# Patient Record
Sex: Male | Born: 1940 | Race: White | Hispanic: No | Smoking: Former smoker
Health system: Southern US, Community
[De-identification: ages and names within clinical notes are randomized; demographics above are authoritative.]

## PROBLEM LIST (undated history)

## (undated) DIAGNOSIS — C649 Malignant neoplasm of unspecified kidney, except renal pelvis: Secondary | ICD-10-CM

## (undated) DIAGNOSIS — I639 Cerebral infarction, unspecified: Secondary | ICD-10-CM

## (undated) DIAGNOSIS — N289 Disorder of kidney and ureter, unspecified: Secondary | ICD-10-CM

## (undated) DIAGNOSIS — N319 Neuromuscular dysfunction of bladder, unspecified: Secondary | ICD-10-CM

## (undated) DIAGNOSIS — I619 Nontraumatic intracerebral hemorrhage, unspecified: Secondary | ICD-10-CM

## (undated) DIAGNOSIS — R4189 Other symptoms and signs involving cognitive functions and awareness: Secondary | ICD-10-CM

## (undated) DIAGNOSIS — I10 Essential (primary) hypertension: Secondary | ICD-10-CM

## (undated) DIAGNOSIS — T8859XA Other complications of anesthesia, initial encounter: Secondary | ICD-10-CM

## (undated) HISTORY — PX: HERNIA REPAIR: SHX51

## (undated) HISTORY — PX: LUMBAR FUSION: SHX111

## (undated) HISTORY — PX: NEPHRECTOMY: SHX65

## (undated) HISTORY — PX: APPENDECTOMY: SHX54

---

## 2003-06-02 DIAGNOSIS — C649 Malignant neoplasm of unspecified kidney, except renal pelvis: Secondary | ICD-10-CM

## 2003-06-02 HISTORY — DX: Malignant neoplasm of unspecified kidney, except renal pelvis: C64.9

## 2003-06-02 HISTORY — PX: NEPHRECTOMY: SHX65

## 2007-01-12 ENCOUNTER — Ambulatory Visit: Payer: Self-pay | Admitting: Unknown Physician Specialty

## 2007-02-10 ENCOUNTER — Other Ambulatory Visit: Payer: Self-pay

## 2007-02-10 ENCOUNTER — Ambulatory Visit: Payer: Self-pay | Admitting: Surgery

## 2007-02-17 ENCOUNTER — Inpatient Hospital Stay: Payer: Self-pay | Admitting: Surgery

## 2007-02-25 ENCOUNTER — Ambulatory Visit: Payer: Self-pay | Admitting: Urology

## 2007-03-08 ENCOUNTER — Ambulatory Visit: Payer: Self-pay | Admitting: Urology

## 2008-03-25 ENCOUNTER — Ambulatory Visit: Payer: Self-pay | Admitting: Family Medicine

## 2009-07-02 ENCOUNTER — Ambulatory Visit: Payer: Self-pay | Admitting: Orthopedic Surgery

## 2009-07-04 ENCOUNTER — Ambulatory Visit: Payer: Self-pay | Admitting: Orthopedic Surgery

## 2009-08-27 ENCOUNTER — Ambulatory Visit: Payer: Self-pay | Admitting: Orthopedic Surgery

## 2009-11-21 ENCOUNTER — Ambulatory Visit: Payer: Self-pay | Admitting: Family Medicine

## 2009-12-11 ENCOUNTER — Ambulatory Visit: Payer: Self-pay | Admitting: Unknown Physician Specialty

## 2009-12-13 ENCOUNTER — Inpatient Hospital Stay: Payer: Self-pay | Admitting: Unknown Physician Specialty

## 2010-01-07 ENCOUNTER — Emergency Department: Payer: Self-pay | Admitting: Emergency Medicine

## 2010-01-13 ENCOUNTER — Emergency Department: Payer: Self-pay | Admitting: Emergency Medicine

## 2010-11-28 IMAGING — CR DG CHEST 1V PORT
1 series · 1 of 1 positions shown · non-contrast
Comparison: none

REASON FOR EXAM: Post-op fever
COMMENTS:

[view not recorded]
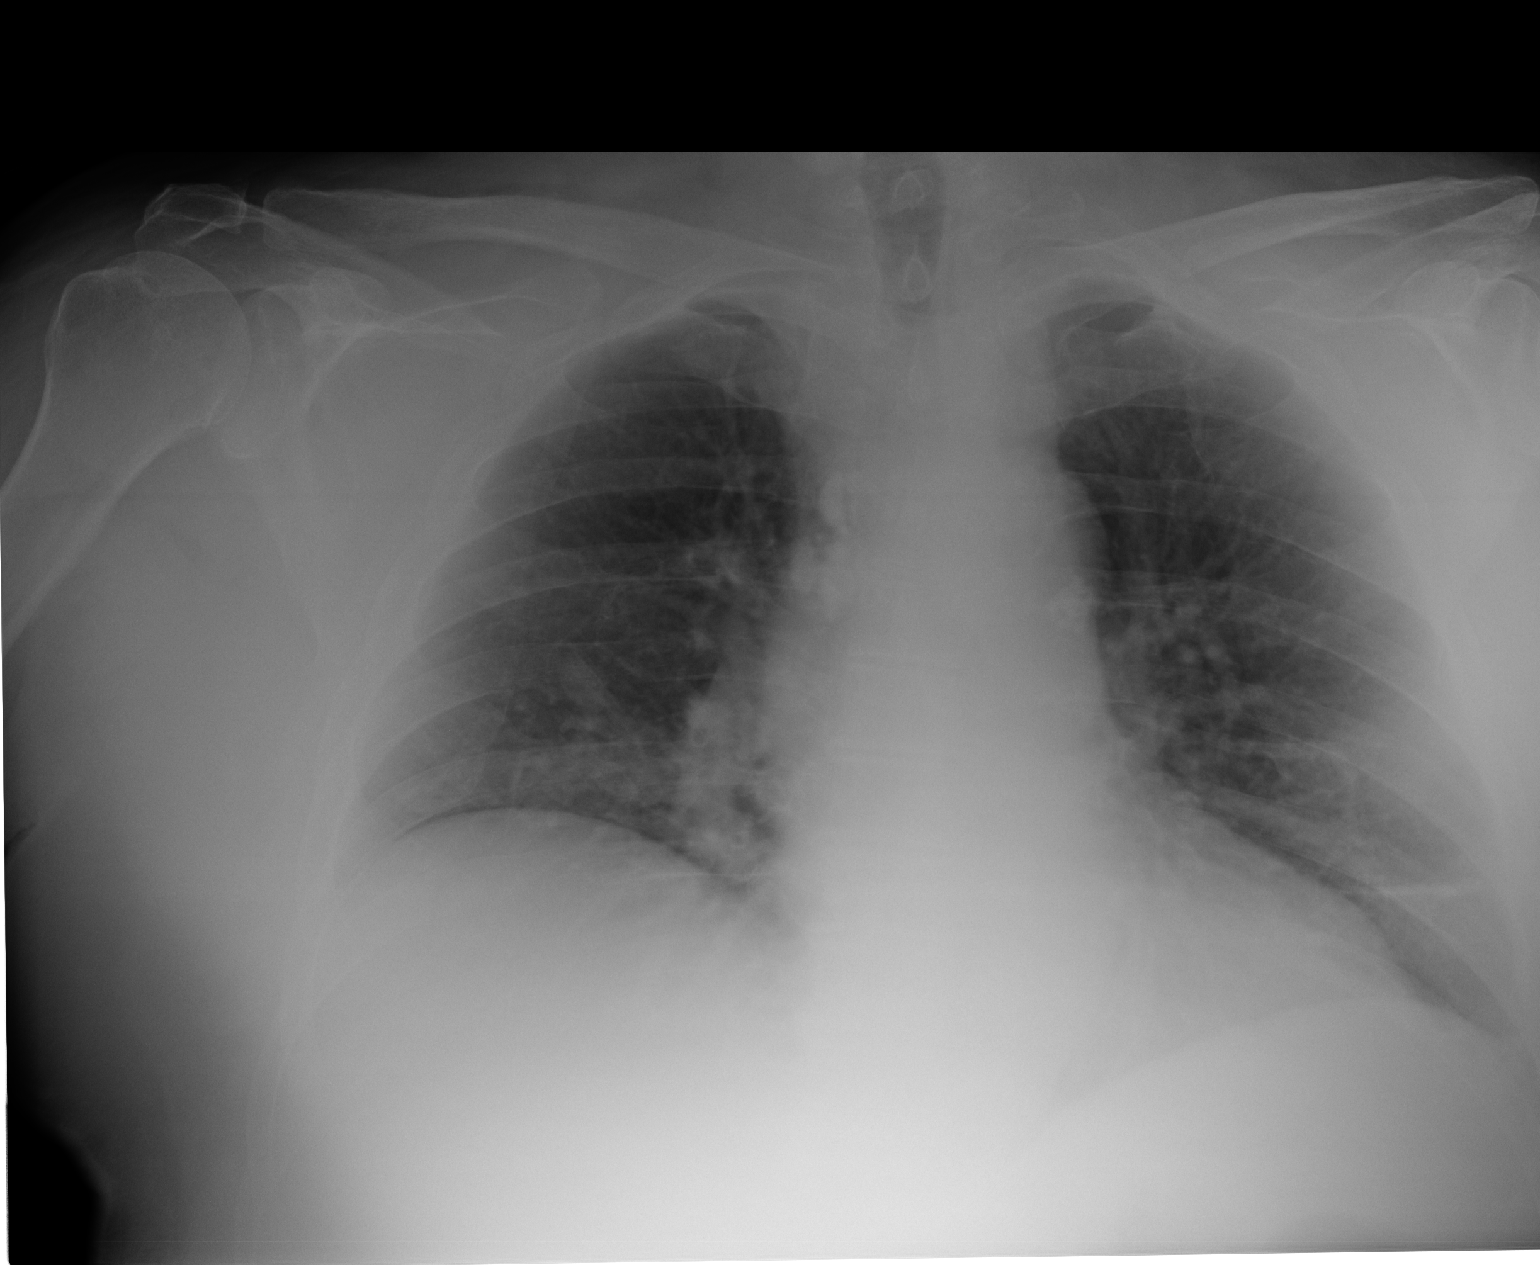

[1 of 1 positions shown; findings below may reference images not displayed]

PROCEDURE:     DXR - DXR PORTABLE CHEST SINGLE VIEW  - December 17, 2009  [DATE]

RESULT:     Comparison is made to a prior exam of 12/16/2009. There is again
noted slight bibasilar atelectasis that appears less prominent than on the
prior exam. No new infiltrates are seen. Calcified lymph nodes are again
noted in the right paratracheal area. No acute changes of the heart or
pulmonary vasculature are noted.
IMPRESSION: 1.  No new pulmonary infiltrates are seen.
2.  There is mild bibasilar atelectasis that appears slightly less prominent
than on the exam of 12/16/2009.

## 2010-11-29 IMAGING — CR DG ABDOMEN 2V
1 series · 4 of 4 positions shown · non-contrast
Comparison: none

REASON FOR EXAM: fever and abd distention
COMMENTS:

[Series 1: view not recorded · 0.17mm/px · 4 of 4 slices shown]
[im 1/4]
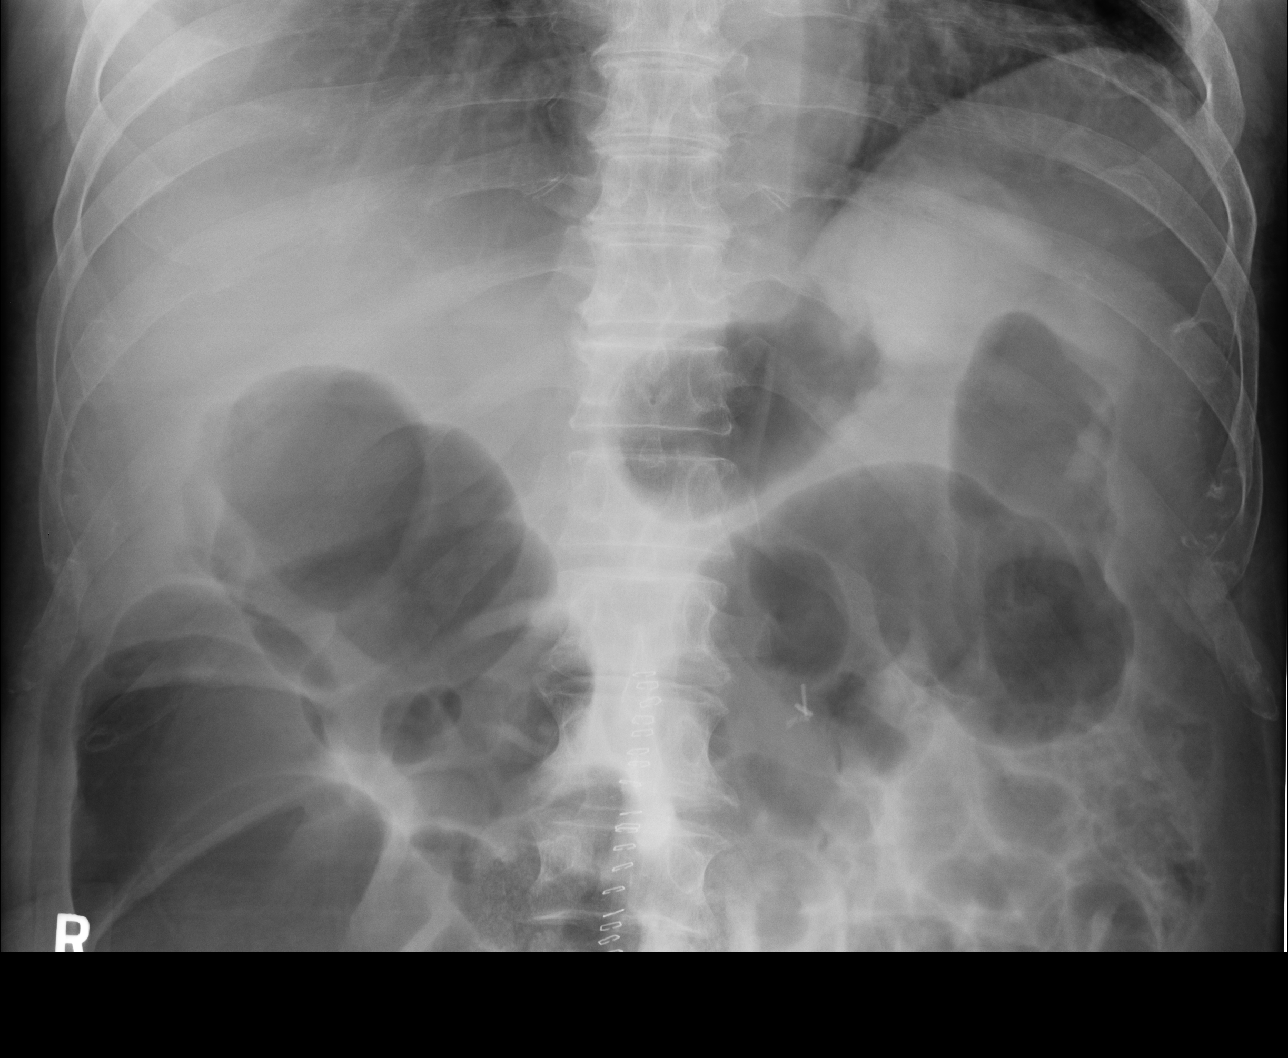
[im 2/4]
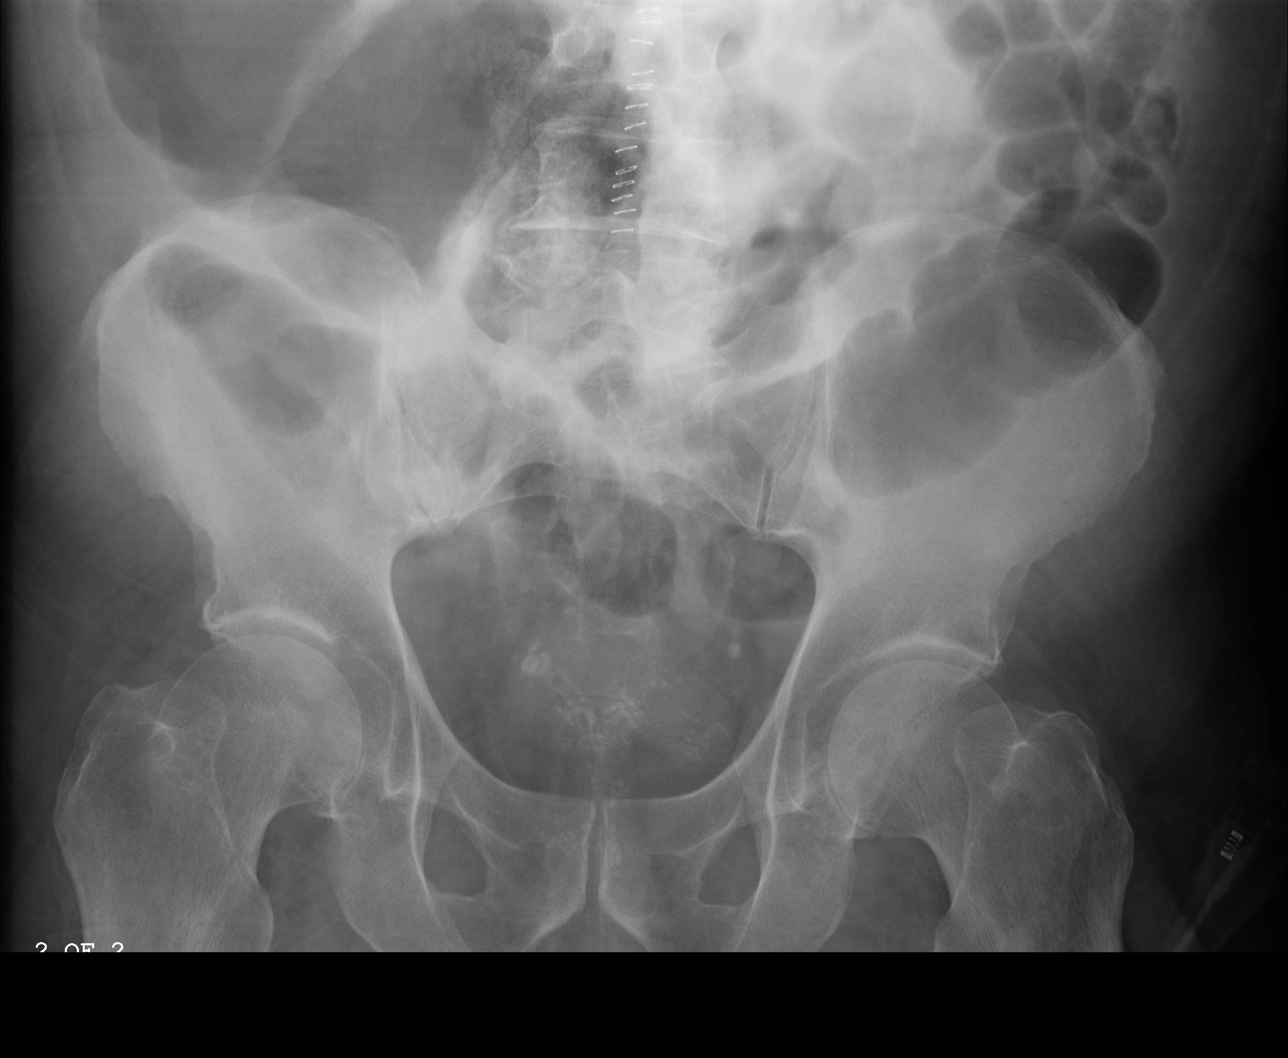
[im 3/4]
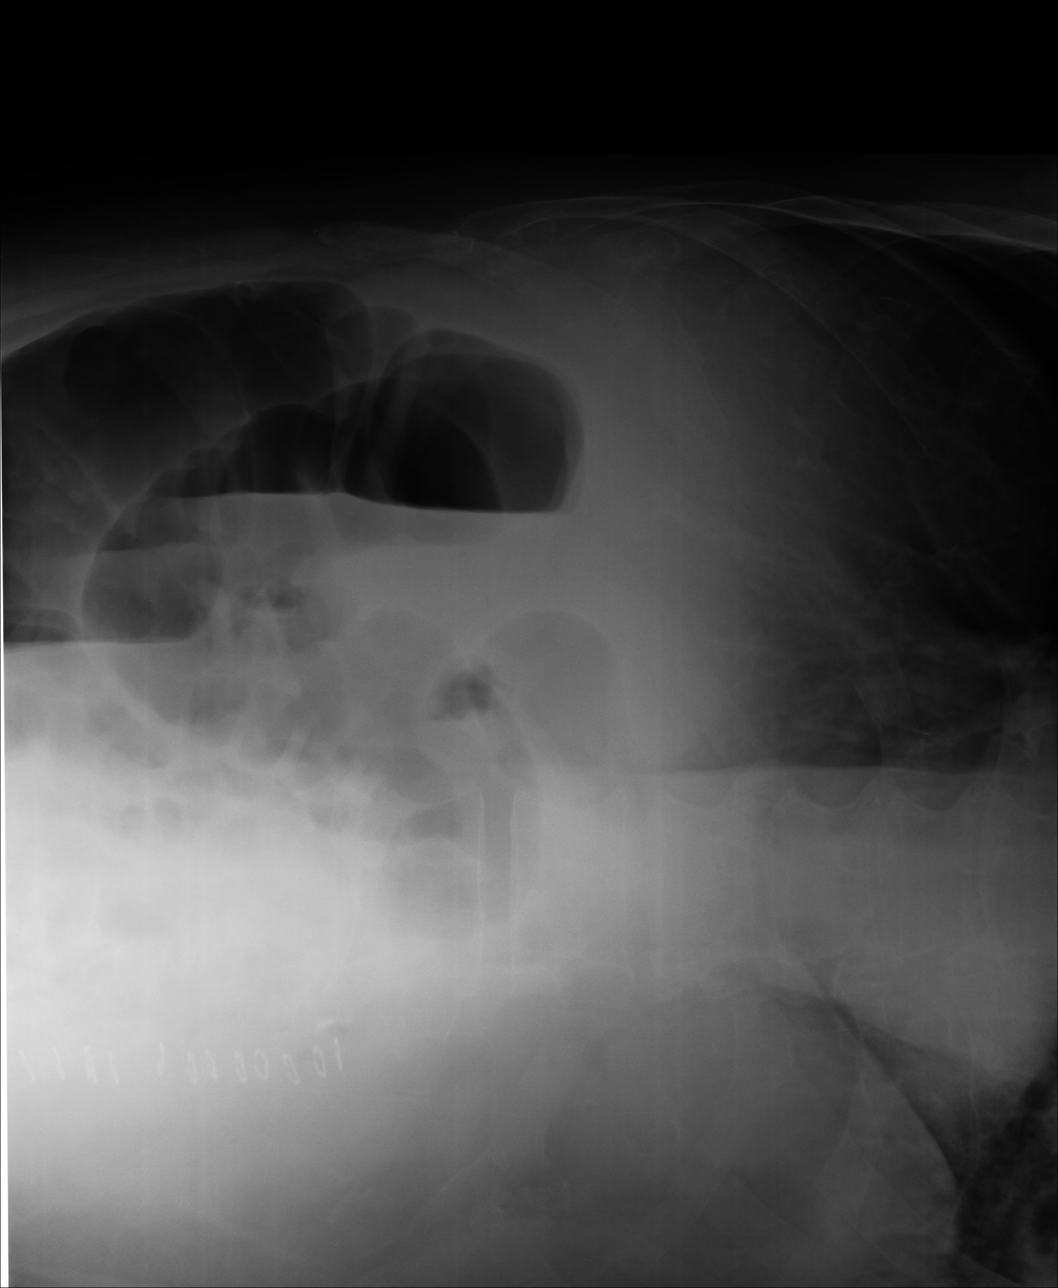
[im 4/4]
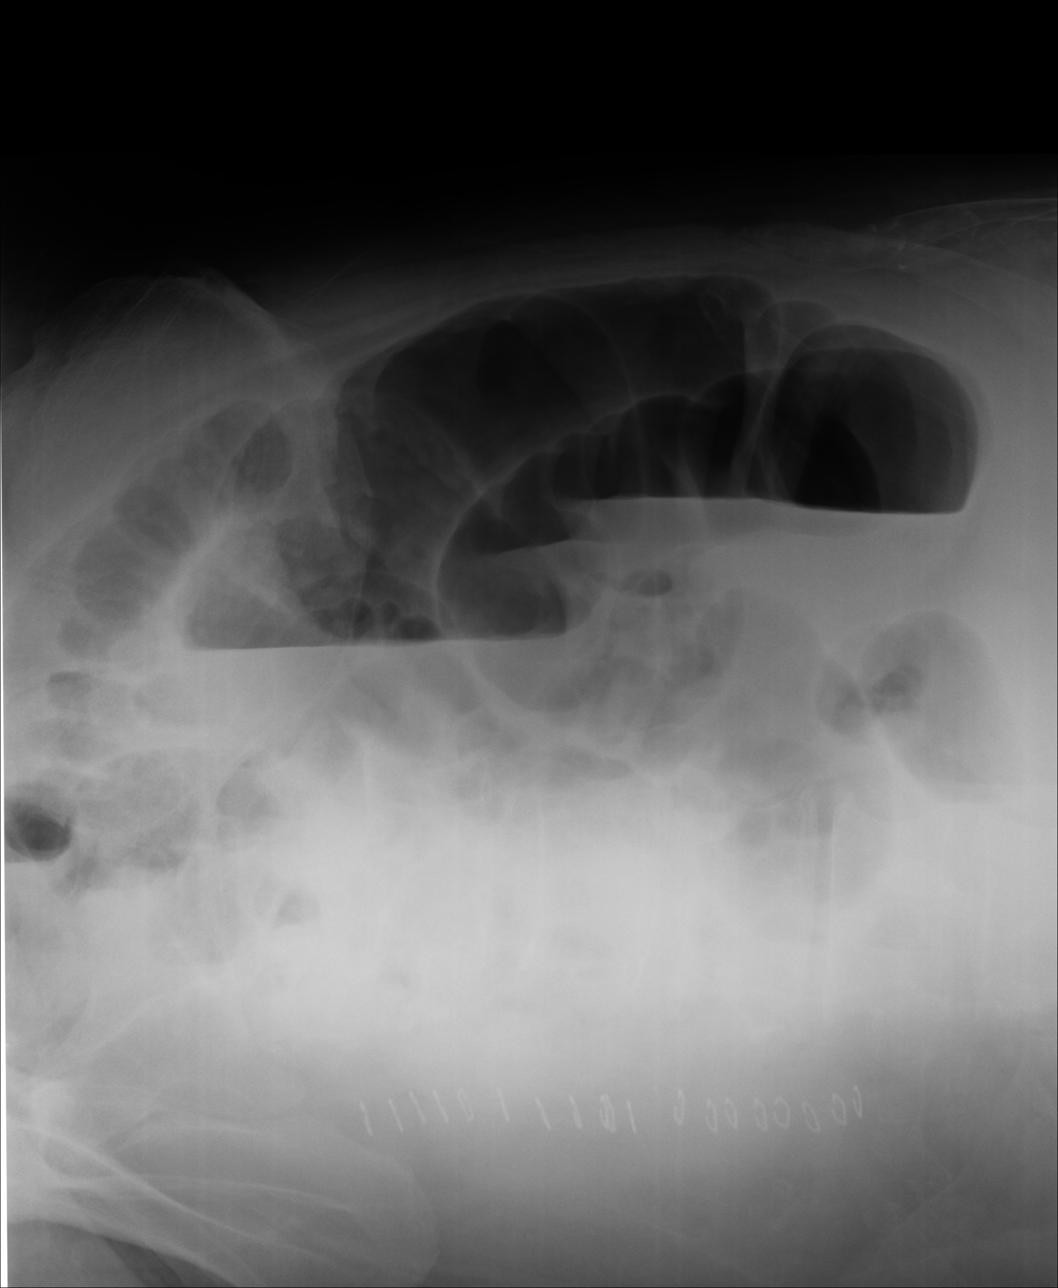

[4 of 4 positions shown; findings below may reference images not displayed]

PROCEDURE:     DXR - DXR ABDOMEN 2 V FLAT AND ERECT  - December 18, 2009  [DATE]

RESULT:     Flat and erect views of the abdomen were obtained. No
subdiaphragmatic free air is seen. There is mild gaseous distention of the
colon with prominent air-fluid levels noted. A few fluid levels are noted in
nondilated small bowel. The findings at this time are most compatible with
an ileus pattern. Postoperative metallic clips are noted in the midline. No
abnormal intraabdominal calcifications are seen.
IMPRESSION: 1. There is colonic dilatation with air-fluid levels present. The findings
are most compatible with an ileus pattern.
2. No subdiaphragmatic free air is seen.

## 2012-10-28 DIAGNOSIS — N390 Urinary tract infection, site not specified: Secondary | ICD-10-CM | POA: Insufficient documentation

## 2013-01-20 DIAGNOSIS — I8390 Asymptomatic varicose veins of unspecified lower extremity: Secondary | ICD-10-CM | POA: Insufficient documentation

## 2013-09-26 DIAGNOSIS — R609 Edema, unspecified: Secondary | ICD-10-CM | POA: Insufficient documentation

## 2013-09-26 DIAGNOSIS — N319 Neuromuscular dysfunction of bladder, unspecified: Secondary | ICD-10-CM | POA: Insufficient documentation

## 2013-09-26 DIAGNOSIS — C649 Malignant neoplasm of unspecified kidney, except renal pelvis: Secondary | ICD-10-CM | POA: Insufficient documentation

## 2013-09-26 DIAGNOSIS — R601 Generalized edema: Secondary | ICD-10-CM | POA: Insufficient documentation

## 2013-09-26 DIAGNOSIS — K649 Unspecified hemorrhoids: Secondary | ICD-10-CM | POA: Insufficient documentation

## 2013-12-26 DIAGNOSIS — M545 Low back pain, unspecified: Secondary | ICD-10-CM | POA: Insufficient documentation

## 2013-12-26 DIAGNOSIS — M4326 Fusion of spine, lumbar region: Secondary | ICD-10-CM | POA: Insufficient documentation

## 2014-04-10 DIAGNOSIS — K592 Neurogenic bowel, not elsewhere classified: Secondary | ICD-10-CM | POA: Insufficient documentation

## 2014-04-10 DIAGNOSIS — K5900 Constipation, unspecified: Secondary | ICD-10-CM | POA: Insufficient documentation

## 2014-04-10 DIAGNOSIS — K439 Ventral hernia without obstruction or gangrene: Secondary | ICD-10-CM | POA: Insufficient documentation

## 2014-07-31 DIAGNOSIS — I10 Essential (primary) hypertension: Secondary | ICD-10-CM | POA: Insufficient documentation

## 2016-01-20 DIAGNOSIS — N390 Urinary tract infection, site not specified: Secondary | ICD-10-CM | POA: Insufficient documentation

## 2017-04-05 ENCOUNTER — Ambulatory Visit (INDEPENDENT_AMBULATORY_CARE_PROVIDER_SITE_OTHER): Payer: Medicare HMO | Admitting: Urology

## 2017-04-05 ENCOUNTER — Encounter: Payer: Self-pay | Admitting: Urology

## 2017-04-05 VITALS — BP 114/76 | HR 109 | Ht 73.0 in | Wt 231.0 lb

## 2017-04-05 DIAGNOSIS — R531 Weakness: Secondary | ICD-10-CM | POA: Insufficient documentation

## 2017-04-05 DIAGNOSIS — R339 Retention of urine, unspecified: Secondary | ICD-10-CM

## 2017-04-05 DIAGNOSIS — N319 Neuromuscular dysfunction of bladder, unspecified: Secondary | ICD-10-CM

## 2017-04-05 DIAGNOSIS — N189 Chronic kidney disease, unspecified: Secondary | ICD-10-CM | POA: Insufficient documentation

## 2017-04-05 DIAGNOSIS — K219 Gastro-esophageal reflux disease without esophagitis: Secondary | ICD-10-CM | POA: Insufficient documentation

## 2017-04-05 DIAGNOSIS — R52 Pain, unspecified: Secondary | ICD-10-CM | POA: Insufficient documentation

## 2017-04-07 ENCOUNTER — Encounter: Payer: Self-pay | Admitting: Urology

## 2017-04-07 ENCOUNTER — Ambulatory Visit (INDEPENDENT_AMBULATORY_CARE_PROVIDER_SITE_OTHER): Payer: Medicare HMO | Admitting: Urology

## 2017-04-07 VITALS — BP 112/76 | HR 96 | Ht 73.0 in

## 2017-04-07 DIAGNOSIS — N319 Neuromuscular dysfunction of bladder, unspecified: Secondary | ICD-10-CM | POA: Diagnosis not present

## 2017-04-07 MED ORDER — CIPROFLOXACIN HCL 500 MG PO TABS
500.0000 mg | ORAL_TABLET | Freq: Once | ORAL | Status: AC
  Administered 2017-04-07: 500 mg via ORAL

## 2017-04-07 MED ORDER — LIDOCAINE HCL 2 % EX GEL
1.0000 "application " | Freq: Once | CUTANEOUS | Status: AC
  Administered 2017-04-07: 1 via URETHRAL

## 2017-04-07 NOTE — Progress Notes (Signed)
04/05/2017 8:32 AM   Adam Rios Feb 02, 1941 [id number]  Referring provider: No referring provider defined for this encounter.  Chief Complaint  Patient presents with  . Self Cath Trauma    HPI: Adam Rios is a 76 year old male who presents with a history of self cath trauma.  He was admitted to [facility] on 03/08/2017 for hypoxia.  He has a history of neurogenic bladder after spinal surgery several years ago and has been on intermittent catheterization for several years.  Review of his [facility] notes indicate he was having difficulty with self-catheterization and having urethral bleeding.  A Foley was placed during that hospitalization and he was discharged with an indwelling Foley.  The patient states in/out catheterization was attempted when in the ED with a straight cath and caused trauma and bleeding.  He states he typically catheterizes with a coud catheter.  He is status post a nephrectomy many years ago for renal cell carcinoma.  A CT of the abdomen and pelvis performed August 2017 showed a surgically absent left kidney and no calculi or hydronephrosis.   PMH: History reviewed. No pertinent past medical history.  Surgical History: History reviewed. No pertinent surgical history.  Home Medications:  Allergies as of 04/05/2017      Reactions   Fentanyl Nausea Only      Medication List        Accurate as of 04/05/17 11:59 PM. Always use your most recent med list.          acetaminophen 325 MG tablet Commonly known as:  TYLENOL Take 650 mg by mouth.   amLODipine 10 MG tablet Commonly known as:  NORVASC   aspirin EC 81 MG tablet Take 81 mg by mouth.   ciprofloxacin 500 MG tablet Commonly known as:  CIPRO Take 500 mg 2 (two) times daily by mouth.   guaiFENesin 100 MG/5ML Soln Commonly known as:  ROBITUSSIN Take 5 mLs every 4 (four) hours as needed by mouth for cough or to loosen phlegm.   losartan 25 MG tablet Commonly known as:  COZAAR Take 25 mg daily by  mouth.   pantoprazole 40 MG tablet Commonly known as:  PROTONIX Take 40 mg daily by mouth.   POLYETHYLENE GLYCOL 3350 PO Take by mouth.   senna 8.6 MG tablet Commonly known as:  SENOKOT Take by mouth.       Allergies:  Allergies  Allergen Reactions  . Fentanyl Nausea Only    Family History: History reviewed. No pertinent family history.  Social History:  reports that he has quit smoking. His smoking use included cigarettes. He has a 2.50 pack-year smoking history. He does not have any smokeless tobacco history on file. His alcohol and drug histories are not on file.  ROS: UROLOGY Frequent Urination?: No Hard to postpone urination?: No Burning/pain with urination?: No Get up at night to urinate?: No Leakage of urine?: No Urine stream starts and stops?: No Trouble starting stream?: No Do you have to strain to urinate?: No Blood in urine?: Yes Urinary tract infection?: Yes Sexually transmitted disease?: No Injury to kidneys or bladder?: No Painful intercourse?: No Weak stream?: No Erection problems?: Yes Penile pain?: Yes  Gastrointestinal Nausea?: No Vomiting?: No Indigestion/heartburn?: No Diarrhea?: No Constipation?: Yes  Constitutional Fever: No Night sweats?: No Weight loss?: No Fatigue?: No  Skin Skin rash/lesions?: No Itching?: No  Eyes Blurred vision?: No Double vision?: No  Ears/Nose/Throat Sore throat?: No Sinus problems?: No  Hematologic/Lymphatic Swollen glands?: No Easy bruising?:  No  Cardiovascular Leg swelling?: No Chest pain?: No  Respiratory Cough?: No Shortness of breath?: No  Endocrine Excessive thirst?: No  Musculoskeletal Back pain?: Yes Joint pain?: Yes  Neurological Headaches?: Yes Dizziness?: No  Psychologic Depression?: No Anxiety?: No  Physical Exam: BP 114/76   Pulse (!) 109   Ht 6\' 1"  (1.854 m) Comment: 03/26/2017 per nursing facility paperwork  Wt 231 lb (104.8 kg) Comment: 03/26/2017 per  nursing facility paperwork  BMI 30.48 kg/m   Constitutional:  Alert and oriented, No acute distress. HEENT: [place] AT, moist mucus membranes.  Trachea midline, no masses. Cardiovascular: No clubbing, cyanosis, or edema. Respiratory: Normal respiratory effort, no increased work of breathing. GI: Abdomen is soft, nontender, nondistended, no abdominal masses GU: No CVA tenderness. Skin: No rashes, bruises or suspicious lesions. Lymph: No cervical or inguinal adenopathy. Neurologic: Grossly intact, no focal deficits, moving all 4 extremities. Psychiatric: Normal mood and affect.   Assessment & Plan:    Neurogenic bladder on intermittent catheterization with recent trauma on in/out catheterization.  He will return for cystoscopy to evaluate for false passage.  If no significant abnormalities will discontinue the Foley and he may resume intermittent catheterization.   Abbie Sons, [place] [address], [place], [place] [address] [phone]

## 2017-04-07 NOTE — Progress Notes (Signed)
   04/07/17  CC:  Chief Complaint  Patient presents with  . Cysto    HPI: Refer to office note 04/05/2017  Blood pressure 112/76, pulse 96, height 6\' 1"  (1.854 m).   Cystoscopy Procedure Note  Patient identification was confirmed, informed consent was obtained, and patient was prepped using Betadine solution.  Lidocaine jelly was administered per urethral meatus.    Preoperative abx where received prior to procedure.     Pre-Procedure: - Inspection reveals a normal caliber ureteral meatus.  Procedure: The flexible cystoscope was introduced without difficulty - No urethral strictures/lesions are present.  No false passage noted - Coapting lateral lobes prostate with a prostatic urethral length with > 5cm. - Elevated bladder neck with an intravesical median lobe - Bilateral ureteral orifices identified - Bladder mucosa  reveals inflammatory change bladder base due to indwelling Foley.  No solid or papillary lesions noted - No bladder stones   Retroflexion shows intravesical median lobe   Post-Procedure: - Patient tolerated the procedure well  Assessment/ Plan:  He was unable to self cath with a 14 [place] coud.  A 16 [place] coud catheter was reinserted without difficulty.  He states he has been on intermittent catheterization for greater than 10 years apparently due to neurogenic bladder after lumbar spinal surgery.  Follow-up 1 week for cath removal and reattempt at self catheterization.

## 2017-04-14 ENCOUNTER — Ambulatory Visit: Payer: Medicare HMO

## 2017-04-15 ENCOUNTER — Ambulatory Visit (INDEPENDENT_AMBULATORY_CARE_PROVIDER_SITE_OTHER): Payer: Medicare HMO

## 2017-04-15 VITALS — BP 148/87 | HR 102 | Ht 73.0 in | Wt 233.0 lb

## 2017-04-15 DIAGNOSIS — N319 Neuromuscular dysfunction of bladder, unspecified: Secondary | ICD-10-CM | POA: Diagnosis not present

## 2017-04-15 NOTE — Progress Notes (Signed)
Pt presented today to have foley removed. Pt tolerated well. Pt has been doing CIC for several years. A false passage was created resulting in foley placement. Coloplast flex pro was given to pt to allow CIC with a coude tip to be easier. Pt demonstrated use of flex pro. Pt tolerated well. Samples were given. Pt will call if he would like an order of flex pro.  Blood pressure (!) 148/87, pulse (!) 102, height 6\' 1"  (1.854 m), weight 233 lb (105.7 kg).

## 2017-04-19 ENCOUNTER — Ambulatory Visit: Payer: Medicare HMO

## 2018-01-29 ENCOUNTER — Inpatient Hospital Stay
Admission: EM | Admit: 2018-01-29 | Discharge: 2018-02-04 | DRG: 698 | Disposition: A | Discharge status: Skilled Nursing Facility | Payer: Medicare HMO | Source: Skilled Nursing Facility | Attending: Internal Medicine | Admitting: Internal Medicine

## 2018-01-29 ENCOUNTER — Emergency Department: Payer: Medicare HMO

## 2018-01-29 ENCOUNTER — Other Ambulatory Visit: Payer: Self-pay

## 2018-01-29 ENCOUNTER — Encounter: Payer: Self-pay | Admitting: Emergency Medicine

## 2018-01-29 DIAGNOSIS — Y846 Urinary catheterization as the cause of abnormal reaction of the patient, or of later complication, without mention of misadventure at the time of the procedure: Secondary | ICD-10-CM | POA: Diagnosis present

## 2018-01-29 DIAGNOSIS — A0472 Enterocolitis due to Clostridium difficile, not specified as recurrent: Secondary | ICD-10-CM | POA: Diagnosis present

## 2018-01-29 DIAGNOSIS — I10 Essential (primary) hypertension: Secondary | ICD-10-CM | POA: Diagnosis present

## 2018-01-29 DIAGNOSIS — T83511A Infection and inflammatory reaction due to indwelling urethral catheter, initial encounter: Secondary | ICD-10-CM

## 2018-01-29 DIAGNOSIS — Z8673 Personal history of transient ischemic attack (TIA), and cerebral infarction without residual deficits: Secondary | ICD-10-CM

## 2018-01-29 DIAGNOSIS — Z905 Acquired absence of kidney: Secondary | ICD-10-CM

## 2018-01-29 DIAGNOSIS — T83518A Infection and inflammatory reaction due to other urinary catheter, initial encounter: Principal | ICD-10-CM | POA: Diagnosis present

## 2018-01-29 DIAGNOSIS — Z85528 Personal history of other malignant neoplasm of kidney: Secondary | ICD-10-CM

## 2018-01-29 DIAGNOSIS — N390 Urinary tract infection, site not specified: Secondary | ICD-10-CM | POA: Diagnosis present

## 2018-01-29 DIAGNOSIS — R1310 Dysphagia, unspecified: Secondary | ICD-10-CM | POA: Diagnosis present

## 2018-01-29 DIAGNOSIS — Z66 Do not resuscitate: Secondary | ICD-10-CM | POA: Diagnosis present

## 2018-01-29 DIAGNOSIS — N319 Neuromuscular dysfunction of bladder, unspecified: Secondary | ICD-10-CM | POA: Diagnosis present

## 2018-01-29 DIAGNOSIS — R4182 Altered mental status, unspecified: Secondary | ICD-10-CM

## 2018-01-29 DIAGNOSIS — G9340 Encephalopathy, unspecified: Secondary | ICD-10-CM | POA: Diagnosis present

## 2018-01-29 DIAGNOSIS — Z87891 Personal history of nicotine dependence: Secondary | ICD-10-CM

## 2018-01-29 DIAGNOSIS — Z79899 Other long term (current) drug therapy: Secondary | ICD-10-CM

## 2018-01-29 DIAGNOSIS — I611 Nontraumatic intracerebral hemorrhage in hemisphere, cortical: Secondary | ICD-10-CM | POA: Diagnosis present

## 2018-01-29 DIAGNOSIS — A419 Sepsis, unspecified organism: Secondary | ICD-10-CM | POA: Diagnosis present

## 2018-01-29 DIAGNOSIS — K219 Gastro-esophageal reflux disease without esophagitis: Secondary | ICD-10-CM | POA: Diagnosis present

## 2018-01-29 DIAGNOSIS — B9689 Other specified bacterial agents as the cause of diseases classified elsewhere: Secondary | ICD-10-CM | POA: Diagnosis present

## 2018-01-29 DIAGNOSIS — G9341 Metabolic encephalopathy: Secondary | ICD-10-CM | POA: Diagnosis present

## 2018-01-29 HISTORY — DX: Nontraumatic intracerebral hemorrhage, unspecified: I61.9

## 2018-01-29 HISTORY — DX: Essential (primary) hypertension: I10

## 2018-01-29 HISTORY — DX: Other symptoms and signs involving cognitive functions and awareness: R41.89

## 2018-01-29 HISTORY — DX: Disorder of kidney and ureter, unspecified: N28.9

## 2018-01-29 HISTORY — DX: Neuromuscular dysfunction of bladder, unspecified: N31.9

## 2018-01-29 HISTORY — DX: Cerebral infarction, unspecified: I63.9

## 2018-01-29 HISTORY — DX: Malignant neoplasm of unspecified kidney, except renal pelvis: C64.9

## 2018-01-29 LAB — COMPREHENSIVE METABOLIC PANEL
ALK PHOS: 55 U/L (ref 38–126)
ALT: 22 U/L (ref 0–44)
ANION GAP: 9 (ref 5–15)
AST: 18 U/L (ref 15–41)
Albumin: 3.7 g/dL (ref 3.5–5.0)
BILIRUBIN TOTAL: 1.1 mg/dL (ref 0.3–1.2)
BUN: 29 mg/dL — AB (ref 8–23)
CALCIUM: 8.9 mg/dL (ref 8.9–10.3)
CO2: 21 mmol/L — ABNORMAL LOW (ref 22–32)
Chloride: 102 mmol/L (ref 98–111)
Creatinine, Ser: 1.03 mg/dL (ref 0.61–1.24)
GFR calc Af Amer: >60 mL/min (ref >60)
Glucose, Bld: 109 mg/dL — ABNORMAL HIGH (ref 70–99)
POTASSIUM: 4.2 mmol/L (ref 3.5–5.1)
Sodium: 132 mmol/L — ABNORMAL LOW (ref 135–145)
TOTAL PROTEIN: 7.3 g/dL (ref 6.5–8.1)

## 2018-01-29 LAB — CBC
HEMATOCRIT: 45.3 % (ref 40.0–52.0)
Hemoglobin: 15.2 g/dL (ref 13.0–18.0)
MCH: 29.2 pg (ref 26.0–34.0)
MCHC: 33.6 g/dL (ref 32.0–36.0)
MCV: 86.8 fL (ref 80.0–100.0)
Platelets: 286 10*3/uL (ref 150–440)
RBC: 5.21 MIL/uL (ref 4.40–5.90)
RDW: 14 % (ref 11.5–14.5)
WBC: 21.5 10*3/uL — ABNORMAL HIGH (ref 3.8–10.6)

## 2018-01-29 LAB — DIFFERENTIAL
BASOS PCT: 0 %
Basophils Absolute: 0.1 10*3/uL (ref 0–0.1)
EOS ABS: 0.1 10*3/uL (ref 0–0.7)
EOS PCT: 1 %
LYMPHS ABS: 2.4 10*3/uL (ref 1.0–3.6)
Lymphocytes Relative: 11 %
MONO ABS: 1.8 10*3/uL — AB (ref 0.2–1.0)
MONOS PCT: 8 %
Neutro Abs: 17.1 10*3/uL — ABNORMAL HIGH (ref 1.4–6.5)
Neutrophils Relative %: 80 %

## 2018-01-29 LAB — PROTIME-INR
INR: 1.08
Prothrombin Time: 13.9 seconds (ref 11.4–15.2)

## 2018-01-29 LAB — APTT: APTT: 41 s — AB (ref 24–36)

## 2018-01-29 LAB — TROPONIN I: TROPONIN I: 0.03 ng/mL — AB (ref <0.03)

## 2018-01-29 NOTE — ED Triage Notes (Signed)
EMS pt to rm 25 from [facility] with report of AMS over the last 3 days. Pt has a stroke a few weeks ago.

## 2018-01-30 ENCOUNTER — Other Ambulatory Visit: Payer: Self-pay

## 2018-01-30 ENCOUNTER — Encounter: Payer: Self-pay | Admitting: Emergency Medicine

## 2018-01-30 DIAGNOSIS — Z8673 Personal history of transient ischemic attack (TIA), and cerebral infarction without residual deficits: Secondary | ICD-10-CM | POA: Diagnosis not present

## 2018-01-30 DIAGNOSIS — R310 Gross hematuria: Secondary | ICD-10-CM | POA: Diagnosis not present

## 2018-01-30 DIAGNOSIS — B9689 Other specified bacterial agents as the cause of diseases classified elsewhere: Secondary | ICD-10-CM | POA: Diagnosis present

## 2018-01-30 DIAGNOSIS — R338 Other retention of urine: Secondary | ICD-10-CM

## 2018-01-30 DIAGNOSIS — R1310 Dysphagia, unspecified: Secondary | ICD-10-CM | POA: Diagnosis present

## 2018-01-30 DIAGNOSIS — N390 Urinary tract infection, site not specified: Secondary | ICD-10-CM | POA: Diagnosis present

## 2018-01-30 DIAGNOSIS — Z87891 Personal history of nicotine dependence: Secondary | ICD-10-CM | POA: Diagnosis not present

## 2018-01-30 DIAGNOSIS — K219 Gastro-esophageal reflux disease without esophagitis: Secondary | ICD-10-CM | POA: Diagnosis present

## 2018-01-30 DIAGNOSIS — N319 Neuromuscular dysfunction of bladder, unspecified: Secondary | ICD-10-CM | POA: Diagnosis not present

## 2018-01-30 DIAGNOSIS — Z85528 Personal history of other malignant neoplasm of kidney: Secondary | ICD-10-CM | POA: Diagnosis not present

## 2018-01-30 DIAGNOSIS — Z905 Acquired absence of kidney: Secondary | ICD-10-CM | POA: Diagnosis not present

## 2018-01-30 DIAGNOSIS — G9340 Encephalopathy, unspecified: Secondary | ICD-10-CM | POA: Diagnosis present

## 2018-01-30 DIAGNOSIS — Z79899 Other long term (current) drug therapy: Secondary | ICD-10-CM | POA: Diagnosis not present

## 2018-01-30 DIAGNOSIS — A0472 Enterocolitis due to Clostridium difficile, not specified as recurrent: Secondary | ICD-10-CM | POA: Diagnosis present

## 2018-01-30 DIAGNOSIS — I611 Nontraumatic intracerebral hemorrhage in hemisphere, cortical: Secondary | ICD-10-CM | POA: Diagnosis present

## 2018-01-30 DIAGNOSIS — R4182 Altered mental status, unspecified: Secondary | ICD-10-CM | POA: Diagnosis not present

## 2018-01-30 DIAGNOSIS — Z66 Do not resuscitate: Secondary | ICD-10-CM | POA: Diagnosis present

## 2018-01-30 DIAGNOSIS — A419 Sepsis, unspecified organism: Secondary | ICD-10-CM | POA: Diagnosis present

## 2018-01-30 DIAGNOSIS — G9341 Metabolic encephalopathy: Secondary | ICD-10-CM | POA: Diagnosis present

## 2018-01-30 DIAGNOSIS — Y846 Urinary catheterization as the cause of abnormal reaction of the patient, or of later complication, without mention of misadventure at the time of the procedure: Secondary | ICD-10-CM | POA: Diagnosis present

## 2018-01-30 DIAGNOSIS — I10 Essential (primary) hypertension: Secondary | ICD-10-CM | POA: Diagnosis present

## 2018-01-30 DIAGNOSIS — T83518A Infection and inflammatory reaction due to other urinary catheter, initial encounter: Secondary | ICD-10-CM | POA: Diagnosis present

## 2018-01-30 LAB — MRSA PCR SCREENING: MRSA by PCR: POSITIVE — AB

## 2018-01-30 LAB — BASIC METABOLIC PANEL
ANION GAP: 9 (ref 5–15)
BUN: 29 mg/dL — ABNORMAL HIGH (ref 8–23)
CHLORIDE: 102 mmol/L (ref 98–111)
CO2: 23 mmol/L (ref 22–32)
Calcium: 8.7 mg/dL — ABNORMAL LOW (ref 8.9–10.3)
Creatinine, Ser: 1.01 mg/dL (ref 0.61–1.24)
GFR calc Af Amer: >60 mL/min (ref >60)
GFR calc non Af Amer: >60 mL/min (ref >60)
GLUCOSE: 104 mg/dL — AB (ref 70–99)
POTASSIUM: 3.9 mmol/L (ref 3.5–5.1)
Sodium: 134 mmol/L — ABNORMAL LOW (ref 135–145)

## 2018-01-30 LAB — CBC
HEMATOCRIT: 43.8 % (ref 40.0–52.0)
HEMOGLOBIN: 15.1 g/dL (ref 13.0–18.0)
MCH: 29.8 pg (ref 26.0–34.0)
MCHC: 34.3 g/dL (ref 32.0–36.0)
MCV: 86.7 fL (ref 80.0–100.0)
PLATELETS: 251 10*3/uL (ref 150–440)
RBC: 5.06 MIL/uL (ref 4.40–5.90)
RDW: 13.9 % (ref 11.5–14.5)
WBC: 18.1 10*3/uL — ABNORMAL HIGH (ref 3.8–10.6)

## 2018-01-30 LAB — URINALYSIS, COMPLETE (UACMP) WITH MICROSCOPIC
BILIRUBIN URINE: NEGATIVE
Glucose, UA: NEGATIVE mg/dL
Ketones, ur: NEGATIVE mg/dL
Nitrite: NEGATIVE
PH: 5 (ref 5.0–8.0)
Protein, ur: 30 mg/dL — AB
RBC / HPF: >50 RBC/hpf — ABNORMAL HIGH (ref 0–5)
SPECIFIC GRAVITY, URINE: 1.023 (ref 1.005–1.030)

## 2018-01-30 LAB — LACTIC ACID, PLASMA: Lactic Acid, Venous: 1 mmol/L (ref 0.5–1.9)

## 2018-01-30 LAB — GLUCOSE, CAPILLARY: Glucose-Capillary: 94 mg/dL (ref 70–99)

## 2018-01-30 MED ORDER — CHLORHEXIDINE GLUCONATE CLOTH 2 % EX PADS
6.0000 | MEDICATED_PAD | Freq: Every day | CUTANEOUS | Status: AC
  Administered 2018-01-31: 6 via TOPICAL

## 2018-01-30 MED ORDER — LOSARTAN POTASSIUM 25 MG PO TABS
50.00 | ORAL_TABLET | ORAL | Status: DC
Start: 2018-01-28 — End: 2018-01-30

## 2018-01-30 MED ORDER — ACETAMINOPHEN 325 MG PO TABS
650.0000 mg | ORAL_TABLET | Freq: Four times a day (QID) | ORAL | Status: DC | PRN
  Administered 2018-02-03: 650 mg via ORAL
  Filled 2018-01-30: qty 2

## 2018-01-30 MED ORDER — BISACODYL 5 MG PO TBEC: 5.0000 mg | DELAYED_RELEASE_TABLET | Freq: Every day | ORAL | Status: DC | PRN

## 2018-01-30 MED ORDER — TIZANIDINE HCL 2 MG PO TABS
2.00 | ORAL_TABLET | ORAL | Status: DC
Start: ? — End: 2018-01-30

## 2018-01-30 MED ORDER — SODIUM CHLORIDE 0.9 % IV SOLN
1.0000 g | Freq: Two times a day (BID) | INTRAVENOUS | Status: DC
  Administered 2018-01-30 – 2018-01-31 (×3): 1 g via INTRAVENOUS
  Filled 2018-01-30 (×5): qty 1

## 2018-01-30 MED ORDER — AMLODIPINE BESYLATE 10 MG PO TABS
10.0000 mg | ORAL_TABLET | Freq: Every day | ORAL | Status: DC
  Administered 2018-01-31 – 2018-02-04 (×5): 10 mg via ORAL
  Filled 2018-01-30 (×5): qty 1

## 2018-01-30 MED ORDER — LOSARTAN POTASSIUM 50 MG PO TABS
50.0000 mg | ORAL_TABLET | Freq: Every day | ORAL | Status: DC
  Administered 2018-01-31 – 2018-02-04 (×5): 50 mg via ORAL
  Filled 2018-01-30 (×5): qty 1

## 2018-01-30 MED ORDER — SODIUM CHLORIDE 0.9 % IV SOLN
2.0000 g | Freq: Once | INTRAVENOUS | Status: AC
  Administered 2018-01-30: 2 g via INTRAVENOUS
  Filled 2018-01-30: qty 2

## 2018-01-30 MED ORDER — SODIUM CHLORIDE 0.9 % IV SOLN
Freq: Once | INTRAVENOUS | Status: AC
  Administered 2018-01-30: 04:00:00 via INTRAVENOUS

## 2018-01-30 MED ORDER — GUAIFENESIN 100 MG/5ML PO SYRP
200.00 | ORAL_SOLUTION | ORAL | Status: DC
Start: ? — End: 2018-01-30

## 2018-01-30 MED ORDER — MUPIROCIN 2 % EX OINT
1.0000 "application " | TOPICAL_OINTMENT | Freq: Two times a day (BID) | CUTANEOUS | Status: AC
  Administered 2018-01-30 – 2018-02-03 (×10): 1 via NASAL
  Filled 2018-01-30: qty 22

## 2018-01-30 MED ORDER — OXYBUTYNIN CHLORIDE 5 MG PO TABS
5.00 | ORAL_TABLET | ORAL | Status: DC
Start: 2018-01-27 — End: 2018-01-30

## 2018-01-30 MED ORDER — POLYVINYL ALCOHOL 1.4 % OP SOLN
1.0000 [drp] | Freq: Four times a day (QID) | OPHTHALMIC | Status: DC | PRN
  Filled 2018-01-30: qty 15

## 2018-01-30 MED ORDER — TRAZODONE HCL 50 MG PO TABS
25.0000 mg | ORAL_TABLET | Freq: Every evening | ORAL | Status: DC | PRN
  Administered 2018-02-03: 25 mg via ORAL
  Filled 2018-01-30 (×2): qty 1

## 2018-01-30 MED ORDER — ONDANSETRON HCL 4 MG PO TABS: 4.0000 mg | ORAL_TABLET | Freq: Four times a day (QID) | ORAL | Status: DC | PRN

## 2018-01-30 MED ORDER — ACETAMINOPHEN 650 MG RE SUPP: 650.0000 mg | Freq: Four times a day (QID) | RECTAL | Status: DC | PRN

## 2018-01-30 MED ORDER — DOCUSATE SODIUM 100 MG PO CAPS
100.00 | ORAL_CAPSULE | ORAL | Status: DC
Start: 2018-01-27 — End: 2018-01-30

## 2018-01-30 MED ORDER — PANTOPRAZOLE SODIUM 40 MG PO TBEC
40.0000 mg | DELAYED_RELEASE_TABLET | Freq: Every day | ORAL | Status: DC
  Administered 2018-01-31 – 2018-02-04 (×5): 40 mg via ORAL
  Filled 2018-01-30 (×5): qty 1

## 2018-01-30 MED ORDER — BACLOFEN 10 MG PO TABS
5.00 | ORAL_TABLET | ORAL | Status: DC
Start: 2018-01-27 — End: 2018-01-30

## 2018-01-30 MED ORDER — ONDANSETRON HCL 4 MG/2ML IJ SOLN: 4.0000 mg | Freq: Four times a day (QID) | INTRAMUSCULAR | Status: DC | PRN

## 2018-01-30 MED ORDER — GENERIC EXTERNAL MEDICATION
4.00 | Status: DC
Start: ? — End: 2018-01-30

## 2018-01-30 MED ORDER — HEPARIN SODIUM (PORCINE) 5000 UNIT/ML IJ SOLN: 5000.0000 [IU] | Freq: Three times a day (TID) | INTRAMUSCULAR | Status: DC

## 2018-01-30 MED ORDER — AMLODIPINE BESYLATE 10 MG PO TABS
10.00 | ORAL_TABLET | ORAL | Status: DC
Start: 2018-01-28 — End: 2018-01-30

## 2018-01-30 MED ORDER — DOCUSATE SODIUM 100 MG PO CAPS
100.0000 mg | ORAL_CAPSULE | Freq: Two times a day (BID) | ORAL | Status: DC
  Administered 2018-01-31 – 2018-02-02 (×5): 100 mg via ORAL
  Filled 2018-01-30 (×8): qty 1

## 2018-01-30 MED ORDER — HYDROCODONE-ACETAMINOPHEN 5-325 MG PO TABS
1.0000 | ORAL_TABLET | ORAL | Status: DC | PRN
  Administered 2018-01-31: 2 via ORAL
  Administered 2018-02-02 – 2018-02-04 (×2): 1 via ORAL
  Filled 2018-01-30: qty 2
  Filled 2018-01-30 (×2): qty 1

## 2018-01-30 MED ORDER — CARBOXYMETHYLCELLULOSE SODIUM 0.25 % OP SOLN
2.00 | OPHTHALMIC | Status: DC
Start: ? — End: 2018-01-30

## 2018-01-30 MED ORDER — ENOXAPARIN SODIUM 40 MG/0.4ML ~~LOC~~ SOLN
40.00 | SUBCUTANEOUS | Status: DC
Start: 2018-01-28 — End: 2018-01-30

## 2018-01-30 MED ORDER — SODIUM CHLORIDE 0.9 % IV SOLN
INTRAVENOUS | Status: DC
  Administered 2018-01-30 – 2018-02-02 (×6): via INTRAVENOUS

## 2018-01-30 MED ORDER — HYDRALAZINE HCL 20 MG/ML IJ SOLN
10.00 | INTRAMUSCULAR | Status: DC
Start: ? — End: 2018-01-30

## 2018-01-30 MED ORDER — MORPHINE SULFATE (PF) 2 MG/ML IV SOLN
2.0000 mg | Freq: Four times a day (QID) | INTRAVENOUS | Status: DC | PRN
  Administered 2018-01-30: 2 mg via INTRAVENOUS
  Filled 2018-01-30: qty 1

## 2018-01-30 MED ORDER — ACETAMINOPHEN 325 MG PO TABS
650.00 | ORAL_TABLET | ORAL | Status: DC
Start: 2018-01-27 — End: 2018-01-30

## 2018-01-30 NOTE — ED Notes (Signed)
Updated report called to [place], RN on 2A.

## 2018-01-30 NOTE — ED Notes (Signed)
Ann RN, aware of bed assigned 

## 2018-01-30 NOTE — Progress Notes (Signed)
Pt has history of neurogenic bladder, new foley placed today, no urine return, bladder scan completed showing less than 150 ml urine. Pt becoming more alert and oriented throughout this shift. Will continue to monitor at this time.

## 2018-01-30 NOTE — ED Provider Notes (Addendum)
[facility] Emergency Department Provider Note   ____________________________________________   First MD Initiated Contact with Patient 01/29/18 2329     (approximate)  I have reviewed the triage vital signs and the nursing notes.   HISTORY  Chief Complaint Altered Mental Status  Patient with some altered mental status and unable to provide history  HPI Adam Rios is a 77 y.o. male who is here from [facility] with some altered mental status.  According to EMS the patient had a recent stroke.  He has been at [facility] but it was altered for the past 3 days.  Normally the patient is able to walk but he has been unable to do so when he is not fully answering questions.  He also has to self catheterize himself normally.  The staff was unsure what was going on with the patient so they sent him into the hospital for evaluation.  The patient does have a low-grade temperature.   Past Medical History:  Diagnosis Date  . Cognitive impairment   . 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 Hypertension   . Neurogenic bladder   . Renal cell carcinoma ([place])   . Renal disorder   . Stroke [facility])     Patient Active Problem List   Diagnosis Date Noted  . Acute encephalopathy 01/30/2018  . Chronic kidney disease 04/05/2017  . GERD (gastroesophageal reflux disease) 04/05/2017  . Pain 04/05/2017  . Weakness 04/05/2017  . Essential hypertension 07/31/2014  . 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 Ventral hernia without obstruction or gangrene 04/10/2014  . Acute low back pain without sciatica 12/26/2013  . Fusion of spine of lumbar region 12/26/2013  . Edema 09/26/2013  . Hemorrhoid 09/26/2013  . Neurogenic bladder 09/26/2013  . Renal cell cancer ([place]) 09/26/2013  . 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 Lower urinary tract infectious disease 10/28/2012    Past Surgical History:  Procedure Laterality Date  . APPENDECTOMY    . HERNIA  REPAIR    . LUMBAR FUSION    . NEPHRECTOMY      Prior to Admission medications   Medication Sig Start Date End Date Taking? Authorizing Provider  amLODipine (NORVASC) 10 MG tablet Take 10 mg by mouth daily.  02/18/17  Yes [provider]  baclofen (LIORESAL) 10 MG tablet Take 5 mg by mouth 3 (three) times daily.   Yes [provider]  carboxymethylcellulose (REFRESH PLUS) 0.5 % SOLN Apply 1 drop to eye 4 (four) times daily as needed.   Yes [provider]  docusate sodium (COLACE) 100 MG capsule Take 100 mg by mouth 2 (two) times daily.   Yes [provider]  losartan (COZAAR) 25 MG tablet Take 50 mg by mouth daily.  03/27/17 01/29/18 Yes [provider]  oxybutynin (DITROPAN-XL) 5 MG 24 hr tablet Take 5 mg by mouth daily.   Yes [provider]  senna (SENOKOT) 8.6 MG tablet Take by mouth.   Yes [provider]  tizanidine (ZANAFLEX) 2 MG capsule Take 2 mg by mouth every 6 (six) hours as needed for muscle spasms.   Yes [provider]  acetaminophen (TYLENOL) 325 MG tablet Take 650 mg by mouth. 08/14/14   [provider]  aspirin EC 81 MG tablet Take 81 mg by mouth.    [provider]  guaiFENesin (ROBITUSSIN) 100 MG/5ML SOLN Take 5 mLs every 4 (four) hours as needed by mouth for cough or to loosen phlegm.  [provider]  pantoprazole (PROTONIX) 40 MG tablet Take 40 mg daily by mouth.    [provider]  POLYETHYLENE GLYCOL 3350 PO Take by mouth.    [provider]    Allergies Fentanyl  History reviewed. No pertinent family history.  Social History Social History   Tobacco Use  . Smoking status: Former Smoker    Packs/day: 0.25    Years: 10.00    Pack years: 2.50    Types: Cigarettes  . Smokeless tobacco: Never Used  Substance Use Topics  . Alcohol use: Not Currently  . Drug use: Never    Review of Systems  Unable to assess due to patient altered mental  status   ____________________________________________   PHYSICAL EXAM:  VITAL SIGNS: ED Triage Vitals  Enc Vitals Group     BP 01/29/18 2246 104/71     Pulse Rate 01/29/18 2246 94     Resp 01/29/18 2246 (!) 22     Temp 01/29/18 2246 100 F (37.8 C)     Temp Source 01/29/18 2246 Rectal     SpO2 01/29/18 2246 95 %     Weight 01/29/18 2249 196 lb 3.4 oz (89 kg)     Height 01/29/18 2249 6' (1.829 m)     Head Circumference --      [place] --      Pain Score --      Pain Loc --      Pain Edu? --      Excl. in [place]? --     Constitutional: Alert and oriented to self, patient slow to respond and unable to say the date or where he is located. no acute distress. Eyes: Conjunctivae are normal. PERRL. EOMI. Head: Atraumatic. Nose: No congestion/rhinnorhea. Mouth/Throat: Mucous membranes are moist.  Oropharynx non-erythematous. Cardiovascular: Normal rate, regular rhythm. Grossly normal heart sounds.  Good peripheral circulation. Respiratory: Normal respiratory effort.  No retractions. Gastrointestinal: Soft and nontender. No distention. Positive bowel sounds Musculoskeletal: No lower extremity tenderness nor edema.   Neurologic:  Patient slow to respond and unable to fully follow commands, patient with some diminished strength on the right upper extremity, denies sensory deficit Skin:  Skin is warm, dry and intact.  Psychiatric: Mood and affect are normal.   ____________________________________________   LABS (all labs ordered are listed, but only abnormal results are displayed)  Labs Reviewed  APTT - Abnormal; Notable for the following components:      Result Value   aPTT 41 (*)    All other components within normal limits  CBC - Abnormal; Notable for the following components:   WBC 21.5 (*)    All other components within normal limits  DIFFERENTIAL - Abnormal; Notable for the following components:   Neutro Abs 17.1 (*)    Monocytes Absolute 1.8 (*)    All other  components within normal limits  COMPREHENSIVE METABOLIC PANEL - Abnormal; Notable for the following components:   Sodium 132 (*)    CO2 21 (*)    Glucose, Bld 109 (*)    BUN 29 (*)    All other components within normal limits  TROPONIN I - Abnormal; Notable for the following components:   Troponin I 0.03 (*)    All other components within normal limits  URINALYSIS, COMPLETE (UACMP) WITH MICROSCOPIC - Abnormal; Notable for the following components:   Color, Urine YELLOW (*)    APPearance CLOUDY (*)    Hgb urine dipstick LARGE (*)    Protein,  ur 30 (*)    Leukocytes, UA SMALL (*)    RBC / HPF >50 (*)    Bacteria, UA MANY (*)    All other components within normal limits  CULTURE, BLOOD (ROUTINE X 2)  CULTURE, BLOOD (ROUTINE X 2)  URINE CULTURE  PROTIME-INR  LACTIC ACID, PLASMA  CBG MONITORING, ED   ____________________________________________  EKG  ED ECG REPORT I, Loney Hering, the attending physician, personally viewed and interpreted this ECG.   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  Severe chronic small vessel ischemic changes.  Official radiology report(s): Dg Chest 2 View  Result Date: 01/30/2018 CLINICAL DATA:  Altered mental status EXAM: CHEST - 2 VIEW COMPARISON:  Chest radiograph 12/17/2009 FINDINGS: Low lung volumes. Stable cardiac and mediastinal contours. Tortuous thoracic aorta. Bibasilar atelectasis. No large area pulmonary consolidation. No pleural effusion pneumothorax. Thoracic spine degenerative changes. IMPRESSION: No acute  cardiopulmonary process. Electronically Signed   By: Lovey Newcomer M.D.   On: 01/30/2018 00:33   Ct Head Wo Contrast  Result Date: 01/29/2018 CLINICAL DATA:  Altered mental status for 3 days.  Recent stroke. EXAM: CT HEAD WITHOUT CONTRAST TECHNIQUE: Contiguous axial images were obtained from the base of the skull through the vertex without intravenous contrast. COMPARISON:  None. FINDINGS: BRAIN: 2.5 x 2.6 x 3.8 cm (volume = 13 cc) RIGHT occipital lobe mildly dense hematoma with surrounding low-density vasogenic edema. Effaced RIGHT occipital horn without intraventricular extension. Regional mass effect without midline shift. Moderate to severe parenchymal brain volume loss with disproportion sulcal effacement at the convexities associated with lobulated ventricular margin and narrowed callosum angle. Confluent supratentorial white matter hypodensities. No acute large vascular territory infarct. No abnormal extra-axial fluid collections. Basal cisterns are patent. VASCULAR: Mild calcific atherosclerosis of the carotid siphons. Dolichoectatic intracranial vessels seen with chronic hypertension. SKULL: No skull fracture. No significant scalp soft tissue swelling. SINUSES/ORBITS: Trace paranasal sinus mucosal thickening. Mastoid air cells are well aerated.The included ocular globes and orbital contents are non-suspicious. OTHER: None. IMPRESSION: 1. 13 cc RIGHT occipital lobe subacute appearing hematoma. Regional mass effect without midline shift. 2. 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 3. Severe chronic small vessel ischemic changes. 4. Critical Value/emergent results were called by telephone at the time of interpretation on 01/29/2018 at 11:49 pm to Dr. Charlesetta Ivory , who verbally acknowledged these results. Electronically Signed   By: Elon Alas M.D.   On: 01/29/2018 23:52     ____________________________________________   PROCEDURES  Procedure(s) performed: None  Procedures  Critical Care performed: No  ____________________________________________   INITIAL IMPRESSION / ASSESSMENT AND PLAN / ED COURSE  As part of my medical decision making, I reviewed the following data within the electronic MEDICAL RECORD NUMBER Notes from prior ED visits and [place] Controlled Substance Database   This is a 77 year old male who comes into the hospital today with some altered mental status.  The patient was at his nursing home and was having some difficulty walking and not acting himself.  The patient had recently had a hemorrhagic stroke at [facility].  The patient does have a low-grade  temperature and is very slow to respond.  He can say his name and his birthdate but does not answer much other questions.  Since he does self cath there is a concern for possible UTI.  There is also a concern for possible pneumonia.  We checked some blood work on the patient to include a CBC, CMP, urinalysis and a troponin.  The patient's white blood cell count is 21.5.  The patient's lactic acid is normal.  The patient's urinalysis does show many bacteria with 21-50 white blood cells.  It appears that the patient does have a UTI.  I was called by the radiologist with a concern for a subacute hemorrhage in the patient's occipital lobe but looking back at the notes from [facility] it appears that he did have a hemorrhagic stroke in the same area previously.  The patient will be admitted to the hospitalist service.  I did give the patient a dose of cefepime for antibiotics.  I did initially admit the patient to the hospitalist service.  She did look over the patient's notes and was concerned that the patient may be having altered mental status due to his hemorrhagic stroke.  She requested that I consult [facility].  I spoke to Dr. Wandra Mannan of [facility] neurology the service which admitted the patient previously.  He evaluated  the CT scan and stated that the hemorrhagic portion of the stroke appeared smaller and it looks like it was resorbing.  He states otherwise that the CT scan looks the same as it had previously.  He also agrees that the patient likely has an infection because he had his catheter placed while at [facility].  We will have the patient admitted to our hospital for antibiotics for altered mental status.      ____________________________________________   FINAL CLINICAL IMPRESSION(S) / ED DIAGNOSES  Final diagnoses:  Altered mental status, unspecified altered mental status type  Urinary tract infection associated with catheterization of urinary tract, unspecified indwelling urinary catheter type, initial encounter [facility])     ED Discharge Orders    None       Note:  This document was prepared using Dragon voice recognition software and may include unintentional dictation errors.    Loney Hering, MD 01/30/18 0113    Loney Hering, MD 01/30/18 [phone]

## 2018-01-30 NOTE — Consult Note (Signed)
Pharmacy Antibiotic Note  Adam Rios is a 78 y.o. male admitted on 01/29/2018 with  UTI.  Pharmacy has been consulted for Cefepime dosing. Patient received cefepime 2g IV x 1 dose in ED.   Plan: Start Cefepime 1g IV every 12 hours.   Height: 6' (182.9 cm) Weight: 196 lb 3.4 oz (89 kg) IBW/kg (Calculated) : 77.6  Temp (24hrs), Avg:100 F (37.8 C), Min:100 F (37.8 C), Max:100 F (37.8 C)  Recent Labs  Lab 01/29/18 2243 01/29/18 2248  WBC 21.5*  --   CREATININE 1.03  --   LATICACIDVEN  --  1.0    Estimated Creatinine Clearance: 65.9 mL/min (by C-G formula based on SCr of 1.03 mg/dL).    Allergies  Allergen Reactions  . Fentanyl Nausea Only    Antimicrobials this admission: 9/1 Cefepime >>   Microbiology results: 9/1 BCx: pending  9/1 UCx: pending  Thank you for allowing pharmacy to be a part of this patient's care.  Pernell Dupre, PharmD, BCPS Clinical Pharmacist 01/30/2018 2:51 AM

## 2018-01-30 NOTE — Progress Notes (Signed)
Adam Rios at [place] NAME: Adam Rios    MR#:  [id number]  DATE OF BIRTH:  September 28, 1940  SUBJECTIVE:  CHIEF COMPLAINT:   Chief Complaint  Patient presents with  . Altered Mental Status  Patient seen and evaluated today Awake and responds to verbal commands No fever No shortness of breath  REVIEW OF SYSTEMS:    ROS  CONSTITUTIONAL: No documented fever. Has fatigue, weakness. No weight gain, no weight loss.  EYES: No blurry or double vision.  ENT: No tinnitus. No postnasal drip. No redness of the oropharynx.  RESPIRATORY: No cough, no wheeze, no hemoptysis. No dyspnea.  CARDIOVASCULAR: No chest pain. No orthopnea. No palpitations. No syncope.  GASTROINTESTINAL: No nausea, no vomiting or diarrhea. No abdominal pain. No melena or hematochezia.  GENITOURINARY: No dysuria or hematuria.  ENDOCRINE: No polyuria or nocturia. No heat or cold intolerance.  HEMATOLOGY: No anemia. No bruising. No bleeding.  INTEGUMENTARY: No rashes. No lesions.  MUSCULOSKELETAL: No arthritis. No swelling. No gout.  NEUROLOGIC: No numbness, tingling, or ataxia. No seizure-type activity.  PSYCHIATRIC: No anxiety. No insomnia. No ADD.   DRUG ALLERGIES:   Allergies  Allergen Reactions  . Fentanyl Nausea Only    VITALS:  Blood pressure 127/72, pulse 83, temperature 98.9 F (37.2 C), resp. rate 16, height 6' (1.829 m), weight 90.9 kg, SpO2 93 %.  PHYSICAL EXAMINATION:   Physical Exam  GENERAL:  77 y.o.-year-old patient lying in the bed with no acute distress.  EYES: Pupils equal, round, reactive to light and accommodation. No scleral icterus. Extraocular muscles intact.  HEENT: Head atraumatic, normocephalic. Oropharynx and nasopharynx clear.  NECK:  Supple, no jugular venous distention. No thyroid enlargement, no tenderness.  LUNGS: Improved breath sounds bilaterally, scattered rales heard. No use of accessory muscles of respiration.  CARDIOVASCULAR: S1, S2  normal. No murmurs, rubs, or gallops.  ABDOMEN: Soft, nontender, nondistended. Bowel sounds present. No organomegaly or mass.  EXTREMITIES: No cyanosis, clubbing or edema b/l.    NEUROLOGIC: Awake, alert and responds to verbal commands Moves extremities PSYCHIATRIC: mood pleasant SKIN: No obvious rash, lesion, or ulcer.   LABORATORY PANEL:   CBC Recent Labs  Lab 01/30/18 0506  WBC 18.1*  HGB 15.1  HCT 43.8  PLT 251   ------------------------------------------------------------------------------------------------------------------ Chemistries  Recent Labs  Lab 01/29/18 2243 01/30/18 0506  NA 132* 134*  K 4.2 3.9  CL 102 102  CO2 21* 23  GLUCOSE 109* 104*  BUN 29* 29*  CREATININE 1.03 1.01  CALCIUM 8.9 8.7*  AST 18  --   ALT 22  --   ALKPHOS 55  --   BILITOT 1.1  --    ------------------------------------------------------------------------------------------------------------------  Cardiac Enzymes Recent Labs  Lab 01/29/18 2243  TROPONINI 0.03*   ------------------------------------------------------------------------------------------------------------------  RADIOLOGY:  Dg Chest 2 View  Result Date: 01/30/2018 CLINICAL DATA:  Altered mental status EXAM: CHEST - 2 VIEW COMPARISON:  Chest radiograph 12/17/2009 FINDINGS: Low lung volumes. Stable cardiac and mediastinal contours. Tortuous thoracic aorta. Bibasilar atelectasis. No large area pulmonary consolidation. No pleural effusion pneumothorax. Thoracic spine degenerative changes. IMPRESSION: No acute cardiopulmonary process. Electronically Signed   By: Lovey Newcomer M.D.   On: 01/30/2018 00:33   Ct Head Wo Contrast  Result Date: 01/29/2018 CLINICAL DATA:  Altered mental status for 3 days.  Recent stroke. EXAM: CT HEAD WITHOUT CONTRAST TECHNIQUE: Contiguous axial images were obtained from the base of the skull through the vertex without intravenous contrast. COMPARISON:  None. FINDINGS: BRAIN: 2.5 x 2.6 x 3.8 cm  (volume = 13 cc) RIGHT occipital lobe mildly dense hematoma with surrounding low-density vasogenic edema. Effaced RIGHT occipital horn without intraventricular extension. Regional mass effect without midline shift. Moderate to severe parenchymal brain volume loss with disproportion sulcal effacement at the convexities associated with lobulated ventricular margin and narrowed callosum angle. Confluent supratentorial white matter hypodensities. No acute large vascular territory infarct. No abnormal extra-axial fluid collections. Basal cisterns are patent. VASCULAR: Mild calcific atherosclerosis of the carotid siphons. Dolichoectatic intracranial vessels seen with chronic hypertension. SKULL: No skull fracture. No significant scalp soft tissue swelling. SINUSES/ORBITS: Trace paranasal sinus mucosal thickening. Mastoid air cells are well aerated.The included ocular globes and orbital contents are non-suspicious. OTHER: None. IMPRESSION: 1. 13 cc RIGHT occipital lobe subacute appearing hematoma. Regional mass effect without midline shift. 2. 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 3. Severe chronic small vessel ischemic changes. 4. Critical Value/emergent results were called by telephone at the time of interpretation on 01/29/2018 at 11:49 pm to Dr. Charlesetta Ivory , who verbally acknowledged these results. Electronically Signed   By: Elon Alas M.D.   On: 01/29/2018 23:52     ASSESSMENT AND PLAN:  77 year old male patient with history of recent hemorrhagic stroke, hypertension, neurogenic bladder, renal cell cancer, hypertension currently under hospitalist service for encephalopathy, sepsis and UTI  -Acute metabolic encephalopathy improving Improving with IV antibiotics Off sedation medication  -Acute urinary tract infection Replace Foley catheter Broad-spectrum IV antibiotics (cefepime) Follow-up cultures  -Leukocytosis  improving  -Subacute intracranial bleed (occipital lobe bleed) CT head reviewed Discussed with neurology No new imaging studies recommended Monitor patient clinically  -DNR by CODE STATUS  -Discussed with patient's daughter medical condition treatment plan  All the records are reviewed and case discussed with Care Management/Social Worker. Management plans discussed with the patient, family and they are in agreement.  CODE STATUS: DNR  DVT Prophylaxis: SCDs  TOTAL TIME TAKING CARE OF THIS PATIENT: 35 minutes.   POSSIBLE D/C IN 3 to 4 DAYS, DEPENDING ON CLINICAL CONDITION.  Saundra Shelling M.D on 01/30/2018 at 12:37 PM  Between 7am to 6pm - Pager - [phone]  After 6pm go to [email or web address] - password [email or web address] [facility]  Office  [phone]  CC: Primary care physician; Bary Richard, NP  Note: This dictation was prepared with Dragon dictation along with smaller phrase technology. Any transcriptional errors that result from this process are unintentional.

## 2018-01-30 NOTE — Consult Note (Signed)
Reason for Consult:AMS Referring Physician: Dr. Estanislado Pandy   CC: confusion sent from NH   HPI: Adam Rios is an 77 y.o. male  with a known history of cognitive impairment, hypertension, neurogenic bladder, prior hemorrhagic stroke.  Pt lives in nursing facility and was transferred from AMS/confusion/lethargy which has progressively been getting worse over the 3 days.  ON admission pt was found to have UTI with elevated WBC of 21.5.    CTH with subacute L occipital hemorrhage without significant shift or ventricular compression.     Past Medical History:  Diagnosis Date  . Cognitive impairment   . Hemorrhagic cerebrovascular accident (CVA) ([place])   . Hypertension   . Neurogenic bladder   . Renal cell carcinoma ([place])   . Renal disorder   . Stroke [facility])     Past Surgical History:  Procedure Laterality Date  . APPENDECTOMY    . HERNIA REPAIR    . LUMBAR FUSION    . NEPHRECTOMY      History reviewed. No pertinent family history.  Social History:  reports that he has quit smoking. His smoking use included cigarettes. He has a 2.50 pack-year smoking history. He has never used smokeless tobacco. He reports that he drank alcohol. He reports that he does not use drugs.  Allergies  Allergen Reactions  . Fentanyl Nausea Only    Medications: I have reviewed the patient's current medications.  ROS: Unable to obtain given the confusion   Physical Examination: Blood pressure 127/72, pulse 83, temperature 98.9 F (37.2 C), resp. rate 16, height 6' (1.829 m), weight 90.9 kg, SpO2 93 %.  More awake compared to admission  Pt is lethargic but following commands No clear focal deficits Motor strength with generalized weakness but again no focality Dysarthria present.  Gait not tested.    Laboratory Studies:   Basic Metabolic Panel: Recent Labs  Lab 01/29/18 2243 01/30/18 0506  NA 132* 134*  K 4.2 3.9  CL 102 102  CO2 21* 23  GLUCOSE 109* 104*  BUN 29* 29*  CREATININE 1.03  1.01  CALCIUM 8.9 8.7*    Liver Function Tests: Recent Labs  Lab 01/29/18 2243  AST 18  ALT 22  ALKPHOS 55  BILITOT 1.1  PROT 7.3  ALBUMIN 3.7   No results for input(s): LIPASE, AMYLASE in the last 168 hours. No results for input(s): AMMONIA in the last 168 hours.  CBC: Recent Labs  Lab 01/29/18 2243 01/30/18 0506  WBC 21.5* 18.1*  NEUTROABS 17.1*  --   HGB 15.2 15.1  HCT 45.3 43.8  MCV 86.8 86.7  PLT 286 251    Cardiac Enzymes: Recent Labs  Lab 01/29/18 2243  TROPONINI 0.03*    BNP: Invalid input(s): POCBNP  CBG: Recent Labs  Lab 01/30/18 1610  RUEAVW 09    Microbiology: Results for orders placed or performed during the hospital encounter of 01/29/18  Culture, blood (Routine x 2)     Status: None (Preliminary result)   Collection Time: 01/29/18 10:56 PM  Result Value Ref Range Status   Specimen Description BLOOD RIGHT FOREARM  Final   Special Requests   Final    BOTTLES DRAWN AEROBIC AND ANAEROBIC Blood Culture results may not be optimal due to an excessive volume of blood received in culture bottles   Culture   Final    NO GROWTH < 12 HOURS Performed at [facility], [address] [place], [place] [address]    Report Status PENDING  Incomplete  Culture, blood (Routine x 2)     Status: None (Preliminary result)   Collection Time: 01/29/18 10:56 PM  Result Value Ref Range Status   Specimen Description BLOOD LEFT FOREARM  Final   Special Requests   Final    BOTTLES DRAWN AEROBIC AND ANAEROBIC Blood Culture results may not be optimal due to an excessive volume of blood received in culture bottles   Culture   Final    NO GROWTH < 12 HOURS Performed at [facility], [address], [place], [place] [address]    Report Status PENDING  Incomplete  MRSA PCR Screening     Status: Abnormal   Collection Time: 01/30/18  4:21 AM  Result Value Ref Range Status   MRSA by PCR POSITIVE (A) NEGATIVE Final    Comment:        The  GeneXpert MRSA Assay (FDA approved for NASAL specimens only), is one component of a comprehensive MRSA colonization surveillance program. It is not intended to diagnose MRSA infection nor to guide or monitor treatment for MRSA infections. RESULT CALLED TO, READ BACK BY AND VERIFIED WITH: CARA CAMPBELL AT 0626 ON 01/30/18 BY SNJ Performed at [facility], [address] [place], [place] [address]     Coagulation Studies: Recent Labs    01/29/18 2243  LABPROT 13.9  INR 1.08    Urinalysis:  Recent Labs  Lab 01/29/18 2243  COLORURINE YELLOW*  LABSPEC 1.023  PHURINE 5.0  GLUCOSEU NEGATIVE  HGBUR LARGE*  BILIRUBINUR NEGATIVE  KETONESUR NEGATIVE  PROTEINUR 30*  NITRITE NEGATIVE  LEUKOCYTESUR SMALL*    Lipid Panel:  No results found for: CHOL, TRIG, HDL, CHOLHDL, VLDL, LDLCALC  HgbA1C: No results found for: HGBA1C  Urine Drug Screen:  No results found for: LABOPIA, COCAINSCRNUR, LABBENZ, AMPHETMU, THCU, LABBARB  Alcohol Level: No results for input(s): ETH in the last 168 hours.  Imaging: Dg Chest 2 View  Result Date: 01/30/2018 CLINICAL DATA:  Altered mental status EXAM: CHEST - 2 VIEW COMPARISON:  Chest radiograph 12/17/2009 FINDINGS: Low lung volumes. Stable cardiac and mediastinal contours. Tortuous thoracic aorta. Bibasilar atelectasis. No large area pulmonary consolidation. No pleural effusion pneumothorax. Thoracic spine degenerative changes. IMPRESSION: No acute cardiopulmonary process. Electronically Signed   By: Lovey Newcomer M.D.   On: 01/30/2018 00:33   Ct Head Wo Contrast  Result Date: 01/29/2018 CLINICAL DATA:  Altered mental status for 3 days.  Recent stroke. EXAM: CT HEAD WITHOUT CONTRAST TECHNIQUE: Contiguous axial images were obtained from the base of the skull through the vertex without intravenous contrast. COMPARISON:  None. FINDINGS: BRAIN: 2.5 x 2.6 x 3.8 cm (volume = 13 cc) RIGHT occipital lobe mildly dense hematoma with surrounding  low-density vasogenic edema. Effaced RIGHT occipital horn without intraventricular extension. Regional mass effect without midline shift. Moderate to severe parenchymal brain volume loss with disproportion sulcal effacement at the convexities associated with lobulated ventricular margin and narrowed callosum angle. Confluent supratentorial white matter hypodensities. No acute large vascular territory infarct. No abnormal extra-axial fluid collections. Basal cisterns are patent. VASCULAR: Mild calcific atherosclerosis of the carotid siphons. Dolichoectatic intracranial vessels seen with chronic hypertension. SKULL: No skull fracture. No significant scalp soft tissue swelling. SINUSES/ORBITS: Trace paranasal sinus mucosal thickening. Mastoid air cells are well aerated.The included ocular globes and orbital contents are non-suspicious. OTHER: None. IMPRESSION: 1. 13 cc RIGHT occipital lobe subacute appearing hematoma. Regional mass effect without midline shift. 2. Moderate to severe parenchymal brain volume loss with suspected component of normal  pressure hydrocephalus. 3. Severe chronic small vessel ischemic changes. 4. Critical Value/emergent results were called by telephone at the time of interpretation on 01/29/2018 at 11:49 pm to Dr. Charlesetta Ivory , who verbally acknowledged these results. Electronically Signed   By: Elon Alas M.D.   On: 01/29/2018 23:52     Assessment/Plan:  77 y.o. male  with a known history of cognitive impairment, hypertension, neurogenic bladder, prior hemorrhagic stroke.  Pt lives in nursing facility and was transferred from AMS/confusion/lethargy which has progressively been getting worse over the 3 days.  ON admission pt was found to have UTI with elevated WBC of 21.5.    CTH with subacute occipital hemorrhage without significant shift or ventricular compression.    - Pt's mental status is improving and he is being treated for UTI - CTH as above with subacute R occipital  bleed which has not clear mass effect on R lateral horn of the ventricle or brainstem - He has cerebral atrophy/volume loss which given the potential picture for Normal pressure hydrocephalus but I think this is just volume loss - Would hold of MRI at this time and only repeat imaging if there is a change in his neurological exam - d/w with Dr. Estanislado Pandy and nursing staff. Appreciate the assistance.  01/30/2018, 10:27 AM

## 2018-01-30 NOTE — Progress Notes (Signed)
Patient arrived to 2A Room 236. Skin assessment completed with Candace RN and skin intact with scattered healed abrasions noted. A&O to self only and VSS. Modified NIHSS completed per orders. Nursing staff will continue to monitor for any changes in patient status. Earleen Reaper, RN

## 2018-01-30 NOTE — Plan of Care (Signed)
  Problem: Pain Managment: Goal: General experience of comfort will improve Outcome: Progressing   Problem: Safety: Goal: Ability to remain free from injury will improve Outcome: Progressing   Problem: Urinary Elimination: Goal: Signs and symptoms of infection will decrease Outcome: Progressing

## 2018-01-30 NOTE — H&P (Signed)
[facility] [place] NAME: Adam Rios    MR#:  [id number]  DATE OF BIRTH:  11/22/1940  DATE OF ADMISSION:  01/29/2018  PRIMARY CARE PHYSICIAN: Bary Richard, NP   REQUESTING/REFERRING PHYSICIAN:   CHIEF COMPLAINT:   Chief Complaint  Patient presents with  . Altered Mental Status    HISTORY OF PRESENT ILLNESS: Adam Rios  is a 77 y.o. male with a known history of cognitive impairment, hypertension, neurogenic bladder, recent hemorrhagic stroke. Patient is currently obtunded, unable to provide history.  Also information was taken from reviewing the medical chart and from discussion with emergency room physician. He was transferred from nursing home to emergency room for altered mental status noticed by the nursing home staff in the past 2 to 3 days, gradually getting worse.  Apparently he used to self catheterize himself and he was also able to ambulate up until 2 days ago.  No reports of fever, nausea, vomiting, diarrhea, bleeding. Blood test done emergency room are notable for elevated WBC at 21.5.  Sodium level is 132.  Troponin level is 0.03.  Lactic acid level is 1.  Glucose level is 109.  UTI is positive for UA. Chest x-ray is negative for acute cardiopulmonary process. Brain CT shows 3 cc RIGHT occipital lobe subacute appearing hematoma. Regional mass effect without midline shift.  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  Severe chronic small vessel ischemic changes are noted.  Patient is admitted for further evaluation and treatment.   PAST MEDICAL HISTORY:   Past Medical History:  Diagnosis Date  . Cognitive impairment   . Hemorrhagic cerebrovascular accident (CVA) ([place])   . Hypertension   . Neurogenic bladder   . Renal cell carcinoma ([place])   . Renal disorder   . Stroke [facility])     PAST SURGICAL HISTORY:  Past Surgical History:  Procedure  Laterality Date  . APPENDECTOMY    . HERNIA REPAIR    . LUMBAR FUSION    . NEPHRECTOMY      SOCIAL HISTORY:  Social History   Tobacco Use  . Smoking status: Former Smoker    Packs/day: 0.25    Years: 10.00    Pack years: 2.50    Types: Cigarettes  . Smokeless tobacco: Never Used  Substance Use Topics  . Alcohol use: Not Currently    FAMILY HISTORY: History reviewed. No pertinent family history.  DRUG ALLERGIES:  Allergies  Allergen Reactions  . Fentanyl Nausea Only    REVIEW OF SYSTEMS:   Unable to obtain due to patient being obtunded.  MEDICATIONS AT HOME:  Prior to Admission medications   Medication Sig Start Date End Date Taking? Authorizing Provider  amLODipine (NORVASC) 10 MG tablet Take 10 mg by mouth daily.  02/18/17  Yes [provider]  baclofen (LIORESAL) 10 MG tablet Take 5 mg by mouth 3 (three) times daily.   Yes [provider]  carboxymethylcellulose (REFRESH PLUS) 0.5 % SOLN Apply 1 drop to eye 4 (four) times daily as needed.   Yes [provider]  docusate sodium (COLACE) 100 MG capsule Take 100 mg by mouth 2 (two) times daily.   Yes [provider]  losartan (COZAAR) 25 MG tablet Take 50 mg by mouth daily.  03/27/17 01/29/18 Yes [provider]  oxybutynin (DITROPAN-XL) 5 MG 24 hr tablet Take 5 mg by mouth daily.   Yes [provider]  senna (SENOKOT) 8.6 MG tablet Take by mouth.   Yes [provider]  tizanidine (ZANAFLEX) 2 MG capsule Take 2 mg by mouth every 6 (six) hours as needed for muscle spasms.   Yes [provider]  acetaminophen (TYLENOL) 325 MG tablet Take 650 mg by mouth. 08/14/14   [provider]  aspirin EC 81 MG tablet Take 81 mg by mouth.    [provider]  guaiFENesin (ROBITUSSIN) 100 MG/5ML SOLN Take 5 mLs every 4 (four) hours as needed by mouth for cough or to loosen phlegm.    [provider]  pantoprazole (PROTONIX) 40 MG tablet Take  40 mg daily by mouth.    [provider]  POLYETHYLENE GLYCOL 3350 PO Take by mouth.    [provider]      PHYSICAL EXAMINATION:   VITAL SIGNS: Blood pressure (!) 110/95, pulse 85, temperature 100 F (37.8 C), temperature source Rectal, resp. rate (!) 0, height 6' (1.829 m), weight 89 kg, SpO2 97 %.  GENERAL:  77 y.o.-year-old patient lying in the bed with no acute distress.  EYES: Pupils equal, round, reactive to light and accommodation. No scleral icterus. Extraocular muscles intact.  HEENT: Head atraumatic, normocephalic. Oropharynx and nasopharynx clear.  NECK:  Supple, no jugular venous distention. No thyroid enlargement, no tenderness.  LUNGS: Normal breath sounds bilaterally, no wheezing, rales,rhonchi or crepitation. No use of accessory muscles of respiration.  CARDIOVASCULAR: S1, S2 normal. No S3/S4.  ABDOMEN: Soft, nontender, nondistended. Bowel sounds present. No organomegaly or mass.  EXTREMITIES: No pedal edema, cyanosis, or clubbing.  NEUROLOGIC: Cranial nerves II through XII are intact. Muscle strength 5/5 in all extremities. Sensation intact.   PSYCHIATRIC: The patient is alert and oriented x 3.  SKIN: No obvious rash, lesion, or ulcer.   LABORATORY PANEL:   CBC Recent Labs  Lab 01/29/18 2243  WBC 21.5*  HGB 15.2  HCT 45.3  PLT 286  MCV 86.8  MCH 29.2  MCHC 33.6  RDW 14.0  LYMPHSABS 2.4  MONOABS 1.8*  EOSABS 0.1  BASOSABS 0.1   ------------------------------------------------------------------------------------------------------------------  Chemistries  Recent Labs  Lab 01/29/18 2243  NA 132*  K 4.2  CL 102  CO2 21*  GLUCOSE 109*  BUN 29*  CREATININE 1.03  CALCIUM 8.9  AST 18  ALT 22  ALKPHOS 55  BILITOT 1.1   ------------------------------------------------------------------------------------------------------------------ estimated creatinine clearance is 65.9 mL/min (by C-G formula based on SCr of 1.03  mg/dL). ------------------------------------------------------------------------------------------------------------------ No results for input(s): TSH, T4TOTAL, T3FREE, THYROIDAB in the last 72 hours.  Invalid input(s): FREET3   Coagulation profile Recent Labs  Lab 01/29/18 2243  INR 1.08   ------------------------------------------------------------------------------------------------------------------- No results for input(s): DDIMER in the last 72 hours. -------------------------------------------------------------------------------------------------------------------  Cardiac Enzymes Recent Labs  Lab 01/29/18 2243  TROPONINI 0.03*   ------------------------------------------------------------------------------------------------------------------ Invalid input(s): POCBNP  ---------------------------------------------------------------------------------------------------------------  Urinalysis    Component Value Date/Time   COLORURINE YELLOW (A) 01/29/2018 2243   APPEARANCEUR CLOUDY (A) 01/29/2018 2243   LABSPEC 1.023 01/29/2018 2243   PHURINE 5.0 01/29/2018 2243   GLUCOSEU NEGATIVE 01/29/2018 2243   HGBUR LARGE (A) 01/29/2018 2243   BILIRUBINUR NEGATIVE 01/29/2018 2243   KETONESUR NEGATIVE 01/29/2018 2243   PROTEINUR 30 (A) 01/29/2018 2243   NITRITE NEGATIVE 01/29/2018 2243   LEUKOCYTESUR SMALL (A) 01/29/2018 2243     RADIOLOGY: Dg Chest 2 View  Result Date: 01/30/2018 CLINICAL DATA:  Altered mental status EXAM: CHEST - 2 VIEW COMPARISON:  Chest radiograph 12/17/2009 FINDINGS: Low lung  volumes. Stable cardiac and mediastinal contours. Tortuous thoracic aorta. Bibasilar atelectasis. No large area pulmonary consolidation. No pleural effusion pneumothorax. Thoracic spine degenerative changes. IMPRESSION: No acute cardiopulmonary process. Electronically Signed   By: Lovey Newcomer M.D.   On: 01/30/2018 00:33   Ct Head Wo Contrast  Result Date: 01/29/2018 CLINICAL  DATA:  Altered mental status for 3 days.  Recent stroke. EXAM: CT HEAD WITHOUT CONTRAST TECHNIQUE: Contiguous axial images were obtained from the base of the skull through the vertex without intravenous contrast. COMPARISON:  None. FINDINGS: BRAIN: 2.5 x 2.6 x 3.8 cm (volume = 13 cc) RIGHT occipital lobe mildly dense hematoma with surrounding low-density vasogenic edema. Effaced RIGHT occipital horn without intraventricular extension. Regional mass effect without midline shift. Moderate to severe parenchymal brain volume loss with disproportion sulcal effacement at the convexities associated with lobulated ventricular margin and narrowed callosum angle. Confluent supratentorial white matter hypodensities. No acute large vascular territory infarct. No abnormal extra-axial fluid collections. Basal cisterns are patent. VASCULAR: Mild calcific atherosclerosis of the carotid siphons. Dolichoectatic intracranial vessels seen with chronic hypertension. SKULL: No skull fracture. No significant scalp soft tissue swelling. SINUSES/ORBITS: Trace paranasal sinus mucosal thickening. Mastoid air cells are well aerated.The included ocular globes and orbital contents are non-suspicious. OTHER: None. IMPRESSION: 1. 13 cc RIGHT occipital lobe subacute appearing hematoma. Regional mass effect without midline shift. 2. 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 3. Severe chronic small vessel ischemic changes. 4. Critical Value/emergent results were called by telephone at the time of interpretation on 01/29/2018 at 11:49 pm to Dr. Charlesetta Ivory , who verbally acknowledged these results. Electronically Signed   By: Elon Alas M.D.   On: 01/29/2018 23:52    EKG: Orders placed or performed during the hospital encounter of 01/29/18  . EKG 12-Lead  . EKG 12-Lead  . ED EKG  . ED EKG    IMPRESSION AND PLAN:  1.  Acute encephalopathy, likely multifactorial,  secondary to acute infectious process and possibly side effects from medications.  Will start treatment with IV fluids and broad-spectrum IV antibiotics.  Will hold medications with sedative effect like Ditropan, baclofen and Zanaflex. 2.  Acute UTI.  Will place new Foley catheter.  We will start broad-spectrum IV antibiotics, while pending urine culture results. 3.  Leukocytosis, likely related to UTI, but other infections are not excluded.  We will check blood cultures, urine culture and stool studies if patient would have diarrhea during the hospital stay.  To to monitor clinically closely. 4.  Subacute intracranial bleed, secondary to recent hemorrhagic stroke.  Patient was admitted to you on [facility] for this.  Per brain imaging, the area of hemorrhage seems to be stable, even smaller, when compared with prior CT scan from [facility].  Neurology from [facility] was called by Dr. Dahlia Client, ER physician for review of the brain imaging.  Per Dr. Wandra Mannan, [facility] neurology, it is unlikely that patient's altered mental status is related to intracranial process at this time.  On further review of the documents from nursing home, it is noted that patient has papers for DNR/DNI and do not hospitalize.  All the records are reviewed and case discussed with ED provider. Management plans discussed with the patient, family and they are in agreement.  CODE STATUS: DNR    TOTAL TIME TAKING CARE OF THIS PATIENT: 45 minutes.    Amelia Jo M.D on 01/30/2018 at 2:08 AM  Between 7am to 6pm - Pager -  [phone]  After 6pm go to [email or web address] - password [email or web address] [facility] [place] [facility]  [phone]  CC: Primary care physician; Bary Richard, NP

## 2018-01-30 NOTE — Consult Note (Addendum)
Consult: urinary retention, difficult foley  History of Present Illness: 77 yo WM with h/o neurogenic bladder and CIC admitted with AMS changes. He has not been able to do CIC. Nurses placed a foley but it was not draining and blood noted around it. Urology consulted.   Pt saw Dr. Bernardo Heater last fall and cystoscopy revealed BPH. He has a h/o NGB after spinal surgery several years ago and has been on intermittent catheterization since then. He is status post a nephrectomy many years ago for renal cell carcinoma.  A CT of the abdomen and pelvis performed Aug 2019 showed a surgically absent left kidney and no calculi or hydronephrosis (see Care Everywhere).   Past Medical History:  Diagnosis Date  . Cognitive impairment   . Hemorrhagic cerebrovascular accident (CVA) ([place])   . Hypertension   . Neurogenic bladder   . Renal cell carcinoma ([place])   . Renal disorder   . Stroke [facility])    Past Surgical History:  Procedure Laterality Date  . APPENDECTOMY    . HERNIA REPAIR    . LUMBAR FUSION    . NEPHRECTOMY      Home Medications:  Medications Prior to Admission  Medication Sig Dispense Refill Last Dose  . amLODipine (NORVASC) 10 MG tablet Take 10 mg by mouth daily.    01/29/2018 at 0900  . baclofen (LIORESAL) 10 MG tablet Take 5 mg by mouth 3 (three) times daily.   01/29/2018 at 1900  . carboxymethylcellulose (REFRESH PLUS) 0.5 % SOLN Apply 1 drop to eye 4 (four) times daily as needed.   prn at prn  . docusate sodium (COLACE) 100 MG capsule Take 100 mg by mouth 2 (two) times daily.   01/29/2018 at 1700  . losartan (COZAAR) 25 MG tablet Take 50 mg by mouth daily.    01/29/2018 at 0900  . oxybutynin (DITROPAN-XL) 5 MG 24 hr tablet Take 5 mg by mouth daily.   01/29/2018 at 0900  . senna (SENOKOT) 8.6 MG tablet Take by mouth.   prn at prn  . tizanidine (ZANAFLEX) 2 MG capsule Take 2 mg by mouth every 6 (six) hours as needed for muscle spasms.   prn at prn  . acetaminophen (TYLENOL) 325 MG tablet Take 650  mg by mouth.   Not Taking at Unknown time  . aspirin EC 81 MG tablet Take 81 mg by mouth.   Not Taking at Unknown time  . guaiFENesin (ROBITUSSIN) 100 MG/5ML SOLN Take 5 mLs every 4 (four) hours as needed by mouth for cough or to loosen phlegm.   Not Taking at Unknown time  . pantoprazole (PROTONIX) 40 MG tablet Take 40 mg daily by mouth.   Not Taking at Unknown time  . POLYETHYLENE GLYCOL 3350 PO Take by mouth.   Not Taking at Unknown time   Allergies:  Allergies  Allergen Reactions  . Fentanyl Nausea Only    History reviewed. No pertinent family history. Social History:  reports that he has quit smoking. His smoking use included cigarettes. He has a 2.50 pack-year smoking history. He has never used smokeless tobacco. He reports that he drank alcohol. He reports that he does not use drugs.  ROS: A complete review of systems was performed.  All systems are negative except for pertinent findings as noted. Review of Systems  All other systems reviewed and are negative.    Physical Exam:  Vital signs in last 24 hours: Temp:  [97.5 F (36.4 C)-100 F (37.8 C)] 98.3 F (  36.8 C) (09/01 1930) Pulse Rate:  [76-94] 89 (09/01 1930) Resp:  [0-22] 18 (09/01 1930) BP: (97-130)/(66-95) 119/76 (09/01 1930) SpO2:  [92 %-97 %] 95 % (09/01 1930) Weight:  [89 kg-90.9 kg] 90.9 kg (09/01 0415) General:  Alert and in no acute distress, but his statements are odd and out of context HEENT: normocephalic Cardiovascular: Regular rate and rhythm Lungs: Regular rate and effort Abdomen: Soft, nontender, nondistended, no abdominal masses, bladder mildly distended Back: No CVA tenderness Extremities: No edema Neurologic: Grossly intact GU: normal meatus and penis - small amount of dried blood. Scrotum normal and testicles palpably normal (pt said the testicles were "sensitive")  Procedure: Nurse was chaperone. He was prepped and an 18Fr coude was placed without difficulty. Drained about 500 ml dark  urine. No red color or clots.   Laboratory Data:  Results for orders placed or performed during the hospital encounter of 01/29/18 (from the past 24 hour(s))  Protime-INR     Status: None   Collection Time: 01/29/18 10:43 PM  Result Value Ref Range   Prothrombin Time 13.9 11.4 - 15.2 seconds   INR 1.08   APTT     Status: Abnormal   Collection Time: 01/29/18 10:43 PM  Result Value Ref Range   aPTT 41 (H) 24 - 36 seconds  CBC     Status: Abnormal   Collection Time: 01/29/18 10:43 PM  Result Value Ref Range   WBC 21.5 (H) 3.8 - 10.6 K/uL   RBC 5.21 4.40 - 5.90 MIL/uL   Hemoglobin 15.2 13.0 - 18.0 g/dL   HCT 45.3 40.0 - 52.0 %   MCV 86.8 80.0 - 100.0 fL   MCH 29.2 26.0 - 34.0 pg   MCHC 33.6 32.0 - 36.0 g/dL   RDW 14.0 11.5 - 14.5 %   Platelets 286 150 - 440 K/uL  Differential     Status: Abnormal   Collection Time: 01/29/18 10:43 PM  Result Value Ref Range   Neutrophils Relative % 80 %   Neutro Abs 17.1 (H) 1.4 - 6.5 K/uL   Lymphocytes Relative 11 %   Lymphs Abs 2.4 1.0 - 3.6 K/uL   Monocytes Relative 8 %   Monocytes Absolute 1.8 (H) 0.2 - 1.0 K/uL   Eosinophils Relative 1 %   Eosinophils Absolute 0.1 0 - 0.7 K/uL   Basophils Relative 0 %   Basophils Absolute 0.1 0 - 0.1 K/uL  Comprehensive metabolic panel     Status: Abnormal   Collection Time: 01/29/18 10:43 PM  Result Value Ref Range   Sodium 132 (L) 135 - 145 mmol/L   Potassium 4.2 3.5 - 5.1 mmol/L   Chloride 102 98 - 111 mmol/L   CO2 21 (L) 22 - 32 mmol/L   Glucose, Bld 109 (H) 70 - 99 mg/dL   BUN 29 (H) 8 - 23 mg/dL   Creatinine, Ser 1.03 0.61 - 1.24 mg/dL   Calcium 8.9 8.9 - 10.3 mg/dL   Total Protein 7.3 6.5 - 8.1 g/dL   Albumin 3.7 3.5 - 5.0 g/dL   AST 18 15 - 41 U/L   ALT 22 0 - 44 U/L   Alkaline Phosphatase 55 38 - 126 U/L   Total Bilirubin 1.1 0.3 - 1.2 mg/dL   GFR calc non Af Amer >60 >60 mL/min   GFR calc Af Amer >60 >60 mL/min   Anion gap 9 5 - 15  Troponin I     Status: Abnormal   Collection  Time:  01/29/18 10:43 PM  Result Value Ref Range   Troponin I 0.03 (HH) <0.03 ng/mL  Urinalysis, Complete w Microscopic     Status: Abnormal   Collection Time: 01/29/18 10:43 PM  Result Value Ref Range   Color, Urine YELLOW (A) YELLOW   APPearance CLOUDY (A) CLEAR   Specific Gravity, Urine 1.023 1.005 - 1.030   pH 5.0 5.0 - 8.0   Glucose, UA NEGATIVE NEGATIVE mg/dL   Hgb urine dipstick LARGE (A) NEGATIVE   Bilirubin Urine NEGATIVE NEGATIVE   Ketones, ur NEGATIVE NEGATIVE mg/dL   Protein, ur 30 (A) NEGATIVE mg/dL   Nitrite NEGATIVE NEGATIVE   Leukocytes, UA SMALL (A) NEGATIVE   RBC / HPF >50 (H) 0 - 5 RBC/hpf   WBC, UA 21-50 0 - 5 WBC/hpf   Bacteria, UA MANY (A) NONE SEEN   Squamous Epithelial / LPF 0-5 0 - 5   Mucus PRESENT   Lactic acid, plasma     Status: None   Collection Time: 01/29/18 10:48 PM  Result Value Ref Range   Lactic Acid, Venous 1.0 0.5 - 1.9 mmol/L  Culture, blood (Routine x 2)     Status: None (Preliminary result)   Collection Time: 01/29/18 10:56 PM  Result Value Ref Range   Specimen Description BLOOD RIGHT FOREARM    Special Requests      BOTTLES DRAWN AEROBIC AND ANAEROBIC Blood Culture results may not be optimal due to an excessive volume of blood received in culture bottles   Culture      NO GROWTH < 12 HOURS Performed at [facility], [address] [place], [place] [address]    Report Status PENDING   Culture, blood (Routine x 2)     Status: None (Preliminary result)   Collection Time: 01/29/18 10:56 PM  Result Value Ref Range   Specimen Description BLOOD LEFT FOREARM    Special Requests      BOTTLES DRAWN AEROBIC AND ANAEROBIC Blood Culture results may not be optimal due to an excessive volume of blood received in culture bottles   Culture      NO GROWTH < 12 HOURS Performed at [facility], [address] [place], [place] [address]    Report Status PENDING   MRSA PCR Screening     Status: Abnormal   Collection Time:  01/30/18  4:21 AM  Result Value Ref Range   MRSA by PCR POSITIVE (A) NEGATIVE  Basic metabolic panel     Status: Abnormal   Collection Time: 01/30/18  5:06 AM  Result Value Ref Range   Sodium 134 (L) 135 - 145 mmol/L   Potassium 3.9 3.5 - 5.1 mmol/L   Chloride 102 98 - 111 mmol/L   CO2 23 22 - 32 mmol/L   Glucose, Bld 104 (H) 70 - 99 mg/dL   BUN 29 (H) 8 - 23 mg/dL   Creatinine, Ser 1.01 0.61 - 1.24 mg/dL   Calcium 8.7 (L) 8.9 - 10.3 mg/dL   GFR calc non Af Amer >60 >60 mL/min   GFR calc Af Amer >60 >60 mL/min   Anion gap 9 5 - 15  CBC     Status: Abnormal   Collection Time: 01/30/18  5:06 AM  Result Value Ref Range   WBC 18.1 (H) 3.8 - 10.6 K/uL   RBC 5.06 4.40 - 5.90 MIL/uL   Hemoglobin 15.1 13.0 - 18.0 g/dL   HCT 43.8 40.0 - 52.0 %   MCV 86.7 80.0 - 100.0  fL   MCH 29.8 26.0 - 34.0 pg   MCHC 34.3 32.0 - 36.0 g/dL   RDW 13.9 11.5 - 14.5 %   Platelets 251 150 - 440 K/uL  Glucose, capillary     Status: None   Collection Time: 01/30/18  7:39 AM  Result Value Ref Range   Glucose-Capillary 94 70 - 99 mg/dL   Recent Results (from the past 240 hour(s))  Culture, blood (Routine x 2)     Status: None (Preliminary result)   Collection Time: 01/29/18 10:56 PM  Result Value Ref Range Status   Specimen Description BLOOD RIGHT FOREARM  Final   Special Requests   Final    BOTTLES DRAWN AEROBIC AND ANAEROBIC Blood Culture results may not be optimal due to an excessive volume of blood received in culture bottles   Culture   Final    NO GROWTH < 12 HOURS Performed at [facility], [address] [place], [place] [address]    Report Status PENDING  Incomplete  Culture, blood (Routine x 2)     Status: None (Preliminary result)   Collection Time: 01/29/18 10:56 PM  Result Value Ref Range Status   Specimen Description BLOOD LEFT FOREARM  Final   Special Requests   Final    BOTTLES DRAWN AEROBIC AND ANAEROBIC Blood Culture results may not be optimal due to an excessive volume  of blood received in culture bottles   Culture   Final    NO GROWTH < 12 HOURS Performed at [facility], [address] [place], [place] [address]    Report Status PENDING  Incomplete  MRSA PCR Screening     Status: Abnormal   Collection Time: 01/30/18  4:21 AM  Result Value Ref Range Status   MRSA by PCR POSITIVE (A) NEGATIVE Final    Comment:        The GeneXpert MRSA Assay (FDA approved for NASAL specimens only), is one component of a comprehensive MRSA colonization surveillance program. It is not intended to diagnose MRSA infection nor to guide or monitor treatment for MRSA infections. RESULT CALLED TO, READ BACK BY AND VERIFIED WITH: CARA CAMPBELL AT 0626 ON 01/30/18 BY [facility] Performed at [facility], [address] [place], [place] [address]    Creatinine: Recent Labs    01/29/18 2243 01/30/18 0506  CREATININE 1.03 1.01    Impression/Assessment/plan:  Hematuria - likely from foley placement. Cysto last year showed BPH and CT Aug 2019 showed normal right kidney and no sign of recurrent kidney cancer.   NGB, retention - foley placed. Resume CIC once patient oriented and able to do CIC.   Will sign off, but please page GU with any questions or concerns.   Festus Aloe 01/30/2018, 10:41 PM

## 2018-01-31 LAB — BASIC METABOLIC PANEL
Anion gap: 8 (ref 5–15)
BUN: 28 mg/dL — AB (ref 8–23)
CO2: 21 mmol/L — ABNORMAL LOW (ref 22–32)
Calcium: 8.5 mg/dL — ABNORMAL LOW (ref 8.9–10.3)
Chloride: 107 mmol/L (ref 98–111)
Creatinine, Ser: 0.95 mg/dL (ref 0.61–1.24)
GFR calc Af Amer: >60 mL/min (ref >60)
GLUCOSE: 92 mg/dL (ref 70–99)
POTASSIUM: 4 mmol/L (ref 3.5–5.1)
Sodium: 136 mmol/L (ref 135–145)

## 2018-01-31 LAB — GLUCOSE, CAPILLARY: Glucose-Capillary: 76 mg/dL (ref 70–99)

## 2018-01-31 LAB — CBC
HCT: 40.9 % (ref 40.0–52.0)
Hemoglobin: 13.8 g/dL (ref 13.0–18.0)
MCH: 29.2 pg (ref 26.0–34.0)
MCHC: 33.7 g/dL (ref 32.0–36.0)
MCV: 86.5 fL (ref 80.0–100.0)
PLATELETS: 250 10*3/uL (ref 150–440)
RBC: 4.72 MIL/uL (ref 4.40–5.90)
RDW: 13.6 % (ref 11.5–14.5)
WBC: 13 10*3/uL — ABNORMAL HIGH (ref 3.8–10.6)

## 2018-01-31 NOTE — Clinical Social Work Note (Signed)
Clinical Social Work Assessment  Patient Details  Name: Adam Rios MRN: [id number] Date of Birth: February 12, 1941  Date of referral:  01/31/18               Reason for consult:  Other (Comment Required)(From [facility] )                Permission sought to share information with:  Chartered certified accountant granted to share information::  Yes, Verbal Permission Granted  Name::      [facility]   Agency::     Relationship::     Contact Information:     Housing/Transportation Living arrangements for the past 2 months:  [place], [place] of Information:  Facility, Adult Children, Patient Patient Interpreter Needed:  None Criminal Activity/Legal Involvement Pertinent to Current Situation/Hospitalization:  No - Comment as needed Significant Relationships:  Adult Children Lives with:  Self Do you feel safe going back to the place where you live?  Yes Need for family participation in patient care:  Yes (Comment)  Care giving concerns:  Patient came into [facility] from [facility] where he was for 2 days for short term rehab.    Social Worker assessment / plan:  Holiday representative (CSW) received consult that patient is from [facility]. Per Otila Kluver [facility] liaison patient is a short term rehab resident and can return to [facility] if [facility] authorization is received. Per Otila Kluver she will start [facility] authorization tomorrow because they are closed today due to the Labor Day holiday. PT is pending. CSW met with patient alone at bedside and made him aware of above. Patient was alert and oriented X2. Patient reported that he is from [facility] and is okay with going back. CSW contacted patient's daughter [name]. Per daughter patient was at [facility] and discharged to [facility]. Per daughter patient was only at [facility] for 2 days before coming to [facility]. Daughter is agreeable for patient to return to [facility]. CSW made daughter aware that a new [employer] SNF authorization will have to be received. Daughter  verbalized her understanding. FL2 complete. CSW will continue to follow and assist as needed.   Employment status:  Disabled (Comment on whether or not currently receiving Disability), Retired Nurse, adult PT Recommendations:  Not assessed at this time Information / Referral to community resources:  [place]  Patient/Family's Response to care:  Patient and his daughter are agreeable for patient to return to [facility].   Patient/Family's Understanding of and Emotional Response to Diagnosis, Current Treatment, and Prognosis:  Patient and his daughter were very pleasant and thanked CSW for assistance.   Emotional Assessment Appearance:  Appears stated age Attitude/Demeanor/Rapport:    Affect (typically observed):  Pleasant Orientation:  Oriented to Self, Oriented to Place, Fluctuating Orientation (Suspected and/or reported Sundowners) Alcohol / Substance use:  Not Applicable Psych involvement (Current and /or in the community):  No (Comment)  Discharge Needs  Concerns to be addressed:  Discharge Planning Concerns Readmission within the last 30 days:  No Current discharge risk:  Dependent with Mobility, Cognitively Impaired, Chronically ill Barriers to Discharge:  Continued Medical Work up   [employer], Veronia Beets, LCSW 01/31/2018, 2:14 PM

## 2018-01-31 NOTE — Progress Notes (Addendum)
[place] [facility] [place] NAME: Adam Rios    MR#:  [id number]  DATE OF BIRTH:  10-02-1940  SUBJECTIVE:  CHIEF COMPLAINT:   Chief Complaint  Patient presents with  . Altered Mental Status  Patient seen and evaluated today Awake and responds to verbal commands Completely oriented No fever No shortness of breath No abdominal pain  REVIEW OF SYSTEMS:    ROS  CONSTITUTIONAL: No documented fever. Has decreased fatigue, weakness. No weight gain, no weight loss.  EYES: No blurry or double vision.  ENT: No tinnitus. No postnasal drip. No redness of the oropharynx.  RESPIRATORY: No cough, no wheeze, no hemoptysis. No dyspnea.  CARDIOVASCULAR: No chest pain. No orthopnea. No palpitations. No syncope.  GASTROINTESTINAL: No nausea, no vomiting or diarrhea. No abdominal pain. No melena or hematochezia.  GENITOURINARY: No dysuria or hematuria.  ENDOCRINE: No polyuria or nocturia. No heat or cold intolerance.  HEMATOLOGY: No anemia. No bruising. No bleeding.  INTEGUMENTARY: No rashes. No lesions.  MUSCULOSKELETAL: No arthritis. No swelling. No gout.  NEUROLOGIC: No numbness, tingling, or ataxia. No seizure-type activity.  PSYCHIATRIC: No anxiety. No insomnia. No ADD.   DRUG ALLERGIES:   Allergies  Allergen Reactions  . Fentanyl Nausea Only    VITALS:  Blood pressure 133/76, pulse 77, temperature 99.1 F (37.3 C), temperature source Oral, resp. rate 19, height 6' (1.829 m), weight 91 kg, SpO2 95 %.  PHYSICAL EXAMINATION:   Physical Exam  GENERAL:  77 y.o.-year-old patient lying in the bed with no acute distress.  EYES: Pupils equal, round, reactive to light and accommodation. No scleral icterus. Extraocular muscles intact.  HEENT: Head atraumatic, normocephalic. Oropharynx and nasopharynx clear.  NECK:  Supple, no jugular venous distention. No thyroid enlargement, no tenderness.  LUNGS: Improved breath sounds bilaterally, scattered rales  heard. No use of accessory muscles of respiration.  CARDIOVASCULAR: S1, S2 normal. No murmurs, rubs, or gallops.  ABDOMEN: Soft, nontender, nondistended. Bowel sounds present. No organomegaly or mass.  EXTREMITIES: No cyanosis, clubbing or edema b/l.    NEUROLOGIC: Awake, alert and responds to verbal commands Moves extremities PSYCHIATRIC: mood pleasant, oriented to time place and person SKIN: No obvious rash, lesion, or ulcer.   LABORATORY PANEL:   CBC Recent Labs  Lab 01/31/18 0440  WBC 13.0*  HGB 13.8  HCT 40.9  PLT 250   ------------------------------------------------------------------------------------------------------------------ Chemistries  Recent Labs  Lab 01/29/18 2243  01/31/18 0440  NA 132*   < > 136  K 4.2   < > 4.0  CL 102   < > 107  CO2 21*   < > 21*  GLUCOSE 109*   < > 92  BUN 29*   < > 28*  CREATININE 1.03   < > 0.95  CALCIUM 8.9   < > 8.5*  AST 18  --   --   ALT 22  --   --   ALKPHOS 55  --   --   BILITOT 1.1  --   --    < > = values in this interval not displayed.   ------------------------------------------------------------------------------------------------------------------  Cardiac Enzymes Recent Labs  Lab 01/29/18 2243  TROPONINI 0.03*   ------------------------------------------------------------------------------------------------------------------  RADIOLOGY:  Dg Chest 2 View  Result Date: 01/30/2018 CLINICAL DATA:  Altered mental status EXAM: CHEST - 2 VIEW COMPARISON:  Chest radiograph 12/17/2009 FINDINGS: Low lung volumes. Stable cardiac and mediastinal contours. Tortuous thoracic aorta. Bibasilar atelectasis. No large area pulmonary consolidation. No pleural effusion  pneumothorax. Thoracic spine degenerative changes. IMPRESSION: No acute cardiopulmonary process. Electronically Signed   By: Lovey Newcomer M.D.   On: 01/30/2018 00:33   Ct Head Wo Contrast  Result Date: 01/29/2018 CLINICAL DATA:  Altered mental status for 3 days.   Recent stroke. EXAM: CT HEAD WITHOUT CONTRAST TECHNIQUE: Contiguous axial images were obtained from the base of the skull through the vertex without intravenous contrast. COMPARISON:  None. FINDINGS: BRAIN: 2.5 x 2.6 x 3.8 cm (volume = 13 cc) RIGHT occipital lobe mildly dense hematoma with surrounding low-density vasogenic edema. Effaced RIGHT occipital horn without intraventricular extension. Regional mass effect without midline shift. Moderate to severe parenchymal brain volume loss with disproportion sulcal effacement at the convexities associated with lobulated ventricular margin and narrowed callosum angle. Confluent supratentorial white matter hypodensities. No acute large vascular territory infarct. No abnormal extra-axial fluid collections. Basal cisterns are patent. VASCULAR: Mild calcific atherosclerosis of the carotid siphons. Dolichoectatic intracranial vessels seen with chronic hypertension. SKULL: No skull fracture. No significant scalp soft tissue swelling. SINUSES/ORBITS: Trace paranasal sinus mucosal thickening. Mastoid air cells are well aerated.The included ocular globes and orbital contents are non-suspicious. OTHER: None. IMPRESSION: 1. 13 cc RIGHT occipital lobe subacute appearing hematoma. Regional mass effect without midline shift. 2. 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 3. Severe chronic small vessel ischemic changes. 4. Critical Value/emergent results were called by telephone at the time of interpretation on 01/29/2018 at 11:49 pm to Dr. Charlesetta Ivory , who verbally acknowledged these results. Electronically Signed   By: Elon Alas M.D.   On: 01/29/2018 23:52     ASSESSMENT AND PLAN:  77 year old male patient with history of recent hemorrhagic stroke, hypertension, neurogenic bladder, renal cell cancer, hypertension currently under hospitalist service for encephalopathy, sepsis and UTI  -Acute metabolic  encephalopathy improved secondary to urinary tract infection Improving with IV antibiotics as infection is resolving Off sedation medication  -Acute urinary tract infection with gram-negative rods Citrobacter freundii  Follow-up culture and sensitivity Replaced Foley catheter Broad-spectrum IV antibiotics (cefepime) Follow-up cultures  -Leukocytosis improved  -Subacute intracranial bleed (occipital lobe bleed) CT head reviewed Discussed with neurology No new imaging studies recommended Monitor patient clinically Not on any blood thinners  -Speech therapy to do swallow study and start diet today as mental status is improved  -Discussed with patient's daughter medical condition treatment plan  [facility]  -Physical therapy evaluation  All the records are reviewed and case discussed with Care Management/Social Worker. Management plans discussed with the patient, family and they are in agreement.  CODE STATUS: DNR  DVT Prophylaxis: SCDs  TOTAL TIME TAKING CARE OF THIS PATIENT: 34 minutes.   POSSIBLE D/C IN 1 to 3 DAYS, DEPENDING ON CLINICAL CONDITION.  Saundra Shelling M.D on 01/31/2018 at 10:33 AM  Between 7am to 6pm - Pager - [phone]  After 6pm go to [email or web address] - password [email or web address] [place] [facility]  Office  [phone]  CC: Primary care physician; Bary Richard, NP  Note: This dictation was prepared with Dragon dictation along with smaller phrase technology. Any transcriptional errors that result from this process are unintentional.

## 2018-01-31 NOTE — NC FL2 (Signed)
[place] LEVEL OF CARE SCREENING TOOL     IDENTIFICATION  Patient Name: Adam Rios Birthdate: 02/16/1941 Sex: male Admission Date (Current Location): 01/29/2018  [place] and [place] Number:  Engineering geologist and Address:  [facility], [address] [place], [place] [address]      Provider Number: [id number]  Attending Physician Name and Address:  Saundra Shelling, MD  Relative Name and Phone Number:       Current Level of Care: Hospital Recommended Level of Care: [place] Prior Approval Number:    Date Approved/Denied:   PASRR Number: [id number])  Discharge Plan: SNF    Current Diagnoses: Patient Active Problem List   Diagnosis Date Noted  . Acute encephalopathy 01/30/2018  . Chronic kidney disease 04/05/2017  . GERD (gastroesophageal reflux disease) 04/05/2017  . Pain 04/05/2017  . Weakness 04/05/2017  . Essential hypertension 07/31/2014  . 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 Ventral hernia without obstruction or gangrene 04/10/2014  . Acute low back pain without sciatica 12/26/2013  . Fusion of spine of lumbar region 12/26/2013  . Edema 09/26/2013  . Hemorrhoid 09/26/2013  . Neurogenic bladder 09/26/2013  . Renal cell cancer ([place]) 09/26/2013  . 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 Lower urinary tract infectious disease 10/28/2012    Orientation RESPIRATION BLADDER Height & Weight     Self  Normal Incontinent, Indwelling catheter Weight: 200 lb 9.6 oz (91 kg) Height:  6' (182.9 cm)  BEHAVIORAL SYMPTOMS/MOOD NEUROLOGICAL BOWEL NUTRITION STATUS      Incontinent Diet(Diet: NPO to be advanced by SLP)  AMBULATORY STATUS COMMUNICATION OF NEEDS Skin   Extensive Assist Verbally Normal                       Personal Care Assistance Level of Assistance  Bathing, Feeding, Dressing Bathing Assistance: Limited assistance Feeding assistance: Independent Dressing Assistance: Limited  assistance     Functional Limitations Info  Sight, Hearing, Speech Sight Info: Adequate Hearing Info: Adequate Speech Info: Adequate    SPECIAL CARE FACTORS FREQUENCY  PT (By licensed PT), OT (By licensed OT)     PT Frequency: (5) OT Frequency: (5)            Contractures      Additional Factors Info  Code Status, Allergies, Isolation Precautions Code Status Info: (DNR ) Allergies Info: (Fentanyl)           Current Medications (01/31/2018):  This is the current hospital active medication list Current Facility-Administered Medications  Medication Dose Route Frequency Provider Last Rate Last Dose  . 0.9 %  sodium chloride infusion   Intravenous Continuous Saundra Shelling, MD 75 mL/hr at 01/31/18 0745    . acetaminophen (TYLENOL) tablet 650 mg  650 mg Oral Q6H PRN Amelia Jo, MD       Or  . acetaminophen (TYLENOL) suppository 650 mg  650 mg Rectal Q6H PRN Amelia Jo, MD      . amLODipine (NORVASC) tablet 10 mg  10 mg Oral Daily Amelia Jo, MD      . bisacodyl (DULCOLAX) EC tablet 5 mg  5 mg Oral Daily PRN Amelia Jo, MD      . ceFEPIme (MAXIPIME) 1 g in sodium chloride 0.9 % 100 mL IVPB  1 g Intravenous Q12H Hallaji, Sheema M, RPH 200 mL/hr at 01/30/18 2245 1 g at 01/30/18 2245  . Chlorhexidine Gluconate Cloth 2 % PADS 6 each  6 each Topical  [id number] Amelia Jo, MD   6 each at 01/31/18 0448  . docusate sodium (COLACE) capsule 100 mg  100 mg Oral BID Amelia Jo, MD      . HYDROcodone-acetaminophen (NORCO/VICODIN) 5-325 MG per tablet 1-2 tablet  1-2 tablet Oral Q4H PRN Amelia Jo, MD      . losartan (COZAAR) tablet 50 mg  50 mg Oral Daily Amelia Jo, MD      . morphine 2 MG/ML injection 2 mg  2 mg Intravenous Q6H PRN Saundra Shelling, MD   2 mg at 01/30/18 1808  . mupirocin ointment (BACTROBAN) 2 % 1 application  1 application Nasal BID Amelia Jo, MD   1 application at 62/95/28 2244  . ondansetron (ZOFRAN) tablet 4 mg  4 mg Oral Q6H PRN Amelia Jo, MD        Or  . ondansetron [facility]) injection 4 mg  4 mg Intravenous Q6H PRN Amelia Jo, MD      . pantoprazole (PROTONIX) EC tablet 40 mg  40 mg Oral Daily Amelia Jo, MD      . polyvinyl alcohol (LIQUIFILM TEARS) 1.4 % ophthalmic solution 1 drop  1 drop Both Eyes QID PRN Amelia Jo, MD      . traZODone (DESYREL) tablet 25 mg  25 mg Oral QHS PRN Amelia Jo, MD         Discharge Medications: Please see discharge summary for a list of discharge medications.  Relevant Imaging Results:  Relevant Lab Results:   Additional Information (SSN: [id number])  Lamel Mccarley, Veronia Beets, LCSW

## 2018-01-31 NOTE — Plan of Care (Signed)
  Problem: Clinical Measurements: Goal: Ability to maintain clinical measurements within normal limits will improve Outcome: Progressing Goal: Will remain free from infection Outcome: Progressing Goal: Diagnostic test results will improve Outcome: Progressing Goal: Respiratory complications will improve Outcome: Progressing   Problem: Activity: Goal: Risk for activity intolerance will decrease Outcome: Progressing   Problem: Pain Managment: Goal: General experience of comfort will improve Outcome: Progressing

## 2018-01-31 NOTE — Evaluation (Signed)
Clinical/Bedside Swallow Evaluation Patient Details  Name: Adam Rios MRN: [id number] Date of Birth: 11/01/1940  Today's Date: 01/31/2018 Time: SLP Start Time (ACUTE ONLY): 0815 SLP Stop Time (ACUTE ONLY): 0910 SLP Time Calculation (min) (ACUTE ONLY): 55 min  Past Medical History:  Past Medical History:  Diagnosis Date  . Cognitive impairment   . Hemorrhagic cerebrovascular accident (CVA) ([place])   . Hypertension   . Neurogenic bladder   . Renal cell carcinoma ([place])   . Renal disorder   . Stroke [facility])    Past Surgical History:  Past Surgical History:  Procedure Laterality Date  . APPENDECTOMY    . HERNIA REPAIR    . LUMBAR FUSION    . NEPHRECTOMY     HPI:  Pt is a 77 y.o. male with a known history of cognitive impairment(Dementia?), hypertension, neurogenic bladder, recent hemorrhagic stroke. He was transferred from nursing home to emergency room for altered mental status noticed by the nursing home staff in the past 2 to 3 days, gradually getting worse.  Apparently he used to self catheterize himself and he was also able to ambulate up until 2 days ago. No reports of fever, nausea, vomiting, diarrhea, bleeding. Brain CT shows 3 cc RIGHT occipital lobe subacute appearing hematoma. Regional mass effect without midline shift.  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  Severe chronic small vessel ischemic changes are noted.  Pt admitted w/ acute encephalopathy w/ some baseline Cognitive decline. Pt resides at an area SNF.    Assessment / Plan / Recommendation Clinical Impression  Pt appeared to present w/ fairly adequate oropharyngeal phase swallowing function w/ no immediate, overt s/s of aspiration noted. He does present w/ decreased Cognitive focus and awareness w/ tasks (baseline Cognitive Impairment per chart notes) and requires min-mod cues for follow through and attention to tasks. Pt was positioned upright  and fed boluses of thin liquids via Cup(he held himself), puree, and mech soft trials w/ no immediate, overt s/s of aspiration noted; no decline in vocal quality or respiratory presentation b/t trials. Pt did exhibit a delayed cough x1 but did not increase in frequency during further trials. During the oral phase, pt exhibited adequate bolus management w/ trial consistencies; noted min slower mastication and need for oral clearing time w/ increased textured trials(mech soft foods). Pt demonstrated timely A-P transfer and clearing overall w/ min time b/t trials - he does seem to have a decreased attention to task currently causing a slower presentation w/ all tasks and answering of basic questions. Pt required feeding support d/t Cognitive decline/status. OM exam appeared grossly [facility] for lingual/labial movements during bolus management and oral clearing. Recommend a Dysphagia level 3 w/ well-chopped meats; thin liquids via Cup. Recommend aspiration precautions; Pills in Puree - Crushed as needed for safer swallowing. Feeding assistance at all meals d/t Cognitive decline/status monitoring for impulsiveness and alertness during oral intake. NSG updated.  SLP Visit Diagnosis: Dysphagia, oropharyngeal phase (R13.12)(min)    Aspiration Risk  Mild aspiration risk(but reduced when following general precautions)    Diet Recommendation  Dysphagia level 3 w/ well-chopped meats, thin liquids - NO Straws. Aspiration precautions. Feeding support and monitoring at all meals d/t Cognitive decline/status at baseline  Medication Administration: Whole meds with puree(vs Crushed as needed for safer swallowing)    Other  Recommendations Recommended Consults: (Dietician f/u for any support) Oral Care Recommendations: Oral care BID;Staff/trained caregiver to provide oral care Other Recommendations: (n/a)  Follow up Recommendations (TBD)      Frequency and Duration min 3x week  2 weeks       Prognosis Prognosis for  Safe Diet Advancement: Fair Barriers to Reach Goals: Cognitive deficits;Time post onset;Severity of deficits      Swallow Study   General Date of Onset: 01/29/18 HPI: Pt is a 77 y.o. male with a known history of cognitive impairment(Dementia?), hypertension, neurogenic bladder, recent hemorrhagic stroke. He was transferred from nursing home to emergency room for altered mental status noticed by the nursing home staff in the past 2 to 3 days, gradually getting worse.  Apparently he used to self catheterize himself and he was also able to ambulate up until 2 days ago. No reports of fever, nausea, vomiting, diarrhea, bleeding. Brain CT shows 3 cc RIGHT occipital lobe subacute appearing hematoma. Regional mass effect without midline shift.  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  Severe chronic small vessel ischemic changes are noted.  Pt admitted w/ acute encephalopathy w/ some baseline Cognitive decline. Pt resides at an area SNF.  Type of Study: Bedside Swallow Evaluation Previous Swallow Assessment: none reported Diet Prior to this Study: NPO(a regular diet at home per pt report) Temperature Spikes Noted: (wbc 13.0; temp 99.1) Respiratory Status: Nasal cannula(2 liters) History of Recent Intubation: No Behavior/Cognition: Cooperative;Pleasant mood;Confused;Distractible;Requires cueing(Awake, but min drowsy intermittently) Oral Cavity Assessment: Dry Oral Care Completed by SLP: Recent completion by staff Oral Cavity - Dentition: Adequate natural dentition;Missing dentition(few) Vision: Functional for self-feeding Self-Feeding Abilities: Able to feed self;Needs assist;Needs set up;Total assist(overall weakness) Patient Positioning: Upright in bed(supported ) Baseline Vocal Quality: Normal Volitional Cough: Strong;Congested(min) Volitional Swallow: Able to elicit    Oral/Motor/Sensory Function Overall Oral  Motor/Sensory Function: Within functional limits(no inilateral weakness noted during bolus management, speech)   Ice Chips Ice chips: Within functional limits Presentation: Spoon(fed; 3 trials)   Thin Liquid Thin Liquid: Impaired Presentation: Cup;Self Fed(monitored; ~3 ozs total) Oral Phase Impairments: (distracted) Pharyngeal  Phase Impairments: Multiple swallows;Cough - Delayed(both occurred x1) Other Comments: pt given verbal cues to attend to task of drinking liquids intermittently    Nectar Thick Nectar Thick Liquid: Not tested   Honey Thick Honey Thick Liquid: Not tested   Puree Puree: Within functional limits Presentation: Spoon(fed; 8 trials ) Other Comments: adequate   Solid     Solid: Impaired Presentation: Spoon(fed; 6 trials of mech soft foods) Oral Phase Impairments: Impaired mastication(min lengthier time needed) Oral Phase Functional Implications: Prolonged oral transit Pharyngeal Phase Impairments: (none) Other Comments: cues to attend to task       Orinda Kenner, MS, CCC-SLP Jaxson Anglin 01/31/2018,1:36 PM

## 2018-01-31 NOTE — Progress Notes (Signed)
Advanced care plan. Purpose of the Encounter: CODE STATUS Parties in Attendance:Patient and family Patient's Decision Capacity:Not good Subjective/Patient's story: Presented for confusion, change in mental status Objective/Medical story Has encephalopathy secondary to UTI Has sepsis, leucocytosis Needs IV antibiotics Goals of care determination:  Advance care directives and goals of care discussed Patients family do not want any cpr, intubation if need arises CODE STATUS: DNR Time spent discussing advanced care planning: 16 minutes

## 2018-01-31 NOTE — Progress Notes (Signed)
Upon assuming care for this patient it was noted that there was no urine at all in foley catheter.  Catheter was completely clean.  Attempted to adjust catheter with no improvement.  Foley flushed with no improvement.  Bladder scan showed 295 cc of urine.  Blood and pus noted to be leaking around foley insertion site at urethra. Foley removed and attempted to replace it with new one. This was tried twice with no urine flashback. Urethra bleeding.  Notifed Dr. Leslye Peer.  Urology consulted.  Dr. Donell Sievert came up to see patient and insert a coude catheter with no problem.  Urine draining well into foley.

## 2018-02-01 LAB — GLUCOSE, CAPILLARY: GLUCOSE-CAPILLARY: 92 mg/dL (ref 70–99)

## 2018-02-01 LAB — CBC
HEMATOCRIT: 39.2 % — AB (ref 40.0–52.0)
HEMOGLOBIN: 13.7 g/dL (ref 13.0–18.0)
MCH: 30.3 pg (ref 26.0–34.0)
MCHC: 35.1 g/dL (ref 32.0–36.0)
MCV: 86.4 fL (ref 80.0–100.0)
Platelets: 234 10*3/uL (ref 150–440)
RBC: 4.53 MIL/uL (ref 4.40–5.90)
RDW: 13.3 % (ref 11.5–14.5)
WBC: 11 10*3/uL — ABNORMAL HIGH (ref 3.8–10.6)

## 2018-02-01 LAB — URINE CULTURE

## 2018-02-01 MED ORDER — SULFAMETHOXAZOLE-TRIMETHOPRIM 800-160 MG PO TABS
1.0000 | ORAL_TABLET | Freq: Two times a day (BID) | ORAL | Status: AC
  Administered 2018-02-01 – 2018-02-03 (×6): 1 via ORAL
  Filled 2018-02-01 (×6): qty 1

## 2018-02-01 MED ORDER — SULFAMETHOXAZOLE-TRIMETHOPRIM 800-160 MG PO TABS: 1.0000 | ORAL_TABLET | Freq: Two times a day (BID) | ORAL | 0 refills | Status: DC

## 2018-02-01 MED ORDER — CEPHALEXIN 500 MG PO CAPS: 500.0000 mg | ORAL_CAPSULE | Freq: Four times a day (QID) | ORAL | Status: DC

## 2018-02-01 MED ORDER — CEPHALEXIN 500 MG PO CAPS: 500.0000 mg | ORAL_CAPSULE | Freq: Four times a day (QID) | ORAL | 0 refills | Status: DC

## 2018-02-01 NOTE — Clinical Social Work Note (Signed)
CSW awaiting insurance authorization for patient to go to [facility].  Jones Broom. [place], MSW, [place]  02/01/2018 2:27 PM

## 2018-02-01 NOTE — Progress Notes (Signed)
[facility] [place] NAME: Adam Rios    MR#:  [id number]  DATE OF BIRTH:  1941/02/25  SUBJECTIVE:  States he is doing fine today. Denies fevers or chills. Ready to go back to SNF today.  REVIEW OF SYSTEMS:    ROS  CONSTITUTIONAL: No fever. +weakness. No weight gain, no weight loss.  EYES: No blurry or double vision.  ENT: No tinnitus. No postnasal drip. No redness of the oropharynx.  RESPIRATORY: No cough, no wheeze, no hemoptysis. No dyspnea.  CARDIOVASCULAR: No chest pain. No orthopnea. No palpitations. No syncope.  GASTROINTESTINAL: No nausea, no vomiting or diarrhea. No abdominal pain. No melena or hematochezia.  GENITOURINARY: No dysuria or hematuria.  ENDOCRINE: No polyuria or nocturia. No heat or cold intolerance.  HEMATOLOGY: No anemia. No bruising. No bleeding.  INTEGUMENTARY: No rashes. No lesions.  MUSCULOSKELETAL: No arthritis. No swelling. No gout.  NEUROLOGIC: No numbness, tingling, or ataxia. No seizure-type activity.  PSYCHIATRIC: No anxiety. No insomnia. No ADD.   DRUG ALLERGIES:   Allergies  Allergen Reactions  . Fentanyl Nausea Only    VITALS:  Blood pressure 130/82, pulse 72, temperature 98 F (36.7 C), temperature source Oral, resp. rate 18, height 6' (1.829 m), weight 92.5 kg, SpO2 98 %.  PHYSICAL EXAMINATION:   Physical Exam  GENERAL:  77 y.o.-year-old patient sitting up in bed, in no acute distress.  EYES: Pupils equal, round, reactive to light and accommodation. No scleral icterus. Extraocular muscles intact.  HEENT: Head atraumatic, normocephalic. Oropharynx and nasopharynx clear.  NECK:  Supple, no jugular venous distention. No thyroid enlargement, no tenderness.  LUNGS: lungs CTAB. No use of accessory muscles of respiration.  CARDIOVASCULAR: S1, S2 normal. No murmurs, rubs, or gallops.  ABDOMEN: Soft, nontender, nondistended. Bowel sounds present. No organomegaly or mass.  EXTREMITIES: No  cyanosis, clubbing or edema b/l.    NEUROLOGIC: Awake, alert and responds to verbal commands Moves extremities PSYCHIATRIC: mood pleasant, oriented x 3. SKIN: No obvious rash, lesion, or ulcer.   LABORATORY PANEL:   CBC Recent Labs  Lab 02/01/18 0404  WBC 11.0*  HGB 13.7  HCT 39.2*  PLT 234   ------------------------------------------------------------------------------------------------------------------ Chemistries  Recent Labs  Lab 01/29/18 2243  01/31/18 0440  NA 132*   < > 136  K 4.2   < > 4.0  CL 102   < > 107  CO2 21*   < > 21*  GLUCOSE 109*   < > 92  BUN 29*   < > 28*  CREATININE 1.03   < > 0.95  CALCIUM 8.9   < > 8.5*  AST 18  --   --   ALT 22  --   --   ALKPHOS 55  --   --   BILITOT 1.1  --   --    < > = values in this interval not displayed.   ------------------------------------------------------------------------------------------------------------------  Cardiac Enzymes Recent Labs  Lab 01/29/18 2243  TROPONINI 0.03*   ------------------------------------------------------------------------------------------------------------------  RADIOLOGY:  No results found.   ASSESSMENT AND PLAN:  77 year old male patient with history of recent hemorrhagic stroke, hypertension, neurogenic bladder, renal cell cancer, hypertension currently under hospitalist service for encephalopathy, sepsis and UTI  Acute metabolic encephalopathysecondary to UTI- resolved. Patient alert and oriented x 3 this morning. - holding Baclofen and Zanaflex   Citrobacter UTI- no signs of sepsis - urine cultures grew >100,000 CFU of Citrobacter - blood cultures were negative - switch from cefepime  to septra bid for a total of 7 days  Neurogenic bladder- intermittent self-catheterization at home - foley in place - needs f/u with urologist on discharge  Subacute intracranial bleed- patient recently hospitalized for hemorrhagic stroke - CT head showed 3cc right occipital  lobe subacute hematoma - discussed with patient's neurologist (Dr. Dahlia Client) who felt that it is unlikely that patient's AMS is related to intracranial process at this time. Did not recommend any new imaging studies. - hold anticoagulation and NSAIDs  Dysphagia - SLP consulted- recommend dysphagia III diet with well-chopped meats and thin liquids  All the records are reviewed and case discussed with Care Management/Social Worker. Management plans discussed with the patient, family and they are in agreement.  CODE STATUS: DNR  DVT Prophylaxis: SCDs due to recent hemorrhagic stroke  TOTAL TIME TAKING CARE OF THIS PATIENT: 40 minutes.   POSSIBLE D/C tomorrow- awaiting insurance authorization for return to SNF.  Berna Spare Mayo M.D on 02/01/2018 at 3:37 PM  Between 7am to 6pm - Pager - [phone]  After 6pm go to [email or web address] - password [email or web address] [facility]  Office  [phone]  CC: Primary care physician; Bary Richard, NP  Note: This dictation was prepared with Dragon dictation along with smaller phrase technology. Any transcriptional errors that result from this process are unintentional.

## 2018-02-01 NOTE — Discharge Summary (Deleted)
[facility] [place] NAME: Adam Rios    MR#:  [id number]  DATE OF BIRTH:  Sep 28, 1940  DATE OF ADMISSION:  01/29/2018   ADMITTING PHYSICIAN: Amelia Jo, MD  DATE OF DISCHARGE: 02/01/18  PRIMARY CARE PHYSICIAN: Bary Richard, NP   ADMISSION DIAGNOSIS:  Altered mental status, unspecified altered mental status type [R41.82] Urinary tract infection associated with catheterization of urinary tract, unspecified indwelling urinary catheter type, initial encounter ([place]) [T83.511A, N39.0] DISCHARGE DIAGNOSIS:  Active Problems:   Acute encephalopathy  SECONDARY DIAGNOSIS:   Past Medical History:  Diagnosis Date  . Cognitive impairment   . Hemorrhagic cerebrovascular accident (CVA) ([place])   . Hypertension   . Neurogenic bladder   . Renal cell carcinoma ([place])   . Renal disorder   . Stroke [facility])    HOSPITAL COURSE:   Adam Rios is a 77 year old male with a PMH of cognitive impairment, HTN, neurogenic bladder (intermittent self-catheterization), recent hemorrhagic stroke who presented to the ED with AMS. In the ED, WBC count was elevated to 21.5 and UA was consistent with UTI. CT head was performed due to recent hx of hemorrhagic stroke and showed 3cc right occipital lobe subacute hematoma. He was admitted for further management.  Acute metabolic encephalopathy secondary to UTI- resolved on the day of discharge. Patient alert and oriented x 3. - Baclofen and Zanaflex held during admission and on discharge due to AMS, but can be restarted as an outpatient.  Citrobacter UTI - urine cultures grew >100,000 CFU of Citrobacter - blood cultures were negative - initially treated with cefepime and transitioned to septra bid for a total 7 day course  Neurogenic bladder- does intermittent self-catheterization at home - foley placed during hospitalization and removed on discharge - patient to resume intermittent self-catheterization at home - needs  f/u with urologist on discharge  Subacute intracranial bleed- patient recently hospitalized for hemorrhagic stroke - CT head showed 3cc right occipital lobe subacute hematoma - discussed with patient's neurologist (Dr. Dahlia Client) who felt that it is unlikely that patient's AMS is related to intracranial process at this time. Did not recommend any new imaging studies. - not treated with any anticoagulation and aspirin 81 held on discharge  Dysphagia - seen by SLP this admission, recommended the following: Dysphagia level 3 w/ well-chopped meats, thin liquids - NO Straws. Aspiration precautions. Feeding support and monitoring at all meals d/t Cognitive decline/status at baseline  DISCHARGE CONDITIONS:  Citrobacter UTI Neurogenic bladder Subacute intracranial bleed Dysphagia  CONSULTS OBTAINED:  Treatment Team:  Leotis Pain, MD Festus Aloe, MD DRUG ALLERGIES:   Allergies  Allergen Reactions  . Fentanyl Nausea Only   DISCHARGE MEDICATIONS:   Allergies as of 02/01/2018      Reactions   Fentanyl Nausea Only      Medication List    STOP taking these medications   aspirin EC 81 MG tablet   baclofen 10 MG tablet Commonly known as:  LIORESAL   tizanidine 2 MG capsule Commonly known as:  ZANAFLEX     TAKE these medications   acetaminophen 325 MG tablet Commonly known as:  TYLENOL Take 650 mg by mouth.   amLODipine 10 MG tablet Commonly known as:  NORVASC Take 10 mg by mouth daily.   carboxymethylcellulose 0.5 % Soln Commonly known as:  REFRESH PLUS Apply 1 drop to eye 4 (four) times daily as needed.   docusate sodium 100 MG capsule Commonly known as:  COLACE Take 100  mg by mouth 2 (two) times daily.   guaiFENesin 100 MG/5ML Soln Commonly known as:  ROBITUSSIN Take 5 mLs every 4 (four) hours as needed by mouth for cough or to loosen phlegm.   losartan 25 MG tablet Commonly known as:  COZAAR Take 50 mg by mouth daily.   oxybutynin 5 MG 24 hr  tablet Commonly known as:  DITROPAN-XL Take 5 mg by mouth daily.   pantoprazole 40 MG tablet Commonly known as:  PROTONIX Take 40 mg daily by mouth.   POLYETHYLENE GLYCOL 3350 PO Take by mouth.   senna 8.6 MG tablet Commonly known as:  SENOKOT Take by mouth.   sulfamethoxazole-trimethoprim 800-160 MG tablet Commonly known as:  BACTRIM DS,SEPTRA DS Take 1 tablet by mouth every 12 (twelve) hours.       DISCHARGE INSTRUCTIONS:  1. F/u with PCP in 1-2 weeks 2. F/u with urologist in 1-2 weeks 3. Aspirin 81mg  discontinued due to recent hemorrhagic stroke 4. zanaflex and baclofen held on discharge due to AMS, can restart as outpatient 5. Urine culture grew citrobacter- patient discharged on septra for 5 additional days (total 7 day course of antibiotics) 6. Can resume intermittent catheterization on discharge DIET:  Dysphagia level 3 w/ well-chopped meats, thin liquids - NO Straws. Aspiration precautions. Feeding support and monitoring at all meals d/t Cognitive decline/status at baseline DISCHARGE CONDITION:  Stable ACTIVITY:  Activity as tolerated OXYGEN:  Home Oxygen: No.  Oxygen Delivery: room air DISCHARGE LOCATION:  nursing home   If you experience worsening of your admission symptoms, develop shortness of breath, life threatening emergency, suicidal or homicidal thoughts you must seek medical attention immediately by calling 911 or calling your MD immediately  if symptoms less severe.  You Must read complete instructions/literature along with all the possible adverse reactions/side effects for all the Medicines you take and that have been prescribed to you. Take any new Medicines after you have completely understood and accpet all the possible adverse reactions/side effects.   Please note  You were cared for by a hospitalist during your hospital stay. If you have any questions about your discharge medications or the care you received while you were in the hospital after  you are discharged, you can call the unit and asked to speak with the hospitalist on call if the hospitalist that took care of you is not available. Once you are discharged, your primary care physician will handle any further medical issues. Please note that NO REFILLS for any discharge medications will be authorized once you are discharged, as it is imperative that you return to your primary care physician (or establish a relationship with a primary care physician if you do not have one) for your aftercare needs so that they can reassess your need for medications and monitor your lab values.    On the day of Discharge:  VITAL SIGNS:  Blood pressure 130/82, pulse 72, temperature 98 F (36.7 C), temperature source Oral, resp. rate 18, height 6' (1.829 m), weight 92.5 kg, SpO2 98 %. PHYSICAL EXAMINATION:  GENERAL:  77 y.o.-year-old patient lying in the bed with no acute distress.  EYES: Pupils equal, round, reactive to light and accommodation. No scleral icterus. Extraocular muscles intact.  HEENT: Head atraumatic, normocephalic. Oropharynx and nasopharynx clear.  NECK:  Supple, no jugular venous distention. No thyroid enlargement, no tenderness.  LUNGS: Normal breath sounds bilaterally, no wheezing, rales, rhonchi or crepitation. No use of accessory muscles of respiration.  CARDIOVASCULAR: S1, S2 normal. No murmurs,  rubs, or gallops.  ABDOMEN: Soft, non-tender, non-distended. Bowel sounds present. No organomegaly or mass.  GENITOURINARY: foley in place EXTREMITIES: No pedal edema, cyanosis, or clubbing.  NEUROLOGIC: Cranial nerves II through XII are intact. Muscle strength 5/5 in all extremities. Sensation intact. Gait not checked.  PSYCHIATRIC: The patient is alert and oriented x 3.  SKIN: No obvious rash, lesion, or ulcer.  DATA REVIEW:   CBC Recent Labs  Lab 02/01/18 0404  WBC 11.0*  HGB 13.7  HCT 39.2*  PLT 234    Chemistries  Recent Labs  Lab 01/29/18 2243  01/31/18 0440  NA  132*   < > 136  K 4.2   < > 4.0  CL 102   < > 107  CO2 21*   < > 21*  GLUCOSE 109*   < > 92  BUN 29*   < > 28*  CREATININE 1.03   < > 0.95  CALCIUM 8.9   < > 8.5*  AST 18  --   --   ALT 22  --   --   ALKPHOS 55  --   --   BILITOT 1.1  --   --    < > = values in this interval not displayed.     Microbiology Results  Results for orders placed or performed during the hospital encounter of 01/29/18  Urine culture     Status: Abnormal   Collection Time: 01/29/18 10:55 PM  Result Value Ref Range Status   Specimen Description   Final    URINE, RANDOM Performed at [facility], [address], [place], [place] [address]    Special Requests   Final    NONE Performed at [facility], [facility], [address] [place], [place] [address]    Culture >=100,000 COLONIES/mL CITROBACTER FREUNDII (A)  Final   Report Status 02/01/2018 FINAL  Final   Organism ID, Bacteria CITROBACTER FREUNDII (A)  Final      Susceptibility   Citrobacter freundii - MIC*    CEFAZOLIN >=64 RESISTANT Resistant     CEFTRIAXONE <=1 SENSITIVE Sensitive     CIPROFLOXACIN <=0.25 SENSITIVE Sensitive     GENTAMICIN <=1 SENSITIVE Sensitive     IMIPENEM 0.5 SENSITIVE Sensitive     NITROFURANTOIN <=16 SENSITIVE Sensitive     TRIMETH/SULFA <=20 SENSITIVE Sensitive     PIP/TAZO <=4 SENSITIVE Sensitive     * >=100,000 COLONIES/mL CITROBACTER FREUNDII  Culture, blood (Routine x 2)     Status: None (Preliminary result)   Collection Time: 01/29/18 10:56 PM  Result Value Ref Range Status   Specimen Description BLOOD RIGHT FOREARM  Final   Special Requests   Final    BOTTLES DRAWN AEROBIC AND ANAEROBIC Blood Culture results may not be optimal due to an excessive volume of blood received in culture bottles   Culture   Final    NO GROWTH 3 DAYS Performed at [facility], [address], [place], [place] [address]    Report Status PENDING  Incomplete  Culture, blood (Routine x 2)     Status: None  (Preliminary result)   Collection Time: 01/29/18 10:56 PM  Result Value Ref Range Status   Specimen Description BLOOD LEFT FOREARM  Final   Special Requests   Final    BOTTLES DRAWN AEROBIC AND ANAEROBIC Blood Culture results may not be optimal due to an excessive volume of blood received in culture bottles   Culture   Final    NO GROWTH  3 DAYS Performed at [facility], [address] [place], [place] [address]    Report Status PENDING  Incomplete  MRSA PCR Screening     Status: Abnormal   Collection Time: 01/30/18  4:21 AM  Result Value Ref Range Status   MRSA by PCR POSITIVE (A) NEGATIVE Final    Comment:        The GeneXpert MRSA Assay (FDA approved for NASAL specimens only), is one component of a comprehensive MRSA colonization surveillance program. It is not intended to diagnose MRSA infection nor to guide or monitor treatment for MRSA infections. RESULT CALLED TO, READ BACK BY AND VERIFIED WITH: CARA CAMPBELL AT 0626 ON 01/30/18 BY [facility] Performed at [facility], [address] [place],  [address]     RADIOLOGY:  No results found.   Management plans discussed with the patient, family and they are in agreement.  CODE STATUS: DNR   TOTAL TIME TAKING CARE OF THIS PATIENT: 40 minutes.    Berna Spare Mayo M.D on 02/01/2018 at 10:13 AM  Between 7am to 6pm - Pager - [phone]  After 6pm go to [email or web address] - Proofreader  [facility]  Office  [phone]  CC: Primary care physician; Bary Richard, NP   Note: This dictation was prepared with Dragon dictation along with smaller phrase technology. Any transcriptional errors that result from this process are unintentional.

## 2018-02-01 NOTE — Discharge Summary (Addendum)
[facility] Rios at [place] NAME: Adam Rios    MR#:  [id number]  DATE OF BIRTH:  30-Apr-1941  DATE OF ADMISSION:  01/29/2018      ADMITTING PHYSICIAN: Amelia Jo, MD  DATE OF DISCHARGE: 02/02/18  PRIMARY CARE PHYSICIAN: Bary Richard, NP   ADMISSION DIAGNOSIS:  Altered mental status, unspecified altered mental status type [R41.82] Urinary tract infection associated with catheterization of urinary tract, unspecified indwelling urinary catheter type, initial encounter ([place]) [T83.511A, N39.0] DISCHARGE DIAGNOSIS:  Active Problems:   Acute encephalopathy  SECONDARY DIAGNOSIS:       Past Medical History:  Diagnosis Date  . Cognitive impairment   . Hemorrhagic cerebrovascular accident (CVA) ([place])   . Hypertension   . Neurogenic bladder   . Renal cell carcinoma ([place])   . Renal disorder   . Stroke [facility])    HOSPITAL COURSE:   Adam Rios is a 77 year old male with a PMH of cognitive impairment, HTN, neurogenic bladder (intermittent self-catheterization), recent hemorrhagic stroke who presented to the ED with AMS. In the ED, WBC count was elevated to 21.5 and UA was consistent with UTI. CT head was performed due to recent hx of hemorrhagic stroke and showed 3cc right occipital lobe subacute hematoma. He was admitted for further management.  Acute metabolic encephalopathy secondary to UTI- resolved on the day of discharge. Patient alert and oriented x 3. - Baclofen and Zanaflex held during admission and on discharge due to AMS, but can be restarted as an outpatient.  Citrobacter UTI - urine cultures grew >100,000 CFU of Citrobacter - blood cultures were negative - initially treated with cefepime and transitioned to septra bid for a total 7 day course per urine culture susceptibilities  Neurogenic bladder- does intermittent self-catheterization at home - foley placed during hospitalization and removed on discharge -  patient to resume intermittent self-catheterization at home - needs f/u with urologist on discharge  Subacute intracranial bleed- patient recently hospitalized for hemorrhagic stroke - CT head showed 3cc right occipital lobe subacute hematoma - discussed with patient's neurologist (Dr. Dahlia Client) who felt that it is unlikely that patient's AMS is related to intracranial process. Did not recommend any new imaging studies. - not treated with any anticoagulation during hospitalization - aspirin 81 held on discharge  Dysphagia - seen by SLP this admission, recommended the following: Dysphagia level 3 w/ well-chopped meats, thin liquids - NO Straws. Aspiration precautions. Feeding support and monitoring at all meals d/t Cognitive decline/status at baseline - needs assistance with feeding  DISCHARGE CONDITIONS:  Citrobacter UTI Neurogenic bladder Subacute intracranial bleed Dysphagia  CONSULTS OBTAINED:  Treatment Team:  Leotis Pain, MD Festus Aloe, MD DRUG ALLERGIES:       Allergies  Allergen Reactions  . Fentanyl Nausea Only   DISCHARGE MEDICATIONS:        Allergies as of 02/01/2018      Reactions   Fentanyl Nausea Only         Medication List    STOP taking these medications   aspirin EC 81 MG tablet   baclofen 10 MG tablet Commonly known as:  LIORESAL   tizanidine 2 MG capsule Commonly known as:  ZANAFLEX     TAKE these medications   acetaminophen 325 MG tablet Commonly known as:  TYLENOL Take 650 mg by mouth.   amLODipine 10 MG tablet Commonly known as:  NORVASC Take 10 mg by mouth daily.   carboxymethylcellulose 0.5 % Soln Commonly known as:  REFRESH PLUS Apply 1 drop to eye 4 (four) times daily as needed.   docusate sodium 100 MG capsule Commonly known as:  COLACE Take 100 mg by mouth 2 (two) times daily.   guaiFENesin 100 MG/5ML Soln Commonly known as:  ROBITUSSIN Take 5 mLs every 4 (four) hours as needed by mouth for  cough or to loosen phlegm.   losartan 25 MG tablet Commonly known as:  COZAAR Take 50 mg by mouth daily.   oxybutynin 5 MG 24 hr tablet Commonly known as:  DITROPAN-XL Take 5 mg by mouth daily.   pantoprazole 40 MG tablet Commonly known as:  PROTONIX Take 40 mg daily by mouth.   POLYETHYLENE GLYCOL 3350 PO Take by mouth.   senna 8.6 MG tablet Commonly known as:  SENOKOT Take by mouth.   sulfamethoxazole-trimethoprim 800-160 MG tablet Commonly known as:  BACTRIM DS,SEPTRA DS Take 1 tablet by mouth every 12 (twelve) hours.       DISCHARGE INSTRUCTIONS:  1. F/u with PCP in 1-2 weeks 2. F/u with urologist in 1-2 weeks 3. Aspirin 81mg  discontinued due to recent hemorrhagic stroke 4. zanaflex and baclofen held on discharge due to AMS, can restart as outpatient 5. Urine culture grew citrobacter- patient discharged on septra for 4 additional days (total 7 day course of antibiotics) 6. Can resume intermittent catheterization on discharge 7. Needs assistance with feeding at SNF DIET:  Dysphagia level 3 w/ well-chopped meats, thin liquids - NO Straws. Aspiration precautions. Feeding support and monitoring at all meals d/t Cognitive decline/status at baseline DISCHARGE CONDITION:  Stable ACTIVITY:  Activity as tolerated OXYGEN:  Home Oxygen: No.  Oxygen Delivery: room air DISCHARGE LOCATION:  nursing home   If you experience worsening of your admission symptoms, develop shortness of breath, life threatening emergency, suicidal or homicidal thoughts you must seek medical attention immediately by calling 911 or calling your MD immediately  if symptoms less severe.  You Must read complete instructions/literature along with all the possible adverse reactions/side effects for all the Medicines you take and that have been prescribed to you. Take any new Medicines after you have completely understood and accpet all the possible adverse reactions/side effects.   Please  note  You were cared for by a hospitalist during your hospital stay. If you have any questions about your discharge medications or the care you received while you were in the hospital after you are discharged, you can call the unit and asked to speak with the hospitalist on call if the hospitalist that took care of you is not available. Once you are discharged, your primary care physician will handle any further medical issues. Please note that NO REFILLS for any discharge medications will be authorized once you are discharged, as it is imperative that you return to your primary care physician (or establish a relationship with a primary care physician if you do not have one) for your aftercare needs so that they can reassess your need for medications and monitor your lab values.    On the day of Discharge:  VITAL SIGNS:  Blood pressure 130/82, pulse 72, temperature 98 F (36.7 C), temperature source Oral, resp. rate 18, height 6' (1.829 m), weight 92.5 kg, SpO2 98 %. PHYSICAL EXAMINATION:  GENERAL:  77 y.o.-year-old patient lying in the bed with no acute distress. Sleepy but easily awakens to voice. Answers questions appropriately. EYES: Pupils equal, round, reactive to light and accommodation. No scleral icterus. Extraocular muscles intact.  HEENT: Head atraumatic, normocephalic. Oropharynx  and nasopharynx clear.  NECK:  Supple, no jugular venous distention. No thyroid enlargement, no tenderness.  LUNGS: Normal breath sounds bilaterally, no wheezing, rales, rhonchi or crepitation. No use of accessory muscles of respiration.  CARDIOVASCULAR: S1, S2 normal. No murmurs, rubs, or gallops.  ABDOMEN: Soft, non-tender, non-distended. Bowel sounds present. No organomegaly or mass.  GENITOURINARY: foley in place EXTREMITIES: No pedal edema, cyanosis, or clubbing.  NEUROLOGIC: Cranial nerves II through XII are intact. +global weakness. Sensation intact. Gait not checked.  PSYCHIATRIC: The patient is  alert and oriented x 3.  SKIN: No obvious rash, lesion, or ulcer.  DATA REVIEW:   CBC LastLabs     Recent Labs  Lab 02/01/18 0404  WBC 11.0*  HGB 13.7  HCT 39.2*  PLT 234      Chemistries  LastLabs       Recent Labs  Lab 01/29/18 2243  01/31/18 0440  NA 132*   < > 136  K 4.2   < > 4.0  CL 102   < > 107  CO2 21*   < > 21*  GLUCOSE 109*   < > 92  BUN 29*   < > 28*  CREATININE 1.03   < > 0.95  CALCIUM 8.9   < > 8.5*  AST 18  --   --   ALT 22  --   --   ALKPHOS 55  --   --   BILITOT 1.1  --   --    < > = values in this interval not displayed.       Microbiology Results         Results for orders placed or performed during the hospital encounter of 01/29/18  Urine culture     Status: Abnormal   Collection Time: 01/29/18 10:55 PM  Result Value Ref Range Status   Specimen Description   Final    URINE, RANDOM Performed at [facility], [address], [place], [place] [address]    Special Requests   Final    NONE Performed at [facility], [address] [place], [place] [address]    Culture >=100,000 COLONIES/mL CITROBACTER FREUNDII (A)  Final   Report Status 02/01/2018 FINAL  Final   Organism ID, Bacteria CITROBACTER FREUNDII (A)  Final      Susceptibility   Citrobacter freundii - MIC*    CEFAZOLIN >=64 RESISTANT Resistant     CEFTRIAXONE <=1 SENSITIVE Sensitive     CIPROFLOXACIN <=0.25 SENSITIVE Sensitive     GENTAMICIN <=1 SENSITIVE Sensitive     IMIPENEM 0.5 SENSITIVE Sensitive     NITROFURANTOIN <=16 SENSITIVE Sensitive     TRIMETH/SULFA <=20 SENSITIVE Sensitive     PIP/TAZO <=4 SENSITIVE Sensitive     * >=100,000 COLONIES/mL CITROBACTER FREUNDII  Culture, blood (Routine x 2)     Status: None (Preliminary result)   Collection Time: 01/29/18 10:56 PM  Result Value Ref Range Status   Specimen Description BLOOD RIGHT FOREARM  Final   Special Requests   Final     BOTTLES DRAWN AEROBIC AND ANAEROBIC Blood Culture results may not be optimal due to an excessive volume of blood received in culture bottles   Culture   Final    NO GROWTH 3 DAYS Performed at [facility], [address], [place], [place] [address]    Report Status PENDING  Incomplete  Culture, blood (Routine x 2)     Status: None (Preliminary result)   Collection Time: 01/29/18  10:56 PM  Result Value Ref Range Status   Specimen Description BLOOD LEFT FOREARM  Final   Special Requests   Final    BOTTLES DRAWN AEROBIC AND ANAEROBIC Blood Culture results may not be optimal due to an excessive volume of blood received in culture bottles   Culture   Final    NO GROWTH 3 DAYS Performed at [facility], [address], [place], [place] [address]    Report Status PENDING  Incomplete  MRSA PCR Screening     Status: Abnormal   Collection Time: 01/30/18  4:21 AM  Result Value Ref Range Status   MRSA by PCR POSITIVE (A) NEGATIVE Final    Comment:        The GeneXpert MRSA Assay (FDA approved for NASAL specimens only), is one component of a comprehensive MRSA colonization surveillance program. It is not intended to diagnose MRSA infection nor to guide or monitor treatment for MRSA infections. RESULT CALLED TO, READ BACK BY AND VERIFIED WITH: CARA CAMPBELL AT 0626 ON 01/30/18 BY [facility] Performed at [facility], [address] [place], [place] [address]     RADIOLOGY:  No results found.   Management plans discussed with the patient, family and they are in agreement.  CODE STATUS: DNR   TOTAL TIME TAKING CARE OF THIS PATIENT: 40 minutes.    Berna Spare Mayo M.D on 02/02/18 at 9:24AM  Between 7am to 6pm - Pager - [phone]  After 6pm go to [email or web address] - Proofreader  [facility]  Office  [phone]  CC: Primary care physician; Bary Richard, NP   Note: This dictation  was prepared with Dragon dictation along with smaller phrase technology. Any transcriptional errors that result from this process are unintentional.

## 2018-02-01 NOTE — Evaluation (Signed)
Physical Therapy Evaluation Patient Details Name: Adam Rios MRN: [id number] DOB: 27-Apr-1941 Today's Date: 02/01/2018   History of Present Illness  Pt admitted for acute encephalopathy with complaints of AMS and UTI. History includes cognitive impairment, HTN, neurogenic bladder, and recent CVA.  Clinical Impression  Pt is a pleasant 77 year old male who was admitted for acute encephalopathy and now UTI. Pt performs bed mobility with max/total assist and is unable to reach EOB to perform OOB mobility. Pt demonstrates deficits with strength/mobility/cognition. Pt with increased weakness in B LE>B UE. Unsure of PLOF as pt is poor historian. Reports he is ambulatory, however short distances. Does not appear to be at baseline level at this time. Would benefit from skilled PT to address above deficits and promote optimal return to PLOF; recommend transition to STR upon discharge from acute hospitalization.       Follow Up Recommendations SNF    Equipment Recommendations  (TBD at rehab facility)    Recommendations for Other Services       Precautions / Restrictions Precautions Precautions: Fall Restrictions Weight Bearing Restrictions: No      Mobility  Bed Mobility Overal bed mobility: Needs Assistance Bed Mobility: Supine to Sit     Supine to sit: Total assist;Max assist     General bed mobility comments: needs assist for mobility and attempts to get to EOB. Able to initiate OOB mobility and partially sit at EOB, however pt with heavy posterior lean and then "gradually falls" back into bed. Needed +2 assist for return supine and to slide up towards HOB. Unable to further progress OOB mobility.  Transfers                 General transfer comment: unable to attempt  Ambulation/Gait                Stairs            Wheelchair Mobility    Modified Rankin (Stroke Patients Only)       Balance Overall balance assessment: Needs  assistance Sitting-balance support: Feet unsupported;Bilateral upper extremity supported Sitting balance-Leahy Scale: Zero                                       Pertinent Vitals/Pain Pain Assessment: Faces Faces Pain Scale: Hurts a little bit Pain Location: reports pain in neck and abdomen, however then becomes painful with all movement when OOB mobility is attempted Pain Descriptors / Indicators: Discomfort Pain Intervention(s): Limited activity within patient's tolerance;Repositioned    Home Living Family/patient expects to be discharged to:: Private residence Living Arrangements: Children(daughter) Available Help at Discharge: Family Type of Home: House Home Access: Stairs to enter   Technical brewer of Steps: unsure as pt is poor historian   Home Equipment: Environmental consultant - 4 wheels Additional Comments: however recently has been at SNF receiving PT    Prior Function Level of Independence: Needs assistance   Gait / Transfers Assistance Needed: reports he has been ambulatory at baseline with rollater, however unsure of history as pt is poor historian.           Hand Dominance        Extremity/Trunk Assessment   Upper Extremity Assessment Upper Extremity Assessment: Generalized weakness(B UE grossly 3+/5)    Lower Extremity Assessment Lower Extremity Assessment: Generalized weakness(B LE grossly 2+/5)       Communication   Communication: No  difficulties  Cognition Arousal/Alertness: Awake/alert Behavior During Therapy: WFL for tasks assessed/performed Overall Cognitive Status: Difficult to assess                                 General Comments: able to state name and place, however confused on DOB and situation      General Comments      Exercises Other Exercises Other Exercises: supine ther-ex performed on B LE including ankle pumps, SLRs, and hip abd/add. All ther-ex performed x 10 reps with mod/max assist and cues for  correct technique.   Assessment/Plan    PT Assessment Patient needs continued PT services  PT Problem List Decreased strength;Decreased activity tolerance;Decreased balance;Decreased mobility;Decreased cognition       PT Treatment Interventions Gait training;Therapeutic exercise;Balance training    PT Goals (Current goals can be found in the Care Plan section)  Acute Rehab PT Goals Patient Stated Goal: unable to state PT Goal Formulation: Patient unable to participate in goal setting Time For Goal Achievement: 02/15/18 Potential to Achieve Goals: Fair    Frequency Min 2X/week   Barriers to discharge        Co-evaluation               AM-PAC PT "6 Clicks" Daily Activity  Outcome Measure Difficulty turning over in bed (including adjusting bedclothes, sheets and blankets)?: Unable Difficulty moving from lying on back to sitting on the side of the bed? : Unable Difficulty sitting down on and standing up from a chair with arms (e.g., wheelchair, bedside commode, etc,.)?: Unable Help needed moving to and from a bed to chair (including a wheelchair)?: Total Help needed walking in hospital room?: Total Help needed climbing 3-5 steps with a railing? : Total 6 Click Score: 6    End of Session   Activity Tolerance: Patient limited by pain(limited due to cognition) Patient left: in bed;with bed alarm set;with SCD's reapplied(notified RN that pt soiled in bed) Nurse Communication: Mobility status PT Visit Diagnosis: Muscle weakness (generalized) (M62.81);Difficulty in walking, not elsewhere classified (R26.2);Unsteadiness on feet (R26.81)    Time: 1310-1340 PT Time Calculation (min) (ACUTE ONLY): 30 min   Charges:   PT Evaluation $PT Eval Low Complexity: 1 Low PT Treatments $Therapeutic Exercise: 8-22 mins        Adam Rios, PT, DPT [phone]   Adam Rios 02/01/2018, 1:59 PM

## 2018-02-02 LAB — GLUCOSE, CAPILLARY
GLUCOSE-CAPILLARY: 136 mg/dL — AB (ref 70–99)
Glucose-Capillary: 102 mg/dL — ABNORMAL HIGH (ref 70–99)

## 2018-02-02 MED ORDER — SULFAMETHOXAZOLE-TRIMETHOPRIM 800-160 MG PO TABS: 1.0000 | ORAL_TABLET | Freq: Two times a day (BID) | ORAL | 0 refills | Status: DC

## 2018-02-02 NOTE — Clinical Social Work Note (Addendum)
CSW still waiting for insurance authorization from SNF.  CSW continuing to follow patient's progress throughout discharge planning.  CSW updated bedside nurse, and patient's daughter Stephaun Million, [phone].  Jones Broom. Tracy, MSW, [place]  02/02/2018 2:46 PM

## 2018-02-02 NOTE — Care Management Important Message (Signed)
Important Message  Patient Details  Name: Adam Rios MRN: [id number] Date of Birth: Jun 12, 1940   Medicare Important Message Given:  Other (see comment)  A notice was not signed on admission.  Patient is not able to comprehend and sign notice.  Contacted patient's daughter [name] and explained notice, had attending to call [name] to discuss discharge.  One notice placed with patient belonging and the other to HIM to scan into record. [name] will not be coming to the unit today.   Katrina Stack, RN 02/02/2018, 11:00 AM

## 2018-02-03 LAB — BASIC METABOLIC PANEL
Anion gap: 7 (ref 5–15)
BUN: 16 mg/dL (ref 8–23)
CO2: 20 mmol/L — AB (ref 22–32)
CREATININE: 0.9 mg/dL (ref 0.61–1.24)
Calcium: 8.4 mg/dL — ABNORMAL LOW (ref 8.9–10.3)
Chloride: 107 mmol/L (ref 98–111)
GFR calc Af Amer: >60 mL/min (ref >60)
GFR calc non Af Amer: >60 mL/min (ref >60)
GLUCOSE: 115 mg/dL — AB (ref 70–99)
Potassium: 3.8 mmol/L (ref 3.5–5.1)
Sodium: 134 mmol/L — ABNORMAL LOW (ref 135–145)

## 2018-02-03 LAB — CULTURE, BLOOD (ROUTINE X 2)
Culture: NO GROWTH
Culture: NO GROWTH

## 2018-02-03 LAB — C DIFFICILE QUICK SCREEN W PCR REFLEX
C Diff antigen: POSITIVE — AB
C Diff interpretation: DETECTED
C Diff toxin: POSITIVE — AB

## 2018-02-03 LAB — GLUCOSE, CAPILLARY: GLUCOSE-CAPILLARY: 104 mg/dL — AB (ref 70–99)

## 2018-02-03 MED ORDER — VANCOMYCIN 50 MG/ML ORAL SOLUTION
125.0000 mg | Freq: Four times a day (QID) | ORAL | Status: DC
  Administered 2018-02-03 – 2018-02-04 (×6): 125 mg via ORAL
  Filled 2018-02-03 (×8): qty 2.5

## 2018-02-03 NOTE — Progress Notes (Signed)
[place] [facility] [place] NAME: Adam Rios    MR#:  [id number]  DATE OF BIRTH:  07-08-1940  SUBJECTIVE:  Has had multiple watery bowel movements starting yesterday evening. Endorses some crampy LLQ abdominal pain. No nausea or vomiting.  REVIEW OF SYSTEMS:    ROS  CONSTITUTIONAL: No fever. +weakness. No weight gain, no weight loss.  EYES: No blurry or double vision.  ENT: No tinnitus. No postnasal drip. No redness of the oropharynx.  RESPIRATORY: No cough, no wheeze, no hemoptysis. No dyspnea.  CARDIOVASCULAR: No chest pain. No orthopnea. No palpitations. No syncope.  GASTROINTESTINAL: No nausea, no vomiting. +diarrhea. +LLQ abdominal pain. No melena or hematochezia.  GENITOURINARY: No dysuria or hematuria.  ENDOCRINE: No polyuria or nocturia. No heat or cold intolerance.  HEMATOLOGY: No anemia. No bruising. No bleeding.  INTEGUMENTARY: No rashes. No lesions.  MUSCULOSKELETAL: No arthritis. No swelling. No gout.  NEUROLOGIC: No numbness, tingling, or ataxia. No seizure-type activity.  PSYCHIATRIC: No anxiety. No insomnia. No ADD.   DRUG ALLERGIES:   Allergies  Allergen Reactions  . Fentanyl Nausea Only    VITALS:  Blood pressure 120/87, pulse 97, temperature 98.9 F (37.2 C), temperature source Oral, resp. rate 18, height 6' (1.829 m), weight 95.3 kg, SpO2 94 %.  PHYSICAL EXAMINATION:   Physical Exam  GENERAL:  77 y.o.-year-old patient laying in bed, in no acute distress.  EYES: Pupils equal, round, reactive to light and accommodation. No scleral icterus. Extraocular muscles intact.  HEENT: Head atraumatic, normocephalic. Oropharynx and nasopharynx clear.  NECK:  Supple, no jugular venous distention. No thyroid enlargement, no tenderness.  LUNGS: lungs CTAB. No use of accessory muscles of respiration.  CARDIOVASCULAR: S1, S2 normal. No murmurs, rubs, or gallops.  ABDOMEN: Soft, nondistended. +mild tenderness to palpation of the  LLQ. Bowel sounds present. No organomegaly or mass.  EXTREMITIES: No cyanosis, clubbing or edema b/l.    NEUROLOGIC: Awake, alert and responds to verbal commands Moves extremities PSYCHIATRIC: mood pleasant, oriented x 3. SKIN: No obvious rash, lesion, or ulcer.   LABORATORY PANEL:   CBC Recent Labs  Lab 02/01/18 0404  WBC 11.0*  HGB 13.7  HCT 39.2*  PLT 234   ------------------------------------------------------------------------------------------------------------------ Chemistries  Recent Labs  Lab 01/29/18 2243  02/03/18 0845  NA 132*   < > 134*  K 4.2   < > 3.8  CL 102   < > 107  CO2 21*   < > 20*  GLUCOSE 109*   < > 115*  BUN 29*   < > 16  CREATININE 1.03   < > 0.90  CALCIUM 8.9   < > 8.4*  AST 18  --   --   ALT 22  --   --   ALKPHOS 55  --   --   BILITOT 1.1  --   --    < > = values in this interval not displayed.   ------------------------------------------------------------------------------------------------------------------  Cardiac Enzymes Recent Labs  Lab 01/29/18 2243  TROPONINI 0.03*   ------------------------------------------------------------------------------------------------------------------  RADIOLOGY:  No results found.   ASSESSMENT AND PLAN:  77 year old male patient with history of recent hemorrhagic stroke, hypertension, neurogenic bladder, renal cell cancer, hypertension currently under hospitalist service for encephalopathy, sepsis and UTI  C. Diff colitis- had multiple watery BMs starting last night. C. Diff positive. Has been on antibiotics for UTI. - start vancomycin 125mg  po qid x 10 days  Acute metabolic encephalopathysecondary to UTI- resolved. Patient alert and  oriented x 3 this morning. - holding Baclofen and Zanaflex   Citrobacter UTI- no signs of sepsis - urine cultures grew >100,000 CFU of Citrobacter - blood cultures were negative - switch from cefepime to septra bid for a total of 7 days  Neurogenic  bladder- intermittent self-catheterization at home - foley in place - needs f/u with urologist on discharge  Subacute intracranial bleed- patient recently hospitalized for hemorrhagic stroke - CT head showed 3cc right occipital lobe subacute hematoma - discussed with patient's neurologist (Dr. Dahlia Client) who felt that it is unlikely that patient's AMS is related to intracranial process at this time. Did not recommend any new imaging studies. - hold anticoagulation and NSAIDs  Dysphagia - SLP consulted- recommend dysphagia III diet with well-chopped meats and thin liquids  All the records are reviewed and case discussed with Care Management/Social Worker. Management plans discussed with the patient, family and they are in agreement.  CODE STATUS: DNR  DVT Prophylaxis: SCDs due to recent hemorrhagic stroke  TOTAL TIME TAKING CARE OF THIS PATIENT: 40 minutes.   POSSIBLE D/C to SNF tomorrow if diarrhea improves.  Berna Spare Rhina Kramme M.D on 02/03/2018 at 12:31 PM  Between 7am to 6pm - Pager - [phone]  After 6pm go to [email or web address] - password [email or web address] [facility]  Office  [phone]  CC: Primary care physician; Bary Richard, NP  Note: This dictation was prepared with Dragon dictation along with smaller phrase technology. Any transcriptional errors that result from this process are unintentional.

## 2018-02-03 NOTE — Clinical Social Work Note (Addendum)
CSW received phone call from [facility] that they have received insurance authorization.  [facility] can accept patient once he is medically ready for discharge and orders have been received.  CSW to facilitate discharge planning.  3:55pm  CSW updated patient's daughter [name] that insurance has approved patient to go to [facility], however the physician, wants to keep patient one more day due to testing positive for CDIFF, CSW updated [facility] [place].  Jones Broom. Cory Rama, MSW, [place]  02/03/2018 9:25 AM

## 2018-02-03 NOTE — Progress Notes (Signed)
CRITICAL VALUE ALERT  Critical Value:  Cdiff PCR Positive  Date & Time Notied:  02/03/18 1010  Provider Notified: MD Mayo made aware and at bedside  Orders Received/Actions taken: New orders received. Will carry out and continue to monitor.

## 2018-02-03 NOTE — Progress Notes (Signed)
Patient has had loose BM x 2 over night per report from night shift RN, had the 3rd this am. MD [facility] made aware of loose, brown, and mucous BM. Patient also reports abdominal tenderness.  Cdiff per PCR ordered, Specimen collected and walked to lab. Patient placed on enteric precautions, staff and patient provided education. Will continue to monitor.

## 2018-02-03 NOTE — Care Management (Signed)
Informed by CSW-discharge to be delayed 24 hours due to new C Diff. Being treated with oral Vancomycin.

## 2018-02-04 LAB — BASIC METABOLIC PANEL
ANION GAP: 7 (ref 5–15)
BUN: 14 mg/dL (ref 8–23)
CO2: 22 mmol/L (ref 22–32)
Calcium: 8.5 mg/dL — ABNORMAL LOW (ref 8.9–10.3)
Chloride: 106 mmol/L (ref 98–111)
Creatinine, Ser: 0.85 mg/dL (ref 0.61–1.24)
GFR calc Af Amer: >60 mL/min (ref >60)
Glucose, Bld: 102 mg/dL — ABNORMAL HIGH (ref 70–99)
POTASSIUM: 3.3 mmol/L — AB (ref 3.5–5.1)
SODIUM: 135 mmol/L (ref 135–145)

## 2018-02-04 LAB — GLUCOSE, CAPILLARY: GLUCOSE-CAPILLARY: 103 mg/dL — AB (ref 70–99)

## 2018-02-04 MED ORDER — POTASSIUM CHLORIDE CRYS ER 20 MEQ PO TBCR
40.0000 meq | EXTENDED_RELEASE_TABLET | Freq: Two times a day (BID) | ORAL | Status: DC
  Administered 2018-02-04: 40 meq via ORAL
  Filled 2018-02-04: qty 2

## 2018-02-04 MED ORDER — VANCOMYCIN 50 MG/ML ORAL SOLUTION: 125.0000 mg | Freq: Four times a day (QID) | ORAL | 0 refills | Status: AC

## 2018-02-04 NOTE — Progress Notes (Addendum)
Report called and give  to nurse at [facility]. EMS called, and patient packet ready

## 2018-02-04 NOTE — Clinical Social Work Placement (Signed)
   CLINICAL SOCIAL WORK PLACEMENT  NOTE  Date:  02/04/2018  Patient Details  Name: Adam Rios MRN: [id number] Date of Birth: 1940/10/28  Clinical Social Work is seeking post-discharge placement for this patient at the [place] level of care (*CSW will initial, date and re-position this form in  chart as items are completed):  Yes   Patient/family provided with [place] Work Department's list of facilities offering this level of care within the geographic area requested by the patient (or if unable, by the patient's family).  Yes   Patient/family informed of their freedom to choose among providers that offer the needed level of care, that participate in Medicare, Medicaid or managed care program needed by the patient, have an available bed and are willing to accept the patient.  Yes   Patient/family informed of 's ownership interest in [facility] and [facility], as well as of the fact that they are under no obligation to receive care at these facilities.  PASRR submitted to EDS on 02/01/18     PASRR number received on       Existing PASRR number confirmed on 02/01/18     FL2 transmitted to all facilities in geographic area requested by pt/family on 02/01/18     FL2 transmitted to all facilities within larger geographic area on       Patient informed that his/her managed care company has contracts with or will negotiate with certain facilities, including the following:        Yes   Patient/family informed of bed offers received.  Patient chooses bed at [facility]     Physician recommends and patient chooses bed at      Patient to be transferred to [facility] on 02/04/18.  Patient to be transferred to facility by [facility]     Patient family notified on 02/04/18 of transfer.  Name of family member notified:  [place] [phone]     PHYSICIAN Please sign DNR, Please sign FL2,  Please prepare prescriptions     Additional Comment:    _______________________________________________ Ross Ludwig, LCSWA 02/04/2018, 12:09 PM

## 2018-02-04 NOTE — Discharge Summary (Addendum)
[place] [facility] [place] NAME: Adam Rios    MR#:  [id number]  DATE OF BIRTH:  25-Jul-1940  DATE OF ADMISSION:  01/29/2018   ADMITTING PHYSICIAN: Amelia Jo, MD  DATE OF DISCHARGE: 02/04/18  PRIMARY CARE PHYSICIAN: Bary Richard, NP   ADMISSION DIAGNOSIS:  Altered mental status, unspecified altered mental status type [R41.82] Urinary tract infection associated with catheterization of urinary tract, unspecified indwelling urinary catheter type, initial encounter ([place]) [T83.511A, N39.0] DISCHARGE DIAGNOSIS:  Active Problems:   Acute encephalopathy  SECONDARY DIAGNOSIS:   Past Medical History:  Diagnosis Date  . Cognitive impairment   . Hemorrhagic cerebrovascular accident (CVA) ([place])   . Hypertension   . Neurogenic bladder   . Renal cell carcinoma ([place])   . Renal disorder   . Stroke [facility])    HOSPITAL COURSE:   Adam Rios is a 77 year old male with a PMH of cognitive impairment, HTN, neurogenic bladder (intermittent self-catheterization), recent hemorrhagic stroke who presented to the ED with AMS. In the ED, WBC count was elevated to 21.5 and UA was consistent with UTI. CT head was performed due to recent hx of hemorrhagic stroke and showed 3cc right occipital lobe subacute hematoma. He was admitted for further management.  Citrobacter UTI - urine cultures grew >100,000 CFU of Citrobacter - blood cultures were negative - initially treated with cefepime and transitioned to septra bid for a total 5 day course per urine culture susceptibilities  C diff colitis- improved - developed diarrhea on 9/4 and tested positive for C diff on 9/5 - started on vancomycin oral solution and BMs greatly decreased - continue vancomycin qid for a total 10 day course  Acute metabolic encephalopathysecondary to UTI- resolved on the day of discharge. Patient alert and oriented x 3. - Baclofen and Zanaflex held during admission and on discharge due to  AMS, but can be restarted as an outpatient.  Neurogenic bladder- does intermittent self-catheterization at home - foley placed during hospitalization and removed on discharge - patient to resume intermittent self-catheterization at home - needs f/u with urologist on discharge  Subacute intracranial bleed- patient recently hospitalized for hemorrhagic stroke - CT head showed 3cc right occipital lobe subacute hematoma - discussed with patient's neurologist (Dr. Dahlia Client) who felt that it is unlikely that patient's AMS is related to intracranial process. Did not recommend any new imaging studies. - not treated with any anticoagulation during hospitalization - aspirin 81 held on discharge  Dysphagia - seen by SLP this admission, recommended the following: Dysphagia level 3 w/ well-chopped meats, thin liquids - NO Straws. Aspiration precautions. Feeding support and monitoring at all meals d/t Cognitive decline/status at baseline - needs assistance with feeding  DISCHARGE CONDITIONS:  Citrobacter UTI- resolved C diff colitis Neurogenic bladder Subacute intracranial bleed Dysphagia CONSULTS OBTAINED:  Treatment Team:  Leotis Pain, MD Festus Aloe, MD DRUG ALLERGIES:   Allergies  Allergen Reactions  . Fentanyl Nausea Only   DISCHARGE MEDICATIONS:   Allergies as of 02/04/2018      Reactions   Fentanyl Nausea Only      Medication List    STOP taking these medications   aspirin EC 81 MG tablet   baclofen 10 MG tablet Commonly known as:  LIORESAL   docusate sodium 100 MG capsule Commonly known as:  COLACE   POLYETHYLENE GLYCOL 3350 PO   senna 8.6 MG tablet Commonly known as:  SENOKOT   tizanidine 2 MG capsule Commonly known as:  ZANAFLEX  TAKE these medications   acetaminophen 325 MG tablet Commonly known as:  TYLENOL Take 650 mg by mouth.   amLODipine 10 MG tablet Commonly known as:  NORVASC Take 10 mg by mouth daily.   carboxymethylcellulose  0.5 % Soln Commonly known as:  REFRESH PLUS Apply 1 drop to eye 4 (four) times daily as needed.   guaiFENesin 100 MG/5ML Soln Commonly known as:  ROBITUSSIN Take 5 mLs every 4 (four) hours as needed by mouth for cough or to loosen phlegm.   losartan 25 MG tablet Commonly known as:  COZAAR Take 50 mg by mouth daily.   oxybutynin 5 MG 24 hr tablet Commonly known as:  DITROPAN-XL Take 5 mg by mouth daily.   pantoprazole 40 MG tablet Commonly known as:  PROTONIX Take 40 mg daily by mouth.   vancomycin 50 mg/mL  oral solution Commonly known as:  VANCOCIN Take 2.5 mLs (125 mg total) by mouth 4 (four) times daily for 9 days.        DISCHARGE INSTRUCTIONS:  1. F/u with PCP in 1-2 weeks 2. F/u with urologist in 1-2 weeks 3. Continue vancomycin 4 times daily for 9 more days (total 10 day course) 4. Aspirin 81mg  discontinued due to recent hemorrhagic stroke 5. zanaflex and baclofen held on discharge due to AMS, can restart as outpatient 6. Urine culture grew citrobacter- patient discharged on septra for 4 additional days (total 7 day course of antibiotics) 7. Can resume intermittent catheterization on discharge 8. Needs assistance with feeding at SNF 9. Stopped stool softeners due to diarrhea- can restart as needed DIET:  Dysphagia level 3 w/ well-chopped meats, thin liquids - NO Straws. Aspiration precautions. Feeding support and monitoring at all meals d/t Cognitive decline/status at baseline DISCHARGE CONDITION:  Stable ACTIVITY:  Activity as tolerated OXYGEN:  Home Oxygen: No  Oxygen Delivery: room air DISCHARGE LOCATION:  nursing home   If you experience worsening of your admission symptoms, develop shortness of breath, life threatening emergency, suicidal or homicidal thoughts you must seek medical attention immediately by calling 911 or calling your MD immediately  if symptoms less severe.  You Must read complete instructions/literature along with all the possible  adverse reactions/side effects for all the Medicines you take and that have been prescribed to you. Take any new Medicines after you have completely understood and accpet all the possible adverse reactions/side effects.   Please note  You were cared for by a hospitalist during your hospital stay. If you have any questions about your discharge medications or the care you received while you were in the hospital after you are discharged, you can call the unit and asked to speak with the hospitalist on call if the hospitalist that took care of you is not available. Once you are discharged, your primary care physician will handle any further medical issues. Please note that NO REFILLS for any discharge medications will be authorized once you are discharged, as it is imperative that you return to your primary care physician (or establish a relationship with a primary care physician if you do not have one) for your aftercare needs so that they can reassess your need for medications and monitor your lab values.    On the day of Discharge:  VITAL SIGNS:  Blood pressure 107/71, pulse 81, temperature 98.3 F (36.8 C), temperature source Oral, resp. rate 18, height 6' (1.829 m), weight 91.3 kg, SpO2 93 %. PHYSICAL EXAMINATION:  GENERAL:  77 y.o.-year-old patient lying in the bed  with no acute distress.  EYES: Pupils equal, round, reactive to light and accommodation. No scleral icterus. Extraocular muscles intact.  HEENT: Head atraumatic, normocephalic. Oropharynx and nasopharynx clear.  NECK:  Supple, no jugular venous distention. No thyroid enlargement, no tenderness.  LUNGS: Normal breath sounds bilaterally, no wheezing, rales,rhonchi or crepitation. No use of accessory muscles of respiration.  CARDIOVASCULAR: S1, S2 normal. No murmurs, rubs, or gallops.  ABDOMEN: Soft, +mild generalized tenderness to palpation, non-distended. Bowel sounds present. No rebound or guarding. No organomegaly or mass.    EXTREMITIES: No pedal edema, cyanosis, or clubbing.  NEUROLOGIC: Cranial nerves II through XII are intact. Muscle strength 5/5 in all extremities. Sensation intact. Gait not checked.  PSYCHIATRIC: The patient is alert and oriented x 3.  SKIN: No obvious rash, lesion, or ulcer.  DATA REVIEW:   CBC Recent Labs  Lab 02/01/18 0404  WBC 11.0*  HGB 13.7  HCT 39.2*  PLT 234    Chemistries  Recent Labs  Lab 01/29/18 2243  02/04/18 0313  NA 132*   < > 135  K 4.2   < > 3.3*  CL 102   < > 106  CO2 21*   < > 22  GLUCOSE 109*   < > 102*  BUN 29*   < > 14  CREATININE 1.03   < > 0.85  CALCIUM 8.9   < > 8.5*  AST 18  --   --   ALT 22  --   --   ALKPHOS 55  --   --   BILITOT 1.1  --   --    < > = values in this interval not displayed.     Microbiology Results  Results for orders placed or performed during the hospital encounter of 01/29/18  Urine culture     Status: Abnormal   Collection Time: 01/29/18 10:55 PM  Result Value Ref Range Status   Specimen Description   Final    URINE, RANDOM Performed at [facility], [address], [place], [place] [address]    Special Requests   Final    NONE Performed at [facility], [address] [place], [place] [address]    Culture >=100,000 COLONIES/mL CITROBACTER FREUNDII (A)  Final   Report Status 02/01/2018 FINAL  Final   Organism ID, Bacteria CITROBACTER FREUNDII (A)  Final      Susceptibility   Citrobacter freundii - MIC*    CEFAZOLIN >=64 RESISTANT Resistant     CEFTRIAXONE <=1 SENSITIVE Sensitive     CIPROFLOXACIN <=0.25 SENSITIVE Sensitive     GENTAMICIN <=1 SENSITIVE Sensitive     IMIPENEM 0.5 SENSITIVE Sensitive     NITROFURANTOIN <=16 SENSITIVE Sensitive     TRIMETH/SULFA <=20 SENSITIVE Sensitive     PIP/TAZO <=4 SENSITIVE Sensitive     * >=100,000 COLONIES/mL CITROBACTER FREUNDII  Culture, blood (Routine x 2)     Status: None   Collection Time: 01/29/18 10:56 PM  Result Value Ref Range Status    Specimen Description BLOOD RIGHT FOREARM  Final   Special Requests   Final    BOTTLES DRAWN AEROBIC AND ANAEROBIC Blood Culture results may not be optimal due to an excessive volume of blood received in culture bottles   Culture   Final    NO GROWTH 5 DAYS Performed at [facility], [address] [place],  [address]    Report Status 02/03/2018 FINAL  Final  Culture, blood (Routine x 2)  Status: None   Collection Time: 01/29/18 10:56 PM  Result Value Ref Range Status   Specimen Description BLOOD LEFT FOREARM  Final   Special Requests   Final    BOTTLES DRAWN AEROBIC AND ANAEROBIC Blood Culture results may not be optimal due to an excessive volume of blood received in culture bottles   Culture   Final    NO GROWTH 5 DAYS Performed at [facility], [address] [place], [place] [address]    Report Status 02/03/2018 FINAL  Final  MRSA PCR Screening     Status: Abnormal   Collection Time: 01/30/18  4:21 AM  Result Value Ref Range Status   MRSA by PCR POSITIVE (A) NEGATIVE Final    Comment:        The GeneXpert MRSA Assay (FDA approved for NASAL specimens only), is one component of a comprehensive MRSA colonization surveillance program. It is not intended to diagnose MRSA infection nor to guide or monitor treatment for MRSA infections. RESULT CALLED TO, READ BACK BY AND VERIFIED WITH: CARA CAMPBELL AT 0626 ON 01/30/18 BY SNJ Performed at [facility], [place], [place] [address]   C difficile quick scan w PCR reflex     Status: Abnormal   Collection Time: 02/03/18  8:43 AM  Result Value Ref Range Status   C Diff antigen POSITIVE (A) NEGATIVE Final   C Diff toxin POSITIVE (A) NEGATIVE Final   C Diff interpretation Toxin producing C. difficile detected.  Final    Comment: CRITICAL RESULT CALLED TO, READ BACK BY AND VERIFIED WITH: LACY HITT AT 0953 ON 02/03/18 [place]. Performed at [facility], [address]  [place], [place] [address]     RADIOLOGY:  No results found.   Management plans discussed with the patient, family and they are in agreement.  CODE STATUS: DNR   TOTAL TIME TAKING CARE OF THIS PATIENT: 35 minutes.    Berna Spare Lyndel Dancel M.D on 02/04/2018 at 12:29 PM  Between 7am to 6pm - Pager - [phone]  After 6pm go to [email or web address] - Proofreader  [facility]  Office  [phone]  CC: Primary care physician; Bary Richard, NP   Note: This dictation was prepared with Dragon dictation along with smaller phrase technology. Any transcriptional errors that result from this process are unintentional.

## 2018-02-04 NOTE — Discharge Instructions (Signed)
It was a pleasure meeting you while you were here!  You came into the hospital because you were having confusion. We found that you had a urinary tract infection. We treated you with antibiotics and you got much better.  You then developed an infection in your colon. We put you on an antibiotic called Vancomycin. You should take 2.56ml 4 times per day for 9 more days.  Please make sure you follow-up with your primary care doctor and your urologist in the next 1-2 weeks.  -Dr. Brett Albino

## 2018-02-04 NOTE — Clinical Social Work Note (Signed)
Patient to be d/c'ed today to [facility] room 505.  Patient and family agreeable to plans will transport via ems RN to call report to [place] at [phone].  CSW updated patient's daughter [name] [phone] who is aware that patient will be discharging today.  Evette Cristal, MSW, [place]

## 2018-02-04 NOTE — Progress Notes (Signed)
As she requested, RN called patient's daughter [name] and let her know the [facility] was moved to [facility].  Phillis Knack, RN

## 2018-02-10 ENCOUNTER — Encounter: Payer: Self-pay | Admitting: Urology

## 2018-02-10 ENCOUNTER — Ambulatory Visit: Payer: Medicare HMO | Admitting: Urology

## 2018-03-04 ENCOUNTER — Ambulatory Visit: Payer: Medicare HMO | Admitting: Urology

## 2018-03-04 DIAGNOSIS — R54 Age-related physical debility: Secondary | ICD-10-CM | POA: Diagnosis present

## 2020-07-15 ENCOUNTER — Encounter: Payer: Self-pay | Admitting: Ophthalmology

## 2020-07-15 ENCOUNTER — Other Ambulatory Visit: Payer: Self-pay

## 2020-07-17 NOTE — Discharge Instructions (Signed)

## 2020-07-18 ENCOUNTER — Other Ambulatory Visit: Payer: Self-pay

## 2020-07-18 ENCOUNTER — Other Ambulatory Visit
Admission: RE | Admit: 2020-07-18 | Discharge: 2020-07-18 | Disposition: A | Discharge status: Home / Self Care | Payer: Medicare (Managed Care) | Source: Ambulatory Visit | Attending: Ophthalmology | Admitting: Ophthalmology

## 2020-07-18 DIAGNOSIS — Z20822 Contact with and (suspected) exposure to covid-19: Secondary | ICD-10-CM | POA: Insufficient documentation

## 2020-07-18 DIAGNOSIS — Z01812 Encounter for preprocedural laboratory examination: Secondary | ICD-10-CM | POA: Insufficient documentation

## 2020-07-18 LAB — SARS CORONAVIRUS 2 (TAT 6-24 HRS): SARS Coronavirus 2: NEGATIVE

## 2020-07-22 ENCOUNTER — Encounter: Payer: Self-pay | Admitting: Ophthalmology

## 2020-07-22 ENCOUNTER — Ambulatory Visit
Admission: RE | Admit: 2020-07-22 | Discharge: 2020-07-22 | Disposition: A | Discharge status: Home / Self Care | Payer: Medicare (Managed Care) | Attending: Ophthalmology | Admitting: Ophthalmology

## 2020-07-22 ENCOUNTER — Ambulatory Visit: Payer: Medicare (Managed Care) | Admitting: Anesthesiology

## 2020-07-22 ENCOUNTER — Encounter
Admission: RE | Disposition: A | Discharge status: Home / Self Care | Payer: Self-pay | Source: Home / Self Care | Attending: Ophthalmology

## 2020-07-22 ENCOUNTER — Other Ambulatory Visit: Payer: Self-pay

## 2020-07-22 DIAGNOSIS — Z79899 Other long term (current) drug therapy: Secondary | ICD-10-CM | POA: Diagnosis not present

## 2020-07-22 DIAGNOSIS — Z7902 Long term (current) use of antithrombotics/antiplatelets: Secondary | ICD-10-CM | POA: Insufficient documentation

## 2020-07-22 DIAGNOSIS — H2511 Age-related nuclear cataract, right eye: Secondary | ICD-10-CM | POA: Diagnosis present

## 2020-07-22 DIAGNOSIS — Z885 Allergy status to narcotic agent status: Secondary | ICD-10-CM | POA: Diagnosis not present

## 2020-07-22 HISTORY — PX: CATARACT EXTRACTION W/PHACO: SHX586

## 2020-07-22 HISTORY — DX: Other complications of anesthesia, initial encounter: T88.59XA

## 2020-07-22 SURGERY — PHACOEMULSIFICATION, CATARACT, WITH IOL INSERTION
Anesthesia: Monitor Anesthesia Care | Site: Eye | Laterality: Right

## 2020-07-22 MED ORDER — FENTANYL CITRATE (PF) 100 MCG/2ML IJ SOLN
INTRAMUSCULAR | Status: DC | PRN
  Administered 2020-07-22: 50 ug via INTRAVENOUS

## 2020-07-22 MED ORDER — MIDAZOLAM HCL 2 MG/2ML IJ SOLN
INTRAMUSCULAR | Status: DC | PRN
  Administered 2020-07-22: 1 mg via INTRAVENOUS

## 2020-07-22 MED ORDER — LIDOCAINE HCL (PF) 2 % IJ SOLN
INTRAOCULAR | Status: DC | PRN
  Administered 2020-07-22: 1 mL via INTRAOCULAR

## 2020-07-22 MED ORDER — MOXIFLOXACIN HCL 0.5 % OP SOLN
OPHTHALMIC | Status: DC | PRN
  Administered 2020-07-22: 0.2 mL via OPHTHALMIC

## 2020-07-22 MED ORDER — ONDANSETRON HCL 4 MG/2ML IJ SOLN
INTRAMUSCULAR | Status: DC | PRN
  Administered 2020-07-22: 4 mg via INTRAVENOUS

## 2020-07-22 MED ORDER — ACETAMINOPHEN 325 MG PO TABS: 325.0000 mg | ORAL_TABLET | Freq: Once | ORAL | Status: DC

## 2020-07-22 MED ORDER — ACETAMINOPHEN 160 MG/5ML PO SOLN: 325.0000 mg | Freq: Once | ORAL | Status: DC

## 2020-07-22 MED ORDER — ARMC OPHTHALMIC DILATING DROPS
1.0000 "application " | OPHTHALMIC | Status: DC | PRN
  Administered 2020-07-22 (×3): 1 via OPHTHALMIC

## 2020-07-22 MED ORDER — EPINEPHRINE PF 1 MG/ML IJ SOLN
INTRAOCULAR | Status: DC | PRN
  Administered 2020-07-22: 77 mL via OPHTHALMIC

## 2020-07-22 MED ORDER — LACTATED RINGERS IV SOLN: INTRAVENOUS | Status: DC

## 2020-07-22 MED ORDER — SODIUM HYALURONATE 23 MG/ML IO SOLN
INTRAOCULAR | Status: DC | PRN
  Administered 2020-07-22: 0.6 mL via INTRAOCULAR

## 2020-07-22 MED ORDER — SODIUM HYALURONATE 10 MG/ML IO SOLN
INTRAOCULAR | Status: DC | PRN
  Administered 2020-07-22: 0.55 mL via INTRAOCULAR

## 2020-07-22 MED ORDER — TETRACAINE HCL 0.5 % OP SOLN
1.0000 [drp] | OPHTHALMIC | Status: DC | PRN
  Administered 2020-07-22 (×3): 1 [drp] via OPHTHALMIC

## 2020-07-22 SURGICAL SUPPLY — 19 items

## 2020-07-22 NOTE — Anesthesia Procedure Notes (Signed)
Procedure Name: MAC Date/Time: 07/22/2020 9:07 AM Performed by: Cameron Ali, CRNA Pre-anesthesia Checklist: Patient identified, Emergency Drugs available, Suction available, Timeout performed and Patient being monitored Patient Re-evaluated:Patient Re-evaluated prior to induction Oxygen Delivery Method: Nasal cannula Placement Confirmation: positive ETCO2

## 2020-07-22 NOTE — Anesthesia Postprocedure Evaluation (Signed)
Anesthesia Post Note  Patient: Adam Rios.  Procedure(s) Performed: CATARACT EXTRACTION PHACO AND INTRAOCULAR LENS PLACEMENT (IOC) RIGHT (Right Eye)     Patient location during evaluation: PACU Anesthesia Type: MAC Level of consciousness: awake and alert and oriented Pain management: satisfactory to patient Vital Signs Assessment: post-procedure vital signs reviewed and stable Respiratory status: spontaneous breathing, nonlabored ventilation and respiratory function stable Cardiovascular status: blood pressure returned to baseline and stable Postop Assessment: Adequate PO intake and No signs of nausea or vomiting Anesthetic complications: no   No complications documented.  Raliegh Ip

## 2020-07-22 NOTE — Anesthesia Preprocedure Evaluation (Signed)
Anesthesia Evaluation  Patient identified by MRN, date of birth, ID band Patient awake    Reviewed: Allergy & Precautions, H&P , NPO status , Patient's Chart, lab work & pertinent test results  Airway Mallampati: II  TM Distance: >3 FB Neck ROM: full    Dental no notable dental hx.    Pulmonary former smoker,    Pulmonary exam normal breath sounds clear to auscultation       Cardiovascular hypertension, Normal cardiovascular exam Rhythm:regular Rate:Normal     Neuro/Psych CVA    GI/Hepatic GERD  ,  Endo/Other    Renal/GU Renal disease     Musculoskeletal   Abdominal   Peds  Hematology   Anesthesia Other Findings   Reproductive/Obstetrics                             Anesthesia Physical Anesthesia Plan  ASA: II  Anesthesia Plan: MAC   Post-op Pain Management:    Induction:   PONV Risk Score and Plan: 1 and Treatment may vary due to age or medical condition, Midazolam and TIVA  Airway Management Planned:   Additional Equipment:   Intra-op Plan:   Post-operative Plan:   Informed Consent: I have reviewed the patients History and Physical, chart, labs and discussed the procedure including the risks, benefits and alternatives for the proposed anesthesia with the patient or authorized representative who has indicated his/her understanding and acceptance.     Dental Advisory Given  Plan Discussed with: CRNA  Anesthesia Plan Comments:         Anesthesia Quick Evaluation

## 2020-07-22 NOTE — Op Note (Signed)
OPERATIVE NOTE  Nobel Brar [id number] 07/22/2020   PREOPERATIVE DIAGNOSIS:  Nuclear sclerotic cataract right eye.  H25.11   POSTOPERATIVE DIAGNOSIS:    Nuclear sclerotic cataract right eye.     PROCEDURE:  Phacoemusification with posterior chamber intraocular lens placement of the right eye   LENS:   Implant Name Type Inv. Item Serial No. Manufacturer Lot No. LRB No. Used Action  LENS IOL TECNIS EYHANCE 19.5 - [id number] Intraocular Lens LENS IOL TECNIS EYHANCE 19.5 [id number] JOHNSON   Right 1 Implanted       Procedure(s) with comments: CATARACT EXTRACTION PHACO AND INTRAOCULAR LENS PLACEMENT (IOC) RIGHT (Right) - 4.77 0:44.2  DIB00 +19.5   ULTRASOUND TIME: 0 minutes 44 seconds.  CDE 4.77   SURGEON:  Benay Pillow, MD, MPH  ANESTHESIOLOGIST: Anesthesiologist: Ronelle Nigh, MD CRNA: Cameron Ali, CRNA   ANESTHESIA:  Topical with tetracaine drops augmented with 1% preservative-free intracameral lidocaine.  ESTIMATED BLOOD LOSS: less than 1 mL.   COMPLICATIONS:  None.   DESCRIPTION OF PROCEDURE:  The patient was identified in the holding room and transported to the operating room and placed in the supine position under the operating microscope.  The positioning was very challenging due to back and pain issues.  A stretcher was used in [place] position.  The right eye was identified as the operative eye and it was prepped and draped in the usual sterile ophthalmic fashion.   A 1.0 millimeter clear-corneal paracentesis was made at the 10:30 position. 0.5 ml of preservative-free 1% lidocaine with epinephrine was injected into the anterior chamber.  The anterior chamber was filled with Healon 5 viscoelastic.  A 2.4 millimeter keratome was used to make a near-clear corneal incision at the 8:00 position.  A curvilinear capsulorrhexis was made with a cystotome and capsulorrhexis forceps.  Balanced salt solution was used to hydrodissect and hydrodelineate the nucleus.    Phacoemulsification was then used in stop and chop fashion to remove the lens nucleus and epinucleus.  The remaining cortex was then removed using the irrigation and aspiration handpiece. Healon was then placed into the capsular bag to distend it for lens placement.  A lens was then injected into the capsular bag.  The remaining viscoelastic was aspirated.   Wounds were hydrated with balanced salt solution.  The anterior chamber was inflated to a physiologic pressure with balanced salt solution.   Intracameral vigamox 0.1 mL undiluted was injected into the eye and a drop placed onto the ocular surface.  No wound leaks were noted.  The patient was taken to the recovery room in stable condition without complications of anesthesia or surgery  Benay Pillow 07/22/2020, 9:47 AM

## 2020-07-22 NOTE — H&P (Signed)
[facility]   Primary Care Physician:  Dola Argyle, MD Ophthalmologist: Dr. Benay Pillow  Pre-Procedure History & Physical: HPI:  Adam Rios. is a 80 y.o. male here for cataract surgery.   Past Medical History:  Diagnosis Date  . Cognitive impairment   . Complication of anesthesia    Fentanyl causes nausea  . 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 Hypertension   . Neurogenic bladder   . Renal cell carcinoma ([place]) 2005  . Renal disorder   . Stroke ([place])     01/17/18, 12/21   Some weakness and cognitive decline    Past Surgical History:  Procedure Laterality Date  . APPENDECTOMY    . HERNIA REPAIR    . LUMBAR FUSION    . NEPHRECTOMY Left 2005    Prior to Admission medications   Medication Sig Start Date End Date Taking? Authorizing Provider  acetaminophen (TYLENOL) 325 MG tablet Take 500 mg by mouth every 6 (six) hours as needed. 08/14/14  Yes [provider]  ASPIRIN 81 PO Take by mouth daily.   Yes [provider]  atorvastatin (LIPITOR) 40 MG tablet Take 40 mg by mouth daily.   Yes [provider]  donepezil (ARICEPT) 5 MG tablet Take 5 mg by mouth at bedtime.   Yes [provider]  furosemide (LASIX) 20 MG tablet Take 20 mg by mouth daily as needed.   Yes [provider]  gabapentin (NEURONTIN) 300 MG capsule Take 300 mg by mouth 2 (two) times daily.   Yes [provider]  losartan (COZAAR) 25 MG tablet Take 25 mg by mouth daily. 03/27/17 07/22/20 Yes [provider]  mirabegron ER (MYRBETRIQ) 25 MG TB24 tablet Take 25 mg by mouth daily.   Yes [provider]  nitrofurantoin (MACRODANTIN) 100 MG capsule Take 100 mg by mouth daily.   Yes [provider]  omeprazole (PRILOSEC) 20 MG capsule Take 20 mg by mouth daily.   Yes [provider]  tamsulosin (FLOMAX) 0.4 MG CAPS capsule Take 0.4 mg by mouth daily.   Yes [provider]  tiZANidine  (ZANAFLEX) 4 MG tablet Take 4 mg by mouth every 6 (six) hours as needed for muscle spasms.   Yes [provider]  vitamin B-12 (CYANOCOBALAMIN) 1000 MCG tablet Take 1,000 mcg by mouth daily.   Yes [provider]    Allergies as of 06/06/2020 - Review Complete 01/29/2018  Allergen Reaction Noted  . Fentanyl Nausea Only 07/29/2016    History reviewed. No pertinent family history.  Social History   Socioeconomic History  . Marital status: Divorced    Spouse name: Not on file  . Number of children: Not on file  . Years of education: Not on file  . Highest education level: Not on file  Occupational History  . Not on file  Tobacco Use  . Smoking status: Former Smoker    Packs/day: 0.25    Years: 10.00    Pack years: 2.50    Types: Cigarettes    Quit date: 2005    Years since quitting: 17.1  . Smokeless tobacco: Never Used  Vaping Use  . Vaping Use: Never used  Substance and Sexual Activity  . Alcohol use: Not Currently  . Drug use: Never  . Sexual activity: Not on file  Other Topics Concern  . Not on file  Social History Narrative  . Not on file   Social Determinants of Health   Financial  Resource Strain: Not on file  Food Insecurity: Not on file  Transportation Needs: Not on file  Physical Activity: Not on file  Stress: Not on file  Social Connections: Not on file  Intimate Partner Violence: Not on file    Review of Systems: See HPI, otherwise negative ROS  Physical Exam: BP (!) 161/84   Pulse 87   Temp 98 F (36.7 C) (Temporal)   Ht 6\' 1"  (1.854 m)   Wt 101.8 kg   SpO2 98%   BMI 29.62 kg/m  General:   Alert,  pleasant and cooperative in NAD Head:  Normocephalic and atraumatic. Respiratory:  Normal work of breathing.  Impression/Plan: Adam Rios. is here for cataract surgery.  Risks, benefits, limitations, and alternatives regarding cataract surgery have been reviewed with the patient.  Questions have been answered.  All  parties agreeable.   Benay Pillow, MD  07/22/2020, 8:55 AM

## 2020-07-22 NOTE — Transfer of Care (Signed)
Immediate Anesthesia Transfer of Care Note  Patient: Adam Rios.  Procedure(s) Performed: CATARACT EXTRACTION PHACO AND INTRAOCULAR LENS PLACEMENT (IOC) RIGHT (Right Eye)  Patient Location: PACU  Anesthesia Type: MAC  Level of Consciousness: awake, alert  and patient cooperative  Airway and Oxygen Therapy: Patient Spontanous Breathing and Patient connected to supplemental oxygen  Post-op Assessment: Post-op Vital signs reviewed, Patient's Cardiovascular Status Stable, Respiratory Function Stable, Patent Airway and No signs of Nausea or vomiting  Post-op Vital Signs: Reviewed and stable  Complications: No complications documented.

## 2020-07-23 ENCOUNTER — Encounter: Payer: Self-pay | Admitting: Ophthalmology

## 2020-07-29 ENCOUNTER — Encounter: Payer: Self-pay | Admitting: Ophthalmology

## 2020-08-01 ENCOUNTER — Other Ambulatory Visit: Payer: Self-pay

## 2020-08-01 ENCOUNTER — Other Ambulatory Visit
Admission: RE | Admit: 2020-08-01 | Discharge: 2020-08-01 | Disposition: A | Discharge status: Home / Self Care | Payer: Medicare (Managed Care) | Source: Ambulatory Visit | Attending: Ophthalmology | Admitting: Ophthalmology

## 2020-08-01 DIAGNOSIS — Z01812 Encounter for preprocedural laboratory examination: Secondary | ICD-10-CM | POA: Diagnosis present

## 2020-08-01 DIAGNOSIS — Z20822 Contact with and (suspected) exposure to covid-19: Secondary | ICD-10-CM | POA: Diagnosis not present

## 2020-08-01 LAB — SARS CORONAVIRUS 2 (TAT 6-24 HRS): SARS Coronavirus 2: NEGATIVE

## 2020-08-01 NOTE — Discharge Instructions (Signed)

## 2020-08-05 ENCOUNTER — Ambulatory Visit
Admission: RE | Admit: 2020-08-05 | Discharge: 2020-08-05 | Disposition: A | Discharge status: Home / Self Care | Payer: Medicare (Managed Care) | Attending: Ophthalmology | Admitting: Ophthalmology

## 2020-08-05 ENCOUNTER — Other Ambulatory Visit: Payer: Self-pay

## 2020-08-05 ENCOUNTER — Encounter: Payer: Self-pay | Admitting: Ophthalmology

## 2020-08-05 ENCOUNTER — Ambulatory Visit: Payer: Medicare (Managed Care) | Admitting: Anesthesiology

## 2020-08-05 ENCOUNTER — Encounter
Admission: RE | Disposition: A | Discharge status: Home / Self Care | Payer: Self-pay | Source: Home / Self Care | Attending: Ophthalmology

## 2020-08-05 DIAGNOSIS — I69319 Unspecified symptoms and signs involving cognitive functions following cerebral infarction: Secondary | ICD-10-CM | POA: Insufficient documentation

## 2020-08-05 DIAGNOSIS — Z7982 Long term (current) use of aspirin: Secondary | ICD-10-CM | POA: Insufficient documentation

## 2020-08-05 DIAGNOSIS — H2512 Age-related nuclear cataract, left eye: Secondary | ICD-10-CM | POA: Diagnosis not present

## 2020-08-05 DIAGNOSIS — Z85528 Personal history of other malignant neoplasm of kidney: Secondary | ICD-10-CM | POA: Diagnosis not present

## 2020-08-05 DIAGNOSIS — Z905 Acquired absence of kidney: Secondary | ICD-10-CM | POA: Insufficient documentation

## 2020-08-05 DIAGNOSIS — Z79899 Other long term (current) drug therapy: Secondary | ICD-10-CM | POA: Insufficient documentation

## 2020-08-05 DIAGNOSIS — Z888 Allergy status to other drugs, medicaments and biological substances status: Secondary | ICD-10-CM | POA: Diagnosis not present

## 2020-08-05 DIAGNOSIS — I69359 Hemiplegia and hemiparesis following cerebral infarction affecting unspecified side: Secondary | ICD-10-CM | POA: Diagnosis not present

## 2020-08-05 DIAGNOSIS — Z87891 Personal history of nicotine dependence: Secondary | ICD-10-CM | POA: Diagnosis not present

## 2020-08-05 DIAGNOSIS — N319 Neuromuscular dysfunction of bladder, unspecified: Secondary | ICD-10-CM | POA: Diagnosis not present

## 2020-08-05 DIAGNOSIS — Z961 Presence of intraocular lens: Secondary | ICD-10-CM | POA: Insufficient documentation

## 2020-08-05 DIAGNOSIS — Z9841 Cataract extraction status, right eye: Secondary | ICD-10-CM | POA: Insufficient documentation

## 2020-08-05 HISTORY — PX: CATARACT EXTRACTION W/PHACO: SHX586

## 2020-08-05 SURGERY — PHACOEMULSIFICATION, CATARACT, WITH IOL INSERTION
Anesthesia: Monitor Anesthesia Care | Site: Eye | Laterality: Left

## 2020-08-05 MED ORDER — ARMC OPHTHALMIC DILATING DROPS
1.0000 "application " | OPHTHALMIC | Status: DC | PRN
  Administered 2020-08-05 (×3): 1 via OPHTHALMIC

## 2020-08-05 MED ORDER — EPINEPHRINE PF 1 MG/ML IJ SOLN
INTRAOCULAR | Status: DC | PRN
  Administered 2020-08-05: 120 mL via OPHTHALMIC

## 2020-08-05 MED ORDER — ONDANSETRON HCL 4 MG/2ML IJ SOLN
INTRAMUSCULAR | Status: DC | PRN
  Administered 2020-08-05: 4 mg via INTRAVENOUS

## 2020-08-05 MED ORDER — SODIUM HYALURONATE 23 MG/ML IO SOLN
INTRAOCULAR | Status: DC | PRN
  Administered 2020-08-05: 0.6 mL via INTRAOCULAR

## 2020-08-05 MED ORDER — SODIUM HYALURONATE 10 MG/ML IO SOLN
INTRAOCULAR | Status: DC | PRN
  Administered 2020-08-05: 0.55 mL via INTRAOCULAR

## 2020-08-05 MED ORDER — MOXIFLOXACIN HCL 0.5 % OP SOLN
OPHTHALMIC | Status: DC | PRN
  Administered 2020-08-05: 0.2 mL via OPHTHALMIC

## 2020-08-05 MED ORDER — LIDOCAINE HCL (PF) 2 % IJ SOLN
INTRAOCULAR | Status: DC | PRN
  Administered 2020-08-05: 4 mL via INTRAOCULAR

## 2020-08-05 MED ORDER — MIDAZOLAM HCL 2 MG/2ML IJ SOLN
INTRAMUSCULAR | Status: DC | PRN
  Administered 2020-08-05: 1 mg via INTRAVENOUS

## 2020-08-05 MED ORDER — FENTANYL CITRATE (PF) 100 MCG/2ML IJ SOLN
INTRAMUSCULAR | Status: DC | PRN
  Administered 2020-08-05: 50 ug via INTRAVENOUS

## 2020-08-05 MED ORDER — TETRACAINE HCL 0.5 % OP SOLN
1.0000 [drp] | OPHTHALMIC | Status: DC | PRN
  Administered 2020-08-05 (×3): 1 [drp] via OPHTHALMIC

## 2020-08-05 MED ORDER — ACETAMINOPHEN 325 MG PO TABS: 325.0000 mg | ORAL_TABLET | Freq: Once | ORAL | Status: DC

## 2020-08-05 MED ORDER — DEXMEDETOMIDINE HCL 200 MCG/2ML IV SOLN
INTRAVENOUS | Status: DC | PRN
  Administered 2020-08-05: 10 ug via INTRAVENOUS

## 2020-08-05 MED ORDER — ACETAMINOPHEN 160 MG/5ML PO SOLN: 325.0000 mg | Freq: Once | ORAL | Status: DC

## 2020-08-05 MED ORDER — LACTATED RINGERS IV SOLN: INTRAVENOUS | Status: DC

## 2020-08-05 SURGICAL SUPPLY — 19 items
CANNULA ANT/CHMB 27G (MISCELLANEOUS) ×2 IMPLANT
CANNULA ANT/CHMB 27GA (MISCELLANEOUS) ×4 IMPLANT
DISSECTOR HYDRO NUCLEUS 50X22 (MISCELLANEOUS) ×2 IMPLANT
GLOVE SURG LX 7.5 STRW (GLOVE) ×1
GLOVE SURG LX STRL 7.5 STRW (GLOVE) ×1 IMPLANT
GLOVE SURG SYN 8.5  E (GLOVE) ×1
GLOVE SURG SYN 8.5 E (GLOVE) ×1 IMPLANT
GLOVE SURG SYN 8.5 PF PI (GLOVE) ×1 IMPLANT
GOWN STRL REUS W/ TWL LRG LVL3 (GOWN DISPOSABLE) ×2 IMPLANT
GOWN STRL REUS W/TWL LRG LVL3 (GOWN DISPOSABLE) ×4
LENS IOL TECNIS EYHANCE 19.0 (Intraocular Lens) ×1 IMPLANT
MARKER SKIN DUAL TIP RULER LAB (MISCELLANEOUS) ×2 IMPLANT
PACK DR. KING ARMS (PACKS) ×2 IMPLANT
PACK EYE AFTER SURG (MISCELLANEOUS) ×2 IMPLANT
PACK OPTHALMIC (MISCELLANEOUS) ×2 IMPLANT
SYR 3ML LL SCALE MARK (SYRINGE) ×2 IMPLANT
SYR TB 1ML LUER SLIP (SYRINGE) ×2 IMPLANT
WATER STERILE IRR 250ML POUR (IV SOLUTION) ×2 IMPLANT
WIPE NON LINTING 3.25X3.25 (MISCELLANEOUS) ×2 IMPLANT

## 2020-08-05 NOTE — Anesthesia Procedure Notes (Signed)
Procedure Name: MAC Date/Time: 08/05/2020 9:48 AM Performed by: Jeannene Patella, CRNA Pre-anesthesia Checklist: Patient identified, Emergency Drugs available, Suction available, Timeout performed and Patient being monitored Patient Re-evaluated:Patient Re-evaluated prior to induction Oxygen Delivery Method: Nasal cannula Placement Confirmation: positive ETCO2

## 2020-08-05 NOTE — Anesthesia Preprocedure Evaluation (Signed)
Anesthesia Evaluation  Patient identified by MRN, date of birth, ID band Patient awake    Reviewed: Allergy & Precautions, H&P , NPO status , Patient's Chart, lab work & pertinent test results  Airway Mallampati: II  TM Distance: >3 FB Neck ROM: full    Dental no notable dental hx.    Pulmonary former smoker,    Pulmonary exam normal breath sounds clear to auscultation       Cardiovascular hypertension, Normal cardiovascular exam Rhythm:regular Rate:Normal     Neuro/Psych CVA    GI/Hepatic GERD  ,  Endo/Other    Renal/GU Renal disease     Musculoskeletal   Abdominal   Peds  Hematology   Anesthesia Other Findings   Reproductive/Obstetrics                             Anesthesia Physical  Anesthesia Plan  ASA: II  Anesthesia Plan: MAC   Post-op Pain Management:    Induction:   PONV Risk Score and Plan: 1 and Treatment may vary due to age or medical condition, Midazolam and TIVA  Airway Management Planned:   Additional Equipment:   Intra-op Plan:   Post-operative Plan:   Informed Consent: I have reviewed the patients History and Physical, chart, labs and discussed the procedure including the risks, benefits and alternatives for the proposed anesthesia with the patient or authorized representative who has indicated his/her understanding and acceptance.     Dental Advisory Given  Plan Discussed with: CRNA  Anesthesia Plan Comments:         Anesthesia Quick Evaluation

## 2020-08-05 NOTE — Op Note (Signed)
OPERATIVE NOTE  Case [place] [id number] 08/05/2020   PREOPERATIVE DIAGNOSIS:  Nuclear sclerotic cataract left eye.  H25.12   POSTOPERATIVE DIAGNOSIS:    Nuclear sclerotic cataract left eye.     PROCEDURE:  Phacoemusification with posterior chamber intraocular lens placement of the left eye   LENS:   Implant Name Type Inv. Item Serial No. Manufacturer Lot No. LRB No. Used Action  LENS IOL TECNIS EYHANCE 19.0 - [id number] Intraocular Lens LENS IOL TECNIS EYHANCE 19.0 [id number] JOHNSON   Left 1 Implanted      Procedure(s) with comments: CATARACT EXTRACTION PHACO AND INTRAOCULAR LENS PLACEMENT (IOC) LEFT 10.73 01:13.8 (Left) - Use eye stretcher not chair  DIB00 +19.0   ULTRASOUND TIME: 1 minutes 13 seconds.  CDE 10.73   SURGEON:  Benay Pillow, MD, MPH   ANESTHESIA:  Topical with tetracaine drops augmented with 1% preservative-free intracameral lidocaine.  ESTIMATED BLOOD LOSS: <1 mL   COMPLICATIONS:  None.   DESCRIPTION OF PROCEDURE:  The patient was identified in the holding room and transported to the operating room and placed in the supine position under the operating microscope.    The patient had significant positioning difficulty and was placed in [place] position.  The left eye was identified as the operative eye and it was prepped and draped in the usual sterile ophthalmic fashion.   A 1.0 millimeter clear-corneal paracentesis was made at the 5:00 position. 0.5 ml of preservative-free 1% lidocaine with epinephrine was injected into the anterior chamber.  The anterior chamber was filled with Healon 5 viscoelastic.  A 2.4 millimeter keratome was used to make a near-clear corneal incision at the 2:00 position.  A curvilinear capsulorrhexis was made with a cystotome and capsulorrhexis forceps.  Balanced salt solution was used to hydrodissect and hydrodelineate the nucleus.   Phacoemulsification was then used in stop and chop fashion to remove the lens nucleus and  epinucleus.  The remaining cortex was then removed using the irrigation and aspiration handpiece. Healon was then placed into the capsular bag to distend it for lens placement.  A lens was then injected into the capsular bag.  The remaining viscoelastic was aspirated.   Wounds were hydrated with balanced salt solution.  The anterior chamber was inflated to a physiologic pressure with balanced salt solution.  Intracameral vigamox 0.1 mL undiltued was injected into the eye and a drop placed onto the ocular surface.  No wound leaks were noted.  The patient was taken to the recovery room in stable condition without complications of anesthesia or surgery  Benay Pillow 08/05/2020, 10:18 AM

## 2020-08-05 NOTE — H&P (Signed)
[facility]   Primary Care Physician:  Dola Argyle, MD Ophthalmologist: Dr. Benay Pillow  Pre-Procedure History & Physical: HPI:  Adam Rios. is a 80 y.o. male here for cataract surgery.   Past Medical History:  Diagnosis Date  . Cognitive impairment   . Complication of anesthesia    Fentanyl causes nausea  . 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 Hypertension   . Neurogenic bladder   . Renal cell carcinoma ([place]) 2005  . Renal disorder   . Stroke ([place])     01/17/18, 12/21   Some weakness and cognitive decline    Past Surgical History:  Procedure Laterality Date  . APPENDECTOMY    . CATARACT EXTRACTION W/PHACO Right 07/22/2020   Procedure: CATARACT EXTRACTION PHACO AND INTRAOCULAR LENS PLACEMENT ([place]) RIGHT;  Surgeon: Eulogio Bear, MD;  Location: [place];  Service: Ophthalmology;  Laterality: Right;  4.77 0:44.2  . HERNIA REPAIR    . LUMBAR FUSION    . NEPHRECTOMY Left 2005    Prior to Admission medications   Medication Sig Start Date End Date Taking? Authorizing Provider  acetaminophen (TYLENOL) 325 MG tablet Take 500 mg by mouth every 6 (six) hours as needed. 08/14/14  Yes [provider]  ASPIRIN 81 PO Take by mouth daily.   Yes [provider]  atorvastatin (LIPITOR) 40 MG tablet Take 40 mg by mouth daily.   Yes [provider]  donepezil (ARICEPT) 5 MG tablet Take 5 mg by mouth at bedtime.   Yes [provider]  furosemide (LASIX) 20 MG tablet Take 20 mg by mouth daily as needed.   Yes [provider]  gabapentin (NEURONTIN) 300 MG capsule Take 300 mg by mouth 2 (two) times daily.   Yes [provider]  losartan (COZAAR) 25 MG tablet Take 25 mg by mouth daily. 03/27/17 08/05/20 Yes [provider]  mirabegron ER (MYRBETRIQ) 25 MG TB24 tablet Take 25 mg by mouth daily.   Yes [provider]  nitrofurantoin (MACRODANTIN) 100 MG capsule Take 100 mg by  mouth daily.   Yes [provider]  omeprazole (PRILOSEC) 20 MG capsule Take 20 mg by mouth daily.   Yes [provider]  tamsulosin (FLOMAX) 0.4 MG CAPS capsule Take 0.4 mg by mouth daily.   Yes [provider]  tiZANidine (ZANAFLEX) 4 MG tablet Take 4 mg by mouth every 6 (six) hours as needed for muscle spasms.   Yes [provider]  vitamin B-12 (CYANOCOBALAMIN) 1000 MCG tablet Take 1,000 mcg by mouth daily.   Yes [provider]    Allergies as of 06/06/2020 - Review Complete 01/29/2018  Allergen Reaction Noted  . Fentanyl Nausea Only 07/29/2016    History reviewed. No pertinent family history.  Social History   Socioeconomic History  . Marital status: Divorced    Spouse name: Not on file  . Number of children: Not on file  . Years of education: Not on file  . Highest education level: Not on file  Occupational History  . Not on file  Tobacco Use  . Smoking status: Former Smoker    Packs/day: 0.25    Years: 10.00    Pack years: 2.50    Types: Cigarettes    Quit date: 2005    Years since quitting: 17.1  . Smokeless tobacco: Never Used  Vaping Use  . Vaping Use: Never used  Substance and Sexual Activity  . Alcohol use: Not Currently  .  Drug use: Never  . Sexual activity: Not on file  Other Topics Concern  . Not on file  Social History Narrative  . Not on file   Social Determinants of Health   Financial Resource Strain: Not on file  Food Insecurity: Not on file  Transportation Needs: Not on file  Physical Activity: Not on file  Stress: Not on file  Social Connections: Not on file  Intimate Partner Violence: Not on file    Review of Systems: See HPI, otherwise negative ROS  Physical Exam: BP 140/80   Pulse 70   Temp 98.3 F (36.8 C) (Temporal)   Resp 16   Ht 6\' 1"  (1.854 m)   Wt 101.8 kg   SpO2 100%   BMI 29.62 kg/m  General:   Alert,  pleasant and cooperative in NAD Head:  Normocephalic and  atraumatic. Respiratory:  Normal work of breathing.  Impression/Plan: Adam Rios. is here for cataract surgery.  Risks, benefits, limitations, and alternatives regarding cataract surgery have been reviewed with the patient.  Questions have been answered.  All parties agreeable.   Benay Pillow, MD  08/05/2020, 9:31 AM

## 2020-08-05 NOTE — Anesthesia Postprocedure Evaluation (Signed)
Anesthesia Post Note  Patient: Adam Rios.  Procedure(s) Performed: CATARACT EXTRACTION PHACO AND INTRAOCULAR LENS PLACEMENT (IOC) LEFT 10.73 01:13.8 (Left Eye)     Patient location during evaluation: PACU Anesthesia Type: MAC Level of consciousness: awake and alert and oriented Pain management: satisfactory to patient Vital Signs Assessment: post-procedure vital signs reviewed and stable Respiratory status: spontaneous breathing, nonlabored ventilation and respiratory function stable Cardiovascular status: blood pressure returned to baseline and stable Postop Assessment: Adequate PO intake and No signs of nausea or vomiting Anesthetic complications: no   No complications documented.  Raliegh Ip

## 2020-08-05 NOTE — Transfer of Care (Signed)
Immediate Anesthesia Transfer of Care Note  Patient: Adam Rios.  Procedure(s) Performed: CATARACT EXTRACTION PHACO AND INTRAOCULAR LENS PLACEMENT (IOC) LEFT 10.73 01:13.8 (Left Eye)  Patient Location: PACU  Anesthesia Type: MAC  Level of Consciousness: awake, alert  and patient cooperative  Airway and Oxygen Therapy: Patient Spontanous Breathing and Patient connected to supplemental oxygen  Post-op Assessment: Post-op Vital signs reviewed, Patient's Cardiovascular Status Stable, Respiratory Function Stable, Patent Airway and No signs of Nausea or vomiting  Post-op Vital Signs: Reviewed and stable  Complications: No complications documented.

## 2020-11-15 ENCOUNTER — Emergency Department
Admission: EM | Admit: 2020-11-15 | Discharge: 2020-11-15 | Disposition: A | Discharge status: Home / Self Care | Payer: Medicare (Managed Care) | Attending: Student in an Organized Health Care Education/Training Program | Admitting: Student in an Organized Health Care Education/Training Program

## 2020-11-15 DIAGNOSIS — N189 Chronic kidney disease, unspecified: Secondary | ICD-10-CM | POA: Diagnosis not present

## 2020-11-15 DIAGNOSIS — I129 Hypertensive chronic kidney disease with stage 1 through stage 4 chronic kidney disease, or unspecified chronic kidney disease: Secondary | ICD-10-CM | POA: Diagnosis not present

## 2020-11-15 DIAGNOSIS — N3090 Cystitis, unspecified without hematuria: Secondary | ICD-10-CM

## 2020-11-15 DIAGNOSIS — Z79899 Other long term (current) drug therapy: Secondary | ICD-10-CM | POA: Diagnosis not present

## 2020-11-15 DIAGNOSIS — N3091 Cystitis, unspecified with hematuria: Secondary | ICD-10-CM | POA: Diagnosis not present

## 2020-11-15 DIAGNOSIS — Z7982 Long term (current) use of aspirin: Secondary | ICD-10-CM | POA: Diagnosis not present

## 2020-11-15 DIAGNOSIS — Z87891 Personal history of nicotine dependence: Secondary | ICD-10-CM | POA: Diagnosis not present

## 2020-11-15 DIAGNOSIS — M545 Low back pain, unspecified: Secondary | ICD-10-CM | POA: Insufficient documentation

## 2020-11-15 DIAGNOSIS — Z85528 Personal history of other malignant neoplasm of kidney: Secondary | ICD-10-CM | POA: Insufficient documentation

## 2020-11-15 DIAGNOSIS — R319 Hematuria, unspecified: Secondary | ICD-10-CM | POA: Diagnosis present

## 2020-11-15 LAB — COMPREHENSIVE METABOLIC PANEL
ALT: 16 U/L (ref 0–44)
AST: 22 U/L (ref 15–41)
Albumin: 3.8 g/dL (ref 3.5–5.0)
Alkaline Phosphatase: 62 U/L (ref 38–126)
Anion gap: 5 (ref 5–15)
BUN: 21 mg/dL (ref 8–23)
CO2: 26 mmol/L (ref 22–32)
Calcium: 8.7 mg/dL — ABNORMAL LOW (ref 8.9–10.3)
Chloride: 105 mmol/L (ref 98–111)
Creatinine, Ser: 1.13 mg/dL (ref 0.61–1.24)
GFR, Estimated: >60 mL/min (ref >60)
Glucose, Bld: 142 mg/dL — ABNORMAL HIGH (ref 70–99)
Potassium: 3.9 mmol/L (ref 3.5–5.1)
Sodium: 136 mmol/L (ref 135–145)
Total Bilirubin: 0.6 mg/dL (ref 0.3–1.2)
Total Protein: 7 g/dL (ref 6.5–8.1)

## 2020-11-15 LAB — CBC WITH DIFFERENTIAL/PLATELET
Abs Immature Granulocytes: 0.03 10*3/uL (ref 0.00–0.07)
Basophils Absolute: 0 10*3/uL (ref 0.0–0.1)
Basophils Relative: 0 %
Eosinophils Absolute: 0.1 10*3/uL (ref 0.0–0.5)
Eosinophils Relative: 1 %
HCT: 41.1 % (ref 39.0–52.0)
Hemoglobin: 13.6 g/dL (ref 13.0–17.0)
Immature Granulocytes: 0 %
Lymphocytes Relative: 27 %
Lymphs Abs: 1.9 10*3/uL (ref 0.7–4.0)
MCH: 29.2 pg (ref 26.0–34.0)
MCHC: 33.1 g/dL (ref 30.0–36.0)
MCV: 88.2 fL (ref 80.0–100.0)
Monocytes Absolute: 0.5 10*3/uL (ref 0.1–1.0)
Monocytes Relative: 7 %
Neutro Abs: 4.6 10*3/uL (ref 1.7–7.7)
Neutrophils Relative %: 65 %
Platelets: 193 10*3/uL (ref 150–400)
RBC: 4.66 MIL/uL (ref 4.22–5.81)
RDW: 13.6 % (ref 11.5–15.5)
WBC: 7.2 10*3/uL (ref 4.0–10.5)
nRBC: 0 % (ref 0.0–0.2)

## 2020-11-15 LAB — URINALYSIS, COMPLETE (UACMP) WITH MICROSCOPIC
Bilirubin Urine: NEGATIVE
Glucose, UA: NEGATIVE mg/dL
Ketones, ur: 5 mg/dL — AB
Nitrite: NEGATIVE
Protein, ur: NEGATIVE mg/dL
Specific Gravity, Urine: 1.027 (ref 1.005–1.030)
pH: 5 (ref 5.0–8.0)

## 2020-11-15 MED ORDER — CIPROFLOXACIN HCL 500 MG PO TABS: 500.0000 mg | ORAL_TABLET | Freq: Two times a day (BID) | ORAL | 0 refills | Status: DC

## 2020-11-15 MED ORDER — CIPROFLOXACIN HCL 500 MG PO TABS: 500.0000 mg | ORAL_TABLET | Freq: Two times a day (BID) | ORAL | 0 refills | Status: AC

## 2020-11-15 NOTE — ED Notes (Signed)
Pt states coming in because earlier today his penis started to bleed. Pt states it is not blood in his urine, it is just a bloody discharge. Pt states it is worse when up/walking. Pt states he does self cath at home.

## 2020-11-15 NOTE — Discharge Instructions (Signed)
Take Cipro twice daily for ten days.  

## 2020-11-15 NOTE — ED Notes (Signed)
Pt assisted to self-cath and clean self after cath due to blood in brief. Clean brief applied. Pt remains in recliner in position of comfort.

## 2020-11-15 NOTE — ED Provider Notes (Signed)
[facility]-EMERGENCY DEPARTMENT  ____________________________________________  Time seen: Approximately 7:59 PM  I have reviewed the triage vital signs and the nursing notes.   HISTORY  Chief Complaint Hematuria   Historian Patient     HPI Adam Rios. is a 80 y.o. male presents to the emergency department with hematuria.  Patient has a history of renal cell carcinoma and self caths daily.  Patient reports recent copious hematuria and became concerned.  Patient has had some mild pain in the low back.  No nausea or vomiting.  He has been afebrile at home.  No other alleviating measures have been attempted.   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[place])    Hypertension    Neurogenic bladder    Renal cell carcinoma ([place]) 2005   Renal disorder    Stroke ([place])     01/17/18, 12/21   Some weakness and cognitive decline    Patient Active Problem List   Diagnosis Date Noted   Acute encephalopathy 01/30/2018   Chronic kidney disease 04/05/2017   GERD (gastroesophageal reflux disease) 04/05/2017   Pain 04/05/2017   Weakness 04/05/2017   Essential hypertension 07/31/2014   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 04/10/2014   Ventral hernia without obstruction or gangrene 04/10/2014   Acute low back pain without sciatica 12/26/2013   Fusion of spine of lumbar region 12/26/2013   Edema 09/26/2013   Hemorrhoid 09/26/2013   Neurogenic bladder 09/26/2013   Renal cell cancer ([place])  09/26/2013   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   Lower urinary tract infectious disease 10/28/2012    Past Surgical History:  Procedure Laterality Date   APPENDECTOMY     CATARACT EXTRACTION W/PHACO Right 07/22/2020   Procedure: CATARACT EXTRACTION PHACO AND INTRAOCULAR LENS PLACEMENT ([place]) RIGHT;  Surgeon: Eulogio Bear, MD;  Location: [facility];  Service: Ophthalmology;  Laterality: Right;  4.77 0:44.2   CATARACT EXTRACTION W/PHACO Left 08/05/2020   Procedure: CATARACT EXTRACTION PHACO AND INTRAOCULAR LENS PLACEMENT (IOC) LEFT 10.73 01:13.8;  Surgeon: Eulogio Bear, MD;  Location: [facility];  Service: Ophthalmology;  Laterality: Left;  Use eye stretcher not chair   HERNIA REPAIR     LUMBAR FUSION     NEPHRECTOMY Left 2005    Prior to Admission medications   Medication Sig Start Date End Date Taking? Authorizing Provider  acetaminophen (TYLENOL) 325 MG tablet Take 500 mg by mouth every 6 (six) hours as needed. 08/14/14   [provider]  ASPIRIN 81 PO Take by mouth daily.    [provider]  atorvastatin (LIPITOR) 40 MG tablet Take 40 mg by mouth daily.    [provider]  ciprofloxacin (CIPRO) 500 MG tablet Take 1 tablet (500 mg total) by mouth 2 (two) times daily for 10 days. 11/15/20 11/25/20  Lannie Fields, PA-C  donepezil (ARICEPT) 5 MG tablet Take 5 mg by mouth at bedtime.  [provider]  furosemide (LASIX) 20 MG tablet Take 20 mg by mouth daily as needed.    [provider]  gabapentin (NEURONTIN) 300 MG capsule Take 300 mg by mouth 2 (two) times daily.    [provider]  losartan (COZAAR) 25 MG tablet Take 25 mg by mouth daily. 03/27/17 08/05/20  [provider]  mirabegron ER (MYRBETRIQ) 25 MG TB24 tablet Take 25 mg by mouth daily.    [provider]  nitrofurantoin (MACRODANTIN) 100 MG capsule Take 100 mg by mouth daily.    [provider]  omeprazole (PRILOSEC) 20 MG capsule  Take 20 mg by mouth daily.    [provider]  tamsulosin (FLOMAX) 0.4 MG CAPS capsule Take 0.4 mg by mouth daily.    [provider]  tiZANidine (ZANAFLEX) 4 MG tablet Take 4 mg by mouth every 6 (six) hours as needed for muscle spasms.    [provider]  vitamin B-12 (CYANOCOBALAMIN) 1000 MCG tablet Take 1,000 mcg by mouth daily.    [provider]    Allergies Fentanyl  No family history on file.  Social History Social History   Tobacco Use   Smoking status: Former    Packs/day: 0.25    Years: 10.00    Pack years: 2.50    Types: Cigarettes    Quit date: 2005    Years since quitting: 17.4   Smokeless tobacco: Never  Vaping Use   Vaping Use: Never used  Substance Use Topics   Alcohol use: Not Currently   Drug use: Never     Review of Systems  Constitutional: No fever/chills Eyes:  No discharge ENT: No upper respiratory complaints. Respiratory: no cough. No SOB/ use of accessory muscles to breath Gastrointestinal:   No nausea, no vomiting.  No diarrhea.  No constipation. Genitourinary: Patient has hematuria.  Musculoskeletal: Negative for musculoskeletal pain. Skin: Negative for rash, abrasions, lacerations, ecchymosis.   ____________________________________________   PHYSICAL EXAM:  VITAL SIGNS: ED Triage Vitals  Enc Vitals Group     BP 11/15/20 1822 (!) 145/81     Pulse Rate 11/15/20 1822 82     Resp 11/15/20 1822 18     Temp 11/15/20 1822 98.1 F (36.7 C)     Temp Source 11/15/20 1822 Oral     SpO2 11/15/20 1822 97 %     Weight --      Height --      Head Circumference --      Peak Flow --      Pain Score 11/15/20 1839 7     Pain Loc --      Pain Edu? --      Excl. in [place]? --      Constitutional: Alert and oriented. Well appearing and in no acute distress. Eyes: Conjunctivae are normal. PERRL. EOMI. Head: Atraumatic. ENT:      Nose: No congestion/rhinnorhea.      Mouth/Throat: Mucous membranes are moist.   Neck: No stridor.  No cervical spine tenderness to palpation. Cardiovascular: Normal rate, regular rhythm. Normal S1 and S2.  Good peripheral circulation. Respiratory: Normal respiratory effort without tachypnea or retractions. Lungs CTAB. Good air entry to the bases with no decreased or absent breath sounds Gastrointestinal: Bowel sounds x 4 quadrants. Soft and nontender to palpation. No guarding or rigidity. No distention. Musculoskeletal: Full range of motion to all extremities. No obvious deformities noted Neurologic:  Normal for age. No gross focal neurologic deficits are appreciated.  Skin:  Skin is warm, dry and intact. No rash noted. Psychiatric: Mood and affect are normal for age. Speech and behavior are normal.   ____________________________________________   LABS (all labs ordered are listed, but only abnormal results are displayed)  Labs Reviewed  URINALYSIS, COMPLETE (UACMP) WITH MICROSCOPIC - Abnormal; Notable for the following components:      Result Value   Color, Urine YELLOW (*)    APPearance CLEAR (*)    Hgb urine dipstick MODERATE (*)    Ketones, ur 5 (*)    Leukocytes,Ua SMALL (*)    Bacteria, UA RARE (*)    All other components within normal limits  COMPREHENSIVE METABOLIC PANEL - Abnormal; Notable for the following components:   Glucose, Bld 142 (*)    Calcium 8.7 (*)    All other components within normal limits  URINE CULTURE  CBC WITH DIFFERENTIAL/PLATELET   ____________________________________________  EKG   ____________________________________________  RADIOLOGY Unk Pinto, personally viewed and evaluated these images (plain radiographs) as part of my medical decision making, as well as reviewing the written report by the radiologist.  No results found.  ____________________________________________    PROCEDURES  Procedure(s) performed:     Procedures     Medications - No data to  display   ____________________________________________   INITIAL IMPRESSION / ASSESSMENT AND PLAN / ED COURSE  Pertinent labs & imaging results that were available during my care of the patient were reviewed by me and considered in my medical decision making (see chart for details).      Assessment and Plan:  Hematuria 80 year old male presents to the emergency department with hematuria that he noticed today.  Vital signs were reassuring at triage.  On physical exam, patient was alert and nontoxic-appearing.  CBC and CMP were reassuring.  Urinalysis is concerning for early UTI with moderate blood and rare bacteria from catheter urine sample.  We will treat with Cipro twice daily for the next 10 days.  Urine culture is pending at this time.  Return precautions were given to return with new or worsening symptoms.   ____________________________________________  FINAL CLINICAL IMPRESSION(S) / ED DIAGNOSES  Final diagnoses:  Cystitis      NEW MEDICATIONS STARTED DURING THIS VISIT:  ED Discharge Orders          Ordered    ciprofloxacin (CIPRO) 500 MG tablet  2 times daily,   Status:  Discontinued        11/15/20 2134    ciprofloxacin (CIPRO) 500 MG tablet  2 times daily        11/15/20 2155                This chart was dictated using voice recognition software/Dragon. Despite best efforts to proofread, errors can occur which can change the meaning. Any change was purely unintentional.     Lannie Fields, PA-C 11/15/20 2231    Merlyn Lot, MD 11/16/20 [phone]

## 2020-11-15 NOTE — ED Notes (Signed)
First Nurse Note: Pt to ED via EMS for penile bleeding, pt self-caths. VSS per EMS

## 2020-11-15 NOTE — ED Triage Notes (Signed)
Pt reports he self caths for urine and has done so for 10 years. Today, pt reports bright red blood from penis. Pt denies other symptoms.

## 2020-11-18 LAB — URINE CULTURE: Culture: 50000 — AB

## 2020-12-31 ENCOUNTER — Other Ambulatory Visit: Payer: Self-pay

## 2020-12-31 ENCOUNTER — Emergency Department: Payer: Medicare (Managed Care)

## 2020-12-31 ENCOUNTER — Emergency Department
Admission: EM | Admit: 2020-12-31 | Discharge: 2020-12-31 | Discharge status: Against Medical Advice | Payer: Medicare (Managed Care) | Attending: Emergency Medicine | Admitting: Emergency Medicine

## 2020-12-31 DIAGNOSIS — R109 Unspecified abdominal pain: Secondary | ICD-10-CM | POA: Diagnosis present

## 2020-12-31 DIAGNOSIS — T83091A Other mechanical complication of indwelling urethral catheter, initial encounter: Secondary | ICD-10-CM | POA: Diagnosis not present

## 2020-12-31 DIAGNOSIS — N189 Chronic kidney disease, unspecified: Secondary | ICD-10-CM | POA: Diagnosis not present

## 2020-12-31 DIAGNOSIS — Z7982 Long term (current) use of aspirin: Secondary | ICD-10-CM | POA: Diagnosis not present

## 2020-12-31 DIAGNOSIS — I129 Hypertensive chronic kidney disease with stage 1 through stage 4 chronic kidney disease, or unspecified chronic kidney disease: Secondary | ICD-10-CM | POA: Diagnosis not present

## 2020-12-31 DIAGNOSIS — Z85520 Personal history of malignant carcinoid tumor of kidney: Secondary | ICD-10-CM | POA: Diagnosis not present

## 2020-12-31 DIAGNOSIS — Z87891 Personal history of nicotine dependence: Secondary | ICD-10-CM | POA: Diagnosis not present

## 2020-12-31 DIAGNOSIS — Z79899 Other long term (current) drug therapy: Secondary | ICD-10-CM | POA: Diagnosis not present

## 2020-12-31 LAB — URINALYSIS, COMPLETE (UACMP) WITH MICROSCOPIC
Bilirubin Urine: NEGATIVE
Glucose, UA: NEGATIVE mg/dL
Ketones, ur: 5 mg/dL — AB
Nitrite: NEGATIVE
Protein, ur: 100 mg/dL — AB
RBC / HPF: >50 RBC/hpf — ABNORMAL HIGH (ref 0–5)
Specific Gravity, Urine: 1.023 (ref 1.005–1.030)
WBC, UA: >50 WBC/hpf — ABNORMAL HIGH (ref 0–5)
pH: 5 (ref 5.0–8.0)

## 2020-12-31 LAB — BASIC METABOLIC PANEL
Anion gap: 7 (ref 5–15)
BUN: 19 mg/dL (ref 8–23)
CO2: 25 mmol/L (ref 22–32)
Calcium: 8.7 mg/dL — ABNORMAL LOW (ref 8.9–10.3)
Chloride: 104 mmol/L (ref 98–111)
Creatinine, Ser: 1.24 mg/dL (ref 0.61–1.24)
GFR, Estimated: 59 mL/min — ABNORMAL LOW (ref >60)
Glucose, Bld: 122 mg/dL — ABNORMAL HIGH (ref 70–99)
Potassium: 4 mmol/L (ref 3.5–5.1)
Sodium: 136 mmol/L (ref 135–145)

## 2020-12-31 LAB — CBC
HCT: 36.4 % — ABNORMAL LOW (ref 39.0–52.0)
Hemoglobin: 11.6 g/dL — ABNORMAL LOW (ref 13.0–17.0)
MCH: 28.4 pg (ref 26.0–34.0)
MCHC: 31.9 g/dL (ref 30.0–36.0)
MCV: 89 fL (ref 80.0–100.0)
Platelets: 232 10*3/uL (ref 150–400)
RBC: 4.09 MIL/uL — ABNORMAL LOW (ref 4.22–5.81)
RDW: 13.9 % (ref 11.5–15.5)
WBC: 9.1 10*3/uL (ref 4.0–10.5)
nRBC: 0 % (ref 0.0–0.2)

## 2020-12-31 MED ORDER — OXYBUTYNIN CHLORIDE 5 MG PO TABS
2.5000 mg | ORAL_TABLET | Freq: Once | ORAL | Status: DC
  Filled 2020-12-31: qty 0.5

## 2020-12-31 MED ORDER — IOHEXOL 350 MG/ML SOLN: 100.0000 mL | Freq: Once | INTRAVENOUS | Status: DC | PRN

## 2020-12-31 NOTE — ED Notes (Signed)
Pt states that he was seen by the urologist and had his foley size changed from a 16Fr to 14Fr and has been experiencing increased pain and pressure of his bladder.

## 2020-12-31 NOTE — ED Notes (Signed)
Bladder scan preformed. 134m of urine in pt bladder at this time.

## 2020-12-31 NOTE — ED Notes (Signed)
E-signature refused by the patient - Pt verbalized understanding of D/C information - no additional concerns at this time.   Pt provided a wheelchair and escorted to the lobby.

## 2020-12-31 NOTE — ED Notes (Signed)
Pts foley irrigated with 100ccs of NS - returned 100cc no clots noted - MD notified.

## 2020-12-31 NOTE — ED Notes (Signed)
Pt stated, "I am in a lot of pain. I am ready to take this cathter out myself." Pt made aware not to take the cathter out hisself as he can cause some damage. Vaughan Basta, RN made aware that pt is in a lot of pain and is wanting to pull cathter out.

## 2020-12-31 NOTE — ED Triage Notes (Signed)
Pt reports pain in bladder for one hour. Pt had foley placed 2 days ago. Pt states has noticed decreased urine output since noon.  Pt bladder scanned, 169m in bladder Hx clots in bladder

## 2020-12-31 NOTE — ED Notes (Addendum)
Pt refused CT scan stating he need "to go home".

## 2020-12-31 NOTE — ED Notes (Signed)
[place] @ bedside for MD.

## 2020-12-31 NOTE — ED Provider Notes (Signed)
[facility] Emergency Department Provider Note  ____________________________________________   Event Date/Time   First MD Initiated Contact with Patient 12/31/20 1937     (approximate)  I have reviewed the triage vital signs and the nursing notes.   HISTORY  Chief Complaint cath issue    HPI Adam Rios. is a 80 y.o. 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  Patient reports pain bladder for about 1 hour.  Had Foley placed 2 days ago when he noticed decreased urine output since noon.  Bladder scan in triage show 125.  Patient states that his concern is that the Foley is not draining as much as it was earlier and that he is not having more pain in his abdomen that is severe, constant, nothing makes it better or worse.  He denies any fevers.  Reports taking his antibiotics.   On review of records patient was seen on 7/31 and had a larger Foley placed and started on Bactrim for 10 days.  It appears the patient was placed on a 14 [place] Foley.  The patient was given a dose of ertapenem for prior E. coli UTIs.       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[place])    Hypertension    Neurogenic bladder    Renal cell carcinoma ([place]) 2005   Renal disorder    Stroke ([place])     01/17/18, 12/21   Some weakness and cognitive decline    Patient Active Problem List   Diagnosis Date Noted   Acute encephalopathy 01/30/2018   Chronic kidney disease 04/05/2017   GERD (gastroesophageal reflux disease) 04/05/2017   Pain 04/05/2017   Weakness 04/05/2017   Essential hypertension 07/31/2014   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 04/10/2014   Ventral hernia without obstruction or gangrene 04/10/2014   Acute low back pain without sciatica 12/26/2013   Fusion of spine  of lumbar region 12/26/2013   Edema 09/26/2013   Hemorrhoid 09/26/2013   Neurogenic bladder 09/26/2013   [place] cell cancer ([place]) 09/26/2013   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   Lower urinary tract infectious disease 10/28/2012    Past Surgical History:  Procedure Laterality Date   APPENDECTOMY     CATARACT EXTRACTION W/PHACO Right 07/22/2020   Procedure: CATARACT EXTRACTION PHACO AND INTRAOCULAR LENS PLACEMENT ([place]) RIGHT;  Surgeon: Eulogio Bear, MD;  Location: [facility];  Service: Ophthalmology;  Laterality: Right;  4.77 0:44.2   CATARACT EXTRACTION W/PHACO Left 08/05/2020   Procedure: CATARACT EXTRACTION PHACO AND INTRAOCULAR LENS PLACEMENT ([facility]) LEFT 10.73 01:13.8;  Surgeon: Eulogio Bear, MD;  Location: [facility];  Service: Ophthalmology;  Laterality: Left;  Use eye stretcher not chair   HERNIA REPAIR     LUMBAR FUSION     NEPHRECTOMY Left 2005    Prior to Admission medications   Medication Sig Start Date End Date Taking? Authorizing Provider  acetaminophen (TYLENOL) 325 MG tablet Take 500 mg by mouth every 6 (six) hours as needed. 08/14/14   [provider]  ASPIRIN 81 PO Take by mouth daily.    [provider]  atorvastatin (LIPITOR) 40 MG tablet Take 40 mg by mouth daily.    [provider]  donepezil (ARICEPT) 5 MG tablet Take 5 mg by mouth at bedtime.  [provider]  furosemide (LASIX) 20 MG tablet Take 20 mg by mouth daily as needed.    [provider]  gabapentin (NEURONTIN) 300 MG capsule Take 300 mg by mouth 2 (two) times daily.    [provider]  losartan (COZAAR) 25 MG tablet Take 25 mg by mouth daily. 03/27/17 08/05/20  [provider]  mirabegron ER (MYRBETRIQ) 25 MG TB24 tablet Take 25 mg by mouth daily.    [provider]  nitrofurantoin (MACRODANTIN) 100 MG capsule Take 100 mg by mouth daily.    [provider]  omeprazole (PRILOSEC) 20 MG capsule Take 20 mg by  mouth daily.    [provider]  tamsulosin (FLOMAX) 0.4 MG CAPS capsule Take 0.4 mg by mouth daily.    [provider]  tiZANidine (ZANAFLEX) 4 MG tablet Take 4 mg by mouth every 6 (six) hours as needed for muscle spasms.    [provider]  vitamin B-12 (CYANOCOBALAMIN) 1000 MCG tablet Take 1,000 mcg by mouth daily.    [provider]    Allergies Fentanyl  No family history on file.  Social History Social History   Tobacco Use   Smoking status: Former    Packs/day: 0.25    Years: 10.00    Pack years: 2.50    Types: Cigarettes    Quit date: 2005    Years since quitting: 17.5   Smokeless tobacco: Never  Vaping Use   Vaping Use: Never used  Substance Use Topics   Alcohol use: Not Currently   Drug use: Never      Review of Systems Constitutional: No fever/chills Eyes: No visual changes. ENT: No sore throat. Cardiovascular: Denies chest pain. Respiratory: Denies shortness of breath. Gastrointestinal: Positive abdominal pain Genitourinary: Negative for dysuria. Musculoskeletal: Negative for back pain. Skin: Negative for rash. Neurological: Negative for headaches, focal weakness or numbness. All other ROS negative ____________________________________________   PHYSICAL EXAM:  VITAL SIGNS: ED Triage Vitals  Enc Vitals Group     BP 12/31/20 1703 116/73     Pulse Rate 12/31/20 1703 94     Resp 12/31/20 1703 18     Temp 12/31/20 1703 98.1 F (36.7 C)     Temp Source 12/31/20 1703 Oral     SpO2 12/31/20 1703 97 %     Weight 12/31/20 1704 223 lb (101.2 kg)     Height 12/31/20 1704 '6\' 1"'$  (1.854 m)     Head Circumference --      Peak Flow --      Pain Score 12/31/20 1708 10     Pain Loc --      Pain Edu? --      Excl. in [place]? --     Constitutional: Alert and oriented. Well appearing and in no acute distress. Eyes: Conjunctivae are normal. EOMI. Head: Atraumatic. Nose: No congestion/rhinnorhea. Mouth/Throat: Mucous  membranes are moist.   Neck: No stridor. Trachea Midline. FROM Cardiovascular: Normal rate, regular rhythm. Grossly normal heart sounds.  Good peripheral circulation. Respiratory: Normal respiratory effort.  No retractions. Lungs CTAB. Gastrointestinal: Tender in the lower abdomen.  No distention. No abdominal bruits.  Musculoskeletal: No lower extremity tenderness nor edema.  No joint effusions. Neurologic:  Normal speech and language. No gross focal neurologic deficits are appreciated.  Skin:  Skin is warm, dry and intact. No rash noted. Psychiatric: Mood and affect are normal. Speech and behavior are normal. GU: Foley catheter in place without any clots noted in the  bag but little bit red-tinged.  ____________________________________________   LABS (all labs ordered are listed, but only abnormal results are displayed)  Labs Reviewed  URINALYSIS, COMPLETE (UACMP) WITH MICROSCOPIC - Abnormal; Notable for the following components:      Result Value   Color, Urine AMBER (*)    APPearance CLOUDY (*)    Hgb urine dipstick LARGE (*)    Ketones, ur 5 (*)    Protein, ur 100 (*)    Leukocytes,Ua LARGE (*)    RBC / HPF >50 (*)    WBC, UA >50 (*)    Bacteria, UA RARE (*)    All other components within normal limits  CBC - Abnormal; Notable for the following components:   RBC 4.09 (*)    Hemoglobin 11.6 (*)    HCT 36.4 (*)    All other components within normal limits  BASIC METABOLIC PANEL - Abnormal; Notable for the following components:   Glucose, Bld 122 (*)    Calcium 8.7 (*)    GFR, Estimated 59 (*)    All other components within normal limits  URINE CULTURE   ____________________________________________   INITIAL IMPRESSION / ASSESSMENT AND PLAN / ED COURSE  Christiano Ledman. was evaluated in Emergency Department on 12/31/2020 for the symptoms described in the history of present illness. He was evaluated in the context of the global COVID-19 pandemic, which necessitated  consideration that the patient might be at risk for infection with the SARS-CoV-2 virus that causes COVID-19. Institutional protocols and algorithms that pertain to the evaluation of patients at risk for COVID-19 are in a state of rapid change based on information released by regulatory bodies including the CDC and federal and state organizations. These policies and algorithms were followed during the patient's care in the ED.    Patient is a 80 year old gentleman who comes in with concerns for urinary retention.  Patient already had bladder scan triage that only had 125 mL in his bladder.  No clots were noted.  On recheck after 3 hours and placed into a room he continued not to have any retention noted on bladder scan.  My examination he is very tender in his abdomen but does not seem to be on his bladder.  I did have the nurse attempt to irrigate it and 100 cc came out after 100 cc was placed.  There were no clots noted.  After this patient states that he felt better but there was no signs of retention so I am confused as to why this helped his pain.  Discussed that he could be bladder spasm but given that he was having diffuse abdominal pain I recommended CT imaging to make sure no evidence of other acute pathology such as AAA, diverticulitis or life-threatening causes.  He states that now his pain is all gone and he is declining CT imaging.  Because he states that he wants to go home.  I even confirmed with a bedside ultrasound that there was no urine in his bladder and his Foley catheter appeared to be in place.  At this time there is no retention I tried to explain to him that this does not explain why he had all this pain earlier but he is adamant that he does not want CT imaging.  His daughter is on the phone who witnessed this this whole conversation he has the ability to make this decision and is able to repeat back to me the risk including missing something in his  abdomen that could be potentially  life-threatening.  He continues to deny CT imaging and wants to be picked up by his daughter.  His urine did look concerning for UTI but he is already on antibiotics is not febrile or have a white count.  Will send for culture given I cannot see [facility]'s culture to make sure that it susceptible to the Bactrim that he is on.  At this time he continues to request going home we will have him leave AMA given I am not sure if he had any abdominal pathology that could be more life-threatening         ____________________________________________   FINAL CLINICAL IMPRESSION(S) / ED DIAGNOSES   Final diagnoses:  Abdominal pain, unspecified abdominal location      MEDICATIONS GIVEN DURING THIS VISIT:  Medications  oxybutynin (DITROPAN) tablet 2.5 mg (2.5 mg Oral Patient Refused/Not Given 12/31/20 2059)  iohexol (OMNIPAQUE) 350 MG/ML injection 100 mL (has no administration in time range)     ED Discharge Orders     None        Note:  This document was prepared using Dragon voice recognition software and may include unintentional dictation errors.    Vanessa [place], MD 12/31/20 2129

## 2020-12-31 NOTE — Discharge Instructions (Addendum)
We recommended CT imaging to further evaluate your abdomen to make sure there is no other cause for your abdominal pain given there is no signs of retention today.  You have declined and have elected to leave Adam Rios.  If you decide to return to the ER for repeat evaluation due to recurrent pain and you are welcome to do so.

## 2021-01-02 LAB — URINE CULTURE: Culture: NO GROWTH

## 2021-05-14 ENCOUNTER — Other Ambulatory Visit: Payer: Self-pay

## 2021-05-14 ENCOUNTER — Emergency Department: Payer: Medicare (Managed Care)

## 2021-05-14 ENCOUNTER — Inpatient Hospital Stay
Admission: EM | Admit: 2021-05-14 | Discharge: 2021-05-21 | DRG: 698 | Disposition: A | Discharge status: Skilled Nursing Facility | Payer: Medicare (Managed Care) | Attending: Internal Medicine | Admitting: Internal Medicine

## 2021-05-14 DIAGNOSIS — N3000 Acute cystitis without hematuria: Secondary | ICD-10-CM | POA: Diagnosis not present

## 2021-05-14 DIAGNOSIS — Y846 Urinary catheterization as the cause of abnormal reaction of the patient, or of later complication, without mention of misadventure at the time of the procedure: Secondary | ICD-10-CM | POA: Diagnosis present

## 2021-05-14 DIAGNOSIS — G9341 Metabolic encephalopathy: Secondary | ICD-10-CM | POA: Diagnosis not present

## 2021-05-14 DIAGNOSIS — Z981 Arthrodesis status: Secondary | ICD-10-CM | POA: Diagnosis not present

## 2021-05-14 DIAGNOSIS — R4189 Other symptoms and signs involving cognitive functions and awareness: Secondary | ICD-10-CM | POA: Diagnosis present

## 2021-05-14 DIAGNOSIS — Z905 Acquired absence of kidney: Secondary | ICD-10-CM | POA: Diagnosis not present

## 2021-05-14 DIAGNOSIS — R410 Disorientation, unspecified: Secondary | ICD-10-CM | POA: Diagnosis not present

## 2021-05-14 DIAGNOSIS — M25531 Pain in right wrist: Secondary | ICD-10-CM | POA: Diagnosis present

## 2021-05-14 DIAGNOSIS — Z7982 Long term (current) use of aspirin: Secondary | ICD-10-CM | POA: Diagnosis not present

## 2021-05-14 DIAGNOSIS — E785 Hyperlipidemia, unspecified: Secondary | ICD-10-CM | POA: Diagnosis present

## 2021-05-14 DIAGNOSIS — I10 Essential (primary) hypertension: Secondary | ICD-10-CM | POA: Diagnosis present

## 2021-05-14 DIAGNOSIS — Z85528 Personal history of other malignant neoplasm of kidney: Secondary | ICD-10-CM | POA: Diagnosis not present

## 2021-05-14 DIAGNOSIS — Z87891 Personal history of nicotine dependence: Secondary | ICD-10-CM

## 2021-05-14 DIAGNOSIS — I491 Atrial premature depolarization: Secondary | ICD-10-CM | POA: Diagnosis present

## 2021-05-14 DIAGNOSIS — Z79899 Other long term (current) drug therapy: Secondary | ICD-10-CM

## 2021-05-14 DIAGNOSIS — R531 Weakness: Secondary | ICD-10-CM | POA: Diagnosis present

## 2021-05-14 DIAGNOSIS — N400 Enlarged prostate without lower urinary tract symptoms: Secondary | ICD-10-CM | POA: Diagnosis present

## 2021-05-14 DIAGNOSIS — T83518A Infection and inflammatory reaction due to other urinary catheter, initial encounter: Principal | ICD-10-CM | POA: Diagnosis present

## 2021-05-14 DIAGNOSIS — E7849 Other hyperlipidemia: Secondary | ICD-10-CM | POA: Diagnosis not present

## 2021-05-14 DIAGNOSIS — Z87440 Personal history of urinary (tract) infections: Secondary | ICD-10-CM | POA: Diagnosis not present

## 2021-05-14 DIAGNOSIS — F0390 Unspecified dementia without behavioral disturbance: Secondary | ICD-10-CM | POA: Diagnosis not present

## 2021-05-14 DIAGNOSIS — N319 Neuromuscular dysfunction of bladder, unspecified: Secondary | ICD-10-CM | POA: Diagnosis not present

## 2021-05-14 DIAGNOSIS — Z885 Allergy status to narcotic agent status: Secondary | ICD-10-CM

## 2021-05-14 DIAGNOSIS — Z7902 Long term (current) use of antithrombotics/antiplatelets: Secondary | ICD-10-CM | POA: Diagnosis not present

## 2021-05-14 DIAGNOSIS — W19XXXA Unspecified fall, initial encounter: Secondary | ICD-10-CM

## 2021-05-14 DIAGNOSIS — Y92009 Unspecified place in unspecified non-institutional (private) residence as the place of occurrence of the external cause: Secondary | ICD-10-CM

## 2021-05-14 DIAGNOSIS — S2241XA Multiple fractures of ribs, right side, initial encounter for closed fracture: Secondary | ICD-10-CM | POA: Diagnosis present

## 2021-05-14 DIAGNOSIS — Z20822 Contact with and (suspected) exposure to covid-19: Secondary | ICD-10-CM | POA: Diagnosis present

## 2021-05-14 DIAGNOSIS — N390 Urinary tract infection, site not specified: Secondary | ICD-10-CM | POA: Diagnosis present

## 2021-05-14 DIAGNOSIS — W1830XA Fall on same level, unspecified, initial encounter: Secondary | ICD-10-CM | POA: Diagnosis present

## 2021-05-14 DIAGNOSIS — Z8673 Personal history of transient ischemic attack (TIA), and cerebral infarction without residual deficits: Secondary | ICD-10-CM

## 2021-05-14 DIAGNOSIS — R0600 Dyspnea, unspecified: Secondary | ICD-10-CM

## 2021-05-14 LAB — URINALYSIS, ROUTINE W REFLEX MICROSCOPIC
Bilirubin Urine: NEGATIVE
Glucose, UA: NEGATIVE mg/dL
Ketones, ur: NEGATIVE mg/dL
Nitrite: POSITIVE — AB
Specific Gravity, Urine: 1.02 (ref 1.005–1.030)
pH: 6 (ref 5.0–8.0)

## 2021-05-14 LAB — CBC
HCT: 43.5 % (ref 39.0–52.0)
Hemoglobin: 13.9 g/dL (ref 13.0–17.0)
MCH: 28 pg (ref 26.0–34.0)
MCHC: 32 g/dL (ref 30.0–36.0)
MCV: 87.7 fL (ref 80.0–100.0)
Platelets: 275 10*3/uL (ref 150–400)
RBC: 4.96 MIL/uL (ref 4.22–5.81)
RDW: 14.4 % (ref 11.5–15.5)
WBC: 9.3 10*3/uL (ref 4.0–10.5)
nRBC: 0 % (ref 0.0–0.2)

## 2021-05-14 LAB — BASIC METABOLIC PANEL
Anion gap: 9 (ref 5–15)
BUN: 21 mg/dL (ref 8–23)
CO2: 25 mmol/L (ref 22–32)
Calcium: 9.2 mg/dL (ref 8.9–10.3)
Chloride: 101 mmol/L (ref 98–111)
Creatinine, Ser: 1.12 mg/dL (ref 0.61–1.24)
GFR, Estimated: >60 mL/min (ref >60)
Glucose, Bld: 124 mg/dL — ABNORMAL HIGH (ref 70–99)
Potassium: 4.5 mmol/L (ref 3.5–5.1)
Sodium: 135 mmol/L (ref 135–145)

## 2021-05-14 LAB — URINALYSIS, COMPLETE (UACMP) WITH MICROSCOPIC
Bilirubin Urine: NEGATIVE
Glucose, UA: NEGATIVE mg/dL
Ketones, ur: NEGATIVE mg/dL
Nitrite: POSITIVE — AB
Protein, ur: 30 mg/dL — AB
RBC / HPF: >50 RBC/hpf (ref 0–5)
Specific Gravity, Urine: >1.03 — ABNORMAL HIGH (ref 1.005–1.030)
Squamous Epithelial / HPF: NONE SEEN (ref 0–5)
WBC, UA: >50 WBC/hpf (ref 0–5)
pH: 6.5 (ref 5.0–8.0)

## 2021-05-14 LAB — URINALYSIS, MICROSCOPIC (REFLEX)

## 2021-05-14 LAB — BRAIN NATRIURETIC PEPTIDE: B Natriuretic Peptide: 56.3 pg/mL (ref 0.0–100.0)

## 2021-05-14 MED ORDER — GABAPENTIN 300 MG PO CAPS
300.0000 mg | ORAL_CAPSULE | Freq: Two times a day (BID) | ORAL | Status: DC
  Administered 2021-05-14 – 2021-05-21 (×14): 300 mg via ORAL
  Filled 2021-05-14 (×14): qty 1

## 2021-05-14 MED ORDER — OXYCODONE HCL 5 MG PO TABS
5.0000 mg | ORAL_TABLET | Freq: Once | ORAL | Status: AC
  Administered 2021-05-14: 23:00:00 5 mg via ORAL
  Filled 2021-05-14: qty 1

## 2021-05-14 MED ORDER — ONDANSETRON HCL 4 MG/2ML IJ SOLN
4.0000 mg | Freq: Four times a day (QID) | INTRAMUSCULAR | Status: DC | PRN
  Administered 2021-05-15 – 2021-05-21 (×2): 4 mg via INTRAVENOUS
  Filled 2021-05-14 (×2): qty 2

## 2021-05-14 MED ORDER — LOSARTAN POTASSIUM 25 MG PO TABS
25.0000 mg | ORAL_TABLET | Freq: Every day | ORAL | Status: DC
  Administered 2021-05-16 – 2021-05-21 (×6): 25 mg via ORAL
  Filled 2021-05-14 (×7): qty 1

## 2021-05-14 MED ORDER — MIRABEGRON ER 25 MG PO TB24
25.0000 mg | ORAL_TABLET | Freq: Every day | ORAL | Status: DC
  Administered 2021-05-15 – 2021-05-21 (×7): 25 mg via ORAL
  Filled 2021-05-14 (×7): qty 1

## 2021-05-14 MED ORDER — MAGNESIUM HYDROXIDE 400 MG/5ML PO SUSP: 30.0000 mL | Freq: Every day | ORAL | Status: DC | PRN

## 2021-05-14 MED ORDER — SODIUM CHLORIDE 0.9 % IV SOLN
1.0000 g | Freq: Once | INTRAVENOUS | Status: AC
  Administered 2021-05-14: 23:00:00 1 g via INTRAVENOUS
  Filled 2021-05-14: qty 10

## 2021-05-14 MED ORDER — TIZANIDINE HCL 4 MG PO TABS
4.0000 mg | ORAL_TABLET | Freq: Four times a day (QID) | ORAL | Status: DC | PRN
  Administered 2021-05-17 – 2021-05-18 (×5): 4 mg via ORAL
  Filled 2021-05-14 (×8): qty 1

## 2021-05-14 MED ORDER — ASPIRIN 81 MG PO CHEW
81.0000 mg | CHEWABLE_TABLET | Freq: Every day | ORAL | Status: DC
  Administered 2021-05-15 – 2021-05-21 (×7): 81 mg via ORAL
  Filled 2021-05-14 (×7): qty 1

## 2021-05-14 MED ORDER — PANTOPRAZOLE SODIUM 40 MG PO TBEC
40.0000 mg | DELAYED_RELEASE_TABLET | Freq: Every day | ORAL | Status: DC
  Administered 2021-05-15 – 2021-05-21 (×7): 40 mg via ORAL
  Filled 2021-05-14 (×7): qty 1

## 2021-05-14 MED ORDER — ATORVASTATIN CALCIUM 20 MG PO TABS
40.0000 mg | ORAL_TABLET | Freq: Every day | ORAL | Status: DC
  Administered 2021-05-15 – 2021-05-21 (×7): 40 mg via ORAL
  Filled 2021-05-14 (×7): qty 2

## 2021-05-14 MED ORDER — DONEPEZIL HCL 5 MG PO TABS
5.0000 mg | ORAL_TABLET | Freq: Every day | ORAL | Status: DC
  Administered 2021-05-14 – 2021-05-20 (×7): 5 mg via ORAL
  Filled 2021-05-14 (×8): qty 1

## 2021-05-14 MED ORDER — ACETAMINOPHEN 325 MG PO TABS
650.0000 mg | ORAL_TABLET | Freq: Four times a day (QID) | ORAL | Status: DC | PRN
  Administered 2021-05-15 – 2021-05-21 (×10): 650 mg via ORAL
  Filled 2021-05-14 (×12): qty 2

## 2021-05-14 MED ORDER — FUROSEMIDE 20 MG PO TABS: 20.0000 mg | ORAL_TABLET | Freq: Every day | ORAL | Status: DC | PRN

## 2021-05-14 MED ORDER — ACETAMINOPHEN 650 MG RE SUPP
650.0000 mg | Freq: Four times a day (QID) | RECTAL | Status: DC | PRN
  Filled 2021-05-14: qty 1

## 2021-05-14 MED ORDER — ACETAMINOPHEN 500 MG PO TABS
1000.0000 mg | ORAL_TABLET | Freq: Once | ORAL | Status: AC
  Administered 2021-05-14: 23:00:00 1000 mg via ORAL
  Filled 2021-05-14: qty 2

## 2021-05-14 MED ORDER — ONDANSETRON HCL 4 MG PO TABS: 4.0000 mg | ORAL_TABLET | Freq: Four times a day (QID) | ORAL | Status: DC | PRN

## 2021-05-14 MED ORDER — TAMSULOSIN HCL 0.4 MG PO CAPS
0.4000 mg | ORAL_CAPSULE | Freq: Every day | ORAL | Status: DC
  Administered 2021-05-15 – 2021-05-21 (×7): 0.4 mg via ORAL
  Filled 2021-05-14 (×7): qty 1

## 2021-05-14 MED ORDER — LACTATED RINGERS IV BOLUS
1000.0000 mL | Freq: Once | INTRAVENOUS | Status: AC
  Administered 2021-05-14: 21:00:00 1000 mL via INTRAVENOUS

## 2021-05-14 MED ORDER — TRAZODONE HCL 50 MG PO TABS
25.0000 mg | ORAL_TABLET | Freq: Every evening | ORAL | Status: DC | PRN
  Administered 2021-05-14 – 2021-05-18 (×4): 25 mg via ORAL
  Filled 2021-05-14 (×4): qty 1

## 2021-05-14 MED ORDER — ENOXAPARIN SODIUM 40 MG/0.4ML IJ SOSY
40.0000 mg | PREFILLED_SYRINGE | INTRAMUSCULAR | Status: DC
  Administered 2021-05-15 – 2021-05-21 (×7): 40 mg via SUBCUTANEOUS
  Filled 2021-05-14 (×7): qty 0.4

## 2021-05-14 MED ORDER — SODIUM CHLORIDE 0.9 % IV SOLN: INTRAVENOUS | Status: DC

## 2021-05-14 MED ORDER — VITAMIN B-12 1000 MCG PO TABS
1000.0000 ug | ORAL_TABLET | Freq: Every day | ORAL | Status: DC
  Administered 2021-05-15 – 2021-05-21 (×7): 1000 ug via ORAL
  Filled 2021-05-14 (×7): qty 1

## 2021-05-14 NOTE — ED Triage Notes (Signed)
Pt comes into the ED via EMS from home, was discharged from [facility] on Saturday for rehab from weakness, states he is not able to care for self, daughter is not able to give pt care,   134/87 [id number] 98.1 temp 100HR

## 2021-05-14 NOTE — ED Provider Notes (Signed)
[facility] Emergency Department Provider Note ____________________________________________   Event Date/Time   First MD Initiated Contact with Patient 05/14/21 1926     (approximate)  I have reviewed the triage vital signs and the nursing notes.  HISTORY  Chief Complaint Weakness   HPI Adam Rios. is a 80 y.o. Adam Rios presents to the ED for evaluation of weakness.   Chart review indicates history of stroke, RCC s/p nephrectomy, neurogenic bladder.  Spinal stenosis.  Diastolic dysfunction.  Cognitive decline and dementia. I review medical admission from 1 month ago in the [facility] healthcare system due to intractable back pain and deconditioning and UTI.  Patient presents to the ED for the ration of generalized weakness and inability to walk.  He reports this normally mechanical fall 2 days ago where he face planted on some grass in the front of his house, causing injury to his abdomen and chest, he gestures to his right side as a source of pain.  Denies any other falls, denies syncope or head trauma associated with this event.  He has an indwelling Foley catheter has been present for about 2 weeks.  He denies any fevers or flank pain.  Denies emesis, focal weakness or syncopal episodes. 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   Renal disorder    Stroke ([place])     01/17/18, 12/21   Some weakness and cognitive decline    Patient Active Problem List   Diagnosis Date Noted   Acute encephalopathy 01/30/2018   Chronic kidney disease 04/05/2017   GERD (gastroesophageal reflux disease) 04/05/2017   Pain 04/05/2017   Weakness 04/05/2017   Essential hypertension 07/31/2014    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 04/10/2014   Ventral hernia without obstruction or gangrene 04/10/2014   Acute low back pain without sciatica 12/26/2013   Fusion of spine of lumbar region 12/26/2013   Edema 09/26/2013   Hemorrhoid 09/26/2013   Neurogenic bladder 09/26/2013   Renal cell cancer ([place]) 09/26/2013   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   Lower urinary tract infectious disease 10/28/2012    Past Surgical History:  Procedure Laterality Date   APPENDECTOMY     CATARACT EXTRACTION W/PHACO Right 07/22/2020   Procedure: CATARACT EXTRACTION PHACO AND INTRAOCULAR LENS PLACEMENT ([place]) RIGHT;  Surgeon: Eulogio Bear, MD;  Location: [place];  Service: Ophthalmology;  Laterality: Right;  4.77 0:44.2   CATARACT EXTRACTION W/PHACO Left 08/05/2020   Procedure: CATARACT EXTRACTION PHACO AND INTRAOCULAR LENS PLACEMENT ([facility]) LEFT 10.73 01:13.8;  Surgeon: Eulogio Bear, MD;  Location: [place];  Service: Ophthalmology;  Laterality: Left;  Use eye stretcher not chair   HERNIA REPAIR     LUMBAR FUSION     NEPHRECTOMY Left 2005    Prior to Admission medications   Medication Sig Start Date End Date Taking? Authorizing Provider  acetaminophen (TYLENOL) 325 MG tablet Take 500 mg by mouth every 6 (six) hours as needed. 08/14/14   [provider]  ASPIRIN 81 PO Take by mouth daily.    [provider]  atorvastatin (LIPITOR) 40 MG tablet Take 40 mg by mouth daily.    [provider]  donepezil (  ARICEPT) 5 MG tablet Take 5 mg by mouth at bedtime.    [provider]  furosemide (LASIX) 20 MG tablet Take 20 mg by mouth daily as needed.    [provider]  gabapentin (NEURONTIN) 300 MG capsule Take 300 mg by mouth 2 (two) times daily.    [provider]  losartan (COZAAR) 25 MG tablet Take 25 mg by mouth daily. 03/27/17 08/05/20  [provider]  mirabegron ER (MYRBETRIQ) 25 MG TB24 tablet Take 25 mg by mouth daily.     [provider]  nitrofurantoin (MACRODANTIN) 100 MG capsule Take 100 mg by mouth daily.    [provider]  omeprazole (PRILOSEC) 20 MG capsule Take 20 mg by mouth daily.    [provider]  tamsulosin (FLOMAX) 0.4 MG CAPS capsule Take 0.4 mg by mouth daily.    [provider]  tiZANidine (ZANAFLEX) 4 MG tablet Take 4 mg by mouth every 6 (six) hours as needed for muscle spasms.    [provider]  vitamin B-12 (CYANOCOBALAMIN) 1000 MCG tablet Take 1,000 mcg by mouth daily.    [provider]    Allergies Fentanyl  No family history on file.  Social History Social History   Tobacco Use   Smoking status: Former    Packs/day: 0.25    Years: 10.00    Pack years: 2.50    Types: Cigarettes    Quit date: 2005    Years since quitting: 17.9   Smokeless tobacco: Never  Vaping Use   Vaping Use: Never used  Substance Use Topics   Alcohol use: Not Currently   Drug use: Never    Review of Systems  Constitutional: No fever/chills Eyes: No visual changes. ENT: No sore throat. Cardiovascular: Denies chest pain. Respiratory: Denies shortness of breath. Gastrointestinal: No abdominal pain.  No nausea, no vomiting.  No diarrhea.  No constipation. Genitourinary: Negative for dysuria. Musculoskeletal: Negative for back pain. Fall and right-sided chest/abdominal pain. Skin: Negative for rash. Neurological: Negative for headaches, focal weakness or numbness. ___________________________________________   PHYSICAL EXAM:  VITAL SIGNS: Vitals:   05/14/21 2058 05/14/21 2200  BP: 138/77 134/80  Pulse: (!) 114 91  Resp: 20   Temp:    SpO2: 97% 94%    Constitutional: Alert and oriented. Well appearing and in no acute distress. Eyes: Conjunctivae are normal. PERRL. EOMI. Head: Atraumatic. Nose: No congestion/rhinnorhea. Mouth/Throat: Mucous membranes are moist.  Oropharynx non-erythematous. Neck: No stridor. No cervical spine  tenderness to palpation. Cardiovascular: Tachycardic rate, regular rhythm. Grossly normal heart sounds.  Good peripheral circulation. Respiratory: Normal respiratory effort.  No retractions. Lungs CTAB. Gastrointestinal: Soft , . No CVA tenderness. Minimal distention.  Diffusely tender without peritoneal features or localizing features. Musculoskeletal: No lower extremity tenderness nor edema.  No joint effusions. No signs of acute trauma. Tender to palpation to his right lower rib cage.  No overlying skin changes or signs of trauma. Neurologic:  Normal speech and language. No gross focal neurologic deficits are appreciated. No gait instability noted. Skin:  Skin is warm, dry and intact. No rash noted. Psychiatric: Mood and affect are normal. Speech and behavior are normal. ____________________________________________   LABS (all labs ordered are listed, but only abnormal results are displayed)  Labs Reviewed  BASIC METABOLIC PANEL - Abnormal; Notable for the following components:      Result Value   Glucose, Bld 124 (*)    All other components within normal limits  URINALYSIS, ROUTINE W  REFLEX MICROSCOPIC - Abnormal; Notable for the following components:   Hgb urine dipstick TRACE (*)    Protein, ur TRACE (*)    Nitrite POSITIVE (*)    Leukocytes,Ua SMALL (*)    All other components within normal limits  URINALYSIS, MICROSCOPIC (REFLEX) - Abnormal; Notable for the following components:   Bacteria, UA MANY (*)    All other components within normal limits  URINALYSIS, COMPLETE (UACMP) WITH MICROSCOPIC - Abnormal; Notable for the following components:   APPearance TURBID (*)    Specific Gravity, Urine >1.030 (*)    Hgb urine dipstick LARGE (*)    Protein, ur 30 (*)    Nitrite POSITIVE (*)    Leukocytes,Ua LARGE (*)    All other components within normal limits  RESP PANEL BY RT-PCR (FLU A&B, COVID) [id number]  URINE CULTURE  CBC  BRAIN NATRIURETIC PEPTIDE  CBG MONITORING, ED    ____________________________________________  12 Lead EKG  Sinus rhythm, rate of 101 bpm.  Leftward axis.  Normal intervals.  No evidence of acute ischemia. ____________________________________________  RADIOLOGY  ED MD interpretation: CT abdomen reviewed by me with constipation and right lower rib fractures. CXR reviewed by me without evidence of acute cardiopulmonary pathology.   Official radiology report(s): CT ABDOMEN PELVIS WO CONTRAST  Result Date: 05/14/2021 CLINICAL DATA:  [place] last weekend, abdominal pain, constipation EXAM: CT ABDOMEN AND PELVIS WITHOUT CONTRAST TECHNIQUE: Multidetector CT imaging of the abdomen and pelvis was performed following the standard protocol without IV contrast. Unenhanced CT was performed per clinician order. Lack of IV contrast limits sensitivity and specificity, especially for evaluation of abdominal/pelvic solid viscera. COMPARISON:  01/13/2010 FINDINGS: Lower chest: Hypoventilatory changes are seen at the lung bases. Hepatobiliary: 3.7 cm left lobe liver cyst identified. Otherwise unremarkable unenhanced appearance of the liver and gallbladder. 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 Pancreas: Unremarkable unenhanced appearance. Spleen: Unremarkable unenhanced appearance. Adrenals/Urinary Tract: Left nephrectomy. No right-sided renal calculi or obstructive uropathy. Bladder is decompressed by Foley catheter. The adrenals are unremarkable. Stomach/Bowel: No bowel obstruction or ileus. There is a large amount of retained stool throughout the colon consistent with constipation. No bowel wall thickening or inflammatory change. The appendix appears surgically absent. Vascular/Lymphatic: Aortic atherosclerosis. No enlarged abdominal or pelvic lymph nodes. Reproductive: Marked enlargement of the prostate, measuring 8.1 x 7.5 cm. Other: No free intraperitoneal fluid or free gas. No abdominal wall hernia.  Musculoskeletal: There are displaced right lateral eighth through tenth rib fractures, compatible with history of recent trauma. No other acute bony abnormalities. Reconstructed images demonstrate no additional findings. IMPRESSION: 1. Right lateral eighth through tenth rib fractures, consistent with recent trauma. 2. No other acute intra-abdominal or intrapelvic trauma on this exam limited by the lack of IV contrast. 3. Marked fecal retention consistent with constipation. 4. Marked enlargement of the prostate. 5. Eventration of the right hemidiaphragm. 6.  Aortic Atherosclerosis (ICD10-I70.0). Electronically Signed   By: Randa Ngo M.D.   On: 05/14/2021 21:30   DG Chest 2 View  Result Date: 05/14/2021 CLINICAL DATA:  Weakness, fall EXAM: CHEST - 2 VIEW COMPARISON:  01/29/2018 FINDINGS: Low lung volumes. Eventration of the right diaphragm. No focal consolidation. No pleural effusion or pneumothorax. The heart is normal in size.  Thoracic aortic atherosclerosis. Calcified mediastinal nodes, chronic. Visualized osseous structures are within normal limits. IMPRESSION: No evidence of acute cardiopulmonary disease. Electronically Signed   By: Julian Hy M.D.   On: 05/14/2021 19:35   CT  HEAD WO CONTRAST  Result Date: 05/14/2021 CLINICAL DATA:  fall EXAM: CT HEAD WITHOUT CONTRAST CT CERVICAL SPINE WITHOUT CONTRAST TECHNIQUE: Multidetector CT imaging of the head and cervical spine was performed following the standard protocol without intravenous contrast. Multiplanar CT image reconstructions of the cervical spine were also generated. COMPARISON:  CT head 01/29/2017. FINDINGS: CT HEAD FINDINGS Brain: Hypodensity in the right occipital lobe, likely encephalomalacia/gliosis from the hemorrhage seen on prior CT from 2018. No evidence of acute large vascular territory infarct, acute hemorrhage, mass lesion, or midline shift. Generalized atrophy. Redemonstrated ventriculomegaly which is mildly out of  proportion to the degree of sulcal volume loss. There is also a somewhat acute callosum angle and some crowding of sulci at the vertex. Vascular: Calcific intracranial atherosclerosis. No hyperdense vessel identified. Skull: No acute fracture. Sinuses/Orbits: Visualized sinuses are clear. Unremarkable visualized orbits. Other: No mastoid effusions. CT CERVICAL SPINE FINDINGS Alignment: No substantial sagittal subluxation. Skull base and vertebrae: Vertebral body heights are maintained. No evidence of acute fracture. Soft tissues and spinal canal: No prevertebral fluid or swelling. No visible canal hematoma. Disc levels: Multilevel facet/uncovertebral hypertrophy with varying degrees of neural foraminal stenosis. Mild multilevel degenerative disc disease. Upper chest: Visualized lung apices are clear on motion limited assessment. Other: Calcific atherosclerosis at bilateral carotid bifurcations. IMPRESSION: CT head: 1. No evidence of acute intracranial abnormality. 2. 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 3. Redemonstrated ventriculomegaly, which is slightly out of proportion to the degree of sulcal dilation. This could be due to central predominant volume loss; however, there are some findings that can be seen with normal pressure hydrocephalus (NPH). Recommend correlation with the presence or absence of signs/symptoms of NPH. CT cervical spine: No evidence of acute fracture or traumatic malalignment. Electronically Signed   By: Margaretha Sheffield M.D.   On: 05/14/2021 19:15   CT Cervical Spine Wo Contrast  Result Date: 05/14/2021 CLINICAL DATA:  fall EXAM: CT HEAD WITHOUT CONTRAST CT CERVICAL SPINE WITHOUT CONTRAST TECHNIQUE: Multidetector CT imaging of the head and cervical spine was performed following the standard protocol without intravenous contrast. Multiplanar CT image reconstructions of the cervical spine were also generated. COMPARISON:  CT head 01/29/2017. FINDINGS: CT HEAD  FINDINGS Brain: Hypodensity in the right occipital lobe, likely encephalomalacia/gliosis from the hemorrhage seen on prior CT from 2018. No evidence of acute large vascular territory infarct, acute hemorrhage, mass lesion, or midline shift. Generalized atrophy. Redemonstrated ventriculomegaly which is mildly out of proportion to the degree of sulcal volume loss. There is also a somewhat acute callosum angle and some crowding of sulci at the vertex. Vascular: Calcific intracranial atherosclerosis. No hyperdense vessel identified. Skull: No acute fracture. Sinuses/Orbits: Visualized sinuses are clear. Unremarkable visualized orbits. Other: No mastoid effusions. CT CERVICAL SPINE FINDINGS Alignment: No substantial sagittal subluxation. Skull base and vertebrae: Vertebral body heights are maintained. No evidence of acute fracture. Soft tissues and spinal canal: No prevertebral fluid or swelling. No visible canal hematoma. Disc levels: Multilevel facet/uncovertebral hypertrophy with varying degrees of neural foraminal stenosis. Mild multilevel degenerative disc disease. Upper chest: Visualized lung apices are clear on motion limited assessment. Other: Calcific atherosclerosis at bilateral carotid bifurcations. IMPRESSION: CT head: 1. No evidence of acute intracranial abnormality. 2. 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 3. Redemonstrated ventriculomegaly, which is slightly out of proportion to the degree of sulcal dilation. This could be due to central predominant volume loss; however, there are some findings that can be seen with normal pressure hydrocephalus (NPH). Recommend correlation with the presence or  absence of signs/symptoms of NPH. CT cervical spine: No evidence of acute fracture or traumatic malalignment. Electronically Signed   By: Margaretha Sheffield M.D.   On: 05/14/2021 19:15    ____________________________________________   PROCEDURES and INTERVENTIONS  Procedure(s) performed  (including Critical Care):  .1-3 Lead EKG Interpretation Performed by: Vladimir Crofts, MD Authorized by: Vladimir Crofts, MD     Interpretation: abnormal     ECG rate:  112   ECG rate assessment: tachycardic     Rhythm: sinus tachycardia     Ectopy: none     Conduction: normal    Medications  cefTRIAXone (ROCEPHIN) 1 g in sodium chloride 0.9 % 100 mL IVPB (has no administration in time range)  lactated ringers bolus 1,000 mL (0 mLs Intravenous Stopped 05/14/21 2238)  acetaminophen (TYLENOL) tablet 1,000 mg (1,000 mg Oral Given 05/14/21 2232)  oxyCODONE (Oxy IR/ROXICODONE) immediate release tablet 5 mg (5 mg Oral Given 05/14/21 2232)    ____________________________________________   MDM / ED COURSE   80 year old male presents to the ED with generalized weakness and right-sided pain after a fall that occurred 2 days ago, found of evidence of acute cystitis and rib fractures requiring medical admission.  Tachycardic, likely due to pain, improved with analgesia and vitals otherwise normal on room air.  Exam with mild tenderness to the right side.  Abdomen is slightly distended and has some mild tenderness throughout without localizing or peritoneal features.  He has no signs of neurologic vascular deficits, just generalized weakness.  Blood work is benign.  CXR without infiltrates, PTX or rib fractures and CT head/neck is reassuring without evidence of ICH or CVA.  His Foley catheter is replaced and sent for a fresh urine sample concerning for acute cystitis.  We will get him started on antibiotics and discussed with medicine for admission.  Unfortunately a urinalysis with reflex microscope was sent from his old indwelling Foley catheter. The urinalysis with complete microscope, not reflex, is the urine from his fresh Foley.   Clinical Course as of 05/14/21 2305  Wed May 14, 2021  [place], daughter, no response. Left VM [DS]    Clinical Course User Index [DS] Vladimir Crofts, MD     ____________________________________________   FINAL CLINICAL IMPRESSION(S) / ED DIAGNOSES  Final diagnoses:  Generalized weakness  Closed fracture of multiple ribs of right side, initial encounter  Acute cystitis without hematuria     ED Discharge Orders     None        Reneshia Zuccaro   Note:  This document was prepared using Dragon voice recognition software and may include unintentional dictation errors.    Vladimir Crofts, MD 05/14/21 [phone]

## 2021-05-14 NOTE — ED Triage Notes (Signed)
Pt comes via EMS with c/o weakness. Pt states he fell this weekend. Pt states he can't get his strength back. Pt states he fell off the porch and landed in the dirt on his chest. Pt unsure if he hit his head. Pt denies any LOC.  Pt states pain in chest and stomach.

## 2021-05-14 NOTE — ED Notes (Signed)
Seen pt in room  at this time, AAOx4, respi even-unlabored.  In NAD.

## 2021-05-14 NOTE — H&P (Addendum)
[place]   PATIENT NAME: Adam Rios    MR#:  [id number]  DATE OF BIRTH:  September 12, 1940  DATE OF ADMISSION:  05/14/2021  PRIMARY CARE PHYSICIAN: Dola Argyle, MD   Patient is coming from: Home  REQUESTING/REFERRING PHYSICIAN: Vladimir Crofts, MD  CHIEF COMPLAINT:   Chief Complaint  Patient presents with   Weakness    HISTORY OF PRESENT ILLNESS:  Adam Herd. is a 80 y.o. Caucasian male with medical history significant for essential hypertension, CVA, neurogenic bladder and renal cell carcinoma status post nephrectomy, cognitive dysfunction, spinal stenosis and diastolic dysfunction, who presented to the ER with acute onset of generalized weakness and inability to walk.  He was admitted at [facility] but a month ago due to intractable back pain and deconditioning with UTI.  He had a mechanical accidental fall 2 days ago on the grass in front of his house.  He had subsequent injury to his abdomen and chest and subsequent right-sided lower chest pain.  No presyncope or syncope, paresthesias or focal muscle weakness or head injury.  He has an indwelling Foley catheter which has been there for the last couple weeks.  No fever or chills.  He got out of rehabilitation and has been with his daughter and son-in-law for the last few days.  The patient's Foley catheter was replaced in the ER.  ED Course: When he came to the ER, vital signs were within normal.  Labs revealed normal CBC.  BNP was 56.3 and BMP was within normal.  UA was positive for UTI. EKG as reviewed by me : EKG showed sinus tachycardia with rate 1 1 with premature atrial complexes, left axis deviation, low voltage QRS and inferior Q waves as well as anterolateral Q waves. Imaging: Head CT without contrast revealed no evidence for acute intracranial normality.  It showed encephalomalacia/gliosis from prior occipital hemorrhage.  It redemonstrated ventriculomegaly that is slightly out of proportion to the degree of  sulcal dilatation and it could be due to central predominant volume loss however there are some findings that can be seen with normal pressure hydrocephalus.  C-spine CT showed no fracture or traumatic malalignment.  Two-view chest x-ray showed no acute cardiopulmonary disease. Abdominal pelvic CT scan revealed the following: 1. Right lateral eighth through tenth rib fractures, consistent with recent trauma. 2. No other acute intra-abdominal or intrapelvic trauma on this exam limited by the lack of IV contrast. 3. Marked fecal retention consistent with constipation. 4. Marked enlargement of the prostate. 5. Eventration of the right hemidiaphragm. 6.  Aortic Atherosclerosis  The patient was given a gram of IV Rocephin, 1 L bolus of IV lactated Ringer, 5 mg of p.o. oxycodone and 1 g of p.o. Tylenol.  He will be admitted to a medical bed for further evaluation and management PAST MEDICAL HISTORY:   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[place])    Hypertension    Neurogenic bladder    Renal cell carcinoma ([place]) 2005   Renal disorder    Stroke ([place])     01/17/18, 12/21   Some weakness and cognitive decline    PAST SURGICAL HISTORY:   Past Surgical History:  Procedure Laterality Date   APPENDECTOMY     CATARACT EXTRACTION W/PHACO Right 07/22/2020   Procedure: CATARACT EXTRACTION PHACO AND INTRAOCULAR LENS PLACEMENT ([place]) RIGHT;  Surgeon:  Eulogio Bear, MD;  Location: [facility];  Service: Ophthalmology;  Laterality: Right;  4.77 0:44.2   CATARACT EXTRACTION W/PHACO Left 08/05/2020   Procedure: CATARACT EXTRACTION PHACO AND INTRAOCULAR LENS PLACEMENT ([facility]) LEFT 10.73 01:13.8;  Surgeon: Eulogio Bear, MD;  Location: [facility];  Service: Ophthalmology;  Laterality: Left;  Use eye stretcher not chair   HERNIA REPAIR     LUMBAR  FUSION     NEPHRECTOMY Left 2005    SOCIAL HISTORY:   Social History   Tobacco Use   Smoking status: Former    Packs/day: 0.25    Years: 10.00    Pack years: 2.50    Types: Cigarettes    Quit date: 2005    Years since quitting: 17.9   Smokeless tobacco: Never  Substance Use Topics   Alcohol use: Not Currently    FAMILY HISTORY:  No family history on file.  He denied any familial diseases.  DRUG ALLERGIES:   Allergies  Allergen Reactions   Fentanyl Nausea Only    REVIEW OF SYSTEMS:   ROS As per history of present illness. All pertinent systems were reviewed above. Constitutional, HEENT, cardiovascular, respiratory, GI, GU, musculoskeletal, neuro, psychiatric, endocrine, integumentary and hematologic systems were reviewed and are otherwise negative/unremarkable except for positive findings mentioned above in the HPI.   MEDICATIONS AT HOME:   Prior to Admission medications   Medication Sig Start Date End Date Taking? Authorizing Provider  acetaminophen (TYLENOL) 325 MG tablet Take 500 mg by mouth every 6 (six) hours as needed. 08/14/14   [provider]  ASPIRIN 81 PO Take by mouth daily.    [provider]  atorvastatin (LIPITOR) 40 MG tablet Take 40 mg by mouth daily.    [provider]  donepezil (ARICEPT) 5 MG tablet Take 5 mg by mouth at bedtime.    [provider]  furosemide (LASIX) 20 MG tablet Take 20 mg by mouth daily as needed.    [provider]  gabapentin (NEURONTIN) 300 MG capsule Take 300 mg by mouth 2 (two) times daily.    [provider]  losartan (COZAAR) 25 MG tablet Take 25 mg by mouth daily. 03/27/17 08/05/20  [provider]  mirabegron ER (MYRBETRIQ) 25 MG TB24 tablet Take 25 mg by mouth daily.    [provider]  nitrofurantoin (MACRODANTIN) 100 MG capsule Take 100 mg by mouth daily.    [provider]  omeprazole (PRILOSEC) 20 MG capsule Take 20 mg by mouth  daily.    [provider]  tamsulosin (FLOMAX) 0.4 MG CAPS capsule Take 0.4 mg by mouth daily.    [provider]  tiZANidine (ZANAFLEX) 4 MG tablet Take 4 mg by mouth every 6 (six) hours as needed for muscle spasms.    [provider]  vitamin B-12 (CYANOCOBALAMIN) 1000 MCG tablet Take 1,000 mcg by mouth daily.    [provider]      VITAL SIGNS:  Blood pressure (!) 146/87, pulse 88, temperature 98.1 F (36.7 C), temperature source Oral, resp. rate 17, SpO2 94 %.  PHYSICAL EXAMINATION:  Physical Exam  GENERAL:  80 y.o.-year-old Caucasian male patient lying in the bed with no acute distress.  EYES: Pupils equal, round, reactive to light and accommodation. No scleral icterus. Extraocular muscles intact.  HEENT: Head atraumatic, normocephalic. Oropharynx and nasopharynx clear.  NECK:  Supple, no jugular venous distention. No thyroid enlargement, no tenderness.  LUNGS: Normal breath sounds bilaterally, no wheezing,  rales,rhonchi or crepitation. No use of accessory muscles of respiration.  CARDIOVASCULAR: Regular rate and rhythm, S1, S2 normal. No murmurs, rubs, or gallops.  ABDOMEN: Soft, nondistended, nontender. Bowel sounds present. No organomegaly or mass.  EXTREMITIES: No pedal edema, cyanosis, or clubbing.  NEUROLOGIC: Cranial nerves II through XII are intact. Muscle strength 5/5 in all extremities. Sensation intact. Gait not checked.  PSYCHIATRIC: The patient is alert and oriented x 3.  Normal affect and good eye contact. SKIN: No obvious rash, lesion, or ulcer.   LABORATORY PANEL:   CBC Recent Labs  Lab 05/14/21 1701  WBC 9.3  HGB 13.9  HCT 43.5  PLT 275   ------------------------------------------------------------------------------------------------------------------  Chemistries  Recent Labs  Lab 05/14/21 1701  NA 135  K 4.5  CL 101  CO2 25  GLUCOSE 124*  BUN 21  CREATININE 1.12  CALCIUM 9.2    ------------------------------------------------------------------------------------------------------------------  Cardiac Enzymes No results for input(s): TROPONINI in the last 168 hours. ------------------------------------------------------------------------------------------------------------------  RADIOLOGY:  CT ABDOMEN PELVIS WO CONTRAST  Result Date: 05/14/2021 CLINICAL DATA:  [place] last weekend, abdominal pain, constipation EXAM: CT ABDOMEN AND PELVIS WITHOUT CONTRAST TECHNIQUE: Multidetector CT imaging of the abdomen and pelvis was performed following the standard protocol without IV contrast. Unenhanced CT was performed per clinician order. Lack of IV contrast limits sensitivity and specificity, especially for evaluation of abdominal/pelvic solid viscera. COMPARISON:  01/13/2010 FINDINGS: Lower chest: Hypoventilatory changes are seen at the lung bases. Hepatobiliary: 3.7 cm left lobe liver cyst identified. Otherwise unremarkable unenhanced appearance of the liver and gallbladder. 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 Pancreas: Unremarkable unenhanced appearance. Spleen: Unremarkable unenhanced appearance. Adrenals/Urinary Tract: Left nephrectomy. No right-sided renal calculi or obstructive uropathy. Bladder is decompressed by Foley catheter. The adrenals are unremarkable. Stomach/Bowel: No bowel obstruction or ileus. There is a large amount of retained stool throughout the colon consistent with constipation. No bowel wall thickening or inflammatory change. The appendix appears surgically absent. Vascular/Lymphatic: Aortic atherosclerosis. No enlarged abdominal or pelvic lymph nodes. Reproductive: Marked enlargement of the prostate, measuring 8.1 x 7.5 cm. Other: No free intraperitoneal fluid or free gas. No abdominal wall hernia. Musculoskeletal: There are displaced right lateral eighth through tenth rib fractures, compatible with history of  recent trauma. No other acute bony abnormalities. Reconstructed images demonstrate no additional findings. IMPRESSION: 1. Right lateral eighth through tenth rib fractures, consistent with recent trauma. 2. No other acute intra-abdominal or intrapelvic trauma on this exam limited by the lack of IV contrast. 3. Marked fecal retention consistent with constipation. 4. Marked enlargement of the prostate. 5. Eventration of the right hemidiaphragm. 6.  Aortic Atherosclerosis (ICD10-I70.0). Electronically Signed   By: Randa Ngo M.D.   On: 05/14/2021 21:30   DG Chest 2 View  Result Date: 05/14/2021 CLINICAL DATA:  Weakness, fall EXAM: CHEST - 2 VIEW COMPARISON:  01/29/2018 FINDINGS: Low lung volumes. Eventration of the right diaphragm. No focal consolidation. No pleural effusion or pneumothorax. The heart is normal in size.  Thoracic aortic atherosclerosis. Calcified mediastinal nodes, chronic. Visualized osseous structures are within normal limits. IMPRESSION: No evidence of acute cardiopulmonary disease. Electronically Signed   By: Julian Hy M.D.   On: 05/14/2021 19:35   CT HEAD WO CONTRAST  Result Date: 05/14/2021 CLINICAL DATA:  fall EXAM: CT HEAD WITHOUT CONTRAST CT CERVICAL SPINE WITHOUT CONTRAST TECHNIQUE: Multidetector CT imaging of the head and cervical spine was performed following the standard protocol without intravenous contrast. Multiplanar CT image reconstructions of  the cervical spine were also generated. COMPARISON:  CT head 01/29/2017. FINDINGS: CT HEAD FINDINGS Brain: Hypodensity in the right occipital lobe, likely encephalomalacia/gliosis from the hemorrhage seen on prior CT from 2018. No evidence of acute large vascular territory infarct, acute hemorrhage, mass lesion, or midline shift. Generalized atrophy. Redemonstrated ventriculomegaly which is mildly out of proportion to the degree of sulcal volume loss. There is also a somewhat acute callosum angle and some crowding of sulci  at the vertex. Vascular: Calcific intracranial atherosclerosis. No hyperdense vessel identified. Skull: No acute fracture. Sinuses/Orbits: Visualized sinuses are clear. Unremarkable visualized orbits. Other: No mastoid effusions. CT CERVICAL SPINE FINDINGS Alignment: No substantial sagittal subluxation. Skull base and vertebrae: Vertebral body heights are maintained. No evidence of acute fracture. Soft tissues and spinal canal: No prevertebral fluid or swelling. No visible canal hematoma. Disc levels: Multilevel facet/uncovertebral hypertrophy with varying degrees of neural foraminal stenosis. Mild multilevel degenerative disc disease. Upper chest: Visualized lung apices are clear on motion limited assessment. Other: Calcific atherosclerosis at bilateral carotid bifurcations. IMPRESSION: CT head: 1. No evidence of acute intracranial abnormality. 2. 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 3. Redemonstrated ventriculomegaly, which is slightly out of proportion to the degree of sulcal dilation. This could be due to central predominant volume loss; however, there are some findings that can be seen with normal pressure hydrocephalus (NPH). Recommend correlation with the presence or absence of signs/symptoms of NPH. CT cervical spine: No evidence of acute fracture or traumatic malalignment. Electronically Signed   By: Margaretha Sheffield M.D.   On: 05/14/2021 19:15   CT Cervical Spine Wo Contrast  Result Date: 05/14/2021 CLINICAL DATA:  fall EXAM: CT HEAD WITHOUT CONTRAST CT CERVICAL SPINE WITHOUT CONTRAST TECHNIQUE: Multidetector CT imaging of the head and cervical spine was performed following the standard protocol without intravenous contrast. Multiplanar CT image reconstructions of the cervical spine were also generated. COMPARISON:  CT head 01/29/2017. FINDINGS: CT HEAD FINDINGS Brain: Hypodensity in the right occipital lobe, likely encephalomalacia/gliosis from the hemorrhage seen on prior CT  from 2018. No evidence of acute large vascular territory infarct, acute hemorrhage, mass lesion, or midline shift. Generalized atrophy. Redemonstrated ventriculomegaly which is mildly out of proportion to the degree of sulcal volume loss. There is also a somewhat acute callosum angle and some crowding of sulci at the vertex. Vascular: Calcific intracranial atherosclerosis. No hyperdense vessel identified. Skull: No acute fracture. Sinuses/Orbits: Visualized sinuses are clear. Unremarkable visualized orbits. Other: No mastoid effusions. CT CERVICAL SPINE FINDINGS Alignment: No substantial sagittal subluxation. Skull base and vertebrae: Vertebral body heights are maintained. No evidence of acute fracture. Soft tissues and spinal canal: No prevertebral fluid or swelling. No visible canal hematoma. Disc levels: Multilevel facet/uncovertebral hypertrophy with varying degrees of neural foraminal stenosis. Mild multilevel degenerative disc disease. Upper chest: Visualized lung apices are clear on motion limited assessment. Other: Calcific atherosclerosis at bilateral carotid bifurcations. IMPRESSION: CT head: 1. No evidence of acute intracranial abnormality. 2. 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 3. Redemonstrated ventriculomegaly, which is slightly out of proportion to the degree of sulcal dilation. This could be due to central predominant volume loss; however, there are some findings that can be seen with normal pressure hydrocephalus (NPH). Recommend correlation with the presence or absence of signs/symptoms of NPH. CT cervical spine: No evidence of acute fracture or traumatic malalignment. Electronically Signed   By: Margaretha Sheffield M.D.   On: 05/14/2021 19:15      IMPRESSION AND PLAN:  Principal Problem:   Fall  1.  Generalized weakness like secondary to UTI.  The patient high a subsequent fall that led to right eighth through 10th rib fractures. - The patient will be admitted to a  medical bed. - We will continue the patient on IV Rocephin and follow urine culture. - PT evaluation will be obtained. - He will be on fall precautions. - Pain management will be provided.  2.  Essential hypertension. - We will continue Norvasc and Cozaar.  3.  Dyslipidemia. - We will continue statin therapy.  4.  BPH. - We will continue Flomax.  5.  History of recurrent UTI. - I am holding Macrodantin for now since he will be on IV Rocephin  DVT prophylaxis: Lovenox. Code Status: I discussed CODE STATUS with the patient and he wants to be full code.  Family Communication:  The plan of care was discussed in details with the patient (and family). I answered all questions. The patient agreed to proceed with the above mentioned plan. Further management will depend upon hospital course. Disposition Plan: Back to previous home environment Consults called: none.  All the records are reviewed and case discussed with ED provider.  Status is: Inpatient  Remains inpatient appropriate because:Ongoing diagnostic testing needed not appropriate for outpatient work up, Unsafe d/c plan, IV treatments appropriate due to intensity of illness or inability to take PO, and Inpatient level of care appropriate due to severity of illness   Dispo: The patient is from: Home              Anticipated d/c is to: Home              Patient currently is not medically stable to d/c.              Difficult to place patient: No     Christel Mormon M.D on 05/14/2021 at 11:21 PM  [facility]   From 7 PM-7 AM, contact night-coverage [email or web address]  CC: Primary care physician; Dola Argyle, MD

## 2021-05-15 ENCOUNTER — Encounter: Payer: Self-pay | Admitting: Family Medicine

## 2021-05-15 DIAGNOSIS — S2241XA Multiple fractures of ribs, right side, initial encounter for closed fracture: Secondary | ICD-10-CM

## 2021-05-15 DIAGNOSIS — E7849 Other hyperlipidemia: Secondary | ICD-10-CM | POA: Diagnosis not present

## 2021-05-15 DIAGNOSIS — T83518A Infection and inflammatory reaction due to other urinary catheter, initial encounter: Secondary | ICD-10-CM | POA: Diagnosis not present

## 2021-05-15 DIAGNOSIS — N390 Urinary tract infection, site not specified: Secondary | ICD-10-CM

## 2021-05-15 LAB — RESP PANEL BY RT-PCR (FLU A&B, COVID) ARPGX2
Influenza A by PCR: NEGATIVE
Influenza B by PCR: NEGATIVE
SARS Coronavirus 2 by RT PCR: NEGATIVE

## 2021-05-15 LAB — URINE CULTURE

## 2021-05-15 LAB — CBC
HCT: 38.6 % — ABNORMAL LOW (ref 39.0–52.0)
Hemoglobin: 12.4 g/dL — ABNORMAL LOW (ref 13.0–17.0)
MCH: 27.5 pg (ref 26.0–34.0)
MCHC: 32.1 g/dL (ref 30.0–36.0)
MCV: 85.6 fL (ref 80.0–100.0)
Platelets: 267 10*3/uL (ref 150–400)
RBC: 4.51 MIL/uL (ref 4.22–5.81)
RDW: 14.5 % (ref 11.5–15.5)
WBC: 8.9 10*3/uL (ref 4.0–10.5)
nRBC: 0 % (ref 0.0–0.2)

## 2021-05-15 LAB — BASIC METABOLIC PANEL
Anion gap: 7 (ref 5–15)
BUN: 18 mg/dL (ref 8–23)
CO2: 26 mmol/L (ref 22–32)
Calcium: 8.8 mg/dL — ABNORMAL LOW (ref 8.9–10.3)
Chloride: 102 mmol/L (ref 98–111)
Creatinine, Ser: 0.93 mg/dL (ref 0.61–1.24)
GFR, Estimated: >60 mL/min (ref >60)
Glucose, Bld: 123 mg/dL — ABNORMAL HIGH (ref 70–99)
Potassium: 4.1 mmol/L (ref 3.5–5.1)
Sodium: 135 mmol/L (ref 135–145)

## 2021-05-15 MED ORDER — SODIUM CHLORIDE 0.9 % IV SOLN
2.0000 g | INTRAVENOUS | Status: DC
  Administered 2021-05-15 – 2021-05-18 (×4): 2 g via INTRAVENOUS
  Filled 2021-05-15: qty 20
  Filled 2021-05-15: qty 2
  Filled 2021-05-15: qty 20
  Filled 2021-05-15: qty 2

## 2021-05-15 MED ORDER — CHLORHEXIDINE GLUCONATE CLOTH 2 % EX PADS
6.0000 | MEDICATED_PAD | Freq: Every day | CUTANEOUS | Status: DC
  Administered 2021-05-15 – 2021-05-21 (×7): 6 via TOPICAL

## 2021-05-15 NOTE — Progress Notes (Signed)
PT Cancellation Note  Patient Details Name: Fillmore Bynum. MRN: [id number] DOB: 01-20-1941   Cancelled Treatment:    Reason Eval/Treat Not Completed: Pain limiting ability to participate. Orders received and chart reviewed. Spent 20 minutes with patient on pertinent history, PLOF, living environment etc. Upon attempts to mobilize pt declining attempts due to pain and fear of falling. Requesting rehab to return in "a day or two" Pt heavily encouraged with use of 2 person assist for pt comfort and safety in mobility with further decline. Pt educated on mobility attempts to justify d/c recommendations and that it is normal and will be expected to have pain with mobility and that will likely not change in the coming days. Pt educated on log roll and further attempts made with pt to participate with bed mobility technique but pt continuing to decline. RN stated pt had no difficulty with sitting EoB and had no reports of rib pain but did require 3 person assist to return to supine in bed. PT will continue to attempt. Anticipate based off of RN assessment pt may need STR.   Salem Caster. Fairly IV, PT, DPT Physical Therapist- [facility]  05/15/2021, 9:21 AM

## 2021-05-15 NOTE — TOC Initial Note (Signed)
Transition of Care (TOC) - Initial/Assessment Note    Patient Details  Name: Adam Rios. MRN: [id number] Date of Birth: [birth year]  Transition of Care [facility]) CM/SW Contact:    Shelbie Hutching, RN Phone Number: 05/15/2021, 4:25 PM  Clinical Narrative:                 Patient admitted to the hospital after falling at home with generalized weakness and rib fractures and UTI.  RNCM met with patient at the bedside in the emergency department.  Patient reports that he is from home where he lives with his daughter.  He reports that he fell at home when he leaned up against something and it gave way.  He walks with a rollator.  He was just recently discharged from [facility] this past Saturday.  He reports that he was at [facility] for 2 weeks then his insurance ran out and he could not afford the daily copay to stay longer.  Patient is open with [facility] for PT, Corene Cornea with [facility] aware of admission.    Patient declined working with PT today saying that he was too sore and wanted a couple of days to limber up.    TOC will follow and assist with discharge needs.   Expected Discharge Plan: [place] Barriers to Discharge: Continued Medical Work up   Patient Goals and CMS Choice Patient states their goals for this hospitalization and ongoing recovery are:: Patient wants to get back to where he can walk and take care of himself      Expected Discharge Plan and Services Expected Discharge Plan: [place]   Discharge Planning Services: CM Consult   Living arrangements for the past 2 months: Single Family Home, [place]                 DME Arranged: N/A DME Agency: NA       HH Arranged: PT [place] Agency: [facility] ([place]) Date HH Agency Contacted: 05/15/21 Time [place]: [id number] Representative spoke with at [place]: Corene Cornea  Prior Living Arrangements/Services Living arrangements  for the past 2 months: [place], [place] Lives with:: Adult Children Patient language and need for interpreter reviewed:: Yes Do you feel safe going back to the place where you live?: Yes      Need for Family Participation in Patient Care: Yes (Comment) Care giver support system in place?: Yes (comment) (daughter) Current home services: DME (rollator) Criminal Activity/Legal Involvement Pertinent to Current Situation/Hospitalization: No - Comment as needed  Activities of Daily Living      Permission Sought/Granted Permission sought to share information with : Case Manager, Family Supports, Other (comment) Permission granted to share information with : Yes, Verbal Permission Granted  Share Information with NAME: Lexx Monte  Permission granted to share info w AGENCY: [facility]  Permission granted to share info w Relationship: daughter  Permission granted to share info w Contact Information: [phone]  Emotional Assessment Appearance:: Appears stated age Attitude/Demeanor/Rapport: Engaged Affect (typically observed): Accepting Orientation: : Oriented to Self, Oriented to Place, Oriented to Situation Alcohol / Substance Use: Not Applicable Psych Involvement: No (comment)  Admission diagnosis:  Fall [W19.XXXA] Acute cystitis without hematuria [N30.00] Generalized weakness [R53.1] Closed fracture of multiple ribs of right side, initial encounter [S22.41XA] Patient Active Problem List   Diagnosis Date Noted   Fall 05/14/2021   Acute encephalopathy 01/30/2018   Chronic kidney  disease 04/05/2017   GERD (gastroesophageal reflux disease) 04/05/2017   Pain 04/05/2017   Weakness 04/05/2017   Essential hypertension 07/31/2014   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 [place] [address] Phone: [phone] Fax: [phone]     Social Determinants of Health (SDOH) Interventions    Readmission Risk Interventions No flowsheet data found.

## 2021-05-15 NOTE — Progress Notes (Signed)
PROGRESS NOTE    Adam Rios.  LHT:[id number] DOB: Dec 28, 1940 DOA: 05/14/2021 PCP: Dola Argyle, MD   Assessment & Plan:   Principal Problem:   Fall  Generalized weakness: s/p fall at home w/ several rib fractures. PT/OT consulted  Likely UTI: UA is positive. Urine cx is pending. Continue on IV rocephin. Hx of recurrent UTIs.   HTN: continue on losartan   HLD: continue on statin   BPH: continue on flomax   DVT prophylaxis: lovenox Code Status: Full Family Communication: discussed pt's care w/ pt's daughter, [name], and answered her questions  Disposition Plan: depends on PT/OT recs   Level of care: Med-Surg  Status is: Inpatient  Remains inpatient appropriate because: severity of illness    Consultants:    Procedures:   Antimicrobials: rocephin    Subjective: Pt c/o rib fracture   Objective: Vitals:   05/15/21 0200 05/15/21 0234 05/15/21 0300 05/15/21 0811  BP: (!) 141/90 (!) 141/96 124/85 112/68  Pulse: 90 (!) 108 89 91  Resp: 16 18 14 17   Temp:    98.1 F (36.7 C)  TempSrc:    Tympanic  SpO2: 91% 93% 92% 91%    Intake/Output Summary (Last 24 hours) at 05/15/2021 0823 Last data filed at 05/14/2021 2353 Gross per 24 hour  Intake 996.67 ml  Output 80 ml  Net 916.67 ml   There were no vitals filed for this visit.  Examination:  General exam: Appears calm and comfortable  Respiratory system: Clear to auscultation. Respiratory effort normal. Cardiovascular system: S1 & S2 +. No rubs, gallops or clicks.  Gastrointestinal system: Abdomen is nondistended, soft and nontender.  Normal bowel sounds heard. Central nervous system: Alert and oriented. Moves all extremities  Psychiatry: Judgement and insight appear normal. Mood and affect     Data Reviewed: I have personally reviewed following labs and imaging studies  CBC: Recent Labs  Lab 05/14/21 1701 05/15/21 0643  WBC 9.3 8.9  HGB 13.9 12.4*  HCT 43.5 38.6*  MCV 87.7 85.6  PLT  275 572   Basic Metabolic Panel: Recent Labs  Lab 05/14/21 1701 05/15/21 0643  NA 135 135  K 4.5 4.1  CL 101 102  CO2 25 26  GLUCOSE 124* 123*  BUN 21 18  CREATININE 1.12 0.93  CALCIUM 9.2 8.8*   GFR: CrCl cannot be calculated (Unknown ideal weight.). Liver Function Tests: No results for input(s): AST, ALT, ALKPHOS, BILITOT, PROT, ALBUMIN in the last 168 hours. No results for input(s): LIPASE, AMYLASE in the last 168 hours. No results for input(s): AMMONIA in the last 168 hours. Coagulation Profile: No results for input(s): INR, PROTIME in the last 168 hours. Cardiac Enzymes: No results for input(s): CKTOTAL, CKMB, CKMBINDEX, TROPONINI in the last 168 hours. BNP (last 3 results) No results for input(s): PROBNP in the last 8760 hours. HbA1C: No results for input(s): HGBA1C in the last 72 hours. CBG: No results for input(s): GLUCAP in the last 168 hours. Lipid Profile: No results for input(s): CHOL, HDL, LDLCALC, TRIG, CHOLHDL, LDLDIRECT in the last 72 hours. Thyroid Function Tests: No results for input(s): TSH, T4TOTAL, FREET4, T3FREE, THYROIDAB in the last 72 hours. Anemia Panel: No results for input(s): VITAMINB12, FOLATE, FERRITIN, TIBC, IRON, RETICCTPCT in the last 72 hours. Sepsis Labs: No results for input(s): PROCALCITON, LATICACIDVEN in the last 168 hours.  Recent Results (from the past 240 hour(s))  Resp Panel by RT-PCR (Flu A&B, Covid) Nasopharyngeal Swab     Status: None  Collection Time: 05/14/21 10:40 PM   Specimen: Nasopharyngeal Swab; Nasopharyngeal(NP) swabs in vial transport medium  Result Value Ref Range Status   SARS Coronavirus 2 by RT PCR NEGATIVE NEGATIVE Final    Comment: (NOTE) SARS-CoV-2 target nucleic acids are NOT DETECTED.  The SARS-CoV-2 RNA is generally detectable in upper respiratory specimens during the acute phase of infection. The lowest concentration of SARS-CoV-2 viral copies this assay can detect is 138 copies/mL. A negative  result does not preclude SARS-Cov-2 infection and should not be used as the sole basis for treatment or other patient management decisions. A negative result may occur with  improper specimen collection/handling, submission of specimen other than nasopharyngeal swab, presence of viral mutation(s) within the areas targeted by this assay, and inadequate number of viral copies(<138 copies/mL). A negative result must be combined with clinical observations, patient history, and epidemiological information. The expected result is Negative.  Fact Sheet for Patients:  EntrepreneurPulse.com.au  Fact Sheet for Healthcare Providers:  IncredibleEmployment.be  This test is no t yet approved or cleared by the [place] FDA and  has been authorized for detection and/or diagnosis of SARS-CoV-2 by FDA under an Emergency Use Authorization (EUA). This EUA will remain  in effect (meaning this test can be used) for the duration of the COVID-19 declaration under Section 564(b)(1) of the Act, 21 U.S.C.section 360bbb-3(b)(1), unless the authorization is terminated  or revoked sooner.       Influenza A by PCR NEGATIVE NEGATIVE Final   Influenza B by PCR NEGATIVE NEGATIVE Final    Comment: (NOTE) The Xpert Xpress SARS-CoV-2/FLU/RSV plus assay is intended as an aid in the diagnosis of influenza from Nasopharyngeal swab specimens and should not be used as a sole basis for treatment. Nasal washings and aspirates are unacceptable for Xpert Xpress SARS-CoV-2/FLU/RSV testing.  Fact Sheet for Patients: EntrepreneurPulse.com.au  Fact Sheet for Healthcare Providers: IncredibleEmployment.be  This test is not yet approved or cleared by the [place] FDA and has been authorized for detection and/or diagnosis of SARS-CoV-2 by FDA under an Emergency Use Authorization (EUA). This EUA will remain in effect (meaning this test can be used)  for the duration of the COVID-19 declaration under Section 564(b)(1) of the Act, 21 U.S.C. section 360bbb-3(b)(1), unless the authorization is terminated or revoked.  Performed at [facility], [address] [place], [place] [address]          Radiology Studies: CT ABDOMEN PELVIS WO CONTRAST  Result Date: 05/14/2021 CLINICAL DATA:  [place] last weekend, abdominal pain, constipation EXAM: CT ABDOMEN AND PELVIS WITHOUT CONTRAST TECHNIQUE: Multidetector CT imaging of the abdomen and pelvis was performed following the standard protocol without IV contrast. Unenhanced CT was performed per clinician order. Lack of IV contrast limits sensitivity and specificity, especially for evaluation of abdominal/pelvic solid viscera. COMPARISON:  01/13/2010 FINDINGS: Lower chest: Hypoventilatory changes are seen at the lung bases. Hepatobiliary: 3.7 cm left lobe liver cyst identified. Otherwise unremarkable unenhanced appearance of the liver and gallbladder. 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 Pancreas: Unremarkable unenhanced appearance. Spleen: Unremarkable unenhanced appearance. Adrenals/Urinary Tract: Left nephrectomy. No right-sided renal calculi or obstructive uropathy. Bladder is decompressed by Foley catheter. The adrenals are unremarkable. Stomach/Bowel: No bowel obstruction or ileus. There is a large amount of retained stool throughout the colon consistent with constipation. No bowel wall thickening or inflammatory change. The appendix appears surgically absent. Vascular/Lymphatic: Aortic atherosclerosis. No enlarged abdominal or pelvic lymph nodes. Reproductive: Marked enlargement of  the prostate, measuring 8.1 x 7.5 cm. Other: No free intraperitoneal fluid or free gas. No abdominal wall hernia. Musculoskeletal: There are displaced right lateral eighth through tenth rib fractures, compatible with history of recent trauma. No other acute bony  abnormalities. Reconstructed images demonstrate no additional findings. IMPRESSION: 1. Right lateral eighth through tenth rib fractures, consistent with recent trauma. 2. No other acute intra-abdominal or intrapelvic trauma on this exam limited by the lack of IV contrast. 3. Marked fecal retention consistent with constipation. 4. Marked enlargement of the prostate. 5. Eventration of the right hemidiaphragm. 6.  Aortic Atherosclerosis (ICD10-I70.0). Electronically Signed   By: Randa Ngo M.D.   On: 05/14/2021 21:30   DG Chest 2 View  Result Date: 05/14/2021 CLINICAL DATA:  Weakness, fall EXAM: CHEST - 2 VIEW COMPARISON:  01/29/2018 FINDINGS: Low lung volumes. Eventration of the right diaphragm. No focal consolidation. No pleural effusion or pneumothorax. The heart is normal in size.  Thoracic aortic atherosclerosis. Calcified mediastinal nodes, chronic. Visualized osseous structures are within normal limits. IMPRESSION: No evidence of acute cardiopulmonary disease. Electronically Signed   By: Julian Hy M.D.   On: 05/14/2021 19:35   CT HEAD WO CONTRAST  Result Date: 05/14/2021 CLINICAL DATA:  fall EXAM: CT HEAD WITHOUT CONTRAST CT CERVICAL SPINE WITHOUT CONTRAST TECHNIQUE: Multidetector CT imaging of the head and cervical spine was performed following the standard protocol without intravenous contrast. Multiplanar CT image reconstructions of the cervical spine were also generated. COMPARISON:  CT head 01/29/2017. FINDINGS: CT HEAD FINDINGS Brain: Hypodensity in the right occipital lobe, likely encephalomalacia/gliosis from the hemorrhage seen on prior CT from 2018. No evidence of acute large vascular territory infarct, acute hemorrhage, mass lesion, or midline shift. Generalized atrophy. Redemonstrated ventriculomegaly which is mildly out of proportion to the degree of sulcal volume loss. There is also a somewhat acute callosum angle and some crowding of sulci at the vertex. Vascular: Calcific  intracranial atherosclerosis. No hyperdense vessel identified. Skull: No acute fracture. Sinuses/Orbits: Visualized sinuses are clear. Unremarkable visualized orbits. Other: No mastoid effusions. CT CERVICAL SPINE FINDINGS Alignment: No substantial sagittal subluxation. Skull base and vertebrae: Vertebral body heights are maintained. No evidence of acute fracture. Soft tissues and spinal canal: No prevertebral fluid or swelling. No visible canal hematoma. Disc levels: Multilevel facet/uncovertebral hypertrophy with varying degrees of neural foraminal stenosis. Mild multilevel degenerative disc disease. Upper chest: Visualized lung apices are clear on motion limited assessment. Other: Calcific atherosclerosis at bilateral carotid bifurcations. IMPRESSION: CT head: 1. No evidence of acute intracranial abnormality. 2. 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 3. Redemonstrated ventriculomegaly, which is slightly out of proportion to the degree of sulcal dilation. This could be due to central predominant volume loss; however, there are some findings that can be seen with normal pressure hydrocephalus (NPH). Recommend correlation with the presence or absence of signs/symptoms of NPH. CT cervical spine: No evidence of acute fracture or traumatic malalignment. Electronically Signed   By: Margaretha Sheffield M.D.   On: 05/14/2021 19:15   CT Cervical Spine Wo Contrast  Result Date: 05/14/2021 CLINICAL DATA:  fall EXAM: CT HEAD WITHOUT CONTRAST CT CERVICAL SPINE WITHOUT CONTRAST TECHNIQUE: Multidetector CT imaging of the head and cervical spine was performed following the standard protocol without intravenous contrast. Multiplanar CT image reconstructions of the cervical spine were also generated. COMPARISON:  CT head 01/29/2017. FINDINGS: CT HEAD FINDINGS Brain: Hypodensity in the right occipital lobe, likely encephalomalacia/gliosis from the hemorrhage seen on prior CT from 2018. No evidence of  acute  large vascular territory infarct, acute hemorrhage, mass lesion, or midline shift. Generalized atrophy. Redemonstrated ventriculomegaly which is mildly out of proportion to the degree of sulcal volume loss. There is also a somewhat acute callosum angle and some crowding of sulci at the vertex. Vascular: Calcific intracranial atherosclerosis. No hyperdense vessel identified. Skull: No acute fracture. Sinuses/Orbits: Visualized sinuses are clear. Unremarkable visualized orbits. Other: No mastoid effusions. CT CERVICAL SPINE FINDINGS Alignment: No substantial sagittal subluxation. Skull base and vertebrae: Vertebral body heights are maintained. No evidence of acute fracture. Soft tissues and spinal canal: No prevertebral fluid or swelling. No visible canal hematoma. Disc levels: Multilevel facet/uncovertebral hypertrophy with varying degrees of neural foraminal stenosis. Mild multilevel degenerative disc disease. Upper chest: Visualized lung apices are clear on motion limited assessment. Other: Calcific atherosclerosis at bilateral carotid bifurcations. IMPRESSION: CT head: 1. No evidence of acute intracranial abnormality. 2. 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 3. Redemonstrated ventriculomegaly, which is slightly out of proportion to the degree of sulcal dilation. This could be due to central predominant volume loss; however, there are some findings that can be seen with normal pressure hydrocephalus (NPH). Recommend correlation with the presence or absence of signs/symptoms of NPH. CT cervical spine: No evidence of acute fracture or traumatic malalignment. Electronically Signed   By: Margaretha Sheffield M.D.   On: 05/14/2021 19:15        Scheduled Meds:  aspirin  81 mg Oral Daily   atorvastatin  40 mg Oral Daily   donepezil  5 mg Oral QHS   enoxaparin (LOVENOX) injection  40 mg Subcutaneous Q24H   gabapentin  300 mg Oral BID   losartan  25 mg Oral Daily   mirabegron ER  25 mg Oral  Daily   pantoprazole  40 mg Oral Daily   tamsulosin  0.4 mg Oral Daily   vitamin B-12  1,000 mcg Oral Daily   Continuous Infusions:  sodium chloride 100 mL/hr at 05/14/21 2359   cefTRIAXone (ROCEPHIN)  IV       LOS: 1 day    Time spent: 59 mins     Wyvonnia Dusky, MD [facility] Pager [phone]  If 7PM-7AM, please contact night-coverage 05/15/2021, 8:23 AM

## 2021-05-15 NOTE — ED Notes (Signed)
Informed RN bed assigned 

## 2021-05-15 NOTE — Evaluation (Signed)
Occupational Therapy Evaluation Patient Details Name: Adam Rios. MRN: [id number] DOB: 10-Nov-1940 Today's Date: 05/15/2021   History of Present Illness Adam Rios. is a 80 y.o. Caucasian male with medical history significant for essential hypertension, CVA, neurogenic bladder and renal cell carcinoma status post nephrectomy, cognitive dysfunction, spinal stenosis and diastolic dysfunction, who presented to the ER with acute onset of generalized weakness and inability to walk.   Clinical Impression   Pt seen for OT evaluation this date in setting of acute hospitalization d/t weakness. He presents with decreased fxl activity tolerance and pain limited ability to participate in ADLs/ADL mobility. He requires MAX A +2 for in bed repositioning and declines further mobilization citing pain, despite encouragement and education. On ADL assessment this date, he requires: SETUP for bed level (chair position) for UB ADLS including self-feeding, requires MIN A for UB dressing. Pt requires MAX/TOTAL A for LB ADLs bed level d/t pain. Pt placed in chair position to optimize self-feeding. RN aware. All needs met and in reach.      Recommendations for follow up therapy are one component of a multi-disciplinary discharge planning process, led by the attending physician.  Recommendations may be updated based on patient status, additional functional criteria and insurance authorization.   Follow Up Recommendations  Skilled nursing-short term rehab (<3 hours/day)    Assistance Recommended at Discharge Frequent or constant Supervision/Assistance  Functional Status Assessment  Patient has had a recent decline in their functional status and demonstrates the ability to make significant improvements in function in a reasonable and predictable amount of time.  Equipment Recommendations  Other (comment) (defer)    Recommendations for Other Services       Precautions / Restrictions  Precautions Precautions: Fall Restrictions Weight Bearing Restrictions: No      Mobility Bed Mobility Overal bed mobility: Needs Assistance             General bed mobility comments: MAX A +2 for in-bed repositioning, refuses to mobilize with therapist beyond repositioning citing pain    Transfers                   General transfer comment: deferred, pain      Balance                                           ADL either performed or assessed with clinical judgement   ADL                                         General ADL Comments: SETUP for bed level (chair position) for UB ADLS including self-feeding, requires MIN A for UB dressing. Pt requires MAX/TOTAL A for LB ADLs bed level d/t pain     Vision Patient Visual Report: No change from baseline       Perception     Praxis      Pertinent Vitals/Pain Pain Assessment: 0-10 Pain Score: 10-Worst pain ever Pain Location: ribs Pain Descriptors / Indicators: Guarding Pain Intervention(s): Limited activity within patient's tolerance;Monitored during session     Hand Dominance     Extremity/Trunk Assessment Upper Extremity Assessment Upper Extremity Assessment: Generalized weakness (low tolerance for ROM d/t pain)   Lower Extremity Assessment Lower Extremity Assessment: Generalized weakness  Communication Communication Communication: No difficulties   Cognition Arousal/Alertness: Awake/alert Behavior During Therapy: WFL for tasks assessed/performed Overall Cognitive Status: Within Functional Limits for tasks assessed                                 General Comments: pt appears able to follow all commands and oriented, but conflicting PLOF compared to statements by family regarding mobilization     General Comments       Exercises Other Exercises Other Exercises: ed re: importance of positioning for self-feeding to reduce risk of  aspiration, role of OT, imporatnce of mobilization   Shoulder Instructions      Home Living Family/patient expects to be discharged to:: Private residence Living Arrangements: Children                                      Prior Functioning/Environment Prior Level of Function : Patient poor historian/Family not available             Mobility Comments: private residence per old therapy charting, but unsure of current situation as pt reports that he is able to walk at baseline, but RN reports that family dsiputes this.          OT Problem List: Decreased strength;Decreased activity tolerance;Decreased knowledge of use of DME or AE;Pain      OT Treatment/Interventions: Self-care/ADL training;Therapeutic exercise;Therapeutic activities;Patient/family education    OT Goals(Current goals can be found in the care plan section) Acute Rehab OT Goals Patient Stated Goal: to get pain better controlled OT Goal Formulation: With patient Time For Goal Achievement: 05/29/21 Potential to Achieve Goals: Good ADL Goals Pt Will Transfer to Toilet: with max assist;with +2 assist;squat pivot transfer;bedside commode Pt Will Perform Toileting - Clothing Manipulation and hygiene: with max assist;sitting/lateral leans Additional ADL Goal #1: Pt will tolerate further mobilization/graded theract (at least sup to sit with MAX A) to allow for further development of OT POC  OT Frequency: Min 2X/week   Barriers to D/C:            Co-evaluation              AM-PAC OT "6 Clicks" Daily Activity     Outcome Measure Help from another person eating meals?: A Little Help from another person taking care of personal grooming?: A Little Help from another person toileting, which includes using toliet, bedpan, or urinal?: A Lot Help from another person bathing (including washing, rinsing, drying)?: A Lot Help from another person to put on and taking off regular upper body clothing?: A  Little Help from another person to put on and taking off regular lower body clothing?: A Lot 6 Click Score: 15   End of Session Equipment Utilized During Treatment: Gait belt;Rolling walker (2 wheels) Nurse Communication: Mobility status  Activity Tolerance: Patient tolerated treatment well Patient left: in bed;with call bell/phone within reach;with bed alarm set  OT Visit Diagnosis: Unsteadiness on feet (R26.81);History of falling (Z91.81)                Time: 2099-0689 OT Time Calculation (min): 21 min Charges:  OT General Charges $OT Visit: 1 Visit OT Evaluation $OT Eval Moderate Complexity: Cornelia, MS, OTR/L ascom [phone] 05/15/21, 6:18 PM

## 2021-05-15 NOTE — Plan of Care (Signed)
Patient arrived to unit from ED. Continues on telemetry monitoring. Oxygen at 2 liters via nasal cannula. Pillows used for positioning to assist with pain.   Problem: Clinical Measurements: Goal: Will remain free from infection Outcome: Progressing   Problem: Activity: Goal: Risk for activity intolerance will decrease Outcome: Progressing   Problem: Elimination: Goal: Will not experience complications related to bowel motility Outcome: Progressing Goal: Will not experience complications related to urinary retention Outcome: Progressing   Problem: Pain Managment: Goal: General experience of comfort will improve Outcome: Progressing   Problem: Safety: Goal: Ability to remain free from injury will improve Outcome: Progressing   Problem: Skin Integrity: Goal: Risk for impaired skin integrity will decrease Outcome: Progressing

## 2021-05-16 DIAGNOSIS — T83518A Infection and inflammatory reaction due to other urinary catheter, initial encounter: Secondary | ICD-10-CM | POA: Diagnosis not present

## 2021-05-16 DIAGNOSIS — N390 Urinary tract infection, site not specified: Secondary | ICD-10-CM | POA: Diagnosis not present

## 2021-05-16 DIAGNOSIS — E7849 Other hyperlipidemia: Secondary | ICD-10-CM | POA: Diagnosis not present

## 2021-05-16 DIAGNOSIS — S2241XA Multiple fractures of ribs, right side, initial encounter for closed fracture: Secondary | ICD-10-CM | POA: Diagnosis not present

## 2021-05-16 LAB — MRSA NEXT GEN BY PCR, NASAL: MRSA by PCR Next Gen: DETECTED — AB

## 2021-05-16 LAB — BASIC METABOLIC PANEL
Anion gap: 5 (ref 5–15)
BUN: 19 mg/dL (ref 8–23)
CO2: 26 mmol/L (ref 22–32)
Calcium: 8.8 mg/dL — ABNORMAL LOW (ref 8.9–10.3)
Chloride: 105 mmol/L (ref 98–111)
Creatinine, Ser: 0.98 mg/dL (ref 0.61–1.24)
GFR, Estimated: >60 mL/min (ref >60)
Glucose, Bld: 95 mg/dL (ref 70–99)
Potassium: 4 mmol/L (ref 3.5–5.1)
Sodium: 136 mmol/L (ref 135–145)

## 2021-05-16 LAB — CBC
HCT: 38.5 % — ABNORMAL LOW (ref 39.0–52.0)
Hemoglobin: 12.1 g/dL — ABNORMAL LOW (ref 13.0–17.0)
MCH: 27.4 pg (ref 26.0–34.0)
MCHC: 31.4 g/dL (ref 30.0–36.0)
MCV: 87.1 fL (ref 80.0–100.0)
Platelets: 244 10*3/uL (ref 150–400)
RBC: 4.42 MIL/uL (ref 4.22–5.81)
RDW: 14.5 % (ref 11.5–15.5)
WBC: 7.6 10*3/uL (ref 4.0–10.5)
nRBC: 0 % (ref 0.0–0.2)

## 2021-05-16 MED ORDER — TRAMADOL HCL 50 MG PO TABS
50.0000 mg | ORAL_TABLET | Freq: Once | ORAL | Status: AC
  Administered 2021-05-16: 50 mg via ORAL
  Filled 2021-05-16: qty 1

## 2021-05-16 NOTE — NC FL2 (Signed)
[place] LEVEL OF CARE SCREENING TOOL     IDENTIFICATION  Patient Name: Adam Rios. Birthdate: 07-Jan-1941 Sex: male Admission Date (Current Location): 05/14/2021  [facility] and [place] Number:  Engineering geologist and Address:  [facility], [address], [place], [place] [address]      Provider Number: [id number]  Attending Physician Name and Address:  Wyvonnia Dusky, MD  Relative Name and Phone Number:       Current Level of Care: Hospital Recommended Level of Care: [place] Prior Approval Number:    Date Approved/Denied:   PASRR Number: [id number]  Discharge Plan: SNF    Current Diagnoses: Patient Active Problem List   Diagnosis Date Noted   Fall 05/14/2021   Acute encephalopathy 01/30/2018   Chronic kidney disease 04/05/2017   GERD (gastroesophageal reflux disease) 04/05/2017   Pain 04/05/2017   Weakness 04/05/2017   Essential hypertension 07/31/2014   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 04/10/2014   Ventral hernia without obstruction or gangrene 04/10/2014   Acute low back pain without sciatica 12/26/2013   Fusion of spine of lumbar region 12/26/2013   Edema 09/26/2013   Hemorrhoid 09/26/2013   Neurogenic bladder 09/26/2013   Renal cell cancer ([place]) 09/26/2013   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   Lower urinary tract infectious disease 10/28/2012    Orientation RESPIRATION BLADDER Height & Weight     Self, Time  O2 (Nasal Cannula 2 L) Incontinent, Indwelling catheter Weight: 223 lb 1.7 oz (101.2 kg) Height:  6\' 1"  (185.4 cm)  BEHAVIORAL SYMPTOMS/MOOD NEUROLOGICAL BOWEL NUTRITION STATUS   (None)  (None) Continent Diet (Heart healthy)  AMBULATORY STATUS COMMUNICATION OF NEEDS Skin   Extensive Assist Verbally Normal                       Personal Care Assistance Level of Assistance  Bathing, Feeding, Dressing Bathing Assistance: Maximum assistance Feeding assistance: Maximum  assistance Dressing Assistance: Maximum assistance     Functional Limitations Info  Sight, Hearing, Speech Sight Info: Adequate Hearing Info: Adequate Speech Info: Adequate    SPECIAL CARE FACTORS FREQUENCY  PT (By licensed PT), OT (By licensed OT)     PT Frequency: 5 x week OT Frequency: 5 x week            Contractures Contractures Info: Not present    Additional Factors Info  Code Status, Allergies, Isolation Precautions Code Status Info: Full code Allergies Info: Fentanyl, Oxycodone     Isolation Precautions Info: Contact: ESBL, MRSA     Current Medications (05/16/2021):  This is the current hospital active medication list Current Facility-Administered Medications  Medication Dose Route Frequency Provider Last Rate Last Admin   acetaminophen (TYLENOL) tablet 650 mg  650 mg Oral Q6H PRN Mansy, Jan A, MD   650 mg at 05/16/21 0851   Or   acetaminophen (TYLENOL) suppository 650 mg  650 mg Rectal Q6H PRN Mansy, Jan A, MD       aspirin chewable tablet 81 mg  81 mg Oral Daily Mansy, Jan A, MD   81 mg at 05/16/21 0852   atorvastatin (LIPITOR) tablet 40 mg  40 mg Oral Daily Mansy, Jan A, MD   40 mg at 05/16/21 0852   cefTRIAXone (ROCEPHIN) 2 g in sodium chloride 0.9 % 100 mL IVPB  2 g Intravenous Q24H Mansy, Jan A, MD 200 mL/hr at 05/16/21 1238 2 g at 05/16/21 1238   Chlorhexidine Gluconate Cloth  2 % PADS 6 each  6 each Topical Daily Wyvonnia Dusky, MD   6 each at 05/16/21 0851   donepezil (ARICEPT) tablet 5 mg  5 mg Oral QHS Mansy, Jan A, MD   5 mg at 05/15/21 2028   enoxaparin (LOVENOX) injection 40 mg  40 mg Subcutaneous Q24H Mansy, Jan A, MD   40 mg at 05/16/21 0851   furosemide (LASIX) tablet 20 mg  20 mg Oral Daily PRN Mansy, Jan A, MD       gabapentin (NEURONTIN) capsule 300 mg  300 mg Oral BID Mansy, Jan A, MD   300 mg at 05/16/21 3154   losartan (COZAAR) tablet 25 mg  25 mg Oral Daily Mansy, Jan A, MD   25 mg at 05/16/21 0853   magnesium hydroxide (MILK OF  MAGNESIA) suspension 30 mL  30 mL Oral Daily PRN Mansy, Jan A, MD       mirabegron [facility]) tablet 25 mg  25 mg Oral Daily Mansy, Jan A, MD   25 mg at 05/16/21 0853   ondansetron (ZOFRAN) tablet 4 mg  4 mg Oral Q6H PRN Mansy, Jan A, MD       Or   ondansetron [facility]) injection 4 mg  4 mg Intravenous Q6H PRN Mansy, Jan A, MD   4 mg at 05/15/21 0550   pantoprazole (PROTONIX) EC tablet 40 mg  40 mg Oral Daily Mansy, Jan A, MD   40 mg at 05/16/21 0086   tamsulosin (FLOMAX) capsule 0.4 mg  0.4 mg Oral Daily Mansy, Jan A, MD   0.4 mg at 05/16/21 7619   tiZANidine (ZANAFLEX) tablet 4 mg  4 mg Oral Q6H PRN Mansy, Jan A, MD       traZODone (DESYREL) tablet 25 mg  25 mg Oral QHS PRN Mansy, Jan A, MD   25 mg at 05/14/21 2359   vitamin B-12 (CYANOCOBALAMIN) tablet 1,000 mcg  1,000 mcg Oral Daily Mansy, Jan A, MD   1,000 mcg at 05/16/21 5093     Discharge Medications: Please see discharge summary for a list of discharge medications.  Relevant Imaging Results:  Relevant Lab Results:   Additional Information SS#: [id number]. Was at [facility] 11/18-12/20.  Candie Chroman, LCSW

## 2021-05-16 NOTE — TOC Progression Note (Signed)
Transition of Care (TOC) - Progression Note    Patient Details  Name: Freeland Pracht. MRN: [id number] Date of Birth: 1941/03/03  Transition of Care [facility]) CM/SW Contact  Candie Chroman, LCSW Phone Number: 05/16/2021, 1:53 PM  Clinical Narrative:  Patient not fully oriented. Called daughter, introduced role, and explained that PT recommendations would be discussed. Patient was at [facility] 11/18-12/10. He is in his copay days. Daughter agreeable to SNF again if we can find a facility that can work out a payment plan after rehab. She mentioned hiring personal care services for once he returns home. With her permission, made referral to [employer]. [employer] representative will reach out to daughter today.   Expected Discharge Plan: [place] Barriers to Discharge: Continued Medical Work up  Expected Discharge Plan and Services Expected Discharge Plan: [place]   Discharge Planning Services: CM Consult   Living arrangements for the past 2 months: Single Family Home, [place]                 DME Arranged: N/A DME Agency: NA       HH Arranged: PT HH Agency: [place] ([facility]) Date HH Agency Contacted: 05/15/21 Time [place]: 1624 Representative spoke with at [place]: [place] ([place]) Interventions    Readmission Risk Interventions No flowsheet data found.

## 2021-05-16 NOTE — Plan of Care (Signed)
°  Problem: Clinical Measurements: Goal: Will remain free from infection Outcome: Progressing   Problem: Activity: Goal: Risk for activity intolerance will decrease Outcome: Progressing   Problem: Elimination: Goal: Will not experience complications related to bowel motility Outcome: Progressing Goal: Will not experience complications related to urinary retention Outcome: Progressing   Problem: Pain Managment: Goal: General experience of comfort will improve Outcome: Progressing   Problem: Safety: Goal: Ability to remain free from injury will improve Outcome: Progressing   Problem: Skin Integrity: Goal: Risk for impaired skin integrity will decrease Outcome: Progressing

## 2021-05-16 NOTE — Care Management Important Message (Signed)
Important Message  Patient Details  Name: Adam Rios. MRN: [id number] Date of Birth: 09/17/1940   Medicare Important Message Given:  N/A - LOS <3 / Initial given by admissions     [name] 05/16/2021, 8:33 AM

## 2021-05-16 NOTE — Progress Notes (Signed)
Urine collected and sent to lab.

## 2021-05-16 NOTE — Evaluation (Signed)
Physical Therapy Evaluation Patient Details Name: Adam Rios. MRN: [id number] DOB: 1940/06/29 Today's Date: 05/16/2021  History of Present Illness  Adam Rios. is a 80 y.o. Caucasian male with medical history significant for essential hypertension, CVA, neurogenic bladder and renal cell carcinoma status post nephrectomy, cognitive dysfunction, spinal stenosis and diastolic dysfunction, who presented to the ER with acute onset of generalized weakness and inability to walk.   Clinical Impression  Pt oriented to self, place and some situation. Reported 9/10 pain in R flank/ribs. Per chart review pt has been falling, and had recently discharged from [facility]. Daughter unable to provide 24/7 supervision/assistance at discharge.   The patient was able to perform supine exercises with physical assistance except for SAQ. Supine <> sit with maxAx2, limited participation from pt. Pt yelling/groaning in pain throughout, totalA to reposition in midline at EOB. He was able to perform seated balance with CGA only ~10seconds with BUE propped. Unable/unwilling to mobilize further due to pain, RN notified of pt status (medicated prior to session).  Overall the patient demonstrated deficits (see "PT Problem List") that impede the patient's functional abilities, safety, and mobility and would benefit from skilled PT intervention. Recommendation at this time is STR due to current level of assistance needed and limitations in function and mobility.        Recommendations for follow up therapy are one component of a multi-disciplinary discharge planning process, led by the attending physician.  Recommendations may be updated based on patient status, additional functional criteria and insurance authorization.  Follow Up Recommendations Skilled nursing-short term rehab (<3 hours/day)    Assistance Recommended at Discharge    Functional Status Assessment Patient has had a recent decline in their functional status  and demonstrates the ability to make significant improvements in function in a reasonable and predictable amount of time.  Equipment Recommendations  Other (comment) (TBD at next venue of care)    Recommendations for Other Services       Precautions / Restrictions Precautions Precautions: Fall Restrictions Weight Bearing Restrictions: No      Mobility  Bed Mobility Overal bed mobility: Needs Assistance Bed Mobility: Supine to Sit;Sit to Supine     Supine to sit: Max assist;+2 for physical assistance;HOB elevated Sit to supine: Max assist;+2 for physical assistance        Transfers                   General transfer comment: deferred, pain    Ambulation/Gait                  Stairs            Wheelchair Mobility    Modified Rankin (Stroke Patients Only)       Balance Overall balance assessment: Needs assistance Sitting-balance support: Feet unsupported;Bilateral upper extremity supported Sitting balance-Leahy Scale: Poor Sitting balance - Comments: able to maintain seated balance without physical assist ~10seconds prior to returning ot supine. initially maxA-totalA to maintain sitting                                     Pertinent Vitals/Pain Pain Assessment: 0-10 Pain Score: 9  Pain Location: ribs Pain Descriptors / Indicators: Guarding;Aching;Grimacing;Moaning Pain Intervention(s): Limited activity within patient's tolerance;Monitored during session;Premedicated before session;Repositioned    Home Living Family/patient expects to be discharged to:: Private residence Living Arrangements: Children Available Help at Discharge: Available  PRN/intermittently;Family Type of Home: House                  Prior Function Prior Level of Function : Patient poor historian/Family not available             Mobility Comments: private residence per old therapy charting, but unsure of current situation as pt reports that he  is able to walk at baseline, but RN reports that family dsiputes this. per RN daughter reported she is unable to assist during the day       Hand Dominance        Extremity/Trunk Assessment   Upper Extremity Assessment Upper Extremity Assessment: Defer to OT evaluation    Lower Extremity Assessment Lower Extremity Assessment: Generalized weakness    Cervical / Trunk Assessment Cervical / Trunk Assessment: Kyphotic  Communication   Communication: No difficulties  Cognition Arousal/Alertness: Awake/alert Behavior During Therapy: Flat affect                                   General Comments: pt appears able to follow all commands and is oriented to name and place, aware of his fall, but conflicting PLOF compared to statements by family regarding mobilization        General Comments      Exercises General Exercises - Lower Extremity Ankle Circles/Pumps: AROM;Strengthening;Both;5 reps Short Arc Quad: AROM;Strengthening;Both;10 reps Heel Slides: AAROM;Strengthening;Both;10 reps Hip ABduction/ADduction: AAROM;Strengthening;Both;10 reps   Assessment/Plan    PT Assessment Patient needs continued PT services  PT Problem List Decreased strength;Decreased mobility;Decreased activity tolerance;Decreased balance;Pain;Decreased knowledge of use of DME;Decreased knowledge of precautions       PT Treatment Interventions DME instruction;Therapeutic exercise;Gait training;Balance training;Stair training;Neuromuscular re-education;Functional mobility training;Therapeutic activities;Patient/family education    PT Goals (Current goals can be found in the Care Plan section)  Acute Rehab PT Goals Patient Stated Goal: to have less pain PT Goal Formulation: With patient Time For Goal Achievement: 05/30/21 Potential to Achieve Goals: Fair    Frequency Min 2X/week   Barriers to discharge        Co-evaluation               AM-PAC PT "6 Clicks" Mobility   Outcome Measure Help needed turning from your back to your side while in a flat bed without using bedrails?: Total Help needed moving from lying on your back to sitting on the side of a flat bed without using bedrails?: Total Help needed moving to and from a bed to a chair (including a wheelchair)?: Total Help needed standing up from a chair using your arms (e.g., wheelchair or bedside chair)?: Total Help needed to walk in hospital room?: Total Help needed climbing 3-5 steps with a railing? : Total 6 Click Score: 6    End of Session Equipment Utilized During Treatment: Oxygen Activity Tolerance: Patient limited by pain Patient left: in bed;with call bell/phone within reach;with bed alarm set Nurse Communication: Mobility status PT Visit Diagnosis: Other abnormalities of gait and mobility (R26.89);Difficulty in walking, not elsewhere classified (R26.2);Muscle weakness (generalized) (M62.81);Pain Pain - Right/Left: Right Pain - part of body:  (flank due to rib fx)    Time: 2202-5427 PT Time Calculation (min) (ACUTE ONLY): 25 min   Charges:   PT Evaluation $PT Eval Moderate Complexity: 1 Mod PT Treatments $Therapeutic Exercise: 8-22 mins        Lieutenant Diego PT, DPT 11:43 AM,05/16/21

## 2021-05-16 NOTE — Progress Notes (Signed)
PROGRESS NOTE    Adam Rios.  ZOX:[id number] DOB: 07-Mar-1941 DOA: 05/14/2021 PCP: Dola Argyle, MD   Assessment & Plan:   Principal Problem:   Fall  Generalized weakness: s/p fall at home w/ several rib fractures. PT/OT recs SNF   Likely UTI: UA is positive. Urine cx shows containment. Repeat urine cx ordered but unlikely to grow anything. Continue on IV rocephin   HTN: continue losartan   HLD: continue on statin   BPH: continue on flomax    DVT prophylaxis: lovenox Code Status: Full Family Communication: called pt's daughter but no answer so I left a message  Disposition Plan: likely d/c to SNF   Level of care: Med-Surg  Status is: Inpatient  Remains inpatient appropriate because: severity of illness    Consultants:    Procedures:   Antimicrobials: rocephin    Subjective: Pt c/o rib fracture   Objective: Vitals:   05/15/21 1638 05/15/21 1954 05/16/21 0436 05/16/21 0749  BP: 119/76 107/62 122/75 113/63  Pulse: 75 79 75 73  Resp: 18 20 16 18   Temp: (!) 96 F (35.6 C) 98.7 F (37.1 C) 98.4 F (36.9 C) (!) 97.5 F (36.4 C)  TempSrc: Oral Oral Oral Oral  SpO2: 98% 95% 94% 95%  Weight: 101.2 kg     Height: 6\' 1"  (1.854 m)       Intake/Output Summary (Last 24 hours) at 05/16/2021 0804 Last data filed at 05/16/2021 0800 Gross per 24 hour  Intake 1851.58 ml  Output 500 ml  Net 1351.58 ml   Filed Weights   05/15/21 1638  Weight: 101.2 kg    Examination:  General exam: Appears comfortable   Respiratory system: clear breath sounds b/l. Cardiovascular system: S1/S2+. No rubs or clicks  Gastrointestinal system: Abd is soft, NT, obese & hypoactive bowel sounds  Central nervous system:  alert and oriented. Moves all extremities  Psychiatry: judgement and insight appear poor. Flat mood and affect     Data Reviewed: I have personally reviewed following labs and imaging studies  CBC: Recent Labs  Lab 05/14/21 1701 05/15/21 0643  05/16/21 0525  WBC 9.3 8.9 7.6  HGB 13.9 12.4* 12.1*  HCT 43.5 38.6* 38.5*  MCV 87.7 85.6 87.1  PLT 275 267 811   Basic Metabolic Panel: Recent Labs  Lab 05/14/21 1701 05/15/21 0643 05/16/21 0525  NA 135 135 136  K 4.5 4.1 4.0  CL 101 102 105  CO2 25 26 26   GLUCOSE 124* 123* 95  BUN 21 18 19   CREATININE 1.12 0.93 0.98  CALCIUM 9.2 8.8* 8.8*   GFR: Estimated Creatinine Clearance: 75.2 mL/min (by C-G formula based on SCr of 0.98 mg/dL). Liver Function Tests: No results for input(s): AST, ALT, ALKPHOS, BILITOT, PROT, ALBUMIN in the last 168 hours. No results for input(s): LIPASE, AMYLASE in the last 168 hours. No results for input(s): AMMONIA in the last 168 hours. Coagulation Profile: No results for input(s): INR, PROTIME in the last 168 hours. Cardiac Enzymes: No results for input(s): CKTOTAL, CKMB, CKMBINDEX, TROPONINI in the last 168 hours. BNP (last 3 results) No results for input(s): PROBNP in the last 8760 hours. HbA1C: No results for input(s): HGBA1C in the last 72 hours. CBG: No results for input(s): GLUCAP in the last 168 hours. Lipid Profile: No results for input(s): CHOL, HDL, LDLCALC, TRIG, CHOLHDL, LDLDIRECT in the last 72 hours. Thyroid Function Tests: No results for input(s): TSH, T4TOTAL, FREET4, T3FREE, THYROIDAB in the last 72 hours. Anemia Panel:  No results for input(s): VITAMINB12, FOLATE, FERRITIN, TIBC, IRON, RETICCTPCT in the last 72 hours. Sepsis Labs: No results for input(s): PROCALCITON, LATICACIDVEN in the last 168 hours.  Recent Results (from the past 240 hour(s))  Urine Culture     Status: Abnormal   Collection Time: 05/14/21  7:40 PM   Specimen: Urine, Random  Result Value Ref Range Status   Specimen Description   Final    URINE, RANDOM Performed at [facility], [address], [place], [place] [address]    Special Requests   Final    NONE Performed at [facility], [address] [place], [place] [address]     Culture MULTIPLE SPECIES PRESENT, SUGGEST RECOLLECTION (A)  Final   Report Status 05/15/2021 FINAL  Final  Resp Panel by RT-PCR (Flu A&B, Covid) Nasopharyngeal Swab     Status: None   Collection Time: 05/14/21 10:40 PM   Specimen: Nasopharyngeal Swab; Nasopharyngeal(NP) swabs in vial transport medium  Result Value Ref Range Status   SARS Coronavirus 2 by RT PCR NEGATIVE NEGATIVE Final    Comment: (NOTE) SARS-CoV-2 target nucleic acids are NOT DETECTED.  The SARS-CoV-2 RNA is generally detectable in upper respiratory specimens during the acute phase of infection. The lowest concentration of SARS-CoV-2 viral copies this assay can detect is 138 copies/mL. A negative result does not preclude SARS-Cov-2 infection and should not be used as the sole basis for treatment or other patient management decisions. A negative result may occur with  improper specimen collection/handling, submission of specimen other than nasopharyngeal swab, presence of viral mutation(s) within the areas targeted by this assay, and inadequate number of viral copies(<138 copies/mL). A negative result must be combined with clinical observations, patient history, and epidemiological information. The expected result is Negative.  Fact Sheet for Patients:  EntrepreneurPulse.com.au  Fact Sheet for Healthcare Providers:  IncredibleEmployment.be  This test is no t yet approved or cleared by the [place] FDA and  has been authorized for detection and/or diagnosis of SARS-CoV-2 by FDA under an Emergency Use Authorization (EUA). This EUA will remain  in effect (meaning this test can be used) for the duration of the COVID-19 declaration under Section 564(b)(1) of the Act, 21 U.S.C.section 360bbb-3(b)(1), unless the authorization is terminated  or revoked sooner.       Influenza A by PCR NEGATIVE NEGATIVE Final   Influenza B by PCR NEGATIVE NEGATIVE Final    Comment: (NOTE) The  Xpert Xpress SARS-CoV-2/FLU/RSV plus assay is intended as an aid in the diagnosis of influenza from Nasopharyngeal swab specimens and should not be used as a sole basis for treatment. Nasal washings and aspirates are unacceptable for Xpert Xpress SARS-CoV-2/FLU/RSV testing.  Fact Sheet for Patients: EntrepreneurPulse.com.au  Fact Sheet for Healthcare Providers: IncredibleEmployment.be  This test is not yet approved or cleared by the [place] FDA and has been authorized for detection and/or diagnosis of SARS-CoV-2 by FDA under an Emergency Use Authorization (EUA). This EUA will remain in effect (meaning this test can be used) for the duration of the COVID-19 declaration under Section 564(b)(1) of the Act, 21 U.S.C. section 360bbb-3(b)(1), unless the authorization is terminated or revoked.  Performed at [facility], [address], [place], [place] [address]          Radiology Studies: CT ABDOMEN PELVIS WO CONTRAST  Result Date: 05/14/2021 CLINICAL DATA:  [place] last weekend, abdominal pain, constipation EXAM: CT ABDOMEN AND PELVIS WITHOUT CONTRAST TECHNIQUE: Multidetector CT imaging of the abdomen and  pelvis was performed following the standard protocol without IV contrast. Unenhanced CT was performed per clinician order. Lack of IV contrast limits sensitivity and specificity, especially for evaluation of abdominal/pelvic solid viscera. COMPARISON:  01/13/2010 FINDINGS: Lower chest: Hypoventilatory changes are seen at the lung bases. Hepatobiliary: 3.7 cm left lobe liver cyst identified. Otherwise unremarkable unenhanced appearance of the liver and gallbladder. 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 Pancreas: Unremarkable unenhanced appearance. Spleen: Unremarkable unenhanced appearance. Adrenals/Urinary Tract: Left nephrectomy. No right-sided renal calculi or obstructive uropathy. Bladder is  decompressed by Foley catheter. The adrenals are unremarkable. Stomach/Bowel: No bowel obstruction or ileus. There is a large amount of retained stool throughout the colon consistent with constipation. No bowel wall thickening or inflammatory change. The appendix appears surgically absent. Vascular/Lymphatic: Aortic atherosclerosis. No enlarged abdominal or pelvic lymph nodes. Reproductive: Marked enlargement of the prostate, measuring 8.1 x 7.5 cm. Other: No free intraperitoneal fluid or free gas. No abdominal wall hernia. Musculoskeletal: There are displaced right lateral eighth through tenth rib fractures, compatible with history of recent trauma. No other acute bony abnormalities. Reconstructed images demonstrate no additional findings. IMPRESSION: 1. Right lateral eighth through tenth rib fractures, consistent with recent trauma. 2. No other acute intra-abdominal or intrapelvic trauma on this exam limited by the lack of IV contrast. 3. Marked fecal retention consistent with constipation. 4. Marked enlargement of the prostate. 5. Eventration of the right hemidiaphragm. 6.  Aortic Atherosclerosis (ICD10-I70.0). Electronically Signed   By: Randa Ngo M.D.   On: 05/14/2021 21:30   DG Chest 2 View  Result Date: 05/14/2021 CLINICAL DATA:  Weakness, fall EXAM: CHEST - 2 VIEW COMPARISON:  01/29/2018 FINDINGS: Low lung volumes. Eventration of the right diaphragm. No focal consolidation. No pleural effusion or pneumothorax. The heart is normal in size.  Thoracic aortic atherosclerosis. Calcified mediastinal nodes, chronic. Visualized osseous structures are within normal limits. IMPRESSION: No evidence of acute cardiopulmonary disease. Electronically Signed   By: Julian Hy M.D.   On: 05/14/2021 19:35   CT HEAD WO CONTRAST  Result Date: 05/14/2021 CLINICAL DATA:  fall EXAM: CT HEAD WITHOUT CONTRAST CT CERVICAL SPINE WITHOUT CONTRAST TECHNIQUE: Multidetector CT imaging of the head and cervical spine  was performed following the standard protocol without intravenous contrast. Multiplanar CT image reconstructions of the cervical spine were also generated. COMPARISON:  CT head 01/29/2017. FINDINGS: CT HEAD FINDINGS Brain: Hypodensity in the right occipital lobe, likely encephalomalacia/gliosis from the hemorrhage seen on prior CT from 2018. No evidence of acute large vascular territory infarct, acute hemorrhage, mass lesion, or midline shift. Generalized atrophy. Redemonstrated ventriculomegaly which is mildly out of proportion to the degree of sulcal volume loss. There is also a somewhat acute callosum angle and some crowding of sulci at the vertex. Vascular: Calcific intracranial atherosclerosis. No hyperdense vessel identified. Skull: No acute fracture. Sinuses/Orbits: Visualized sinuses are clear. Unremarkable visualized orbits. Other: No mastoid effusions. CT CERVICAL SPINE FINDINGS Alignment: No substantial sagittal subluxation. Skull base and vertebrae: Vertebral body heights are maintained. No evidence of acute fracture. Soft tissues and spinal canal: No prevertebral fluid or swelling. No visible canal hematoma. Disc levels: Multilevel facet/uncovertebral hypertrophy with varying degrees of neural foraminal stenosis. Mild multilevel degenerative disc disease. Upper chest: Visualized lung apices are clear on motion limited assessment. Other: Calcific atherosclerosis at bilateral carotid bifurcations. IMPRESSION: CT head: 1. No evidence of acute intracranial abnormality. 2. 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 3. Redemonstrated ventriculomegaly, which is slightly out of proportion  to the degree of sulcal dilation. This could be due to central predominant volume loss; however, there are some findings that can be seen with normal pressure hydrocephalus (NPH). Recommend correlation with the presence or absence of signs/symptoms of NPH. CT cervical spine: No evidence of acute fracture or  traumatic malalignment. Electronically Signed   By: Margaretha Sheffield M.D.   On: 05/14/2021 19:15   CT Cervical Spine Wo Contrast  Result Date: 05/14/2021 CLINICAL DATA:  fall EXAM: CT HEAD WITHOUT CONTRAST CT CERVICAL SPINE WITHOUT CONTRAST TECHNIQUE: Multidetector CT imaging of the head and cervical spine was performed following the standard protocol without intravenous contrast. Multiplanar CT image reconstructions of the cervical spine were also generated. COMPARISON:  CT head 01/29/2017. FINDINGS: CT HEAD FINDINGS Brain: Hypodensity in the right occipital lobe, likely encephalomalacia/gliosis from the hemorrhage seen on prior CT from 2018. No evidence of acute large vascular territory infarct, acute hemorrhage, mass lesion, or midline shift. Generalized atrophy. Redemonstrated ventriculomegaly which is mildly out of proportion to the degree of sulcal volume loss. There is also a somewhat acute callosum angle and some crowding of sulci at the vertex. Vascular: Calcific intracranial atherosclerosis. No hyperdense vessel identified. Skull: No acute fracture. Sinuses/Orbits: Visualized sinuses are clear. Unremarkable visualized orbits. Other: No mastoid effusions. CT CERVICAL SPINE FINDINGS Alignment: No substantial sagittal subluxation. Skull base and vertebrae: Vertebral body heights are maintained. No evidence of acute fracture. Soft tissues and spinal canal: No prevertebral fluid or swelling. No visible canal hematoma. Disc levels: Multilevel facet/uncovertebral hypertrophy with varying degrees of neural foraminal stenosis. Mild multilevel degenerative disc disease. Upper chest: Visualized lung apices are clear on motion limited assessment. Other: Calcific atherosclerosis at bilateral carotid bifurcations. IMPRESSION: CT head: 1. No evidence of acute intracranial abnormality. 2. 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 3. Redemonstrated ventriculomegaly, which is slightly out of proportion  to the degree of sulcal dilation. This could be due to central predominant volume loss; however, there are some findings that can be seen with normal pressure hydrocephalus (NPH). Recommend correlation with the presence or absence of signs/symptoms of NPH. CT cervical spine: No evidence of acute fracture or traumatic malalignment. Electronically Signed   By: Margaretha Sheffield M.D.   On: 05/14/2021 19:15        Scheduled Meds:  aspirin  81 mg Oral Daily   atorvastatin  40 mg Oral Daily   Chlorhexidine Gluconate Cloth  6 each Topical Daily   donepezil  5 mg Oral QHS   enoxaparin (LOVENOX) injection  40 mg Subcutaneous Q24H   gabapentin  300 mg Oral BID   losartan  25 mg Oral Daily   mirabegron ER  25 mg Oral Daily   pantoprazole  40 mg Oral Daily   tamsulosin  0.4 mg Oral Daily   vitamin B-12  1,000 mcg Oral Daily   Continuous Infusions:  sodium chloride 75 mL/hr at 05/16/21 0442   cefTRIAXone (ROCEPHIN)  IV Stopped (05/15/21 1034)     LOS: 2 days    Time spent: 33 mins     Wyvonnia Dusky, MD [facility] Pager [phone]  If 7PM-7AM, please contact night-coverage 05/16/2021, 8:04 AM

## 2021-05-16 NOTE — Progress Notes (Signed)
Eppie Gibson MD notified of positive MRSA results.

## 2021-05-17 ENCOUNTER — Inpatient Hospital Stay: Payer: Medicare (Managed Care)

## 2021-05-17 DIAGNOSIS — R410 Disorientation, unspecified: Secondary | ICD-10-CM

## 2021-05-17 DIAGNOSIS — S2241XA Multiple fractures of ribs, right side, initial encounter for closed fracture: Secondary | ICD-10-CM | POA: Diagnosis not present

## 2021-05-17 DIAGNOSIS — T83518A Infection and inflammatory reaction due to other urinary catheter, initial encounter: Secondary | ICD-10-CM | POA: Diagnosis not present

## 2021-05-17 DIAGNOSIS — N390 Urinary tract infection, site not specified: Secondary | ICD-10-CM | POA: Diagnosis not present

## 2021-05-17 LAB — CBC
HCT: 38.9 % — ABNORMAL LOW (ref 39.0–52.0)
Hemoglobin: 12.5 g/dL — ABNORMAL LOW (ref 13.0–17.0)
MCH: 27.5 pg (ref 26.0–34.0)
MCHC: 32.1 g/dL (ref 30.0–36.0)
MCV: 85.5 fL (ref 80.0–100.0)
Platelets: 213 10*3/uL (ref 150–400)
RBC: 4.55 MIL/uL (ref 4.22–5.81)
RDW: 14.2 % (ref 11.5–15.5)
WBC: 7.5 10*3/uL (ref 4.0–10.5)
nRBC: 0 % (ref 0.0–0.2)

## 2021-05-17 LAB — BASIC METABOLIC PANEL
Anion gap: 7 (ref 5–15)
BUN: 16 mg/dL (ref 8–23)
CO2: 25 mmol/L (ref 22–32)
Calcium: 8.7 mg/dL — ABNORMAL LOW (ref 8.9–10.3)
Chloride: 103 mmol/L (ref 98–111)
Creatinine, Ser: 0.95 mg/dL (ref 0.61–1.24)
GFR, Estimated: >60 mL/min (ref >60)
Glucose, Bld: 98 mg/dL (ref 70–99)
Potassium: 3.9 mmol/L (ref 3.5–5.1)
Sodium: 135 mmol/L (ref 135–145)

## 2021-05-17 LAB — URINE CULTURE: Culture: <10000 — AB

## 2021-05-17 NOTE — Progress Notes (Addendum)
PROGRESS NOTE    Adam Rios.  EVO:[id number] DOB: June 01, 1941 DOA: 05/14/2021 PCP: Dola Argyle, MD   Assessment & Plan:   Principal Problem:   Fall  Generalized weakness: s/p fall at home w/ several rib fractures. PT/OT recs SNF. CM is working on SNF placement   Likely UTI: UA is positive. Urine cx shows containment. Repeat urine cx shows insignificant growth. Continue on IV rocephin    Right wrist pain: secondary to fall at home. XR of right wrist shows no fracture or dislocation  HTN: continue on losartan   HLD: continue on statin    BPH: continue on flomax   Intermittent confusion/metabolic encephalopathy: etiology unclear, delirium vs infection vs mild cognitive impairment vs dementia. Re-orient prn    DVT prophylaxis: lovenox Code Status: Full Family Communication: discussed pt's care w/ pt's daughter, [name], and answered her questions  Disposition Plan: likely d/c to SNF   Level of care: Med-Surg  Status is: Inpatient  Remains inpatient appropriate because: severity of illness    Consultants:    Procedures:   Antimicrobials: rocephin    Subjective: Pt c/o right wrist pain   Objective: Vitals:   05/16/21 1521 05/16/21 1953 05/17/21 0424 05/17/21 0755  BP: 109/66 114/69 125/74 (!) 103/56  Pulse: 73 84 75 72  Resp: 18 18 18 18   Temp: 98.5 F (36.9 C) 98.4 F (36.9 C) 97.9 F (36.6 C) 98 F (36.7 C)  TempSrc: Oral   Oral  SpO2: 93% 94% 94% 94%  Weight:      Height:        Intake/Output Summary (Last 24 hours) at 05/17/2021 0846 Last data filed at 05/17/2021 0400 Gross per 24 hour  Intake 480 ml  Output 1100 ml  Net -620 ml   Filed Weights   05/15/21 1638  Weight: 101.2 kg    Examination:  General exam: Appears comfortable   Respiratory system: clear breath sounds b/l. Cardiovascular system: S1/S2+. No rubs or clicks  Gastrointestinal system: Abd is soft, NT, obese & hypoactive bowel sounds  Central nervous system:   alert and oriented. Moves all extremities  Psychiatry: judgement and insight appear poor. Flat mood and affect     Data Reviewed: I have personally reviewed following labs and imaging studies  CBC: Recent Labs  Lab 05/14/21 1701 05/15/21 0643 05/16/21 0525 05/17/21 0537  WBC 9.3 8.9 7.6 7.5  HGB 13.9 12.4* 12.1* 12.5*  HCT 43.5 38.6* 38.5* 38.9*  MCV 87.7 85.6 87.1 85.5  PLT 275 267 244 299   Basic Metabolic Panel: Recent Labs  Lab 05/14/21 1701 05/15/21 0643 05/16/21 0525 05/17/21 0537  NA 135 135 136 135  K 4.5 4.1 4.0 3.9  CL 101 102 105 103  CO2 25 26 26 25   GLUCOSE 124* 123* 95 98  BUN 21 18 19 16   CREATININE 3.71 0.93 0.98 0.95  CALCIUM 9.2 8.8* 8.8* 8.7*   GFR: Estimated Creatinine Clearance: 77.5 mL/min (by C-G formula based on SCr of 0.95 mg/dL). Liver Function Tests: No results for input(s): AST, ALT, ALKPHOS, BILITOT, PROT, ALBUMIN in the last 168 hours. No results for input(s): LIPASE, AMYLASE in the last 168 hours. No results for input(s): AMMONIA in the last 168 hours. Coagulation Profile: No results for input(s): INR, PROTIME in the last 168 hours. Cardiac Enzymes: No results for input(s): CKTOTAL, CKMB, CKMBINDEX, TROPONINI in the last 168 hours. BNP (last 3 results) No results for input(s): PROBNP in the last 8760 hours. HbA1C: No results  for input(s): HGBA1C in the last 72 hours. CBG: No results for input(s): GLUCAP in the last 168 hours. Lipid Profile: No results for input(s): CHOL, HDL, LDLCALC, TRIG, CHOLHDL, LDLDIRECT in the last 72 hours. Thyroid Function Tests: No results for input(s): TSH, T4TOTAL, FREET4, T3FREE, THYROIDAB in the last 72 hours. Anemia Panel: No results for input(s): VITAMINB12, FOLATE, FERRITIN, TIBC, IRON, RETICCTPCT in the last 72 hours. Sepsis Labs: No results for input(s): PROCALCITON, LATICACIDVEN in the last 168 hours.  Recent Results (from the past 240 hour(s))  Urine Culture     Status: Abnormal    Collection Time: 05/14/21  7:40 PM   Specimen: Urine, Random  Result Value Ref Range Status   Specimen Description   Final    URINE, RANDOM Performed at [facility], [address], [place], [place] [address]    Special Requests   Final    NONE Performed at [facility], [address] [place], [place] [address]    Culture MULTIPLE SPECIES PRESENT, SUGGEST RECOLLECTION (A)  Final   Report Status 05/15/2021 FINAL  Final  Resp Panel by RT-PCR (Flu A&B, Covid) Nasopharyngeal Swab     Status: None   Collection Time: 05/14/21 10:40 PM   Specimen: Nasopharyngeal Swab; Nasopharyngeal(NP) swabs in vial transport medium  Result Value Ref Range Status   SARS Coronavirus 2 by RT PCR NEGATIVE NEGATIVE Final    Comment: (NOTE) SARS-CoV-2 target nucleic acids are NOT DETECTED.  The SARS-CoV-2 RNA is generally detectable in upper respiratory specimens during the acute phase of infection. The lowest concentration of SARS-CoV-2 viral copies this assay can detect is 138 copies/mL. A negative result does not preclude SARS-Cov-2 infection and should not be used as the sole basis for treatment or other patient management decisions. A negative result may occur with  improper specimen collection/handling, submission of specimen other than nasopharyngeal swab, presence of viral mutation(s) within the areas targeted by this assay, and inadequate number of viral copies(<138 copies/mL). A negative result must be combined with clinical observations, patient history, and epidemiological information. The expected result is Negative.  Fact Sheet for Patients:  EntrepreneurPulse.com.au  Fact Sheet for Healthcare Providers:  IncredibleEmployment.be  This test is no t yet approved or cleared by the [place] FDA and  has been authorized for detection and/or diagnosis of SARS-CoV-2 by FDA under an Emergency Use Authorization (EUA). This EUA will  remain  in effect (meaning this test can be used) for the duration of the COVID-19 declaration under Section 564(b)(1) of the Act, 21 U.S.C.section 360bbb-3(b)(1), unless the authorization is terminated  or revoked sooner.       Influenza A by PCR NEGATIVE NEGATIVE Final   Influenza B by PCR NEGATIVE NEGATIVE Final    Comment: (NOTE) The Xpert Xpress SARS-CoV-2/FLU/RSV plus assay is intended as an aid in the diagnosis of influenza from Nasopharyngeal swab specimens and should not be used as a sole basis for treatment. Nasal washings and aspirates are unacceptable for Xpert Xpress SARS-CoV-2/FLU/RSV testing.  Fact Sheet for Patients: EntrepreneurPulse.com.au  Fact Sheet for Healthcare Providers: IncredibleEmployment.be  This test is not yet approved or cleared by the [place] FDA and has been authorized for detection and/or diagnosis of SARS-CoV-2 by FDA under an Emergency Use Authorization (EUA). This EUA will remain in effect (meaning this test can be used) for the duration of the COVID-19 declaration under Section 564(b)(1) of the Act, 21 U.S.C. section 360bbb-3(b)(1), unless the authorization is terminated or revoked.  Performed  at [facility], [address] [place], [place] [address]   MRSA Next Gen by PCR, Nasal     Status: Abnormal   Collection Time: 05/16/21 12:51 PM   Specimen: Nasal Mucosa; Nasal Swab  Result Value Ref Range Status   MRSA by PCR Next Gen DETECTED (A) NOT DETECTED Final    Comment: RESULT CALLED TO, READ BACK BY AND VERIFIED WITH: BERNA MIRAFUENTE AT 1416 05/16/21.PMF (NOTE) The GeneXpert MRSA Assay (FDA approved for NASAL specimens only), is one component of a comprehensive MRSA colonization surveillance program. It is not intended to diagnose MRSA infection nor to guide or monitor treatment for MRSA infections. Test performance is not FDA approved in patients less than 41  years old. Performed at [facility], [address] [place], [place] [address]          Radiology Studies: No results found.      Scheduled Meds:  aspirin  81 mg Oral Daily   atorvastatin  40 mg Oral Daily   Chlorhexidine Gluconate Cloth  6 each Topical Daily   donepezil  5 mg Oral QHS   enoxaparin (LOVENOX) injection  40 mg Subcutaneous Q24H   gabapentin  300 mg Oral BID   losartan  25 mg Oral Daily   mirabegron ER  25 mg Oral Daily   pantoprazole  40 mg Oral Daily   tamsulosin  0.4 mg Oral Daily   vitamin B-12  1,000 mcg Oral Daily   Continuous Infusions:  cefTRIAXone (ROCEPHIN)  IV 2 g (05/16/21 1238)     LOS: 3 days    Time spent: 25 mins     Wyvonnia Dusky, MD [facility] Pager [phone]  If 7PM-7AM, please contact night-coverage 05/17/2021, 8:46 AM

## 2021-05-17 NOTE — TOC Progression Note (Signed)
Transition of Care (TOC) - Progression Note    Patient Details  Name: Adam Rios. MRN: [id number] Date of Birth: 1941-04-25  Transition of [facility]) CM/SW Contact  Candie Chroman, LCSW Phone Number: 05/17/2021, 12:56 PM  Clinical Narrative:  Called daughter and provided bed offers: [facility] in [place]. She does not want him returning to [place] but will research [facility] and [facility]. Will update her if we get any other offers.   Expected Discharge Plan: [place] Barriers to Discharge: Continued Medical Work up  Expected Discharge Plan and Services Expected Discharge Plan: [place]   Discharge Planning Services: CM Consult   Living arrangements for the past 2 months: Single Family Home, [place]                 DME Arranged: N/A DME Agency: NA       HH Arranged: PT HH Agency: [place] ([place]) Date HH Agency Contacted: 05/15/21 Time [place]: 1624 Representative spoke with at [place]: [place] ([place]) Interventions    Readmission Risk Interventions No flowsheet data found.

## 2021-05-17 NOTE — Plan of Care (Signed)
°  Problem: Clinical Measurements: Goal: Will remain free from infection Outcome: Progressing   Problem: Activity: Goal: Risk for activity intolerance will decrease Outcome: Progressing   Problem: Elimination: Goal: Will not experience complications related to bowel motility Outcome: Progressing Goal: Will not experience complications related to urinary retention Outcome: Progressing   Problem: Pain Managment: Goal: General experience of comfort will improve Outcome: Progressing   Problem: Safety: Goal: Ability to remain free from injury will improve Outcome: Progressing   Problem: Skin Integrity: Goal: Risk for impaired skin integrity will decrease Outcome: Progressing

## 2021-05-18 ENCOUNTER — Inpatient Hospital Stay: Payer: Medicare (Managed Care)

## 2021-05-18 DIAGNOSIS — N400 Enlarged prostate without lower urinary tract symptoms: Secondary | ICD-10-CM

## 2021-05-18 DIAGNOSIS — T83518A Infection and inflammatory reaction due to other urinary catheter, initial encounter: Secondary | ICD-10-CM | POA: Diagnosis not present

## 2021-05-18 DIAGNOSIS — N390 Urinary tract infection, site not specified: Secondary | ICD-10-CM | POA: Diagnosis not present

## 2021-05-18 DIAGNOSIS — S2241XA Multiple fractures of ribs, right side, initial encounter for closed fracture: Secondary | ICD-10-CM | POA: Diagnosis not present

## 2021-05-18 LAB — BASIC METABOLIC PANEL
Anion gap: 7 (ref 5–15)
BUN: 18 mg/dL (ref 8–23)
CO2: 23 mmol/L (ref 22–32)
Calcium: 8.6 mg/dL — ABNORMAL LOW (ref 8.9–10.3)
Chloride: 102 mmol/L (ref 98–111)
Creatinine, Ser: 1.04 mg/dL (ref 0.61–1.24)
GFR, Estimated: >60 mL/min (ref >60)
Glucose, Bld: 94 mg/dL (ref 70–99)
Potassium: 3.5 mmol/L (ref 3.5–5.1)
Sodium: 132 mmol/L — ABNORMAL LOW (ref 135–145)

## 2021-05-18 LAB — GLUCOSE, CAPILLARY: Glucose-Capillary: 110 mg/dL — ABNORMAL HIGH (ref 70–99)

## 2021-05-18 LAB — CBC
HCT: 37.7 % — ABNORMAL LOW (ref 39.0–52.0)
Hemoglobin: 12.3 g/dL — ABNORMAL LOW (ref 13.0–17.0)
MCH: 27.8 pg (ref 26.0–34.0)
MCHC: 32.6 g/dL (ref 30.0–36.0)
MCV: 85.1 fL (ref 80.0–100.0)
Platelets: 240 10*3/uL (ref 150–400)
RBC: 4.43 MIL/uL (ref 4.22–5.81)
RDW: 14.2 % (ref 11.5–15.5)
WBC: 9 10*3/uL (ref 4.0–10.5)
nRBC: 0 % (ref 0.0–0.2)

## 2021-05-18 LAB — PROCALCITONIN: Procalcitonin: <0.1 ng/mL

## 2021-05-18 MED ORDER — PIPERACILLIN-TAZOBACTAM IN DEX 2-0.25 GM/50ML IV SOLN: 2.2500 g | Freq: Three times a day (TID) | INTRAVENOUS | Status: DC

## 2021-05-18 MED ORDER — SODIUM CHLORIDE 0.9 % IV SOLN: INTRAVENOUS | Status: DC | PRN

## 2021-05-18 MED ORDER — PIPERACILLIN-TAZOBACTAM 3.375 G IVPB
3.3750 g | Freq: Three times a day (TID) | INTRAVENOUS | Status: DC
  Administered 2021-05-18 – 2021-05-21 (×10): 3.375 g via INTRAVENOUS
  Filled 2021-05-18 (×10): qty 50

## 2021-05-18 MED ORDER — SODIUM CHLORIDE 0.9 % IV SOLN
100.0000 mg | Freq: Two times a day (BID) | INTRAVENOUS | Status: DC
  Administered 2021-05-18 – 2021-05-21 (×7): 100 mg via INTRAVENOUS
  Filled 2021-05-18 (×8): qty 100

## 2021-05-18 NOTE — Progress Notes (Signed)
PROGRESS NOTE    Adam Rios.  NKN:[id number] DOB: 1941-03-25 DOA: 05/14/2021 PCP: Dola Argyle, MD   Assessment & Plan:   Principal Problem:   Fall  Generalized weakness: s/p fall at home w/ several rib fractures. PT/OT recs SNF. CM is working on SNF placement   Likely UTI: UA is positive. Urine cx shows containment. Repeat urine cx shows insignificant growth. Changed abxs to IV doxy & zosyn as pt is spiking fevers.   Fevers: of unknown etiology. Change IV abxs to doxy & zosyn. Repeat blood cxs ordered. Repeat CXR shows no pneumonia   Right wrist pain: secondary to fall at home. XR of right wrist shows no fracture or dislocation  HTN: continue on losartan   HLD:  continue on statin   BPH: continue on flomax   Intermittent confusion/metabolic encephalopathy: etiology unclear, delirium vs infection vs mild cognitive impairment vs dementia. Re-orient prn    DVT prophylaxis: lovenox Code Status: Full Family Communication: discussed pt's care w/ pt's daughter, [name], and answered her questions  Disposition Plan: likely d/c to SNF   Level of care: Med-Surg  Status is: Inpatient  Remains inpatient appropriate because: severity of illness    Consultants:    Procedures:   Antimicrobials: rocephin    Subjective: Pt c/o right wrist pain   Objective: Vitals:   05/17/21 0424 05/17/21 0755 05/17/21 1521 05/17/21 2031  BP: 125/74 (!) 103/56 127/72 129/77  Pulse: 75 72 83 (!) 102  Resp: 18 18 16 20   Temp: 97.9 F (36.6 C) 98 F (36.7 C) 98.7 F (37.1 C) (!) 100.9 F (38.3 C)  TempSrc:  Oral Oral   SpO2: 94% 94% 95% 94%  Weight:      Height:        Intake/Output Summary (Last 24 hours) at 05/18/2021 0805 Last data filed at 05/17/2021 2100 Gross per 24 hour  Intake 240 ml  Output 1350 ml  Net -1110 ml   Filed Weights   05/15/21 1638  Weight: 101.2 kg    Examination:  General exam: Appears calm but uncomfortable  Respiratory system: clear  breath sounds b/l  Cardiovascular system: S1 & S2+. No rubs or clicks  Gastrointestinal system: Abd is soft, NT, obese & normal bowel sounds  Central nervous system:  alert and oriented. Moves all extremities  Psychiatry: judgement and insight appears normal. Flat mood and affect     Data Reviewed: I have personally reviewed following labs and imaging studies  CBC: Recent Labs  Lab 05/14/21 1701 05/15/21 0643 05/16/21 0525 05/17/21 0537 05/18/21 0514  WBC 9.3 8.9 7.6 7.5 9.0  HGB 13.9 12.4* 12.1* 12.5* 12.3*  HCT 43.5 38.6* 38.5* 38.9* 37.7*  MCV 87.7 85.6 87.1 85.5 85.1  PLT 275 267 244 213 379   Basic Metabolic Panel: Recent Labs  Lab 05/14/21 1701 05/15/21 0643 05/16/21 0525 05/17/21 0537 05/18/21 0514  NA 135 135 136 135 132*  K 4.5 4.1 4.0 3.9 3.5  CL 101 102 105 103 102  CO2 25 26 26 25 23   GLUCOSE 124* 123* 95 98 94  BUN 21 18 19 16 18   CREATININE 1.12 0.93 0.98 0.95 1.04  CALCIUM 9.2 8.8* 8.8* 8.7* 8.6*   GFR: Estimated Creatinine Clearance: 70.8 mL/min (by C-G formula based on SCr of 1.04 mg/dL). Liver Function Tests: No results for input(s): AST, ALT, ALKPHOS, BILITOT, PROT, ALBUMIN in the last 168 hours. No results for input(s): LIPASE, AMYLASE in the last 168 hours. No results for  input(s): AMMONIA in the last 168 hours. Coagulation Profile: No results for input(s): INR, PROTIME in the last 168 hours. Cardiac Enzymes: No results for input(s): CKTOTAL, CKMB, CKMBINDEX, TROPONINI in the last 168 hours. BNP (last 3 results) No results for input(s): PROBNP in the last 8760 hours. HbA1C: No results for input(s): HGBA1C in the last 72 hours. CBG: No results for input(s): GLUCAP in the last 168 hours. Lipid Profile: No results for input(s): CHOL, HDL, LDLCALC, TRIG, CHOLHDL, LDLDIRECT in the last 72 hours. Thyroid Function Tests: No results for input(s): TSH, T4TOTAL, FREET4, T3FREE, THYROIDAB in the last 72 hours. Anemia Panel: No results for  input(s): VITAMINB12, FOLATE, FERRITIN, TIBC, IRON, RETICCTPCT in the last 72 hours. Sepsis Labs: No results for input(s): PROCALCITON, LATICACIDVEN in the last 168 hours.  Recent Results (from the past 240 hour(s))  Urine Culture     Status: Abnormal   Collection Time: 05/14/21  7:40 PM   Specimen: Urine, Random  Result Value Ref Range Status   Specimen Description   Final    URINE, RANDOM Performed at [facility], [address], [place], [place] [address]    Special Requests   Final    NONE Performed at [facility], [address] [place], [place] [address]    Culture MULTIPLE SPECIES PRESENT, SUGGEST RECOLLECTION (A)  Final   Report Status 05/15/2021 FINAL  Final  Resp Panel by RT-PCR (Flu A&B, Covid) Nasopharyngeal Swab     Status: None   Collection Time: 05/14/21 10:40 PM   Specimen: Nasopharyngeal Swab; Nasopharyngeal(NP) swabs in vial transport medium  Result Value Ref Range Status   SARS Coronavirus 2 by RT PCR NEGATIVE NEGATIVE Final    Comment: (NOTE) SARS-CoV-2 target nucleic acids are NOT DETECTED.  The SARS-CoV-2 RNA is generally detectable in upper respiratory specimens during the acute phase of infection. The lowest concentration of SARS-CoV-2 viral copies this assay can detect is 138 copies/mL. A negative result does not preclude SARS-Cov-2 infection and should not be used as the sole basis for treatment or other patient management decisions. A negative result may occur with  improper specimen collection/handling, submission of specimen other than nasopharyngeal swab, presence of viral mutation(s) within the areas targeted by this assay, and inadequate number of viral copies(<138 copies/mL). A negative result must be combined with clinical observations, patient history, and epidemiological information. The expected result is Negative.  Fact Sheet for Patients:  EntrepreneurPulse.com.au  Fact Sheet for Healthcare  Providers:  IncredibleEmployment.be  This test is no t yet approved or cleared by the [place] FDA and  has been authorized for detection and/or diagnosis of SARS-CoV-2 by FDA under an Emergency Use Authorization (EUA). This EUA will remain  in effect (meaning this test can be used) for the duration of the COVID-19 declaration under Section 564(b)(1) of the Act, 21 U.S.C.section 360bbb-3(b)(1), unless the authorization is terminated  or revoked sooner.       Influenza A by PCR NEGATIVE NEGATIVE Final   Influenza B by PCR NEGATIVE NEGATIVE Final    Comment: (NOTE) The Xpert Xpress SARS-CoV-2/FLU/RSV plus assay is intended as an aid in the diagnosis of influenza from Nasopharyngeal swab specimens and should not be used as a sole basis for treatment. Nasal washings and aspirates are unacceptable for Xpert Xpress SARS-CoV-2/FLU/RSV testing.  Fact Sheet for Patients: EntrepreneurPulse.com.au  Fact Sheet for Healthcare Providers: IncredibleEmployment.be  This test is not yet approved or cleared by the [place] and has been authorized  for detection and/or diagnosis of SARS-CoV-2 by FDA under an Emergency Use Authorization (EUA). This EUA will remain in effect (meaning this test can be used) for the duration of the COVID-19 declaration under Section 564(b)(1) of the Act, 21 U.S.C. section 360bbb-3(b)(1), unless the authorization is terminated or revoked.  Performed at [facility], [address] [place], [place] [address]   Urine Culture     Status: Abnormal   Collection Time: 05/16/21  8:30 AM   Specimen: Urine, Random  Result Value Ref Range Status   Specimen Description   Final    URINE, RANDOM Performed at [facility], [address], [place], [place] [address]    Special Requests   Final    NONE Performed at [facility], [address] [place], [place] [address]     Culture (A)  Final    <10,000 COLONIES/mL INSIGNIFICANT GROWTH Performed at [facility], [address], [place], [place] [address]    Report Status 05/17/2021 FINAL  Final  MRSA Next Gen by PCR, Nasal     Status: Abnormal   Collection Time: 05/16/21 12:51 PM   Specimen: Nasal Mucosa; Nasal Swab  Result Value Ref Range Status   MRSA by PCR Next Gen DETECTED (A) NOT DETECTED Final    Comment: RESULT CALLED TO, READ BACK BY AND VERIFIED WITH: BERNA MIRAFUENTE AT 1416 05/16/21.PMF (NOTE) The GeneXpert MRSA Assay (FDA approved for NASAL specimens only), is one component of a comprehensive MRSA colonization surveillance program. It is not intended to diagnose MRSA infection nor to guide or monitor treatment for MRSA infections. Test performance is not FDA approved in patients less than 15 years old. Performed at [facility], [address] [place],  [address]          Radiology Studies: DG Wrist Complete Right  Result Date: 05/17/2021 CLINICAL DATA:  Fall 1 week ago, pain along ulnar aspect of right wrist EXAM: RIGHT WRIST - COMPLETE 3+ VIEW COMPARISON:  None. FINDINGS: There is no acute fracture or dislocation. Bony alignment is normal. The joint spaces are preserved. There is no erosive change. The soft tissues are unremarkable. IMPRESSION: No acute fracture or dislocation. Electronically Signed   By: Valetta Mole M.D.   On: 05/17/2021 11:24        Scheduled Meds:  aspirin  81 mg Oral Daily   atorvastatin  40 mg Oral Daily   Chlorhexidine Gluconate Cloth  6 each Topical Daily   donepezil  5 mg Oral QHS   enoxaparin (LOVENOX) injection  40 mg Subcutaneous Q24H   gabapentin  300 mg Oral BID   losartan  25 mg Oral Daily   mirabegron ER  25 mg Oral Daily   pantoprazole  40 mg Oral Daily   tamsulosin  0.4 mg Oral Daily   vitamin B-12  1,000 mcg Oral Daily   Continuous Infusions:  cefTRIAXone (ROCEPHIN)  IV Stopped (05/17/21 1728)     LOS: 4 days     Time spent: 25 mins     Wyvonnia Dusky, MD [facility] Pager [phone]  If 7PM-7AM, please contact night-coverage 05/18/2021, 8:05 AM

## 2021-05-19 DIAGNOSIS — M25531 Pain in right wrist: Secondary | ICD-10-CM | POA: Diagnosis not present

## 2021-05-19 DIAGNOSIS — N390 Urinary tract infection, site not specified: Secondary | ICD-10-CM | POA: Diagnosis not present

## 2021-05-19 DIAGNOSIS — T83518A Infection and inflammatory reaction due to other urinary catheter, initial encounter: Secondary | ICD-10-CM | POA: Diagnosis not present

## 2021-05-19 DIAGNOSIS — R531 Weakness: Secondary | ICD-10-CM | POA: Diagnosis not present

## 2021-05-19 LAB — BASIC METABOLIC PANEL
Anion gap: 7 (ref 5–15)
BUN: 25 mg/dL — ABNORMAL HIGH (ref 8–23)
CO2: 26 mmol/L (ref 22–32)
Calcium: 8.7 mg/dL — ABNORMAL LOW (ref 8.9–10.3)
Chloride: 103 mmol/L (ref 98–111)
Creatinine, Ser: 1.16 mg/dL (ref 0.61–1.24)
GFR, Estimated: >60 mL/min (ref >60)
Glucose, Bld: 102 mg/dL — ABNORMAL HIGH (ref 70–99)
Potassium: 3.3 mmol/L — ABNORMAL LOW (ref 3.5–5.1)
Sodium: 136 mmol/L (ref 135–145)

## 2021-05-19 LAB — CBC
HCT: 38.8 % — ABNORMAL LOW (ref 39.0–52.0)
Hemoglobin: 12.5 g/dL — ABNORMAL LOW (ref 13.0–17.0)
MCH: 27.6 pg (ref 26.0–34.0)
MCHC: 32.2 g/dL (ref 30.0–36.0)
MCV: 85.7 fL (ref 80.0–100.0)
Platelets: 221 10*3/uL (ref 150–400)
RBC: 4.53 MIL/uL (ref 4.22–5.81)
RDW: 14.4 % (ref 11.5–15.5)
WBC: 8.3 10*3/uL (ref 4.0–10.5)
nRBC: 0 % (ref 0.0–0.2)

## 2021-05-19 MED ORDER — MUPIROCIN 2 % EX OINT
1.0000 "application " | TOPICAL_OINTMENT | Freq: Two times a day (BID) | CUTANEOUS | Status: DC
  Administered 2021-05-19 – 2021-05-21 (×4): 1 via NASAL
  Filled 2021-05-19: qty 22

## 2021-05-19 MED ORDER — POTASSIUM CHLORIDE CRYS ER 20 MEQ PO TBCR
40.0000 meq | EXTENDED_RELEASE_TABLET | Freq: Once | ORAL | Status: AC
  Administered 2021-05-19: 10:00:00 40 meq via ORAL
  Filled 2021-05-19: qty 2

## 2021-05-19 MED ORDER — TRAMADOL HCL 50 MG PO TABS
50.0000 mg | ORAL_TABLET | Freq: Four times a day (QID) | ORAL | Status: DC | PRN
  Administered 2021-05-20: 12:00:00 50 mg via ORAL
  Filled 2021-05-19: qty 1

## 2021-05-19 NOTE — Progress Notes (Signed)
Occupational Therapy Treatment Patient Details Name: Adam Rios. MRN: [id number] DOB: 20-Feb-1941 Today's Date: 05/19/2021   History of present illness Maury Groninger. is a 80 y.o. Caucasian male with medical history significant for essential hypertension, CVA, neurogenic bladder and renal cell carcinoma status post nephrectomy, cognitive dysfunction, spinal stenosis and diastolic dysfunction, who presented to the ER with acute onset of generalized weakness and inability to walk.   OT comments  Pt seen for OT/PT co-tx today to maximize services d/t low fxl activity tolerance. Pt requires increased time, encouragement and education to agree to OOB Activity. HE requires MA XA +2 with HOB elevated and significant use of draw sheet to reduce pain, to come to EOB sitting. In sitting he demos P static balance requiring MIN A and cues for self-righting, but ultimately mostly external support to sustain static sit. With bilateral foot/knee blocking, he is able to CTS x2 trials from slightly elevated EOB surface. OT provides education about R UE positioning as well as ed re: importance of OOB activity including prevention of atrohpy and opportunistic infection. Pt with moderate reception. Returned to bed end of session with RN tending to pt's IV. All needs met and in reach. Will continue to follow. Continue to anticipate that pt will require STR f/u OT services considering his very significant decline in functional activity tolerance and strength.    Recommendations for follow up therapy are one component of a multi-disciplinary discharge planning process, led by the attending physician.  Recommendations may be updated based on patient status, additional functional criteria and insurance authorization.    Follow Up Recommendations  Skilled nursing-short term rehab (<3 hours/day)    Assistance Recommended at Discharge Frequent or constant Supervision/Assistance  Equipment Recommendations  Other  (comment) (defer)    Recommendations for Other Services      Precautions / Restrictions Precautions Precautions: Fall Restrictions Weight Bearing Restrictions: No       Mobility Bed Mobility Overal bed mobility: Needs Assistance Bed Mobility: Supine to Sit;Sit to Supine     Supine to sit: Max assist;+2 for physical assistance;HOB elevated Sit to supine: Max assist;+2 for physical assistance   General bed mobility comments: increased time, OT cues pt to hold R UE against chest as he c/o wrist pain (imaging negative)    Transfers Overall transfer level: Needs assistance Equipment used: 2 person hand held assist Transfers: Sit to/from Stand Sit to Stand: Mod assist;+2 physical assistance;+2 safety/equipment;From elevated surface           General transfer comment: increased time, foot/knee blocking,. arm in arm technique.     Balance Overall balance assessment: Needs assistance Sitting-balance support: Feet unsupported;Bilateral upper extremity supported Sitting balance-Leahy Scale: Poor Sitting balance - Comments: cues for righting in sitting     Standing balance-Leahy Scale: Zero Standing balance comment: MAX A +2 arm in arm to sustain static stand with LEs bracing on bed                           ADL either performed or assessed with clinical judgement   ADL Overall ADL's : Needs assistance/impaired                                            Extremity/Trunk Assessment  Vision       Perception     Praxis      Cognition Arousal/Alertness: Awake/alert Behavior During Therapy: Flat affect Overall Cognitive Status: Within Functional Limits for tasks assessed                                 General Comments: requires increased time and encouragement, able to follow commands.          Exercises Other Exercises Other Exercises: OT ed re: importance of OOB Activity.   Shoulder Instructions        General Comments      Pertinent Vitals/ Pain       Pain Assessment: Faces Faces Pain Scale: Hurts little more Pain Location: ribs, R wrist Pain Descriptors / Indicators: Guarding;Aching;Grimacing;Moaning Pain Intervention(s): Limited activity within patient's tolerance;Monitored during session;Repositioned  Home Living                                          Prior Functioning/Environment              Frequency  Min 2X/week        Progress Toward Goals  OT Goals(current goals can now be found in the care plan section)  Progress towards OT goals: Progressing toward goals  Acute Rehab OT Goals Patient Stated Goal: to get pain controlled and get better OT Goal Formulation: With patient Time For Goal Achievement: 05/29/21 Potential to Achieve Goals: Good  Plan Discharge plan remains appropriate    Co-evaluation                 AM-PAC OT "6 Clicks" Daily Activity     Outcome Measure   Help from another person eating meals?: A Little Help from another person taking care of personal grooming?: A Little Help from another person toileting, which includes using toliet, bedpan, or urinal?: A Lot Help from another person bathing (including washing, rinsing, drying)?: A Lot Help from another person to put on and taking off regular upper body clothing?: A Little Help from another person to put on and taking off regular lower body clothing?: Total 6 Click Score: 14    End of Session Equipment Utilized During Treatment: Gait belt;Rolling walker (2 wheels)  OT Visit Diagnosis: Unsteadiness on feet (R26.81);History of falling (Z91.81)   Activity Tolerance Patient tolerated treatment well   Patient Left in bed;with call bell/phone within reach;with bed alarm set   Nurse Communication Mobility status        Time: 2284-0698 OT Time Calculation (min): 21 min  Charges: OT General Charges $OT Visit: 1 Visit OT Treatments $Therapeutic  Activity: 8-22 mins  Gerrianne Scale, [place], OTR/L ascom [phone] 05/19/21, 3:20 PM

## 2021-05-19 NOTE — Progress Notes (Signed)
Physical Therapy Treatment Patient Details Name: Adam Rios. MRN: [id number] DOB: 1940-11-15 Today's Date: 05/19/2021   History of Present Illness Adam Rios. is a 80 y.o. Caucasian male with medical history significant for essential hypertension, CVA, neurogenic bladder and renal cell carcinoma status post nephrectomy, cognitive dysfunction, spinal stenosis and diastolic dysfunction, who presented to the ER with acute onset of generalized weakness and inability to walk.    PT Comments    Patient alert, agreeable to PT/OT with encouragement. He did demonstrate some good progress towards goals today, but does remain somewhat self limiting due to generalized pain (back, wrist, L arm/shoulder, ankle, etc). maxAx2 to perform supine <> sit with HOB elevated. With extended time, encouragement and LLE use of bed rail, able to maintain sitting balance with CGA, but predominantly minA due to R lean/lateral lean. Sit <> stand twice with modAX2 via handheld assist with bilateral knee block. Unable to progress to ambulating at this time. Returned to supine with all needs in reach, RN at bedside to assess IV site. Pt also instructed in ankle pumps and isometric exercises to perform during his day. The patient would benefit from further skilled PT intervention to continue to progress towards goals. Recommendation remains appropriate.     Recommendations for follow up therapy are one component of a multi-disciplinary discharge planning process, led by the attending physician.  Recommendations may be updated based on patient status, additional functional criteria and insurance authorization.  Follow Up Recommendations  Skilled nursing-short term rehab (<3 hours/day)     Assistance Recommended at Discharge    Equipment Recommendations  Other (comment) (TBD)    Recommendations for Other Services       Precautions / Restrictions Precautions Precautions: Fall Restrictions Weight Bearing  Restrictions: No     Mobility  Bed Mobility Overal bed mobility: Needs Assistance Bed Mobility: Supine to Sit;Sit to Supine     Supine to sit: Max assist;+2 for physical assistance;HOB elevated Sit to supine: Max assist;+2 for physical assistance   General bed mobility comments: increased time, OT cues pt to hold R UE against chest as he c/o wrist pain (imaging negative)    Transfers Overall transfer level: Needs assistance Equipment used: 2 person hand held assist Transfers: Sit to/from Stand Sit to Stand: Mod assist;+2 physical assistance;+2 safety/equipment;From elevated surface           General transfer comment: increased time, foot/knee blocking,. arm in arm technique.    Ambulation/Gait               General Gait Details: unable at this time   Stairs             Wheelchair Mobility    Modified Rankin (Stroke Patients Only)       Balance Overall balance assessment: Needs assistance Sitting-balance support: Feet unsupported;Bilateral upper extremity supported Sitting balance-Leahy Scale: Poor Sitting balance - Comments: cues for righting in sitting Postural control: Right lateral lean;Posterior lean   Standing balance-Leahy Scale: Zero Standing balance comment: MAX A +2 arm in arm to sustain static stand with LEs bracing on bed                            Cognition Arousal/Alertness: Awake/alert Behavior During Therapy: Flat affect Overall Cognitive Status: Within Functional Limits for tasks assessed  General Comments: requires increased time and encouragement, able to follow commands.        Exercises General Exercises - Lower Extremity Ankle Circles/Pumps: AROM;Strengthening;Both;10 reps Quad Sets: AROM;Strengthening;Both;10 reps Gluteal Sets: AROM;Strengthening;Both;10 reps Other Exercises Other Exercises: OT ed re: importance of OOB Activity.    General Comments         Pertinent Vitals/Pain Pain Assessment: Faces Faces Pain Scale: Hurts little more Pain Location: ribs, R wrist Pain Descriptors / Indicators: Guarding;Aching;Grimacing;Moaning Pain Intervention(s): Limited activity within patient's tolerance;Monitored during session;Repositioned    Home Living                          Prior Function            PT Goals (current goals can now be found in the care plan section) Progress towards PT goals: Progressing toward goals    Frequency    Min 2X/week      PT Plan Current plan remains appropriate    Co-evaluation              AM-PAC PT "6 Clicks" Mobility   Outcome Measure  Help needed turning from your back to your side while in a flat bed without using bedrails?: Total Help needed moving from lying on your back to sitting on the side of a flat bed without using bedrails?: Total Help needed moving to and from a bed to a chair (including a wheelchair)?: Total Help needed standing up from a chair using your arms (e.g., wheelchair or bedside chair)?: Total Help needed to walk in hospital room?: Total Help needed climbing 3-5 steps with a railing? : Total 6 Click Score: 6    End of Session   Activity Tolerance: Patient limited by pain;Patient tolerated treatment well Patient left: in bed;with call bell/phone within reach;with bed alarm set Nurse Communication: Mobility status PT Visit Diagnosis: Other abnormalities of gait and mobility (R26.89);Difficulty in walking, not elsewhere classified (R26.2);Muscle weakness (generalized) (M62.81);Pain Pain - Right/Left: Right Pain - part of body:  (flank due to rib pain)     Time: 2505-3976 PT Time Calculation (min) (ACUTE ONLY): 29 min  Charges:  $Therapeutic Exercise: 8-22 mins                    Lieutenant Diego PT, DPT 3:44 PM,05/19/21

## 2021-05-19 NOTE — TOC Progression Note (Signed)
Transition of Care (TOC) - Progression Note    Patient Details  Name: Adam Rios. MRN: [id number] Date of Birth: [birth year]  Transition of Care [facility]) CM/SW Contact  Beverly Sessions, RN Phone Number: 05/19/2021, 3:57 PM  Clinical Narrative:    Bed offers reviewed with daughter with [name]. She is to review with patient over night and provide TOC with her decision tomorrow    Expected Discharge Plan: [place] Barriers to Discharge: Continued Medical Work up  Expected Discharge Plan and Services Expected Discharge Plan: [place]   Discharge Planning Services: CM Consult   Living arrangements for the past 2 months: Single Family Home, [place]                 DME Arranged: N/A DME Agency: NA       HH Arranged: PT [place] Agency: [place] ([place]) Date HH Agency Contacted: 05/15/21 Time [place]: 1624 Representative spoke with at [place]: [place] ([place]) Interventions    Readmission Risk Interventions No flowsheet data found.

## 2021-05-19 NOTE — Progress Notes (Signed)
PROGRESS NOTE    [name].  HUT:[id number] DOB: [birth year] DOA: 05/14/2021 PCP: Dola Argyle, MD   Assessment & Plan:   Principal Problem:   Fall  Generalized weakness: s/p fall at home w/ several rib fractures. PT/OT recs SNF. Still working on SNF placement   Likely UTI: UA is positive. Urine cx shows containment. Repeat urine cx shows insignificant growth. Continue on IV doxy, zosyn.   Fevers: of unknown etiology. Continue on IV doxy, zosyn. Repeat blood cx NGTD. CXR shows no pneumonia. Afebrile for at least 24 hours   Right wrist pain: secondary to fall at home. XR of wrist shows no fracture or dislocation. Tylenol, tramadol prn for pain   HTN: continue on ARB   HLD:  continue on statin    BPH: continue on flomax    Intermittent confusion/metabolic encephalopathy: improved today. Etiology unclear, delirium vs infection vs mild cognitive impairment vs dementia. Re-orient prn    DVT prophylaxis: lovenox Code Status: Full Family Communication: discussed pt's care w/ pt's daughter, [name], and answered her questions  Disposition Plan: likely d/c to SNF   Level of care: Med-Surg  Status is: Inpatient  Remains inpatient appropriate because: severity of illness    Consultants:    Procedures:   Antimicrobials: rocephin    Subjective: Pt c/o fatigue   Objective: Vitals:   05/18/21 0825 05/18/21 0832 05/18/21 1539 05/18/21 2107  BP: (!) 87/56 101/65 123/71 (!) 110/58  Pulse: 72 71 88 75  Resp: 20  18 19   Temp: 98.8 F (37.1 C)  98.4 F (36.9 C) 98.3 F (36.8 C)  TempSrc: Oral  Oral   SpO2: 96%  93% 93%  Weight:      Height:        Intake/Output Summary (Last 24 hours) at 05/19/2021 0759 Last data filed at 05/19/2021 0554 Gross per 24 hour  Intake 678.21 ml  Output 1200 ml  Net -521.79 ml   Filed Weights   05/15/21 1638  Weight: 101.2 kg    Examination:  General exam: Appears calm & comfortable  Respiratory system: clear breath  sounds b/l. No rales, wheezes  Cardiovascular system: S1/S2+. No rubs or clicks  Gastrointestinal system: Abd is soft, NT, obese & normal bowel sounds  Central nervous system:  alert and oriented. Moves all extremities  Psychiatry: judgement and insight appears poor. Flat mood and affect     Data Reviewed: I have personally reviewed following labs and imaging studies  CBC: Recent Labs  Lab 05/15/21 0643 05/16/21 0525 05/17/21 0537 05/18/21 0514 05/19/21 0313  WBC 8.9 7.6 7.5 9.0 8.3  HGB 12.4* 12.1* 12.5* 12.3* 12.5*  HCT 38.6* 38.5* 38.9* 37.7* 38.8*  MCV 85.6 87.1 85.5 85.1 85.7  PLT 267 244 213 240 656   Basic Metabolic Panel: Recent Labs  Lab 05/15/21 0643 05/16/21 0525 05/17/21 0537 05/18/21 0514 05/19/21 0313  NA 135 136 135 132* 136  K 4.1 4.0 3.9 3.5 3.3*  CL 102 105 103 102 103  CO2 26 26 25 23 26   GLUCOSE 123* 95 98 94 102*  BUN 18 19 16 18  25*  CREATININE 0.93 0.98 0.95 1.04 1.16  CALCIUM 8.8* 8.8* 8.7* 8.6* 8.7*   GFR: Estimated Creatinine Clearance: 63.5 mL/min (by C-G formula based on SCr of 1.16 mg/dL). Liver Function Tests: No results for input(s): AST, ALT, ALKPHOS, BILITOT, PROT, ALBUMIN in the last 168 hours. No results for input(s): LIPASE, AMYLASE in the last 168 hours. No results for input(s):  AMMONIA in the last 168 hours. Coagulation Profile: No results for input(s): INR, PROTIME in the last 168 hours. Cardiac Enzymes: No results for input(s): CKTOTAL, CKMB, CKMBINDEX, TROPONINI in the last 168 hours. BNP (last 3 results) No results for input(s): PROBNP in the last 8760 hours. HbA1C: No results for input(s): HGBA1C in the last 72 hours. CBG: Recent Labs  Lab 05/18/21 0824  GLUCAP 110*   Lipid Profile: No results for input(s): CHOL, HDL, LDLCALC, TRIG, CHOLHDL, LDLDIRECT in the last 72 hours. Thyroid Function Tests: No results for input(s): TSH, T4TOTAL, FREET4, T3FREE, THYROIDAB in the last 72 hours. Anemia Panel: No results  for input(s): VITAMINB12, FOLATE, FERRITIN, TIBC, IRON, RETICCTPCT in the last 72 hours. Sepsis Labs: Recent Labs  Lab 05/18/21 8756  PROCALCITON <0.10    Recent Results (from the past 240 hour(s))  Urine Culture     Status: Abnormal   Collection Time: 05/14/21  7:40 PM   Specimen: Urine, Random  Result Value Ref Range Status   Specimen Description   Final    URINE, RANDOM Performed at [facility], [address] [place], [place] [address]    Special Requests   Final    NONE Performed at [facility] [address] [place], [place] [address]    Culture MULTIPLE SPECIES PRESENT, SUGGEST RECOLLECTION (A)  Final   Report Status 05/15/2021 FINAL  Final  Resp Panel by RT-PCR (Flu A&B, Covid) Nasopharyngeal Swab     Status: None   Collection Time: 05/14/21 10:40 PM   Specimen: Nasopharyngeal Swab; Nasopharyngeal(NP) swabs in vial transport medium  Result Value Ref Range Status   SARS Coronavirus 2 by RT PCR NEGATIVE NEGATIVE Final    Comment: (NOTE) SARS-CoV-2 target nucleic acids are NOT DETECTED.  The SARS-CoV-2 RNA is generally detectable in upper respiratory specimens during the acute phase of infection. The lowest concentration of SARS-CoV-2 viral copies this assay can detect is 138 copies/mL. A negative result does not preclude SARS-Cov-2 infection and should not be used as the sole basis for treatment or other patient management decisions. A negative result may occur with  improper specimen collection/handling, submission of specimen other than nasopharyngeal swab, presence of viral mutation(s) within the areas targeted by this assay, and inadequate number of viral copies(<138 copies/mL). A negative result must be combined with clinical observations, patient history, and epidemiological information. The expected result is Negative.  Fact Sheet for Patients:  EntrepreneurPulse.com.au  Fact Sheet for Healthcare Providers:   IncredibleEmployment.be  This test is no t yet approved or cleared by the [place] FDA and  has been authorized for detection and/or diagnosis of SARS-CoV-2 by FDA under an Emergency Use Authorization (EUA). This EUA will remain  in effect (meaning this test can be used) for the duration of the COVID-19 declaration under Section 564(b)(1) of the Act, 21 U.S.C.section 360bbb-3(b)(1), unless the authorization is terminated  or revoked sooner.       Influenza A by PCR NEGATIVE NEGATIVE Final   Influenza B by PCR NEGATIVE NEGATIVE Final    Comment: (NOTE) The Xpert Xpress SARS-CoV-2/FLU/RSV plus assay is intended as an aid in the diagnosis of influenza from Nasopharyngeal swab specimens and should not be used as a sole basis for treatment. Nasal washings and aspirates are unacceptable for Xpert Xpress SARS-CoV-2/FLU/RSV testing.  Fact Sheet for Patients: EntrepreneurPulse.com.au  Fact Sheet for Healthcare Providers: IncredibleEmployment.be  This test is not yet approved or cleared by the [place] and has been authorized  for detection and/or diagnosis of SARS-CoV-2 by FDA under an Emergency Use Authorization (EUA). This EUA will remain in effect (meaning this test can be used) for the duration of the COVID-19 declaration under Section 564(b)(1) of the Act, 21 U.S.C. section 360bbb-3(b)(1), unless the authorization is terminated or revoked.  Performed at [facility], [address], [place], [place] [address]   Urine Culture     Status: Abnormal   Collection Time: 05/16/21  8:30 AM   Specimen: Urine, Random  Result Value Ref Range Status   Specimen Description   Final    URINE, RANDOM Performed at [facility], [address], [place], [place] [address]    Special Requests   Final    NONE Performed at [facility], [address] [place], [place] [address]    Culture (A)   Final    <10,000 COLONIES/mL INSIGNIFICANT GROWTH Performed at [facility], [place] [address], [place], [place] [address]    Report Status 05/17/2021 FINAL  Final  MRSA Next Gen by PCR, Nasal     Status: Abnormal   Collection Time: 05/16/21 12:51 PM   Specimen: Nasal Mucosa; Nasal Swab  Result Value Ref Range Status   MRSA by PCR Next Gen DETECTED (A) NOT DETECTED Final    Comment: RESULT CALLED TO, READ BACK BY AND VERIFIED WITH: BERNA MIRAFUENTE AT 1416 05/16/21.PMF (NOTE) The GeneXpert MRSA Assay (FDA approved for NASAL specimens only), is one component of a comprehensive MRSA colonization surveillance program. It is not intended to diagnose MRSA infection nor to guide or monitor treatment for MRSA infections. Test performance is not FDA approved in patients less than 36 years old. Performed at [facility], [address] [place], [place] [address]   CULTURE, BLOOD (ROUTINE X 2) w Reflex to ID Panel     Status: None (Preliminary result)   Collection Time: 05/18/21  9:21 AM   Specimen: BLOOD  Result Value Ref Range Status   Specimen Description BLOOD LEFT ANTECUBITAL  Final   Special Requests   Final    BOTTLES DRAWN AEROBIC AND ANAEROBIC Blood Culture adequate volume   Culture   Final    NO GROWTH < 24 HOURS Performed at [facility], [address] [place], [place] [address]    Report Status PENDING  Incomplete  CULTURE, BLOOD (ROUTINE X 2) w Reflex to ID Panel     Status: None (Preliminary result)   Collection Time: 05/18/21  9:21 AM   Specimen: BLOOD  Result Value Ref Range Status   Specimen Description BLOOD BLOOD LEFT HAND  Final   Special Requests   Final    BOTTLES DRAWN AEROBIC AND ANAEROBIC Blood Culture adequate volume   Culture   Final    NO GROWTH < 24 HOURS Performed at [facility], [address] [place], [place] [address]    Report Status PENDING  Incomplete         Radiology Studies: DG Wrist Complete  Right  Result Date: 05/17/2021 CLINICAL DATA:  Fall 1 week ago, pain along ulnar aspect of right wrist EXAM: RIGHT WRIST - COMPLETE 3+ VIEW COMPARISON:  None. FINDINGS: There is no acute fracture or dislocation. Bony alignment is normal. The joint spaces are preserved. There is no erosive change. The soft tissues are unremarkable. IMPRESSION: No acute fracture or dislocation. Electronically Signed   By: Valetta Mole M.D.   On: 05/17/2021 11:24   DG Chest [facility]  Result Date: 05/18/2021 CLINICAL DATA:  Generalized weakness, status post fall EXAM: PORTABLE CHEST 1 VIEW COMPARISON:  Chest radiograph dated May 14, 2021 FINDINGS: The heart is mildly enlarged. Atherosclerotic calcification of the aortic arch. Low lung volumes with prominence of the pulmonary vessels. Multiple bilateral calcified lymph nodes, likely sequela of prior infectious/inflammatory process. No large pleural effusion or pneumothorax. IMPRESSION: No acute cardiopulmonary process. Electronically Signed   By: Keane Police D.O.   On: 05/18/2021 09:43        Scheduled Meds:  aspirin  81 mg Oral Daily   atorvastatin  40 mg Oral Daily   Chlorhexidine Gluconate Cloth  6 each Topical Daily   donepezil  5 mg Oral QHS   enoxaparin (LOVENOX) injection  40 mg Subcutaneous Q24H   gabapentin  300 mg Oral BID   losartan  25 mg Oral Daily   mirabegron ER  25 mg Oral Daily   pantoprazole  40 mg Oral Daily   tamsulosin  0.4 mg Oral Daily   vitamin B-12  1,000 mcg Oral Daily   Continuous Infusions:  sodium chloride Stopped (05/19/21 0553)   doxycycline (VIBRAMYCIN) IV Stopped (05/19/21 0310)   piperacillin-tazobactam (ZOSYN)  IV 12.5 mL/hr at 05/19/21 0554     LOS: 5 days    Time spent: 20 mins     Wyvonnia Dusky, MD [facility] Pager [phone]  If 7PM-7AM, please contact night-coverage 05/19/2021, 7:59 AM

## 2021-05-19 NOTE — Evaluation (Signed)
Clinical/Bedside Swallow Evaluation Patient Details  Name: Adam Rios. MRN: [id number] Date of Birth: 23-Jun-1940  Today's Date: 05/19/2021 Time: SLP Start Time (ACUTE ONLY): 9702 SLP Stop Time (ACUTE ONLY): 0830 SLP Time Calculation (min) (ACUTE ONLY): 18 min  Past Medical History:  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[place])    Hypertension    Neurogenic bladder    Renal cell carcinoma ([place]) 2005   Renal disorder    Stroke ([place])     01/17/18, 12/21   Some weakness and cognitive decline   Past Surgical History:  Past Surgical History:  Procedure Laterality Date   APPENDECTOMY     CATARACT EXTRACTION W/PHACO Right 07/22/2020   Procedure: CATARACT EXTRACTION PHACO AND INTRAOCULAR LENS PLACEMENT ([place]) RIGHT;  Surgeon: Eulogio Bear, MD;  Location: [facility];  Service: Ophthalmology;  Laterality: Right;  4.77 0:44.2   CATARACT EXTRACTION W/PHACO Left 08/05/2020   Procedure: CATARACT EXTRACTION PHACO AND INTRAOCULAR LENS PLACEMENT ([facility]) LEFT 10.73 01:13.8;  Surgeon: Eulogio Bear, MD;  Location: [facility];  Service: Ophthalmology;  Laterality: Left;  Use eye stretcher not chair   HERNIA REPAIR     LUMBAR FUSION     NEPHRECTOMY Left 2005   HPI:  Per 76 H&P "Adam Rios. is a 80 y.o. Caucasian male with medical history significant for essential hypertension, CVA, neurogenic bladder and renal cell carcinoma status post nephrectomy, cognitive dysfunction, spinal stenosis and diastolic dysfunction, who presented to the ER with acute onset of generalized weakness and inability to walk.  He was admitted at [facility] but a month ago due to intractable back pain and deconditioning with UTI.  He had a mechanical accidental fall 2 days ago on the grass in front of his house.  He had subsequent injury to his abdomen and  chest and subsequent right-sided lower chest pain.  No presyncope or syncope, paresthesias or focal muscle weakness or head injury.  He has an indwelling Foley catheter which has been there for the last couple weeks.  No fever or chills.  He got out of rehabilitation and has been with his daughter and son-in-law for the last few days.  The patient's Foley catheter was replaced in the ER.     ED Course: When he came to the ER, vital signs were within normal.  Labs revealed normal CBC.  BNP was 56.3 and BMP was within normal.  UA was positive for UTI.  EKG as reviewed by me : EKG showed sinus tachycardia with rate 1 1 with premature atrial complexes, left axis deviation, low voltage QRS and inferior Q waves as well as anterolateral Q waves.  Imaging: Head CT without contrast revealed no evidence for acute intracranial normality.  It showed encephalomalacia/gliosis from prior occipital hemorrhage.  It redemonstrated ventriculomegaly that is slightly out of proportion to the degree of sulcal dilatation and it could be due to central predominant volume loss however there are some findings that can be seen with normal pressure hydrocephalus.  C-spine CT showed no fracture or traumatic malalignment.  Two-view chest x-ray showed no acute cardiopulmonary disease.  Abdominal pelvic CT scan revealed the following:  1. Right lateral eighth through tenth rib fractures, consistent with  recent trauma.  2. No other acute intra-abdominal or intrapelvic trauma on this exam  limited by the lack of IV contrast.  3. Marked fecal retention consistent with constipation.  4. Marked enlargement of the prostate.  5. Eventration of the right hemidiaphragm.  6.  Aortic Atherosclerosis    The patient was given a gram of IV Rocephin, 1 L bolus of IV lactated Ringer, 5 mg of p.o. oxycodone and 1 g of p.o. Tylenol.  He will be admitted to a medical bed for further evaluation and management"    Assessment / Plan / Recommendation  Clinical  Impression  Pt seen for clinical swallowing evaluation. Pt alert and cooperative. Tearful at times. Required redirection. Denies dysphagia. Pt known to SLP services from previous admission in 2019 with recommendation for a mech soft diet and thin liquids (aspiration precautions as outlined in note). Cleared with RN.  Pt presents with s/sx mild oral dysphagia c/b mildly prolonged mastication of dry solid likely due to dental status. Oral efficiency improved when solid was moistened. X1 delayed cough with dry solid which was not present when moistened. Oral phase was functional across other textures and no overt s/sx pharyngeal dysphagia across other trials. To palpation, pt with seemingly adequate laryngeal elevation and seemingly timely swallow initiation. No change to vocal quality across trials. With set up, pt able to feed self throughout evaluation.   Recommend mech soft diet with thin liquids and safe swallowing strategies/aspiration precautions as outlined below. Recommend moistening solids with addition of extra gravies, sauces, and condiments. Meds whole with puree (vs crushed).   Pt is at mildly increased risk of aspiration/aspiration given dental status, mental status, and multiple medical comorbidites.  SLP to f/u per POC for diet tolerance and trials of upgraded textures.   Pt and RN made aware of results, recommendations, and SLP POC. Pt verbalized understanding; however, suspect need for reinforcement of content given documented cognitive impairment.   SLP Visit Diagnosis: Dysphagia, oropharyngeal phase (R13.12)    Aspiration Risk  Mild aspiration risk    Diet Recommendation Dysphagia 3 (Mech soft);Thin liquid   Medication Administration: Whole meds with puree (consider crushing, if needed) Supervision: Patient able to self feed;Intermittent supervision to cue for compensatory strategies (set up assistance) Compensations: Minimize environmental distractions;Slow rate;Small  sips/bites;Follow solids with liquid Postural Changes: Seated upright at 90 degrees;Remain upright for at least 30 minutes after po intake    Other  Recommendations Oral Care Recommendations: Oral care BID    Recommendations for follow up therapy are one component of a multi-disciplinary discharge planning process, led by the attending physician.  Recommendations may be updated based on patient status, additional functional criteria and insurance authorization.  Follow up Recommendations Acute inpatient rehab (3hours/day)      Assistance Recommended at Discharge Frequent or constant Supervision/Assistance  Functional Status Assessment Patient has had a recent decline in their functional status and demonstrates the ability to make significant improvements in function in a reasonable and predictable amount of time.  Frequency and Duration min 2x/week  2 weeks       Prognosis Prognosis for Safe Diet Advancement: Fair Barriers to Reach Goals: Cognitive deficits      Swallow Study   General Date of Onset: 05/14/21 HPI: Per Physician's H&P "Adam Rios. is a 80 y.o. Caucasian male with medical history significant for essential hypertension, CVA, neurogenic bladder and renal cell carcinoma status post nephrectomy, cognitive dysfunction, spinal stenosis and diastolic dysfunction, who presented to the ER with acute onset of generalized weakness and inability to walk.  He was admitted at [facility] but a month ago due to intractable  back pain and deconditioning with UTI.  He had a mechanical accidental fall 2 days ago on the grass in front of his house.  He had subsequent injury to his abdomen and chest and subsequent right-sided lower chest pain.  No presyncope or syncope, paresthesias or focal muscle weakness or head injury.  He has an indwelling Foley catheter which has been there for the last couple weeks.  No fever or chills.  He got out of rehabilitation and has been with his daughter  and son-in-law for the last few days.  The patient's Foley catheter was replaced in the ER.     ED Course: When he came to the ER, vital signs were within normal.  Labs revealed normal CBC.  BNP was 56.3 and BMP was within normal.  UA was positive for UTI.  EKG as reviewed by me : EKG showed sinus tachycardia with rate 1 1 with premature atrial complexes, left axis deviation, low voltage QRS and inferior Q waves as well as anterolateral Q waves.  Imaging: Head CT without contrast revealed no evidence for acute intracranial normality.  It showed encephalomalacia/gliosis from prior occipital hemorrhage.  It redemonstrated ventriculomegaly that is slightly out of proportion to the degree of sulcal dilatation and it could be due to central predominant volume loss however there are some findings that can be seen with normal pressure hydrocephalus.  C-spine CT showed no fracture or traumatic malalignment.  Two-view chest x-ray showed no acute cardiopulmonary disease.  Abdominal pelvic CT scan revealed the following:  1. Right lateral eighth through tenth rib fractures, consistent with  recent trauma.  2. No other acute intra-abdominal or intrapelvic trauma on this exam  limited by the lack of IV contrast.  3. Marked fecal retention consistent with constipation.  4. Marked enlargement of the prostate.  5. Eventration of the right hemidiaphragm.  6.  Aortic Atherosclerosis    The patient was given a gram of IV Rocephin, 1 L bolus of IV lactated Ringer, 5 mg of p.o. oxycodone and 1 g of p.o. Tylenol.  He will be admitted to a medical bed for further evaluation and management" Type of Study: Bedside Swallow Evaluation Previous Swallow Assessment: clinical swallowing evaluation completed in 2019 with recommendation for mech soft/thin Diet Prior to this Study: Regular;Thin liquids Respiratory Status: Room air History of Recent Intubation: Yes Behavior/Cognition: Alert;Cooperative;Confused;Requires cueing (tearful) Oral  Cavity Assessment: Within Functional Limits Oral Cavity - Dentition: Missing dentition;Poor condition Self-Feeding Abilities: Able to feed self;Needs set up Patient Positioning: Upright in bed Baseline Vocal Quality: Normal Volitional Cough: Strong Volitional Swallow: Able to elicit    Oral/Motor/Sensory Function Overall Oral Motor/Sensory Function: Within functional limits   Thin Liquid Thin Liquid: Within functional limits Presentation: Self Fed;Cup;Straw Other Comments: ~4 oz    Puree Puree: Within functional limits Presentation: Self Fed Other Comments: ~2 oz   Solid     Solid: Impaired (dry and moistened) Presentation: Self Fed Oral Phase Impairments: Impaired mastication Oral Phase Functional Implications: Impaired mastication Pharyngeal Phase Impairments: Throat Clearing - Delayed (no throat clearing with moistened solid)     Cherrie Gauze, M.S., [facility] [phone] (ASCOM)  Adam Rios 05/19/2021,8:46 AM

## 2021-05-19 NOTE — TOC Progression Note (Deleted)
Transition of Care (TOC) - Progression Note    Patient Details  Name: Adam Rios. MRN: [id number] Date of Birth: 1940-12-25  Transition of Care [facility]) CM/SW Contact  Beverly Sessions, RN Phone Number: 05/19/2021, 4:18 PM  Clinical Narrative:    Insurance auth obtained.  Repeat covid negative Per facility can admit tomorrow.  MD and Rn notified     Expected Discharge Plan: [place] Barriers to Discharge: Continued Medical Work up  Expected Discharge Plan and Services Expected Discharge Plan: [place]   Discharge Planning Services: CM Consult   Living arrangements for the past 2 months: [place], [place]                 DME Arranged: N/A DME Agency: NA       HH Arranged: PT HH Agency: [facility] ([place]) Date HH Agency Contacted: 05/15/21 Time [place]: 1624 Representative spoke with at [place]: [place] ([place]) Interventions    Readmission Risk Interventions No flowsheet data found.

## 2021-05-20 DIAGNOSIS — N390 Urinary tract infection, site not specified: Secondary | ICD-10-CM | POA: Diagnosis not present

## 2021-05-20 DIAGNOSIS — T83518A Infection and inflammatory reaction due to other urinary catheter, initial encounter: Secondary | ICD-10-CM | POA: Diagnosis not present

## 2021-05-20 DIAGNOSIS — I10 Essential (primary) hypertension: Secondary | ICD-10-CM | POA: Diagnosis not present

## 2021-05-20 DIAGNOSIS — R531 Weakness: Secondary | ICD-10-CM | POA: Diagnosis not present

## 2021-05-20 LAB — BASIC METABOLIC PANEL
Anion gap: 9 (ref 5–15)
BUN: 18 mg/dL (ref 8–23)
CO2: 23 mmol/L (ref 22–32)
Calcium: 8.7 mg/dL — ABNORMAL LOW (ref 8.9–10.3)
Chloride: 105 mmol/L (ref 98–111)
Creatinine, Ser: 0.89 mg/dL (ref 0.61–1.24)
GFR, Estimated: >60 mL/min (ref >60)
Glucose, Bld: 107 mg/dL — ABNORMAL HIGH (ref 70–99)
Potassium: 3.5 mmol/L (ref 3.5–5.1)
Sodium: 137 mmol/L (ref 135–145)

## 2021-05-20 LAB — CBC
HCT: 37.6 % — ABNORMAL LOW (ref 39.0–52.0)
Hemoglobin: 12.4 g/dL — ABNORMAL LOW (ref 13.0–17.0)
MCH: 28.1 pg (ref 26.0–34.0)
MCHC: 33 g/dL (ref 30.0–36.0)
MCV: 85.1 fL (ref 80.0–100.0)
Platelets: 228 10*3/uL (ref 150–400)
RBC: 4.42 MIL/uL (ref 4.22–5.81)
RDW: 14.1 % (ref 11.5–15.5)
WBC: 7.7 10*3/uL (ref 4.0–10.5)
nRBC: 0 % (ref 0.0–0.2)

## 2021-05-20 MED ORDER — POTASSIUM CHLORIDE CRYS ER 20 MEQ PO TBCR: 20.0000 meq | EXTENDED_RELEASE_TABLET | Freq: Once | ORAL | Status: DC

## 2021-05-20 MED ORDER — ALUM & MAG HYDROXIDE-SIMETH 200-200-20 MG/5ML PO SUSP
30.0000 mL | Freq: Four times a day (QID) | ORAL | Status: DC | PRN
  Administered 2021-05-20: 03:00:00 30 mL via ORAL
  Filled 2021-05-20: qty 30

## 2021-05-20 NOTE — TOC Progression Note (Signed)
Transition of Care (TOC) - Progression Note    Patient Details  Name: Koal Eslinger. MRN: [id number] Date of Birth: 1940-09-07  Transition of Care [facility]) CM/SW Contact  Beverly Sessions, RN Phone Number: 05/20/2021, 3:05 PM  Clinical Narrative:    Followed up with bed offers with patient and daughter.  [facility] accepted.  Notified Ebony Hail at [facility]. She is to start auth   Expected Discharge Plan: [place] Barriers to Discharge: Continued Medical Work up  Expected Discharge Plan and Services Expected Discharge Plan: [place]   Discharge Planning Services: CM Consult   Living arrangements for the past 2 months: Single Family Home, [place]                 DME Arranged: N/A DME Agency: NA       HH Arranged: PT HH Agency: [facility] ([place]) Date HH Agency Contacted: 05/15/21 Time [place]: 1624 Representative spoke with at [place]: [place] ([place]) Interventions    Readmission Risk Interventions No flowsheet data found.

## 2021-05-20 NOTE — Progress Notes (Signed)
SLP Follow up Note  Patient Details Name: Adam Rios. MRN: [id number] DOB: [birth year]  Current diet appears appropriate for pt at this time. Pt's nurse reports consumption with no overt s/s of aspiration or dysphagia. Will defer further diet advancement to next venue of care.    Luda Charbonneau B. Rutherford Nail M.S., CCC-SLP, [place] Office [phone]  Stormy Fabian 05/20/2021, 2:46 PM

## 2021-05-20 NOTE — Progress Notes (Signed)
PROGRESS NOTE   HPI was taken from Dr. Sidney Ace: Adam Locket. is a 80 y.o. Caucasian male with medical history significant for essential hypertension, CVA, neurogenic bladder and renal cell carcinoma status post nephrectomy, cognitive dysfunction, spinal stenosis and diastolic dysfunction, who presented to the ER with acute onset of generalized weakness and inability to walk.  He was admitted at [facility] but a month ago due to intractable back pain and deconditioning with UTI.  He had a mechanical accidental fall 2 days ago on the grass in front of his house.  He had subsequent injury to his abdomen and chest and subsequent right-sided lower chest pain.  No presyncope or syncope, paresthesias or focal muscle weakness or head injury.  He has an indwelling Foley catheter which has been there for the last couple weeks.  No fever or chills.  He got out of rehabilitation and has been with his daughter and son-in-law for the last few days.  The patient's Foley catheter was replaced in the ER.   ED Course: When he came to the ER, vital signs were within normal.  Labs revealed normal CBC.  BNP was 56.3 and BMP was within normal.  UA was positive for UTI. EKG as reviewed by me : EKG showed sinus tachycardia with rate 1 1 with premature atrial complexes, left axis deviation, low voltage QRS and inferior Q waves as well as anterolateral Q waves. Imaging: Head CT without contrast revealed no evidence for acute intracranial normality.  It showed encephalomalacia/gliosis from prior occipital hemorrhage.  It redemonstrated ventriculomegaly that is slightly out of proportion to the degree of sulcal dilatation and it could be due to central predominant volume loss however there are some findings that can be seen with normal pressure hydrocephalus.  C-spine CT showed no fracture or traumatic malalignment.  Two-view chest x-ray showed no acute cardiopulmonary disease. Abdominal pelvic CT scan revealed the  following: 1. Right lateral eighth through tenth rib fractures, consistent with recent trauma. 2. No other acute intra-abdominal or intrapelvic trauma on this exam limited by the lack of IV contrast. 3. Marked fecal retention consistent with constipation. 4. Marked enlargement of the prostate. 5. Eventration of the right hemidiaphragm. 6.  Aortic Atherosclerosis  The patient was given a gram of IV Rocephin, 1 L bolus of IV lactated Ringer, 5 mg of p.o. oxycodone and 1 g of p.o. Tylenol.  He will be admitted to a medical bed for further evaluation and management  Hospital course from Dr. Jimmye Norman 12/15-12/20/22: Pt presented w/ generalized weakness and likely UTI. Urine cx initially showed containment and repeat cx showed insignificant growth. Pt is on IV doxy, zosyn x 7 days total (day #6/7). Abxs were broaden as pt was spiking fevers of unknown etiology. PT/OT evaluated pt and recommends SNF.     Adam Locket.  HAL:[id number] DOB: 07/28/1940 DOA: 05/14/2021 PCP: Dola Argyle, MD   Assessment & Plan:   Principal Problem:   Fall  Generalized weakness: s/p fall at home w/ several rib fractures. PT/OT recs SNF.  Waiting on insurance auth   Likely UTI: UA is positive. Urine cx shows containment. Repeat urine cx shows insignificant growth. Continue on IV zosyn, doxy x 7 days total (day #6/7)  Fevers: of unknown etiology. Continue on IV doxy, zosyn. Repeat blood cx NGTD. CXR shows no pneumonia. Afebrile x 48 hrs   Right wrist pain: secondary to fall at home. XR of wrist shows no fracture or dislocation. Tylenol, tramadol prn  for pain   HTN: continue on ARB  HLD:  continue on statin   BPH: continue on flomax   Intermittent confusion/metabolic encephalopathy: improving. Etiology unclear, delirium vs infection vs mild cognitive impairment vs dementia. Re-orient prn    DVT prophylaxis: lovenox Code Status: Full Family Communication: discussed pt's care w/ pt's daughter, [name],  and answered her questions  Disposition Plan: likely d/c to SNF   Level of care: Med-Surg  Status is: Inpatient  Remains inpatient appropriate because: waiting on insurance auth to be d/c to SNF     Consultants:    Procedures:   Antimicrobials: doxy, zosyn   Subjective: Pt c/o malaise  Objective: Vitals:   05/19/21 1547 05/19/21 1955 05/20/21 0506 05/20/21 0856  BP: 121/76 119/70 124/71 112/74  Pulse: (!) 110 89 80 80  Resp: 18 20 18 19   Temp: (!) 97.5 F (36.4 C) 98.6 F (37 C) 99 F (37.2 C) (!) 97 F (36.1 C)  TempSrc:      SpO2: 94% 94% 92% 95%  Weight:      Height:        Intake/Output Summary (Last 24 hours) at 05/20/2021 1500 Last data filed at 05/20/2021 0906 Gross per 24 hour  Intake 300 ml  Output 650 ml  Net -350 ml   Filed Weights   05/15/21 1638  Weight: 101.2 kg    Examination:  General exam: Appears upset and tearful  Respiratory system: decreased breath sounds b/l  Cardiovascular system:S1 & S2+. No rubs or clicks  Gastrointestinal system: Abd is soft, NT, obese & normal bowel sounds  Central nervous system:  alert and oriented. Moves all extremities  Psychiatry: judgement and insight appears normal. Tearful mood    Data Reviewed: I have personally reviewed following labs and imaging studies  CBC: Recent Labs  Lab 05/16/21 0525 05/17/21 0537 05/18/21 0514 05/19/21 0313 05/20/21 0734  WBC 7.6 7.5 9.0 8.3 7.7  HGB 12.1* 12.5* 12.3* 12.5* 12.4*  HCT 38.5* 38.9* 37.7* 38.8* 37.6*  MCV 87.1 85.5 85.1 85.7 85.1  PLT 244 213 240 221 481   Basic Metabolic Panel: Recent Labs  Lab 05/16/21 0525 05/17/21 0537 05/18/21 0514 05/19/21 0313 05/20/21 0734  NA 136 135 132* 136 137  K 4.0 3.9 3.5 3.3* 3.5  CL 105 103 102 103 105  CO2 26 25 23 26 23   GLUCOSE 95 98 94 102* 107*  BUN 19 16 18  25* 18  CREATININE 0.98 0.95 1.04 1.16 0.89  CALCIUM 8.8* 8.7* 8.6* 8.7* 8.7*   GFR: Estimated Creatinine Clearance: 82.8 mL/min (by C-G  formula based on SCr of 0.89 mg/dL). Liver Function Tests: No results for input(s): AST, ALT, ALKPHOS, BILITOT, PROT, ALBUMIN in the last 168 hours. No results for input(s): LIPASE, AMYLASE in the last 168 hours. No results for input(s): AMMONIA in the last 168 hours. Coagulation Profile: No results for input(s): INR, PROTIME in the last 168 hours. Cardiac Enzymes: No results for input(s): CKTOTAL, CKMB, CKMBINDEX, TROPONINI in the last 168 hours. BNP (last 3 results) No results for input(s): PROBNP in the last 8760 hours. HbA1C: No results for input(s): HGBA1C in the last 72 hours. CBG: Recent Labs  Lab 05/18/21 0824  GLUCAP 110*   Lipid Profile: No results for input(s): CHOL, HDL, LDLCALC, TRIG, CHOLHDL, LDLDIRECT in the last 72 hours. Thyroid Function Tests: No results for input(s): TSH, T4TOTAL, FREET4, T3FREE, THYROIDAB in the last 72 hours. Anemia Panel: No results for input(s): VITAMINB12, FOLATE, FERRITIN, TIBC, IRON,  RETICCTPCT in the last 72 hours. Sepsis Labs: Recent Labs  Lab 05/18/21 3151  PROCALCITON <0.10    Recent Results (from the past 240 hour(s))  Urine Culture     Status: Abnormal   Collection Time: 05/14/21  7:40 PM   Specimen: Urine, Random  Result Value Ref Range Status   Specimen Description   Final    URINE, RANDOM Performed at [facility], [address], [place], [place] [address]    Special Requests   Final    NONE Performed at [facility], [address] [place], [place] [address]    Culture MULTIPLE SPECIES PRESENT, SUGGEST RECOLLECTION (A)  Final   Report Status 05/15/2021 FINAL  Final  Resp Panel by RT-PCR (Flu A&B, Covid) Nasopharyngeal Swab     Status: None   Collection Time: 05/14/21 10:40 PM   Specimen: Nasopharyngeal Swab; Nasopharyngeal(NP) swabs in vial transport medium  Result Value Ref Range Status   SARS Coronavirus 2 by RT PCR NEGATIVE NEGATIVE Final    Comment: (NOTE) SARS-CoV-2 target nucleic  acids are NOT DETECTED.  The SARS-CoV-2 RNA is generally detectable in upper respiratory specimens during the acute phase of infection. The lowest concentration of SARS-CoV-2 viral copies this assay can detect is 138 copies/mL. A negative result does not preclude SARS-Cov-2 infection and should not be used as the sole basis for treatment or other patient management decisions. A negative result may occur with  improper specimen collection/handling, submission of specimen other than nasopharyngeal swab, presence of viral mutation(s) within the areas targeted by this assay, and inadequate number of viral copies(<138 copies/mL). A negative result must be combined with clinical observations, patient history, and epidemiological information. The expected result is Negative.  Fact Sheet for Patients:  EntrepreneurPulse.com.au  Fact Sheet for Healthcare Providers:  IncredibleEmployment.be  This test is no t yet approved or cleared by the [place] FDA and  has been authorized for detection and/or diagnosis of SARS-CoV-2 by FDA under an Emergency Use Authorization (EUA). This EUA will remain  in effect (meaning this test can be used) for the duration of the COVID-19 declaration under Section 564(b)(1) of the Act, 21 U.S.C.section 360bbb-3(b)(1), unless the authorization is terminated  or revoked sooner.       Influenza A by PCR NEGATIVE NEGATIVE Final   Influenza B by PCR NEGATIVE NEGATIVE Final    Comment: (NOTE) The Xpert Xpress SARS-CoV-2/FLU/RSV plus assay is intended as an aid in the diagnosis of influenza from Nasopharyngeal swab specimens and should not be used as a sole basis for treatment. Nasal washings and aspirates are unacceptable for Xpert Xpress SARS-CoV-2/FLU/RSV testing.  Fact Sheet for Patients: EntrepreneurPulse.com.au  Fact Sheet for Healthcare Providers: IncredibleEmployment.be  This  test is not yet approved or cleared by the [place] FDA and has been authorized for detection and/or diagnosis of SARS-CoV-2 by FDA under an Emergency Use Authorization (EUA). This EUA will remain in effect (meaning this test can be used) for the duration of the COVID-19 declaration under Section 564(b)(1) of the Act, 21 U.S.C. section 360bbb-3(b)(1), unless the authorization is terminated or revoked.  Performed at [facility], [address], [place], [place] [address]   Urine Culture     Status: Abnormal   Collection Time: 05/16/21  8:30 AM   Specimen: Urine, Random  Result Value Ref Range Status   Specimen Description   Final    URINE, RANDOM Performed at [facility], [address], [place], [place] [address]  Special Requests   Final    NONE Performed at [facility], [address] [place], [place] [address]    Culture (A)  Final    <10,000 COLONIES/mL INSIGNIFICANT GROWTH Performed at [facility] [address] [place], [place] [address]    Report Status 05/17/2021 FINAL  Final  MRSA Next Gen by PCR, Nasal     Status: Abnormal   Collection Time: 05/16/21 12:51 PM   Specimen: Nasal Mucosa; Nasal Swab  Result Value Ref Range Status   MRSA by PCR Next Gen DETECTED (A) NOT DETECTED Final    Comment: RESULT CALLED TO, READ BACK BY AND VERIFIED WITH: BERNA MIRAFUENTE AT 1416 05/16/21.PMF (NOTE) The GeneXpert MRSA Assay (FDA approved for NASAL specimens only), is one component of a comprehensive MRSA colonization surveillance program. It is not intended to diagnose MRSA infection nor to guide or monitor treatment for MRSA infections. Test performance is not FDA approved in patients less than 47 years old. Performed at [facility], [address] [place], [place] [address]   CULTURE, BLOOD (ROUTINE X 2) w Reflex to ID Panel     Status: None (Preliminary result)   Collection Time: 05/18/21  9:21 AM   Specimen:  BLOOD  Result Value Ref Range Status   Specimen Description BLOOD LEFT ANTECUBITAL  Final   Special Requests   Final    BOTTLES DRAWN AEROBIC AND ANAEROBIC Blood Culture adequate volume   Culture   Final    NO GROWTH 2 DAYS Performed at [facility], [address] [place], [place] [address]    Report Status PENDING  Incomplete  CULTURE, BLOOD (ROUTINE X 2) w Reflex to ID Panel     Status: None (Preliminary result)   Collection Time: 05/18/21  9:21 AM   Specimen: BLOOD  Result Value Ref Range Status   Specimen Description BLOOD BLOOD LEFT HAND  Final   Special Requests   Final    BOTTLES DRAWN AEROBIC AND ANAEROBIC Blood Culture adequate volume   Culture   Final    NO GROWTH 2 DAYS Performed at [facility], [address], [place], [place] [address]    Report Status PENDING  Incomplete         Radiology Studies: No results found.      Scheduled Meds:  aspirin  81 mg Oral Daily   atorvastatin  40 mg Oral Daily   Chlorhexidine Gluconate Cloth  6 each Topical Daily   donepezil  5 mg Oral QHS   enoxaparin (LOVENOX) injection  40 mg Subcutaneous Q24H   gabapentin  300 mg Oral BID   losartan  25 mg Oral Daily   mirabegron ER  25 mg Oral Daily   mupirocin ointment  1 application Nasal BID   pantoprazole  40 mg Oral Daily   tamsulosin  0.4 mg Oral Daily   vitamin B-12  1,000 mcg Oral Daily   Continuous Infusions:  sodium chloride Stopped (05/19/21 0553)   doxycycline (VIBRAMYCIN) IV 100 mg (05/20/21 1210)   piperacillin-tazobactam (ZOSYN)  IV 3.375 g (05/20/21 1411)     LOS: 6 days    Time spent: 15 mins     Wyvonnia Dusky, MD [facility] Pager [phone]  If 7PM-7AM, please contact night-coverage 05/20/2021, 3:00 PM

## 2021-05-21 DIAGNOSIS — W19XXXA Unspecified fall, initial encounter: Secondary | ICD-10-CM | POA: Diagnosis not present

## 2021-05-21 DIAGNOSIS — R531 Weakness: Secondary | ICD-10-CM | POA: Diagnosis not present

## 2021-05-21 MED ORDER — TIZANIDINE HCL 4 MG PO TABS
4.0000 mg | ORAL_TABLET | Freq: Four times a day (QID) | ORAL | 0 refills | Status: DC | PRN
Start: 2021-05-21 — End: 2021-11-28

## 2021-05-21 MED ORDER — OMEPRAZOLE 20 MG PO CPDR
20.0000 mg | DELAYED_RELEASE_CAPSULE | Freq: Every day | ORAL | Status: DC
Start: 2021-05-21 — End: 2022-10-13

## 2021-05-21 NOTE — Care Management Important Message (Signed)
Important Message  Patient Details  Name: Rajan Burgard. MRN: [id number] Date of Birth: [birth year]   Medicare Important Message Given:  Yes  Reviewed Medicare IM with daughter, Kesler Wickham, at [phone].  Copy of Medicare IM sent securely to daughter's attention at [email or web address].     [name] 05/21/2021, 11:32 AM

## 2021-05-21 NOTE — TOC Transition Note (Signed)
Transition of Care [facility]) - CM/SW Discharge Note   Patient Details  Name: Adam Rios. MRN: [id number] Date of Birth: July 07, 1940  [facility] CM/SW Contact:  Beverly Sessions, RN Phone Number: 05/21/2021, 1:42 PM   Clinical Narrative:    Notified by Mitchell Heir at [facility] that she has obtained authorization and can accept patient today  Patient will DC MM:[id number] rehab Anticipated DC date: 05/21/21  Family notified:Daughter ashley Transport CH:[id number]  Per MD patient ready for DC to . RN, patient, patient's family, and facility notified of DC. Discharge Summary sent to facility. RN given number for report. DC packet on chart. Ambulance transport requested for patient.  TOC signing off.  Isaias Cowman [facility] [phone]   Final next level of care: Skilled Nursing Facility Barriers to Discharge: Barriers Resolved   Patient Goals and CMS Choice Patient states their goals for this hospitalization and ongoing recovery are:: Patient wants to get back to where he can walk and take care of himself      Discharge Placement                       Discharge Plan and Services   Discharge Planning Services: CM Consult            DME Arranged: N/A DME Agency: NA       HH Arranged: PT [place] Agency: [place] ([place]) Date HH Agency Contacted: 05/15/21 Time [place]: [id number] Representative spoke with at [place]: [place] ([place]) Interventions     Readmission Risk Interventions No flowsheet data found.

## 2021-05-21 NOTE — Discharge Summary (Signed)
Physician Discharge Summary  Adam Rios. VVO:[id number] DOB: 12/21/1940 DOA: 05/14/2021  PCP: Dola Argyle, MD  Admit date: 05/14/2021 Discharge date: 05/21/2021  Discharge disposition: SNF   Recommendations for Outpatient Follow-Up:   Outpatient follow-up with physician at the nursing home within 3 days of discharge.   Discharge Diagnosis:   Principal Problem:   Fall    Discharge Condition: Stable.  Diet recommendation:  Diet Order             Diet - low sodium heart healthy           DIET DYS 3 Room service appropriate? Yes; Fluid consistency: Thin  Diet effective now                     Code Status: Full Code     Hospital Course:   [name]. is an 80 year old man with medical history significant for hypertension, stroke, neurogenic bladder with indwelling Foley catheter, renal cell carcinoma s/p nephrectomy, cognitive dysfunction, spinal stenosis, diastolic dysfunction, who presented to the emergency room with generalized weakness, inability to walk and a fall.  Prior to admission, he had been recently discharged from rehabilitation center and had been living with his daughter and son-in-law.  He was admitted to the hospital for generalized weakness, fall and suspected UTI.  He was treated with empiric IV antibiotics.  However, urine culture did not show any growth.  His Foley catheter was replaced in the emergency room.  He had intermittent confusion the hospital and this was suspected to be due to acute metabolic encephalopathy.  He was also found to have right lateral eighth through 10th rib fractures from recent fall.  He was evaluated by PT and OT recommended further rehabilitation at the skilled nursing facility.  His condition has improved and is deemed stable for discharge to SNF today.      Discharge Exam:    Vitals:   05/20/21 1525 05/20/21 2031 05/21/21 0522 05/21/21 0900  BP: 119/71 137/82 131/73 (!) 151/87  Pulse: 80  77 64 85  Resp: 19 20 20 18   Temp: 97.6 F (36.4 C) 98.5 F (36.9 C) 98.8 F (37.1 C) 98.5 F (36.9 C)  TempSrc:      SpO2: 93% 94% 95% 93%  Weight:      Height:         GEN: NAD SKIN: Warm and dry EYES: No pallor or icterus ENT: MMM CV: RRR PULM: CTA B ABD: soft, ND, NT, +BS CNS: AAO x 3, non focal EXT: No edema or tenderness GU: Foley catheter draining amber urine   The results of significant diagnostics from this hospitalization (including imaging, microbiology, ancillary and laboratory) are listed below for reference.     Procedures and Diagnostic Studies:   CT ABDOMEN PELVIS WO CONTRAST  Result Date: 05/14/2021 CLINICAL DATA:  [place] last weekend, abdominal pain, constipation EXAM: CT ABDOMEN AND PELVIS WITHOUT CONTRAST TECHNIQUE: Multidetector CT imaging of the abdomen and pelvis was performed following the standard protocol without IV contrast. Unenhanced CT was performed per clinician order. Lack of IV contrast limits sensitivity and specificity, especially for evaluation of abdominal/pelvic solid viscera. COMPARISON:  01/13/2010 FINDINGS: Lower chest: Hypoventilatory changes are seen at the lung bases. Hepatobiliary: 3.7 cm left lobe liver cyst identified. Otherwise unremarkable unenhanced appearance of the liver and gallbladder. 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 Pancreas: Unremarkable unenhanced appearance. Spleen: Unremarkable unenhanced appearance. Adrenals/Urinary Tract: Left nephrectomy.  No right-sided renal calculi or obstructive uropathy. Bladder is decompressed by Foley catheter. The adrenals are unremarkable. Stomach/Bowel: No bowel obstruction or ileus. There is a large amount of retained stool throughout the colon consistent with constipation. No bowel wall thickening or inflammatory change. The appendix appears surgically absent. Vascular/Lymphatic: Aortic atherosclerosis. No enlarged abdominal or pelvic lymph  nodes. Reproductive: Marked enlargement of the prostate, measuring 8.1 x 7.5 cm. Other: No free intraperitoneal fluid or free gas. No abdominal wall hernia. Musculoskeletal: There are displaced right lateral eighth through tenth rib fractures, compatible with history of recent trauma. No other acute bony abnormalities. Reconstructed images demonstrate no additional findings. IMPRESSION: 1. Right lateral eighth through tenth rib fractures, consistent with recent trauma. 2. No other acute intra-abdominal or intrapelvic trauma on this exam limited by the lack of IV contrast. 3. Marked fecal retention consistent with constipation. 4. Marked enlargement of the prostate. 5. Eventration of the right hemidiaphragm. 6.  Aortic Atherosclerosis (ICD10-I70.0). Electronically Signed   By: Randa Ngo M.D.   On: 05/14/2021 21:30   DG Chest 2 View  Result Date: 05/14/2021 CLINICAL DATA:  Weakness, fall EXAM: CHEST - 2 VIEW COMPARISON:  01/29/2018 FINDINGS: Low lung volumes. Eventration of the right diaphragm. No focal consolidation. No pleural effusion or pneumothorax. The heart is normal in size.  Thoracic aortic atherosclerosis. Calcified mediastinal nodes, chronic. Visualized osseous structures are within normal limits. IMPRESSION: No evidence of acute cardiopulmonary disease. Electronically Signed   By: Julian Hy M.D.   On: 05/14/2021 19:35   CT HEAD WO CONTRAST  Result Date: 05/14/2021 CLINICAL DATA:  fall EXAM: CT HEAD WITHOUT CONTRAST CT CERVICAL SPINE WITHOUT CONTRAST TECHNIQUE: Multidetector CT imaging of the head and cervical spine was performed following the standard protocol without intravenous contrast. Multiplanar CT image reconstructions of the cervical spine were also generated. COMPARISON:  CT head 01/29/2017. FINDINGS: CT HEAD FINDINGS Brain: Hypodensity in the right occipital lobe, likely encephalomalacia/gliosis from the hemorrhage seen on prior CT from 2018. No evidence of acute large  vascular territory infarct, acute hemorrhage, mass lesion, or midline shift. Generalized atrophy. Redemonstrated ventriculomegaly which is mildly out of proportion to the degree of sulcal volume loss. There is also a somewhat acute callosum angle and some crowding of sulci at the vertex. Vascular: Calcific intracranial atherosclerosis. No hyperdense vessel identified. Skull: No acute fracture. Sinuses/Orbits: Visualized sinuses are clear. Unremarkable visualized orbits. Other: No mastoid effusions. CT CERVICAL SPINE FINDINGS Alignment: No substantial sagittal subluxation. Skull base and vertebrae: Vertebral body heights are maintained. No evidence of acute fracture. Soft tissues and spinal canal: No prevertebral fluid or swelling. No visible canal hematoma. Disc levels: Multilevel facet/uncovertebral hypertrophy with varying degrees of neural foraminal stenosis. Mild multilevel degenerative disc disease. Upper chest: Visualized lung apices are clear on motion limited assessment. Other: Calcific atherosclerosis at bilateral carotid bifurcations. IMPRESSION: CT head: 1. No evidence of acute intracranial abnormality. 2. 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 3. Redemonstrated ventriculomegaly, which is slightly out of proportion to the degree of sulcal dilation. This could be due to central predominant volume loss; however, there are some findings that can be seen with normal pressure hydrocephalus (NPH). Recommend correlation with the presence or absence of signs/symptoms of NPH. CT cervical spine: No evidence of acute fracture or traumatic malalignment. Electronically Signed   By: Margaretha Sheffield M.D.   On: 05/14/2021 19:15   CT Cervical Spine Wo Contrast  Result Date: 05/14/2021 CLINICAL DATA:  fall EXAM: CT HEAD WITHOUT CONTRAST CT CERVICAL  SPINE WITHOUT CONTRAST TECHNIQUE: Multidetector CT imaging of the head and cervical spine was performed following the standard protocol without  intravenous contrast. Multiplanar CT image reconstructions of the cervical spine were also generated. COMPARISON:  CT head 01/29/2017. FINDINGS: CT HEAD FINDINGS Brain: Hypodensity in the right occipital lobe, likely encephalomalacia/gliosis from the hemorrhage seen on prior CT from 2018. No evidence of acute large vascular territory infarct, acute hemorrhage, mass lesion, or midline shift. Generalized atrophy. Redemonstrated ventriculomegaly which is mildly out of proportion to the degree of sulcal volume loss. There is also a somewhat acute callosum angle and some crowding of sulci at the vertex. Vascular: Calcific intracranial atherosclerosis. No hyperdense vessel identified. Skull: No acute fracture. Sinuses/Orbits: Visualized sinuses are clear. Unremarkable visualized orbits. Other: No mastoid effusions. CT CERVICAL SPINE FINDINGS Alignment: No substantial sagittal subluxation. Skull base and vertebrae: Vertebral body heights are maintained. No evidence of acute fracture. Soft tissues and spinal canal: No prevertebral fluid or swelling. No visible canal hematoma. Disc levels: Multilevel facet/uncovertebral hypertrophy with varying degrees of neural foraminal stenosis. Mild multilevel degenerative disc disease. Upper chest: Visualized lung apices are clear on motion limited assessment. Other: Calcific atherosclerosis at bilateral carotid bifurcations. IMPRESSION: CT head: 1. No evidence of acute intracranial abnormality. 2. 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 3. Redemonstrated ventriculomegaly, which is slightly out of proportion to the degree of sulcal dilation. This could be due to central predominant volume loss; however, there are some findings that can be seen with normal pressure hydrocephalus (NPH). Recommend correlation with the presence or absence of signs/symptoms of NPH. CT cervical spine: No evidence of acute fracture or traumatic malalignment. Electronically Signed   By:  Margaretha Sheffield M.D.   On: 05/14/2021 19:15     Labs:   Basic Metabolic Panel: Recent Labs  Lab 05/16/21 0525 05/17/21 0537 05/18/21 0514 05/19/21 0313 05/20/21 0734  NA 136 135 132* 136 137  K 4.0 3.9 3.5 3.3* 3.5  CL 105 103 102 103 105  CO2 26 25 23 26 23   GLUCOSE 95 98 94 102* 107*  BUN 19 16 18  25* 18  CREATININE 0.98 0.95 1.04 1.16 0.89  CALCIUM 8.8* 8.7* 8.6* 8.7* 8.7*   GFR Estimated Creatinine Clearance: 82.8 mL/min (by C-G formula based on SCr of 0.89 mg/dL). Liver Function Tests: No results for input(s): AST, ALT, ALKPHOS, BILITOT, PROT, ALBUMIN in the last 168 hours. No results for input(s): LIPASE, AMYLASE in the last 168 hours. No results for input(s): AMMONIA in the last 168 hours. Coagulation profile No results for input(s): INR, PROTIME in the last 168 hours.  CBC: Recent Labs  Lab 05/16/21 0525 05/17/21 0537 05/18/21 0514 05/19/21 0313 05/20/21 0734  WBC 7.6 7.5 9.0 8.3 7.7  HGB 12.1* 12.5* 12.3* 12.5* 12.4*  HCT 38.5* 38.9* 37.7* 38.8* 37.6*  MCV 87.1 85.5 85.1 85.7 85.1  PLT 244 213 240 221 228   Cardiac Enzymes: No results for input(s): CKTOTAL, CKMB, CKMBINDEX, TROPONINI in the last 168 hours. BNP: Invalid input(s): POCBNP CBG: Recent Labs  Lab 05/18/21 0824  GLUCAP 110*   D-Dimer No results for input(s): DDIMER in the last 72 hours. Hgb A1c No results for input(s): HGBA1C in the last 72 hours. Lipid Profile No results for input(s): CHOL, HDL, LDLCALC, TRIG, CHOLHDL, LDLDIRECT in the last 72 hours. Thyroid function studies No results for input(s): TSH, T4TOTAL, T3FREE, THYROIDAB in the last 72 hours.  Invalid input(s): FREET3 Anemia work up No results for input(s): VITAMINB12, FOLATE, FERRITIN, TIBC,  IRON, RETICCTPCT in the last 72 hours. Microbiology Recent Results (from the past 240 hour(s))  Urine Culture     Status: Abnormal   Collection Time: 05/14/21  7:40 PM   Specimen: Urine, Random  Result Value Ref Range Status    Specimen Description   Final    URINE, RANDOM Performed at [facility], [address], [place], [place] [address]    Special Requests   Final    NONE Performed at [facility], [address] [place], [place] [address]    Culture MULTIPLE SPECIES PRESENT, SUGGEST RECOLLECTION (A)  Final   Report Status 05/15/2021 FINAL  Final  Resp Panel by RT-PCR (Flu A&B, Covid) Nasopharyngeal Swab     Status: None   Collection Time: 05/14/21 10:40 PM   Specimen: Nasopharyngeal Swab; Nasopharyngeal(NP) swabs in vial transport medium  Result Value Ref Range Status   SARS Coronavirus 2 by RT PCR NEGATIVE NEGATIVE Final    Comment: (NOTE) SARS-CoV-2 target nucleic acids are NOT DETECTED.  The SARS-CoV-2 RNA is generally detectable in upper respiratory specimens during the acute phase of infection. The lowest concentration of SARS-CoV-2 viral copies this assay can detect is 138 copies/mL. A negative result does not preclude SARS-Cov-2 infection and should not be used as the sole basis for treatment or other patient management decisions. A negative result may occur with  improper specimen collection/handling, submission of specimen other than nasopharyngeal swab, presence of viral mutation(s) within the areas targeted by this assay, and inadequate number of viral copies(<138 copies/mL). A negative result must be combined with clinical observations, patient history, and epidemiological information. The expected result is Negative.  Fact Sheet for Patients:  EntrepreneurPulse.com.au  Fact Sheet for Healthcare Providers:  IncredibleEmployment.be  This test is no t yet approved or cleared by the [place] FDA and  has been authorized for detection and/or diagnosis of SARS-CoV-2 by FDA under an Emergency Use Authorization (EUA). This EUA will remain  in effect (meaning this test can be used) for the duration of the COVID-19 declaration  under Section 564(b)(1) of the Act, 21 U.S.C.section 360bbb-3(b)(1), unless the authorization is terminated  or revoked sooner.       Influenza A by PCR NEGATIVE NEGATIVE Final   Influenza B by PCR NEGATIVE NEGATIVE Final    Comment: (NOTE) The Xpert Xpress SARS-CoV-2/FLU/RSV plus assay is intended as an aid in the diagnosis of influenza from Nasopharyngeal swab specimens and should not be used as a sole basis for treatment. Nasal washings and aspirates are unacceptable for Xpert Xpress SARS-CoV-2/FLU/RSV testing.  Fact Sheet for Patients: EntrepreneurPulse.com.au  Fact Sheet for Healthcare Providers: IncredibleEmployment.be  This test is not yet approved or cleared by the [place] FDA and has been authorized for detection and/or diagnosis of SARS-CoV-2 by FDA under an Emergency Use Authorization (EUA). This EUA will remain in effect (meaning this test can be used) for the duration of the COVID-19 declaration under Section 564(b)(1) of the Act, 21 U.S.C. section 360bbb-3(b)(1), unless the authorization is terminated or revoked.  Performed at [facility], [address] [place], [place] [address]   Urine Culture     Status: Abnormal   Collection Time: 05/16/21  8:30 AM   Specimen: Urine, Random  Result Value Ref Range Status   Specimen Description   Final    URINE, RANDOM Performed at [facility], [address], [place], [place] [address]    Special Requests   Final    NONE  Performed at [facility], [address], [place], [place] [address]    Culture (A)  Final    <10,000 COLONIES/mL INSIGNIFICANT GROWTH Performed at [facility] [address] [place], [place] [address]    Report Status 05/17/2021 FINAL  Final  MRSA Next Gen by PCR, Nasal     Status: Abnormal   Collection Time: 05/16/21 12:51 PM   Specimen: Nasal Mucosa; Nasal Swab  Result Value Ref Range Status   MRSA by PCR Next  Gen DETECTED (A) NOT DETECTED Final    Comment: RESULT CALLED TO, READ BACK BY AND VERIFIED WITH: BERNA MIRAFUENTE AT 1416 05/16/21.PMF (NOTE) The GeneXpert MRSA Assay (FDA approved for NASAL specimens only), is one component of a comprehensive MRSA colonization surveillance program. It is not intended to diagnose MRSA infection nor to guide or monitor treatment for MRSA infections. Test performance is not FDA approved in patients less than 35 years old. Performed at [facility], [address] [place], [place] [address]   CULTURE, BLOOD (ROUTINE X 2) w Reflex to ID Panel     Status: None (Preliminary result)   Collection Time: 05/18/21  9:21 AM   Specimen: BLOOD  Result Value Ref Range Status   Specimen Description BLOOD LEFT ANTECUBITAL  Final   Special Requests   Final    BOTTLES DRAWN AEROBIC AND ANAEROBIC Blood Culture adequate volume   Culture   Final    NO GROWTH 2 DAYS Performed at [facility], [address] [place], [place] [address]    Report Status PENDING  Incomplete  CULTURE, BLOOD (ROUTINE X 2) w Reflex to ID Panel     Status: None (Preliminary result)   Collection Time: 05/18/21  9:21 AM   Specimen: BLOOD  Result Value Ref Range Status   Specimen Description BLOOD BLOOD LEFT HAND  Final   Special Requests   Final    BOTTLES DRAWN AEROBIC AND ANAEROBIC Blood Culture adequate volume   Culture   Final    NO GROWTH 2 DAYS Performed at [facility], [address] [place], [place] [address]    Report Status PENDING  Incomplete     Discharge Instructions:   Discharge Instructions     Diet - low sodium heart healthy   Complete by: As directed    Increase activity slowly   Complete by: As directed       Allergies as of 05/21/2021       Reactions   Fentanyl Nausea Only   Oxycodone Nausea And Vomiting        Medication List     TAKE these medications    acetaminophen 325 MG tablet Commonly known as:  TYLENOL Take 500 mg by mouth every 6 (six) hours as needed.   amLODipine 10 MG tablet Commonly known as: NORVASC Take 10 mg by mouth every morning.   aspirin 81 MG EC tablet Take 81 mg by mouth daily.   atorvastatin 40 MG tablet Commonly known as: LIPITOR Take 40 mg by mouth daily.   clopidogrel 75 MG tablet Commonly known as: PLAVIX Take 75 mg by mouth daily.   diclofenac Sodium 1 % Gel Commonly known as: VOLTAREN Apply 2 g topically 2 (two) times daily.   donepezil 5 MG tablet Commonly known as: ARICEPT Take 5 mg by mouth at bedtime.   furosemide 20 MG tablet Commonly known as: LASIX Take 20 mg by mouth daily as needed.   gabapentin 300 MG capsule  Commonly known as: NEURONTIN Take 300 mg by mouth 3 (three) times daily.   losartan 25 MG tablet Commonly known as: COZAAR Take 25 mg by mouth daily.   mirabegron ER 25 MG Tb24 tablet Commonly known as: MYRBETRIQ Take 25 mg by mouth daily.   omeprazole 20 MG capsule Commonly known as: PRILOSEC Take 1 capsule (20 mg total) by mouth daily.   spironolactone 25 MG tablet Commonly known as: ALDACTONE Take 25 mg by mouth every morning.   tamsulosin 0.4 MG Caps capsule Commonly known as: FLOMAX Take 0.4 mg by mouth daily.   tiZANidine 4 MG tablet Commonly known as: ZANAFLEX Take 1 tablet (4 mg total) by mouth every 6 (six) hours as needed for muscle spasms.   vitamin B-12 1000 MCG tablet Commonly known as: CYANOCOBALAMIN Take 1,000 mcg by mouth daily.        Contact information for after-discharge care     [facility] .   Service: Skilled Nursing Contact information: [address] [place] [phone]                       If you experience worsening of your admission symptoms, develop shortness of breath, life threatening emergency, suicidal or homicidal thoughts you must seek medical attention immediately by  calling 911 or calling your MD immediately  if symptoms less severe.   You must read complete instructions/literature along with all the possible adverse reactions/side effects for all the medicines you take and that have been prescribed to you. Take any new medicines after you have completely understood and accept all the possible adverse reactions/side effects.    Please note   You were cared for by a hospitalist during your hospital stay. If you have any questions about your discharge medications or the care you received while you were in the hospital after you are discharged, you can call the unit and asked to speak with the hospitalist on call if the hospitalist that took care of you is not available. Once you are discharged, your primary care physician will handle any further medical issues. Please note that NO REFILLS for any discharge medications will be authorized once you are discharged, as it is imperative that you return to your primary care physician (or establish a relationship with a primary care physician if you do not have one) for your aftercare needs so that they can reassess your need for medications and monitor your lab values.       Time coordinating discharge: 34 minutes  Signed:  Vanita Cannell  [facility] 05/21/2021, 1:24 PM   Pager on [email or web address]. If 7PM-7AM, please contact night-coverage at [email or web address]

## 2021-05-23 LAB — CULTURE, BLOOD (ROUTINE X 2)
Culture: NO GROWTH
Culture: NO GROWTH
Special Requests: ADEQUATE
Special Requests: ADEQUATE

## 2021-09-07 ENCOUNTER — Emergency Department: Payer: Medicare Other

## 2021-09-07 ENCOUNTER — Encounter: Payer: Self-pay | Admitting: Radiology

## 2021-09-07 ENCOUNTER — Inpatient Hospital Stay
Admission: EM | Admit: 2021-09-07 | Discharge: 2021-09-10 | DRG: 699 | Disposition: A | Discharge status: Skilled Nursing Facility | Payer: Medicare Other | Attending: Internal Medicine | Admitting: Internal Medicine

## 2021-09-07 ENCOUNTER — Other Ambulatory Visit: Payer: Self-pay

## 2021-09-07 DIAGNOSIS — I10 Essential (primary) hypertension: Secondary | ICD-10-CM | POA: Diagnosis not present

## 2021-09-07 DIAGNOSIS — R531 Weakness: Principal | ICD-10-CM

## 2021-09-07 DIAGNOSIS — Z8249 Family history of ischemic heart disease and other diseases of the circulatory system: Secondary | ICD-10-CM

## 2021-09-07 DIAGNOSIS — K592 Neurogenic bowel, not elsewhere classified: Secondary | ICD-10-CM | POA: Diagnosis present

## 2021-09-07 DIAGNOSIS — N179 Acute kidney failure, unspecified: Secondary | ICD-10-CM | POA: Diagnosis present

## 2021-09-07 DIAGNOSIS — Z885 Allergy status to narcotic agent status: Secondary | ICD-10-CM

## 2021-09-07 DIAGNOSIS — K5900 Constipation, unspecified: Secondary | ICD-10-CM | POA: Diagnosis not present

## 2021-09-07 DIAGNOSIS — Z905 Acquired absence of kidney: Secondary | ICD-10-CM | POA: Diagnosis not present

## 2021-09-07 DIAGNOSIS — Z7982 Long term (current) use of aspirin: Secondary | ICD-10-CM | POA: Diagnosis not present

## 2021-09-07 DIAGNOSIS — M4326 Fusion of spine, lumbar region: Secondary | ICD-10-CM | POA: Diagnosis present

## 2021-09-07 DIAGNOSIS — Z8673 Personal history of transient ischemic attack (TIA), and cerebral infarction without residual deficits: Secondary | ICD-10-CM

## 2021-09-07 DIAGNOSIS — E860 Dehydration: Secondary | ICD-10-CM | POA: Diagnosis present

## 2021-09-07 DIAGNOSIS — F5104 Psychophysiologic insomnia: Secondary | ICD-10-CM | POA: Diagnosis present

## 2021-09-07 DIAGNOSIS — N400 Enlarged prostate without lower urinary tract symptoms: Secondary | ICD-10-CM | POA: Diagnosis not present

## 2021-09-07 DIAGNOSIS — G8929 Other chronic pain: Secondary | ICD-10-CM | POA: Diagnosis not present

## 2021-09-07 DIAGNOSIS — Y846 Urinary catheterization as the cause of abnormal reaction of the patient, or of later complication, without mention of misadventure at the time of the procedure: Secondary | ICD-10-CM | POA: Diagnosis present

## 2021-09-07 DIAGNOSIS — N319 Neuromuscular dysfunction of bladder, unspecified: Secondary | ICD-10-CM | POA: Diagnosis not present

## 2021-09-07 DIAGNOSIS — N390 Urinary tract infection, site not specified: Secondary | ICD-10-CM | POA: Diagnosis not present

## 2021-09-07 DIAGNOSIS — R251 Tremor, unspecified: Secondary | ICD-10-CM | POA: Diagnosis present

## 2021-09-07 DIAGNOSIS — Z87891 Personal history of nicotine dependence: Secondary | ICD-10-CM

## 2021-09-07 DIAGNOSIS — R4189 Other symptoms and signs involving cognitive functions and awareness: Secondary | ICD-10-CM | POA: Diagnosis not present

## 2021-09-07 DIAGNOSIS — Z981 Arthrodesis status: Secondary | ICD-10-CM | POA: Diagnosis not present

## 2021-09-07 DIAGNOSIS — Z20822 Contact with and (suspected) exposure to covid-19: Secondary | ICD-10-CM | POA: Diagnosis present

## 2021-09-07 DIAGNOSIS — Z85528 Personal history of other malignant neoplasm of kidney: Secondary | ICD-10-CM

## 2021-09-07 DIAGNOSIS — Z7902 Long term (current) use of antithrombotics/antiplatelets: Secondary | ICD-10-CM

## 2021-09-07 DIAGNOSIS — Z87440 Personal history of urinary (tract) infections: Secondary | ICD-10-CM

## 2021-09-07 DIAGNOSIS — T83511A Infection and inflammatory reaction due to indwelling urethral catheter, initial encounter: Principal | ICD-10-CM | POA: Diagnosis present

## 2021-09-07 DIAGNOSIS — R778 Other specified abnormalities of plasma proteins: Secondary | ICD-10-CM | POA: Diagnosis present

## 2021-09-07 DIAGNOSIS — B9620 Unspecified Escherichia coli [E. coli] as the cause of diseases classified elsewhere: Secondary | ICD-10-CM | POA: Diagnosis present

## 2021-09-07 DIAGNOSIS — Z79899 Other long term (current) drug therapy: Secondary | ICD-10-CM | POA: Diagnosis not present

## 2021-09-07 DIAGNOSIS — G629 Polyneuropathy, unspecified: Secondary | ICD-10-CM | POA: Diagnosis not present

## 2021-09-07 DIAGNOSIS — E785 Hyperlipidemia, unspecified: Secondary | ICD-10-CM | POA: Diagnosis present

## 2021-09-07 DIAGNOSIS — C649 Malignant neoplasm of unspecified kidney, except renal pelvis: Secondary | ICD-10-CM | POA: Diagnosis present

## 2021-09-07 LAB — RESP PANEL BY RT-PCR (FLU A&B, COVID) ARPGX2
Influenza A by PCR: NEGATIVE
Influenza B by PCR: NEGATIVE
SARS Coronavirus 2 by RT PCR: NEGATIVE

## 2021-09-07 LAB — CBC WITH DIFFERENTIAL/PLATELET
Abs Immature Granulocytes: 0.04 10*3/uL (ref 0.00–0.07)
Basophils Absolute: 0 10*3/uL (ref 0.0–0.1)
Basophils Relative: 0 %
Eosinophils Absolute: 0.1 10*3/uL (ref 0.0–0.5)
Eosinophils Relative: 1 %
HCT: 41.5 % (ref 39.0–52.0)
Hemoglobin: 13 g/dL (ref 13.0–17.0)
Immature Granulocytes: 0 %
Lymphocytes Relative: 14 %
Lymphs Abs: 1.5 10*3/uL (ref 0.7–4.0)
MCH: 28.4 pg (ref 26.0–34.0)
MCHC: 31.3 g/dL (ref 30.0–36.0)
MCV: 90.6 fL (ref 80.0–100.0)
Monocytes Absolute: 0.7 10*3/uL (ref 0.1–1.0)
Monocytes Relative: 7 %
Neutro Abs: 8.6 10*3/uL — ABNORMAL HIGH (ref 1.7–7.7)
Neutrophils Relative %: 78 %
Platelets: 222 10*3/uL (ref 150–400)
RBC: 4.58 MIL/uL (ref 4.22–5.81)
RDW: 14.1 % (ref 11.5–15.5)
WBC: 11 10*3/uL — ABNORMAL HIGH (ref 4.0–10.5)
nRBC: 0 % (ref 0.0–0.2)

## 2021-09-07 LAB — COMPREHENSIVE METABOLIC PANEL
ALT: 15 U/L (ref 0–44)
AST: 20 U/L (ref 15–41)
Albumin: 3.9 g/dL (ref 3.5–5.0)
Alkaline Phosphatase: 71 U/L (ref 38–126)
Anion gap: 9 (ref 5–15)
BUN: 24 mg/dL — ABNORMAL HIGH (ref 8–23)
CO2: 27 mmol/L (ref 22–32)
Calcium: 9 mg/dL (ref 8.9–10.3)
Chloride: 102 mmol/L (ref 98–111)
Creatinine, Ser: 1.56 mg/dL — ABNORMAL HIGH (ref 0.61–1.24)
GFR, Estimated: 44 mL/min — ABNORMAL LOW (ref >60)
Glucose, Bld: 111 mg/dL — ABNORMAL HIGH (ref 70–99)
Potassium: 4.3 mmol/L (ref 3.5–5.1)
Sodium: 138 mmol/L (ref 135–145)
Total Bilirubin: 0.8 mg/dL (ref 0.3–1.2)
Total Protein: 7.9 g/dL (ref 6.5–8.1)

## 2021-09-07 LAB — URINALYSIS, COMPLETE (UACMP) WITH MICROSCOPIC
Bilirubin Urine: NEGATIVE
Glucose, UA: NEGATIVE mg/dL
Ketones, ur: NEGATIVE mg/dL
Nitrite: NEGATIVE
Protein, ur: 30 mg/dL — AB
RBC / HPF: >50 RBC/hpf — ABNORMAL HIGH (ref 0–5)
Specific Gravity, Urine: 1.019 (ref 1.005–1.030)
WBC, UA: >50 WBC/hpf — ABNORMAL HIGH (ref 0–5)
pH: 5 (ref 5.0–8.0)

## 2021-09-07 LAB — LACTIC ACID, PLASMA
Lactic Acid, Venous: 1.7 mmol/L (ref 0.5–1.9)
Lactic Acid, Venous: 1.4 mmol/L (ref 0.5–1.9)

## 2021-09-07 LAB — MAGNESIUM: Magnesium: 2.2 mg/dL (ref 1.7–2.4)

## 2021-09-07 LAB — PROTIME-INR
INR: 1.1 (ref 0.8–1.2)
Prothrombin Time: 13.8 seconds (ref 11.4–15.2)

## 2021-09-07 LAB — TROPONIN I (HIGH SENSITIVITY)
Troponin I (High Sensitivity): 34 ng/L — ABNORMAL HIGH (ref <18)
Troponin I (High Sensitivity): 27 ng/L — ABNORMAL HIGH (ref <18)

## 2021-09-07 LAB — PROCALCITONIN: Procalcitonin: <0.1 ng/mL

## 2021-09-07 LAB — TSH: TSH: 2 u[IU]/mL (ref 0.350–4.500)

## 2021-09-07 LAB — APTT: aPTT: 36 seconds (ref 24–36)

## 2021-09-07 MED ORDER — DONEPEZIL HCL 5 MG PO TABS
5.0000 mg | ORAL_TABLET | Freq: Every day | ORAL | Status: DC
  Administered 2021-09-08 – 2021-09-09 (×3): 5 mg via ORAL
  Filled 2021-09-07 (×3): qty 1

## 2021-09-07 MED ORDER — LIDOCAINE 5 % EX PTCH
1.0000 | MEDICATED_PATCH | CUTANEOUS | Status: DC
  Administered 2021-09-07 – 2021-09-09 (×3): 1 via TRANSDERMAL
  Filled 2021-09-07 (×3): qty 1

## 2021-09-07 MED ORDER — LACTATED RINGERS IV BOLUS
1000.0000 mL | Freq: Once | INTRAVENOUS | Status: AC
  Administered 2021-09-07: 1000 mL via INTRAVENOUS

## 2021-09-07 MED ORDER — VANCOMYCIN HCL IN DEXTROSE 1-5 GM/200ML-% IV SOLN
1000.0000 mg | Freq: Once | INTRAVENOUS | Status: AC
  Administered 2021-09-07: 1000 mg via INTRAVENOUS
  Filled 2021-09-07: qty 200

## 2021-09-07 MED ORDER — ACETAMINOPHEN 500 MG PO TABS
500.0000 mg | ORAL_TABLET | Freq: Four times a day (QID) | ORAL | Status: DC | PRN
  Administered 2021-09-09: 500 mg via ORAL
  Filled 2021-09-07: qty 1

## 2021-09-07 MED ORDER — PANTOPRAZOLE SODIUM 40 MG PO TBEC
40.0000 mg | DELAYED_RELEASE_TABLET | Freq: Every day | ORAL | Status: DC
  Administered 2021-09-08 – 2021-09-10 (×3): 40 mg via ORAL
  Filled 2021-09-07 (×3): qty 1

## 2021-09-07 MED ORDER — TAMSULOSIN HCL 0.4 MG PO CAPS
0.4000 mg | ORAL_CAPSULE | Freq: Every day | ORAL | Status: DC
  Administered 2021-09-08 – 2021-09-10 (×3): 0.4 mg via ORAL
  Filled 2021-09-07 (×3): qty 1

## 2021-09-07 MED ORDER — SODIUM CHLORIDE 0.9 % IV SOLN
2.0000 g | Freq: Once | INTRAVENOUS | Status: AC
  Administered 2021-09-07: 2 g via INTRAVENOUS
  Filled 2021-09-07: qty 12.5

## 2021-09-07 MED ORDER — FUROSEMIDE 40 MG PO TABS: 20.0000 mg | ORAL_TABLET | Freq: Every day | ORAL | Status: DC | PRN

## 2021-09-07 MED ORDER — LACTATED RINGERS IV SOLN: INTRAVENOUS | Status: AC

## 2021-09-07 MED ORDER — ASPIRIN EC 81 MG PO TBEC
81.0000 mg | DELAYED_RELEASE_TABLET | Freq: Every day | ORAL | Status: DC
  Administered 2021-09-08 – 2021-09-10 (×3): 81 mg via ORAL
  Filled 2021-09-07 (×3): qty 1

## 2021-09-07 MED ORDER — DICLOFENAC SODIUM 1 % EX GEL
2.0000 g | Freq: Two times a day (BID) | CUTANEOUS | Status: DC
  Administered 2021-09-08 – 2021-09-10 (×3): 2 g via TOPICAL
  Filled 2021-09-07: qty 100

## 2021-09-07 MED ORDER — ENOXAPARIN SODIUM 60 MG/0.6ML IJ SOSY
0.5000 mg/kg | PREFILLED_SYRINGE | INTRAMUSCULAR | Status: DC
  Administered 2021-09-08 – 2021-09-10 (×3): 52.5 mg via SUBCUTANEOUS
  Filled 2021-09-07 (×3): qty 0.6

## 2021-09-07 MED ORDER — VITAMIN B-12 1000 MCG PO TABS
1000.0000 ug | ORAL_TABLET | Freq: Every day | ORAL | Status: DC
  Administered 2021-09-08 – 2021-09-10 (×3): 1000 ug via ORAL
  Filled 2021-09-07 (×4): qty 1

## 2021-09-07 MED ORDER — IOHEXOL 300 MG/ML  SOLN
80.0000 mL | Freq: Once | INTRAMUSCULAR | Status: AC | PRN
  Administered 2021-09-07: 80 mL via INTRAVENOUS

## 2021-09-07 MED ORDER — ATORVASTATIN CALCIUM 20 MG PO TABS
40.0000 mg | ORAL_TABLET | Freq: Every day | ORAL | Status: DC
  Administered 2021-09-08 – 2021-09-10 (×3): 40 mg via ORAL
  Filled 2021-09-07 (×3): qty 2

## 2021-09-07 MED ORDER — DICLOFENAC SODIUM 1 % EX GEL: 2.0000 g | Freq: Two times a day (BID) | CUTANEOUS | Status: DC

## 2021-09-07 MED ORDER — ACETAMINOPHEN 10 MG/ML IV SOLN
1000.0000 mg | INTRAVENOUS | Status: AC
  Administered 2021-09-07: 1000 mg via INTRAVENOUS
  Filled 2021-09-07: qty 100

## 2021-09-07 MED ORDER — GABAPENTIN 300 MG PO CAPS
300.0000 mg | ORAL_CAPSULE | Freq: Three times a day (TID) | ORAL | Status: DC
  Administered 2021-09-08 – 2021-09-10 (×8): 300 mg via ORAL
  Filled 2021-09-07 (×8): qty 1

## 2021-09-07 MED ORDER — TRAZODONE HCL 50 MG PO TABS
50.0000 mg | ORAL_TABLET | Freq: Every day | ORAL | Status: DC
  Administered 2021-09-08: 50 mg via ORAL
  Administered 2021-09-08 – 2021-09-09 (×2): 100 mg via ORAL
  Filled 2021-09-07: qty 1
  Filled 2021-09-07 (×2): qty 2

## 2021-09-07 NOTE — H&P (Signed)
?History and Physical ? ?Adam Rios. YHC:[id number] DOB: 04-Sep-1940 DOA: 09/07/2021 ? ?Referring physician: Dr. Creig Hines, [place]  ?PCP: Adam Argyle, MD  ?Outpatient Specialists: Urology, neurology. ?Patient coming from: Home. ? ?Chief Complaint: Hurting all over. ? ?HPI: Adam Rios. is a 81 y.o. male with medical history significant for cognitive impairment, spinal stenosis of lumbar region at multiple levels, sleep disorder, CVA, renal cell carcinoma status post left nephrectomy, ventral hernia, neurogenic bladder with indwelling Foley catheter in place, recurrent UTIs, BPH, essential hypertension, hyperlipidemia, polyneuropathy, GERD, who presented to [facility] ED from home due to hurting all over.  Associated with generalized weakness and tremors in both hands.  Hand tremors started around 11 AM.  Sleeps in a recliner and could not use his legs this morning.  No recent falls.  No use of alcohol.   ? ?Was brought in via EMS.  Patient was hypotensive in the ED.  Code sepsis was called in the ED.  Due to concern for occult infection was pan scanned in the ED, this was unrevealing.  Patient declined MRI brain to rule out stroke, stating he cannot lay down flat due to prior spinal surgery.   ? ?UA was positive for pyuria, self-reported Foley catheter was changed about a week ago.  Usually changed every 4 weeks.  Lab studies remarkable for AKI with creatinine 1.56 with baseline creatinine of 0.8.  Elevated troponin, peaked at 34 and downtrending.  WBC 11.0.  Blood cultures were obtained, patient received 1 dose of IV cefepime, IV vancomycin and 2 L of IV fluid boluses of lactated Ringer's.  TRH, hospitalist service, was asked to admit. ? ?ED Course: Tmax 98.  BP 114/64 after 2 liters IV fluid boluses.  Significant labs as stated above. ? ?Review of Systems: ?Review of systems as noted in the HPI. All other systems reviewed and are negative. ? ? ?Past Medical History:  ?Diagnosis Date  ? Cognitive impairment   ?  Complication of anesthesia   ? Fentanyl causes nausea  ? Hemorrhagic cerebrovascular accident (CVA) ([place])   ? Hypertension   ? Neurogenic bladder   ? Renal cell carcinoma ([place]) 2005  ? Renal disorder   ? Stroke [facility])   ?  01/17/18, 12/21   Some weakness and cognitive decline  ? ?Past Surgical History:  ?Procedure Laterality Date  ? APPENDECTOMY    ? CATARACT EXTRACTION W/PHACO Right 07/22/2020  ? Procedure: CATARACT EXTRACTION PHACO AND INTRAOCULAR LENS PLACEMENT ([place]) RIGHT;  Surgeon: Adam Bear, MD;  Location: [facility];  Service: Ophthalmology;  Laterality: Right;  4.77 ?0:44.2  ? CATARACT EXTRACTION W/PHACO Left 08/05/2020  ? Procedure: CATARACT EXTRACTION PHACO AND INTRAOCULAR LENS PLACEMENT ([facility]) LEFT 10.73 01:13.8;  Surgeon: Adam Bear, MD;  Location: [facility];  Service: Ophthalmology;  Laterality: Left;  Use eye stretcher not chair  ? HERNIA REPAIR    ? LUMBAR FUSION    ? NEPHRECTOMY Left 2005  ? ? ?Social History:  reports that he quit smoking about 18 years ago. His smoking use included cigarettes. He has a 2.50 pack-year smoking history. He has never used smokeless tobacco. He reports that he does not currently use alcohol. He reports that he does not use drugs. ? ? ?Allergies  ?Allergen Reactions  ? Fentanyl Nausea Only  ? Oxycodone Nausea And Vomiting  ? ?Family history: ?Arthritis in his mother ?Heart disease in his sister, hypertension and his sister. ? ?Prior to Admission medications   ?Medication  Sig Start Date End Date Taking? Authorizing Provider  ?aspirin 81 MG EC tablet Take 81 mg by mouth daily.   Yes [provider]  ?atorvastatin (LIPITOR) 40 MG tablet Take 40 mg by mouth daily.   Yes [provider]  ?diclofenac Sodium (VOLTAREN) 1 % GEL Apply 2 g topically 2 (two) times daily. 05/11/21  Yes [provider]  ?donepezil (ARICEPT) 5 MG tablet Take 5 mg by mouth at bedtime.   Yes [provider]  ?furosemide (LASIX) 20 MG  tablet Take 20 mg by mouth daily as needed.   Yes [provider]  ?gabapentin (NEURONTIN) 300 MG capsule Take 300 mg by mouth 3 (three) times daily.   Yes [provider]  ?losartan (COZAAR) 25 MG tablet Take 25 mg by mouth daily. 03/27/17 09/07/21 Yes [provider]  ?mirabegron ER (MYRBETRIQ) 25 MG TB24 tablet Take 25 mg by mouth daily.   Yes [provider]  ?omeprazole (PRILOSEC) 20 MG capsule Take 1 capsule (20 mg total) by mouth daily. 05/21/21  Yes Jennye Boroughs, MD  ?spironolactone (ALDACTONE) 25 MG tablet Take 25 mg by mouth every morning. 03/08/21  Yes [provider]  ?tamsulosin (FLOMAX) 0.4 MG CAPS capsule Take 0.4 mg by mouth daily.   Yes [provider]  ?traZODone (DESYREL) 50 MG tablet Take 50-100 mg by mouth at bedtime. 08/12/21  Yes [provider]  ?vitamin B-12 (CYANOCOBALAMIN) 1000 MCG tablet Take 1,000 mcg by mouth daily.   Yes [provider]  ?acetaminophen (TYLENOL) 325 MG tablet Take 500 mg by mouth every 6 (six) hours as needed. 08/14/14   [provider]  ?amLODipine (NORVASC) 10 MG tablet Take 10 mg by mouth every morning. ?Patient not taking: Reported on 09/07/2021 03/09/21   [provider]  ?clopidogrel (PLAVIX) 75 MG tablet Take 75 mg by mouth daily. ?Patient not taking: Reported on 09/07/2021 01/18/21   [provider]  ?lidocaine (LIDODERM) 5 % Place 2 patches onto the skin daily. 09/04/21   [provider]  ?tiZANidine (ZANAFLEX) 4 MG tablet Take 1 tablet (4 mg total) by mouth every 6 (six) hours as needed for muscle spasms. 05/21/21   Jennye Boroughs, MD  ? ? ?Physical Exam: ?BP 98/78   Pulse 71   Temp 98 ?F (36.7 ?C) (Oral)   Resp (!) 21   Ht '6\' 1"'$  (1.854 m)   Wt 106.6 kg   SpO2 97%   BMI 31.00 kg/m?  ? ?General: 81 y.o. year-old male well developed well nourished in no acute distress.  Alert and oriented x3. ?Cardiovascular: Regular rate and rhythm with no rubs or gallops.   No thyromegaly or JVD noted.  2+ pitting edema in lower extremities bilaterally.   ?Respiratory: Clear to auscultation with no wheezes or rales. Good inspiratory effort. ?Abdomen: Soft nontender nondistended with normal bowel sounds x4 quadrants. ?Muskuloskeletal: No cyanosis or clubbing.  2+ pitting edema noted bilaterally ?Neuro: CN II-XII intact, strength, sensation, reflexes ?Skin: No ulcerative lesions noted or rashes ?Psychiatry: Judgement and insight appear normal. Mood is appropriate for condition and setting ?   ?   ?   ?Labs on Admission:  ?Basic Metabolic Panel: ?Recent Labs  ?Lab 09/07/21 ?1743  ?NA 138  ?K 4.3  ?CL 102  ?CO2 27  ?GLUCOSE 111*  ?BUN 24*  ?CREATININE 1.56*  ?CALCIUM 9.0  ?MG 2.2  ? ?Liver Function Tests: ?Recent Labs  ?Lab 09/07/21 ?1743  ?AST 20  ?ALT 15  ?ALKPHOS  71  ?BILITOT 0.8  ?PROT 7.9  ?ALBUMIN 3.9  ? ?No results for input(s): LIPASE, AMYLASE in the last 168 hours. ?No results for input(s): AMMONIA in the last 168 hours. ?CBC: ?Recent Labs  ?Lab 09/07/21 ?1743  ?WBC 11.0*  ?NEUTROABS 8.6*  ?HGB 13.0  ?HCT 41.5  ?MCV 90.6  ?PLT 222  ? ?Cardiac Enzymes: ?No results for input(s): CKTOTAL, CKMB, CKMBINDEX, TROPONINI in the last 168 hours. ? ?BNP (last 3 results) ?Recent Labs  ?  05/14/21 ?1710  ?BNP 56.3  ? ? ?ProBNP (last 3 results) ?No results for input(s): PROBNP in the last 8760 hours. ? ?CBG: ?No results for input(s): GLUCAP in the last 168 hours. ? ?Radiological Exams on Admission: ?CT HEAD WO CONTRAST (5MM) ? ?Result Date: 09/07/2021 ?CLINICAL DATA:  Neuro deficit, acute, stroke suspected EXAM: CT HEAD WITHOUT CONTRAST TECHNIQUE: Contiguous axial images were obtained from the base of the skull through the vertex without intravenous contrast. RADIATION DOSE REDUCTION: This exam was performed according to the departmental dose-optimization program which includes automated exposure control, adjustment of the mA and/or kV according to patient size and/or use of iterative  reconstruction technique. COMPARISON:  Head CT dated May 14, 2021 FINDINGS: Evaluation is limited secondary to motion Brain: No evidence of acute infarction, hemorrhage, hydrocephalus, extra-axial collection or mass le

## 2021-09-07 NOTE — Progress Notes (Signed)
Elink following code sepsis °

## 2021-09-07 NOTE — Sepsis Progress Note (Addendum)
Notified provider of need to order more fluid per sepsis protocol. An additional bolus was ordered for a total of 2L. Will continue to monitor code sepsis.   ?

## 2021-09-07 NOTE — ED Triage Notes (Signed)
BIB EMS from home. Pt took a nap at 9am. Noticed weakness and tremors at 1130 when he got up. Has edema to lower extremities which is baseline for him.  ?103/64 ?98 ?97% on RA ?

## 2021-09-07 NOTE — Code Documentation (Signed)
CODE SEPSIS - PHARMACY COMMUNICATION ? ?**Broad Spectrum Antibiotics should be administered within 1 hour of Sepsis diagnosis** ? ?Time Code Sepsis Called/Page Received: 1930 ? ?Antibiotics Ordered: Cefepime, Vancomycin ? ?Time of 1st antibiotic administration: 1946 ? ? ? ?Darrick Penna ,PharmD ?Clinical Pharmacist  ?09/07/2021  7:34 PM ? ?

## 2021-09-07 NOTE — ED Provider Notes (Signed)
? ?[facility] ?Provider Note ? ? ? Event Date/Time  ? First MD Initiated Contact with Patient 09/07/21 1735   ?  (approximate) ? ? ?History  ? ?Weakness ? ? ?HPI ? ?Adam Locket. is a 81 y.o. male  with medical history significant for hypertension, stroke, neurogenic bladder with indwelling Foley catheter, TIA, renal cell carcinoma s/p nephrectomy, cognitive dysfunction, spinal stenosis, diastolic dysfunction who presents from home for evaluation of weakness and tremor.  Patient states she was feeling fine until around 11 AM today.  He is typically able to walk without any difficulties but states he became globally weak and unable to do so around this time.  States he also developed a tremor in both hands is gotten better.  He states he was feeling his usual state of health yesterday.  He states he is hurting in the bilateral upper arms and legs although is unable to further localize on history.  He denies any recent falls or injuries.  Denies any EtOH use illicit drug use or tobacco abuse.  He denies any chest pain, cough, shortness of breath, abdominal pain, headache, earache, sore throat, change in chronic back pain or any issues with his Foley.  This was last changed 1 week ago and he states he typically has it changed every 4 weeks.  No other acute concerns at this time.  Patient denies any recent medication changes although he states he is not 100% sure. ? ?  ? ? ?Physical Exam  ?Triage Vital Signs: ?ED Triage Vitals  ?Enc Vitals Group  ?   BP --   ?   Pulse --   ?   Resp 09/07/21 1738 13  ?   Temp 09/07/21 1738 98 ?F (36.7 ?C)  ?   Temp Source 09/07/21 1738 Oral  ?   SpO2 09/07/21 1738 94 %  ?   Weight 09/07/21 1736 235 lb (106.6 kg)  ?   Height 09/07/21 1736 '6\' 1"'$  (1.854 m)  ?   Head Circumference --   ?   Peak Flow --   ?   Pain Score 09/07/21 1735 0  ?   Pain Loc --   ?   Pain Edu? --   ?   Excl. in [place]? --   ? ? ?Most recent vital signs: ?Vitals:  ? 09/07/21 2200 09/07/21 2230   ?BP: 107/62 98/78  ?Pulse: 71 71  ?Resp: 14 (!) 21  ?Temp:    ?SpO2: 96% 97%  ? ? ?General: Awake, no distress.  ?CV:  Good peripheral perfusion.  2+ radial pulse. ?Resp:  Normal effort.  Clear. ?Abd:  No distention.  Soft. ?Other:  Patient is oriented x3.  Cranial nerves II through XII are grossly intact.  Patient has symmetric strength in his extremities although his globally weak in his lower extremities only able to hold his heels off the bed for 5 to 7 seconds.  Sensation is intact to light touch in all extremities.  There is no pronator drift or finger dysmetria. ? ? ?ED Results / Procedures / Treatments  ?Labs ?(all labs ordered are listed, but only abnormal results are displayed) ?Labs Reviewed  ?COMPREHENSIVE METABOLIC PANEL - Abnormal; Notable for the following components:  ?    Result Value  ? Glucose, Bld 111 (*)   ? BUN 24 (*)   ? Creatinine, Ser 1.56 (*)   ? GFR, Estimated 44 (*)   ? All other components within normal limits  ?CBC  WITH DIFFERENTIAL/PLATELET - Abnormal; Notable for the following components:  ? WBC 11.0 (*)   ? Neutro Abs 8.6 (*)   ? All other components within normal limits  ?URINALYSIS, COMPLETE (UACMP) WITH MICROSCOPIC - Abnormal; Notable for the following components:  ? Color, Urine AMBER (*)   ? APPearance CLOUDY (*)   ? Hgb urine dipstick SMALL (*)   ? Protein, ur 30 (*)   ? Leukocytes,Ua LARGE (*)   ? RBC / HPF >50 (*)   ? WBC, UA >50 (*)   ? Bacteria, UA MANY (*)   ? All other components within normal limits  ?TROPONIN I (HIGH SENSITIVITY) - Abnormal; Notable for the following components:  ? Troponin I (High Sensitivity) 34 (*)   ? All other components within normal limits  ?TROPONIN I (HIGH SENSITIVITY) - Abnormal; Notable for the following components:  ? Troponin I (High Sensitivity) 27 (*)   ? All other components within normal limits  ?RESP PANEL BY RT-PCR (FLU A&B, COVID) [id number]  ?CULTURE, BLOOD (ROUTINE X 2)  ?CULTURE, BLOOD (ROUTINE X 2)  ?URINE CULTURE  ?LACTIC ACID,  PLASMA  ?LACTIC ACID, PLASMA  ?PROTIME-INR  ?APTT  ?TSH  ?PROCALCITONIN  ?MAGNESIUM  ?PROCALCITONIN  ?BRAIN NATRIURETIC PEPTIDE  ?URINE DRUG SCREEN, QUALITATIVE ([facility] ONLY)  ? ? ? ?EKG ? ?ECG is remarkable for sinus rhythm with a ventricular rate of 82 with left bundle branch pattern otherwise unremarkable intervals and some nonspecific ST change versus artifact throughout. ? ? ?RADIOLOGY ? ?CT head on my interpretation without evidence of hemorrhage, acute ischemia, mass effect or edema.  There is some ventriculomegaly encephalomalacia.  Also reviewed radiology interpretation and agree to findings of no acute process. ? ?CT C-spine, T-spine and L-spine reviewed by myself without evidence of clear acute fracture.  I also reviewed radiologist interpretation and agree with their findings of no acute fracture or traumatic listhesis as well as notation of multiple chronic changes including degenerative changes with mild retrolisthesis of L2 on L3, L3 on L4 and anterolisthesis of L4 and L5 with severe osseous foraminal foraminal stenosis.  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 ? ?Chest reviewed by myself shows no focal consoidation, effusion, edema, pneumothorax or other clear acute thoracic process. I also reviewed radiology interpretation and agree with findings described. ? ?CT chest abdomen pelvis reviewed by myself without evidence of a pneumonia, kidney stone, perinephric stranding, diverticulitis or significant bladder stranding.  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 ? ? ? ?PROCEDURES: ? ?Critical Care performed: Yes, see critical care procedure note(s) ? ?.Critical Care ?Performed by:  Lucrezia Starch, MD ?Authorized by: Lucrezia Starch, MD  ? ?Critical care provider statement:  ?  Critical care time (minutes):  30 ?  Critical care was necessary to treat or prevent imminent or life-threatening deterioration of the following conditions:  Sepsis ?  Critical care was time spent personally by me on the following activities:  Development of treatment plan with patient or surrogate, discussions with consultants, evaluation of patient's response to treatment, examination of patient, ordering and review of laboratory studies, ordering and review of radiographic studies, ordering and performing treatments and interventions, pulse oximetry, re-evaluation of patient's condition and  review of old charts ? ? ? ?MEDICATIONS ORDERED IN ED: ?Medications  ?lidocaine (LIDODERM) 5 % 1 patch (1 patch Transdermal Patch Applied 09/07/21 2048)  ?vancomycin (VANCOCIN) IVPB 1000 mg/200 mL premix (0 mg Intravenous Stopped 09/07/21 2228)  ?  Followed by  ?vancomycin (VANCOCIN) IVPB 1000 mg/200 mL premix (1,000 mg Intravenous New Bag/Given 09/07/21 2227)  ?lactated ringers infusion ( Intravenous New Bag/Given 09/07/21 2231)  ?lactated ringers bolus 1,000 mL (0 mLs Intravenous Stopped 09/07/21 2228)  ?acetaminophen (OFIRMEV) IV 1,000 mg (0 mg Intravenous Stopped 09/07/21 2228)  ?ceFEPIme (MAXIPIME) 2 g in sodium chloride 0.9 % 100 mL IVPB (0 g Intravenous Stopped 09/07/21 2018)  ?iohexol (OMNIPAQUE) 300 MG/ML solution 80 mL (80 mLs Intravenous Contrast Given 09/07/21 2110)  ?lactated ringers bolus 1,000 mL (0 mLs Intravenous Stopped 09/07/21 2228)  ? ? ? ?IMPRESSION / MDM / ASSESSMENT AND PLAN / ED COURSE  ?I reviewed the triage vital signs and the nursing notes. ?             ?               ? ?Differential diagnosis includes, but is not limited to acute infectious process, endocrine derangement, arrhythmia, ACS, CVA although patient does not seem to have any clear focal deficits and NIH is score of 0 on arrival, neuropathy, occult C-spine  injury. ? ?ECG is remarkable for sinus rhythm with a ventricular rate of 82 with left bundle branch pattern otherwise unremarkable intervals and some nonspecific ST change versus artifact throughout.  Minim

## 2021-09-07 NOTE — Progress Notes (Signed)
PHARMACIST - PHYSICIAN COMMUNICATION ? ?CONCERNING:  Enoxaparin (Lovenox) for DVT Prophylaxis  ? ? ?RECOMMENDATION: ?Patient was prescribed enoxaprin '40mg'$  q24 hours for VTE prophylaxis.  ? ?Danley Danker Weights  ? 09/07/21 1736  ?Weight: 106.6 kg (235 lb)  ? ? ?Body mass index is 31 kg/m?. ? ?Estimated Creatinine Clearance: 47.6 mL/min (A) (by C-G formula based on SCr of 1.56 mg/dL (H)). ? ? ?Based on [place] patient is candidate for enoxaparin 0.'5mg'$ /kg TBW SQ every 24 hours based on BMI being >30. ? ?DESCRIPTION: ?Pharmacy has adjusted enoxaparin dose per [facility] policy. ? ?Patient is now receiving enoxaparin 0.5 mg/kg every 24 hours  ? ?Renda Rolls, PharmD, MBA ?09/07/2021 ?11:21 PM ? ?

## 2021-09-08 ENCOUNTER — Encounter: Payer: Self-pay | Admitting: Internal Medicine

## 2021-09-08 DIAGNOSIS — T83511A Infection and inflammatory reaction due to indwelling urethral catheter, initial encounter: Secondary | ICD-10-CM | POA: Diagnosis not present

## 2021-09-08 DIAGNOSIS — R531 Weakness: Secondary | ICD-10-CM | POA: Diagnosis not present

## 2021-09-08 DIAGNOSIS — N179 Acute kidney failure, unspecified: Secondary | ICD-10-CM | POA: Diagnosis not present

## 2021-09-08 LAB — COMPREHENSIVE METABOLIC PANEL
ALT: 12 U/L (ref 0–44)
AST: 16 U/L (ref 15–41)
Albumin: 3.2 g/dL — ABNORMAL LOW (ref 3.5–5.0)
Alkaline Phosphatase: 57 U/L (ref 38–126)
Anion gap: 5 (ref 5–15)
BUN: 19 mg/dL (ref 8–23)
CO2: 27 mmol/L (ref 22–32)
Calcium: 8.5 mg/dL — ABNORMAL LOW (ref 8.9–10.3)
Chloride: 106 mmol/L (ref 98–111)
Creatinine, Ser: 1.11 mg/dL (ref 0.61–1.24)
GFR, Estimated: >60 mL/min (ref >60)
Glucose, Bld: 96 mg/dL (ref 70–99)
Potassium: 4 mmol/L (ref 3.5–5.1)
Sodium: 138 mmol/L (ref 135–145)
Total Bilirubin: 0.8 mg/dL (ref 0.3–1.2)
Total Protein: 6.6 g/dL (ref 6.5–8.1)

## 2021-09-08 LAB — CBC
HCT: 38.4 % — ABNORMAL LOW (ref 39.0–52.0)
HCT: 36.1 % — ABNORMAL LOW (ref 39.0–52.0)
Hemoglobin: 12 g/dL — ABNORMAL LOW (ref 13.0–17.0)
Hemoglobin: 11.3 g/dL — ABNORMAL LOW (ref 13.0–17.0)
MCH: 28.6 pg (ref 26.0–34.0)
MCH: 28.6 pg (ref 26.0–34.0)
MCHC: 31.3 g/dL (ref 30.0–36.0)
MCHC: 31.3 g/dL (ref 30.0–36.0)
MCV: 91.4 fL (ref 80.0–100.0)
MCV: 91.4 fL (ref 80.0–100.0)
Platelets: 204 10*3/uL (ref 150–400)
Platelets: 193 10*3/uL (ref 150–400)
RBC: 4.2 MIL/uL — ABNORMAL LOW (ref 4.22–5.81)
RBC: 3.95 MIL/uL — ABNORMAL LOW (ref 4.22–5.81)
RDW: 14 % (ref 11.5–15.5)
RDW: 14 % (ref 11.5–15.5)
WBC: 9.4 10*3/uL (ref 4.0–10.5)
WBC: 7.4 10*3/uL (ref 4.0–10.5)
nRBC: 0 % (ref 0.0–0.2)
nRBC: 0 % (ref 0.0–0.2)

## 2021-09-08 LAB — CREATININE, SERUM
Creatinine, Ser: 1.33 mg/dL — ABNORMAL HIGH (ref 0.61–1.24)
GFR, Estimated: 54 mL/min — ABNORMAL LOW (ref >60)

## 2021-09-08 LAB — URINE DRUG SCREEN, QUALITATIVE (ARMC ONLY)
Amphetamines, Ur Screen: NOT DETECTED
Barbiturates, Ur Screen: NOT DETECTED
Benzodiazepine, Ur Scrn: NOT DETECTED
Cannabinoid 50 Ng, Ur ~~LOC~~: NOT DETECTED
Cocaine Metabolite,Ur ~~LOC~~: NOT DETECTED
MDMA (Ecstasy)Ur Screen: NOT DETECTED
Methadone Scn, Ur: NOT DETECTED
Opiate, Ur Screen: NOT DETECTED
Phencyclidine (PCP) Ur S: NOT DETECTED
Tricyclic, Ur Screen: NOT DETECTED

## 2021-09-08 LAB — PROCALCITONIN: Procalcitonin: <0.1 ng/mL

## 2021-09-08 LAB — PHOSPHORUS: Phosphorus: 3.6 mg/dL (ref 2.5–4.6)

## 2021-09-08 LAB — BRAIN NATRIURETIC PEPTIDE: B Natriuretic Peptide: 85.9 pg/mL (ref 0.0–100.0)

## 2021-09-08 LAB — MAGNESIUM: Magnesium: 2.1 mg/dL (ref 1.7–2.4)

## 2021-09-08 MED ORDER — SENNOSIDES-DOCUSATE SODIUM 8.6-50 MG PO TABS
1.0000 | ORAL_TABLET | Freq: Every day | ORAL | Status: DC
  Administered 2021-09-08 (×2): 1 via ORAL
  Filled 2021-09-08 (×2): qty 1

## 2021-09-08 MED ORDER — POLYETHYLENE GLYCOL 3350 17 G PO PACK: 17.0000 g | PACK | Freq: Every day | ORAL | Status: DC | PRN

## 2021-09-08 MED ORDER — SODIUM CHLORIDE 0.9 % IV SOLN
1.0000 g | Freq: Two times a day (BID) | INTRAVENOUS | Status: DC
  Administered 2021-09-08 (×2): 1 g via INTRAVENOUS
  Filled 2021-09-08 (×2): qty 20

## 2021-09-08 MED ORDER — CHLORHEXIDINE GLUCONATE CLOTH 2 % EX PADS
6.0000 | MEDICATED_PAD | Freq: Every day | CUTANEOUS | Status: DC
  Administered 2021-09-08 – 2021-09-10 (×3): 6 via TOPICAL

## 2021-09-08 MED ORDER — HYDROMORPHONE HCL 1 MG/ML IJ SOLN: 0.5000 mg | INTRAMUSCULAR | Status: DC | PRN

## 2021-09-08 MED ORDER — ONDANSETRON HCL 4 MG/2ML IJ SOLN: 4.0000 mg | Freq: Four times a day (QID) | INTRAMUSCULAR | Status: DC | PRN

## 2021-09-08 MED ORDER — OXYCODONE HCL 5 MG PO TABS
5.0000 mg | ORAL_TABLET | ORAL | Status: DC | PRN
  Administered 2021-09-09 – 2021-09-10 (×4): 5 mg via ORAL
  Filled 2021-09-08 (×5): qty 1

## 2021-09-08 MED ORDER — SODIUM CHLORIDE 0.9 % IV SOLN
1.0000 g | Freq: Three times a day (TID) | INTRAVENOUS | Status: DC
  Administered 2021-09-08 – 2021-09-10 (×5): 1 g via INTRAVENOUS
  Filled 2021-09-08 (×2): qty 20
  Filled 2021-09-08: qty 1
  Filled 2021-09-08 (×2): qty 20
  Filled 2021-09-08: qty 1

## 2021-09-08 NOTE — Progress Notes (Signed)
Met with the patient at the bedside ?He lives at home with family including his daughter, son in Sports coach and grand children as well as an Forensic scientist from [place] ?He has a Corporate investment banker, a rolling walker, a raised toilet and 3 in 1, he sleeps in a recliner, he is already open with [facility] for RN, PT, OT, aide ?I notified Corene Cornea that he is admitted ?He has a chronic foley ?He does not need additional DME ?He will resume HH with [facility] at [place], His daughter will provide transportation ?

## 2021-09-08 NOTE — Progress Notes (Signed)
[place] at [facility] ? ? ?PATIENT NAME: Adam Rios   ? ?MR#:  [id number] ? ?DATE OF BIRTH:  November 06, 1940 ? ?SUBJECTIVE:  ? ?came in with generalized weakness and hurting all over with low in energy yesterday. ? ?Has chronic Foley catheter. No fever. Overall seems to be improving. No family at bedside. ? ? ?VITALS:  ?Blood pressure 127/74, pulse 73, temperature 97.7 ?F (36.5 ?C), resp. rate 18, height '6\' 1"'$  (1.854 m), weight 106.6 kg, SpO2 98 %. ? ?PHYSICAL EXAMINATION:  ? ?GENERAL:  81 y.o.-year-old patient lying in the bed with no acute distress.  ?LUNGS: Normal breath sounds bilaterally, no wheezing, rales, rhonchi.  ?CARDIOVASCULAR: S1, S2 normal. No murmurs, rubs, or gallops.  ?ABDOMEN: Soft, nontender, nondistended. Bowel sounds present. FOLEY+ ?EXTREMITIES: No  edema b/l.    ?NEUROLOGIC: nonfocal  patient is alert and awake ?SKIN: No obvious rash, lesion, or ulcer.  ? ?LABORATORY PANEL:  ?CBC ?Recent Labs  ?Lab 09/08/21 ?2992  ?WBC 7.4  ?HGB 11.3*  ?HCT 36.1*  ?PLT 193  ? ? ?Chemistries  ?Recent Labs  ?Lab 09/08/21 ?4268  ?NA 138  ?K 4.0  ?CL 106  ?CO2 27  ?GLUCOSE 96  ?BUN 19  ?CREATININE 1.11  ?CALCIUM 8.5*  ?MG 2.1  ?AST 16  ?ALT 12  ?ALKPHOS 57  ?BILITOT 0.8  ? ?Cardiac Enzymes ?No results for input(s): TROPONINI in the last 168 hours. ?RADIOLOGY:  ?CT HEAD WO CONTRAST (5MM) ? ?Result Date: 09/07/2021 ?CLINICAL DATA:  Neuro deficit, acute, stroke suspected EXAM: CT HEAD WITHOUT CONTRAST TECHNIQUE: Contiguous axial images were obtained from the base of the skull through the vertex without intravenous contrast. RADIATION DOSE REDUCTION: This exam was performed according to the departmental dose-optimization program which includes automated exposure control, adjustment of the mA and/or kV according to patient size and/or use of iterative reconstruction technique. COMPARISON:  Head CT dated May 14, 2021 FINDINGS: Evaluation is limited secondary to motion Brain: No evidence of  acute infarction, hemorrhage, hydrocephalus, extra-axial collection or mass lesion/mass effect. Encephalomalacia of the RIGHT occipital lobe consistent with the sequela of prior infarction. Periventricular white matter hypodensities consistent with sequela of chronic microvascular ischemic disease. Unchanged size and configuration of the ventricular system with prominence of the ventricles in relation to generalized volume loss. Vascular: Vascular calcifications of the vertebral arteries and carotid siphons. Skull: Normal. Negative for fracture or focal lesion. Sinuses/Orbits: No acute finding. Other: None IMPRESSION: No acute intracranial abnormality. If persistent concern, recommend dedicated MRI Electronically Signed   By: Valentino Saxon M.D.   On: 09/07/2021 18:26  ? ?CT Cervical Spine Wo Contrast ? ?Result Date: 09/07/2021 ?CLINICAL DATA:  Compression fracture, cervical. Noticed weakness and tremors at 1130 when he got up. Has edema to lower extremities which is baseline for him. EXAM: CT CERVICAL, THORACIC, AND LUMBAR SPINE WITHOUT CONTRAST TECHNIQUE: Multidetector CT imaging of the cervical, thoracic and lumbar spine was performed without intravenous contrast. Multiplanar CT image reconstructions were also generated. RADIATION DOSE REDUCTION: This exam was performed according to the departmental dose-optimization program which includes automated exposure control, adjustment of the mA and/or kV according to patient size and/or use of iterative reconstruction technique. COMPARISON:  CT chest abdomen pelvis 09/07/2021 FINDINGS: CT CERVICAL SPINE FINDINGS Alignment: Normal. Skull base and vertebrae: Multilevel degenerative changes spine with associated severe osseous neural foraminal stenosis at the C3-C4 level. No severe osseous central canal stenosis. No acute fracture. No aggressive appearing focal osseous lesion or focal pathologic  process. Soft tissues and spinal canal: No prevertebral fluid or swelling.  No visible canal hematoma. Upper chest: Unremarkable. Other: Atherosclerotic plaque of the carotid arteries within the neck. Prominent left parotid gland lymph node. CT THORACIC SPINE FINDINGS Segmentation: 12 rib-bearing thoracic vertebral bodies. Alignment: Normal. Vertebrae: Multilevel osteophyte formation. No acute fracture or focal pathologic process. Densely sclerotic lesion within T9 right transverse process likely represents a bone island (4:104). Paraspinal and other soft tissues: Negative. Disc levels: T11-T12 intervertebral disc space vacuum phenomenon. CT LUMBAR SPINE FINDINGS Segmentation: 5 lumbar type vertebrae. Alignment: Mild retrolisthesis of L2 on L3, L3 on L4. Grade 1 anterolisthesis of L4 on L5. Vertebrae: L3-L4 laminectomy. Multilevel moderate severe degenerative changes of the spine. Associated severe osseous neural foraminal stenosis at the L3-L4 levels bilaterally. No acute fracture or focal pathologic process. Paraspinal and other soft tissues: Negative. Disc levels: Maintained. 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 Severe atherosclerotic plaque. IMPRESSION: 1. No acute displaced fracture or traumatic listhesis of the cervical, thoracic, lumbar spine in a patient with L3-L4 laminectomy. 2. Multilevel degenerative changes spine, mild retrolisthesis of L2 on L3, mild retrolisthesis of L3 on L4, and grade 1 anterolisthesis of L4 on L5. Associated severe osseous neural foraminal stenosis at the C3-C4 and L3-L4 levels bilaterally. 3. 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 Please see separately dictated CT chest, abdomen, pelvis 09/07/2021. Recommend follow-up ultrasound every 3 years. This recommendation follows ACR consensus guidelines: White Paper of the ACR Incidental Findings Committee II on Vascular Findings. J Am Coll Radiol 2013; 10:789-794. 4. Aneurysmal bilateral common  iliac arteries (2.1 cm on the right and 1.8 cm on the left. 5. Aortic Atherosclerosis (ICD10-I70.0). Aortic aneurysm NOS (ICD10-I71.9). Electronically Signed   By: Iven Finn M.D.   On: 09/07/2021 22:05  ? ?CT CHEST ABDOMEN PELVIS W CONTRAST ? ?Result Date: 09/07/2021 ?CLINICAL DATA:  Weakness and possible sepsis, initial encounter EXAM: CT CHEST, ABDOMEN, AND PELVIS WITH CONTRAST TECHNIQUE: Multidetector CT imaging of the chest, abdomen and pelvis was performed following the standard protocol during bolus administration of intravenous contrast. RADIATION DOSE REDUCTION: This exam was performed according to the departmental dose-optimization program which includes automated exposure control, adjustment of the mA and/or kV according to patient size and/or use of iterative reconstruction technique. CONTRAST:  73m OMNIPAQUE IOHEXOL 300 MG/ML  SOLN COMPARISON:  05/14/2021 FINDINGS: CT CHEST FINDINGS Cardiovascular: Thoracic aorta demonstrates atherosclerotic calcifications. No aneurysmal dilatation or dissection is noted. No cardiac enlargement is seen. Coronary calcifications are noted. No findings to suggest pulmonary embolism are noted. Mediastinum/Nodes: Thoracic inlet is within normal limits. Scattered small mediastinal lymph nodes are noted. No significant lymphadenopathy by size criteria is seen. The esophagus is within normal limits. Lungs/Pleura: Lungs are well aerated bilaterally. Minimal atelectatic changes are noted in the medial left lower lobe. Right lung shows some minimal basilar scarring improved from the prior exam. No parenchymal nodules are noted. Musculoskeletal: Degenerative changes of the thoracic spine are noted. No acute rib abnormality is seen. CT ABDOMEN PELVIS FINDINGS Hepatobiliary: Liver demonstrates a cyst in the left lobe measuring up to 3.2 cm stable in appearance from the prior exam. Gallbladder is well distended and within normal limits. Pancreas: Unremarkable. No pancreatic ductal  dilatation or surrounding inflammatory changes. Spleen: Normal in size without focal abnormality. Adrenals/Urinary Tract: Adrenal glands are within normal limits. Right kidney shows a normal enhancement pa

## 2021-09-08 NOTE — Progress Notes (Signed)
Pharmacy Antibiotic Note ? ?Adam Rios. is a 81 y.o. male admitted on 09/07/2021 with UTI w/ history of ESBL E. coli.  Pharmacy has been consulted for Meropenem dosing. ? ?Plan: ?Meropenem 1 gm q12h per indication and renal fxn.  Pharmacy will continue to follow and adjust abx dosing whenever warranted. ? ?Height: '6\' 1"'$  (185.4 cm) ?Weight: 106.6 kg (235 lb) ?IBW/kg (Calculated) : 79.9 ? ?Temp (24hrs), Avg:98 ?F (36.7 ?C), Min:98 ?F (36.7 ?C), Max:98 ?F (36.7 ?C) ? ?Recent Labs  ?Lab 09/07/21 ?1743 09/07/21 ?1937 09/08/21 ?0110  ?WBC 11.0*  --  9.4  ?CREATININE 1.56*  --   --   ?LATICACIDVEN 1.7 1.4  --   ?  ?Estimated Creatinine Clearance: 47.6 mL/min (A) (by C-G formula based on SCr of 1.56 mg/dL (H)).   ? ?Allergies  ?Allergen Reactions  ? Fentanyl Nausea Only  ? Oxycodone Nausea And Vomiting  ? ? ?Antimicrobials this admission: ?4/9 Vancomycin >> x 1 (Loading dose) ?4/9 Cefepime >> x 1 dose ?4/10 Meropenem >> ? ?Microbiology results: ?4/09 BCx: Pending ?4/9 UCx: Pending  ? ?Thank you for allowing pharmacy to be a part of this patient?s care. ? ?Renda Rolls, PharmD, MBA ?09/08/2021 ?1:36 AM ? ? ? ?

## 2021-09-08 NOTE — Progress Notes (Signed)
Pharmacy Antibiotic Note ? ?Adam Rios. is a 81 y.o. male admitted on 09/07/2021 with UTI w/ history of ESBL E. coli.  Pharmacy has been consulted for Meropenem dosing. ?UCx pending.  ? ? ?Plan: ?Will adjust dose to Meropenem 1 gm q8h per indication and renal fxn.  ? ? Pharmacy will continue to follow and adjust abx dosing whenever warranted. ? ?Height: '6\' 1"'$  (185.4 cm) ?Weight: 106.6 kg (235 lb) ?IBW/kg (Calculated) : 79.9 ? ?Temp (24hrs), Avg:97.9 ?F (36.6 ?C), Min:97.7 ?F (36.5 ?C), Max:98 ?F (36.7 ?C) ? ?Recent Labs  ?Lab 09/07/21 ?1743 09/07/21 ?1937 09/08/21 ?0110 09/08/21 ?0415  ?WBC 11.0*  --  9.4 7.4  ?CREATININE 1.56*  --  1.33* 1.11  ?LATICACIDVEN 1.7 1.4  --   --   ? ?  ?Estimated Creatinine Clearance: 66.9 mL/min (by C-G formula based on SCr of 1.11 mg/dL).   ? ?Allergies  ?Allergen Reactions  ? Fentanyl Nausea Only  ? Oxycodone Nausea And Vomiting  ? ? ?Antimicrobials this admission: ?4/9 Vancomycin >> x 1 (Loading dose) ?4/9 Cefepime >> x 1 dose ?4/10 Meropenem >> ? ?Microbiology results: ?4/09 BCx: Pending ?4/9 UCx: Pending  ? ?Thank you for allowing pharmacy to be a part of this patient?s care. ? ?Pernell Dupre, PharmD, BCPS ?Clinical Pharmacist ?09/08/2021 ?9:50 AM ? ? ? ?

## 2021-09-08 NOTE — Evaluation (Signed)
Physical Therapy Evaluation ?Patient Details ?Name: Adam Rios. ?MRN: [id number] ?DOB: [birth year] ?Today's Date: 09/08/2021 ? ?History of Present Illness ? Pt  is a 81 y.o. Caucasian male that presented to the ED for generalized weakness and tremors in both hands, generalized pain. Workup showed UTI, sepsis. 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 ?  ?Clinical Impression ? Patient alert, sitting EOB at start of session upon PT entrance. Seen as a co-treat to maximize pt/therapist safety for mobility. Per OT/pt, he is ambulatory with his rollator at baseline for household distances, as well as sleeps in a lift recliner.  ? ?Sit <> stand performed twice with RW and min-modAx2 from elevated surface. He was able to take a few forwards/backwards/sideways steps during the session, but reliant on RW as well as physical assist due to posterior lean and fear of knee buckling. Returned to supine with modA for LE assist.  Overall the patient demonstrated deficits (see "PT Problem List") that impede the patient's functional abilities, safety, and mobility and would benefit from skilled PT intervention. Recommendation is SNF due to decline from PLOF and current level of assistance needed.   ?   ? ?Recommendations for follow up therapy are one component of a multi-disciplinary discharge planning process, led by the attending physician.  Recommendations may be updated based on patient status, additional functional criteria and insurance authorization. ? ?Follow Up Recommendations Skilled nursing-short term rehab (<3 hours/day) ? ?  ?Assistance Recommended at Discharge Frequent or constant Supervision/Assistance  ?Patient can return home with the following ?  Two people to help with walking and/or transfers;Two people to help with bathing/dressing/bathroom;Direct supervision/assist for medications management;Help with stairs or ramp for entrance;Assist for transportation;Assistance with cooking/housework ? ?  ?Equipment Recommendations Other (comment) (TBD)  ?Recommendations for Other Services ?    ?  ?Functional Status Assessment Patient has had a recent decline in their functional status and demonstrates the ability to make significant improvements in function in a reasonable and predictable amount of time.  ? ?  ?Precautions / Restrictions Precautions ?Precautions: Fall ?Restrictions ?Weight Bearing Restrictions: No  ? ?  ? ?Mobility ? Bed Mobility ?Overal bed mobility: Needs Assistance ?Bed Mobility: Supine to Sit, Sit to Supine ?  ?  ?Supine to sit: Min assist ?Sit to supine: Mod assist ?  ?General bed mobility comments: min A for trunk support to EOB but needing assist with B LEs to return to bed ?  ? ?Transfers ?Overall transfer level: Needs assistance ?Equipment used: Rolling walker (2 wheels) ?Transfers: Sit to/from Stand ?Sit to Stand: Min assist, Mod assist, +2 physical assistance ?  ?  ?  ?  ?  ?General transfer comment: min - mod of 2 for side steps with pt having posterior bias ?  ? ?Ambulation/Gait ?  ?  ?  ?  ?  ?  ?  ?General Gait Details: pt able to step forwards/backwards at EOB with min-modA with RW due to posterior lean, but unable to safely ambulate further at this time ? ?Stairs ?  ?  ?  ?  ?  ? ?Wheelchair Mobility ?  ? ?Modified Rankin (Stroke Patients Only) ?  ? ?  ? ?Balance Overall balance assessment: Needs assistance ?Sitting-balance support: Feet supported ?Sitting balance-Leahy Scale: Good ?Sitting balance - Comments: supervision for  static sitting ?Postural control: Posterior lean, Right lateral lean ?Standing balance support: Reliant on assistive device for balance, During functional activity ?Standing balance-Leahy Scale: Zero ?Standing  balance comment: +2 assist ?  ?  ?  ?  ?  ?  ?  ?  ?  ?  ?  ?   ? ? ? ?Pertinent Vitals/Pain Pain Assessment ?Pain Assessment: No/denies pain  ? ? ?Home Living Family/patient expects to be discharged to:: Private residence ?Living Arrangements: Children ?Available Help at Discharge: Available PRN/intermittently;Family ?Type of Home: House ?Home Access: Stairs to enter ?Entrance Stairs-Rails: None ?Entrance Stairs-Number of Steps: 1 ?  ?Home Layout: One level ?Home Equipment: Air cabin crew (4 wheels) ?   ?  ?Prior Function Prior Level of Function : Needs assist ?  ?  ?  ?  ?  ?  ?Mobility Comments: Pt reports he ambulates short distances with RW without assistance. ?ADLs Comments: Pt reports toileting independently but has home health RN that comes 1x/wk to provide min A for bathing. ?  ? ? ?Hand Dominance  ?   ? ?  ?Extremity/Trunk Assessment  ? Upper Extremity Assessment ?Upper Extremity Assessment: Generalized weakness ?  ? ?Lower Extremity Assessment ?Lower Extremity Assessment: Generalized weakness ?  ? ?Cervical / Trunk Assessment ?Cervical / Trunk Assessment: Kyphotic  ?Communication  ? Communication: No difficulties  ?Cognition Arousal/Alertness: Awake/alert ?Behavior During Therapy: [facility] for tasks assessed/performed ?Overall Cognitive Status: Within Functional Limits for tasks assessed ?  ?  ?  ?  ?  ?  ?  ?  ?  ?  ?  ?  ?  ?  ?  ?  ?  ?  ?  ? ?  ?General Comments   ? ?  ?Exercises Other Exercises ?Other Exercises: seated LAQ and marching x5 bilaterally  ? ?Assessment/Plan  ?  ?PT Assessment Patient needs continued PT services  ?PT Problem List Decreased strength;Decreased mobility;Decreased range of motion;Decreased activity tolerance;Decreased balance;Decreased knowledge of use of DME ? ?   ?  ?PT Treatment Interventions DME instruction;Therapeutic exercise;Gait training;Balance training;Stair training;Neuromuscular re-education;Functional mobility training;Therapeutic activities;Patient/family  education   ? ?PT Goals (Current goals can be found in the Care Plan section)  ?Acute Rehab PT Goals ?Patient Stated Goal: to go home ?PT Goal Formulation: With patient ?Time For Goal Achievement: 09/22/21 ?Potential to Achieve Goals: Good ? ?  ?Frequency Min 2X/week ?  ? ? ?Co-evaluation   ?Reason for Co-Treatment: For patient/therapist safety;To address functional/ADL transfers ?PT goals addressed during session: Mobility/safety with mobility ?OT goals addressed during session: ADL's and self-care ?  ? ? ?  ?AM-PAC PT "6 Clicks" Mobility  ?Outcome Measure Help needed turning from your back to your side while in a flat bed without using bedrails?: A Lot ?Help needed moving from lying on your back to sitting on the side of a flat bed without using bedrails?: A Lot ?Help needed moving to and from a bed to a chair (including a wheelchair)?: A Lot ?Help needed standing up from a chair using your arms (e.g., wheelchair or bedside chair)?: A Lot ?Help needed to walk in hospital room?: A Lot ?Help needed climbing 3-5 steps with a railing? : Total ?6 Click Score: 11 ? ?  ?End of Session   ?Activity Tolerance: Patient tolerated treatment well ?Patient left: in bed;with call bell/phone within reach;with bed alarm set ?Nurse Communication: Mobility status ?PT Visit Diagnosis: Other abnormalities of gait and mobility (R26.89);Difficulty in walking, not elsewhere classified (R26.2);Muscle weakness (generalized) (M62.81) ?  ? ?  Time: 1036-1050 ?PT Time Calculation (min) (ACUTE ONLY): 14 min ? ? ?Charges:   PT Evaluation ?$PT Eval Low Complexity: 1 Low ?  ?  ?   ?Lieutenant Diego PT, DPT ?3:24 PM,09/08/21 ? ? ?

## 2021-09-08 NOTE — Plan of Care (Signed)

## 2021-09-08 NOTE — [place] FL2 (Signed)
?[place] MEDICAID FL2 LEVEL OF CARE SCREENING TOOL  ?  ? ?IDENTIFICATION  ?Patient Name: ?Adam Rios. Birthdate: [birth year] Sex: male Admission Date (Current Location): ?09/07/2021  ?[place] and [place] Number: ? [id number] ?  Facility and Address:  ?[facility], [address] [place], [place] [address] ?     Provider Number: ?[id number]  ?Attending Physician Name and Address:  ?Fritzi Mandes, MD ? Relative Name and Phone Number:  ?Jeani Hawking [phone] ?   ?Current Level of Care: ?Hospital Recommended Level of Care: ?[place] Prior Approval Number: ?  ? ?Date Approved/Denied: ?  PASRR Number: ?[id number] ? ?Discharge Plan: ?SNF ?  ? ?Current Diagnoses: ?Patient Active Problem List  ? Diagnosis Date Noted  ? AKI (acute kidney injury) ([place]) 09/07/2021  ? Fall 05/14/2021  ? Acute encephalopathy 01/30/2018  ? Chronic kidney disease 04/05/2017  ? GERD (gastroesophageal reflux disease) 04/05/2017  ? Pain 04/05/2017  ? Weakness 04/05/2017  ? Essential hypertension 07/31/2014  ? 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 Ventral hernia without obstruction or gangrene 04/10/2014  ? Acute low back pain without sciatica 12/26/2013  ? Fusion of spine of lumbar region 12/26/2013  ? Edema 09/26/2013  ? Hemorrhoid 09/26/2013  ? Neurogenic bladder 09/26/2013  ? Renal cell cancer ([place]) 09/26/2013  ? 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 Lower urinary tract infectious disease 10/28/2012  ? ? ?Orientation RESPIRATION BLADDER Height & Weight   ?  ?Self, Time, Situation, Place ? Normal Continent Weight: 106.6 kg ?Height:  '6\' 1"'$  (185.4 cm)  ?BEHAVIORAL SYMPTOMS/MOOD NEUROLOGICAL BOWEL NUTRITION STATUS  ?    Continent Diet (See DC Summary)  ?AMBULATORY STATUS COMMUNICATION OF NEEDS Skin   ?Extensive Assist Verbally Normal, Surgical wounds ?  ?  ?  ?    ?     ?     ? ? ?Personal Care Assistance Level of Assistance  ?Bathing, Feeding, Dressing Bathing Assistance: Maximum assistance ?Feeding  assistance: Limited assistance ?Dressing Assistance: Maximum assistance ?   ? ?Functional Limitations Info  ?    ?  ?   ? ? ?SPECIAL CARE FACTORS FREQUENCY  ?PT (By licensed PT), OT (By licensed OT)   ?  ?PT Frequency: 5 times per week ?OT Frequency: 5 times per week ?  ?  ?  ?   ? ? ?Contractures    ? ? ?Additional Factors Info  ?Code Status, Allergies Code Status Info: full code ?Allergies Info: Fentanyl, Oxycodone ?  ?  ?  ?   ? ?Current Medications (09/08/2021):  This is the current hospital active medication list ?Current Facility-Administered Medications  ?Medication Dose Route Frequency Provider Last Rate Last Admin  ? acetaminophen (TYLENOL) tablet 500 mg  500 mg Oral Q6H PRN Irene Pap N, DO      ? aspirin EC tablet 81 mg  81 mg Oral Daily Irene Pap N, DO   81 mg at 09/08/21 4970  ? atorvastatin (LIPITOR) tablet 40 mg  40 mg Oral Daily Irene Pap N, DO   40 mg at 09/08/21 2637  ? diclofenac Sodium (VOLTAREN) 1 % topical gel 2 g  2 g Topical BID Hall, Carole N, DO      ? donepezil (ARICEPT) tablet 5 mg  5 mg Oral QHS Hall, Carole N, DO   5 mg at 09/08/21 0100  ? enoxaparin (LOVENOX) injection 52.5 mg  0.5 mg/kg Subcutaneous Q24H Hall, Carole N, DO   52.5 mg at 09/08/21 8588  ? gabapentin (  NEURONTIN) capsule 300 mg  300 mg Oral TID Irene Pap N, DO   300 mg at 09/08/21 1539  ? HYDROmorphone (DILAUDID) injection 0.5 mg  0.5 mg Intravenous Q4H PRN Irene Pap N, DO      ? lidocaine (LIDODERM) 5 % 1 patch  1 patch Transdermal Q24H Lucrezia Starch, MD   1 patch at 09/07/21 2048  ? meropenem (MERREM) 1 g in sodium chloride 0.9 % 100 mL IVPB  1 g Intravenous Q8H Pernell Dupre, RPH   Stopped at 09/08/21 1458  ? ondansetron (ZOFRAN) injection 4 mg  4 mg Intravenous Q6H PRN Irene Pap N, DO      ? oxyCODONE (Oxy IR/ROXICODONE) immediate release tablet 5 mg  5 mg Oral Q4H PRN Irene Pap N, DO      ? pantoprazole (PROTONIX) EC tablet 40 mg  40 mg Oral Q breakfast Irene Pap N, DO   40 mg at 09/08/21  3016  ? polyethylene glycol (MIRALAX / GLYCOLAX) packet 17 g  17 g Oral Daily PRN Irene Pap N, DO      ? senna-docusate (Senokot-S) tablet 1 tablet  1 tablet Oral QHS Irene Pap N, DO   1 tablet at 09/08/21 0200  ? tamsulosin (FLOMAX) capsule 0.4 mg  0.4 mg Oral Daily Irene Pap N, DO   0.4 mg at 09/08/21 0109  ? traZODone (DESYREL) tablet 50-100 mg  50-100 mg Oral QHS Hall, Carole N, DO   50 mg at 09/08/21 0100  ? vitamin B-12 (CYANOCOBALAMIN) tablet 1,000 mcg  1,000 mcg Oral Daily Irene Pap N, DO   1,000 mcg at 09/08/21 3235  ? ? ? ?Discharge Medications: ?Please see discharge summary for a list of discharge medications. ? ?Relevant Imaging Results: ? ?Relevant Lab Results: ? ? ?Additional Information ?SS#: [id number]. ? ?Conception Oms, RN ? ? ? ? ?

## 2021-09-08 NOTE — Evaluation (Signed)
Occupational Therapy Evaluation ?Patient Details ?Name: Adam Rios. ?MRN: [id number] ?DOB: 12-16-1940 ?Today's Date: 09/08/2021 ? ? ?History of Present Illness 81 y.o. male with medical history significant for cognitive impairment, spinal stenosis of lumbar region at multiple levels, sleep disorder, CVA, renal cell carcinoma status post left nephrectomy, ventral hernia, neurogenic bladder with indwelling Foley catheter in place, recurrent UTIs, BPH, essential hypertension, hyperlipidemia, polyneuropathy, GERD, who presented to [facility] ED from home due to hurting all over.  Associated with generalized weakness and tremors in both hands.  Hand tremors started around 11 AM.  Sleeps in a recliner and could not use his legs this morning.  ? ?Clinical Impression ?  ?Patient presenting with decreased Ind in self care, balance, functional mobility/transfers, endurance, and safety awareness. Patient reports living at home with daughter and several other family members at baseline. However, his family is leaving today to go on a cruise for several days. Pt reports being able to ambulate short distances to bathroom for toileting needs and dressing himself. He has an aide that assists with shower 1x/wk. Family assist with IADLs. He sleeps in a lift chair at home. Patient needing +2 assistance to stand and take side steps with R lean and posterior bias noted. Patient will benefit from acute OT to increase overall independence in the areas of ADLs, functional mobility, and safety awareness in order to safely discharge to next venue of care.  ?   ? ?Recommendations for follow up therapy are one component of a multi-disciplinary discharge planning process, led by the attending physician.  Recommendations may be updated based on patient status, additional functional criteria and insurance authorization.  ? ?Follow Up Recommendations ? Skilled nursing-short term rehab (<3 hours/day)  ?  ?Assistance Recommended at Discharge Frequent  or constant Supervision/Assistance  ?Patient can return home with the following Two people to help with walking and/or transfers;A lot of help with bathing/dressing/bathroom;Help with stairs or ramp for entrance;Assist for transportation ? ?  ?Functional Status Assessment ? Patient has had a recent decline in their functional status and demonstrates the ability to make significant improvements in function in a reasonable and predictable amount of time.  ?Equipment Recommendations ? Other (comment) (defer to next venue of care)  ?  ?   ?Precautions / Restrictions Precautions ?Precautions: Fall  ? ?  ? ?Mobility Bed Mobility ?Overal bed mobility: Needs Assistance ?Bed Mobility: Supine to Sit, Sit to Supine ?  ?  ?Supine to sit: Min assist ?Sit to supine: Mod assist ?  ?General bed mobility comments: min A for trunk support to EOB but needing assist with B LEs to return to bed ?  ? ?Transfers ?Overall transfer level: Needs assistance ?Equipment used: Rolling walker (2 wheels) ?Transfers: Sit to/from Stand ?Sit to Stand: Min assist, Mod assist, +2 physical assistance ?  ?  ?  ?  ?  ?General transfer comment: min - mod of 2 for side steps with pt having posterior bias ?  ? ?  ?Balance Overall balance assessment: Needs assistance ?Sitting-balance support: Feet supported ?Sitting balance-Leahy Scale: Good ?Sitting balance - Comments: supervision for static sitting ?Postural control: Posterior lean, Right lateral lean ?Standing balance support: Reliant on assistive device for balance, During functional activity ?Standing balance-Leahy Scale: Zero ?Standing balance comment: +2 assist ?  ?  ?  ?  ?  ?  ?  ?  ?  ?  ?  ?   ? ?ADL either performed or assessed with clinical judgement  ? ?ADL  Overall ADL's : Needs assistance/impaired ?  ?  ?Grooming: Wash/dry hands;Wash/dry face;Sitting;Supervision/safety;Set up ?  ?  ?  ?  ?  ?  ?  ?  ?  ?  ?  ?  ?  ?  ?  ?  ?   ? ? ? ?Vision Patient Visual Report: No change from baseline ?   ?    ?   ?   ? ?Pertinent Vitals/Pain Pain Assessment ?Pain Assessment: No/denies pain  ? ? ? ?Extremity/Trunk Assessment Upper Extremity Assessment ?Upper Extremity Assessment: Generalized weakness ?  ?Lower Extremity Assessment ?Lower Extremity Assessment: Generalized weakness ?  ?  ?  ?Communication Communication ?Communication: No difficulties ?  ?Cognition Arousal/Alertness: Awake/alert ?Behavior During Therapy: [facility] for tasks assessed/performed ?Overall Cognitive Status: Within Functional Limits for tasks assessed ?  ?  ?  ?  ?  ?  ?  ?  ?  ?  ?  ?  ?  ?  ?  ?  ?  ?  ?  ?   ?   ?   ? ? ?Home Living Family/patient expects to be discharged to:: Private residence ?Living Arrangements: Children ?Available Help at Discharge: Available PRN/intermittently;Family ?Type of Home: House ?Home Access: Stairs to enter ?Entrance Stairs-Number of Steps: 1 ?Entrance Stairs-Rails: None ?Home Layout: One level ?  ?  ?Bathroom Shower/Tub: Walk-in shower ?  ?  ?  ?  ?Home Equipment: Air cabin crew (4 wheels) ?  ?  ?  ? ?  ?Prior Functioning/Environment Prior Level of Function : Needs assist ?  ?  ?  ?  ?  ?  ?Mobility Comments: Pt reports he ambulates short distances with RW without assistance. ?ADLs Comments: Pt reports toileting independently but has home health RN that comes 1x/wk to provide min A for bathing. ?  ? ?  ?  ?OT Problem List: Decreased strength;Decreased activity tolerance;Impaired balance (sitting and/or standing);Decreased knowledge of use of DME or AE;Decreased safety awareness;Cardiopulmonary status limiting activity;Increased edema ?  ?   ?OT Treatment/Interventions: Self-care/ADL training;Therapeutic exercise;Therapeutic activities;Energy conservation;DME and/or AE instruction;Manual therapy;Balance training;Patient/family education  ?  ?OT Goals(Current goals can be found in the care plan section) Acute Rehab OT Goals ?Patient Stated Goal: to get stronger ?OT Goal Formulation: With patient ?Time For Goal  Achievement: 09/22/21 ?Potential to Achieve Goals: Good ?ADL Goals ?Pt Will Perform Grooming: standing;with mod assist ?Pt Will Perform Lower Body Dressing: with min assist;sit to/from stand ?Pt Will Transfer to Toilet: with min assist;ambulating ?Pt Will Perform Toileting - Clothing Manipulation and hygiene: with min assist;sit to/from stand  ?OT Frequency: Min 2X/week ?  ? ?Co-evaluation PT/OT/SLP Co-Evaluation/Treatment: Yes ?Reason for Co-Treatment: For patient/therapist safety;To address functional/ADL transfers ?PT goals addressed during session: Mobility/safety with mobility ?OT goals addressed during session: ADL's and self-care ?  ? ?  ?AM-PAC OT "6 Clicks" Daily Activity     ?Outcome Measure Help from another person eating meals?: None ?Help from another person taking care of personal grooming?: A Little ?Help from another person toileting, which includes using toliet, bedpan, or urinal?: Total ?Help from another person bathing (including washing, rinsing, drying)?: A Lot ?Help from another person to put on and taking off regular upper body clothing?: A Little ?Help from another person to put on and taking off regular lower body clothing?: A Lot ?6 Click Score: 15 ?  ?End of Session Equipment Utilized During Treatment: Rolling walker (2 wheels) ?Nurse Communication: Mobility status ? ?Activity Tolerance: Patient tolerated treatment well ?Patient left:  in bed;with call bell/phone within reach;with bed alarm set ? ?OT Visit Diagnosis: Unsteadiness on feet (R26.81);Muscle weakness (generalized) (M62.81);Repeated falls (R29.6)  ?              ?Time: 1019-1050 ?OT Time Calculation (min): 31 min ?Charges:  OT General Charges ?$OT Visit: 1 Visit ?OT Evaluation ?$OT Eval Moderate Complexity: 1 Mod ?OT Treatments ?$Therapeutic Activity: 8-22 mins ? ?Darleen Crocker, MS, OTR/L , CBIS ?ascom [phone]  ?09/08/21, 1:25 PM  ?

## 2021-09-08 NOTE — TOC Progression Note (Signed)
Transition of Care (TOC) - Progression Note  ? ? ?Patient Details  ?Name: Curtez Brallier. ?MRN: [id number] ?Date of Birth: March 21, 1941 ? ?Transition of Care (TOC) CM/SW Contact  ?Conception Oms, RN ?Phone Number: ?09/08/2021, 3:53 PM ? ?Clinical Narrative:   Bedsearch sent, FL2 completed PASSR obtained ? ? ? ?Expected Discharge Plan: [place] ?Barriers to Discharge: Continued Medical Work up ? ?Expected Discharge Plan and Services ?Expected Discharge Plan: [place] ?  ?Discharge Planning Services: CM Consult ?  ?Living arrangements for the past 2 months: [place] ?                ?DME Arranged: N/A ?  ?  ?  ?  ?HH Arranged: PT, OT, NA, RN ?[place] Agency: [place] ([place]) ?Date HH Agency Contacted: 09/08/21 ?Time [place]: 1024 ?Representative spoke with at [place]: Corene Cornea ? ? ?Social Determinants of Health (SDOH) Interventions ?  ? ?Readmission Risk Interventions ?   ? View : No data to display.  ?  ?  ?  ? ? ?

## 2021-09-09 ENCOUNTER — Inpatient Hospital Stay: Payer: Medicare Other

## 2021-09-09 DIAGNOSIS — T83511A Infection and inflammatory reaction due to indwelling urethral catheter, initial encounter: Secondary | ICD-10-CM

## 2021-09-09 DIAGNOSIS — K592 Neurogenic bowel, not elsewhere classified: Secondary | ICD-10-CM

## 2021-09-09 DIAGNOSIS — N390 Urinary tract infection, site not specified: Secondary | ICD-10-CM

## 2021-09-09 DIAGNOSIS — G629 Polyneuropathy, unspecified: Secondary | ICD-10-CM

## 2021-09-09 DIAGNOSIS — N179 Acute kidney failure, unspecified: Secondary | ICD-10-CM | POA: Diagnosis not present

## 2021-09-09 DIAGNOSIS — K5900 Constipation, unspecified: Secondary | ICD-10-CM

## 2021-09-09 DIAGNOSIS — R531 Weakness: Secondary | ICD-10-CM | POA: Diagnosis not present

## 2021-09-09 DIAGNOSIS — Z8673 Personal history of transient ischemic attack (TIA), and cerebral infarction without residual deficits: Secondary | ICD-10-CM

## 2021-09-09 DIAGNOSIS — N319 Neuromuscular dysfunction of bladder, unspecified: Secondary | ICD-10-CM | POA: Diagnosis not present

## 2021-09-09 LAB — PROCALCITONIN: Procalcitonin: <0.1 ng/mL

## 2021-09-09 MED ORDER — SORBITOL 70 % SOLN
30.0000 mL | Freq: Every day | Status: DC | PRN
  Filled 2021-09-09: qty 30

## 2021-09-09 MED ORDER — LACTULOSE 10 GM/15ML PO SOLN
20.0000 g | Freq: Every day | ORAL | Status: DC
  Administered 2021-09-09 – 2021-09-10 (×2): 20 g via ORAL
  Filled 2021-09-09 (×2): qty 30

## 2021-09-09 MED ORDER — SENNOSIDES-DOCUSATE SODIUM 8.6-50 MG PO TABS
2.0000 | ORAL_TABLET | Freq: Two times a day (BID) | ORAL | Status: DC
  Administered 2021-09-09 – 2021-09-10 (×2): 2 via ORAL
  Filled 2021-09-09 (×2): qty 2

## 2021-09-09 MED ORDER — POLYETHYLENE GLYCOL 3350 17 G PO PACK
17.0000 g | PACK | Freq: Every day | ORAL | Status: DC
  Administered 2021-09-09 – 2021-09-10 (×2): 17 g via ORAL
  Filled 2021-09-09 (×2): qty 1

## 2021-09-09 NOTE — Assessment & Plan Note (Addendum)
-   baseline creatinine ~ 0.8 ?- patient presents with increase in creat >0.3 mg/dL above baseline, creat increase >1.5x baseline presumed to have occurred within past 7 days PTA ?-Creatinine 1.56 on admission ?- s/p IVF ?- creat 1.09 on day of discharge  ? ?

## 2021-09-09 NOTE — TOC Progression Note (Signed)
Transition of Care (TOC) - Progression Note  ? ? ?Patient Details  ?Name: Adam Rios. ?MRN: [id number] ?Date of Birth: [birth year] ? ?Transition of Care (TOC) CM/SW Contact  ?Conception Oms, RN ?Phone Number: ?09/09/2021, 12:42 PM ? ?Clinical Narrative:   Bed offers reviewed, he chose [facility], [place] started ? ? ? ?Expected Discharge Plan: [place] ?Barriers to Discharge: Continued Medical Work up ? ?Expected Discharge Plan and Services ?Expected Discharge Plan: [place] ?  ?Discharge Planning Services: CM Consult ?  ?Living arrangements for the past 2 months: [place] ?                ?DME Arranged: N/A ?  ?  ?  ?  ?HH Arranged: PT, OT, NA, RN ?[place] Agency: [place] ([place]) ?Date HH Agency Contacted: 09/08/21 ?Time [place]: 1024 ?Representative spoke with at [place]: Corene Cornea ? ? ?Social Determinants of Health (SDOH) Interventions ?  ? ?Readmission Risk Interventions ?   ? View : No data to display.  ?  ?  ?  ? ? ?

## 2021-09-09 NOTE — Hospital Course (Addendum)
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  Associated with generalized weakness and tremors in both hands. ?He was noted to have an AKI and suspected UTI. He was started on IVF and meropenem due to history of ESBL E. Coli.  ?Urine culture grew E.Coli (non ESBL). He was de-escalated to amoxicillin to complete a total of 7 day course.  ?His foley cath was also exchanged on 09/09/21 in the hospital.  ?

## 2021-09-09 NOTE — Assessment & Plan Note (Addendum)
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

## 2021-09-09 NOTE — Assessment & Plan Note (Signed)
Continue Lipitor. Aspirin held secondary to initiation of Eliquis. Follow-up with cardiology. 

## 2021-09-09 NOTE — Progress Notes (Signed)
?Progress Note ? ? ? ?Adam Rios.   ?WUJ:[id number]  ?DOB: 18-Apr-1941  ?DOA: 09/07/2021     2 ?PCP: Dola Argyle, MD ? ?Initial CC: weakness, tremors ? 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  Associated with generalized weakness and tremors in both hands. ?He was noted to have an AKI and suspected UTI. He was started on IVF and meropenem due to history of ESBL E. Coli.  ? ?Interval History:  ?Resting in bed comfortably when seen today.  Still having some tremors in his hands.  Feeling a little better in general.  Endorses flatus but still no bowel movement. ? ?Assessment and Plan: ?* AKI (acute kidney injury) ([place]) ?- baseline creatinine ~ 0.8 ?- patient presents with increase in creat >0.3 mg/dL above baseline, creat increase >1.5x baseline presumed to have occurred within past 7 days PTA ?-Creatinine 1.56 on admission ?- s/p IVF ? ? ?UTI (urinary tract infection) ?- history of E.Coli ESBL ?- continue meropenem ?- UCx growing Ecoli, await further sens ?- change foley today; not done on admission; patient also endorses purulent drainage from penis  ? ?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 ?- Bowel sounds adequate but slightly distended abdomen  ?-Abdominal x-ray today showed moderate stool burden ?- Laxative regimen modified ? ?History of CVA (cerebrovascular accident) ?- Continue aspirin and Lipitor ? ?Neuropathy ?- Continue gabapentin ? ?Renal cell cancer ([place]) ?- History of left nephrectomy ? ?Neurogenic bladder ?- continue chronic foley  ? ?Fusion of spine of lumbar region ?- Chronic pain ?-Continue pain control  ? ?Essential  hypertension ?- Amlodipine and losartan on hold on admission in the setting of low BP ? ? ?Old records reviewed in assessment of this patient ? ?Antimicrobials: ?Cefepime 09/07/2021 x 1 ?Meropenem 09/08/2021 >> current ? ?DVT prophylaxis:  ?  Lovenox ? ? ?Code Status:   Code Status: Full Code ? ?Disposition Plan: SNF ?Status is: Inpatient ? ?Objective: ?Blood pressure 117/75, pulse 83, temperature 98.3 ?F (36.8 ?C), temperature source Oral, resp. rate 17, height '6\' 1"'$  (1.854 m), weight 106.6 kg, SpO2 94 %.  ?Examination:  ?Physical Exam ?Constitutional:   ?   General: He is not in acute distress. ?   Appearance: Normal appearance.  ?HENT:  ?   Head: Normocephalic and atraumatic.  ?   Mouth/Throat:  ?   Mouth: Mucous membranes are moist.  ?Eyes:  ?   Extraocular Movements: Extraocular movements intact.  ?Cardiovascular:  ?   Rate and Rhythm: Normal rate and regular rhythm.  ?   Heart sounds: Normal heart sounds.  ?Pulmonary:  ?   Effort: Pulmonary effort is normal. No respiratory distress.  ?   Breath sounds: Normal breath sounds. No wheezing.  ?Abdominal:  ?   General: Bowel sounds are normal. There is distension.  ?   Palpations: Abdomen is soft.  ?   Tenderness: There is no abdominal tenderness.  ?Musculoskeletal:     ?   General: Normal range of motion.  ?   Cervical back: Normal range of motion and neck supple.  ?Skin: ?   General: Skin is warm and dry.  ?Neurological:  ?   Mental Status: He is alert and  oriented to person, place, and time.  ?Psychiatric:     ?   Mood and Affect: Mood normal.     ?   Behavior: Behavior normal.  ?  ? ?Consultants:  ? ? ?Procedures:  ? ? ?Data Reviewed: ?Results for orders placed or performed during the hospital encounter of 09/07/21 (from the past 24 hour(s))  ?Procalcitonin     Status: None  ? Collection Time: 09/09/21  4:01 AM  ?Result Value Ref Range  ? Procalcitonin <0.10 ng/mL  ?  ?I have Reviewed nursing notes, Vitals, and Lab results since pt's last encounter. Pertinent lab  results : see above ?I have ordered test including BMP, CBC, Mg ?I have reviewed the last note from staff over past 24 hours ?I have discussed pt's care plan and test results with nursing staff, case manager ? ? LOS: 2 days  ? ?Dwyane Dee, MD ?[facility] ?09/09/2021, 3:17 PM ? ?

## 2021-09-09 NOTE — Assessment & Plan Note (Addendum)
-   continue losartan and spironolactone ?- PRN lasix  ?

## 2021-09-09 NOTE — Assessment & Plan Note (Signed)
-   History of left nephrectomy ?

## 2021-09-09 NOTE — Assessment & Plan Note (Signed)
-   Chronic pain ?-Continue pain control  ?

## 2021-09-09 NOTE — Assessment & Plan Note (Addendum)
-   Bowel sounds adequate but slightly distended abdomen  ?-Abdominal x-ray showed moderate stool burden ?- continue laxatives at discharge as well  ?

## 2021-09-09 NOTE — Assessment & Plan Note (Signed)
-   continue chronic foley  ?

## 2021-09-09 NOTE — Assessment & Plan Note (Signed)
Continue gabapentin.

## 2021-09-10 DIAGNOSIS — R531 Weakness: Secondary | ICD-10-CM | POA: Diagnosis not present

## 2021-09-10 DIAGNOSIS — N179 Acute kidney failure, unspecified: Secondary | ICD-10-CM | POA: Diagnosis not present

## 2021-09-10 DIAGNOSIS — K5900 Constipation, unspecified: Secondary | ICD-10-CM | POA: Diagnosis not present

## 2021-09-10 DIAGNOSIS — T83511A Infection and inflammatory reaction due to indwelling urethral catheter, initial encounter: Secondary | ICD-10-CM | POA: Diagnosis not present

## 2021-09-10 DIAGNOSIS — K592 Neurogenic bowel, not elsewhere classified: Secondary | ICD-10-CM | POA: Diagnosis not present

## 2021-09-10 LAB — CBC WITH DIFFERENTIAL/PLATELET
Abs Immature Granulocytes: 0.04 10*3/uL (ref 0.00–0.07)
Basophils Absolute: 0 10*3/uL (ref 0.0–0.1)
Basophils Relative: 0 %
Eosinophils Absolute: 0.2 10*3/uL (ref 0.0–0.5)
Eosinophils Relative: 2 %
HCT: 37.7 % — ABNORMAL LOW (ref 39.0–52.0)
Hemoglobin: 11.7 g/dL — ABNORMAL LOW (ref 13.0–17.0)
Immature Granulocytes: 1 %
Lymphocytes Relative: 27 %
Lymphs Abs: 2.1 10*3/uL (ref 0.7–4.0)
MCH: 28.2 pg (ref 26.0–34.0)
MCHC: 31 g/dL (ref 30.0–36.0)
MCV: 90.8 fL (ref 80.0–100.0)
Monocytes Absolute: 0.7 10*3/uL (ref 0.1–1.0)
Monocytes Relative: 9 %
Neutro Abs: 4.9 10*3/uL (ref 1.7–7.7)
Neutrophils Relative %: 61 %
Platelets: 192 10*3/uL (ref 150–400)
RBC: 4.15 MIL/uL — ABNORMAL LOW (ref 4.22–5.81)
RDW: 14 % (ref 11.5–15.5)
WBC: 7.9 10*3/uL (ref 4.0–10.5)
nRBC: 0 % (ref 0.0–0.2)

## 2021-09-10 LAB — URINE CULTURE: Culture: >=100000 — AB

## 2021-09-10 LAB — BASIC METABOLIC PANEL
Anion gap: 6 (ref 5–15)
BUN: 17 mg/dL (ref 8–23)
CO2: 26 mmol/L (ref 22–32)
Calcium: 8.4 mg/dL — ABNORMAL LOW (ref 8.9–10.3)
Chloride: 103 mmol/L (ref 98–111)
Creatinine, Ser: 1.09 mg/dL (ref 0.61–1.24)
GFR, Estimated: >60 mL/min (ref >60)
Glucose, Bld: 92 mg/dL (ref 70–99)
Potassium: 4.2 mmol/L (ref 3.5–5.1)
Sodium: 135 mmol/L (ref 135–145)

## 2021-09-10 LAB — MAGNESIUM: Magnesium: 2.2 mg/dL (ref 1.7–2.4)

## 2021-09-10 MED ORDER — AMOXICILLIN 500 MG PO CAPS
500.0000 mg | ORAL_CAPSULE | Freq: Three times a day (TID) | ORAL | Status: DC
Start: 2021-09-10 — End: 2021-09-10
  Administered 2021-09-10: 500 mg via ORAL
  Filled 2021-09-10 (×2): qty 1

## 2021-09-10 MED ORDER — SENNOSIDES-DOCUSATE SODIUM 8.6-50 MG PO TABS: 1.0000 | ORAL_TABLET | Freq: Two times a day (BID) | ORAL | Status: DC

## 2021-09-10 MED ORDER — CEFDINIR 300 MG PO CAPS
300.0000 mg | ORAL_CAPSULE | Freq: Two times a day (BID) | ORAL | Status: DC
  Filled 2021-09-10: qty 1

## 2021-09-10 MED ORDER — AMOXICILLIN 500 MG PO CAPS: 500.0000 mg | ORAL_CAPSULE | Freq: Three times a day (TID) | ORAL | 0 refills | Status: AC

## 2021-09-10 NOTE — Care Management Important Message (Signed)
Important Message ? ?Patient Details  ?Name: Adam Rios. ?MRN: [id number] ?Date of Birth: [birth year] ? ? ?Medicare Important Message Given:  Yes ? ?Patient is in an isolation room so I reviewed the Important Message from Medicare with him by phone [phone]) and he agrees with his discharge. I wished him well and thanked him for his time.  ? ? ?Juliann Pulse A Leiland Mihelich ?09/10/2021, 11:34 AM ?

## 2021-09-10 NOTE — TOC Progression Note (Signed)
Transition of Care (TOC) - Progression Note  ? ? ?Patient Details  ?Name: Adam Rios. ?MRN: [id number] ?Date of Birth: 11-Dec-1940 ? ?Transition of Care (TOC) CM/SW Contact  ?Conception Oms, RN ?Phone Number: ?09/10/2021, 10:55 AM ? ?Clinical Narrative:    ?DC summary sent to [place] ?EMS called ?The patient will notify his family, There is one ahead of him on EMS list ? ? ?Expected Discharge Plan: [place] ?Barriers to Discharge: Continued Medical Work up ? ?Expected Discharge Plan and Services ?Expected Discharge Plan: [place] ?  ?Discharge Planning Services: CM Consult ?  ?Living arrangements for the past 2 months: [place] ?Expected Discharge Date: 09/10/21               ?DME Arranged: N/A ?  ?  ?  ?  ?HH Arranged: PT, OT, NA, RN ?[place] Agency: [place] ([place]) ?Date HH Agency Contacted: 09/08/21 ?Time [place]: 1024 ?Representative spoke with at [place]: Corene Cornea ? ? ?Social Determinants of Health (SDOH) Interventions ?  ? ?Readmission Risk Interventions ?   ? View : No data to display.  ?  ?  ?  ? ? ?

## 2021-09-10 NOTE — Discharge Summary (Signed)
?Physician Discharge Summary ?  ?Patient: Adam Rios. MRN: [id number] DOB: July 27, 1940  ?Admit date:     09/07/2021  ?Discharge date: 09/10/21  ?Discharge Physician: Dwyane Dee  ? ?PCP: Dola Argyle, MD  ? ?Recommendations at discharge:  ? ? Continue following with urology after discharge from rehab (monthly cath exchanges) ? ?Discharge Diagnoses: ?Active Problems: ?  UTI (urinary tract infection) ?  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  Essential hypertension ?  Fusion of spine of lumbar region ?  Neurogenic bladder ?  Renal cell cancer ([place]) ?  Neuropathy ?  History of CVA (cerebrovascular accident) ? ?Principal Problem (Resolved): ?  AKI (acute kidney injury) ([place]) ? ?Hospital Course: ?Mr. 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  Associated with generalized weakness and tremors in both hands. ?He was noted to have an AKI and suspected UTI. He was started on IVF and meropenem due to history of ESBL E. Coli.  ?Urine culture grew E.Coli (non ESBL). He was de-escalated to amoxicillin to complete a total of 7 day course.  ?His foley cath was also exchanged on 09/09/21 in the hospital.  ? ?Assessment and Plan: ?* AKI (acute kidney injury) (HCC)-resolved as of 09/10/2021 ?- baseline creatinine ~ 0.8 ?- patient presents with increase in creat >0.3 mg/dL above baseline, creat increase >1.5x baseline presumed to have occurred within past 7 days PTA ?-Creatinine 1.56 on admission ?- s/p IVF ?- creat 1.09 on day of discharge  ? ? ?UTI (urinary tract infection) ?- history of E.Coli ESBL ?- s/p 2 days meropenem in hospital ?- UCx grew non ESBL E.coli sensitive to  pcn; de-escalate to amox to complete 7 days total ?- foley exchanged on 09/09/21 ? 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 ?History of CVA (cerebrovascular accident) ?- Continue aspirin and Lipitor ? ?Neuropathy ?- Continue gabapentin ? ?Renal cell cancer ([place]) ?- History of left nephrectomy ? ?Neurogenic bladder ?- continue chronic foley  ? ?Fusion of spine of lumbar region ?- Chronic pain ?-Continue pain control  ? ?Essential hypertension ?- continue losartan and spironolactone ?- PRN lasix  ? ? ? ?  ? ? ?Consultants:  ?Procedures performed:   ?Disposition: Skilled nursing facility ?Diet recommendation:  ?Discharge Diet Orders (From admission, onward)  ? ?  Start     Ordered  ? 09/10/21 0000  Diet - low sodium heart healthy       ? 09/10/21 1045  ? ?  ?  ? ?  ? ?Cardiac diet ?DISCHARGE MEDICATION: ?Allergies as of 09/10/2021   ? ?   Reactions  ? Fentanyl Nausea Only  ? Oxycodone Nausea And Vomiting  ? ?  ? ?  ?Medication List  ?  ? ?STOP taking these medications   ? ?amLODipine 10 MG tablet ?Commonly known as: NORVASC ?  ?clopidogrel 75 MG tablet ?Commonly known as: PLAVIX ?  ? ?  ? ?TAKE these medications   ? ?acetaminophen 325 MG tablet ?Commonly known as: TYLENOL ?Take 500 mg by mouth every 6 (six) hours as needed. ?  ?amoxicillin 500 MG capsule ?  Commonly known as: AMOXIL ?Take 1 capsule (500 mg total) by mouth 3 (three) times daily for 5 days. ?  ?aspirin 81 MG EC tablet ?Take 81 mg by mouth daily. ?  ?atorvastatin 40 MG tablet ?Commonly known as: LIPITOR ?Take 40 mg by mouth daily. ?  ?diclofenac Sodium 1 % Gel ?Commonly known as: VOLTAREN ?Apply 2 g topically 2 (two) times daily. ?  ?donepezil 5 MG tablet ?Commonly known as: ARICEPT ?Take 5 mg by mouth at bedtime. ?  ?furosemide 20 MG tablet ?Commonly known as: LASIX ?Take 20 mg by mouth daily as needed. ?   ?gabapentin 300 MG capsule ?Commonly known as: NEURONTIN ?Take 300 mg by mouth 3 (three) times daily. ?  ?lidocaine 5 % ?Commonly known as: LIDODERM ?Place 2 patches onto the skin daily. ?  ?losartan 25 MG tablet ?Commonly known as: COZAAR ?Take 25 mg by mouth daily. ?  ?mirabegron ER 25 MG Tb24 tablet ?Commonly known as: MYRBETRIQ ?Take 25 mg by mouth daily. ?  ?omeprazole 20 MG capsule ?Commonly known as: PRILOSEC ?Take 1 capsule (20 mg total) by mouth daily. ?  ?senna-docusate 8.6-50 MG tablet ?Commonly known as: Senokot-S ?Take 1 tablet by mouth 2 (two) times daily. ?  ?spironolactone 25 MG tablet ?Commonly known as: ALDACTONE ?Take 25 mg by mouth every morning. ?  ?tamsulosin 0.4 MG Caps capsule ?Commonly known as: FLOMAX ?Take 0.4 mg by mouth daily. ?  ?tiZANidine 4 MG tablet ?Commonly known as: ZANAFLEX ?Take 1 tablet (4 mg total) by mouth every 6 (six) hours as needed for muscle spasms. ?  ?traZODone 50 MG tablet ?Commonly known as: DESYREL ?Take 50-100 mg by mouth at bedtime. ?  ?vitamin B-12 1000 MCG tablet ?Commonly known as: CYANOCOBALAMIN ?Take 1,000 mcg by mouth daily. ?  ? ?  ? ? Contact information for after-discharge care   ? ? Destination   ? ? [facility] .   ?Service: Skilled Nursing ?Contact information: ?[address] ?[place] ?[phone] ? ?  ?  ? ?  ?  ? ?  ?  ? ?  ? ?Discharge Exam: ?Filed Weights  ? 09/07/21 1736 09/10/21 0500  ?Weight: 106.6 kg 106 kg  ? ?Physical Exam ?Constitutional:   ?   General: He is not in acute distress. ?   Appearance: Normal appearance.  ?HENT:  ?   Head: Normocephalic and atraumatic.  ?   Mouth/Throat:  ?   Mouth: Mucous membranes are moist.  ?Eyes:  ?   Extraocular Movements: Extraocular movements intact.  ?Cardiovascular:  ?   Rate and Rhythm: Normal rate and regular rhythm.  ?   Heart sounds: Normal heart sounds.  ?Pulmonary:  ?   Effort:  Pulmonary effort is normal. No respiratory distress.  ?   Breath sounds: Normal breath sounds. No wheezing.  ?Abdominal:  ?   General: Bowel sounds are normal.  ?   Palpations: Abdomen is soft.  ?   Tenderness: There is no abdominal tenderness.  ?Musculoskeletal:     ?   General: Normal range of motion.  ?   Cervical back: Normal range of motion and neck supple.  ?   Comments: 1+ LE edema B/L  ?Skin: ?   General: Skin is warm and dry.  ?Neurological:  ?   Mental Status: He is alert and oriented to person, place, and time.  ?Psychiatric:     ?  Mood and Affect: Mood normal.     ?   Behavior: Behavior normal.  ?, ? ?Condition at discharge: stable ? ?The results of significant diagnostics from this hospitalization (including imaging, microbiology, ancillary and laboratory) are listed below for reference.  ? ?Imaging Studies: ?CT HEAD WO CONTRAST (5MM) ? ?Result Date: 09/07/2021 ?CLINICAL DATA:  Neuro deficit, acute, stroke suspected EXAM: CT HEAD WITHOUT CONTRAST TECHNIQUE: Contiguous axial images were obtained from the base of the skull through the vertex without intravenous contrast. RADIATION DOSE REDUCTION: This exam was performed according to the departmental dose-optimization program which includes automated exposure control, adjustment of the mA and/or kV according to patient size and/or use of iterative reconstruction technique. COMPARISON:  Head CT dated May 14, 2021 FINDINGS: Evaluation is limited secondary to motion Brain: No evidence of acute infarction, hemorrhage, hydrocephalus, extra-axial collection or mass lesion/mass effect. Encephalomalacia of the RIGHT occipital lobe consistent with the sequela of prior infarction. Periventricular white matter hypodensities consistent with sequela of chronic microvascular ischemic disease. Unchanged size and configuration of the ventricular system with prominence of the ventricles in relation to generalized volume loss. Vascular: Vascular calcifications of the  vertebral arteries and carotid siphons. Skull: Normal. Negative for fracture or focal lesion. Sinuses/Orbits: No acute finding. Other: None IMPRESSION: No acute intracranial abnormality. If persistent concern, re

## 2021-09-10 NOTE — Plan of Care (Signed)
?  Problem: Education: ?Goal: Knowledge of General Education information will improve ?Description: Including pain rating scale, medication(s)/side effects and non-pharmacologic comfort measures ?Outcome: Adequate for Discharge ?  ?Problem: Health Behavior/Discharge Planning: ?Goal: Ability to manage health-related needs will improve ?Outcome: Adequate for Discharge ?  ?Problem: Clinical Measurements: ?Goal: Ability to maintain clinical measurements within normal limits will improve ?Outcome: Adequate for Discharge ?Goal: Will remain free from infection ?Outcome: Adequate for Discharge ?Goal: Diagnostic test results will improve ?Outcome: Adequate for Discharge ?Goal: Respiratory complications will improve ?Outcome: Adequate for Discharge ?Goal: Cardiovascular complication will be avoided ?Outcome: Adequate for Discharge ?  ?Problem: Activity: ?Goal: Risk for activity intolerance will decrease ?Outcome: Adequate for Discharge ?  ?Problem: Nutrition: ?Goal: Adequate nutrition will be maintained ?Outcome: Adequate for Discharge ?  ?Problem: Coping: ?Goal: Level of anxiety will decrease ?Outcome: Adequate for Discharge ?  ?Problem: Elimination: ?Goal: Will not experience complications related to bowel motility ?Outcome: Adequate for Discharge ?Goal: Will not experience complications related to urinary retention ?Outcome: Adequate for Discharge ?  ?Problem: Pain Managment: ?Goal: General experience of comfort will improve ?Outcome: Adequate for Discharge ?  ?Problem: Safety: ?Goal: Ability to remain free from injury will improve ?Outcome: Adequate for Discharge ?  ?Problem: Skin Integrity: ?Goal: Risk for impaired skin integrity will decrease ?Outcome: Adequate for Discharge ?  ?Problem: Acute Rehab OT Goals (only OT should resolve) ?Goal: Pt. Will Perform Grooming ?Outcome: Adequate for Discharge ?Goal: Pt. Will Perform Lower Body Dressing ?Outcome: Adequate for Discharge ?Goal: Pt. Will Transfer To Toilet ?Outcome:  Adequate for Discharge ?Goal: Pt. Will Perform Toileting-Clothing Manipulation ?Outcome: Adequate for Discharge ?  ?Problem: Acute Rehab PT Goals(only PT should resolve) ?Goal: Patient Will Transfer Sit To/From Stand ?Outcome: Adequate for Discharge ?Goal: Pt Will Ambulate ?Outcome: Adequate for Discharge ?Goal: Pt Will Go Up/Down Stairs ?Outcome: Adequate for Discharge ?  ?

## 2021-09-10 NOTE — TOC Progression Note (Signed)
Transition of Care (TOC) - Progression Note  ? ? ?Patient Details  ?Name: Adam Rios. ?MRN: [id number] ?Date of Birth: 04-23-1941 ? ?Transition of Care (TOC) CM/SW Contact  ?Conception Oms, RN ?Phone Number: ?09/10/2021, 9:38 AM ? ?Clinical Narrative:    ?Provided Ins approval information to [place] at [employer] ? ? ?Expected Discharge Plan: [place] ?Barriers to Discharge: Continued Medical Work up ? ?Expected Discharge Plan and Services ?Expected Discharge Plan: [place] ?  ?Discharge Planning Services: CM Consult ?  ?Living arrangements for the past 2 months: [place] ?                ?DME Arranged: N/A ?  ?  ?  ?  ?HH Arranged: PT, OT, NA, RN ?[place] Agency: [place] ([place]) ?Date HH Agency Contacted: 09/08/21 ?Time [place]: 1024 ?Representative spoke with at [place]: Corene Cornea ? ? ?Social Determinants of Health (SDOH) Interventions ?  ? ?Readmission Risk Interventions ?   ? View : No data to display.  ?  ?  ?  ? ? ?

## 2021-09-12 LAB — CULTURE, BLOOD (ROUTINE X 2)
Culture: NO GROWTH
Culture: NO GROWTH

## 2021-11-15 ENCOUNTER — Other Ambulatory Visit: Payer: Self-pay

## 2021-11-15 ENCOUNTER — Emergency Department
Admission: EM | Admit: 2021-11-15 | Discharge: 2021-11-15 | Disposition: A | Discharge status: Home / Self Care | Payer: Medicare Other | Attending: Emergency Medicine | Admitting: Emergency Medicine

## 2021-11-15 DIAGNOSIS — Z85528 Personal history of other malignant neoplasm of kidney: Secondary | ICD-10-CM | POA: Diagnosis not present

## 2021-11-15 DIAGNOSIS — Z79899 Other long term (current) drug therapy: Secondary | ICD-10-CM | POA: Diagnosis not present

## 2021-11-15 DIAGNOSIS — N390 Urinary tract infection, site not specified: Secondary | ICD-10-CM | POA: Insufficient documentation

## 2021-11-15 DIAGNOSIS — Z7982 Long term (current) use of aspirin: Secondary | ICD-10-CM | POA: Diagnosis not present

## 2021-11-15 DIAGNOSIS — T83091A Other mechanical complication of indwelling urethral catheter, initial encounter: Secondary | ICD-10-CM | POA: Diagnosis not present

## 2021-11-15 DIAGNOSIS — R339 Retention of urine, unspecified: Secondary | ICD-10-CM | POA: Diagnosis present

## 2021-11-15 DIAGNOSIS — I10 Essential (primary) hypertension: Secondary | ICD-10-CM | POA: Diagnosis not present

## 2021-11-15 LAB — URINALYSIS, ROUTINE W REFLEX MICROSCOPIC
Bilirubin Urine: NEGATIVE
Glucose, UA: NEGATIVE mg/dL
Ketones, ur: NEGATIVE mg/dL
Nitrite: NEGATIVE
Protein, ur: 30 mg/dL — AB
RBC / HPF: >50 RBC/hpf — ABNORMAL HIGH (ref 0–5)
Specific Gravity, Urine: 1.015 (ref 1.005–1.030)
WBC, UA: >50 WBC/hpf — ABNORMAL HIGH (ref 0–5)
pH: 5 (ref 5.0–8.0)

## 2021-11-15 MED ORDER — ONDANSETRON 4 MG PO TBDP: 4.0000 mg | ORAL_TABLET | Freq: Four times a day (QID) | ORAL | 0 refills | Status: DC | PRN

## 2021-11-15 MED ORDER — ONDANSETRON HCL 4 MG/2ML IJ SOLN
INTRAMUSCULAR | Status: AC
  Administered 2021-11-15: 4 mg via INTRAVENOUS
  Filled 2021-11-15: qty 2

## 2021-11-15 MED ORDER — ALUM & MAG HYDROXIDE-SIMETH 200-200-20 MG/5ML PO SUSP
30.0000 mL | Freq: Once | ORAL | Status: AC
  Administered 2021-11-15: 30 mL via ORAL
  Filled 2021-11-15: qty 30

## 2021-11-15 MED ORDER — CEFTRIAXONE SODIUM 1 G IJ SOLR
1.0000 g | Freq: Once | INTRAMUSCULAR | Status: AC
  Administered 2021-11-15: 1 g via INTRAVENOUS
  Filled 2021-11-15: qty 10

## 2021-11-15 MED ORDER — ONDANSETRON HCL 4 MG/2ML IJ SOLN: 4.0000 mg | Freq: Once | INTRAMUSCULAR | Status: AC

## 2021-11-15 MED ORDER — LIDOCAINE VISCOUS HCL 2 % MT SOLN
15.0000 mL | Freq: Once | OROMUCOSAL | Status: AC
  Administered 2021-11-15: 15 mL via ORAL
  Filled 2021-11-15: qty 15

## 2021-11-15 MED ORDER — ONDANSETRON 4 MG PO TBDP: 4.0000 mg | ORAL_TABLET | Freq: Once | ORAL | Status: DC

## 2021-11-15 MED ORDER — PANTOPRAZOLE SODIUM 40 MG IV SOLR
40.0000 mg | Freq: Once | INTRAVENOUS | Status: AC
  Administered 2021-11-15: 40 mg via INTRAVENOUS
  Filled 2021-11-15: qty 10

## 2021-11-15 MED ORDER — CEFTRIAXONE SODIUM 1 G IJ SOLR: 1.0000 g | Freq: Once | INTRAMUSCULAR | Status: DC

## 2021-11-15 MED ORDER — CEFDINIR 300 MG PO CAPS: 300.0000 mg | ORAL_CAPSULE | Freq: Two times a day (BID) | ORAL | 0 refills | Status: DC

## 2021-11-15 NOTE — ED Notes (Signed)
Coude catheter placed per Ward DO

## 2021-11-15 NOTE — ED Notes (Addendum)
Attempted to drain foley with no success

## 2021-11-15 NOTE — ED Triage Notes (Signed)
Patient brought in from home by EMS. Patient reports abdominal pain related to his urinary catheter. Patient states pain started tonight and feels like his catheter blocked. Patient was discharged from [facility] yesterday for leg swelling and placed on lasix. Patient uses foley catheter chronically.

## 2021-11-15 NOTE — ED Provider Notes (Signed)
[facility] Provider Note    Event Date/Time   First MD Initiated Contact with Patient 11/15/21 0038     (approximate)   History   Abdominal Pain (Related to urinary catheter)   HPI  Adam Mcdaid. is a 81 y.o. male with history of hypertension, CVA, neurogenic bladder with indwelling Foley catheter in place who presents to the emergency department EMS for concerns for urinary retention.  States his Foley catheter was placed about 2 weeks ago.  States he was just discharged from [facility] today for volume overload with peripheral edema, anasarca and was given Lasix.  He states the last time he noticed any drainage from the catheter was about 11 PM.  Now having significant abdominal discomfort.  No vomiting, diarrhea, fever.  History provided by patient and EMS.    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[place])    Hypertension    Neurogenic bladder    Renal cell carcinoma ([place]) 2005   Renal disorder    Stroke ([place])     01/17/18, 12/21   Some weakness and cognitive decline    Past Surgical History:  Procedure Laterality Date   APPENDECTOMY     CATARACT EXTRACTION W/PHACO Right 07/22/2020   Procedure: CATARACT EXTRACTION PHACO AND INTRAOCULAR LENS PLACEMENT ([place]) RIGHT;  Surgeon: Adam Bear, MD;  Location: [facility];  Service: Ophthalmology;  Laterality: Right;  4.77 0:44.2   CATARACT EXTRACTION W/PHACO Left 08/05/2020   Procedure: CATARACT EXTRACTION PHACO AND INTRAOCULAR LENS PLACEMENT ([facility]) LEFT 10.73 01:13.8;  Surgeon: Adam Bear, MD;  Location: [place];  Service: Ophthalmology;  Laterality: Left;  Use eye stretcher not chair   HERNIA REPAIR     LUMBAR FUSION     NEPHRECTOMY Left 2005    MEDICATIONS:  Prior to Admission medications   Medication Sig Start Date End Date Taking?  Authorizing Provider  acetaminophen (TYLENOL) 325 MG tablet Take 500 mg by mouth every 6 (six) hours as needed. 08/14/14   [provider]  aspirin 81 MG EC tablet Take 81 mg by mouth daily.    [provider]  atorvastatin (LIPITOR) 40 MG tablet Take 40 mg by mouth daily.    [provider]  diclofenac Sodium (VOLTAREN) 1 % GEL Apply 2 g topically 2 (two) times daily. 05/11/21   [provider]  donepezil (ARICEPT) 5 MG tablet Take 5 mg by mouth at bedtime.    [provider]  furosemide (LASIX) 20 MG tablet Take 20 mg by mouth daily as needed.    [provider]  gabapentin (NEURONTIN) 300 MG capsule Take 300 mg by mouth 3 (three) times daily.    [provider]  lidocaine (LIDODERM) 5 % Place 2 patches onto the skin daily. 09/04/21   [provider]  losartan (COZAAR) 25 MG tablet Take 25 mg by mouth daily. 03/27/17 09/07/21  [provider]  mirabegron ER (MYRBETRIQ) 25 MG TB24 tablet Take 25 mg by mouth daily.    [provider]  omeprazole (PRILOSEC) 20 MG capsule Take 1 capsule (20 mg total) by mouth daily. 05/21/21   Jennye Boroughs, MD  senna-docusate (SENOKOT-S) 8.6-50 MG tablet Take 1 tablet by mouth 2 (two) times daily. 09/10/21   Dwyane Dee, MD  spironolactone (ALDACTONE) 25 MG tablet Take 25 mg by mouth every  morning. 03/08/21   [provider]  tamsulosin (FLOMAX) 0.4 MG CAPS capsule Take 0.4 mg by mouth daily.    [provider]  tiZANidine (ZANAFLEX) 4 MG tablet Take 1 tablet (4 mg total) by mouth every 6 (six) hours as needed for muscle spasms. 05/21/21   Jennye Boroughs, MD  traZODone (DESYREL) 50 MG tablet Take 50-100 mg by mouth at bedtime. 08/12/21   [provider]  vitamin B-12 (CYANOCOBALAMIN) 1000 MCG tablet Take 1,000 mcg by mouth daily.    [provider]    Physical Exam   Triage Vital Signs: ED Triage Vitals  Enc Vitals Group     BP --       Pulse Rate 11/15/21 0043 97     Resp 11/15/21 0043 19     Temp 11/15/21 0043 97.9 F (36.6 C)     Temp Source 11/15/21 0043 Oral     SpO2 11/15/21 0041 94 %     Weight 11/15/21 0045 235 lb (106.6 kg)     Height 11/15/21 0045 '6\' 1"'$  (1.854 m)     Head Circumference --      Peak Flow --      Pain Score 11/15/21 0043 10     Pain Loc --      Pain Edu? --      Excl. in [place]? --     Most recent vital signs: Vitals:   11/15/21 0043 11/15/21 0045  BP:  137/88  Pulse: 97 96  Resp: 19 15  Temp: 97.9 F (36.6 C)   SpO2: 94% 93%    CONSTITUTIONAL: Alert and oriented and responds appropriately to questions.  Elderly, chronically ill-appearing, appears uncomfortable HEAD: Normocephalic, atraumatic EYES: Conjunctivae clear, pupils appear equal, sclera nonicteric ENT: normal nose; moist mucous membranes NECK: Supple, normal ROM CARD: Regular and tachycardic; S1 and S2 appreciated; no murmurs, no clicks, no rubs, no gallops RESP: Normal chest excursion without splinting or tachypnea; breath sounds clear and equal bilaterally; no wheezes, no rhonchi, no rales, no hypoxia or respiratory distress, speaking full sentences ABD/GI: Normal bowel sounds; abdomen does not appear significantly distended but patient is slightly obese limiting exam.  He does seem to have some suprapubic tenderness.  Indwelling Foley catheter in place with cloudy, dark appearing urine but no hematuria or clots noted. BACK: The back appears normal EXT: Normal ROM in all joints; no deformity noted, no edema; no cyanosis SKIN: Normal color for age and race; warm; no rash on exposed skin NEURO: Moves all extremities equally, normal speech PSYCH: The patient's mood and manner are appropriate.   ED Results / Procedures / Treatments   LABS: (all labs ordered are listed, but only abnormal results are displayed) Labs Reviewed  URINALYSIS, ROUTINE W REFLEX MICROSCOPIC - Abnormal; Notable for the following components:       Result Value   Color, Urine YELLOW (*)    APPearance HAZY (*)    Hgb urine dipstick LARGE (*)    Protein, ur 30 (*)    Leukocytes,Ua LARGE (*)    RBC / HPF >50 (*)    WBC, UA >50 (*)    Bacteria, UA RARE (*)    All other components within normal limits  URINE CULTURE     EKG:  EKG Interpretation  Date/Time:  Saturday November 15 2021 00:45:03 EDT Ventricular Rate:  97 PR Interval:  210 QRS Duration: 146 QT Interval:  375 QTC Calculation: 477 R Axis:   44 Text Interpretation: Sinus  rhythm Borderline prolonged PR interval IVCD, consider atypical LBBB No significant change since last tracing Confirmed by Pryor Curia [phone]) on 11/15/2021 12:50:56 AM         RADIOLOGY: My personal review and interpretation of imaging:    I have personally reviewed all radiology reports.   No results found.   PROCEDURES:  Critical Care performed: No     Procedures    IMPRESSION / MDM / ASSESSMENT AND PLAN / ED COURSE  I reviewed the triage vital signs and the nursing notes.    Patient here with obstruction of his Foley catheter, urinary retention.     DIFFERENTIAL DIAGNOSIS (includes but not limited to):   UTI, Foley catheter obstruction, dislodged Foley catheter, urinary retention   Patient's presentation is most consistent with acute presentation with potential threat to life or bodily function.   PLAN: We will attempt to irrigate his Foley.  Will obtain urinalysis, urine culture.   MEDICATIONS GIVEN IN ED: Medications  alum & mag hydroxide-simeth (MAALOX/MYLANTA) 200-200-20 MG/5ML suspension 30 mL (has no administration in time range)    And  lidocaine (XYLOCAINE) 2 % viscous mouth solution 15 mL (has no administration in time range)  cefTRIAXone (ROCEPHIN) 1 g in sodium chloride 0.9 % 100 mL IVPB (has no administration in time range)  ondansetron (ZOFRAN) injection 4 mg (has no administration in time range)  pantoprazole (PROTONIX) injection 40 mg (has no  administration in time range)     ED COURSE: No relief with irrigation of the Foley.  Nurse reports they were able to remove a large blood clot.  He may need further irrigation but at this time will we will go ahead and exchange his Foley catheter.   Foley catheter exchange and patient has put out about 450 mL.  Urine is dark in nature but no gross hematuria or clots at this time.  Urine does appear infected.  Culture pending.  We will give Rocephin.  States he is having "sour stomach" with heartburn and nausea.  We will give Protonix, GI cocktail and Zofran.  Repeat abdominal exam is benign.  Will fluid challenge.  Anticipate discharge home.  Patient states he is ready to go home.  Will discharge with cefdinir.   At this time, I do not feel there is any life-threatening condition present. I reviewed all nursing notes, vitals, pertinent previous records.  All lab and urine results, EKGs, imaging ordered have been independently reviewed and interpreted by myself.  I reviewed all available radiology reports from any imaging ordered this visit.  Based on my assessment, I feel the patient is safe to be discharged home without further emergent workup and can continue workup as an outpatient as needed. Discussed all findings, treatment plan as well as usual and customary return precautions with patient.  They verbalize understanding and are comfortable with this plan.  Outpatient follow-up has been provided as needed.  All questions have been answered.   CONSULTS: No urologic consult needed at this time as we were able to successfully exchange his Foley catheter with good urine output and patient is now feeling better.   OUTSIDE RECORDS REVIEWED: Reviewed patient's recent visit at [facility] on June 14.       FINAL CLINICAL IMPRESSION(S) / ED DIAGNOSES   Final diagnoses:  Obstructed Foley catheter, initial encounter ([place])  Acute UTI     Rx / DC Orders   ED Discharge Orders          Ordered  cefdinir (OMNICEF) 300 MG capsule  2 times daily        11/15/21 0148    ondansetron (ZOFRAN-ODT) 4 MG disintegrating tablet  Every 6 hours PRN        11/15/21 0209             Note:  This document was prepared using Dragon voice recognition software and may include unintentional dictation errors.   Carisha Kantor, Delice Bison, DO 11/15/21 [phone]

## 2021-11-15 NOTE — Discharge Instructions (Addendum)
Please follow-up with your urologist as scheduled.

## 2021-11-15 NOTE — ED Notes (Signed)
Called EMS for transport home 

## 2021-11-17 LAB — URINE CULTURE: Culture: >=100000 — AB

## 2021-11-20 ENCOUNTER — Inpatient Hospital Stay
Admission: EM | Admit: 2021-11-20 | Discharge: 2021-11-28 | DRG: 699 | Disposition: A | Discharge status: Home Health | Payer: Medicare Other | Attending: Internal Medicine | Admitting: Internal Medicine

## 2021-11-20 DIAGNOSIS — Z9049 Acquired absence of other specified parts of digestive tract: Secondary | ICD-10-CM

## 2021-11-20 DIAGNOSIS — Z1612 Extended spectrum beta lactamase (ESBL) resistance: Secondary | ICD-10-CM | POA: Diagnosis present

## 2021-11-20 DIAGNOSIS — N390 Urinary tract infection, site not specified: Secondary | ICD-10-CM | POA: Diagnosis present

## 2021-11-20 DIAGNOSIS — R7881 Bacteremia: Secondary | ICD-10-CM

## 2021-11-20 DIAGNOSIS — Z8619 Personal history of other infectious and parasitic diseases: Secondary | ICD-10-CM

## 2021-11-20 DIAGNOSIS — N138 Other obstructive and reflux uropathy: Secondary | ICD-10-CM | POA: Diagnosis present

## 2021-11-20 DIAGNOSIS — N401 Enlarged prostate with lower urinary tract symptoms: Secondary | ICD-10-CM

## 2021-11-20 DIAGNOSIS — T83511A Infection and inflammatory reaction due to indwelling urethral catheter, initial encounter: Secondary | ICD-10-CM | POA: Diagnosis not present

## 2021-11-20 DIAGNOSIS — R338 Other retention of urine: Secondary | ICD-10-CM | POA: Diagnosis not present

## 2021-11-20 DIAGNOSIS — R54 Age-related physical debility: Secondary | ICD-10-CM | POA: Diagnosis present

## 2021-11-20 DIAGNOSIS — B965 Pseudomonas (aeruginosa) (mallei) (pseudomallei) as the cause of diseases classified elsewhere: Secondary | ICD-10-CM | POA: Diagnosis present

## 2021-11-20 DIAGNOSIS — I119 Hypertensive heart disease without heart failure: Secondary | ICD-10-CM | POA: Diagnosis present

## 2021-11-20 DIAGNOSIS — R319 Hematuria, unspecified: Principal | ICD-10-CM | POA: Diagnosis present

## 2021-11-20 DIAGNOSIS — Z9841 Cataract extraction status, right eye: Secondary | ICD-10-CM

## 2021-11-20 DIAGNOSIS — Z8673 Personal history of transient ischemic attack (TIA), and cerebral infarction without residual deficits: Secondary | ICD-10-CM

## 2021-11-20 DIAGNOSIS — R310 Gross hematuria: Secondary | ICD-10-CM | POA: Diagnosis present

## 2021-11-20 DIAGNOSIS — Z20822 Contact with and (suspected) exposure to covid-19: Secondary | ICD-10-CM | POA: Diagnosis present

## 2021-11-20 DIAGNOSIS — Z981 Arthrodesis status: Secondary | ICD-10-CM

## 2021-11-20 DIAGNOSIS — N319 Neuromuscular dysfunction of bladder, unspecified: Secondary | ICD-10-CM | POA: Diagnosis present

## 2021-11-20 DIAGNOSIS — C649 Malignant neoplasm of unspecified kidney, except renal pelvis: Secondary | ICD-10-CM | POA: Diagnosis present

## 2021-11-20 DIAGNOSIS — D649 Anemia, unspecified: Secondary | ICD-10-CM | POA: Diagnosis present

## 2021-11-20 DIAGNOSIS — Z7982 Long term (current) use of aspirin: Secondary | ICD-10-CM

## 2021-11-20 DIAGNOSIS — Z905 Acquired absence of kidney: Secondary | ICD-10-CM

## 2021-11-20 DIAGNOSIS — F0390 Unspecified dementia without behavioral disturbance: Secondary | ICD-10-CM | POA: Diagnosis present

## 2021-11-20 DIAGNOSIS — B9620 Unspecified Escherichia coli [E. coli] as the cause of diseases classified elsewhere: Secondary | ICD-10-CM | POA: Diagnosis present

## 2021-11-20 DIAGNOSIS — N4889 Other specified disorders of penis: Secondary | ICD-10-CM | POA: Diagnosis present

## 2021-11-20 DIAGNOSIS — Z79899 Other long term (current) drug therapy: Secondary | ICD-10-CM

## 2021-11-20 DIAGNOSIS — Z885 Allergy status to narcotic agent status: Secondary | ICD-10-CM

## 2021-11-20 DIAGNOSIS — Z85528 Personal history of other malignant neoplasm of kidney: Secondary | ICD-10-CM

## 2021-11-20 DIAGNOSIS — Z87891 Personal history of nicotine dependence: Secondary | ICD-10-CM

## 2021-11-20 DIAGNOSIS — Z9842 Cataract extraction status, left eye: Secondary | ICD-10-CM

## 2021-11-20 DIAGNOSIS — Z87440 Personal history of urinary (tract) infections: Secondary | ICD-10-CM

## 2021-11-20 DIAGNOSIS — I10 Essential (primary) hypertension: Secondary | ICD-10-CM | POA: Diagnosis present

## 2021-11-20 DIAGNOSIS — Z961 Presence of intraocular lens: Secondary | ICD-10-CM | POA: Diagnosis present

## 2021-11-20 DIAGNOSIS — Y846 Urinary catheterization as the cause of abnormal reaction of the patient, or of later complication, without mention of misadventure at the time of the procedure: Secondary | ICD-10-CM | POA: Diagnosis present

## 2021-11-20 MED ORDER — SODIUM CHLORIDE 0.9 % IR SOLN
3000.0000 mL | Status: DC
  Administered 2021-11-20 – 2021-11-23 (×2): 3000 mL

## 2021-11-20 MED ORDER — ACETAMINOPHEN 325 MG PO TABS
650.0000 mg | ORAL_TABLET | Freq: Once | ORAL | Status: AC
  Administered 2021-11-20: 650 mg via ORAL
  Filled 2021-11-20: qty 2

## 2021-11-20 MED ORDER — LIDOCAINE HCL URETHRAL/MUCOSAL 2 % EX GEL
1.0000 | Freq: Once | CUTANEOUS | Status: AC
  Administered 2021-11-20: 1 via URETHRAL
  Filled 2021-11-20: qty 10

## 2021-11-21 ENCOUNTER — Inpatient Hospital Stay: Payer: Medicare Other

## 2021-11-21 ENCOUNTER — Encounter: Payer: Self-pay | Admitting: Internal Medicine

## 2021-11-21 DIAGNOSIS — R310 Gross hematuria: Secondary | ICD-10-CM | POA: Diagnosis present

## 2021-11-21 DIAGNOSIS — Z9842 Cataract extraction status, left eye: Secondary | ICD-10-CM | POA: Diagnosis not present

## 2021-11-21 DIAGNOSIS — Z20822 Contact with and (suspected) exposure to covid-19: Secondary | ICD-10-CM | POA: Diagnosis present

## 2021-11-21 DIAGNOSIS — Z1612 Extended spectrum beta lactamase (ESBL) resistance: Secondary | ICD-10-CM | POA: Diagnosis present

## 2021-11-21 DIAGNOSIS — R319 Hematuria, unspecified: Secondary | ICD-10-CM | POA: Diagnosis present

## 2021-11-21 DIAGNOSIS — I119 Hypertensive heart disease without heart failure: Secondary | ICD-10-CM | POA: Diagnosis present

## 2021-11-21 DIAGNOSIS — A498 Other bacterial infections of unspecified site: Secondary | ICD-10-CM | POA: Diagnosis not present

## 2021-11-21 DIAGNOSIS — N138 Other obstructive and reflux uropathy: Secondary | ICD-10-CM | POA: Diagnosis present

## 2021-11-21 DIAGNOSIS — R339 Retention of urine, unspecified: Secondary | ICD-10-CM

## 2021-11-21 DIAGNOSIS — N390 Urinary tract infection, site not specified: Secondary | ICD-10-CM | POA: Diagnosis present

## 2021-11-21 DIAGNOSIS — Y846 Urinary catheterization as the cause of abnormal reaction of the patient, or of later complication, without mention of misadventure at the time of the procedure: Secondary | ICD-10-CM | POA: Diagnosis present

## 2021-11-21 DIAGNOSIS — R7881 Bacteremia: Secondary | ICD-10-CM | POA: Diagnosis present

## 2021-11-21 DIAGNOSIS — Z905 Acquired absence of kidney: Secondary | ICD-10-CM | POA: Diagnosis not present

## 2021-11-21 DIAGNOSIS — D649 Anemia, unspecified: Secondary | ICD-10-CM | POA: Diagnosis present

## 2021-11-21 DIAGNOSIS — R338 Other retention of urine: Secondary | ICD-10-CM | POA: Diagnosis present

## 2021-11-21 DIAGNOSIS — N4889 Other specified disorders of penis: Secondary | ICD-10-CM | POA: Diagnosis present

## 2021-11-21 DIAGNOSIS — B9620 Unspecified Escherichia coli [E. coli] as the cause of diseases classified elsewhere: Secondary | ICD-10-CM | POA: Diagnosis present

## 2021-11-21 DIAGNOSIS — Z85528 Personal history of other malignant neoplasm of kidney: Secondary | ICD-10-CM | POA: Diagnosis not present

## 2021-11-21 DIAGNOSIS — B965 Pseudomonas (aeruginosa) (mallei) (pseudomallei) as the cause of diseases classified elsewhere: Secondary | ICD-10-CM | POA: Diagnosis present

## 2021-11-21 DIAGNOSIS — N319 Neuromuscular dysfunction of bladder, unspecified: Secondary | ICD-10-CM | POA: Diagnosis present

## 2021-11-21 DIAGNOSIS — Z8673 Personal history of transient ischemic attack (TIA), and cerebral infarction without residual deficits: Secondary | ICD-10-CM | POA: Diagnosis not present

## 2021-11-21 DIAGNOSIS — Z981 Arthrodesis status: Secondary | ICD-10-CM | POA: Diagnosis not present

## 2021-11-21 DIAGNOSIS — F0390 Unspecified dementia without behavioral disturbance: Secondary | ICD-10-CM | POA: Diagnosis present

## 2021-11-21 DIAGNOSIS — N401 Enlarged prostate with lower urinary tract symptoms: Secondary | ICD-10-CM

## 2021-11-21 DIAGNOSIS — Z8619 Personal history of other infectious and parasitic diseases: Secondary | ICD-10-CM

## 2021-11-21 DIAGNOSIS — T83511A Infection and inflammatory reaction due to indwelling urethral catheter, initial encounter: Secondary | ICD-10-CM | POA: Diagnosis present

## 2021-11-21 DIAGNOSIS — Z9841 Cataract extraction status, right eye: Secondary | ICD-10-CM | POA: Diagnosis not present

## 2021-11-21 DIAGNOSIS — Z9049 Acquired absence of other specified parts of digestive tract: Secondary | ICD-10-CM | POA: Diagnosis not present

## 2021-11-21 DIAGNOSIS — Z961 Presence of intraocular lens: Secondary | ICD-10-CM | POA: Diagnosis present

## 2021-11-21 LAB — CBC WITH DIFFERENTIAL/PLATELET
Abs Immature Granulocytes: 0.03 10*3/uL (ref 0.00–0.07)
Basophils Absolute: 0 10*3/uL (ref 0.0–0.1)
Basophils Relative: 0 %
Eosinophils Absolute: 0.1 10*3/uL (ref 0.0–0.5)
Eosinophils Relative: 2 %
HCT: 37.1 % — ABNORMAL LOW (ref 39.0–52.0)
Hemoglobin: 11.7 g/dL — ABNORMAL LOW (ref 13.0–17.0)
Immature Granulocytes: 0 %
Lymphocytes Relative: 26 %
Lymphs Abs: 1.9 10*3/uL (ref 0.7–4.0)
MCH: 28.1 pg (ref 26.0–34.0)
MCHC: 31.5 g/dL (ref 30.0–36.0)
MCV: 89.2 fL (ref 80.0–100.0)
Monocytes Absolute: 0.6 10*3/uL (ref 0.1–1.0)
Monocytes Relative: 8 %
Neutro Abs: 4.6 10*3/uL (ref 1.7–7.7)
Neutrophils Relative %: 64 %
Platelets: 180 10*3/uL (ref 150–400)
RBC: 4.16 MIL/uL — ABNORMAL LOW (ref 4.22–5.81)
RDW: 13.8 % (ref 11.5–15.5)
WBC: 7.3 10*3/uL (ref 4.0–10.5)
nRBC: 0 % (ref 0.0–0.2)

## 2021-11-21 LAB — COMPREHENSIVE METABOLIC PANEL
ALT: 13 U/L (ref 0–44)
AST: 16 U/L (ref 15–41)
Albumin: 3.3 g/dL — ABNORMAL LOW (ref 3.5–5.0)
Alkaline Phosphatase: 63 U/L (ref 38–126)
Anion gap: 4 — ABNORMAL LOW (ref 5–15)
BUN: 17 mg/dL (ref 8–23)
CO2: 21 mmol/L — ABNORMAL LOW (ref 22–32)
Calcium: 7.6 mg/dL — ABNORMAL LOW (ref 8.9–10.3)
Chloride: 112 mmol/L — ABNORMAL HIGH (ref 98–111)
Creatinine, Ser: 0.81 mg/dL (ref 0.61–1.24)
GFR, Estimated: >60 mL/min (ref >60)
Glucose, Bld: 79 mg/dL (ref 70–99)
Potassium: 3.6 mmol/L (ref 3.5–5.1)
Sodium: 137 mmol/L (ref 135–145)
Total Bilirubin: 0.5 mg/dL (ref 0.3–1.2)
Total Protein: 6.7 g/dL (ref 6.5–8.1)

## 2021-11-21 LAB — CBC
HCT: 36.9 % — ABNORMAL LOW (ref 39.0–52.0)
Hemoglobin: 11.6 g/dL — ABNORMAL LOW (ref 13.0–17.0)
MCH: 27.6 pg (ref 26.0–34.0)
MCHC: 31.4 g/dL (ref 30.0–36.0)
MCV: 87.9 fL (ref 80.0–100.0)
Platelets: 182 10*3/uL (ref 150–400)
RBC: 4.2 MIL/uL — ABNORMAL LOW (ref 4.22–5.81)
RDW: 13.9 % (ref 11.5–15.5)
WBC: 10 10*3/uL (ref 4.0–10.5)
nRBC: 0 % (ref 0.0–0.2)

## 2021-11-21 LAB — SARS CORONAVIRUS 2 BY RT PCR: SARS Coronavirus 2 by RT PCR: NEGATIVE

## 2021-11-21 MED ORDER — IOHEXOL 350 MG/ML SOLN
80.0000 mL | Freq: Once | INTRAVENOUS | Status: AC | PRN
  Administered 2021-11-21: 80 mL via INTRAVENOUS

## 2021-11-21 MED ORDER — ASPIRIN 81 MG PO TBEC
81.0000 mg | DELAYED_RELEASE_TABLET | Freq: Every day | ORAL | Status: DC
  Administered 2021-11-21: 81 mg via ORAL
  Filled 2021-11-21: qty 1

## 2021-11-21 MED ORDER — GABAPENTIN 300 MG PO CAPS
300.0000 mg | ORAL_CAPSULE | Freq: Two times a day (BID) | ORAL | Status: DC
  Administered 2021-11-21 – 2021-11-28 (×15): 300 mg via ORAL
  Filled 2021-11-21 (×15): qty 1

## 2021-11-21 MED ORDER — TRAZODONE HCL 50 MG PO TABS
50.0000 mg | ORAL_TABLET | Freq: Every day | ORAL | Status: DC
  Administered 2021-11-21 – 2021-11-27 (×7): 50 mg via ORAL
  Filled 2021-11-21 (×8): qty 1

## 2021-11-21 MED ORDER — TAMSULOSIN HCL 0.4 MG PO CAPS
0.4000 mg | ORAL_CAPSULE | Freq: Every day | ORAL | Status: DC
  Administered 2021-11-21 – 2021-11-28 (×8): 0.4 mg via ORAL
  Filled 2021-11-21 (×8): qty 1

## 2021-11-21 MED ORDER — ONDANSETRON HCL 4 MG PO TABS: 4.0000 mg | ORAL_TABLET | Freq: Four times a day (QID) | ORAL | Status: DC | PRN

## 2021-11-21 MED ORDER — MORPHINE SULFATE (PF) 2 MG/ML IV SOLN: 2.0000 mg | INTRAVENOUS | Status: DC | PRN

## 2021-11-21 MED ORDER — ONDANSETRON HCL 4 MG/2ML IJ SOLN
4.0000 mg | Freq: Four times a day (QID) | INTRAMUSCULAR | Status: DC | PRN
  Administered 2021-11-21: 4 mg via INTRAVENOUS
  Filled 2021-11-21: qty 2

## 2021-11-21 MED ORDER — SODIUM CHLORIDE 0.9 % IV SOLN
INTRAVENOUS | Status: AC
Start: 2021-11-21 — End: 2021-11-21

## 2021-11-21 MED ORDER — DONEPEZIL HCL 5 MG PO TABS
5.0000 mg | ORAL_TABLET | Freq: Every day | ORAL | Status: DC
  Administered 2021-11-21 – 2021-11-27 (×7): 5 mg via ORAL
  Filled 2021-11-21 (×8): qty 1

## 2021-11-21 MED ORDER — HYDROCODONE-ACETAMINOPHEN 5-325 MG PO TABS
1.0000 | ORAL_TABLET | ORAL | Status: DC | PRN
  Administered 2021-11-21 – 2021-11-28 (×11): 1 via ORAL
  Filled 2021-11-21 (×9): qty 1
  Filled 2021-11-21: qty 2
  Filled 2021-11-21: qty 1

## 2021-11-21 MED ORDER — SODIUM CHLORIDE 0.9 % IR SOLN
3000.0000 mL | Status: DC
  Administered 2021-11-21 – 2021-11-24 (×21): 3000 mL

## 2021-11-21 MED ORDER — LOSARTAN POTASSIUM 25 MG PO TABS
25.0000 mg | ORAL_TABLET | Freq: Every day | ORAL | Status: DC
  Administered 2021-11-21: 25 mg via ORAL
  Filled 2021-11-21: qty 1

## 2021-11-21 MED ORDER — IOHEXOL 9 MG/ML PO SOLN
500.0000 mL | ORAL | Status: AC
  Administered 2021-11-21: 500 mL via ORAL

## 2021-11-21 MED ORDER — ATORVASTATIN CALCIUM 20 MG PO TABS
40.0000 mg | ORAL_TABLET | Freq: Every day | ORAL | Status: DC
  Administered 2021-11-21 – 2021-11-28 (×8): 40 mg via ORAL
  Filled 2021-11-21 (×8): qty 2

## 2021-11-21 MED ORDER — LEVOFLOXACIN 750 MG PO TABS
750.0000 mg | ORAL_TABLET | Freq: Every day | ORAL | Status: DC
Start: 2021-11-21 — End: 2021-11-21
  Filled 2021-11-21: qty 1

## 2021-11-21 MED ORDER — ACETAMINOPHEN 650 MG RE SUPP: 650.0000 mg | Freq: Four times a day (QID) | RECTAL | Status: DC | PRN

## 2021-11-21 MED ORDER — ACETAMINOPHEN 325 MG PO TABS
650.0000 mg | ORAL_TABLET | Freq: Four times a day (QID) | ORAL | Status: DC | PRN
  Administered 2021-11-21: 650 mg via ORAL
  Filled 2021-11-21: qty 2

## 2021-11-21 MED ORDER — LEVOFLOXACIN IN D5W 750 MG/150ML IV SOLN
750.0000 mg | INTRAVENOUS | Status: DC
  Administered 2021-11-21: 750 mg via INTRAVENOUS
  Filled 2021-11-21: qty 150

## 2021-11-21 MED ORDER — ENOXAPARIN SODIUM 40 MG/0.4ML IJ SOSY
40.0000 mg | PREFILLED_SYRINGE | INTRAMUSCULAR | Status: DC
Start: 2021-11-21 — End: 2021-11-21
  Administered 2021-11-21: 40 mg via SUBCUTANEOUS
  Filled 2021-11-21: qty 0.4

## 2021-11-21 MED ORDER — MIRABEGRON ER 25 MG PO TB24
25.0000 mg | ORAL_TABLET | Freq: Every day | ORAL | Status: DC
  Administered 2021-11-21 – 2021-11-28 (×8): 25 mg via ORAL
  Filled 2021-11-21 (×8): qty 1

## 2021-11-21 MED ORDER — TIZANIDINE HCL 4 MG PO TABS
4.0000 mg | ORAL_TABLET | Freq: Four times a day (QID) | ORAL | Status: DC | PRN
  Administered 2021-11-24 – 2021-11-28 (×3): 4 mg via ORAL
  Filled 2021-11-21 (×5): qty 1

## 2021-11-21 MED ORDER — SENNOSIDES-DOCUSATE SODIUM 8.6-50 MG PO TABS
1.0000 | ORAL_TABLET | Freq: Two times a day (BID) | ORAL | Status: DC
  Administered 2021-11-21 – 2021-11-27 (×13): 1 via ORAL
  Filled 2021-11-21 (×16): qty 1

## 2021-11-21 MED ORDER — PANTOPRAZOLE SODIUM 40 MG PO TBEC
40.0000 mg | DELAYED_RELEASE_TABLET | Freq: Every day | ORAL | Status: DC
  Administered 2021-11-21 – 2021-11-28 (×8): 40 mg via ORAL
  Filled 2021-11-21 (×8): qty 1

## 2021-11-21 MED ORDER — ACETAMINOPHEN 500 MG PO TABS: 500.0000 mg | ORAL_TABLET | Freq: Four times a day (QID) | ORAL | Status: DC | PRN

## 2021-11-21 NOTE — Consult Note (Signed)
Pharmacy Antibiotic Note  Adam Rios. is a 81 y.o. male admitted on 11/20/2021 with UTI.  Pharmacy has been consulted for levofloxacin dosing. Ucx growing PSA. Pt was given cefdinir outpatient.   Plan: Levofloxacin 750 mg daily   Height: 6\' 1"  (185.4 cm) Weight: 96.6 kg (212 lb 15.4 oz) IBW/kg (Calculated) : 79.9  Temp (24hrs), Avg:98.4 F (36.9 C), Min:97.7 F (36.5 C), Max:100.5 F (38.1 C)  Recent Labs  Lab 11/20/21 2328 11/21/21 0507  WBC 7.3 10.0  CREATININE 0.81  --     Estimated Creatinine Clearance: 87.6 mL/min (by C-G formula based on SCr of 0.81 mg/dL).    Allergies  Allergen Reactions   Fentanyl Nausea Only   Oxycodone Nausea And Vomiting     Thank you for allowing pharmacy to be a part of this patient's care.  Ronnald Ramp, PharmD, BCPS 11/21/2021 3:50 PM

## 2021-11-21 NOTE — Assessment & Plan Note (Signed)
No acute disused suspected at this time

## 2021-11-21 NOTE — Assessment & Plan Note (Addendum)
Acute urinary retention secondary to BPH requiring indwelling Foley placement on 6/9 S/p diagnostic cystoscopy 6/9 for urinary retention managed with CIC and more recently indwelling Foley catheter.   Awaiting referral for HoLEP procedure by his urologist at [facility] Foley catheter exchange in the ED

## 2021-11-22 ENCOUNTER — Encounter: Payer: Self-pay | Admitting: Internal Medicine

## 2021-11-22 ENCOUNTER — Other Ambulatory Visit: Payer: Self-pay

## 2021-11-22 DIAGNOSIS — R310 Gross hematuria: Secondary | ICD-10-CM

## 2021-11-22 DIAGNOSIS — R339 Retention of urine, unspecified: Secondary | ICD-10-CM

## 2021-11-22 DIAGNOSIS — N401 Enlarged prostate with lower urinary tract symptoms: Secondary | ICD-10-CM

## 2021-11-22 DIAGNOSIS — R7881 Bacteremia: Secondary | ICD-10-CM

## 2021-11-22 LAB — BLOOD CULTURE ID PANEL (REFLEXED) - BCID2

## 2021-11-22 LAB — BASIC METABOLIC PANEL
Anion gap: 5 (ref 5–15)
BUN: 18 mg/dL (ref 8–23)
CO2: 23 mmol/L (ref 22–32)
Calcium: 8.6 mg/dL — ABNORMAL LOW (ref 8.9–10.3)
Chloride: 104 mmol/L (ref 98–111)
Creatinine, Ser: 1.07 mg/dL (ref 0.61–1.24)
GFR, Estimated: >60 mL/min (ref >60)
Glucose, Bld: 112 mg/dL — ABNORMAL HIGH (ref 70–99)
Potassium: 4.5 mmol/L (ref 3.5–5.1)
Sodium: 132 mmol/L — ABNORMAL LOW (ref 135–145)

## 2021-11-22 LAB — CBC
HCT: 35.8 % — ABNORMAL LOW (ref 39.0–52.0)
Hemoglobin: 11.5 g/dL — ABNORMAL LOW (ref 13.0–17.0)
MCH: 27.9 pg (ref 26.0–34.0)
MCHC: 32.1 g/dL (ref 30.0–36.0)
MCV: 86.9 fL (ref 80.0–100.0)
Platelets: 178 10*3/uL (ref 150–400)
RBC: 4.12 MIL/uL — ABNORMAL LOW (ref 4.22–5.81)
RDW: 13.9 % (ref 11.5–15.5)
WBC: 9.7 10*3/uL (ref 4.0–10.5)
nRBC: 0 % (ref 0.0–0.2)

## 2021-11-22 MED ORDER — SODIUM CHLORIDE 0.9 % IV SOLN
2.0000 g | Freq: Three times a day (TID) | INTRAVENOUS | Status: DC
  Filled 2021-11-22 (×2): qty 12.5

## 2021-11-22 MED ORDER — CHLORHEXIDINE GLUCONATE CLOTH 2 % EX PADS
6.0000 | MEDICATED_PAD | Freq: Every day | CUTANEOUS | Status: DC
  Administered 2021-11-22 – 2021-11-28 (×7): 6 via TOPICAL

## 2021-11-22 MED ORDER — SODIUM CHLORIDE 0.9 % IV SOLN
1.0000 g | Freq: Three times a day (TID) | INTRAVENOUS | Status: AC
  Administered 2021-11-22 – 2021-11-26 (×14): 1 g via INTRAVENOUS
  Filled 2021-11-22: qty 20
  Filled 2021-11-22: qty 1
  Filled 2021-11-22 (×3): qty 20
  Filled 2021-11-22: qty 1
  Filled 2021-11-22 (×2): qty 20
  Filled 2021-11-22 (×2): qty 1
  Filled 2021-11-22 (×4): qty 20

## 2021-11-22 NOTE — Consult Note (Signed)
Pharmacy Antibiotic Note  Adam Hesch. is a 81 y.o. male admitted on 11/20/2021 with UTI.  Pharmacy has been consulted for levofloxacin dosing. Ucx growing PSA. Pt was given cefdinir outpatient.   6/24: blood cultures: + GNR (presumed pseudomonas)  Plan: Discontinue Levofloxacin 750 mg daily  Initiate cefepime 2 gram Q8H  Height: 6\' 1"  (185.4 cm) Weight: 96.6 kg (212 lb 15.4 oz) IBW/kg (Calculated) : 79.9  Temp (24hrs), Avg:99.4 F (37.4 C), Min:98.3 F (36.8 C), Max:100.8 F (38.2 C)  Recent Labs  Lab 11/20/21 2328 11/21/21 0507 11/22/21 0623  WBC 7.3 10.0 9.7  CREATININE 0.81  --  1.07     Estimated Creatinine Clearance: 66.3 mL/min (by C-G formula based on SCr of 1.07 mg/dL).    Allergies  Allergen Reactions   Fentanyl Nausea Only   Oxycodone Nausea And Vomiting     Thank you for allowing pharmacy to be a part of this patient's care.  Sharen Hones, PharmD, BCPS Clinical Pharmacist   11/22/2021 11:20 AM

## 2021-11-22 NOTE — Plan of Care (Signed)
  Problem: Education: Goal: Knowledge of General Education information will improve Description: Including pain rating scale, medication(s)/side effects and non-pharmacologic comfort measures Outcome: Progressing   Problem: Health Behavior/Discharge Planning: Goal: Ability to manage health-related needs will improve Outcome: Progressing   Problem: Clinical Measurements: Goal: Ability to maintain clinical measurements within normal limits will improve Outcome: Progressing Goal: Diagnostic test results will improve Outcome: Progressing Goal: Respiratory complications will improve Outcome: Progressing Goal: Cardiovascular complication will be avoided Outcome: Progressing   Problem: Nutrition: Goal: Adequate nutrition will be maintained Outcome: Progressing   Problem: Coping: Goal: Level of anxiety will decrease Outcome: Progressing   Problem: Elimination: Goal: Will not experience complications related to urinary retention Outcome: Progressing   Problem: Pain Managment: Goal: General experience of comfort will improve Outcome: Progressing   Problem: Safety: Goal: Ability to remain free from injury will improve Outcome: Progressing   Problem: Skin Integrity: Goal: Risk for impaired skin integrity will decrease Outcome: Progressing   Problem: Clinical Measurements: Goal: Will remain free from infection Outcome: Not Progressing   Problem: Activity: Goal: Risk for activity intolerance will decrease Outcome: Not Progressing   Problem: Elimination: Goal: Will not experience complications related to bowel motility Outcome: Not Progressing

## 2021-11-22 NOTE — Progress Notes (Addendum)
PROGRESS NOTE    [name].  WUJ:[id number] DOB: 19-Jun-1940 DOA: 11/20/2021 PCP: Laqueta Due, MD  Outpatient Specialists: [facility]    Brief Narrative:   From admission h and p [name]. is a 81 y.o. 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  He was seen in the ED on 6/17 for UTI.  Urine culture on 6/17 grew Pseudomonas sensitive mostly to the IV antibiotics except for Cipro.  He was treated with a dose of Rocephin and discharged on Omnicef.  He currently denies fever, abdominal pain, nausea and vomiting ED course and data review: Vitals within normal limits Blood work: CBC and CMP mostly unremarkable   Patient had a Foley catheter exchange and CBI was initiated in the ED.  Patient had relief with catheter exchange.    Assessment & Plan:   Principal Problem:   Hematuria Active Problems:   BPH with obstruction/lower urinary tract symptoms   History of recurrent UTI and ESBL UTI    Renal cell carcinoma s/p nephrectomy, left   Essential hypertension   Neurogenic bladder   Renal cell cancer (HCC)   History of CVA (cerebrovascular accident)   Frailty   # Neurogenic bladder # Chronic indwelling Foley # Recurrent uti # Bacteremia Managed by [facility]. Awaiting HOLEP but it is not scheduled, has appt w/ [facility] in august. Here with obstruction and gross hematuria. With ongoing lower abdominal pain. Recently treated with cefdinir for pseudomonas uti (diagnosed 6/17). Now with fever beginning 6/23, again this morning. One of two blood cultures growing GNRs, presumably from pseudomonas uti. CT of abdomen/pelvis on 6/23 nothing  acute. - levo started 6/23. Blood culture growing esbl e coli. Discussed w/ pharmacy, will start meropenem pending sensitivities - urology managing gross hematuria. Currently CBI - cont home flomax, myrbetriq  # Debility Will need PT/OT consults once off CBI  # HTN Here bp low normal - holding home lasis, spiro, losartan - cont statin - hold home aspirin  # Dementia - home donepezil  # Chronic pain - home gabapentin   DVT prophylaxis: SCDs Code Status: full Family Communication: daughter updated telephonically 6/24  Level of care: Progressive Status is: Inpatient Remains inpatient appropriate because: severity of illness    Consultants:  urology  Procedures: Continuous bladder irrigation  Antimicrobials:  none    Subjective: Feeling run down  Objective: Vitals:   11/21/21 1959 11/22/21 0341 11/22/21 0407 11/22/21 0746  BP: 118/72 102/68 108/68 112/72  Pulse: (!) 103 85 86 88  Resp: 16 16 18 16   Temp: 99 F (37.2 C) 98.3 F (36.8 C) 98.5 F (36.9 C) (!) 100.8 F (38.2 C)  TempSrc:  Oral  Oral  SpO2: 94% 91% 91% 91%  Weight:      Height:        Intake/Output Summary (Last 24 hours) at 11/22/2021 1102 Last data filed at 11/22/2021 1058 Gross per 24 hour  Intake 24162.44 ml  Output 95621 ml  Net -5487.56 ml   Filed Weights   11/20/21 2118 11/21/21 0447  Weight: 96.6 kg 96.6 kg    Examination:  General exam: Appears calm and comfortable  Respiratory system: Clear to auscultation. Respiratory effort normal. Cardiovascular  system: S1 & S2 heard, RRR. No JVD, murmurs, rubs, gallops or clicks. No pedal edema. Gastrointestinal system: Abdomen Is tender lower quadrants Central nervous system: Alert and oriented. No focal neurological deficits. Extremities: pitting edema Skin: No rashes, lesions or ulcers Psychiatry: Judgement and insight appear normal. Mood & affect appropriate.     Data Reviewed: I have personally reviewed following labs and  imaging studies  CBC: Recent Labs  Lab 11/20/21 2328 11/21/21 0507 11/22/21 0623  WBC 7.3 10.0 9.7  NEUTROABS 4.6  --   --   HGB 11.7* 11.6* 11.5*  HCT 37.1* 36.9* 35.8*  MCV 89.2 87.9 86.9  PLT 180 182 178   Basic Metabolic Panel: Recent Labs  Lab 11/20/21 2328 11/22/21 0623  NA 137 132*  K 3.6 4.5  CL 112* 104  CO2 21* 23  GLUCOSE 79 112*  BUN 17 18  CREATININE 0.81 1.07  CALCIUM 7.6* 8.6*   GFR: Estimated Creatinine Clearance: 66.3 mL/min (by C-G formula based on SCr of 1.07 mg/dL). Liver Function Tests: Recent Labs  Lab 11/20/21 2328  AST 16  ALT 13  ALKPHOS 63  BILITOT 0.5  PROT 6.7  ALBUMIN 3.3*   No results for input(s): "LIPASE", "AMYLASE" in the last 168 hours. No results for input(s): "AMMONIA" in the last 168 hours. Coagulation Profile: No results for input(s): "INR", "PROTIME" in the last 168 hours. Cardiac Enzymes: No results for input(s): "CKTOTAL", "CKMB", "CKMBINDEX", "TROPONINI" in the last 168 hours. BNP (last 3 results) No results for input(s): "PROBNP" in the last 8760 hours. HbA1C: No results for input(s): "HGBA1C" in the last 72 hours. CBG: No results for input(s): "GLUCAP" in the last 168 hours. Lipid Profile: No results for input(s): "CHOL", "HDL", "LDLCALC", "TRIG", "CHOLHDL", "LDLDIRECT" in the last 72 hours. Thyroid Function Tests: No results for input(s): "TSH", "T4TOTAL", "FREET4", "T3FREE", "THYROIDAB" in the last 72 hours. Anemia Panel: No results for input(s): "VITAMINB12", "FOLATE", "FERRITIN", "TIBC", "IRON", "RETICCTPCT" in the last 72 hours. Urine analysis:    Component Value Date/Time   COLORURINE YELLOW (A) 11/15/2021 0114   APPEARANCEUR HAZY (A) 11/15/2021 0114   LABSPEC 1.015 11/15/2021 0114   PHURINE 5.0 11/15/2021 0114   GLUCOSEU NEGATIVE 11/15/2021 0114   HGBUR LARGE (A) 11/15/2021 0114   BILIRUBINUR NEGATIVE 11/15/2021 0114   KETONESUR NEGATIVE 11/15/2021 0114   PROTEINUR 30 (A) 11/15/2021 0114    NITRITE NEGATIVE 11/15/2021 0114   LEUKOCYTESUR LARGE (A) 11/15/2021 0114   Sepsis Labs: @LABRCNTIP (procalcitonin:4,lacticidven:4)  ) Recent Results (from the past 240 hour(s))  Urine Culture     Status: Abnormal   Collection Time: 11/15/21  1:14 AM   Specimen: Urine, Random  Result Value Ref Range Status   Specimen Description   Final    URINE, RANDOM Performed at [facility], [address] [place], [place] [address]    Special Requests   Final    NONE Performed at [facility], [address] [place], [place] [address]    Culture >=100,000 COLONIES/mL PSEUDOMONAS AERUGINOSA (A)  Final   Report Status 11/17/2021 FINAL  Final   Organism ID, Bacteria PSEUDOMONAS AERUGINOSA (A)  Final      Susceptibility   Pseudomonas aeruginosa - MIC*    CEFTAZIDIME 4 SENSITIVE Sensitive     CIPROFLOXACIN <=0.25 SENSITIVE Sensitive     GENTAMICIN <=1 SENSITIVE Sensitive     IMIPENEM 2 SENSITIVE Sensitive     PIP/TAZO 8 SENSITIVE Sensitive     CEFEPIME 2 SENSITIVE Sensitive     * >=  100,000 COLONIES/mL PSEUDOMONAS AERUGINOSA  SARS Coronavirus 2 by RT PCR (hospital order, performed in [facility] hospital lab) *cepheid single result test* Anterior Nasal Swab     Status: None   Collection Time: 11/21/21  4:40 PM   Specimen: Anterior Nasal Swab  Result Value Ref Range Status   SARS Coronavirus 2 by RT PCR NEGATIVE NEGATIVE Final    Comment: (NOTE) SARS-CoV-2 target nucleic acids are NOT DETECTED.  The SARS-CoV-2 RNA is generally detectable in upper and lower respiratory specimens during the acute phase of infection. The lowest concentration of SARS-CoV-2 viral copies this assay can detect is 250 copies / mL. A negative result does not preclude SARS-CoV-2 infection and should not be used as the sole basis for treatment or other patient management decisions.  A negative result may occur with improper specimen collection / handling, submission of specimen other than  nasopharyngeal swab, presence of viral mutation(s) within the areas targeted by this assay, and inadequate number of viral copies (<250 copies / mL). A negative result must be combined with clinical observations, patient history, and epidemiological information.  Fact Sheet for Patients:   RoadLapTop.co.za  Fact Sheet for Healthcare Providers: http://kim-miller.com/  This test is not yet approved or  cleared by the [place] FDA and has been authorized for detection and/or diagnosis of SARS-CoV-2 by FDA under an Emergency Use Authorization (EUA).  This EUA will remain in effect (meaning this test can be used) for the duration of the COVID-19 declaration under Section 564(b)(1) of the Act, 21 U.S.C. section 360bbb-3(b)(1), unless the authorization is terminated or revoked sooner.  Performed at [facility], [address] [place], [place] [address]   Culture, blood (Routine X 2) w Reflex to ID Panel     Status: None (Preliminary result)   Collection Time: 11/21/21  5:14 PM   Specimen: BLOOD  Result Value Ref Range Status   Specimen Description BLOOD RW  Final   Special Requests BOTTLES DRAWN AEROBIC AND ANAEROBIC BCAV  Final   Culture  Setup Time   Final    ANAEROBIC BOTTLE ONLY GRAM NEGATIVE RODS Organism ID to follow CRITICAL RESULT CALLED TO, READ BACK BY AND VERIFIED WITH: Performed at [facility], [address] [place], [place] [address]    Culture GRAM NEGATIVE RODS  Final   Report Status PENDING  Incomplete  Culture, blood (Routine X 2) w Reflex to ID Panel     Status: None (Preliminary result)   Collection Time: 11/21/21  5:23 PM   Specimen: BLOOD  Result Value Ref Range Status   Specimen Description BLOOD [facility]  Final   Special Requests BOTTLES DRAWN AEROBIC AND ANAEROBIC BCAV  Final   Culture   Final    NO GROWTH < 12 HOURS Performed at [facility], [address] [place],  [place] [address]    Report Status PENDING  Incomplete         Radiology Studies: DG Chest Port 1 View  Result Date: 11/21/2021 CLINICAL DATA:  Fever, altered mental status EXAM: PORTABLE CHEST 1 VIEW COMPARISON:  09/07/2021 FINDINGS: Cardiomegaly. Unchanged elevation of the right hemidiaphragm. Mild, diffuse interstitial pulmonary opacity. The visualized skeletal structures are unremarkable. IMPRESSION: 1. Cardiomegaly with mild, diffuse interstitial pulmonary opacity, consistent with edema and/or chronic interstitial change. No focal airspace opacity. 2. Unchanged elevation of the right hemidiaphragm. Electronically Signed   By: Jearld Lesch M.D.   On: 11/21/2021 16:16   CT ABDOMEN PELVIS W CONTRAST  Result Date: 11/21/2021 CLINICAL DATA:  Pain left lower quadrant of abdomen EXAM: CT ABDOMEN AND PELVIS WITH CONTRAST TECHNIQUE: Multidetector CT imaging of the abdomen and pelvis was performed using the standard protocol following bolus administration of intravenous contrast. RADIATION DOSE REDUCTION: This exam was performed according to the departmental dose-optimization program which includes automated exposure control, adjustment of the mA and/or kV according to patient size and/or use of iterative reconstruction technique. CONTRAST:  80mL OMNIPAQUE IOHEXOL 350 MG/ML SOLN COMPARISON:  09/07/2021 FINDINGS: Lower chest: Coronary artery calcifications are seen. Linear densities in the right lower lung fields may suggest minimal subsegmental atelectasis. Breathing motion limits evaluation of lower lung fields. Hepatobiliary: There is no dilation of bile ducts. There is 3.6 cm fluid density structure in the left lobe with no significant change. Gallbladder is unremarkable. Pancreas: No focal abnormality is seen. Spleen: Unremarkable. Adrenals/Urinary Tract: Adrenals are unremarkable. There is evidence of previous left nephrectomy. There is no hydronephrosis in the right kidney. There are calcifications in the  renal artery branches. No definite renal stones are seen. There is low-density structure in the upper pole of right kidney which is not optimally characterized. Urinary bladder is not distended. Foley catheter is seen in the bladder. Stomach/Bowel: Small hiatal hernia is seen. Small bowel loops are not dilated. Appendix is not seen. There is no significant wall thickening in the colon. There is no pericolic stranding. There is incomplete distention of sigmoid. Vascular/Lymphatic: Extensive atherosclerotic plaques and calcifications are seen in the aorta and its major branches. Reproductive: There is marked enlargement of prostate projecting into the base of the bladder. There are scattered coarse calcifications in the prostate. Other: There is no ascites or pneumoperitoneum. There is diastasis between the rectus muscles in the upper abdomen. Left inguinal hernia containing fat is seen. Musculoskeletal: Degenerative changes are noted in the lumbar spine with spinal stenosis and encroachment of neural foramina at multiple levels. There is minimal retrolisthesis at L2-L3 level. There is previous laminectomy at L3-L4 level. IMPRESSION: There is no evidence of intestinal obstruction or pneumoperitoneum. Status post left nephrectomy. There is no hydronephrosis in the right kidney. Coronary artery calcifications are seen. Cyst is seen in the liver. Small hiatal hernia. Markedly enlarged prostate. Lumbar spondylosis. Other findings as described in the body of the report. Electronically Signed   By: Ernie Avena M.D.   On: 11/21/2021 15:00        Scheduled Meds:  atorvastatin  40 mg Oral Daily   Chlorhexidine Gluconate Cloth  6 each Topical Daily   donepezil  5 mg Oral QHS   gabapentin  300 mg Oral BID   mirabegron ER  25 mg Oral Daily   pantoprazole  40 mg Oral Daily   senna-docusate  1 tablet Oral BID   tamsulosin  0.4 mg Oral Daily   traZODone  50 mg Oral QHS   Continuous Infusions:  sodium  chloride irrigation     sodium chloride irrigation       LOS: 1 day     Silvano Bilis, MD [facility]   If 7PM-7AM, please contact night-coverage [email or web address] Password [facility] 11/22/2021, 11:02 AM

## 2021-11-22 NOTE — Consult Note (Signed)
Pharmacy Antibiotic Note  Adam Rios. is a 81 y.o. male admitted on 11/20/2021 with UTI.  Pharmacy has been consulted for levofloxacin dosing. Ucx growing PSA. Pt was given cefdinir outpatient.   6/24 11:20 -- Preliminary read was GNR in blood (presumed pseudomonas) >> Levaquin changed to Cefepime.  6/24 11:50 (UPDATE) Blood culuture 1 of 4: anaerobic bottle: GNR; enterobacterales; E. coli; resistance gene detected: CTX-m   Plan: Since ESBL Ecoli recommend transition to merrem IV 1g q8h  (plan discussed with Dr. Ashok Pall)  Height: 6\' 1"  (185.4 cm) Weight: 96.6 kg (212 lb 15.4 oz) IBW/kg (Calculated) : 79.9  Temp (24hrs), Avg:99.2 F (37.3 C), Min:98.1 F (36.7 C), Max:100.8 F (38.2 C)  Recent Labs  Lab 11/20/21 2328 11/21/21 0507 11/22/21 0623  WBC 7.3 10.0 9.7  CREATININE 0.81  --  1.07     Estimated Creatinine Clearance: 66.3 mL/min (by C-G formula based on SCr of 1.07 mg/dL).    Allergies  Allergen Reactions   Fentanyl Nausea Only   Oxycodone Nausea And Vomiting     Thank you for allowing pharmacy to be a part of this patient's care.  Martyn Malay, PharmD, [facility] Clinical Pharmacist 11/22/2021 12:10 PM

## 2021-11-23 DIAGNOSIS — R310 Gross hematuria: Secondary | ICD-10-CM | POA: Diagnosis not present

## 2021-11-23 LAB — CBC
HCT: 35.3 % — ABNORMAL LOW (ref 39.0–52.0)
Hemoglobin: 11.3 g/dL — ABNORMAL LOW (ref 13.0–17.0)
MCH: 27.8 pg (ref 26.0–34.0)
MCHC: 32 g/dL (ref 30.0–36.0)
MCV: 86.7 fL (ref 80.0–100.0)
Platelets: 159 10*3/uL (ref 150–400)
RBC: 4.07 MIL/uL — ABNORMAL LOW (ref 4.22–5.81)
RDW: 14.2 % (ref 11.5–15.5)
WBC: 7 10*3/uL (ref 4.0–10.5)
nRBC: 0 % (ref 0.0–0.2)

## 2021-11-23 LAB — BASIC METABOLIC PANEL
Anion gap: 6 (ref 5–15)
BUN: 21 mg/dL (ref 8–23)
CO2: 22 mmol/L (ref 22–32)
Calcium: 8.4 mg/dL — ABNORMAL LOW (ref 8.9–10.3)
Chloride: 106 mmol/L (ref 98–111)
Creatinine, Ser: 1.1 mg/dL (ref 0.61–1.24)
GFR, Estimated: >60 mL/min (ref >60)
Glucose, Bld: 101 mg/dL — ABNORMAL HIGH (ref 70–99)
Potassium: 4.2 mmol/L (ref 3.5–5.1)
Sodium: 134 mmol/L — ABNORMAL LOW (ref 135–145)

## 2021-11-23 LAB — GLUCOSE, CAPILLARY: Glucose-Capillary: 117 mg/dL — ABNORMAL HIGH (ref 70–99)

## 2021-11-23 NOTE — Evaluation (Signed)
Occupational Therapy Evaluation Patient Details Name: Adam Rios. MRN: [id number] DOB: May 08, 1941 Today's Date: 11/23/2021   History of Present Illness 81 y.o. 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  He was seen in the ED on 6/17 for UTI.  Urine culture on 6/17 grew Pseudomonas sensitive mostly to the IV antibiotics except for Cipro.  He was treated with a dose of Rocephin and discharged on Omnicef.  He currently denies fever, abdominal pain, nausea and vomiting  ED course and data review: Vitals within normal limits  Blood work: CBC and CMP mostly unremarkable     Patient had a Foley catheter exchange and CBI was initiated in the ED.   Clinical Impression   Patient presenting with decreased Ind in self care, balance, functional mobility/transfers, endurance, and safety awareness. Patient reports being Mod I with use of rollator at baseline for short distance ambulation and toileting needs. Pt reports having an aide 1x/wk for bathing and dressing but unable to verbalize who assists him during the rest of the week. Pt reports only showering 1x/wk when aide is there. Pt lives with family members but they working outside of the home and pt is alone at times. Pt is oriented to self only during this session. No family present to confirm baseline.  RN arrives to room and reports urologist and MD specifically requesting her to clamp CBI for mobilization attempts. She did so prior to assessment. Patient currently functioning at max A for bed mobility and to stand from standard height bed. Pt takes minimal sides steps with mod - max A with RW. Pt does not follow commands and  attempts to reach forward to sink in an attempt to hold on. Very impulsive and unsafe this session and lacking awareness to deficits. Patient will benefit from acute OT to increase overall independence in the areas of ADLs, functional mobility, and safety awareness in order to safely discharge to next venue of care.      Recommendations for follow up therapy are one component of a multi-disciplinary discharge planning process, led by the attending physician.  Recommendations may be updated based on patient status, additional functional criteria and insurance authorization.   Follow Up Recommendations  Skilled nursing-short term rehab (<3 hours/day)    Assistance Recommended at Discharge Frequent or constant Supervision/Assistance  Patient can return home with the following A lot of help with bathing/dressing/bathroom;A lot of help with walking and/or transfers;Assistance with cooking/housework;Direct supervision/assist for medications management;Help with stairs or ramp for entrance;Assist for transportation    Functional Status Assessment  Patient has had a recent decline in their functional status and demonstrates the ability to make significant improvements in function in a reasonable and predictable amount of time.  Equipment Recommendations  Other (comment) (defer to next venue of care)    Recommendations for Other Services       Precautions / Restrictions Precautions Precautions: Fall Precaution Comments: CBI needs to be clamped by RN prior to mobilizing      Mobility Bed Mobility Overal bed mobility: Needs Assistance Bed Mobility: Supine to Sit, Sit to Supine     Supine to sit: Max assist Sit to supine: Max assist   General bed mobility comments: assist for  trunk support and B LEs    Transfers Overall transfer level: Needs assistance Equipment used: Rolling walker (2 wheels) Transfers: Sit to/from Stand Sit to Stand: Max assist           General transfer  comment: max lifting assistance to stand from standard bed height      Balance Overall balance assessment: Needs assistance Sitting-balance support: Feet supported, Bilateral upper extremity supported Sitting balance-Leahy Scale: Fair Sitting balance - Comments: R lateral lean in sitting   Standing balance support: Reliant on assistive device for balance, During functional activity, Bilateral upper extremity supported Standing balance-Leahy Scale: Poor                             ADL either performed or assessed with clinical judgement   ADL Overall ADL's : Needs assistance/impaired                     Lower Body Dressing: Total assistance;Sit to/from stand   Toilet Transfer: Maximal assistance                   Vision Patient Visual Report: No change from baseline       Perception     Praxis      Pertinent Vitals/Pain Pain Assessment Pain Assessment: No/denies pain     Hand Dominance Right   Extremity/Trunk Assessment Upper Extremity Assessment Upper Extremity Assessment: Generalized weakness   Lower Extremity Assessment Lower Extremity Assessment: Generalized weakness       Communication Communication Communication: No difficulties   Cognition Arousal/Alertness: Awake/alert Behavior During Therapy: Flat affect Overall Cognitive Status: No family/caregiver present to determine baseline cognitive functioning                                 General Comments: Pt is oriented to self this session. He reports location as being "[place]" and time as"August 20th"                Home Living Family/patient expects to be discharged to:: Private residence Living Arrangements: Children Available Help at Discharge: Available PRN/intermittently;Family Type of Home: House Home Access: Stairs to enter Secretary/administrator of Steps: 1 Entrance Stairs-Rails: None Home Layout: One level     Bathroom Shower/Tub: Firefighter: Educational psychologist (4 wheels)          Prior Functioning/Environment Prior Level of Function : Needs assist             Mobility Comments: Pt reports he ambulates short distances with RW without assistance. ADLs Comments: Pt reports toileting independently but has home health RN that comes 1x/wk to provide min A for bathing.        OT Problem List: Decreased strength;Decreased activity tolerance;Decreased safety awareness;Impaired balance (sitting and/or standing);Decreased knowledge of use of DME or AE;Decreased knowledge of precautions      OT Treatment/Interventions: Self-care/ADL training;Balance training;Therapeutic exercise;Therapeutic activities;Energy conservation;DME and/or AE instruction;Visual/perceptual remediation/compensation;Patient/family education    OT Goals(Current goals can be found in the care plan section) Acute Rehab OT Goals Patient Stated Goal: to get better and sleep OT Goal Formulation: With patient Time For Goal Achievement: 12/07/21 Potential to Achieve Goals: Good ADL Goals Pt Will Perform Grooming: with mod assist;standing Pt Will Perform Lower Body Dressing: with mod assist;sit to/from stand Pt Will Transfer to Toilet:  with mod assist;ambulating;bedside commode Pt Will Perform Toileting - Clothing Manipulation and hygiene: with mod assist;sit to/from stand  OT Frequency: Min 2X/week       AM-PAC OT "6 Clicks" Daily Activity     Outcome Measure Help from another person eating meals?: None Help from another person taking care of personal grooming?: A Little Help from another person toileting, which includes using toliet, bedpan, or urinal?: Total Help from another person bathing (including washing, rinsing, drying)?: A Lot Help from another person to put on and taking off regular upper body clothing?: A Lot Help from another person to put on and taking off regular lower body clothing?: Total 6 Click  Score: 13   End of Session Equipment Utilized During Treatment: Rolling walker (2 wheels) Nurse Communication: Mobility status;Other (comment) (RN reports urologist and MD requesting her to clamp CBI for mobilization attempts and she did so.)  Activity Tolerance: Patient limited by fatigue Patient left: in bed;with call bell/phone within reach;with bed alarm set  OT Visit Diagnosis: Unsteadiness on feet (R26.81);Muscle weakness (generalized) (M62.81)                Time: 0981-1914 OT Time Calculation (min): 22 min Charges:  OT General Charges $OT Visit: 1 Visit OT Evaluation $OT Eval Low Complexity: 1 Low OT Treatments $Therapeutic Activity: 8-22 mins  Jackquline Denmark, MS, OTR/L , CBIS ascom [phone]  11/23/21, 11:55 AM

## 2021-11-23 NOTE — Progress Notes (Signed)
  Subjective: After I irrigated yesterday, urine remained clear yellow overnight until he mobilized with PT today. He then had recurrent hematuria and blood around catheter. RN irrigated out clots.  Seen this afternoon with clear yellow urine on slow drip. Irrigates without clots or hematuria without difficulty.  Objective: Vital signs in last 24 hours: Temp:  [97.4 F (36.3 C)-99.8 F (37.7 C)] 97.4 F (36.3 C) (06/25 1220) Pulse Rate:  [80-119] 91 (06/25 1220) Resp:  [16-20] 20 (06/25 1220) BP: (103-125)/(66-83) 125/83 (06/25 1220) SpO2:  [91 %-96 %] 96 % (06/25 1220)  Intake/Output from previous day: 06/24 0701 - 06/25 0700 In: 16109 [IV Piggyback:300] Out: 23200 [Urine:23200] Intake/Output this shift: Total I/O In: 6360 [P.O.:360; Other:6000] Out: 7700 [Urine:7700]  Physical Exam:  General: Alert and oriented CV: RRR Lungs: Clear Abdomen: Soft, ND, NT Ext: NT, No erythema  Lab Results: Recent Labs    11/21/21 0507 11/22/21 0623 11/23/21 0529  HGB 11.6* 11.5* 11.3*  HCT 36.9* 35.8* 35.3*   BMET Recent Labs    11/22/21 0623 11/23/21 0529  NA 132* 134*  K 4.5 4.2  CL 104 106  CO2 23 22  GLUCOSE 112* 101*  BUN 18 21  CREATININE 1.07 1.10  CALCIUM 8.6* 8.4*     Studies/Results: DG Chest Port 1 View  Result Date: 11/21/2021 CLINICAL DATA:  Fever, altered mental status EXAM: PORTABLE CHEST 1 VIEW COMPARISON:  09/07/2021 FINDINGS: Cardiomegaly. Unchanged elevation of the right hemidiaphragm. Mild, diffuse interstitial pulmonary opacity. The visualized skeletal structures are unremarkable. IMPRESSION: 1. Cardiomegaly with mild, diffuse interstitial pulmonary opacity, consistent with edema and/or chronic interstitial change. No focal airspace opacity. 2. Unchanged elevation of the right hemidiaphragm. Electronically Signed   By: Jearld Lesch M.D.   On: 11/21/2021 16:16    Assessment/Plan: BPH with urinary retention. Awaiting HoLEP with [facility] Recurrent MDR  UTIs Gross hematuria likely from large prostate  -I irrigated with and returned no clots. Irrigant is clear yellow. Irrigates without difficulty. CBI off presently. 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 I discussed with nursing to keep the foley hubbed.  -Titrate CBI as needed -Ucx 6/17 >100K pseudomonas. Blood cx NG. -Continue levofloxacin -He has f/u arranged at [facility] for HoLEP -Hopefully discharge home tomorrow as long as urine remains clear -Following   LOS: 2 days   Matt R. Tip Atienza MD 11/23/2021, 3:32 PM [facility]  Pager: [phone]

## 2021-11-23 NOTE — Evaluation (Signed)
Physical Therapy Evaluation Patient Details Name: Adam Rios. MRN: [id number] DOB: Oct 27, 1940 Today's Date: 11/23/2021  History of Present Illness  Pt is an 81 y/o M admitted on 11/20/21 after presenting to the ED with c/o recurrent hematuria with passage of clots through the Foley & urinary retention. Pt is being treated for recurrent UTI & had a folery catheter exchange, CBI was initiated in the ED. 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 Nurse reports CBI is clamped prior to PT arrival. Pt received in bed with blood around penile area & on gown, nurse reports she is aware. Pt requires max assist for supine>sit with reliance on hospital bed features & does transfer STS from elevated EOB with max assist with RW but upon initiating steps to recliner blood began to drip on floor with pt becoming anxious so pt returned to sitting. Pt noted to have several long blood clots on EOB & nurse called to room to assess. Pt left in care of nursing staff. Pt would benefit from STR upon d/c to maximize independence with functional mobility, decrease caregiver burden, & reduce fall risk prior to return home.    Recommendations for follow up therapy are one component of a multi-disciplinary discharge planning process, led by the attending physician.  Recommendations may be updated based on patient status, additional functional criteria and insurance authorization.  Follow Up Recommendations Skilled nursing-short term rehab (<3 hours/day) Can patient physically be transported by private vehicle: No    Assistance Recommended at Discharge Frequent or constant Supervision/Assistance  Patient can return home with the following  A lot of  help with walking and/or transfers;A lot of help with bathing/dressing/bathroom;Help with stairs or ramp for entrance;Assist for transportation;Assistance with cooking/housework    Equipment Recommendations None recommended by PT (TBD in next venue)  Recommendations for Other Services       Functional Status Assessment Patient has had a recent decline in their functional status and demonstrates the ability to make significant improvements in function in a reasonable and predictable amount of time.     Precautions / Restrictions Precautions Precautions: Fall Precaution Comments: CBI needs to be clamped by RN prior to mobilizing Restrictions Weight Bearing Restrictions: No      Mobility  Bed Mobility Overal bed mobility: Needs Assistance Bed Mobility: Supine to Sit     Supine to sit: Max assist, HOB elevated     General bed mobility comments: cuing for use of bed rails, HOB elevated, assistance to upright trunk & to move BLE to EOB    Transfers Overall transfer level: Needs assistance Equipment used: Rolling walker (2 wheels) Transfers: Sit to/from Stand Sit to Stand: Max assist           General transfer comment: Elevated EOB 2/2 pt using lift chair at home    Ambulation/Gait                  Stairs            Wheelchair Mobility    Modified Rankin (Stroke Patients Only)       Balance Overall balance assessment: Needs assistance Sitting-balance support: Feet supported, Bilateral upper extremity supported Sitting balance-Leahy Scale: Fair     Standing balance support: Reliant on assistive  device for balance, During functional activity, Bilateral upper extremity supported Standing balance-Leahy Scale: Poor                               Pertinent Vitals/Pain Pain Assessment Pain Assessment: No/denies pain    Home Living Family/patient expects to be discharged to:: Private residence Living Arrangements: Children (daughter,  son-in-law, & 37 y/o grandson) Available Help at Discharge: Available PRN/intermittently;Family Type of Home: House Home Access: Stairs to enter Entrance Stairs-Rails: None (pt usually holds to doorframe) Entrance Stairs-Number of Steps: 1   Home Layout: Two level;Able to live on main level with bedroom/bathroom Home Equipment: Educational psychologist (4 wheels) (lift chair)      Prior Function Prior Level of Function : Needs assist             Mobility Comments: Pt reports he sleeps in a lift chair but is able to ambulate to/from bathroom without assistance, uses doorframe to negotiate single step into house. ADLs Comments: Pt reports toileting independently but has home health RN that comes 1x/wk to provide min A for bathing.     Hand Dominance   Dominant Hand: Right    Extremity/Trunk Assessment   Upper Extremity Assessment Upper Extremity Assessment: Generalized weakness    Lower Extremity Assessment Lower Extremity Assessment: Generalized weakness       Communication   Communication: No difficulties  Cognition Arousal/Alertness: Awake/alert Behavior During Therapy: Flat affect Overall Cognitive Status: No family/caregiver present to determine baseline cognitive functioning                                 General Comments: Pt is oriented to self & year only, follow simple commands throughout session with extra time        General Comments      Exercises     Assessment/Plan    PT Assessment Patient needs continued PT services  PT Problem List Decreased strength;Decreased coordination;Decreased activity tolerance;Decreased cognition;Decreased knowledge of use of DME;Decreased balance;Decreased safety awareness;Decreased mobility       PT Treatment Interventions Therapeutic exercise;Gait training;Balance training;DME instruction;Stair training;Neuromuscular re-education;Modalities;Manual techniques;Functional mobility training;Cognitive  remediation;Therapeutic activities;Patient/family education    PT Goals (Current goals can be found in the Care Plan section)  Acute Rehab PT Goals Patient Stated Goal: none stated PT Goal Formulation: With patient Time For Goal Achievement: 12/07/21 Potential to Achieve Goals: Fair    Frequency Min 2X/week     Co-evaluation               AM-PAC PT "6 Clicks" Mobility  Outcome Measure Help needed turning from your back to your side while in a flat bed without using bedrails?: Total Help needed moving from lying on your back to sitting on the side of a flat bed without using bedrails?: Total Help needed moving to and from a bed to a chair (including a wheelchair)?: Total Help needed standing up from a chair using your arms (e.g., wheelchair or bedside chair)?: Total Help needed to walk in hospital room?: Total Help needed climbing 3-5 steps with a railing? : Total 6 Click Score: 6    End of Session   Activity Tolerance:  (limited by bleeding from catheter site) Patient left:  (sitting EOB in care of nurse) Nurse Communication: Mobility status PT Visit Diagnosis: Unsteadiness on feet (R26.81);Difficulty in walking, not elsewhere classified (R26.2);Muscle weakness (generalized) (M62.81)  Time: 1145-1200 PT Time Calculation (min) (ACUTE ONLY): 15 min   Charges:   PT Evaluation $PT Eval Moderate Complexity: 1 Mod          Aleda Grana, PT, DPT 11/23/21, 12:28 PM   Sandi Mariscal 11/23/2021, 12:26 PM

## 2021-11-23 NOTE — Progress Notes (Signed)
CBI clamped at 1057 per Dr. Ashok Pall and Dr. Cardell Peach request for trial and ability to work with PT/OT.  At 1200, while patient standing EOB with PT, patient was dripping bright red blood and stringy clots from around catheter tubing at urethral site. Foley tubing also noted to have dark red blood + clots in foley tubing. Irrigated foley with 100cc of NS and restarted CBI. Dr. Ashok Pall and Dr. Cardell Peach aware.

## 2021-11-24 DIAGNOSIS — R310 Gross hematuria: Secondary | ICD-10-CM | POA: Diagnosis not present

## 2021-11-24 LAB — CBC
HCT: 36.2 % — ABNORMAL LOW (ref 39.0–52.0)
Hemoglobin: 11.5 g/dL — ABNORMAL LOW (ref 13.0–17.0)
MCH: 27.9 pg (ref 26.0–34.0)
MCHC: 31.8 g/dL (ref 30.0–36.0)
MCV: 87.9 fL (ref 80.0–100.0)
Platelets: 183 10*3/uL (ref 150–400)
RBC: 4.12 MIL/uL — ABNORMAL LOW (ref 4.22–5.81)
RDW: 14.2 % (ref 11.5–15.5)
WBC: 6.7 10*3/uL (ref 4.0–10.5)
nRBC: 0 % (ref 0.0–0.2)

## 2021-11-24 LAB — BASIC METABOLIC PANEL
Anion gap: 7 (ref 5–15)
BUN: 29 mg/dL — ABNORMAL HIGH (ref 8–23)
CO2: 22 mmol/L (ref 22–32)
Calcium: 8.6 mg/dL — ABNORMAL LOW (ref 8.9–10.3)
Chloride: 107 mmol/L (ref 98–111)
Creatinine, Ser: 1.03 mg/dL (ref 0.61–1.24)
GFR, Estimated: >60 mL/min (ref >60)
Glucose, Bld: 113 mg/dL — ABNORMAL HIGH (ref 70–99)
Potassium: 4 mmol/L (ref 3.5–5.1)
Sodium: 136 mmol/L (ref 135–145)

## 2021-11-24 MED ORDER — SODIUM CHLORIDE 0.9 % IR SOLN: 3000.0000 mL | Status: DC

## 2021-11-24 NOTE — Progress Notes (Signed)
PROGRESS NOTE    Adam Rios.  ZOX:[id number] DOB: 27-May-1941 DOA: 11/20/2021 PCP: Laqueta Due, MD  Outpatient Specialists: [facility]    Brief Narrative:   From admission h and p Adam Rios. is a 81 y.o. 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  He was seen in the ED on 6/17 for UTI.  Urine culture on 6/17 grew Pseudomonas sensitive mostly to the IV antibiotics except for Cipro.  He was treated with a dose of Rocephin and discharged on Omnicef.  He currently denies fever, abdominal pain, nausea and vomiting ED course and data review: Vitals within normal limits Blood work: CBC and CMP mostly unremarkable   Patient had a Foley catheter exchange and CBI was initiated in the ED.  Patient had relief with catheter exchange.    Assessment & Plan:   Principal Problem:   Hematuria Active Problems:   BPH with obstruction/lower urinary tract symptoms   History of recurrent UTI and ESBL UTI    Renal cell carcinoma s/p nephrectomy, left   Essential hypertension   Neurogenic bladder   Renal cell cancer (HCC)   History of CVA (cerebrovascular accident)   Frailty   Bacteremia   # Neurogenic bladder # Chronic indwelling Foley # Recurrent uti # Bacteremia Managed outpt by [facility]. Awaiting HOLEP but it is not scheduled, has appt w/ [facility] in august. Here with obstruction and gross hematuria. With ongoing lower abdominal pain now resolved. Recently treated with cefdinir for pseudomonas uti (diagnosed 6/17). Now with fever beginning 6/23, last fever AM 6/24. One of two blood cultures growing esbl e coli. CT of abdomen/pelvis on 6/23  nothing acute. Today CBI clamped, urine draining clear - levofloxacin started 6/23. Merepenem started 6/24. Continue that pending sensitivities - urology managing gross hematuria. Now monitoring off CBI - cont home flomax, myrbetriq  # Debility Patient declines snf, will plan on HH  # HTN Here bp low normal - holding home lasix, spiro, losartan - cont statin - hold home aspirin  # Dementia - home donepezil  # Chronic pain - home gabapentin   DVT prophylaxis: SCDs Code Status: full Family Communication: daughter updated telephonically 6/26  Level of care: Progressive Status is: Inpatient Remains inpatient appropriate because: severity of illness, current need for IV abx    Consultants:  urology  Procedures: Continuous bladder irrigation  Antimicrobials:  none    Subjective: Feeling better today. Urine draining clear  Objective: Vitals:   11/23/21 2300 11/24/21 0347 11/24/21 0744 11/24/21 1158  BP: 127/69 129/81 107/70 116/72  Pulse: 94 71 70 74  Resp: 20 18 16 18   Temp: 98.6 F (37 C) 97.7 F (36.5 C) 97.6 F (36.4 C) 97.7 F (36.5 C)  TempSrc: Oral Oral Oral   SpO2: 93% 95% 94% 97%  Weight:      Height:        Intake/Output Summary (Last 24 hours) at 11/24/2021 1209 Last data filed at 11/24/2021 1108 Gross per 24 hour  Intake 81191 ml  Output 15600 ml  Net -5460 ml   Filed Weights   11/20/21 2118 11/21/21 0447  Weight: 96.6 kg 96.6 kg    Examination:  General exam: Appears calm  and comfortable  Respiratory system: Clear to auscultation. Respiratory effort normal. Cardiovascular system: S1 & S2 heard, RRR. No JVD, murmurs, rubs, gallops or clicks. No pedal edema. Gastrointestinal system: Abdomen Is nontender Central nervous system: Alert and oriented. No focal neurological deficits. Extremities: pitting edema Skin: No rashes, lesions or ulcers Psychiatry: Judgement and insight appear normal. Mood & affect appropriate.     Data  Reviewed: I have personally reviewed following labs and imaging studies  CBC: Recent Labs  Lab 11/20/21 2328 11/21/21 0507 11/22/21 0623 11/23/21 0529 11/24/21 0537  WBC 7.3 10.0 9.7 7.0 6.7  NEUTROABS 4.6  --   --   --   --   HGB 11.7* 11.6* 11.5* 11.3* 11.5*  HCT 37.1* 36.9* 35.8* 35.3* 36.2*  MCV 89.2 87.9 86.9 86.7 87.9  PLT 180 182 178 159 183   Basic Metabolic Panel: Recent Labs  Lab 11/20/21 2328 11/22/21 0623 11/23/21 0529 11/24/21 0537  NA 137 132* 134* 136  K 3.6 4.5 4.2 4.0  CL 112* 104 106 107  CO2 21* 23 22 22   GLUCOSE 79 112* 101* 113*  BUN 17 18 21  29*  CREATININE 0.81 1.07 1.10 1.03  CALCIUM 7.6* 8.6* 8.4* 8.6*   GFR: Estimated Creatinine Clearance: 68.9 mL/min (by C-G formula based on SCr of 1.03 mg/dL). Liver Function Tests: Recent Labs  Lab 11/20/21 2328  AST 16  ALT 13  ALKPHOS 63  BILITOT 0.5  PROT 6.7  ALBUMIN 3.3*   No results for input(s): "LIPASE", "AMYLASE" in the last 168 hours. No results for input(s): "AMMONIA" in the last 168 hours. Coagulation Profile: No results for input(s): "INR", "PROTIME" in the last 168 hours. Cardiac Enzymes: No results for input(s): "CKTOTAL", "CKMB", "CKMBINDEX", "TROPONINI" in the last 168 hours. BNP (last 3 results) No results for input(s): "PROBNP" in the last 8760 hours. HbA1C: No results for input(s): "HGBA1C" in the last 72 hours. CBG: Recent Labs  Lab 11/23/21 1643  GLUCAP 117*   Lipid Profile: No results for input(s): "CHOL", "HDL", "LDLCALC", "TRIG", "CHOLHDL", "LDLDIRECT" in the last 72 hours. Thyroid Function Tests: No results for input(s): "TSH", "T4TOTAL", "FREET4", "T3FREE", "THYROIDAB" in the last 72 hours. Anemia Panel: No results for input(s): "VITAMINB12", "FOLATE", "FERRITIN", "TIBC", "IRON", "RETICCTPCT" in the last 72 hours. Urine analysis:    Component Value Date/Time   COLORURINE YELLOW (A) 11/15/2021 0114   APPEARANCEUR HAZY (A) 11/15/2021 0114   LABSPEC 1.015  11/15/2021 0114   PHURINE 5.0 11/15/2021 0114   GLUCOSEU NEGATIVE 11/15/2021 0114   HGBUR LARGE (A) 11/15/2021 0114   BILIRUBINUR NEGATIVE 11/15/2021 0114   KETONESUR NEGATIVE 11/15/2021 0114   PROTEINUR 30 (A) 11/15/2021 0114   NITRITE NEGATIVE 11/15/2021 0114   LEUKOCYTESUR LARGE (A) 11/15/2021 0114   Sepsis Labs: @LABRCNTIP (procalcitonin:4,lacticidven:4)  ) Recent Results (from the past 240 hour(s))  Urine Culture     Status: Abnormal   Collection Time: 11/15/21  1:14 AM   Specimen: Urine, Random  Result Value Ref Range Status   Specimen Description   Final    URINE, RANDOM Performed at [facility], [address] [place], [place] [address]    Special Requests   Final    NONE Performed at [facility], [address] [place], [place] [address]    Culture >=100,000 COLONIES/mL PSEUDOMONAS AERUGINOSA (A)  Final   Report Status 11/17/2021 FINAL  Final   Organism ID, Bacteria PSEUDOMONAS AERUGINOSA (A)  Final      Susceptibility   Pseudomonas  aeruginosa - MIC*    CEFTAZIDIME 4 SENSITIVE Sensitive     CIPROFLOXACIN <=0.25 SENSITIVE Sensitive     GENTAMICIN <=1 SENSITIVE Sensitive     IMIPENEM 2 SENSITIVE Sensitive     PIP/TAZO 8 SENSITIVE Sensitive     CEFEPIME 2 SENSITIVE Sensitive     * >=100,000 COLONIES/mL PSEUDOMONAS AERUGINOSA  SARS Coronavirus 2 by RT PCR (hospital order, performed in [facility] hospital lab) *cepheid single result test* Anterior Nasal Swab     Status: None   Collection Time: 11/21/21  4:40 PM   Specimen: Anterior Nasal Swab  Result Value Ref Range Status   SARS Coronavirus 2 by RT PCR NEGATIVE NEGATIVE Final    Comment: (NOTE) SARS-CoV-2 target nucleic acids are NOT DETECTED.  The SARS-CoV-2 RNA is generally detectable in upper and lower respiratory specimens during the acute phase of infection. The lowest concentration of SARS-CoV-2 viral copies this assay can detect is 250 copies / mL. A negative result does not  preclude SARS-CoV-2 infection and should not be used as the sole basis for treatment or other patient management decisions.  A negative result may occur with improper specimen collection / handling, submission of specimen other than nasopharyngeal swab, presence of viral mutation(s) within the areas targeted by this assay, and inadequate number of viral copies (<250 copies / mL). A negative result must be combined with clinical observations, patient history, and epidemiological information.  Fact Sheet for Patients:   RoadLapTop.co.za  Fact Sheet for Healthcare Providers: http://kim-miller.com/  This test is not yet approved or  cleared by the [place] FDA and has been authorized for detection and/or diagnosis of SARS-CoV-2 by FDA under an Emergency Use Authorization (EUA).  This EUA will remain in effect (meaning this test can be used) for the duration of the COVID-19 declaration under Section 564(b)(1) of the Act, 21 U.S.C. section 360bbb-3(b)(1), unless the authorization is terminated or revoked sooner.  Performed at [facility], [address] [place], [place] [address]   Culture, blood (Routine X 2) w Reflex to ID Panel     Status: Abnormal (Preliminary result)   Collection Time: 11/21/21  5:14 PM   Specimen: BLOOD  Result Value Ref Range Status   Specimen Description   Final    BLOOD RW Performed at [facility], [address] [place], [place] [address]    Special Requests   Final    BOTTLES DRAWN AEROBIC AND ANAEROBIC BCAV Performed at [facility], [address] [place], [place] [address]    Culture  Setup Time   Final    ANAEROBIC BOTTLE ONLY GRAM NEGATIVE RODS CRITICAL RESULT CALLED TO, READ BACK BY AND VERIFIED WITH: DEVAN MITCHELL @1151  11/22/21 MJU    Culture (A)  Final    ESCHERICHIA COLI SUSCEPTIBILITIES TO FOLLOW Performed at [facility], [address],  [place], [place] [address]    Report Status PENDING  Incomplete  Blood Culture ID Panel (Reflexed)     Status: Abnormal   Collection Time: 11/21/21  5:14 PM  Result Value Ref Range Status   Enterococcus faecalis NOT DETECTED NOT DETECTED Final   Enterococcus Faecium NOT DETECTED NOT DETECTED Final   Listeria monocytogenes NOT DETECTED NOT DETECTED Final   Staphylococcus species NOT DETECTED NOT DETECTED Final   Staphylococcus aureus (BCID) NOT DETECTED NOT DETECTED Final   Staphylococcus epidermidis NOT DETECTED NOT DETECTED Final   Staphylococcus lugdunensis NOT DETECTED NOT DETECTED Final   Streptococcus species NOT  DETECTED NOT DETECTED Final   Streptococcus agalactiae NOT DETECTED NOT DETECTED Final   Streptococcus pneumoniae NOT DETECTED NOT DETECTED Final   Streptococcus pyogenes NOT DETECTED NOT DETECTED Final   A.calcoaceticus-baumannii NOT DETECTED NOT DETECTED Final   Bacteroides fragilis NOT DETECTED NOT DETECTED Final   Enterobacterales DETECTED (A) NOT DETECTED Final    Comment: Enterobacterales represent a large order of gram negative bacteria, not a single organism. CRITICAL RESULT CALLED TO, READ BACK BY AND VERIFIED WITH: DEVAN MITCHELL @1151  11/22/21 MJU    Enterobacter cloacae complex NOT DETECTED NOT DETECTED Final   Escherichia coli DETECTED (A) NOT DETECTED Final    Comment: CRITICAL RESULT CALLED TO, READ BACK BY AND VERIFIED WITH: DEVAN MITCHELL @1151  11/22/21 MJU    Klebsiella aerogenes NOT DETECTED NOT DETECTED Final   Klebsiella oxytoca NOT DETECTED NOT DETECTED Final   Klebsiella pneumoniae NOT DETECTED NOT DETECTED Final   Proteus species NOT DETECTED NOT DETECTED Final   Salmonella species NOT DETECTED NOT DETECTED Final   Serratia marcescens NOT DETECTED NOT DETECTED Final   Haemophilus influenzae NOT DETECTED NOT DETECTED Final   Neisseria meningitidis NOT DETECTED NOT DETECTED Final   Pseudomonas aeruginosa NOT DETECTED NOT DETECTED Final    Stenotrophomonas maltophilia NOT DETECTED NOT DETECTED Final   Candida albicans NOT DETECTED NOT DETECTED Final   Candida auris NOT DETECTED NOT DETECTED Final   Candida glabrata NOT DETECTED NOT DETECTED Final   Candida krusei NOT DETECTED NOT DETECTED Final   Candida parapsilosis NOT DETECTED NOT DETECTED Final   Candida tropicalis NOT DETECTED NOT DETECTED Final   Cryptococcus neoformans/gattii NOT DETECTED NOT DETECTED Final   CTX-M ESBL DETECTED (A) NOT DETECTED Final    Comment: CRITICAL RESULT CALLED TO, READ BACK BY AND VERIFIED WITH: DEVAN MITCHELL @1151  11/22/21 MJU (NOTE) Extended spectrum beta-lactamase detected. Recommend a carbapenem as initial therapy.      Carbapenem resistance IMP NOT DETECTED NOT DETECTED Final   Carbapenem resistance KPC NOT DETECTED NOT DETECTED Final   Carbapenem resistance NDM NOT DETECTED NOT DETECTED Final   Carbapenem resist OXA 48 LIKE NOT DETECTED NOT DETECTED Final   Carbapenem resistance VIM NOT DETECTED NOT DETECTED Final    Comment: Performed at [facility], [address] [place], [place] [address]  Culture, blood (Routine X 2) w Reflex to ID Panel     Status: None (Preliminary result)   Collection Time: 11/21/21  5:23 PM   Specimen: BLOOD  Result Value Ref Range Status   Specimen Description BLOOD [facility]  Final   Special Requests BOTTLES DRAWN AEROBIC AND ANAEROBIC BCAV  Final   Culture   Final    NO GROWTH 3 DAYS Performed at [facility], [address] [place], [place] [address]    Report Status PENDING  Incomplete         Radiology Studies: No results found.      Scheduled Meds:  atorvastatin  40 mg Oral Daily   Chlorhexidine Gluconate Cloth  6 each Topical Daily   donepezil  5 mg Oral QHS   gabapentin  300 mg Oral BID   mirabegron ER  25 mg Oral Daily   pantoprazole  40 mg Oral Daily   senna-docusate  1 tablet Oral BID   tamsulosin  0.4 mg Oral Daily   traZODone  50 mg Oral QHS    Continuous Infusions:  meropenem (MERREM) IV Stopped (11/24/21 0656)   sodium chloride irrigation     sodium chloride irrigation  0 mL (11/23/21 2336)     LOS: 3 days     Silvano Bilis, MD [facility]   If 7PM-7AM, please contact night-coverage [email or web address] Password [facility] 11/24/2021, 12:09 PM

## 2021-11-24 NOTE — Progress Notes (Signed)
Urology Inpatient Progress Note  Subjective: No acute events overnight.  He is afebrile, VSS. Hemoglobin stable, 11.5. Foley catheter in place draining clear urine on slow drip CBI. He denies discomfort associated with his Foley catheter.  Anti-infectives: Anti-infectives (From admission, onward)    Start     Dose/Rate Route Frequency Ordered Stop   11/22/21 1400  ceFEPIme (MAXIPIME) 2 g in sodium chloride 0.9 % 100 mL IVPB  Status:  Discontinued        2 g 200 mL/hr over 30 Minutes Intravenous Every 8 hours 11/22/21 1119 11/22/21 1211   11/22/21 1400  meropenem (MERREM) 1 g in sodium chloride 0.9 % 100 mL IVPB        1 g 200 mL/hr over 30 Minutes Intravenous Every 8 hours 11/22/21 1211     11/21/21 1800  levofloxacin (LEVAQUIN) IVPB 750 mg  Status:  Discontinued        750 mg 100 mL/hr over 90 Minutes Intravenous Every 24 hours 11/21/21 1712 11/22/21 1053   11/21/21 1645  levofloxacin (LEVAQUIN) tablet 750 mg  Status:  Discontinued        750 mg Oral Daily 11/21/21 1550 11/21/21 1712       Current Facility-Administered Medications  Medication Dose Route Frequency Provider Last Rate Last Admin   acetaminophen (TYLENOL) tablet 650 mg  650 mg Oral Q6H PRN Andris Baumann, MD   650 mg at 11/21/21 1549   Or   acetaminophen (TYLENOL) suppository 650 mg  650 mg Rectal Q6H PRN Andris Baumann, MD       atorvastatin (LIPITOR) tablet 40 mg  40 mg Oral Daily Andris Baumann, MD   40 mg at 11/23/21 0850   Chlorhexidine Gluconate Cloth 2 % PADS 6 each  6 each Topical Daily Kathrynn Running, MD   6 each at 11/23/21 0851   donepezil (ARICEPT) tablet 5 mg  5 mg Oral QHS Lindajo Royal V, MD   5 mg at 11/23/21 2213   gabapentin (NEURONTIN) capsule 300 mg  300 mg Oral BID Kathrynn Running, MD   300 mg at 11/23/21 2213   HYDROcodone-acetaminophen (NORCO/VICODIN) 5-325 MG per tablet 1-2 tablet  1-2 tablet Oral Q4H PRN Andris Baumann, MD   1 tablet at 11/24/21 0344   meropenem (MERREM) 1 g in  sodium chloride 0.9 % 100 mL IVPB  1 g Intravenous Q8H Wouk, Wilfred Curtis, MD   Stopped at 11/24/21 0656   mirabegron ER (MYRBETRIQ) tablet 25 mg  25 mg Oral Daily Lindajo Royal V, MD   25 mg at 11/23/21 0850   morphine (PF) 2 MG/ML injection 2 mg  2 mg Intravenous Q2H PRN Andris Baumann, MD       ondansetron [facility]) tablet 4 mg  4 mg Oral Q6H PRN Andris Baumann, MD       Or   ondansetron [facility]) injection 4 mg  4 mg Intravenous Q6H PRN Andris Baumann, MD   4 mg at 11/21/21 1954   pantoprazole (PROTONIX) EC tablet 40 mg  40 mg Oral Daily Andris Baumann, MD   40 mg at 11/23/21 0850   senna-docusate (Senokot-S) tablet 1 tablet  1 tablet Oral BID Andris Baumann, MD   1 tablet at 11/23/21 2213   sodium chloride irrigation 0.9 % 3,000 mL  3,000 mL Irrigation Continuous Willy Eddy, MD   Rate Change at 11/24/21 0413   sodium chloride irrigation 0.9 % 3,000 mL  3,000 mL  Irrigation Continuous Lindajo Royal V, MD 0 mL/hr at 11/23/21 2336 3,000 mL at 11/24/21 0606   tamsulosin (FLOMAX) capsule 0.4 mg  0.4 mg Oral Daily Lindajo Royal V, MD   0.4 mg at 11/23/21 0850   tiZANidine (ZANAFLEX) tablet 4 mg  4 mg Oral Q6H PRN Andris Baumann, MD       traZODone (DESYREL) tablet 50 mg  50 mg Oral QHS Andris Baumann, MD   50 mg at 11/23/21 2213   Objective: Vital signs in last 24 hours: Temp:  [97.4 F (36.3 C)-98.6 F (37 C)] 97.6 F (36.4 C) (06/26 0744) Pulse Rate:  [70-94] 70 (06/26 0744) Resp:  [16-20] 16 (06/26 0744) BP: (107-129)/(66-84) 107/70 (06/26 0744) SpO2:  [93 %-98 %] 94 % (06/26 0744)  Intake/Output from previous day: 06/25 0701 - 06/26 0700 In: 86578 [P.O.:720; IV Piggyback:300] Out: 16300 [Urine:16300] Intake/Output this shift: Total I/O In: -  Out: 1900 [Urine:1900]  Physical Exam Vitals and nursing note reviewed.  Constitutional:      General: He is not in acute distress.    Appearance: He is not ill-appearing, toxic-appearing or diaphoretic.  HENT:     Head:  Normocephalic and atraumatic.  Pulmonary:     Effort: Pulmonary effort is normal. No respiratory distress.  Skin:    General: Skin is warm and dry.  Neurological:     Mental Status: He is alert. Mental status is at baseline.  Psychiatric:        Mood and Affect: Mood normal.        Behavior: Behavior normal.     Lab Results:  Recent Labs    11/23/21 0529 11/24/21 0537  WBC 7.0 6.7  HGB 11.3* 11.5*  HCT 35.3* 36.2*  PLT 159 183   BMET Recent Labs    11/23/21 0529 11/24/21 0537  NA 134* 136  K 4.2 4.0  CL 106 107  CO2 22 22  GLUCOSE 101* 113*  BUN 21 29*  CREATININE 1.10 1.03  CALCIUM 8.4* 8.6*   Assessment & Plan: 81 year old male with a complex urologic history with urinary retention awaiting HOLEP at [facility] managed with chronic indwelling Foley catheter and recurrent MDR UTIs recently treated for UTI now presenting with hematuria requiring CBI.  Hematuria has resolved as of this morning and his blood counts are stable.  I turned off his CBI.  If his urine remains light pink or lighter off CBI, okay to discontinue this today.  Foley catheter to remain in place.  Recommendations: -Continue antibiotics for total of 7 to 10 days of therapy -Continue to monitor urine efflux off CBI -Outpatient follow-up at [facility] for [facility], PA-C 11/24/2021

## 2021-11-25 DIAGNOSIS — N138 Other obstructive and reflux uropathy: Secondary | ICD-10-CM

## 2021-11-25 DIAGNOSIS — R310 Gross hematuria: Secondary | ICD-10-CM | POA: Diagnosis not present

## 2021-11-25 DIAGNOSIS — N401 Enlarged prostate with lower urinary tract symptoms: Secondary | ICD-10-CM

## 2021-11-25 LAB — CBC
HCT: 34.4 % — ABNORMAL LOW (ref 39.0–52.0)
Hemoglobin: 10.8 g/dL — ABNORMAL LOW (ref 13.0–17.0)
MCH: 27.8 pg (ref 26.0–34.0)
MCHC: 31.4 g/dL (ref 30.0–36.0)
MCV: 88.7 fL (ref 80.0–100.0)
Platelets: 187 10*3/uL (ref 150–400)
RBC: 3.88 MIL/uL — ABNORMAL LOW (ref 4.22–5.81)
RDW: 14.3 % (ref 11.5–15.5)
WBC: 7.3 10*3/uL (ref 4.0–10.5)
nRBC: 0 % (ref 0.0–0.2)

## 2021-11-25 LAB — CULTURE, BLOOD (ROUTINE X 2)

## 2021-11-25 LAB — BASIC METABOLIC PANEL
Anion gap: 6 (ref 5–15)
BUN: 26 mg/dL — ABNORMAL HIGH (ref 8–23)
CO2: 23 mmol/L (ref 22–32)
Calcium: 8.3 mg/dL — ABNORMAL LOW (ref 8.9–10.3)
Chloride: 109 mmol/L (ref 98–111)
Creatinine, Ser: 0.97 mg/dL (ref 0.61–1.24)
GFR, Estimated: >60 mL/min (ref >60)
Glucose, Bld: 102 mg/dL — ABNORMAL HIGH (ref 70–99)
Potassium: 3.9 mmol/L (ref 3.5–5.1)
Sodium: 138 mmol/L (ref 135–145)

## 2021-11-25 MED ORDER — OXYBUTYNIN CHLORIDE 5 MG PO TABS
5.0000 mg | ORAL_TABLET | Freq: Three times a day (TID) | ORAL | Status: DC | PRN
  Administered 2021-11-25: 5 mg via ORAL
  Filled 2021-11-25 (×2): qty 1

## 2021-11-25 NOTE — Consult Note (Signed)
NAME: Adam Rios.  DOB: [birth year]  MRN: [id number]  Date/Time: 11/25/2021 8:03 PM  REQUESTING PROVIDER: Dr Si Raider Subjective:  REASON FOR CONSULT: ESBL bacteremia ? Adam Rios. is a 81 y.o. 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  In the ED on 11/20/2021 BP 144/88, temperature 97.7, heart rate 66, sats 98% WBC 10, Hb 11.6, platelet 182, creatinine 1.07 Blood culture  was sent and he was started on antibiotics.  Was seen by urology ordered continuous bladder irrigation until the urine was clear of any blood or clot.  After blood cultures positive for ESBL E. coli I am seeing the patient   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[place])    Hypertension    Neurogenic bladder    Renal cell carcinoma ([place]) 2005   Renal disorder    Stroke ([place])     01/17/18, 12/21   Some weakness and cognitive decline    Past Surgical History:  Procedure Laterality Date   APPENDECTOMY     CATARACT EXTRACTION W/PHACO Right 07/22/2020   Procedure: CATARACT EXTRACTION PHACO AND INTRAOCULAR LENS PLACEMENT ([place]) RIGHT;  Surgeon: Eulogio Bear, MD;  Location: [facility];  Service: Ophthalmology;  Laterality: Right;  4.77 0:44.2   CATARACT EXTRACTION W/PHACO Left 08/05/2020   Procedure: CATARACT EXTRACTION PHACO AND INTRAOCULAR LENS PLACEMENT ([facility]) LEFT 10.73 01:13.8;  Surgeon: Eulogio Bear, MD;  Location: [facility];  Service: Ophthalmology;  Laterality: Left;  Use eye stretcher not chair   HERNIA REPAIR     LUMBAR FUSION     NEPHRECTOMY Left 2005    Social History    Socioeconomic History   Marital status: Divorced    Spouse name: Not on file   Number of children: Not on file   Years of education: Not on file   Highest education level: Not on file  Occupational History   Not on file  Tobacco Use   Smoking status: Former    Packs/day: 0.25    Years: 10.00    Total pack years: 2.50    Types: Cigarettes    Quit date: 2005    Years since quitting: 18.4   Smokeless tobacco: Never  Vaping Use   Vaping Use: Never used  Substance and Sexual Activity   Alcohol use: Not Currently   Drug use: Never   Sexual activity: Not on file  Other Topics Concern   Not on file  Social History Narrative   Not on file   Social Determinants of Health   Financial Resource Strain: Not on file  Food Insecurity: Not on file  Transportation Needs: Not on file  Physical Activity: Not on file  Stress: Not on file  Social Connections: Not on file  Intimate Partner Violence: Not on file    History reviewed. No pertinent family history. Allergies  Allergen Reactions   Fentanyl Nausea Only   Oxycodone Nausea And Vomiting   I? Current Facility-Administered Medications  Medication Dose Route Frequency Provider Last Rate Last Admin   acetaminophen (TYLENOL) tablet 650 mg  650 mg Oral Q6H PRN Athena Masse, MD  650 mg at 11/21/21 1549   Or   acetaminophen (TYLENOL) suppository 650 mg  650 mg Rectal Q6H PRN Athena Masse, MD       atorvastatin (LIPITOR) tablet 40 mg  40 mg Oral Daily Athena Masse, MD   40 mg at 11/25/21 1010   Chlorhexidine Gluconate Cloth 2 % PADS 6 each  6 each Topical Daily Gwynne Edinger, MD   6 each at 11/25/21 1012   donepezil (ARICEPT) tablet 5 mg  5 mg Oral QHS Judd Gaudier V, MD   5 mg at 11/24/21 2228   gabapentin (NEURONTIN) capsule 300 mg  300 mg Oral BID Gwynne Edinger, MD   300 mg at 11/25/21 1010   HYDROcodone-acetaminophen (NORCO/VICODIN) 5-325 MG per tablet 1-2 tablet  1-2 tablet Oral Q4H PRN Athena Masse, MD    1 tablet at 11/25/21 0404   meropenem (MERREM) 1 g in sodium chloride 0.9 % 100 mL IVPB  1 g Intravenous Q8H Wouk, Ailene Rud, MD   Stopped at 11/25/21 1447   mirabegron ER (MYRBETRIQ) tablet 25 mg  25 mg Oral Daily Judd Gaudier V, MD   25 mg at 11/25/21 1011   ondansetron (ZOFRAN) tablet 4 mg  4 mg Oral Q6H PRN Athena Masse, MD       Or   ondansetron [facility]) injection 4 mg  4 mg Intravenous Q6H PRN Athena Masse, MD   4 mg at 11/21/21 1954   oxybutynin (DITROPAN) tablet 5 mg  5 mg Oral TID PRN Debroah Loop, PA-C       pantoprazole (PROTONIX) EC tablet 40 mg  40 mg Oral Daily Judd Gaudier V, MD   40 mg at 11/25/21 1011   senna-docusate (Senokot-S) tablet 1 tablet  1 tablet Oral BID Athena Masse, MD   1 tablet at 11/25/21 1010   tamsulosin (FLOMAX) capsule 0.4 mg  0.4 mg Oral Daily Judd Gaudier V, MD   0.4 mg at 11/25/21 1011   tiZANidine (ZANAFLEX) tablet 4 mg  4 mg Oral Q6H PRN Athena Masse, MD   4 mg at 11/25/21 0658   traZODone (DESYREL) tablet 50 mg  50 mg Oral QHS Athena Masse, MD   50 mg at 11/24/21 2228     Abtx:  Anti-infectives (From admission, onward)    Start     Dose/Rate Route Frequency Ordered Stop   11/22/21 1400  ceFEPIme (MAXIPIME) 2 g in sodium chloride 0.9 % 100 mL IVPB  Status:  Discontinued        2 g 200 mL/hr over 30 Minutes Intravenous Every 8 hours 11/22/21 1119 11/22/21 1211   11/22/21 1400  meropenem (MERREM) 1 g in sodium chloride 0.9 % 100 mL IVPB        1 g 200 mL/hr over 30 Minutes Intravenous Every 8 hours 11/22/21 1211     11/21/21 1800  levofloxacin (LEVAQUIN) IVPB 750 mg  Status:  Discontinued        750 mg 100 mL/hr over 90 Minutes Intravenous Every 24 hours 11/21/21 1712 11/22/21 1053   11/21/21 1645  levofloxacin (LEVAQUIN) tablet 750 mg  Status:  Discontinued        750 mg Oral Daily 11/21/21 1550 11/21/21 1712       REVIEW OF SYSTEMS:  Const: negative fever, negative chills, negative weight loss Eyes: negative  diplopia or visual changes, negative eye pain ENT: negative coryza, negative sore throat Resp: negative cough, hemoptysis, dyspnea Cards:  negative for chest pain, palpitations, lower extremity edema GU: As above General GI: Negative for abdominal pain, diarrhea, bleeding, constipation Skin: negative for rash and pruritus Heme: negative for easy bruising and gum/nose bleeding MS: Weakness. Walks with a rollator  Neurolo: Some memory problems history of CVA Psych: negative for feelings of anxiety, depression  Endocrine: negative for thyroid, diabetes Allergy/Immunology-Fentanyl and oxycodone Objective:  VITALS:  BP 116/73 (BP Location: Right Arm)   Pulse 77   Temp 98.2 F (36.8 C) (Oral)   Resp 20   Ht '6\' 1"'$  (1.854 m)   Wt 96.6 kg   SpO2 98%   BMI 28.10 kg/m   PHYSICAL EXAM:  General: Alert, cooperative, no distress, appears stated age.  Head: Normocephalic, without obvious abnormality, atraumatic. Eyes: Conjunctivae clear, anicteric sclerae. Pupils are equal ENT Nares normal. No drainage or sinus tenderness. Lips, mucosa, and tongue normal. No Thrush Neck: Supple, symmetrical, no adenopathy, thyroid: non tender no carotid bruit and no JVD. Back: No CVA tenderness. Lungs: Clear to auscultation bilaterally. No Wheezing or Rhonchi. No rales. Heart: Regular rate and rhythm, no murmur, rub or gallop. Abdomen: Soft, non-tender,not distended. Bowel sounds normal. No masses Extremities: atraumatic, no cyanosis. No edema. No clubbing Skin: No rashes or lesions. Or bruising Lymph: Cervical, supraclavicular normal. Neurologic: Grossly non-focal Pertinent Labs Lab Results CBC    Component Value Date/Time   WBC 7.3 11/25/2021 0626   RBC 3.88 (L) 11/25/2021 0626   HGB 10.8 (L) 11/25/2021 0626   HCT 34.4 (L) 11/25/2021 0626   PLT 187 11/25/2021 0626   MCV 88.7 11/25/2021 0626   MCH 27.8 11/25/2021 0626   MCHC 31.4 11/25/2021 0626   RDW 14.3 11/25/2021 0626   LYMPHSABS 1.9  11/20/2021 2328   MONOABS 0.6 11/20/2021 2328   EOSABS 0.1 11/20/2021 2328   BASOSABS 0.0 11/20/2021 2328       Latest Ref Rng & Units 11/25/2021    6:26 AM 11/24/2021    5:37 AM 11/23/2021    5:29 AM  CMP  Glucose 70 - 99 mg/dL 102  113  101   BUN 8 - 23 mg/dL '26  29  21   '$ Creatinine 0.61 - 1.24 mg/dL 0.97  1.03  1.10   Sodium 135 - 145 mmol/L 138  136  134   Potassium 3.5 - 5.1 mmol/L 3.9  4.0  4.2   Chloride 98 - 111 mmol/L 109  107  106   CO2 22 - 32 mmol/L '23  22  22   '$ Calcium 8.9 - 10.3 mg/dL 8.3  8.6  8.4       Microbiology: Recent Results (from the past 240 hour(s))  SARS Coronavirus 2 by RT PCR (hospital order, performed in [place] hospital lab) *cepheid single result test* Anterior Nasal Swab     Status: None   Collection Time: 11/21/21  4:40 PM   Specimen: Anterior Nasal Swab  Result Value Ref Range Status   SARS Coronavirus 2 by RT PCR NEGATIVE NEGATIVE Final    Comment: (NOTE) SARS-CoV-2 target nucleic acids are NOT DETECTED.  The SARS-CoV-2 RNA is generally detectable in upper and lower respiratory specimens during the acute phase of infection. The lowest concentration of SARS-CoV-2 viral copies this assay can detect is 250 copies / mL. A negative result does not preclude SARS-CoV-2 infection and should not be used as the sole basis for treatment or other patient management decisions.  A negative result may occur with improper specimen collection / handling, submission  of specimen other than nasopharyngeal swab, presence of viral mutation(s) within the areas targeted by this assay, and inadequate number of viral copies (<250 copies / mL). A negative result must be combined with clinical observations, patient history, and epidemiological information.  Fact Sheet for Patients:   https://www.patel.info/  Fact Sheet for Healthcare Providers: https://hall.com/  This test is not yet approved or  cleared by the  [place] FDA and has been authorized for detection and/or diagnosis of SARS-CoV-2 by FDA under an Emergency Use Authorization (EUA).  This EUA will remain in effect (meaning this test can be used) for the duration of the COVID-19 declaration under Section 564(b)(1) of the Act, 21 U.S.C. section 360bbb-3(b)(1), unless the authorization is terminated or revoked sooner.  Performed at [facility], [address] [place], [place] [address]   Culture, blood (Routine X 2) w Reflex to ID Panel     Status: Abnormal   Collection Time: 11/21/21  5:14 PM   Specimen: BLOOD  Result Value Ref Range Status   Specimen Description   Final    BLOOD RW Performed at [facility], [address], [place], [place] [address]    Special Requests   Final    BOTTLES DRAWN AEROBIC AND ANAEROBIC BCAV Performed at [facility], [address] [place], [place] [address]    Culture  Setup Time   Final    ANAEROBIC BOTTLE ONLY GRAM NEGATIVE RODS CRITICAL RESULT CALLED TO, READ BACK BY AND VERIFIED WITH: DEVAN MITCHELL '@1151'$  11/22/21 MJU    Culture (A)  Final    ESCHERICHIA COLI Confirmed Extended Spectrum Beta-Lactamase Producer (ESBL).  In bloodstream infections from ESBL organisms, carbapenems are preferred over piperacillin/tazobactam. They are shown to have a lower risk of mortality.    Report Status 11/25/2021 FINAL  Final   Organism ID, Bacteria ESCHERICHIA COLI  Final      Susceptibility   Escherichia coli - MIC*    AMPICILLIN >=32 RESISTANT Resistant     CEFAZOLIN >=64 RESISTANT Resistant     CEFTAZIDIME RESISTANT Resistant     CEFTRIAXONE >=64 RESISTANT Resistant     CIPROFLOXACIN >=4 RESISTANT Resistant     GENTAMICIN >=16 RESISTANT Resistant     IMIPENEM <=0.25 SENSITIVE Sensitive     TRIMETH/SULFA <=20 SENSITIVE Sensitive     AMPICILLIN/SULBACTAM >=32 RESISTANT Resistant     * ESCHERICHIA COLI  Blood Culture ID Panel (Reflexed)     Status: Abnormal    Collection Time: 11/21/21  5:14 PM  Result Value Ref Range Status   Enterococcus faecalis NOT DETECTED NOT DETECTED Final   Enterococcus Faecium NOT DETECTED NOT DETECTED Final   Listeria monocytogenes NOT DETECTED NOT DETECTED Final   Staphylococcus species NOT DETECTED NOT DETECTED Final   Staphylococcus aureus (BCID) NOT DETECTED NOT DETECTED Final   Staphylococcus epidermidis NOT DETECTED NOT DETECTED Final   Staphylococcus lugdunensis NOT DETECTED NOT DETECTED Final   Streptococcus species NOT DETECTED NOT DETECTED Final   Streptococcus agalactiae NOT DETECTED NOT DETECTED Final   Streptococcus pneumoniae NOT DETECTED NOT DETECTED Final   Streptococcus pyogenes NOT DETECTED NOT DETECTED Final   A.calcoaceticus-baumannii NOT DETECTED NOT DETECTED Final   Bacteroides fragilis NOT DETECTED NOT DETECTED Final   Enterobacterales DETECTED (A) NOT DETECTED Final    Comment: Enterobacterales represent a large order of gram negative bacteria, not a single organism. CRITICAL RESULT CALLED TO, READ BACK BY AND VERIFIED WITH: DEVAN MITCHELL '@1151'$  11/22/21 MJU    Enterobacter cloacae  complex NOT DETECTED NOT DETECTED Final   Escherichia coli DETECTED (A) NOT DETECTED Final    Comment: CRITICAL RESULT CALLED TO, READ BACK BY AND VERIFIED WITH: DEVAN MITCHELL '@1151'$  11/22/21 MJU    Klebsiella aerogenes NOT DETECTED NOT DETECTED Final   Klebsiella oxytoca NOT DETECTED NOT DETECTED Final   Klebsiella pneumoniae NOT DETECTED NOT DETECTED Final   Proteus species NOT DETECTED NOT DETECTED Final   Salmonella species NOT DETECTED NOT DETECTED Final   Serratia marcescens NOT DETECTED NOT DETECTED Final   Haemophilus influenzae NOT DETECTED NOT DETECTED Final   Neisseria meningitidis NOT DETECTED NOT DETECTED Final   Pseudomonas aeruginosa NOT DETECTED NOT DETECTED Final   Stenotrophomonas maltophilia NOT DETECTED NOT DETECTED Final   Candida albicans NOT DETECTED NOT DETECTED Final   Candida auris NOT  DETECTED NOT DETECTED Final   Candida glabrata NOT DETECTED NOT DETECTED Final   Candida krusei NOT DETECTED NOT DETECTED Final   Candida parapsilosis NOT DETECTED NOT DETECTED Final   Candida tropicalis NOT DETECTED NOT DETECTED Final   Cryptococcus neoformans/gattii NOT DETECTED NOT DETECTED Final   CTX-M ESBL DETECTED (A) NOT DETECTED Final    Comment: CRITICAL RESULT CALLED TO, READ BACK BY AND VERIFIED WITH: DEVAN MITCHELL '@1151'$  11/22/21 MJU (NOTE) Extended spectrum beta-lactamase detected. Recommend a carbapenem as initial therapy.      Carbapenem resistance IMP NOT DETECTED NOT DETECTED Final   Carbapenem resistance KPC NOT DETECTED NOT DETECTED Final   Carbapenem resistance NDM NOT DETECTED NOT DETECTED Final   Carbapenem resist OXA 48 LIKE NOT DETECTED NOT DETECTED Final   Carbapenem resistance VIM NOT DETECTED NOT DETECTED Final    Comment: Performed at [facility], [address] [place], [place] [address]  Culture, blood (Routine X 2) w Reflex to ID Panel     Status: None (Preliminary result)   Collection Time: 11/21/21  5:23 PM   Specimen: BLOOD  Result Value Ref Range Status   Specimen Description BLOOD [facility]  Final   Special Requests BOTTLES DRAWN AEROBIC AND ANAEROBIC BCAV  Final   Culture   Final    NO GROWTH 4 DAYS Performed at [facility], [address] [place], [place] [address]    Report Status PENDING  Incomplete    IMAGING RESULTS: CT abdomen and pelvis No hydronephrosis I have personally reviewed the films ? Impression/Recommendation ESBL E. coli bacteremia Source is urinary tract Hematuria.  Patient had CBI and the hematuria has resolved Patient is currently on meropenem We will switch him to ertapenem on discharge and will need for 7 more days.  BPH with urinary retention.  Has Foley.  Awaiting HoLEP procedure  History of nephrectomy for renal carcinoma on the left  side ? Anemia ___________________________________________________ Discussed with patient, requesting provider Note:  This document was prepared using Dragon voice recognition software and may include unintentional dictation errors.

## 2021-11-25 NOTE — Plan of Care (Signed)

## 2021-11-25 NOTE — TOC Progression Note (Signed)
Transition of Care (TOC) - Progression Note    Patient Details  Name: Adam Rios. MRN: [id number] Date of Birth: 1941/01/02  Transition of Care [facility]) CM/SW Contact  Maree Krabbe, LCSW Phone Number: 11/25/2021, 2:15 PM  Clinical Narrative:   CSW left voicemail with pt's daughter and with relative Larita Fife.   Larita Fife returned CSW call and states pt's [name] but that she could get text. CSW texted [name] to please call CSW when she gets a chance to discuss dc plans.     Expected Discharge Plan: Skilled Nursing Facility Barriers to Discharge: Continued Medical Work up, English as a second language teacher  Expected Discharge Plan and Services Expected Discharge Plan: Skilled Nursing Facility In-house Referral: Clinical Social Work     Living arrangements for the past 2 months: Single Family Home                                       Social Determinants of Health (SDOH) Interventions    Readmission Risk Interventions     No data to display

## 2021-11-25 NOTE — Progress Notes (Addendum)
Urology Inpatient Progress Note  Subjective: No acute events overnight.  He is afebrile, VSS. Hemoglobin down today, 10.8, likely dilutional. Foley catheter in place draining clear, yellow urine.  CBI remains clamped. Patient reports feeling well today without any pain.  He remains concerned about the possibility of recurrent gross hematuria after discharge.  Anti-infectives: Anti-infectives (From admission, onward)    Start     Dose/Rate Route Frequency Ordered Stop   11/22/21 1400  ceFEPIme (MAXIPIME) 2 g in sodium chloride 0.9 % 100 mL IVPB  Status:  Discontinued        2 g 200 mL/hr over 30 Minutes Intravenous Every 8 hours 11/22/21 1119 11/22/21 1211   11/22/21 1400  meropenem (MERREM) 1 g in sodium chloride 0.9 % 100 mL IVPB        1 g 200 mL/hr over 30 Minutes Intravenous Every 8 hours 11/22/21 1211     11/21/21 1800  levofloxacin (LEVAQUIN) IVPB 750 mg  Status:  Discontinued        750 mg 100 mL/hr over 90 Minutes Intravenous Every 24 hours 11/21/21 1712 11/22/21 1053   11/21/21 1645  levofloxacin (LEVAQUIN) tablet 750 mg  Status:  Discontinued        750 mg Oral Daily 11/21/21 1550 11/21/21 1712       Current Facility-Administered Medications  Medication Dose Route Frequency Provider Last Rate Last Admin   acetaminophen (TYLENOL) tablet 650 mg  650 mg Oral Q6H PRN Andris Baumann, MD   650 mg at 11/21/21 1549   Or   acetaminophen (TYLENOL) suppository 650 mg  650 mg Rectal Q6H PRN Andris Baumann, MD       atorvastatin (LIPITOR) tablet 40 mg  40 mg Oral Daily Andris Baumann, MD   40 mg at 11/24/21 0865   Chlorhexidine Gluconate Cloth 2 % PADS 6 each  6 each Topical Daily Kathrynn Running, MD   6 each at 11/24/21 1056   donepezil (ARICEPT) tablet 5 mg  5 mg Oral QHS Lindajo Royal V, MD   5 mg at 11/24/21 2228   gabapentin (NEURONTIN) capsule 300 mg  300 mg Oral BID Kathrynn Running, MD   300 mg at 11/24/21 2228   HYDROcodone-acetaminophen (NORCO/VICODIN) 5-325 MG per  tablet 1-2 tablet  1-2 tablet Oral Q4H PRN Andris Baumann, MD   1 tablet at 11/25/21 0404   meropenem (MERREM) 1 g in sodium chloride 0.9 % 100 mL IVPB  1 g Intravenous Q8H Wouk, Wilfred Curtis, MD 200 mL/hr at 11/25/21 0628 1 g at 11/25/21 0628   mirabegron ER (MYRBETRIQ) tablet 25 mg  25 mg Oral Daily Lindajo Royal V, MD   25 mg at 11/24/21 0944   morphine (PF) 2 MG/ML injection 2 mg  2 mg Intravenous Q2H PRN Andris Baumann, MD       ondansetron [facility]) tablet 4 mg  4 mg Oral Q6H PRN Andris Baumann, MD       Or   ondansetron [facility]) injection 4 mg  4 mg Intravenous Q6H PRN Andris Baumann, MD   4 mg at 11/21/21 1954   pantoprazole (PROTONIX) EC tablet 40 mg  40 mg Oral Daily Andris Baumann, MD   40 mg at 11/24/21 7846   senna-docusate (Senokot-S) tablet 1 tablet  1 tablet Oral BID Andris Baumann, MD   1 tablet at 11/24/21 2228   sodium chloride irrigation 0.9 % 3,000 mL  3,000 mL Irrigation Continuous Roxan Hockey,  Luisa Hart, MD   Stopped at 11/24/21 1217   sodium chloride irrigation 0.9 % 3,000 mL  3,000 mL Irrigation Continuous Carman Ching, PA-C   Held at 11/24/21 1534   tamsulosin (FLOMAX) capsule 0.4 mg  0.4 mg Oral Daily Lindajo Royal V, MD   0.4 mg at 11/24/21 0939   tiZANidine (ZANAFLEX) tablet 4 mg  4 mg Oral Q6H PRN Andris Baumann, MD   4 mg at 11/25/21 0658   traZODone (DESYREL) tablet 50 mg  50 mg Oral QHS Andris Baumann, MD   50 mg at 11/24/21 2228   Objective: Vital signs in last 24 hours: Temp:  [97.5 F (36.4 C)-98.1 F (36.7 C)] 97.9 F (36.6 C) (06/27 0800) Pulse Rate:  [64-76] 67 (06/27 0800) Resp:  [17-18] 17 (06/27 0800) BP: (103-121)/(67-73) 103/71 (06/27 0800) SpO2:  [95 %-98 %] 95 % (06/27 0800)  Intake/Output from previous day: 06/26 0701 - 06/27 0700 In: 980 [P.O.:720; IV Piggyback:200] Out: 3725 [Urine:3725] Intake/Output this shift: Total I/O In: -  Out: 50 [Urine:50]  Physical Exam Vitals and nursing note reviewed.  Constitutional:       General: He is not in acute distress.    Appearance: He is not ill-appearing, toxic-appearing or diaphoretic.  HENT:     Head: Normocephalic and atraumatic.  Pulmonary:     Effort: Pulmonary effort is normal. No respiratory distress.  Skin:    General: Skin is warm and dry.  Neurological:     Mental Status: He is alert. Mental status is at baseline.  Psychiatric:        Mood and Affect: Mood normal.        Behavior: Behavior normal.    Lab Results:  Recent Labs    11/24/21 0537 11/25/21 0626  WBC 6.7 7.3  HGB 11.5* 10.8*  HCT 36.2* 34.4*  PLT 183 187   BMET Recent Labs    11/24/21 0537 11/25/21 0626  NA 136 138  K 4.0 3.9  CL 107 109  CO2 22 23  GLUCOSE 113* 102*  BUN 29* 26*  CREATININE 1.03 0.97  CALCIUM 8.6* 8.3*   Assessment & Plan: 81 year old male with a complex urologic history with urinary retention awaiting HOLEP at [facility] managed with chronic indwelling Foley catheter and recurrent MDR UTIs recently treated for UTI now presenting with hematuria requiring CBI and possible recurrent UTI.  Hematuria has resolved and he has passed his clamping trial overnight.  Okay to discontinue CBI, orders placed.  Okay for discharge from the urologic perspective.  Discussed the possibility of HOLEP with Dr. Richardo Hanks.  Unfortunately, the laser in our operating room is currently under maintenance, and will not be available with the power required for HOLEP for at least 2 weeks.  If gross hematuria resumes following discharge, I recommended evaluation at a [facility] facility so that his primary urologic team can evaluate him for possible expedited HOLEP.  Recommendations: -Continue antibiotics for total of 7 to 10 days of therapy -Discontinue CBI -Outpatient follow-up at [facility] for [facility], PA-C 11/25/2021

## 2021-11-25 NOTE — Plan of Care (Signed)

## 2021-11-25 NOTE — Progress Notes (Signed)
Occupational Therapy Treatment Patient Details Name: Adam Rios. MRN: [id number] DOB: March 23, 1941 Today's Date: 11/25/2021   History of present illness Pt is an 81 y/o M admitted on 11/20/21 after presenting to the ED with c/o recurrent hematuria with passage of clots through the Foley & urinary retention. Pt is being treated for recurrent UTI & had a folery catheter exchange, CBI was initiated in the ED. 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 Pt standing with max A from recliner chair with use of RW. Pt initially min A for step pivot transfer but then requiring assistance with advancement of RW and pt needing increased assistance for posterior bias. Sit>supine with mod A for B LEs back to bed. Pt unable to tolerate flat bed and reposition for comfort. OT continues to recommend short term rehab to address functional deficits at discharge before returning home.    Recommendations for follow up therapy are one component of a multi-disciplinary discharge planning process, led by the attending physician.  Recommendations may be updated based on patient status, additional functional criteria and insurance authorization.    Follow Up Recommendations  Skilled nursing-short term rehab (<3 hours/day)    Assistance Recommended at Discharge Frequent or constant Supervision/Assistance  Patient can return home with the following  A lot of help with bathing/dressing/bathroom;A lot of help with walking and/or transfers;Assistance with cooking/housework;Direct supervision/assist for medications management;Help with stairs or ramp for entrance;Assist for transportation   Equipment Recommendations  Other (comment)  (defer to next venue of care)       Precautions / Restrictions Precautions Precautions: Fall Precaution Comments: CBI completed 6/27       Mobility Bed Mobility Overal bed mobility: Needs Assistance Bed Mobility: Sit to Supine       Sit to supine: Mod assist   General bed mobility comments: assistance for B LEs    Transfers Overall transfer level: Needs assistance Equipment used: Rolling walker (2 wheels) Transfers: Sit to/from Stand, Bed to chair/wheelchair/BSC Sit to Stand: Max assist     Step pivot transfers: Mod assist     General transfer comment: Pt needing assistance to advance RW and with posterior bias in standing     Balance Overall balance assessment: Needs assistance Sitting-balance support: Feet supported, Bilateral upper extremity supported Sitting balance-Leahy Scale: Fair Sitting balance - Comments: R lateral lean in sitting   Standing balance support: Reliant on assistive device for balance, During functional activity, Bilateral upper extremity supported Standing balance-Leahy Scale: Poor                             ADL either performed or assessed with clinical judgement    Extremity/Trunk Assessment Upper Extremity Assessment Upper Extremity Assessment: Generalized weakness   Lower Extremity Assessment Lower Extremity Assessment: Generalized weakness        Vision Patient Visual Report: No change from baseline            Cognition Arousal/Alertness: Awake/alert Behavior During Therapy: WFL for tasks assessed/performed Overall Cognitive Status: No family/caregiver present to determine baseline cognitive functioning  General Comments: Pt initially stating it is "august" and he is on "vacation". He was oriented to self and then able to verbalized "hospital" and "june".                   Pertinent Vitals/ Pain       Pain Assessment Pain Assessment: Faces Faces Pain  Scale: Hurts little more Pain Location: back pain Pain Descriptors / Indicators: Discomfort Pain Intervention(s): Repositioned         Frequency  Min 2X/week        Progress Toward Goals  OT Goals(current goals can now be found in the care plan section)  Progress towards OT goals: Progressing toward goals  Acute Rehab OT Goals Patient Stated Goal: to get better and go home OT Goal Formulation: With patient Time For Goal Achievement: 12/07/21 Potential to Achieve Goals: Good  Plan Discharge plan remains appropriate;Frequency remains appropriate       AM-PAC OT "6 Clicks" Daily Activity     Outcome Measure   Help from another person eating meals?: None Help from another person taking care of personal grooming?: A Little Help from another person toileting, which includes using toliet, bedpan, or urinal?: A Lot Help from another person bathing (including washing, rinsing, drying)?: A Lot Help from another person to put on and taking off regular upper body clothing?: A Little Help from another person to put on and taking off regular lower body clothing?: Total 6 Click Score: 15    End of Session Equipment Utilized During Treatment: Rolling walker (2 wheels)  OT Visit Diagnosis: Unsteadiness on feet (R26.81);Muscle weakness (generalized) (M62.81)   Activity Tolerance Patient tolerated treatment well   Patient Left in bed;with call bell/phone within reach;with bed alarm set   Nurse Communication Mobility status        Time: 1610-9604 OT Time Calculation (min): 24 min  Charges: OT General Charges $OT Visit: 1 Visit OT Treatments $Therapeutic Activity: 23-37 mins  Jackquline Denmark, MS, OTR/L , CBIS ascom [phone]  11/25/21, 2:51 PM

## 2021-11-26 ENCOUNTER — Inpatient Hospital Stay: Payer: Self-pay

## 2021-11-26 DIAGNOSIS — Z1612 Extended spectrum beta lactamase (ESBL) resistance: Secondary | ICD-10-CM

## 2021-11-26 DIAGNOSIS — A498 Other bacterial infections of unspecified site: Secondary | ICD-10-CM

## 2021-11-26 DIAGNOSIS — R7881 Bacteremia: Secondary | ICD-10-CM | POA: Diagnosis not present

## 2021-11-26 DIAGNOSIS — D649 Anemia, unspecified: Secondary | ICD-10-CM

## 2021-11-26 DIAGNOSIS — B9620 Unspecified Escherichia coli [E. coli] as the cause of diseases classified elsewhere: Secondary | ICD-10-CM

## 2021-11-26 DIAGNOSIS — N390 Urinary tract infection, site not specified: Secondary | ICD-10-CM

## 2021-11-26 DIAGNOSIS — R310 Gross hematuria: Secondary | ICD-10-CM | POA: Diagnosis not present

## 2021-11-26 DIAGNOSIS — Z905 Acquired absence of kidney: Secondary | ICD-10-CM

## 2021-11-26 DIAGNOSIS — R338 Other retention of urine: Secondary | ICD-10-CM

## 2021-11-26 LAB — CULTURE, BLOOD (ROUTINE X 2): Culture: NO GROWTH

## 2021-11-26 MED ORDER — SODIUM CHLORIDE 0.9% FLUSH: 10.0000 mL | INTRAVENOUS | Status: DC | PRN

## 2021-11-26 MED ORDER — POLYETHYLENE GLYCOL 3350 17 G PO PACK
17.0000 g | PACK | Freq: Two times a day (BID) | ORAL | Status: DC
  Administered 2021-11-26 – 2021-11-27 (×3): 17 g via ORAL
  Filled 2021-11-26 (×5): qty 1

## 2021-11-26 MED ORDER — ERTAPENEM SODIUM 1 G IJ SOLR
1.0000 g | INTRAMUSCULAR | Status: DC
  Administered 2021-11-27 – 2021-11-28 (×2): 1000 mg via INTRAVENOUS
  Filled 2021-11-26 (×2): qty 1

## 2021-11-26 MED ORDER — SODIUM CHLORIDE 0.9 % IV SOLN: INTRAVENOUS | Status: DC | PRN

## 2021-11-26 MED ORDER — SODIUM CHLORIDE 0.9% FLUSH
10.0000 mL | Freq: Two times a day (BID) | INTRAVENOUS | Status: DC
  Administered 2021-11-26 – 2021-11-28 (×4): 10 mL

## 2021-11-26 NOTE — Progress Notes (Signed)

## 2021-11-26 NOTE — Progress Notes (Signed)
Occupational Therapy Treatment Patient Details Name: Adam Rios. MRN: [id number] DOB: February 12, 1941 Today's Date: 11/26/2021   History of present illness Pt is an 81 y/o M admitted on 11/20/21 after presenting to the ED with c/o recurrent hematuria with passage of clots through the Foley & urinary retention. Pt is being treated for recurrent UTI & had a folery catheter exchange, CBI was initiated in the ED. 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 Upon arrival to room pt awake and alert, seated upright on BSC. Pt reporting no pain. Pt agreeable to tx focused on functional mobility and ADL t/f. Pt required MOD A + RW with mod vcs for sequencing and safe hand placement for STS and BSC>bed t/f. MAX A + RW for pericare in standing. MOD A with HOB elevated for sit>supine - needs assistance with B LE. Pt making good progress toward goals. Pt continues to benefit from skilled OT services to maximize return to PLOF and minimize risk of future falls, injury. Will continue to follow POC. Discharge recommendation remains appropriate.     Recommendations for follow up therapy are one component of a multi-disciplinary discharge planning process, led by the attending physician.  Recommendations may be updated based on patient status, additional functional criteria and insurance authorization.    Follow Up Recommendations  Skilled nursing-short term rehab (<3 hours/day)    Assistance Recommended at Discharge Frequent or constant Supervision/Assistance  Patient can return home with the following  A lot of help with bathing/dressing/bathroom;A lot of help with walking and/or transfers;Assistance with cooking/housework;Direct supervision/assist for medications management;Help with stairs or ramp for  entrance;Assist for transportation   Equipment Recommendations  Other (comment) (defer to next venue of care)    Recommendations for Other Services      Precautions / Restrictions Precautions Precautions: Fall Precaution Comments: CBI completed 6/27 Restrictions Weight Bearing Restrictions: No       Mobility Bed Mobility Overal bed mobility: Needs Assistance Bed Mobility: Sit to Supine       Sit to supine: Mod assist        Transfers Overall transfer level: Needs assistance Equipment used: Rolling walker (2 wheels) Transfers: Sit to/from Stand Sit to Stand: Mod assist                 Balance Overall balance assessment: Needs assistance Sitting-balance support: Bilateral upper extremity supported, Feet supported Sitting balance-Leahy Scale: Fair     Standing balance support: Reliant on assistive device for balance, Bilateral upper extremity supported, During functional activity Standing balance-Leahy Scale: Poor                             ADL either performed or assessed with clinical judgement   ADL Overall ADL's : Needs assistance/impaired                                       General ADL Comments: MAX A for pericare in standing. MOD A + RW with mod vcs for sequencing and safe hand placement for BSC>bed t/f.      Cognition Arousal/Alertness: Awake/alert Behavior During Therapy: WFL for tasks assessed/performed Overall Cognitive Status: No family/caregiver present to  determine baseline cognitive functioning                                                General Comments Pt tolerated sitting EOB x 6 minutes unsupported while reaching out of BOS without LOB    Pertinent Vitals/ Pain       Pain Assessment Pain Assessment: No/denies pain   Frequency  Min 2X/week        Progress Toward Goals  OT Goals(current goals can now be found in the care plan section)  Progress towards OT goals:  Progressing toward goals  Acute Rehab OT Goals Patient Stated Goal: to go home OT Goal Formulation: With patient Time For Goal Achievement: 12/07/21 Potential to Achieve Goals: Fair ADL Goals Pt Will Perform Grooming: with mod assist;standing Pt Will Perform Lower Body Dressing: with mod assist;sit to/from stand Pt Will Transfer to Toilet: with mod assist;ambulating;bedside commode Pt Will Perform Toileting - Clothing Manipulation and hygiene: with mod assist;sit to/from stand  Plan Discharge plan remains appropriate;Frequency remains appropriate       AM-PAC OT "6 Clicks" Daily Activity     Outcome Measure   Help from another person eating meals?: None Help from another person taking care of personal grooming?: A Little Help from another person toileting, which includes using toliet, bedpan, or urinal?: A Lot Help from another person bathing (including washing, rinsing, drying)?: A Lot Help from another person to put on and taking off regular upper body clothing?: A Little Help from another person to put on and taking off regular lower body clothing?: Total 6 Click Score: 15    End of Session Equipment Utilized During Treatment: Gait belt;Rolling walker (2 wheels)  OT Visit Diagnosis: Unsteadiness on feet (R26.81);Muscle weakness (generalized) (M62.81)   Activity Tolerance Patient tolerated treatment well   Patient Left in bed;with call bell/phone within reach;with bed alarm set   Nurse Communication Other (comment) (observed blood leaking from catheter site)        Time: 5643-3295 OT Time Calculation (min): 36 min  Charges: OT General Charges $OT Visit: 1 Visit OT Treatments $Self Care/Home Management : 23-37 mins  [employer] OTDS  [employer] 11/26/2021, 4:37 PM

## 2021-11-26 NOTE — Treatment Plan (Signed)
Diagnosis: ESBL e.coli bacteremia Baseline Creatinine <1    Allergies  Allergen Reactions   Fentanyl Nausea Only   Oxycodone Nausea And Vomiting    OPAT Orders Discharge antibiotics: Ertapenem 1 gram IV every 24 hours Duration: 10 days End Date:12/01/21  [facility] Per Protocol:  Labs weekly while on IV antibiotics: _X_ CBC with differential  X__ CMP   _X_ Please pull PIC at completion of IV antibiotics   Fax weekly lab results  promptly to [phone]  Clinic Follow Up Appt:not needed with ID   Call [phone] with any questions

## 2021-11-26 NOTE — Progress Notes (Signed)
PROGRESS NOTE    Adam Rios.  LFY:[id number] DOB: Jan 29, 1941 DOA: 11/20/2021 PCP: Dola Argyle, MD    Brief Narrative:  `81 y.o. 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  He was seen in the ED on 6/17 for UTI.  Urine culture on 6/17 grew Pseudomonas sensitive mostly to the IV antibiotics except for Cipro.  He was treated with a dose of Rocephin and discharged on Omnicef.  He currently denies fever, abdominal pain, nausea and vomiting ED course and data review: Vitals within normal limits Blood work: CBC and CMP mostly unremarkable   Patient had a Foley catheter exchange and CBI was initiated in the ED.  Patient had relief with catheter exchange.  Infectious disease engaged for comment.  Further recommendations patient will need carbapenem with antibiotic end date of 7/3.     Assessment & Plan:   Principal Problem:   Hematuria Active Problems:   BPH with obstruction/lower urinary tract symptoms   History of recurrent UTI and ESBL UTI    Renal cell carcinoma s/p nephrectomy, left   Essential hypertension   Neurogenic bladder   Renal cell cancer (HCC)   History of CVA (cerebrovascular accident)   Frailty   Bacteremia  Neurogenic bladder Chronic indwelling Foley Recurrent uti ESBL E. coli bacteremia Patient's chronic indwelling Foley is managed by outpatient urology at [facility] Has upcoming appointment in August Recently treated with cefdinir for Pseudomonas UTI Last fever here noted 6/24 1/2 blood culture with ESBL E. Coli Plan: Continue Carbapenem Place midline IV ertapenem as outpatient Outpatient antibiotic orders have been  placed Continue indwelling Foley Continue home Flomax and Myrbetriq  Chronic debility Patient declined skilled nursing facility [facility] discussed with daughter.  She is okay with home with home health We will do teaching for outpatient antibiotics prior to discharge  Hypertension Currently holding home Lasix, spironolactone, losartan BP low normal Restart carefully at time of discharge  Dementia PTA donepezil  Chronic pain PTA gabapentin  DVT prophylaxis: SCD Code Status: Full Family Communication: None today Disposition Plan: Status is: Inpatient Remains inpatient appropriate because: ESBL bacteremia.  On IV antibiotics.  Anticipated date of discharge 6/29.   Level of care: Progressive  Consultants:  ID  Procedures:  None  Antimicrobials: Meropenem   Subjective: Seen and examined.  Reports fatigue.  Also reports not being able to urinate however Foley catheter in place, draining clear urine  Objective: Vitals:   11/26/21 0328 11/26/21 0749 11/26/21 1207 11/26/21 1531  BP: 103/75 114/67 122/77 116/68  Pulse: 63 66 83 87  Resp: '20 17 18 17  '$ Temp: 97.9 F (36.6 C) 98.2 F (36.8 C) 98.1 F (36.7 C) 98 F (36.7 C)  TempSrc: Oral     SpO2: 99% 96% 94% 97%  Weight:      Height:        Intake/Output Summary (Last 24 hours) at 11/26/2021 1608 Last data filed at 11/26/2021 1300 Gross per 24 hour  Intake 340.33 ml  Output 325 ml  Net 15.33 ml   Filed Weights   11/20/21 2118 11/21/21 0447  Weight: 96.6 kg 96.6 kg    Examination:  General exam: NAD.  Appears frail Respiratory system: Lungs clear.  Normal work  of breathing.  Room air Cardiovascular system: S1-S2, RRR, no murmurs, no pedal edema Gastrointestinal system: Soft, NT/ND, normal bowel sounds, Foley in place Central nervous system: Alert and oriented. No focal neurological deficits. Extremities: Symmetric 5 x 5 power. Skin: No rashes, lesions or ulcers Psychiatry: Judgement and insight appear  normal. Mood & affect appropriate.     Data Reviewed: I have personally reviewed following labs and imaging studies  CBC: Recent Labs  Lab 11/20/21 2328 11/21/21 0507 11/22/21 1610 11/23/21 0529 11/24/21 0537 11/25/21 0626  WBC 7.3 10.0 9.7 7.0 6.7 7.3  NEUTROABS 4.6  --   --   --   --   --   HGB 11.7* 11.6* 11.5* 11.3* 11.5* 10.8*  HCT 37.1* 36.9* 35.8* 35.3* 36.2* 34.4*  MCV 89.2 87.9 86.9 86.7 87.9 88.7  PLT 180 182 178 159 183 960   Basic Metabolic Panel: Recent Labs  Lab 11/20/21 2328 11/22/21 0623 11/23/21 0529 11/24/21 0537 11/25/21 0626  NA 137 132* 134* 136 138  K 3.6 4.5 4.2 4.0 3.9  CL 112* 104 106 107 109  CO2 21* '23 22 22 23  '$ GLUCOSE 79 112* 101* 113* 102*  BUN '17 18 21 '$ 29* 26*  CREATININE 0.81 1.07 1.10 1.03 0.97  CALCIUM 7.6* 8.6* 8.4* 8.6* 8.3*   GFR: Estimated Creatinine Clearance: 73.2 mL/min (by C-G formula based on SCr of 0.97 mg/dL). Liver Function Tests: Recent Labs  Lab 11/20/21 2328  AST 16  ALT 13  ALKPHOS 63  BILITOT 0.5  PROT 6.7  ALBUMIN 3.3*   No results for input(s): "LIPASE", "AMYLASE" in the last 168 hours. No results for input(s): "AMMONIA" in the last 168 hours. Coagulation Profile: No results for input(s): "INR", "PROTIME" in the last 168 hours. Cardiac Enzymes: No results for input(s): "CKTOTAL", "CKMB", "CKMBINDEX", "TROPONINI" in the last 168 hours. BNP (last 3 results) No results for input(s): "PROBNP" in the last 8760 hours. HbA1C: No results for input(s): "HGBA1C" in the last 72 hours. CBG: Recent Labs  Lab 11/23/21 1643  GLUCAP 117*   Lipid Profile: No results for input(s): "CHOL", "HDL", "LDLCALC", "TRIG", "CHOLHDL", "LDLDIRECT" in the last 72 hours. Thyroid Function Tests: No results for input(s): "TSH", "T4TOTAL", "FREET4", "T3FREE", "THYROIDAB" in the last 72 hours. Anemia Panel: No results for input(s): "VITAMINB12", "FOLATE", "FERRITIN", "TIBC", "IRON", "RETICCTPCT" in the last 72 hours. Sepsis  Labs: No results for input(s): "PROCALCITON", "LATICACIDVEN" in the last 168 hours.  Recent Results (from the past 240 hour(s))  SARS Coronavirus 2 by RT PCR (hospital order, performed in [facility] hospital lab) *cepheid single result test* Anterior Nasal Swab     Status: None   Collection Time: 11/21/21  4:40 PM   Specimen: Anterior Nasal Swab  Result Value Ref Range Status   SARS Coronavirus 2 by RT PCR NEGATIVE NEGATIVE Final    Comment: (NOTE) SARS-CoV-2 target nucleic acids are NOT DETECTED.  The SARS-CoV-2 RNA is generally detectable in upper and lower respiratory specimens during the acute phase of infection. The lowest concentration of SARS-CoV-2 viral copies this assay can detect is 250 copies / mL. A negative result does not preclude SARS-CoV-2 infection and should not be used as the sole basis for treatment or other patient management decisions.  A negative result may occur with improper specimen collection / handling, submission of specimen other than nasopharyngeal swab, presence of viral mutation(s) within the areas targeted by this assay, and inadequate number of viral copies (<250 copies / mL). A negative  result must be combined with clinical observations, patient history, and epidemiological information.  Fact Sheet for Patients:   https://www.patel.info/  Fact Sheet for Healthcare Providers: https://hall.com/  This test is not yet approved or  cleared by the [place] FDA and has been authorized for detection and/or diagnosis of SARS-CoV-2 by FDA under an Emergency Use Authorization (EUA).  This EUA will remain in effect (meaning this test can be used) for the duration of the COVID-19 declaration under Section 564(b)(1) of the Act, 21 U.S.C. section 360bbb-3(b)(1), unless the authorization is terminated or revoked sooner.  Performed at [facility], [address] [place], [place] [address]    Culture, blood (Routine X 2) w Reflex to ID Panel     Status: Abnormal   Collection Time: 11/21/21  5:14 PM   Specimen: BLOOD  Result Value Ref Range Status   Specimen Description   Final    BLOOD RW Performed at [facility], [address], [place], [place] [address]    Special Requests   Final    BOTTLES DRAWN AEROBIC AND ANAEROBIC BCAV Performed at [facility], [address] [place], [place] [address]    Culture  Setup Time   Final    ANAEROBIC BOTTLE ONLY GRAM NEGATIVE RODS CRITICAL RESULT CALLED TO, READ BACK BY AND VERIFIED WITH: DEVAN MITCHELL '@1151'$  11/22/21 MJU    Culture (A)  Final    ESCHERICHIA COLI Confirmed Extended Spectrum Beta-Lactamase Producer (ESBL).  In bloodstream infections from ESBL organisms, carbapenems are preferred over piperacillin/tazobactam. They are shown to have a lower risk of mortality.    Report Status 11/25/2021 FINAL  Final   Organism ID, Bacteria ESCHERICHIA COLI  Final      Susceptibility   Escherichia coli - MIC*    AMPICILLIN >=32 RESISTANT Resistant     CEFAZOLIN >=64 RESISTANT Resistant     CEFTAZIDIME RESISTANT Resistant     CEFTRIAXONE >=64 RESISTANT Resistant     CIPROFLOXACIN >=4 RESISTANT Resistant     GENTAMICIN >=16 RESISTANT Resistant     IMIPENEM <=0.25 SENSITIVE Sensitive     TRIMETH/SULFA <=20 SENSITIVE Sensitive     AMPICILLIN/SULBACTAM >=32 RESISTANT Resistant     * ESCHERICHIA COLI  Blood Culture ID Panel (Reflexed)     Status: Abnormal   Collection Time: 11/21/21  5:14 PM  Result Value Ref Range Status   Enterococcus faecalis NOT DETECTED NOT DETECTED Final   Enterococcus Faecium NOT DETECTED NOT DETECTED Final   Listeria monocytogenes NOT DETECTED NOT DETECTED Final   Staphylococcus species NOT DETECTED NOT DETECTED Final   Staphylococcus aureus (BCID) NOT DETECTED NOT DETECTED Final   Staphylococcus epidermidis NOT DETECTED NOT DETECTED Final   Staphylococcus lugdunensis NOT DETECTED NOT  DETECTED Final   Streptococcus species NOT DETECTED NOT DETECTED Final   Streptococcus agalactiae NOT DETECTED NOT DETECTED Final   Streptococcus pneumoniae NOT DETECTED NOT DETECTED Final   Streptococcus pyogenes NOT DETECTED NOT DETECTED Final   A.calcoaceticus-baumannii NOT DETECTED NOT DETECTED Final   Bacteroides fragilis NOT DETECTED NOT DETECTED Final   Enterobacterales DETECTED (A) NOT DETECTED Final    Comment: Enterobacterales represent a large order of gram negative bacteria, not a single organism. CRITICAL RESULT CALLED TO, READ BACK BY AND VERIFIED WITH: DEVAN MITCHELL '@1151'$  11/22/21 MJU    Enterobacter cloacae complex NOT DETECTED NOT DETECTED Final   Escherichia coli DETECTED (A) NOT DETECTED Final    Comment: CRITICAL RESULT CALLED TO, READ BACK BY AND VERIFIED WITH:  DEVAN MITCHELL '@1151'$  11/22/21 MJU    Klebsiella aerogenes NOT DETECTED NOT DETECTED Final   Klebsiella oxytoca NOT DETECTED NOT DETECTED Final   Klebsiella pneumoniae NOT DETECTED NOT DETECTED Final   Proteus species NOT DETECTED NOT DETECTED Final   Salmonella species NOT DETECTED NOT DETECTED Final   Serratia marcescens NOT DETECTED NOT DETECTED Final   Haemophilus influenzae NOT DETECTED NOT DETECTED Final   Neisseria meningitidis NOT DETECTED NOT DETECTED Final   Pseudomonas aeruginosa NOT DETECTED NOT DETECTED Final   Stenotrophomonas maltophilia NOT DETECTED NOT DETECTED Final   Candida albicans NOT DETECTED NOT DETECTED Final   Candida auris NOT DETECTED NOT DETECTED Final   Candida glabrata NOT DETECTED NOT DETECTED Final   Candida krusei NOT DETECTED NOT DETECTED Final   Candida parapsilosis NOT DETECTED NOT DETECTED Final   Candida tropicalis NOT DETECTED NOT DETECTED Final   Cryptococcus neoformans/gattii NOT DETECTED NOT DETECTED Final   CTX-M ESBL DETECTED (A) NOT DETECTED Final    Comment: CRITICAL RESULT CALLED TO, READ BACK BY AND VERIFIED WITH: DEVAN MITCHELL '@1151'$  11/22/21  MJU (NOTE) Extended spectrum beta-lactamase detected. Recommend a carbapenem as initial therapy.      Carbapenem resistance IMP NOT DETECTED NOT DETECTED Final   Carbapenem resistance KPC NOT DETECTED NOT DETECTED Final   Carbapenem resistance NDM NOT DETECTED NOT DETECTED Final   Carbapenem resist OXA 48 LIKE NOT DETECTED NOT DETECTED Final   Carbapenem resistance VIM NOT DETECTED NOT DETECTED Final    Comment: Performed at [facility], [address] [place], [place] [address]  Culture, blood (Routine X 2) w Reflex to ID Panel     Status: None   Collection Time: 11/21/21  5:23 PM   Specimen: BLOOD  Result Value Ref Range Status   Specimen Description BLOOD [facility]  Final   Special Requests BOTTLES DRAWN AEROBIC AND ANAEROBIC BCAV  Final   Culture   Final    NO GROWTH 5 DAYS Performed at [facility], [address] [place], [place] [address]    Report Status 11/26/2021 FINAL  Final  Culture, blood (Routine X 2) w Reflex to ID Panel     Status: None (Preliminary result)   Collection Time: 11/25/21 10:54 AM   Specimen: BLOOD  Result Value Ref Range Status   Specimen Description BLOOD RIGHT ANTECUBITAL  Final   Special Requests   Final    BOTTLES DRAWN AEROBIC AND ANAEROBIC Blood Culture adequate volume   Culture   Final    NO GROWTH < 24 HOURS Performed at [facility], [address], [place], [place] [address]    Report Status PENDING  Incomplete  Culture, blood (Routine X 2) w Reflex to ID Panel     Status: None (Preliminary result)   Collection Time: 11/25/21 11:02 AM   Specimen: BLOOD  Result Value Ref Range Status   Specimen Description BLOOD BLOOD LEFT HAND  Final   Special Requests   Final    BOTTLES DRAWN AEROBIC AND ANAEROBIC Blood Culture adequate volume   Culture   Final    NO GROWTH < 24 HOURS Performed at [facility], [address], [place], [place] [address]    Report Status PENDING  Incomplete          Radiology Studies: [place] EKG SITE RITE  Result Date: 11/26/2021 If Site Rite image not attached, placement could not be confirmed due to current cardiac rhythm.       Scheduled Meds:  atorvastatin  40 mg Oral Daily   Chlorhexidine Gluconate Cloth  6 each Topical Daily   donepezil  5 mg Oral QHS   gabapentin  300 mg Oral BID   mirabegron ER  25 mg Oral Daily   pantoprazole  40 mg Oral Daily   polyethylene glycol  17 g Oral BID   senna-docusate  1 tablet Oral BID   tamsulosin  0.4 mg Oral Daily   traZODone  50 mg Oral QHS   Continuous Infusions:  meropenem (MERREM) IV 1 g (11/26/21 1345)     LOS: 5 days     Sidney Ace, MD [facility]   If 7PM-7AM, please contact night-coverage  11/26/2021, 4:08 PM

## 2021-11-26 NOTE — Progress Notes (Signed)
   Date of Admission:  11/20/2021     ID: Adam Locket. is a 81 y.o. male  Principal Problem:   Hematuria Active Problems:   Essential hypertension   Neurogenic bladder   Renal cell cancer (HCC)   History of CVA (cerebrovascular accident)   Renal cell carcinoma s/p nephrectomy, left   Frailty   History of recurrent UTI and ESBL UTI    BPH with obstruction/lower urinary tract symptoms   Bacteremia    Subjective: Patient is sitting in chair Says he is feeling better No pain Urine clear  Medications:   atorvastatin  40 mg Oral Daily   Chlorhexidine Gluconate Cloth  6 each Topical Daily   donepezil  5 mg Oral QHS   gabapentin  300 mg Oral BID   mirabegron ER  25 mg Oral Daily   pantoprazole  40 mg Oral Daily   polyethylene glycol  17 g Oral BID   senna-docusate  1 tablet Oral BID   tamsulosin  0.4 mg Oral Daily   traZODone  50 mg Oral QHS    Objective: Vital signs in last 24 hours: Temp:  [97.9 F (36.6 C)-98.2 F (36.8 C)] 98 F (36.7 C) (06/28 1531) Pulse Rate:  [63-87] 87 (06/28 1531) Resp:  [17-20] 17 (06/28 1531) BP: (103-122)/(67-77) 116/68 (06/28 1531) SpO2:  [94 %-99 %] 97 % (06/28 1531)  LDA Foley  PHYSICAL EXAM:  General: Alert, cooperative, no distress, appears stated age.  Lungs: Clear to auscultation bilaterally. No Wheezing or Rhonchi. No rales. Heart: Regular rate and rhythm, no murmur, rub or gallop. Abdomen: Soft,  Foley catheter.  Clear urine Extremities: atraumatic, no cyanosis. No edema. No clubbing Skin: No rashes or lesions. Or bruising Lymph: Cervical, supraclavicular normal. Neurologic: Grossly non-focal  Lab Results Recent Labs    11/24/21 0537 11/25/21 0626  WBC 6.7 7.3  HGB 11.5* 10.8*  HCT 36.2* 34.4*  NA 136 138  K 4.0 3.9  CL 107 109  CO2 22 23  BUN 29* 26*  CREATININE 1.03 0.97     Assessment/Plan: ESBL E. coli bacteremia secondary to urinary tract infection Hematuria.  Seen by urology and underwent  continuous bladder irrigation and hematuria has resolved Patient is currently on meropenem.  Changed to ertapenem and continue IV until 12/01/2021.  BPH with urinary retention.  Has Foley for that.  Awaiting HoLEP procedure  History of left nephrectomy for renal carcinoma.  Anemia  Discussed the management with patient and care team

## 2021-11-26 NOTE — TOC Initial Note (Addendum)
Transition of Care (TOC) - Initial/Assessment Note    Patient Details  Name: Adam Rios. MRN: [id number] Date of Birth: 1940-09-28  Transition of Care [facility]) CM/SW Contact:    Eileen Stanford, LCSW Phone Number: 11/26/2021, 11:34 AM  Clinical Narrative:    CSW spoke with pt and pt is refusing SNF stating he will be dc home back to his daughters house. However, CSW unable to get in touch with daughter. Another voicemail left today.     CSW received a call back from pt's daughter. Pt's daughter states she is agreeable for pt to return home at d/c. Pt's daughter states pt is active with [facility], OT, and RN and they are agreeable for pt to resume services with [facility]. Pt's daughter was a bit resistant to home IV antibiotics idea stating that she is not sure she would be able to administer. MD notified and will discuss with ID. Pt will not dc today- daughter notified.         Expected Discharge Plan: Skilled Nursing Facility Barriers to Discharge: Continued Medical Work up, Ship broker   Patient Goals and CMS Choice   CMS Medicare.gov Compare Post Acute Care list provided to:: Patient Represenative (must comment) [name], daughter) Choice offered to / list presented to : Adult Children  Expected Discharge Plan and Services Expected Discharge Plan: [place] In-house Referral: Clinical Social Work     Living arrangements for the past 2 months: Single Family Home                                      Prior Living Arrangements/Services Living arrangements for the past 2 months: Single Family Home Lives with:: Self, Adult Children Patient language and need for interpreter reviewed:: Yes Do you feel safe going back to the place where you live?: Yes      Need for Family Participation in Patient Care: Yes (Comment) Care giver support system in place?: Yes (comment)   Criminal Activity/Legal Involvement Pertinent to Current  Situation/Hospitalization: No - Comment as needed  Activities of Daily Living Home Assistive Devices/Equipment: Shower chair with back, Wheelchair, Environmental consultant (specify type) (Rollator) ADL Screening (condition at time of admission) Patient's cognitive ability adequate to safely complete daily activities?: No Is the patient deaf or have difficulty hearing?: No Does the patient have difficulty seeing, even when wearing glasses/contacts?: No Does the patient have difficulty concentrating, remembering, or making decisions?: Yes Patient able to express need for assistance with ADLs?: Yes Does the patient have difficulty dressing or bathing?: Yes Independently performs ADLs?: No Communication: Independent Dressing (OT): Needs assistance Is this a change from baseline?: Pre-admission baseline Grooming: Needs assistance Is this a change from baseline?: Pre-admission baseline Feeding: Independent Bathing: Needs assistance Is this a change from baseline?: Pre-admission baseline Toileting: Independent In/Out Bed: Independent Walks in Home: Independent Does the patient have difficulty walking or climbing stairs?: Yes Weakness of Legs: Both Weakness of Arms/Hands: None  Permission Sought/Granted Permission sought to share information with : Case Manager, Customer service manager, Family Supports    Share Information with NAME: Sundance Moise  Permission granted to share info w AGENCY: SNFs  Permission granted to share info w Relationship: Daughter  Permission granted to share info w Contact Information: [phone]  Emotional Assessment       Orientation: : Oriented to Self, Oriented to Place, Oriented to  Time, Oriented to Situation Alcohol /  Substance Use: Not Applicable Psych Involvement: No (comment)  Admission diagnosis:  Acute urinary retention [R33.8] Hematuria [R31.9] Hematuria, unspecified type [R31.9] Patient Active Problem List   Diagnosis Date Noted   Bacteremia  11/22/2021   Hematuria 11/21/2021   Renal cell carcinoma s/p nephrectomy, left 11/21/2021   History of recurrent UTI and ESBL UTI  11/21/2021   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   UTI (urinary tract infection) 09/09/2021   Neuropathy 09/09/2021   History of CVA (cerebrovascular accident) 09/09/2021   Fall 05/14/2021   Frailty 03/04/2018   Acute encephalopathy 01/30/2018   Chronic kidney disease 04/05/2017   GERD (gastroesophageal reflux disease) 04/05/2017   Pain 04/05/2017   Weakness 04/05/2017   Recurrent UTI 01/20/2016   Essential hypertension 07/31/2014   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 04/10/2014   Ventral hernia without obstruction or gangrene 04/10/2014   Acute low back pain without sciatica 12/26/2013   Fusion of spine of lumbar region 12/26/2013   Edema 09/26/2013   Hemorrhoid 09/26/2013   Neurogenic bladder 09/26/2013   Renal cell cancer ([place]) 09/26/2013   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

## 2021-11-26 NOTE — Progress Notes (Signed)
PHARMACY CONSULT NOTE FOR:  OUTPATIENT  PARENTERAL ANTIBIOTIC THERAPY (OPAT)  Indication: ESBL E coli Bacteremia Regimen: Ertapenem 1gm IV q24h End date: 12/01/2021  Please pull PIC at completion of IV antibiotics Weekly CBC/diff and CMP Fax weekly lab results  promptly to [phone]  IV antibiotic discharge orders are pended. To discharging provider:  please sign these orders via discharge navigator,  Select New Orders & click on the button choice - Manage This Unsigned Work.     Thank you for allowing pharmacy to be a part of this patient's care.  Doreene Eland, PharmD, BCPS, BCIDP Work Cell: [phone] 11/26/2021 10:24 AM

## 2021-11-26 NOTE — Progress Notes (Signed)
Physical Therapy Treatment Patient Details Name: Adam Rios. MRN: [id number] DOB: [birth year] Today's Date: 11/26/2021   History of Present Illness Pt is an 81 y/o M admitted on 11/20/21 after presenting to the ED with c/o recurrent hematuria with passage of clots through the Foley & urinary retention. Pt is being treated for recurrent UTI & had a folery catheter exchange, CBI was initiated in the ED. 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 Pt was able to stand from elevated surface and side step with support of RW to bedside recliner with Min Guard however remains a high fall risk. Pt is declining SNF placement despite recommendations due to pt living alone and daughter is currently out of town and cannot provide 24 assist. Will continue to recommend short term stay at SNF once medically cleared prior to returning home.    Recommendations for follow up therapy are one component of a multi-disciplinary discharge planning process, led by the attending physician.  Recommendations may be updated based on patient status, additional functional criteria and insurance authorization.  Follow Up Recommendations  Skilled nursing-short term rehab (<3 hours/day) Can patient physically be transported by private vehicle: No   Assistance Recommended at Discharge Frequent or constant Supervision/Assistance  Patient can return home with the following A little help with walking and/or transfers;Assist for transportation   Equipment Recommendations  None recommended by PT    Recommendations for Other Services       Precautions / Restrictions Precautions Precautions: Fall Precaution Comments: CBI completed 6/27 Restrictions Weight Bearing Restrictions: No     Mobility  Bed  Mobility Overal bed mobility: Needs Assistance Bed Mobility: Supine to Sit     Supine to sit: HOB elevated, Min assist (Increased time to complete) Sit to supine:  (Not tested)   General bed mobility comments: Pt sleeps in recliner    Transfers Overall transfer level: Needs assistance Equipment used: Rolling walker (2 wheels) Transfers: Sit to/from Stand Sit to Stand: Min assist, Mod assist, From elevated surface           General transfer comment: Pt's recliner assists with standing    Ambulation/Gait Ambulation/Gait assistance: Min guard Gait Distance (Feet): 3 Feet Assistive device: Rolling walker (2 wheels) Gait Pattern/deviations: Step-to pattern, Shuffle, Trunk flexed Gait velocity: decreased     General Gait Details: Pt side shuffled from bed to recliner, unable to tolerate further distance   Stairs             Wheelchair Mobility    Modified Rankin (Stroke Patients Only)       Balance                                            Cognition Arousal/Alertness: Awake/alert Behavior During Therapy: WFL for tasks assessed/performed Overall Cognitive Status: No family/caregiver present to determine baseline cognitive functioning                                 General Comments: Pt is aware of current functional deficits, yet declines SNF        Exercises  General Comments General comments (skin integrity, edema, etc.): Pt tolerated sitting EOB x 6 minutes unsupported while reaching out of BOS without LOB      Pertinent Vitals/Pain Pain Assessment Pain Assessment: No/denies pain    Home Living                          Prior Function            PT Goals (current goals can now be found in the care plan section) Acute Rehab PT Goals Patient Stated Goal: none stated    Frequency    Min 2X/week      PT Plan Current plan remains appropriate    Co-evaluation               AM-PAC PT "6 Clicks" Mobility   Outcome Measure  Help needed turning from your back to your side while in a flat bed without using bedrails?: Total Help needed moving from lying on your back to sitting on the side of a flat bed without using bedrails?: Total Help needed moving to and from a bed to a chair (including a wheelchair)?: A Little Help needed standing up from a chair using your arms (e.g., wheelchair or bedside chair)?: A Lot Help needed to walk in hospital room?: A Lot Help needed climbing 3-5 steps with a railing? : Total 6 Click Score: 10    End of Session Equipment Utilized During Treatment: Gait belt Activity Tolerance: Patient tolerated treatment well Patient left: in chair;with call bell/phone within reach;with chair alarm set Nurse Communication: Mobility status PT Visit Diagnosis: Unsteadiness on feet (R26.81);Difficulty in walking, not elsewhere classified (R26.2);Muscle weakness (generalized) (M62.81)     Time: 1572-6203 PT Time Calculation (min) (ACUTE ONLY): 31 min  Charges:  $Therapeutic Activity: 23-37 mins                    Adam Rios, PTA    Josie Dixon 11/26/2021, 1:33 PM

## 2021-11-27 DIAGNOSIS — R310 Gross hematuria: Secondary | ICD-10-CM | POA: Diagnosis not present

## 2021-11-27 LAB — CBC
HCT: 32.3 % — ABNORMAL LOW (ref 39.0–52.0)
Hemoglobin: 10.3 g/dL — ABNORMAL LOW (ref 13.0–17.0)
MCH: 27.6 pg (ref 26.0–34.0)
MCHC: 31.9 g/dL (ref 30.0–36.0)
MCV: 86.6 fL (ref 80.0–100.0)
Platelets: 218 10*3/uL (ref 150–400)
RBC: 3.73 MIL/uL — ABNORMAL LOW (ref 4.22–5.81)
RDW: 14.1 % (ref 11.5–15.5)
WBC: 8.7 10*3/uL (ref 4.0–10.5)
nRBC: 0 % (ref 0.0–0.2)

## 2021-11-27 LAB — BASIC METABOLIC PANEL
Anion gap: 4 — ABNORMAL LOW (ref 5–15)
BUN: 18 mg/dL (ref 8–23)
CO2: 24 mmol/L (ref 22–32)
Calcium: 8.2 mg/dL — ABNORMAL LOW (ref 8.9–10.3)
Chloride: 108 mmol/L (ref 98–111)
Creatinine, Ser: 0.81 mg/dL (ref 0.61–1.24)
GFR, Estimated: >60 mL/min (ref >60)
Glucose, Bld: 93 mg/dL (ref 70–99)
Potassium: 3.9 mmol/L (ref 3.5–5.1)
Sodium: 136 mmol/L (ref 135–145)

## 2021-11-27 NOTE — TOC Progression Note (Signed)
Transition of Care (TOC) - Progression Note    Patient Details  Name: Adam Rios. MRN: [id number] Date of Birth: [birth year]  Transition of Care [facility] CM/SW Contact  Eileen Stanford, LCSW Phone Number: 11/27/2021, 2:48 PM  Clinical Narrative: Pt is claming that he will have Jeani Hawking (in law) to assist in his care post dc. CSW spoke with Jeani Hawking and she states she will be helping pt post dc and states she can assist in iv antibiotics. Jeani Hawking is agreeable for Pam to contact her to set up a time to do teaching. Per Pam she will teach tomorrow. MD updated.       Expected Discharge Plan: [place] Barriers to Discharge: Continued Medical Work up, Ship broker  Expected Discharge Plan and Services Expected Discharge Plan: [place] In-house Referral: Clinical Social Work     Living arrangements for the past 2 months: Single Family Home                                       Social Determinants of Health (SDOH) Interventions    Readmission Risk Interventions     No data to display

## 2021-11-27 NOTE — TOC Progression Note (Signed)
Transition of Care (TOC) - Progression Note    Patient Details  Name: Adam Rios. MRN: [id number] Date of Birth: December 06, 1940  Transition of Care [facility]) CM/SW Contact  Eileen Stanford, LCSW Phone Number: 11/27/2021, 11:59 AM  Clinical Narrative:   Pam with [facility] to do teaching with pt and daughter however, unable to reach pt's daughter. CSW also tried and unable to reach pt's daughter. MD updated.    Expected Discharge Plan: [place] Barriers to Discharge: Continued Medical Work up, Ship broker  Expected Discharge Plan and Services Expected Discharge Plan: [place] In-house Referral: Clinical Social Work     Living arrangements for the past 2 months: Single Family Home                                       Social Determinants of Health (SDOH) Interventions    Readmission Risk Interventions     No data to display

## 2021-11-27 NOTE — Progress Notes (Signed)
Physical Therapy Treatment Patient Details Name: Adam Rios. MRN: [id number] DOB: May 08, 1941 Today's Date: 11/27/2021   History of Present Illness Pt is an 81 y/o M admitted on 11/20/21 after presenting to the ED with c/o recurrent hematuria with passage of clots through the Foley & urinary retention. Pt is being treated for recurrent UTI & had a folery catheter exchange, CBI was initiated in the ED. 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 Pt stated he had not been in the chair today because he thought he was getting discharged home. Pt required Mod A to transfer from supine to sit EOB with HOB elevated. Pt had severe R lateral lean in sitting with inability to sit without bilat UE support. Pt required Mod-Max Ax2 to stand using RW this date. Pt continued to have severe R lateral lean in standing with inability to correct. Pt had BM in bed and when asked, pt stated "I don't know. I don't feel anything." Pt required total assist for hygiene with Mod Ax2 in standing. Pt unable to take steps in standing, and therefore unable to safely transfer to chair at this time. Pt transferred sit to supine with mod Ax2 for bilat LE mgmt and truncal support. Pt appears to have decreased situational/safety awareness and unaware of deficits. Continue to recommend STR at discharge.     Recommendations for follow up therapy are one component of a multi-disciplinary discharge planning process, led by the attending physician.  Recommendations may be updated based on patient status, additional functional criteria and insurance authorization.  Follow Up Recommendations  Skilled nursing-short term rehab (<3 hours/day) Can patient physically be transported by private vehicle: No   Assistance  Recommended at Discharge Frequent or constant Supervision/Assistance  Patient can return home with the following Two people to help with walking and/or transfers;A lot of help with bathing/dressing/bathroom;Assistance with cooking/housework;Assist for transportation;Help with stairs or ramp for entrance   Equipment Recommendations  None recommended by PT    Recommendations for Other Services       Precautions / Restrictions Precautions Precautions: Fall Precaution Comments: CBI completed 6/27 Restrictions Weight Bearing Restrictions: No     Mobility  Bed Mobility Overal bed mobility: Needs Assistance Bed Mobility: Supine to Sit     Supine to sit: Mod assist, HOB elevated     General bed mobility comments: Pt required mod A to sit EOB with bilat LE mgmt and trunk mgmt    Transfers Overall transfer level: Needs assistance Equipment used: Rolling walker (2 wheels) Transfers: Sit to/from Stand Sit to Stand: Max assist, +2 physical assistance, Mod assist           General transfer comment: Mod-Max Ax2 to stand with RW. Verbal and tactile cues for hand placement.    Ambulation/Gait               General Gait Details: NT pt unsafe to ambulate this date   Stairs             Wheelchair Mobility    Modified Rankin (Stroke Patients Only)       Balance Overall balance assessment: Needs assistance Sitting-balance support: Bilateral upper extremity supported, Feet supported Sitting balance-Leahy Scale: Poor Sitting balance - Comments: EXCESSIVE R lateral lean in sitting with  pt unable to correct with verbal or tactile cues. Pt unable to sit without bilat UE support. Postural control: Right lateral lean Standing balance support: During functional activity, Bilateral upper extremity supported, Reliant on assistive device for balance Standing balance-Leahy Scale: Poor Standing balance comment: Once standing, Mod Ax2 to maintain balance in standing with bilat  UE support on RW. Pt had severe R lateral lean in standing with inability to correct with verbal or tactile cues. Pt unable to take a step in standing.                            Cognition Arousal/Alertness: Awake/alert Behavior During Therapy: Flat affect Overall Cognitive Status: No family/caregiver present to determine baseline cognitive functioning                                 General Comments: Pt appeared unaware of deficits, unaware of having BM stating "I don't know, I don't feel anything." Decreased safety and situational awareness. States he wants to be discharged home today.        Exercises Other Exercises Other Exercises: Pt had BM (pt unaware of BM and stated "I don't know. I don't feel anything.") and required Max A for hygiene in standing.    General Comments        Pertinent Vitals/Pain Pain Assessment Pain Assessment: No/denies pain    Home Living                          Prior Function            PT Goals (current goals can now be found in the care plan section) Acute Rehab PT Goals Patient Stated Goal: to go home today PT Goal Formulation: With patient Time For Goal Achievement: 12/07/21 Potential to Achieve Goals: Fair Progress towards PT goals: Progressing toward goals    Frequency    Min 2X/week      PT Plan Current plan remains appropriate    Co-evaluation              AM-PAC PT "6 Clicks" Mobility   Outcome Measure  Help needed turning from your back to your side while in a flat bed without using bedrails?: A Little Help needed moving from lying on your back to sitting on the side of a flat bed without using bedrails?: A Lot Help needed moving to and from a bed to a chair (including a wheelchair)?: Total Help needed standing up from a chair using your arms (e.g., wheelchair or bedside chair)?: Total Help needed to walk in hospital room?: Total Help needed climbing 3-5 steps with a railing?  : Total 6 Click Score: 9    End of Session Equipment Utilized During Treatment: Gait belt Activity Tolerance: Patient tolerated treatment well Patient left: in bed;with call bell/phone within reach;with bed alarm set Nurse Communication: Mobility status PT Visit Diagnosis: Unsteadiness on feet (R26.81);Difficulty in walking, not elsewhere classified (R26.2);Muscle weakness (generalized) (M62.81)     Time: 8469-6295 PT Time Calculation (min) (ACUTE ONLY): 25 min  Charges:                        Rella Larve, SPT  Tabitha Riggins 11/27/2021, 5:14 PM

## 2021-11-27 NOTE — Progress Notes (Signed)
PROGRESS NOTE    Adam Rios.  ZOX:[id number] DOB: [birth year] DOA: 11/20/2021 PCP: Dola Argyle, MD    Brief Narrative:  `81 y.o. 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  He was seen in the ED on 6/17 for UTI.  Urine culture on 6/17 grew Pseudomonas sensitive mostly to the IV antibiotics except for Cipro.  He was treated with a dose of Rocephin and discharged on Omnicef.  He currently denies fever, abdominal pain, nausea and vomiting ED course and data review: Vitals within normal limits Blood work: CBC and CMP mostly unremarkable   Patient had a Foley catheter exchange and CBI was initiated in the ED.  Patient had relief with catheter exchange.  Infectious disease engaged for comment.  Further recommendations patient will need carbapenem with antibiotic end date of 7/3.    Patient remains on carbapenem, imipenem 1 g daily starting 6/29.  Last antibiotic dose would be 7/3.  At this time we are lacking a safe disposition plan.  Patient does not wish to go to skilled nursing facility and home may not be a safe disposition as there is no one available that can assist this patient with intravenous antibiotics.   Assessment & Plan:   Principal Problem:   Hematuria Active Problems:   BPH with obstruction/lower urinary tract symptoms   History of recurrent UTI and ESBL UTI    Renal cell carcinoma s/p nephrectomy, left   Essential hypertension   Neurogenic bladder   Renal cell cancer (HCC)   History of CVA (cerebrovascular accident)   Frailty   Bacteremia  Neurogenic bladder Chronic indwelling Foley Recurrent uti ESBL E. coli bacteremia Patient's  chronic indwelling Foley is managed by outpatient urology at [facility] Has upcoming appointment in August Recently treated with cefdinir for Pseudomonas UTI Last fever here noted 6/24 1/2 blood culture with ESBL E. Coli Midline in place Plan: Continue ertapenem Last dose 7/3 Continue indwelling Foley Continue home Flomax and Myrbetriq  Chronic debility Patient declined skilled nursing facility We have been unable to reach daughter.  TOC states that family may be out of town.  Patient is not able to handle intravenous antibiotics at home.  At this point we are is lacking a safe disposition plan.  Hypertension Currently holding home Lasix, spironolactone, losartan BP low normal Restart carefully at time of discharge  Dementia PTA donepezil  Chronic pain PTA gabapentin  DVT prophylaxis: SCD Code Status: Full Family Communication: None today Disposition Plan: Status is: Inpatient Remains inpatient appropriate because: ESBL bacteremia.  On IV antibiotics.  Discharge delayed by lack of safe disposition plan   Level of care: Progressive  Consultants:  ID  Procedures:  None  Antimicrobials: Meropenem   Subjective: Seen and examined.  Reports fatigue but improved from prior Objective: Vitals:   11/26/21 2014 11/26/21 2343 11/27/21 0354 11/27/21 0807  BP: 119/88 112/76 110/69 127/79  Pulse: 77 64 66 63  Resp: '20 18 16 15  '$ Temp: 98.4 F (36.9 C) 98.4 F (36.9 C) 98.5 F (36.9 C) 98.5 F (36.9 C)  TempSrc:   Oral   SpO2: 94% 95% 92% 95%  Weight:      Height:        Intake/Output  Summary (Last 24 hours) at 11/27/2021 1332 Last data filed at 11/27/2021 0354 Gross per 24 hour  Intake 209.84 ml  Output 700 ml  Net -490.16 ml   Filed Weights   11/20/21 2118 11/21/21 0447  Weight: 96.6 kg 96.6 kg    Examination:  General exam: NAD.  Frail-appearing Respiratory system: Lungs clear.  Normal work of breathing.  Room air Cardiovascular system: S1-S2, RRR, no  murmurs, no pedal edema Gastrointestinal system: Soft, NT/ND, normal bowel sounds, Foley in place, draining clear urine Central nervous system: Alert and oriented. No focal neurological deficits. Extremities: Symmetric 5 x 5 power. Skin: No rashes, lesions or ulcers Psychiatry: Judgement and insight appear normal. Mood & affect appropriate.     Data Reviewed: I have personally reviewed following labs and imaging studies  CBC: Recent Labs  Lab 11/20/21 2328 11/21/21 0507 11/22/21 7106 11/23/21 0529 11/24/21 0537 11/25/21 0626 11/27/21 0823  WBC 7.3   < > 9.7 7.0 6.7 7.3 8.7  NEUTROABS 4.6  --   --   --   --   --   --   HGB 11.7*   < > 11.5* 11.3* 11.5* 10.8* 10.3*  HCT 37.1*   < > 35.8* 35.3* 36.2* 34.4* 32.3*  MCV 89.2   < > 86.9 86.7 87.9 88.7 86.6  PLT 180   < > 178 159 183 187 218   < > = values in this interval not displayed.   Basic Metabolic Panel: Recent Labs  Lab 11/22/21 0623 11/23/21 0529 11/24/21 0537 11/25/21 0626 11/27/21 0823  NA 132* 134* 136 138 136  K 4.5 4.2 4.0 3.9 3.9  CL 104 106 107 109 108  CO2 '23 22 22 23 24  '$ GLUCOSE 112* 101* 113* 102* 93  BUN 18 21 29* 26* 18  CREATININE 1.07 1.10 1.03 0.97 0.81  CALCIUM 8.6* 8.4* 8.6* 8.3* 8.2*   GFR: Estimated Creatinine Clearance: 87.6 mL/min (by C-G formula based on SCr of 0.81 mg/dL). Liver Function Tests: Recent Labs  Lab 11/20/21 2328  AST 16  ALT 13  ALKPHOS 63  BILITOT 0.5  PROT 6.7  ALBUMIN 3.3*   No results for input(s): "LIPASE", "AMYLASE" in the last 168 hours. No results for input(s): "AMMONIA" in the last 168 hours. Coagulation Profile: No results for input(s): "INR", "PROTIME" in the last 168 hours. Cardiac Enzymes: No results for input(s): "CKTOTAL", "CKMB", "CKMBINDEX", "TROPONINI" in the last 168 hours. BNP (last 3 results) No results for input(s): "PROBNP" in the last 8760 hours. HbA1C: No results for input(s): "HGBA1C" in the last 72 hours. CBG: Recent Labs  Lab  11/23/21 1643  GLUCAP 117*   Lipid Profile: No results for input(s): "CHOL", "HDL", "LDLCALC", "TRIG", "CHOLHDL", "LDLDIRECT" in the last 72 hours. Thyroid Function Tests: No results for input(s): "TSH", "T4TOTAL", "FREET4", "T3FREE", "THYROIDAB" in the last 72 hours. Anemia Panel: No results for input(s): "VITAMINB12", "FOLATE", "FERRITIN", "TIBC", "IRON", "RETICCTPCT" in the last 72 hours. Sepsis Labs: No results for input(s): "PROCALCITON", "LATICACIDVEN" in the last 168 hours.  Recent Results (from the past 240 hour(s))  SARS Coronavirus 2 by RT PCR (hospital order, performed in [facility] hospital lab) *cepheid single result test* Anterior Nasal Swab     Status: None   Collection Time: 11/21/21  4:40 PM   Specimen: Anterior Nasal Swab  Result Value Ref Range Status   SARS Coronavirus 2 by RT PCR NEGATIVE NEGATIVE Final    Comment: (NOTE) SARS-CoV-2 target nucleic acids are NOT DETECTED.  The SARS-CoV-2 RNA is generally detectable in upper and lower respiratory specimens during the acute phase of infection. The lowest concentration of SARS-CoV-2 viral copies this assay can detect is 250 copies / mL. A negative result does not preclude SARS-CoV-2 infection and should not be used as the sole basis for treatment or other patient management decisions.  A negative result may occur with improper specimen collection / handling, submission of specimen other than nasopharyngeal swab, presence of viral mutation(s) within the areas targeted by this assay, and inadequate number of viral copies (<250 copies / mL). A negative result must be combined with clinical observations, patient history, and epidemiological information.  Fact Sheet for Patients:   https://www.patel.info/  Fact Sheet for Healthcare Providers: https://hall.com/  This test is not yet approved or  cleared by the [place] FDA and has been authorized for detection and/or  diagnosis of SARS-CoV-2 by FDA under an Emergency Use Authorization (EUA).  This EUA will remain in effect (meaning this test can be used) for the duration of the COVID-19 declaration under Section 564(b)(1) of the Act, 21 U.S.C. section 360bbb-3(b)(1), unless the authorization is terminated or revoked sooner.  Performed at [facility], [address] [place], [place] [address]   Culture, blood (Routine X 2) w Reflex to ID Panel     Status: Abnormal   Collection Time: 11/21/21  5:14 PM   Specimen: BLOOD  Result Value Ref Range Status   Specimen Description   Final    BLOOD RW Performed at [facility], [address], [place], [place] [address]    Special Requests   Final    BOTTLES DRAWN AEROBIC AND ANAEROBIC BCAV Performed at [facility], [address] [place],  [address]    Culture  Setup Time   Final    ANAEROBIC BOTTLE ONLY GRAM NEGATIVE RODS CRITICAL RESULT CALLED TO, READ BACK BY AND VERIFIED WITH: DEVAN MITCHELL '@1151'$  11/22/21 MJU    Culture (A)  Final    ESCHERICHIA COLI Confirmed Extended Spectrum Beta-Lactamase Producer (ESBL).  In bloodstream infections from ESBL organisms, carbapenems are preferred over piperacillin/tazobactam. They are shown to have a lower risk of mortality.    Report Status 11/25/2021 FINAL  Final   Organism ID, Bacteria ESCHERICHIA COLI  Final      Susceptibility   Escherichia coli - MIC*    AMPICILLIN >=32 RESISTANT Resistant     CEFAZOLIN >=64 RESISTANT Resistant     CEFTAZIDIME RESISTANT Resistant     CEFTRIAXONE >=64 RESISTANT Resistant     CIPROFLOXACIN >=4 RESISTANT Resistant     GENTAMICIN >=16 RESISTANT Resistant     IMIPENEM <=0.25 SENSITIVE Sensitive     TRIMETH/SULFA <=20 SENSITIVE Sensitive     AMPICILLIN/SULBACTAM >=32 RESISTANT Resistant     * ESCHERICHIA COLI  Blood Culture ID Panel (Reflexed)     Status: Abnormal   Collection Time: 11/21/21  5:14 PM  Result Value Ref Range Status    Enterococcus faecalis NOT DETECTED NOT DETECTED Final   Enterococcus Faecium NOT DETECTED NOT DETECTED Final   Listeria monocytogenes NOT DETECTED NOT DETECTED Final   Staphylococcus species NOT DETECTED NOT DETECTED Final   Staphylococcus aureus (BCID) NOT DETECTED NOT DETECTED Final   Staphylococcus epidermidis NOT DETECTED NOT DETECTED Final   Staphylococcus lugdunensis NOT DETECTED NOT DETECTED Final   Streptococcus species NOT DETECTED NOT DETECTED Final   Streptococcus agalactiae NOT DETECTED NOT DETECTED Final   Streptococcus pneumoniae NOT DETECTED NOT DETECTED Final  Streptococcus pyogenes NOT DETECTED NOT DETECTED Final   A.calcoaceticus-baumannii NOT DETECTED NOT DETECTED Final   Bacteroides fragilis NOT DETECTED NOT DETECTED Final   Enterobacterales DETECTED (A) NOT DETECTED Final    Comment: Enterobacterales represent a large order of gram negative bacteria, not a single organism. CRITICAL RESULT CALLED TO, READ BACK BY AND VERIFIED WITH: DEVAN MITCHELL '@1151'$  11/22/21 MJU    Enterobacter cloacae complex NOT DETECTED NOT DETECTED Final   Escherichia coli DETECTED (A) NOT DETECTED Final    Comment: CRITICAL RESULT CALLED TO, READ BACK BY AND VERIFIED WITH: DEVAN MITCHELL '@1151'$  11/22/21 MJU    Klebsiella aerogenes NOT DETECTED NOT DETECTED Final   Klebsiella oxytoca NOT DETECTED NOT DETECTED Final   Klebsiella pneumoniae NOT DETECTED NOT DETECTED Final   Proteus species NOT DETECTED NOT DETECTED Final   Salmonella species NOT DETECTED NOT DETECTED Final   Serratia marcescens NOT DETECTED NOT DETECTED Final   Haemophilus influenzae NOT DETECTED NOT DETECTED Final   Neisseria meningitidis NOT DETECTED NOT DETECTED Final   Pseudomonas aeruginosa NOT DETECTED NOT DETECTED Final   Stenotrophomonas maltophilia NOT DETECTED NOT DETECTED Final   Candida albicans NOT DETECTED NOT DETECTED Final   Candida auris NOT DETECTED NOT DETECTED Final   Candida glabrata NOT DETECTED NOT  DETECTED Final   Candida krusei NOT DETECTED NOT DETECTED Final   Candida parapsilosis NOT DETECTED NOT DETECTED Final   Candida tropicalis NOT DETECTED NOT DETECTED Final   Cryptococcus neoformans/gattii NOT DETECTED NOT DETECTED Final   CTX-M ESBL DETECTED (A) NOT DETECTED Final    Comment: CRITICAL RESULT CALLED TO, READ BACK BY AND VERIFIED WITH: DEVAN MITCHELL '@1151'$  11/22/21 MJU (NOTE) Extended spectrum beta-lactamase detected. Recommend a carbapenem as initial therapy.      Carbapenem resistance IMP NOT DETECTED NOT DETECTED Final   Carbapenem resistance KPC NOT DETECTED NOT DETECTED Final   Carbapenem resistance NDM NOT DETECTED NOT DETECTED Final   Carbapenem resist OXA 48 LIKE NOT DETECTED NOT DETECTED Final   Carbapenem resistance VIM NOT DETECTED NOT DETECTED Final    Comment: Performed at [facility], [address] [place], [place] [address]  Culture, blood (Routine X 2) w Reflex to ID Panel     Status: None   Collection Time: 11/21/21  5:23 PM   Specimen: BLOOD  Result Value Ref Range Status   Specimen Description BLOOD [facility]  Final   Special Requests BOTTLES DRAWN AEROBIC AND ANAEROBIC BCAV  Final   Culture   Final    NO GROWTH 5 DAYS Performed at [facility], [address], [place], [place] [address]    Report Status 11/26/2021 FINAL  Final  Culture, blood (Routine X 2) w Reflex to ID Panel     Status: None (Preliminary result)   Collection Time: 11/25/21 10:54 AM   Specimen: BLOOD  Result Value Ref Range Status   Specimen Description BLOOD RIGHT ANTECUBITAL  Final   Special Requests   Final    BOTTLES DRAWN AEROBIC AND ANAEROBIC Blood Culture adequate volume   Culture   Final    NO GROWTH < 24 HOURS Performed at [facility], [address] [place], [place] [address]    Report Status PENDING  Incomplete  Culture, blood (Routine X 2) w Reflex to ID Panel     Status: None (Preliminary result)   Collection Time: 11/25/21 11:02  AM   Specimen: BLOOD  Result Value Ref Range Status   Specimen Description BLOOD BLOOD LEFT HAND  Final   Special Requests   Final    BOTTLES DRAWN AEROBIC AND ANAEROBIC Blood Culture adequate volume   Culture   Final    NO GROWTH < 24 HOURS Performed at [facility], [address] [place], [place] [address]    Report Status PENDING  Incomplete         Radiology Studies: [place] EKG SITE RITE  Result Date: 11/26/2021 If Site Rite image not attached, placement could not be confirmed due to current cardiac rhythm.       Scheduled Meds:  atorvastatin  40 mg Oral Daily   Chlorhexidine Gluconate Cloth  6 each Topical Daily   donepezil  5 mg Oral QHS   gabapentin  300 mg Oral BID   mirabegron ER  25 mg Oral Daily   pantoprazole  40 mg Oral Daily   polyethylene glycol  17 g Oral BID   senna-docusate  1 tablet Oral BID   sodium chloride flush  10-40 mL Intracatheter Q12H   tamsulosin  0.4 mg Oral Daily   traZODone  50 mg Oral QHS   Continuous Infusions:  sodium chloride Stopped (11/27/21 0656)   ertapenem Stopped (11/27/21 0656)     LOS: 6 days     Sidney Ace, MD [facility]   If 7PM-7AM, please contact night-coverage  11/27/2021, 1:32 PM

## 2021-11-28 DIAGNOSIS — R310 Gross hematuria: Secondary | ICD-10-CM | POA: Diagnosis not present

## 2021-11-28 DIAGNOSIS — R7881 Bacteremia: Secondary | ICD-10-CM | POA: Diagnosis not present

## 2021-11-28 MED ORDER — FUROSEMIDE 40 MG PO TABS: 40.0000 mg | ORAL_TABLET | Freq: Two times a day (BID) | ORAL | Status: DC

## 2021-11-28 MED ORDER — GABAPENTIN 300 MG PO CAPS: 300.0000 mg | ORAL_CAPSULE | Freq: Two times a day (BID) | ORAL | Status: DC

## 2021-11-28 MED ORDER — LOSARTAN POTASSIUM 25 MG PO TABS: 25.0000 mg | ORAL_TABLET | Freq: Every day | ORAL | 0 refills | Status: DC

## 2021-11-28 MED ORDER — ERTAPENEM IV (FOR PTA / DISCHARGE USE ONLY): 1.0000 g | INTRAVENOUS | 0 refills | Status: AC

## 2021-11-28 NOTE — Progress Notes (Signed)
PT Cancellation Note  Patient Details Name: Adam Rios. MRN: [id number] DOB: 04-Mar-1941   Cancelled Treatment:     Therapist in to see pt several times this am, pt very lethargic, unable to maintain eyes open during conversation stating "I didn't sleep well".  Will try back next available date/time.   Josie Dixon 11/28/2021, 11:10 AM

## 2021-11-28 NOTE — TOC Progression Note (Signed)
Transition of Care (TOC) - Progression Note    Patient Details  Name: Adam Rios. MRN: [id number] Date of Birth: 1940/10/31  Transition of Care [facility]) CM/SW Contact  Eileen [place], LCSW Phone Number: 11/28/2021, 9:23 AM  Clinical Narrative:   Pam with [facility] will be meeting with Jeani Hawking between 11:30-12 to do teaching on home iv antibiotics. MD aware.     Expected Discharge Plan: [place] Barriers to Discharge: Continued Medical Work up, Ship broker  Expected Discharge Plan and Services Expected Discharge Plan: [place] In-house Referral: Clinical Social Work     Living arrangements for the past 2 months: Single Family Home                                       Social Determinants of Health (SDOH) Interventions    Readmission Risk Interventions     No data to display

## 2021-11-28 NOTE — Discharge Summary (Signed)
Physician Discharge Summary  Adam Rios. ZOX:[id number] DOB: 22-Sep-1940 DOA: 11/20/2021  PCP: Dola Argyle, MD  Admit date: 11/20/2021 Discharge date: 11/28/2021  Admitted From: Home Disposition:  Home with home health  Recommendations for Outpatient Follow-up:  Follow up with PCP in 1-2 weeks Follow-up with [facility] urology  Home Health: Yes PT OT RN Equipment/Devices: Midline  Discharge Condition: Stable CODE STATUS: Full Diet recommendation: Regular  Brief/Interim Summary:  `81 y.o. 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  He was seen in the ED on 6/17 for UTI.  Urine culture on 6/17 grew Pseudomonas sensitive mostly to the IV antibiotics except for Cipro.  He was treated with a dose of Rocephin and discharged on Omnicef.  He currently denies fever, abdominal pain, nausea and vomiting ED course and data review: Vitals within normal limits Blood work: CBC and CMP mostly unremarkable   Patient had a Foley catheter exchange and CBI was initiated in the ED.  Patient had relief with catheter exchange.  Infectious disease engaged for comment.  Further recommendations patient will need carbapenem with antibiotic end date of 7/3.     Patient remains on carbapenem, imipenem 1 g daily starting 6/29.  Last antibiotic dose would be 7/3.  At this time we are lacking a safe disposition plan.  Patient does not wish to go to skilled nursing facility and home may not be a safe disposition as there is no one available that can assist this patient with intravenous antibiotics.  6/30: Patient's sister agreed to handle intravenous antibiotics at home.  Home infusion RN  performed teaching with family members at bedside on 6/30.  Stable for discharge home at this time.  Home health services ordered.   Discharge Diagnoses:  Principal Problem:   Hematuria Active Problems:   BPH with obstruction/lower urinary tract symptoms   History of recurrent UTI and ESBL UTI    Renal cell carcinoma s/p nephrectomy, left   Essential hypertension   Neurogenic bladder   Renal cell cancer (HCC)   History of CVA (cerebrovascular accident)   Frailty   Bacteremia  Neurogenic bladder Chronic indwelling Foley Recurrent uti ESBL E. coli bacteremia Patient's chronic indwelling Foley is managed by outpatient urology at [facility] Has upcoming appointment in August Recently treated with cefdinir for Pseudomonas UTI Last fever here noted 6/24 1/2 blood culture with ESBL E. Coli Midline in place Plan: Discharge home with home health services Continue ertapenem Last dose 7/3 Continue indwelling Foley Continue home Flomax and Myrbetriq Follow-up with outpatient urology at [place] debility Patient declined skilled nursing facility Patient's family member/sister presented and agreed to assist with home care and home intravenous antibiotics.   Hypertension Currently holding home Lasix, spironolactone, losartan BP low normal Restart carefully at time of discharge Outpatient PCP follow-up   Dementia PTA donepezil   Chronic pain PTA gabapentin  Discharge Instructions  Discharge Instructions     [facility] pharmacist to adjust dose for Vancomycin, Aminoglycosides and other anti-infective therapies as requested by physician.   Complete by: As directed    [facility] to provide Cath Flo 86m   Complete by: As directed    Administer for PICC line occlusion and as  ordered by physician for other access device issues.   Anaphylaxis Kit: Provided to treat any anaphylactic reaction to the medication being provided to the patient if First Dose or  when requested by physician   Complete by: As directed    Epinephrine 81m/ml vial / amp: Administer 0.364m(0.69m29msubcutaneously once for moderate to severe anaphylaxis, nurse to call physician and pharmacy when reaction occurs and call 911 if needed for immediate care   Diphenhydramine 73m65m IV vial: Administer 25-73mg21mIM PRN for first dose reaction, rash, itching, mild reaction, nurse to call physician and pharmacy when reaction occurs   Sodium Chloride 0.9% NS 500ml 52mAdminister if needed for hypovolemic blood pressure drop or as ordered by physician after call to physician with anaphylactic reaction   Change dressing on IV access line weekly and PRN   Complete by: As directed    Diet - low sodium heart healthy   Complete by: As directed    Flush IV access with Sodium Chloride 0.9% and Heparin 10 units/ml or 100 units/ml   Complete by: As directed    Home infusion instructions - [facility]   Complete by: As directed    Instructions: Flush IV access with Sodium Chloride 0.9% and Heparin 10units/ml or 100units/ml   Change dressing on IV access line: Weekly and PRN   Instructions Cath Flo 2mg: A469mnister for PICC Line occlusion and as ordered by physician for other access device   [facility] pharmacist to adjust dose for: Vancomycin, Aminoglycosides and other anti-infective therapies as requested by physician   Increase activity slowly   Complete by: As directed    Method of administration may be changed at the discretion of home infusion pharmacist based upon assessment of the patient and/or caregiver's ability to self-administer the medication ordered   Complete by: As directed       Allergies as of 11/28/2021       Reactions   Fentanyl Nausea Only   Oxycodone Nausea And Vomiting        Medication List     STOP taking these medications    cefdinir 300 MG capsule Commonly known as: OMNICEF   tiZANidine 4 MG tablet Commonly known as: ZANAFLEX        TAKE these medications    acetaminophen 325 MG tablet Commonly known as: TYLENOL Take 500 mg by mouth every 6 (six) hours as needed.   aspirin EC 81 MG tablet Take 81 mg by mouth daily.   atorvastatin 40 MG tablet Commonly known as: LIPITOR Take 40 mg by mouth daily.   diclofenac Sodium 1 % Gel Commonly known as: VOLTAREN Apply 2 g topically 2 (two) times daily.   donepezil 5 MG tablet Commonly known as: ARICEPT Take 5 mg by mouth at bedtime.   ertapenem  IVPB Commonly known as: INVANZ Inject 1 g into the vein daily for 4 days. Indication:  ESBL E coli bacteremia First Dose: Yes Last Day of Therapy:  12/01/2021 Labs - Once weekly:  CBC/D and CMP Please pull PIC at completion of IV antibiotics Fax weekly lab results  promptly to [phone] Method of administration: Mini-Bag Plus / Gravity Method of administration may be changed at the discretion of home infusion pharmacist based upon assessment of the patient and/or caregiver's ability to self-administer the medication ordered.   furosemide 40 MG tablet Commonly known as: LASIX Take 1 tablet (40 mg total) by mouth 2 (two) times daily.   gabapentin 300 MG capsule  Commonly known as: NEURONTIN Take 1 capsule (300 mg total) by mouth 2 (two) times daily. What changed: when to take this   lidocaine 5 % Commonly known as: LIDODERM Place 2 patches onto the skin daily.   losartan 25 MG tablet Commonly known as: COZAAR Take 1 tablet (25 mg total) by mouth daily.   mirabegron ER 25 MG Tb24 tablet Commonly known as: MYRBETRIQ Take 25 mg by mouth daily.   omeprazole 20 MG capsule Commonly known as: PRILOSEC Take 1 capsule (20 mg total) by mouth daily.   ondansetron 4 MG disintegrating tablet Commonly known as: ZOFRAN-ODT Take 1 tablet (4 mg total) by mouth every 6 (six) hours as needed for nausea or vomiting.   senna-docusate 8.6-50 MG tablet Commonly known as: Senokot-S Take 1 tablet by mouth 2 (two) times  daily.   spironolactone 25 MG tablet Commonly known as: ALDACTONE Take 25 mg by mouth every morning.   tamsulosin 0.4 MG Caps capsule Commonly known as: FLOMAX Take 0.4 mg by mouth daily.   traZODone 50 MG tablet Commonly known as: DESYREL Take 50-100 mg by mouth at bedtime.   vitamin B-12 1000 MCG tablet Commonly known as: CYANOCOBALAMIN Take 1,000 mcg by mouth daily.               Discharge Care Instructions  (From admission, onward)           Start     Ordered   11/28/21 0000  Change dressing on IV access line weekly and PRN  (Home infusion instructions - [facility] )        11/28/21 1402            Follow-up Information     Dola Argyle, MD. Schedule an appointment as soon as possible for a visit in 1 week(s).   Specialty: Family Medicine               Allergies  Allergen Reactions   Fentanyl Nausea Only   Oxycodone Nausea And Vomiting    Consultations: [facility]   Procedures/Studies: [place] EKG SITE RITE  Result Date: 11/26/2021 If Site Rite image not attached, placement could not be confirmed due to current cardiac rhythm.  DG Chest Port 1 View  Result Date: 11/21/2021 CLINICAL DATA:  Fever, altered mental status EXAM: PORTABLE CHEST 1 VIEW COMPARISON:  09/07/2021 FINDINGS: Cardiomegaly. Unchanged elevation of the right hemidiaphragm. Mild, diffuse interstitial pulmonary opacity. The visualized skeletal structures are unremarkable. IMPRESSION: 1. Cardiomegaly with mild, diffuse interstitial pulmonary opacity, consistent with edema and/or chronic interstitial change. No focal airspace opacity. 2. Unchanged elevation of the right hemidiaphragm. Electronically Signed   By: Delanna Ahmadi M.D.   On: 11/21/2021 16:16   CT ABDOMEN PELVIS W CONTRAST  Result Date: 11/21/2021 CLINICAL DATA:  Pain left lower quadrant of abdomen EXAM: CT ABDOMEN AND PELVIS WITH CONTRAST TECHNIQUE: Multidetector CT imaging of the abdomen and pelvis  was performed using the standard protocol following bolus administration of intravenous contrast. RADIATION DOSE REDUCTION: This exam was performed according to the departmental dose-optimization program which includes automated exposure control, adjustment of the mA and/or kV according to patient size and/or use of iterative reconstruction technique. CONTRAST:  2m OMNIPAQUE IOHEXOL 350 MG/ML SOLN COMPARISON:  09/07/2021 FINDINGS: Lower chest: Coronary artery calcifications are seen. Linear densities in the right lower lung fields may suggest minimal subsegmental atelectasis. Breathing motion limits evaluation of lower lung fields. Hepatobiliary: There is no dilation of bile ducts. There is 3.6 cm  fluid density structure in the left lobe with no significant change. Gallbladder is unremarkable. Pancreas: No focal abnormality is seen. Spleen: Unremarkable. Adrenals/Urinary Tract: Adrenals are unremarkable. There is evidence of previous left nephrectomy. There is no hydronephrosis in the right kidney. There are calcifications in the renal artery branches. No definite renal stones are seen. There is low-density structure in the upper pole of right kidney which is not optimally characterized. Urinary bladder is not distended. Foley catheter is seen in the bladder. Stomach/Bowel: Small hiatal hernia is seen. Small bowel loops are not dilated. Appendix is not seen. There is no significant wall thickening in the colon. There is no pericolic stranding. There is incomplete distention of sigmoid. Vascular/Lymphatic: Extensive atherosclerotic plaques and calcifications are seen in the aorta and its major branches. Reproductive: There is marked enlargement of prostate projecting into the base of the bladder. There are scattered coarse calcifications in the prostate. Other: There is no ascites or pneumoperitoneum. There is diastasis between the rectus muscles in the upper abdomen. Left inguinal hernia containing fat is seen.  Musculoskeletal: Degenerative changes are noted in the lumbar spine with spinal stenosis and encroachment of neural foramina at multiple levels. There is minimal retrolisthesis at L2-L3 level. There is previous laminectomy at L3-L4 level. IMPRESSION: There is no evidence of intestinal obstruction or pneumoperitoneum. Status post left nephrectomy. There is no hydronephrosis in the right kidney. Coronary artery calcifications are seen. Cyst is seen in the liver. Small hiatal hernia. Markedly enlarged prostate. Lumbar spondylosis. Other findings as described in the body of the report. Electronically Signed   By: Elmer Picker M.D.   On: 11/21/2021 15:00      Subjective: Seen and examined on the day of discharge.  Sleepy but otherwise stable.  No distress.  Pain well controlled.  Stable for discharge home.  Discharge Exam: Vitals:   11/28/21 0838 11/28/21 1210  BP: 127/79 104/66  Pulse: 68 82  Resp: 20 17  Temp: 97.9 F (36.6 C) 97.9 F (36.6 C)  SpO2: 94% 99%   Vitals:   11/28/21 0000 11/28/21 0358 11/28/21 0838 11/28/21 1210  BP: 121/71 111/69 127/79 104/66  Pulse: 70 62 68 82  Resp: _0 Temp: 98.8 F (37.1 C) 97.9 F (36.6 C) 97.9 F (36.6 C) 97.9 F (36.6 C)  TempSrc:   Oral Oral  SpO2: 95% 95% 94% 99%  Weight:      Height:        General: Pt is alert, awake, not in acute distress Cardiovascular: RRR, S1/S2 +, no rubs, no gallops Respiratory: CTA bilaterally, no wheezing, no rhonchi Abdominal: Soft, NT, ND, bowel sounds + Extremities: no edema, no cyanosis    The results of significant diagnostics from this hospitalization (including imaging, microbiology, ancillary and laboratory) are listed below for reference.     Microbiology: Recent Results (from the past 240 hour(s))  SARS Coronavirus 2 by RT PCR (hospital order, performed in [facility] hospital lab) *cepheid single result test* Anterior Nasal Swab     Status: None   Collection Time: 11/21/21   4:40 PM   Specimen: Anterior Nasal Swab  Result Value Ref Range Status   SARS Coronavirus 2 by RT PCR NEGATIVE NEGATIVE Final    Comment: (NOTE) SARS-CoV-2 target nucleic acids are NOT DETECTED.  The SARS-CoV-2 RNA is generally detectable in upper and lower respiratory specimens during the acute phase of infection. The lowest concentration of SARS-CoV-2 viral copies this assay can detect is 250 copies /  mL. A negative result does not preclude SARS-CoV-2 infection and should not be used as the sole basis for treatment or other patient management decisions.  A negative result may occur with improper specimen collection / handling, submission of specimen other than nasopharyngeal swab, presence of viral mutation(s) within the areas targeted by this assay, and inadequate number of viral copies (<250 copies / mL). A negative result must be combined with clinical observations, patient history, and epidemiological information.  Fact Sheet for Patients:   https://www.patel.info/  Fact Sheet for Healthcare Providers: https://hall.com/  This test is not yet approved or  cleared by the [place] FDA and has been authorized for detection and/or diagnosis of SARS-CoV-2 by FDA under an Emergency Use Authorization (EUA).  This EUA will remain in effect (meaning this test can be used) for the duration of the COVID-19 declaration under Section 564(b)(1) of the Act, 21 U.S.C. section 360bbb-3(b)(1), unless the authorization is terminated or revoked sooner.  Performed at [facility], [address] [place], [place] [address]   Culture, blood (Routine X 2) w Reflex to ID Panel     Status: Abnormal   Collection Time: 11/21/21  5:14 PM   Specimen: BLOOD  Result Value Ref Range Status   Specimen Description   Final    BLOOD RW Performed at [facility], [address], [place], [place] [address]    Special Requests   Final     BOTTLES DRAWN AEROBIC AND ANAEROBIC BCAV Performed at [facility], [address], [place], [place] [address]    Culture  Setup Time   Final    ANAEROBIC BOTTLE ONLY GRAM NEGATIVE RODS CRITICAL RESULT CALLED TO, READ BACK BY AND VERIFIED WITH: DEVAN MITCHELL _0  11/22/21 MJU    Culture (A)  Final    ESCHERICHIA COLI Confirmed Extended Spectrum Beta-Lactamase Producer (ESBL).  In bloodstream infections from ESBL organisms, carbapenems are preferred over piperacillin/tazobactam. They are shown to have a lower risk of mortality.    Report Status 11/25/2021 FINAL  Final   Organism ID, Bacteria ESCHERICHIA COLI  Final      Susceptibility   Escherichia coli - MIC*    AMPICILLIN >=32 RESISTANT Resistant     CEFAZOLIN >=64 RESISTANT Resistant     CEFTAZIDIME RESISTANT Resistant     CEFTRIAXONE >=64 RESISTANT Resistant     CIPROFLOXACIN >=4 RESISTANT Resistant     GENTAMICIN >=16 RESISTANT Resistant     IMIPENEM <=0.25 SENSITIVE Sensitive     TRIMETH/SULFA <=20 SENSITIVE Sensitive     AMPICILLIN/SULBACTAM >=32 RESISTANT Resistant     * ESCHERICHIA COLI  Blood Culture ID Panel (Reflexed)     Status: Abnormal   Collection Time: 11/21/21  5:14 PM  Result Value Ref Range Status   Enterococcus faecalis NOT DETECTED NOT DETECTED Final   Enterococcus Faecium NOT DETECTED NOT DETECTED Final   Listeria monocytogenes NOT DETECTED NOT DETECTED Final   Staphylococcus species NOT DETECTED NOT DETECTED Final   Staphylococcus aureus (BCID) NOT DETECTED NOT DETECTED Final   Staphylococcus epidermidis NOT DETECTED NOT DETECTED Final   Staphylococcus lugdunensis NOT DETECTED NOT DETECTED Final   Streptococcus species NOT DETECTED NOT DETECTED Final   Streptococcus agalactiae NOT DETECTED NOT DETECTED Final   Streptococcus pneumoniae NOT DETECTED NOT DETECTED Final   Streptococcus pyogenes NOT DETECTED NOT DETECTED Final   A.calcoaceticus-baumannii NOT DETECTED NOT DETECTED Final    Bacteroides fragilis NOT DETECTED NOT DETECTED Final   Enterobacterales DETECTED (A) NOT DETECTED  Final    Comment: Enterobacterales represent a large order of gram negative bacteria, not a single organism. CRITICAL RESULT CALLED TO, READ BACK BY AND VERIFIED WITH: DEVAN MITCHELL _0  11/22/21 MJU    Enterobacter cloacae complex NOT DETECTED NOT DETECTED Final   Escherichia coli DETECTED (A) NOT DETECTED Final    Comment: CRITICAL RESULT CALLED TO, READ BACK BY AND VERIFIED WITH: DEVAN MITCHELL _1  11/22/21 MJU    Klebsiella aerogenes NOT DETECTED NOT DETECTED Final   Klebsiella oxytoca NOT DETECTED NOT DETECTED Final   Klebsiella pneumoniae NOT DETECTED NOT DETECTED Final   Proteus species NOT DETECTED NOT DETECTED Final   Salmonella species NOT DETECTED NOT DETECTED Final   Serratia marcescens NOT DETECTED NOT DETECTED Final   Haemophilus influenzae NOT DETECTED NOT DETECTED Final   Neisseria meningitidis NOT DETECTED NOT DETECTED Final   Pseudomonas aeruginosa NOT DETECTED NOT DETECTED Final   Stenotrophomonas maltophilia NOT DETECTED NOT DETECTED Final   Candida albicans NOT DETECTED NOT DETECTED Final   Candida auris NOT DETECTED NOT DETECTED Final   Candida glabrata NOT DETECTED NOT DETECTED Final   Candida krusei NOT DETECTED NOT DETECTED Final   Candida parapsilosis NOT DETECTED NOT DETECTED Final   Candida tropicalis NOT DETECTED NOT DETECTED Final   Cryptococcus neoformans/gattii NOT DETECTED NOT DETECTED Final   CTX-M ESBL DETECTED (A) NOT DETECTED Final    Comment: CRITICAL RESULT CALLED TO, READ BACK BY AND VERIFIED WITH: DEVAN MITCHELL _2  11/22/21 MJU (NOTE) Extended spectrum beta-lactamase detected. Recommend a carbapenem as initial therapy.      Carbapenem resistance IMP NOT DETECTED NOT DETECTED Final   Carbapenem resistance KPC NOT DETECTED NOT DETECTED Final   Carbapenem resistance NDM NOT DETECTED NOT DETECTED Final   Carbapenem resist OXA 48 LIKE NOT  DETECTED NOT DETECTED Final   Carbapenem resistance VIM NOT DETECTED NOT DETECTED Final    Comment: Performed at [facility], [address] [place], [place] [address]  Culture, blood (Routine X 2) w Reflex to ID Panel     Status: None   Collection Time: 11/21/21  5:23 PM   Specimen: BLOOD  Result Value Ref Range Status   Specimen Description BLOOD [facility]  Final   Special Requests BOTTLES DRAWN AEROBIC AND ANAEROBIC BCAV  Final   Culture   Final    NO GROWTH 5 DAYS Performed at [facility], [address], [place], [place] [address]    Report Status 11/26/2021 FINAL  Final  Culture, blood (Routine X 2) w Reflex to ID Panel     Status: None (Preliminary result)   Collection Time: 11/25/21 10:54 AM   Specimen: BLOOD  Result Value Ref Range Status   Specimen Description BLOOD RIGHT ANTECUBITAL  Final   Special Requests   Final    BOTTLES DRAWN AEROBIC AND ANAEROBIC Blood Culture adequate volume   Culture   Final    NO GROWTH 3 DAYS Performed at [facility], [address] [place], [place] [address]    Report Status PENDING  Incomplete  Culture, blood (Routine X 2) w Reflex to ID Panel     Status: None (Preliminary result)   Collection Time: 11/25/21 11:02 AM   Specimen: BLOOD  Result Value Ref Range Status   Specimen Description BLOOD BLOOD LEFT HAND  Final   Special Requests   Final    BOTTLES DRAWN AEROBIC AND ANAEROBIC Blood Culture adequate volume   Culture   Final    NO GROWTH 3  DAYS Performed at [facility], [address] [place], [place] [address]    Report Status PENDING  Incomplete     Labs: BNP (last 3 results) Recent Labs    05/14/21 1710 09/08/21 0110  BNP 56.3 48.2   Basic Metabolic Panel: Recent Labs  Lab 11/22/21 0623 11/23/21 0529 11/24/21 0537 11/25/21 0626 11/27/21 0823  NA 132* 134* 136 138 136  K 4.5 4.2 4.0 3.9 3.9  CL 104 106 107 109 108  CO2 _0 GLUCOSE 112* 101* 113* 102* 93  BUN  18 21 29* 26* 18  CREATININE 1.07 1.10 1.03 0.97 0.81  CALCIUM 8.6* 8.4* 8.6* 8.3* 8.2*   Liver Function Tests: No results for input(s): "AST", "ALT", "ALKPHOS", "BILITOT", "PROT", "ALBUMIN" in the last 168 hours. No results for input(s): "LIPASE", "AMYLASE" in the last 168 hours. No results for input(s): "AMMONIA" in the last 168 hours. CBC: Recent Labs  Lab 11/22/21 0623 11/23/21 0529 11/24/21 0537 11/25/21 0626 11/27/21 0823  WBC 9.7 7.0 6.7 7.3 8.7  HGB 11.5* 11.3* 11.5* 10.8* 10.3*  HCT 35.8* 35.3* 36.2* 34.4* 32.3*  MCV 86.9 86.7 87.9 88.7 86.6  PLT 178 159 183 187 218   Cardiac Enzymes: No results for input(s): "CKTOTAL", "CKMB", "CKMBINDEX", "TROPONINI" in the last 168 hours. BNP: Invalid input(s): "POCBNP" CBG: Recent Labs  Lab 11/23/21 1643  GLUCAP 117*   D-Dimer No results for input(s): "DDIMER" in the last 72 hours. Hgb A1c No results for input(s): "HGBA1C" in the last 72 hours. Lipid Profile No results for input(s): "CHOL", "HDL", "LDLCALC", "TRIG", "CHOLHDL", "LDLDIRECT" in the last 72 hours. Thyroid function studies No results for input(s): "TSH", "T4TOTAL", "T3FREE", "THYROIDAB" in the last 72 hours.  Invalid input(s): "FREET3" Anemia work up No results for input(s): "VITAMINB12", "FOLATE", "FERRITIN", "TIBC", "IRON", "RETICCTPCT" in the last 72 hours. Urinalysis    Component Value Date/Time   COLORURINE YELLOW (A) 11/15/2021 0114   APPEARANCEUR HAZY (A) 11/15/2021 0114   LABSPEC 1.015 11/15/2021 0114   PHURINE 5.0 11/15/2021 0114   GLUCOSEU NEGATIVE 11/15/2021 0114   HGBUR LARGE (A) 11/15/2021 0114   BILIRUBINUR NEGATIVE 11/15/2021 0114   KETONESUR NEGATIVE 11/15/2021 0114   PROTEINUR 30 (A) 11/15/2021 0114   NITRITE NEGATIVE 11/15/2021 0114   LEUKOCYTESUR LARGE (A) 11/15/2021 0114   Sepsis Labs Recent Labs  Lab 11/23/21 0529 11/24/21 0537 11/25/21 0626 11/27/21 0823  WBC 7.0 6.7 7.3 8.7   Microbiology Recent Results (from the past  240 hour(s))  SARS Coronavirus 2 by RT PCR (hospital order, performed in [place] hospital lab) *cepheid single result test* Anterior Nasal Swab     Status: None   Collection Time: 11/21/21  4:40 PM   Specimen: Anterior Nasal Swab  Result Value Ref Range Status   SARS Coronavirus 2 by RT PCR NEGATIVE NEGATIVE Final    Comment: (NOTE) SARS-CoV-2 target nucleic acids are NOT DETECTED.  The SARS-CoV-2 RNA is generally detectable in upper and lower respiratory specimens during the acute phase of infection. The lowest concentration of SARS-CoV-2 viral copies this assay can detect is 250 copies / mL. A negative result does not preclude SARS-CoV-2 infection and should not be used as the sole basis for treatment or other patient management decisions.  A negative result may occur with improper specimen collection / handling, submission of specimen other than nasopharyngeal swab, presence of viral mutation(s) within the areas targeted by this assay, and inadequate number of viral copies (<250 copies /  mL). A negative result must be combined with clinical observations, patient history, and epidemiological information.  Fact Sheet for Patients:   https://www.patel.info/  Fact Sheet for Healthcare Providers: https://hall.com/  This test is not yet approved or  cleared by the [place] FDA and has been authorized for detection and/or diagnosis of SARS-CoV-2 by FDA under an Emergency Use Authorization (EUA).  This EUA will remain in effect (meaning this test can be used) for the duration of the COVID-19 declaration under Section 564(b)(1) of the Act, 21 U.S.C. section 360bbb-3(b)(1), unless the authorization is terminated or revoked sooner.  Performed at [facility], [address] [place], [place] [address]   Culture, blood (Routine X 2) w Reflex to ID Panel     Status: Abnormal   Collection Time: 11/21/21  5:14 PM   Specimen:  BLOOD  Result Value Ref Range Status   Specimen Description   Final    BLOOD RW Performed at [facility], [address], [place], [place] [address]    Special Requests   Final    BOTTLES DRAWN AEROBIC AND ANAEROBIC BCAV Performed at [facility], [address], [place], [place] [address]    Culture  Setup Time   Final    ANAEROBIC BOTTLE ONLY GRAM NEGATIVE RODS CRITICAL RESULT CALLED TO, READ BACK BY AND VERIFIED WITH: DEVAN MITCHELL _0  11/22/21 MJU    Culture (A)  Final    ESCHERICHIA COLI Confirmed Extended Spectrum Beta-Lactamase Producer (ESBL).  In bloodstream infections from ESBL organisms, carbapenems are preferred over piperacillin/tazobactam. They are shown to have a lower risk of mortality.    Report Status 11/25/2021 FINAL  Final   Organism ID, Bacteria ESCHERICHIA COLI  Final      Susceptibility   Escherichia coli - MIC*    AMPICILLIN >=32 RESISTANT Resistant     CEFAZOLIN >=64 RESISTANT Resistant     CEFTAZIDIME RESISTANT Resistant     CEFTRIAXONE >=64 RESISTANT Resistant     CIPROFLOXACIN >=4 RESISTANT Resistant     GENTAMICIN >=16 RESISTANT Resistant     IMIPENEM <=0.25 SENSITIVE Sensitive     TRIMETH/SULFA <=20 SENSITIVE Sensitive     AMPICILLIN/SULBACTAM >=32 RESISTANT Resistant     * ESCHERICHIA COLI  Blood Culture ID Panel (Reflexed)     Status: Abnormal   Collection Time: 11/21/21  5:14 PM  Result Value Ref Range Status   Enterococcus faecalis NOT DETECTED NOT DETECTED Final   Enterococcus Faecium NOT DETECTED NOT DETECTED Final   Listeria monocytogenes NOT DETECTED NOT DETECTED Final   Staphylococcus species NOT DETECTED NOT DETECTED Final   Staphylococcus aureus (BCID) NOT DETECTED NOT DETECTED Final   Staphylococcus epidermidis NOT DETECTED NOT DETECTED Final   Staphylococcus lugdunensis NOT DETECTED NOT DETECTED Final   Streptococcus species NOT DETECTED NOT DETECTED Final   Streptococcus agalactiae NOT DETECTED NOT DETECTED  Final   Streptococcus pneumoniae NOT DETECTED NOT DETECTED Final   Streptococcus pyogenes NOT DETECTED NOT DETECTED Final   A.calcoaceticus-baumannii NOT DETECTED NOT DETECTED Final   Bacteroides fragilis NOT DETECTED NOT DETECTED Final   Enterobacterales DETECTED (A) NOT DETECTED Final    Comment: Enterobacterales represent a large order of gram negative bacteria, not a single organism. CRITICAL RESULT CALLED TO, READ BACK BY AND VERIFIED WITH: DEVAN MITCHELL _1  11/22/21 MJU    Enterobacter cloacae complex NOT DETECTED NOT DETECTED Final   Escherichia coli DETECTED (A) NOT DETECTED Final    Comment: CRITICAL RESULT CALLED TO, READ BACK BY  AND VERIFIED WITH: DEVAN MITCHELL _0  11/22/21 MJU    Klebsiella aerogenes NOT DETECTED NOT DETECTED Final   Klebsiella oxytoca NOT DETECTED NOT DETECTED Final   Klebsiella pneumoniae NOT DETECTED NOT DETECTED Final   Proteus species NOT DETECTED NOT DETECTED Final   Salmonella species NOT DETECTED NOT DETECTED Final   Serratia marcescens NOT DETECTED NOT DETECTED Final   Haemophilus influenzae NOT DETECTED NOT DETECTED Final   Neisseria meningitidis NOT DETECTED NOT DETECTED Final   Pseudomonas aeruginosa NOT DETECTED NOT DETECTED Final   Stenotrophomonas maltophilia NOT DETECTED NOT DETECTED Final   Candida albicans NOT DETECTED NOT DETECTED Final   Candida auris NOT DETECTED NOT DETECTED Final   Candida glabrata NOT DETECTED NOT DETECTED Final   Candida krusei NOT DETECTED NOT DETECTED Final   Candida parapsilosis NOT DETECTED NOT DETECTED Final   Candida tropicalis NOT DETECTED NOT DETECTED Final   Cryptococcus neoformans/gattii NOT DETECTED NOT DETECTED Final   CTX-M ESBL DETECTED (A) NOT DETECTED Final    Comment: CRITICAL RESULT CALLED TO, READ BACK BY AND VERIFIED WITH: DEVAN MITCHELL _1  11/22/21 MJU (NOTE) Extended spectrum beta-lactamase detected. Recommend a carbapenem as initial therapy.      Carbapenem resistance IMP NOT  DETECTED NOT DETECTED Final   Carbapenem resistance KPC NOT DETECTED NOT DETECTED Final   Carbapenem resistance NDM NOT DETECTED NOT DETECTED Final   Carbapenem resist OXA 48 LIKE NOT DETECTED NOT DETECTED Final   Carbapenem resistance VIM NOT DETECTED NOT DETECTED Final    Comment: Performed at [facility], [address] [place], [place] [address]  Culture, blood (Routine X 2) w Reflex to ID Panel     Status: None   Collection Time: 11/21/21  5:23 PM   Specimen: BLOOD  Result Value Ref Range Status   Specimen Description BLOOD [facility]  Final   Special Requests BOTTLES DRAWN AEROBIC AND ANAEROBIC BCAV  Final   Culture   Final    NO GROWTH 5 DAYS Performed at [facility], [address] [place], [place] [address]    Report Status 11/26/2021 FINAL  Final  Culture, blood (Routine X 2) w Reflex to ID Panel     Status: None (Preliminary result)   Collection Time: 11/25/21 10:54 AM   Specimen: BLOOD  Result Value Ref Range Status   Specimen Description BLOOD RIGHT ANTECUBITAL  Final   Special Requests   Final    BOTTLES DRAWN AEROBIC AND ANAEROBIC Blood Culture adequate volume   Culture   Final    NO GROWTH 3 DAYS Performed at [facility], [address], [place], [place] [address]    Report Status PENDING  Incomplete  Culture, blood (Routine X 2) w Reflex to ID Panel     Status: None (Preliminary result)   Collection Time: 11/25/21 11:02 AM   Specimen: BLOOD  Result Value Ref Range Status   Specimen Description BLOOD BLOOD LEFT HAND  Final   Special Requests   Final    BOTTLES DRAWN AEROBIC AND ANAEROBIC Blood Culture adequate volume   Culture   Final    NO GROWTH 3 DAYS Performed at [facility], [address] [place], [place] [address]    Report Status PENDING  Incomplete     Time coordinating discharge: Over 30 minutes  SIGNED:   Sidney Ace, MD  [facility] 11/28/2021, 2:23 PM Pager   If 7PM-7AM, please  contact night-coverage

## 2021-11-28 NOTE — Plan of Care (Signed)
  Problem: Education: Goal: Knowledge of General Education information will improve Description: Including pain rating scale, medication(s)/side effects and non-pharmacologic comfort measures Outcome: Adequate for Discharge   Problem: Health Behavior/Discharge Planning: Goal: Ability to manage health-related needs will improve Outcome: Adequate for Discharge   Problem: Clinical Measurements: Goal: Ability to maintain clinical measurements within normal limits will improve Outcome: Adequate for Discharge Goal: Will remain free from infection Outcome: Adequate for Discharge Goal: Diagnostic test results will improve Outcome: Adequate for Discharge Goal: Respiratory complications will improve Outcome: Adequate for Discharge Goal: Cardiovascular complication will be avoided Outcome: Adequate for Discharge   Problem: Activity: Goal: Risk for activity intolerance will decrease Outcome: Adequate for Discharge   Problem: Nutrition: Goal: Adequate nutrition will be maintained Outcome: Adequate for Discharge   Problem: Coping: Goal: Level of anxiety will decrease Outcome: Adequate for Discharge   Problem: Elimination: Goal: Will not experience complications related to bowel motility Outcome: Adequate for Discharge Goal: Will not experience complications related to urinary retention Outcome: Adequate for Discharge   Problem: Pain Managment: Goal: General experience of comfort will improve Outcome: Adequate for Discharge   Problem: Safety: Goal: Ability to remain free from injury will improve Outcome: Adequate for Discharge   Problem: Skin Integrity: Goal: Risk for impaired skin integrity will decrease Outcome: Adequate for Discharge   Problem: Acute Rehab OT Goals (only OT should resolve) Goal: Pt. Will Perform Grooming Outcome: Adequate for Discharge Goal: Pt. Will Perform Lower Body Dressing Outcome: Adequate for Discharge Goal: Pt. Will Transfer To Toilet Outcome:  Adequate for Discharge Goal: Pt. Will Perform Toileting-Clothing Manipulation Outcome: Adequate for Discharge   Problem: Acute Rehab PT Goals(only PT should resolve) Goal: Pt Will Go Supine/Side To Sit Outcome: Adequate for Discharge Goal: Pt Will Go Sit To Supine/Side Outcome: Adequate for Discharge Goal: Pt Will Transfer Bed To Chair/Chair To Bed Outcome: Adequate for Discharge Goal: Pt Will Ambulate Outcome: Adequate for Discharge Goal: Pt Will Go Up/Down Stairs Outcome: Adequate for Discharge

## 2021-11-30 LAB — CULTURE, BLOOD (ROUTINE X 2)
Culture: NO GROWTH
Culture: NO GROWTH
Special Requests: ADEQUATE
Special Requests: ADEQUATE

## 2021-12-01 ENCOUNTER — Telehealth: Payer: Self-pay

## 2021-12-01 NOTE — Telephone Encounter (Signed)
Received call today from [place], Rn with Home health stating patient will be finishing Iv antibiotics today. Wanted to confirm if patient had a picc line or midline. Per chart patient had midline placed as hospital. Leatrice Jewels, [place]

## 2021-12-05 ENCOUNTER — Telehealth: Payer: Self-pay | Admitting: Pharmacist

## 2021-12-05 NOTE — Telephone Encounter (Signed)
Margaret from [place] [phone]) called requesting advice on patient's PICC line. Completing antibiotics on 7/3. Concerned that the line was never flushed between doses and unable to draw labs today. Stated they could draw peripherally or defer labs until his PCP follow-up. Will remove PICC today.   Alfonse Spruce, PharmD, CPP Clinical Pharmacist Practitioner [facility] [place] [facility]

## 2022-01-01 ENCOUNTER — Emergency Department: Payer: Medicare Other

## 2022-01-01 ENCOUNTER — Other Ambulatory Visit: Payer: Self-pay

## 2022-01-01 ENCOUNTER — Emergency Department
Admission: EM | Admit: 2022-01-01 | Discharge: 2022-01-01 | Disposition: A | Discharge status: Home / Self Care | Payer: Medicare Other | Attending: Emergency Medicine | Admitting: Emergency Medicine

## 2022-01-01 DIAGNOSIS — I129 Hypertensive chronic kidney disease with stage 1 through stage 4 chronic kidney disease, or unspecified chronic kidney disease: Secondary | ICD-10-CM | POA: Diagnosis not present

## 2022-01-01 DIAGNOSIS — T83098A Other mechanical complication of other indwelling urethral catheter, initial encounter: Secondary | ICD-10-CM | POA: Insufficient documentation

## 2022-01-01 DIAGNOSIS — N189 Chronic kidney disease, unspecified: Secondary | ICD-10-CM | POA: Insufficient documentation

## 2022-01-01 DIAGNOSIS — Y732 Prosthetic and other implants, materials and accessory gastroenterology and urology devices associated with adverse incidents: Secondary | ICD-10-CM | POA: Insufficient documentation

## 2022-01-01 DIAGNOSIS — N390 Urinary tract infection, site not specified: Secondary | ICD-10-CM | POA: Diagnosis not present

## 2022-01-01 DIAGNOSIS — R600 Localized edema: Secondary | ICD-10-CM | POA: Diagnosis not present

## 2022-01-01 LAB — CBC WITH DIFFERENTIAL/PLATELET
Abs Immature Granulocytes: 0.03 10*3/uL (ref 0.00–0.07)
Basophils Absolute: 0 10*3/uL (ref 0.0–0.1)
Basophils Relative: 0 %
Eosinophils Absolute: 0.2 10*3/uL (ref 0.0–0.5)
Eosinophils Relative: 2 %
HCT: 46.7 % (ref 39.0–52.0)
Hemoglobin: 13.6 g/dL (ref 13.0–17.0)
Immature Granulocytes: 0 %
Lymphocytes Relative: 24 %
Lymphs Abs: 2.2 10*3/uL (ref 0.7–4.0)
MCH: 27.6 pg (ref 26.0–34.0)
MCHC: 29.1 g/dL — ABNORMAL LOW (ref 30.0–36.0)
MCV: 94.9 fL (ref 80.0–100.0)
Monocytes Absolute: 0.9 10*3/uL (ref 0.1–1.0)
Monocytes Relative: 10 %
Neutro Abs: 5.9 10*3/uL (ref 1.7–7.7)
Neutrophils Relative %: 64 %
Platelets: 229 10*3/uL (ref 150–400)
RBC: 4.92 MIL/uL (ref 4.22–5.81)
RDW: 14.1 % (ref 11.5–15.5)
WBC: 9.3 10*3/uL (ref 4.0–10.5)
nRBC: 0 % (ref 0.0–0.2)

## 2022-01-01 LAB — URINALYSIS, ROUTINE W REFLEX MICROSCOPIC
Bilirubin Urine: NEGATIVE
Glucose, UA: NEGATIVE mg/dL
Ketones, ur: NEGATIVE mg/dL
Nitrite: POSITIVE — AB
Protein, ur: 100 mg/dL — AB
RBC / HPF: >50 RBC/hpf — ABNORMAL HIGH (ref 0–5)
Specific Gravity, Urine: 1.021 (ref 1.005–1.030)
WBC, UA: >50 WBC/hpf — ABNORMAL HIGH (ref 0–5)
pH: 5 (ref 5.0–8.0)

## 2022-01-01 LAB — COMPREHENSIVE METABOLIC PANEL
ALT: 10 U/L (ref 0–44)
AST: 18 U/L (ref 15–41)
Albumin: 3.8 g/dL (ref 3.5–5.0)
Alkaline Phosphatase: 84 U/L (ref 38–126)
Anion gap: 11 (ref 5–15)
BUN: 20 mg/dL (ref 8–23)
CO2: 19 mmol/L — ABNORMAL LOW (ref 22–32)
Calcium: 9 mg/dL (ref 8.9–10.3)
Chloride: 105 mmol/L (ref 98–111)
Creatinine, Ser: 1.44 mg/dL — ABNORMAL HIGH (ref 0.61–1.24)
GFR, Estimated: 49 mL/min — ABNORMAL LOW (ref >60)
Glucose, Bld: 88 mg/dL (ref 70–99)
Potassium: 4.7 mmol/L (ref 3.5–5.1)
Sodium: 135 mmol/L (ref 135–145)
Total Bilirubin: 0.7 mg/dL (ref 0.3–1.2)
Total Protein: 7.6 g/dL (ref 6.5–8.1)

## 2022-01-01 LAB — BRAIN NATRIURETIC PEPTIDE: B Natriuretic Peptide: 71.9 pg/mL (ref 0.0–100.0)

## 2022-01-01 MED ORDER — SODIUM CHLORIDE 0.9 % IV SOLN
1.0000 g | Freq: Once | INTRAVENOUS | Status: AC
  Administered 2022-01-01: 1 g via INTRAVENOUS
  Filled 2022-01-01: qty 10

## 2022-01-01 MED ORDER — CEFDINIR 300 MG PO CAPS
300.0000 mg | ORAL_CAPSULE | Freq: Two times a day (BID) | ORAL | 0 refills | Status: DC
Start: 2022-01-01 — End: 2022-02-14

## 2022-01-01 NOTE — ED Notes (Signed)
25 yom with a c/c of a possible blockage in his foley cath. The pt was warm, pink, and dry. The pt is a two person assist to the bed. Visible swelling to both of his feet.

## 2022-01-01 NOTE — ED Triage Notes (Addendum)
Pt c/o low back pain and possible catheter blockage starting this morning.  Pain score  10/10.  Sediment noted in drainage bag.  Pt believes foley was changed x1 month ago.   Pt noted to have swelling in bilateral feet.  Pt reports this is normal for him.  Denies new swelling.

## 2022-01-01 NOTE — ED Provider Notes (Signed)
[facility] Provider Note    Event Date/Time   First MD Initiated Contact with Patient 01/01/22 1613     (approximate)   History   Chief Complaint: Foley Catheter Issue   HPI  Adam Rios. is a 81 y.o. male with a history of CKD, GERD, stroke, hypertension, chronic indwelling Foley catheter who comes the ED complaining of suprapubic pain and low back pain and feeling like his Foley catheter is blocked.  I reviewed outside records it appears the Foley catheter was changed by home health 2 weeks ago.  Patient denies any chest pain shortness of breath or fevers.  He has chronic bilateral lower extremity swelling which she feels is at baseline.  No other acute complaints.     Physical Exam   Triage Vital Signs: ED Triage Vitals  Enc Vitals Group     BP 01/01/22 1557 126/88     Pulse Rate 01/01/22 1557 (!) 104     Resp 01/01/22 1557 (!) 24     Temp 01/01/22 1557 97.7 F (36.5 C)     Temp Source 01/01/22 1557 Oral     SpO2 01/01/22 1557 96 %     Weight 01/01/22 1558 212 lb (96.2 kg)     Height 01/01/22 1558 '6\' 1"'$  (1.854 m)     Head Circumference --      Peak Flow --      Pain Score 01/01/22 1558 10     Pain Loc --      Pain Edu? --      Excl. in [place]? --     Most recent vital signs: Vitals:   01/01/22 1557  BP: 126/88  Pulse: (!) 104  Resp: (!) 24  Temp: 97.7 F (36.5 C)  SpO2: 96%    General: Awake, no distress.  CV:  Good peripheral perfusion.  Regular rate and rhythm, heart rate of 80 Resp:  Normal effort.  Clear to auscultation bilaterally Abd:  No distention.  Soft with suprapubic tenderness.  No CVA tenderness Other:  1+ pitting edema bilateral lower extremities, symmetric.   ED Results / Procedures / Treatments   Labs (all labs ordered are listed, but only abnormal results are displayed) Labs Reviewed  COMPREHENSIVE METABOLIC PANEL - Abnormal; Notable for the following components:      Result Value   CO2 19 (*)     Creatinine, Ser 1.44 (*)    GFR, Estimated 49 (*)    All other components within normal limits  CBC WITH DIFFERENTIAL/PLATELET - Abnormal; Notable for the following components:   MCHC 29.1 (*)    All other components within normal limits  URINALYSIS, ROUTINE W REFLEX MICROSCOPIC - Abnormal; Notable for the following components:   Color, Urine YELLOW (*)    APPearance CLOUDY (*)    Hgb urine dipstick LARGE (*)    Protein, ur 100 (*)    Nitrite POSITIVE (*)    Leukocytes,Ua LARGE (*)    RBC / HPF >50 (*)    WBC, UA >50 (*)    Bacteria, UA MANY (*)    All other components within normal limits  URINE CULTURE  BRAIN NATRIURETIC PEPTIDE     EKG    RADIOLOGY X-ray interpreted by me, appears normal, no pulmonary edema or pleural effusion.  Radiology report reviewed.   PROCEDURES:  Procedures   MEDICATIONS ORDERED IN ED: Medications  cefTRIAXone (ROCEPHIN) 1 g in sodium chloride 0.9 % 100 mL IVPB (1 g Intravenous  New Bag/Given 01/01/22 1730)     IMPRESSION / MDM / ASSESSMENT AND PLAN / ED COURSE  I reviewed the triage vital signs and the nursing notes.                              Differential diagnosis includes, but is not limited to, Foley catheter obstruction, Foley catheter displacement, cystitis, AKI, electrolyte abnormality  Patient's presentation is most consistent with acute presentation with potential threat to life or bodily function.  Patient presents with suprapubic pain and poor drainage from his Foley catheter.  Inspecting the Foley catheter shows heavy colonization and sediment in the tubing and very dark urine in the collection bag.  Labs show mild increase of his creatinine.  Electrolytes are normal, no acidosis.  Urinalysis shows nitrate positive UTI.  Patient given IV Rocephin in the ED, will continue Omnicef at home.  His Foley catheter was changed to a new catheter with only 150 mL of residual drainage.  Afterward, bladder scan showed a volume of 0 mL.[name]   He is not septic, no pyelonephritis or obstructive renal failure.       FINAL CLINICAL IMPRESSION(S) / ED DIAGNOSES   Final diagnoses:  Urinary tract infection associated with indwelling urethral catheter, initial encounter ([place])     Rx / DC Orders   ED Discharge Orders          Ordered    cefdinir (OMNICEF) 300 MG capsule  2 times daily        01/01/22 1805             Note:  This document was prepared using Dragon voice recognition software and may include unintentional dictation errors.   Carrie Mew, MD 01/01/22 [phone]

## 2022-01-01 NOTE — ED Notes (Signed)
First Nurse Note: Patient to ED via [facility] from home for blood in urine and back pain that started this AM. Patient has foley bag in place. Aox4   132/92 98 HR 93%

## 2022-01-01 NOTE — Discharge Instructions (Signed)
We changed your foley catheter to a new clean one today.  Your urine test shows signs of infection, so we gave you IV antibiotics and have sent a prescription to your pharmacy to continue antibiotics for the next week.

## 2022-01-01 NOTE — ED Notes (Signed)
This RN to bedside to change out foley catheter, and draw blood/start IV. This RN noted green/yellow, mucusy, malodorous drainage from penis, around catheter tubing. Current foley cath is a 44F coude. Pt's current foley catheter removed. Replaced with 68F catheter, urine drained immediately, 174m orange urine with sediment and clots.

## 2022-01-02 LAB — URINE CULTURE

## 2022-02-11 ENCOUNTER — Emergency Department: Payer: Medicare Other

## 2022-02-11 ENCOUNTER — Other Ambulatory Visit: Payer: Self-pay

## 2022-02-11 ENCOUNTER — Inpatient Hospital Stay
Admission: EM | Admit: 2022-02-11 | Discharge: 2022-02-14 | DRG: 698 | Disposition: A | Discharge status: Home Health | Payer: Medicare Other | Attending: Hospitalist | Admitting: Hospitalist

## 2022-02-11 DIAGNOSIS — T83518A Infection and inflammatory reaction due to other urinary catheter, initial encounter: Principal | ICD-10-CM | POA: Diagnosis present

## 2022-02-11 DIAGNOSIS — Z1624 Resistance to multiple antibiotics: Secondary | ICD-10-CM | POA: Diagnosis present

## 2022-02-11 DIAGNOSIS — Z9841 Cataract extraction status, right eye: Secondary | ICD-10-CM

## 2022-02-11 DIAGNOSIS — K219 Gastro-esophageal reflux disease without esophagitis: Secondary | ICD-10-CM | POA: Diagnosis present

## 2022-02-11 DIAGNOSIS — I5032 Chronic diastolic (congestive) heart failure: Secondary | ICD-10-CM | POA: Diagnosis not present

## 2022-02-11 DIAGNOSIS — L8990 Pressure ulcer of unspecified site, unspecified stage: Secondary | ICD-10-CM | POA: Insufficient documentation

## 2022-02-11 DIAGNOSIS — N182 Chronic kidney disease, stage 2 (mild): Secondary | ICD-10-CM | POA: Diagnosis not present

## 2022-02-11 DIAGNOSIS — Z8673 Personal history of transient ischemic attack (TIA), and cerebral infarction without residual deficits: Secondary | ICD-10-CM

## 2022-02-11 DIAGNOSIS — L89322 Pressure ulcer of left buttock, stage 2: Secondary | ICD-10-CM | POA: Diagnosis not present

## 2022-02-11 DIAGNOSIS — G8929 Other chronic pain: Secondary | ICD-10-CM | POA: Diagnosis present

## 2022-02-11 DIAGNOSIS — I959 Hypotension, unspecified: Secondary | ICD-10-CM | POA: Diagnosis not present

## 2022-02-11 DIAGNOSIS — Z87891 Personal history of nicotine dependence: Secondary | ICD-10-CM

## 2022-02-11 DIAGNOSIS — Z905 Acquired absence of kidney: Secondary | ICD-10-CM | POA: Diagnosis not present

## 2022-02-11 DIAGNOSIS — G9341 Metabolic encephalopathy: Secondary | ICD-10-CM | POA: Diagnosis present

## 2022-02-11 DIAGNOSIS — I248 Other forms of acute ischemic heart disease: Secondary | ICD-10-CM | POA: Diagnosis present

## 2022-02-11 DIAGNOSIS — B9620 Unspecified Escherichia coli [E. coli] as the cause of diseases classified elsewhere: Secondary | ICD-10-CM | POA: Diagnosis present

## 2022-02-11 DIAGNOSIS — Z20822 Contact with and (suspected) exposure to covid-19: Secondary | ICD-10-CM | POA: Diagnosis not present

## 2022-02-11 DIAGNOSIS — R6521 Severe sepsis with septic shock: Secondary | ICD-10-CM

## 2022-02-11 DIAGNOSIS — I700 Atherosclerosis of aorta: Secondary | ICD-10-CM | POA: Diagnosis not present

## 2022-02-11 DIAGNOSIS — N401 Enlarged prostate with lower urinary tract symptoms: Secondary | ICD-10-CM | POA: Diagnosis present

## 2022-02-11 DIAGNOSIS — Y738 Miscellaneous gastroenterology and urology devices associated with adverse incidents, not elsewhere classified: Secondary | ICD-10-CM | POA: Diagnosis present

## 2022-02-11 DIAGNOSIS — B9562 Methicillin resistant Staphylococcus aureus infection as the cause of diseases classified elsewhere: Secondary | ICD-10-CM | POA: Diagnosis present

## 2022-02-11 DIAGNOSIS — N390 Urinary tract infection, site not specified: Secondary | ICD-10-CM | POA: Diagnosis present

## 2022-02-11 DIAGNOSIS — I739 Peripheral vascular disease, unspecified: Secondary | ICD-10-CM | POA: Diagnosis present

## 2022-02-11 DIAGNOSIS — M79671 Pain in right foot: Secondary | ICD-10-CM | POA: Diagnosis present

## 2022-02-11 DIAGNOSIS — N342 Other urethritis: Secondary | ICD-10-CM | POA: Diagnosis present

## 2022-02-11 DIAGNOSIS — Z981 Arthrodesis status: Secondary | ICD-10-CM

## 2022-02-11 DIAGNOSIS — I130 Hypertensive heart and chronic kidney disease with heart failure and stage 1 through stage 4 chronic kidney disease, or unspecified chronic kidney disease: Secondary | ICD-10-CM | POA: Diagnosis present

## 2022-02-11 DIAGNOSIS — Z885 Allergy status to narcotic agent status: Secondary | ICD-10-CM

## 2022-02-11 DIAGNOSIS — Z85528 Personal history of other malignant neoplasm of kidney: Secondary | ICD-10-CM

## 2022-02-11 DIAGNOSIS — Z961 Presence of intraocular lens: Secondary | ICD-10-CM | POA: Diagnosis present

## 2022-02-11 DIAGNOSIS — R319 Hematuria, unspecified: Secondary | ICD-10-CM | POA: Diagnosis present

## 2022-02-11 DIAGNOSIS — F0150 Vascular dementia without behavioral disturbance: Secondary | ICD-10-CM | POA: Diagnosis not present

## 2022-02-11 DIAGNOSIS — Z79899 Other long term (current) drug therapy: Secondary | ICD-10-CM

## 2022-02-11 DIAGNOSIS — N319 Neuromuscular dysfunction of bladder, unspecified: Secondary | ICD-10-CM | POA: Diagnosis present

## 2022-02-11 DIAGNOSIS — Y846 Urinary catheterization as the cause of abnormal reaction of the patient, or of later complication, without mention of misadventure at the time of the procedure: Secondary | ICD-10-CM | POA: Diagnosis not present

## 2022-02-11 DIAGNOSIS — Y929 Unspecified place or not applicable: Secondary | ICD-10-CM | POA: Diagnosis not present

## 2022-02-11 DIAGNOSIS — R338 Other retention of urine: Secondary | ICD-10-CM | POA: Diagnosis present

## 2022-02-11 DIAGNOSIS — Z7982 Long term (current) use of aspirin: Secondary | ICD-10-CM

## 2022-02-11 DIAGNOSIS — N3091 Cystitis, unspecified with hematuria: Secondary | ICD-10-CM | POA: Diagnosis present

## 2022-02-11 DIAGNOSIS — Z1612 Extended spectrum beta lactamase (ESBL) resistance: Secondary | ICD-10-CM | POA: Diagnosis present

## 2022-02-11 DIAGNOSIS — A419 Sepsis, unspecified organism: Secondary | ICD-10-CM | POA: Diagnosis present

## 2022-02-11 DIAGNOSIS — Z9842 Cataract extraction status, left eye: Secondary | ICD-10-CM

## 2022-02-11 DIAGNOSIS — M79672 Pain in left foot: Secondary | ICD-10-CM | POA: Diagnosis present

## 2022-02-11 DIAGNOSIS — N3090 Cystitis, unspecified without hematuria: Secondary | ICD-10-CM | POA: Diagnosis not present

## 2022-02-11 LAB — BLOOD GAS, ARTERIAL
Acid-base deficit: 3.4 mmol/L — ABNORMAL HIGH (ref 0.0–2.0)
Bicarbonate: 21.4 mmol/L (ref 20.0–28.0)
O2 Saturation: 99 %
Patient temperature: 37
pCO2 arterial: 37 mmHg (ref 32–48)
pH, Arterial: 7.37 (ref 7.35–7.45)
pO2, Arterial: 97 mmHg (ref 83–108)

## 2022-02-11 LAB — TROPONIN I (HIGH SENSITIVITY)
Troponin I (High Sensitivity): 73 ng/L — ABNORMAL HIGH (ref <18)
Troponin I (High Sensitivity): 63 ng/L — ABNORMAL HIGH (ref <18)
Troponin I (High Sensitivity): 56 ng/L — ABNORMAL HIGH (ref <18)

## 2022-02-11 LAB — URINE DRUG SCREEN, QUALITATIVE (ARMC ONLY)
Amphetamines, Ur Screen: NOT DETECTED
Barbiturates, Ur Screen: NOT DETECTED
Benzodiazepine, Ur Scrn: NOT DETECTED
Cannabinoid 50 Ng, Ur ~~LOC~~: NOT DETECTED
Cocaine Metabolite,Ur ~~LOC~~: NOT DETECTED
MDMA (Ecstasy)Ur Screen: NOT DETECTED
Methadone Scn, Ur: NOT DETECTED
Opiate, Ur Screen: NOT DETECTED
Phencyclidine (PCP) Ur S: NOT DETECTED
Tricyclic, Ur Screen: NOT DETECTED

## 2022-02-11 LAB — CBC WITH DIFFERENTIAL/PLATELET
Abs Immature Granulocytes: 0.04 10*3/uL (ref 0.00–0.07)
Basophils Absolute: 0 10*3/uL (ref 0.0–0.1)
Basophils Relative: 0 %
Eosinophils Absolute: 0.1 10*3/uL (ref 0.0–0.5)
Eosinophils Relative: 0 %
HCT: 40.5 % (ref 39.0–52.0)
Hemoglobin: 12.7 g/dL — ABNORMAL LOW (ref 13.0–17.0)
Immature Granulocytes: 0 %
Lymphocytes Relative: 11 %
Lymphs Abs: 1.2 10*3/uL (ref 0.7–4.0)
MCH: 26.7 pg (ref 26.0–34.0)
MCHC: 31.4 g/dL (ref 30.0–36.0)
MCV: 85.3 fL (ref 80.0–100.0)
Monocytes Absolute: 0.9 10*3/uL (ref 0.1–1.0)
Monocytes Relative: 8 %
Neutro Abs: 9 10*3/uL — ABNORMAL HIGH (ref 1.7–7.7)
Neutrophils Relative %: 81 %
Platelets: 230 10*3/uL (ref 150–400)
RBC: 4.75 MIL/uL (ref 4.22–5.81)
RDW: 14.4 % (ref 11.5–15.5)
WBC: 11.2 10*3/uL — ABNORMAL HIGH (ref 4.0–10.5)
nRBC: 0 % (ref 0.0–0.2)

## 2022-02-11 LAB — URINALYSIS, ROUTINE W REFLEX MICROSCOPIC
Bilirubin Urine: NEGATIVE
Glucose, UA: NEGATIVE mg/dL
Ketones, ur: NEGATIVE mg/dL
Nitrite: NEGATIVE
Protein, ur: 30 mg/dL — AB
Specific Gravity, Urine: 1.017 (ref 1.005–1.030)
Squamous Epithelial / HPF: NONE SEEN (ref 0–5)
pH: 5 (ref 5.0–8.0)

## 2022-02-11 LAB — COMPREHENSIVE METABOLIC PANEL
ALT: 11 U/L (ref 0–44)
AST: 20 U/L (ref 15–41)
Albumin: 3.8 g/dL (ref 3.5–5.0)
Alkaline Phosphatase: 74 U/L (ref 38–126)
Anion gap: 8 (ref 5–15)
BUN: 22 mg/dL (ref 8–23)
CO2: 22 mmol/L (ref 22–32)
Calcium: 9.1 mg/dL (ref 8.9–10.3)
Chloride: 106 mmol/L (ref 98–111)
Creatinine, Ser: 1.21 mg/dL (ref 0.61–1.24)
GFR, Estimated: >60 mL/min (ref >60)
Glucose, Bld: 147 mg/dL — ABNORMAL HIGH (ref 70–99)
Potassium: 3.8 mmol/L (ref 3.5–5.1)
Sodium: 136 mmol/L (ref 135–145)
Total Bilirubin: 0.8 mg/dL (ref 0.3–1.2)
Total Protein: 7.4 g/dL (ref 6.5–8.1)

## 2022-02-11 LAB — RESP PANEL BY RT-PCR (FLU A&B, COVID) ARPGX2
Influenza A by PCR: NEGATIVE
Influenza B by PCR: NEGATIVE
SARS Coronavirus 2 by RT PCR: NEGATIVE

## 2022-02-11 LAB — CORTISOL: Cortisol, Plasma: 6.5 ug/dL

## 2022-02-11 LAB — GLUCOSE, CAPILLARY
Glucose-Capillary: 102 mg/dL — ABNORMAL HIGH (ref 70–99)
Glucose-Capillary: 114 mg/dL — ABNORMAL HIGH (ref 70–99)

## 2022-02-11 LAB — LACTIC ACID, PLASMA
Lactic Acid, Venous: 1.9 mmol/L (ref 0.5–1.9)
Lactic Acid, Venous: 1.3 mmol/L (ref 0.5–1.9)

## 2022-02-11 LAB — PROCALCITONIN: Procalcitonin: <0.1 ng/mL

## 2022-02-11 LAB — PROTIME-INR
INR: 1.2 (ref 0.8–1.2)
Prothrombin Time: 14.8 seconds (ref 11.4–15.2)

## 2022-02-11 LAB — MRSA NEXT GEN BY PCR, NASAL: MRSA by PCR Next Gen: DETECTED — AB

## 2022-02-11 LAB — APTT: aPTT: 38 seconds — ABNORMAL HIGH (ref 24–36)

## 2022-02-11 MED ORDER — SODIUM CHLORIDE 0.9 % IV SOLN
1.0000 g | Freq: Three times a day (TID) | INTRAVENOUS | Status: DC
  Administered 2022-02-11 – 2022-02-13 (×7): 1 g via INTRAVENOUS
  Filled 2022-02-11: qty 1
  Filled 2022-02-11: qty 20
  Filled 2022-02-11 (×2): qty 1
  Filled 2022-02-11 (×4): qty 20

## 2022-02-11 MED ORDER — ASPIRIN 81 MG PO TBEC
81.0000 mg | DELAYED_RELEASE_TABLET | Freq: Every day | ORAL | Status: DC
  Administered 2022-02-11 – 2022-02-14 (×4): 81 mg via ORAL
  Filled 2022-02-11 (×4): qty 1

## 2022-02-11 MED ORDER — POLYETHYLENE GLYCOL 3350 17 G PO PACK: 17.0000 g | PACK | Freq: Every day | ORAL | Status: DC | PRN

## 2022-02-11 MED ORDER — DONEPEZIL HCL 5 MG PO TABS: 5.0000 mg | ORAL_TABLET | Freq: Every day | ORAL | Status: DC

## 2022-02-11 MED ORDER — SODIUM CHLORIDE 0.9 % IV BOLUS (SEPSIS)
1000.0000 mL | Freq: Once | INTRAVENOUS | Status: AC
  Administered 2022-02-11: 1000 mL via INTRAVENOUS

## 2022-02-11 MED ORDER — LIDOCAINE HCL URETHRAL/MUCOSAL 2 % EX GEL
1.0000 | Freq: Once | CUTANEOUS | Status: AC
  Administered 2022-02-11: 1 via TOPICAL
  Filled 2022-02-11: qty 10

## 2022-02-11 MED ORDER — NOREPINEPHRINE 4 MG/250ML-% IV SOLN
2.0000 ug/min | INTRAVENOUS | Status: DC
  Administered 2022-02-11: 3 ug/min via INTRAVENOUS

## 2022-02-11 MED ORDER — MIDODRINE HCL 5 MG PO TABS
10.0000 mg | ORAL_TABLET | Freq: Three times a day (TID) | ORAL | Status: DC
  Administered 2022-02-11 – 2022-02-13 (×5): 10 mg via ORAL
  Filled 2022-02-11 (×6): qty 2

## 2022-02-11 MED ORDER — SODIUM CHLORIDE 0.9 % IV BOLUS (SEPSIS)
500.0000 mL | Freq: Once | INTRAVENOUS | Status: AC
  Administered 2022-02-11: 500 mL via INTRAVENOUS

## 2022-02-11 MED ORDER — SODIUM CHLORIDE 0.9 % IV SOLN: 250.0000 mL | INTRAVENOUS | Status: DC

## 2022-02-11 MED ORDER — TAMSULOSIN HCL 0.4 MG PO CAPS: 0.4000 mg | ORAL_CAPSULE | Freq: Every day | ORAL | Status: DC

## 2022-02-11 MED ORDER — NOREPINEPHRINE 4 MG/250ML-% IV SOLN
0.0000 ug/min | INTRAVENOUS | Status: DC
  Administered 2022-02-11: 2 ug/min via INTRAVENOUS
  Filled 2022-02-11: qty 250

## 2022-02-11 MED ORDER — LACTATED RINGERS IV BOLUS
1000.0000 mL | Freq: Once | INTRAVENOUS | Status: AC
  Administered 2022-02-11: 1000 mL via INTRAVENOUS

## 2022-02-11 MED ORDER — SODIUM CHLORIDE 0.9 % IV SOLN
2.0000 g | Freq: Once | INTRAVENOUS | Status: AC
  Administered 2022-02-11: 2 g via INTRAVENOUS
  Filled 2022-02-11: qty 12.5

## 2022-02-11 MED ORDER — ATORVASTATIN CALCIUM 20 MG PO TABS
40.0000 mg | ORAL_TABLET | Freq: Every evening | ORAL | Status: DC
  Administered 2022-02-12 – 2022-02-13 (×2): 40 mg via ORAL
  Filled 2022-02-11 (×2): qty 2

## 2022-02-11 MED ORDER — ENOXAPARIN SODIUM 60 MG/0.6ML IJ SOSY
0.5000 mg/kg | PREFILLED_SYRINGE | INTRAMUSCULAR | Status: DC
  Administered 2022-02-12: 52.5 mg via SUBCUTANEOUS
  Filled 2022-02-11: qty 0.6

## 2022-02-11 MED ORDER — LACTATED RINGERS IV SOLN: INTRAVENOUS | Status: AC

## 2022-02-11 MED ORDER — ENOXAPARIN SODIUM 40 MG/0.4ML IJ SOSY: 40.0000 mg | PREFILLED_SYRINGE | INTRAMUSCULAR | Status: DC

## 2022-02-11 MED ORDER — CHLORHEXIDINE GLUCONATE CLOTH 2 % EX PADS
6.0000 | MEDICATED_PAD | Freq: Every day | CUTANEOUS | Status: DC
  Administered 2022-02-11 – 2022-02-14 (×4): 6 via TOPICAL

## 2022-02-11 MED ORDER — DOCUSATE SODIUM 100 MG PO CAPS: 100.0000 mg | ORAL_CAPSULE | Freq: Two times a day (BID) | ORAL | Status: DC | PRN

## 2022-02-11 NOTE — ED Triage Notes (Signed)
Pt arrives from home via EMS w/ c/o 10/10 pain with indwelling catheter. Pt endorsing he thinks he has a UTI. EMS reports multiple previous calls r/t same.   98 oral 134/76 84 98%

## 2022-02-11 NOTE — H&P (Addendum)
NAME:  Adam Klas., MRN:  [id number], DOB:  [birth year], LOS: 0 ADMISSION DATE:  02/11/2022, CONSULTATION DATE:  02/11/22 REFERRING MD:  Dr. Cheri Fowler, CHIEF COMPLAINT:  Penile pain with chronic foley, foul smelling urine   Brief Pt Description / Synopsis:  81 y.o. male with PMH most significant for Neurogenic bladder with chronic foley admitted with Septic Shock in setting of UTI.  History of Present Illness:  Adam Rios is an 81 year old male with a past medical history significant for recurrent UTIs due to chronic Foley, neurogenic bladder, renal cell carcinoma status post nephrectomy, CKD, BPH, hypertension, GERD, CVA, vascular dementia who presented to [facility] ED on 02/11/2022 due to complaints of penile pain with chronic Foley catheter and foul-smelling urine.  Patient reported the pain has been progressive over the last week.  He denied chest pain, shortness of breath, abdominal pain, nausea, vomiting, diarrhea, fever, chills.  Of note he was most recently seen in the ED on 01/01/22 due to complaints of suprapubic pain, low back pain, and concern that his chronic foley was blocked.  He was found to have a UTI, was given IV Rocephin and foley was exchanged.  He was discharged home from ED with plan to complete course of Cefdinir.  Of note he has also had positive urine cultures back in April 2023 growing pseudomonas,  and blood cultures with ESBL E. Coli in June 2023.  ED Course: Initial Vital Signs: Temperature 98 Fahrenheit orally, pulse 84, blood pressure 134/76, 94% on room air Significant Labs: Glucose 147, BUN 22, creatinine 1.2, WBC 11.2 with neutrophilia, lactic acid 1.9 ABG: pH 7.37/PCO2 37/PO2 97/bicarb 21.4 COVID-19 and influenza PCR negative Urinalysis is consistent with UTI (moderate leukocytes, many bacteria, WBC 21-50) Imaging Chest X-ray>>IMPRESSION: 1. Very low lung volumes. No acute cardiopulmonary abnormality. 2. Aortic Atherosclerosis (ICD10-I70.0). Calcified  postinflammatory mediastinal lymph nodes. EKG: normal sinus rhythm, no evidence of acute ischemia Medications Administered: 3.5 L of normal saline boluses, IV cefepime  He met sepsis criteria and was noted to become hypotensive therefore he was given IV fluid resuscitation and broad-spectrum IV antibiotics.  Despite fluid resuscitation he remained hypotensive which peripheral Levophed was started.  PCCM is asked to admit to ICU for further work-up and treatment.  Pertinent  Medical History   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[place])    Hypertension    Neurogenic bladder    Renal cell carcinoma ([place]) 2005   Renal disorder    Stroke ([place])     01/17/18, 12/21   Some weakness and cognitive decline    Micro Data:  9/13: Blood culture x2>> 9/13: Urine>>  Antimicrobials:  Cefepime 9/13 x1 dose Meropenem 9/13>>  Significant Hospital Events: Including procedures, antibiotic start and stop dates in addition to other pertinent events   9/13: Presents to ED due to concern with chronic Foley.  Foley catheter exchanged.  Requiring low-dose Levophed despite IV fluid resuscitation.  PCCM asked to admit  Interim History / Subjective:  -Patient presented to ED with complaints of penile pain with chronic Foley and foul-smelling urine concerning for UTI -Found to be hypotensive, received 3 and half liters of IV fluid resuscitation and remains hypotensive, therefore peripheral Levophed initiated -Foley catheter exchanged in ED -PCCM asked to admit  Objective   Blood pressure (!) 88/60, pulse (!) 57, temperature 98 F (36.7 C), resp. rate 14,  height 6' 1"  (1.854 m), weight 105.2 kg, SpO2 96 %.        Intake/Output Summary (Last 24 hours) at 02/11/2022 1113 Last data filed at 02/11/2022 1111 Gross per 24 hour  Intake 1102.76 ml  Output --  Net 1102.76 ml   Filed  Weights   02/11/22 0837  Weight: 105.2 kg    Examination: General: Acute on chronically ill-appearing male, laying in bed, on 2 L nasal cannula, no acute distress HENT: Atraumatic, normocephalic, neck supple able to assess JVD due to body habitus Lungs: Clear breath sounds bilaterally, even, nonlabored Cardiovascular: Regular rate and rhythm, S1-S2, no murmurs, rubs, gallops Abdomen: Obese, soft, nontender, nondistended, no guarding rebound tenderness, bowel sounds positive x4 Extremities: No deformities, chronic venous trophic changes of bilateral lower extremities, 1+ edema bilateral lower extremities, extremities warm Neuro: Lethargic, arouses easily to voice, oriented x3, moves all extremities to commands, no focal deficits, speech clear, pupils PERRLA GU: Foley catheter exchange in place (exchanged in ED) with mild bleeding due to trauma from exchange, urine is now yellow  Resolved Hospital Problem list     Assessment & Plan:   Septic Shock Mildly elevated Troponin, suspect demand ischemia HFpEF without acute exacerbation PMHx: HTN, PVD Echocardiogram 11/13/21 with LVEF >55%, grade I DD, RV normal systolic function -Continuous cardiac monitoring -Maintain MAP >65 -IV fluids (received 3.5 L NS in ED) -Vasopressors as needed to maintain MAP goal -Start Midodrine -Lactic acid has normalized (1.9 ~ 1.3) -HS Troponin peaked at 73  -Check cortisol -Hold home Lasix, Losartan, and Spironolactone given shock  Severe Sepsis due to suspected UTI PMHx: UTI's with Chronic Foley catheter due to Neurogenic bladder (ESBL e. coli back in June 2023) -Monitor fever curve -Trend WBC's & Procalcitonin -Follow cultures as above -Start empiric Meropenem pending cultures & sensitivities -Chronic foley exchanged in ED 9/13 (ED provider discussed with Urology who recommended catheter exchange, pt has requested outpatient management with chronic foley, has declined suprapubic catheter  placement)  AKI on CKD BPH Neurogenic bladder PMHx: Left Renal cell carcinoma status post nephrectomy in 1984 Baseline creatinine appears to range from 0.8-1.0 as noted in June 2023 -Monitor I&O's / urinary output -Follow BMP -Ensure adequate renal perfusion -Avoid nephrotoxic agents as able -Replace electrolytes as indicated -IV fluids -Continue Foley catheter -Continue home Flomax  Acute Metabolic Encephalopathy in setting of sepsis and UTI PMHx: CVA, Vascular dementia -Treatment of UTI as above -Provide supportive care -Avoid sedating meds as able -ABG normal, UDS is negative -Continue home Aricept    Best Practice (right click and "Reselect all SmartList Selections" daily)   Diet/type: Regular consistency (see orders) DVT prophylaxis: LMWH GI prophylaxis: N/A Lines: N/A Foley:  Yes, and it is still needed due to Neurogenic bladder (foley exchanged in ED 9/13) Code Status:  full code Last date of multidisciplinary goals of care discussion [N/A]  9/13: Updated pt's daughter Adam Rios via telephone.  All questions answered.  Labs   CBC: Recent Labs  Lab 02/11/22 0842  WBC 11.2*  NEUTROABS 9.0*  HGB 12.7*  HCT 40.5  MCV 85.3  PLT 151    Basic Metabolic Panel: Recent Labs  Lab 02/11/22 0842  NA 136  K 3.8  CL 106  CO2 22  GLUCOSE 147*  BUN 22  CREATININE 1.21  CALCIUM 9.1   GFR: Estimated Creatinine Clearance: 61 mL/min (by C-G formula based on SCr of 1.21 mg/dL). Recent Labs  Lab 02/11/22 0842 02/11/22 0906  WBC 11.2*  --  LATICACIDVEN  --  1.9    Liver Function Tests: Recent Labs  Lab 02/11/22 0842  AST 20  ALT 11  ALKPHOS 74  BILITOT 0.8  PROT 7.4  ALBUMIN 3.8   No results for input(s): "LIPASE", "AMYLASE" in the last 168 hours. No results for input(s): "AMMONIA" in the last 168 hours.  ABG No results found for: "PHART", "PCO2ART", "PO2ART", "HCO3", "TCO2", "ACIDBASEDEF", "O2SAT"   Coagulation Profile: Recent Labs  Lab  02/11/22 0906  INR 1.2    Cardiac Enzymes: No results for input(s): "CKTOTAL", "CKMB", "CKMBINDEX", "TROPONINI" in the last 168 hours.  HbA1C: No results found for: "HGBA1C"  CBG: No results for input(s): "GLUCAP" in the last 168 hours.  Review of Systems:   Positives in BOLD: Gen: Denies fever, chills, weight change, fatigue, night sweats HEENT: Denies blurred vision, double vision, hearing loss, tinnitus, sinus congestion, rhinorrhea, sore throat, neck stiffness, dysphagia PULM: Denies shortness of breath, cough, sputum production, hemoptysis, wheezing CV: Denies chest pain, edema, orthopnea, paroxysmal nocturnal dyspnea, palpitations GI: Denies abdominal pain, nausea, vomiting, diarrhea, hematochezia, melena, constipation, change in bowel habits GU: Denies dysuria,+penile pain with chronic urethral catheter, foul smelling urine hematuria, polyuria, oliguria, urethral discharge Endocrine: Denies hot or cold intolerance, polyuria, polyphagia or appetite change Derm: Denies rash, dry skin, scaling or peeling skin change Heme: Denies easy bruising, bleeding, bleeding gums Neuro: Denies headache, numbness, weakness, slurred speech, loss of memory or consciousness   Past Medical History:  He,  has a past medical history of Cognitive impairment, Complication of anesthesia, Hemorrhagic cerebrovascular accident (CVA) ([place]), Hypertension, Neurogenic bladder, Renal cell carcinoma ([place]) (2005), Renal disorder, and Stroke ([place]).   Surgical History:   Past Surgical History:  Procedure Laterality Date   APPENDECTOMY     CATARACT EXTRACTION W/PHACO Right 07/22/2020   Procedure: CATARACT EXTRACTION PHACO AND INTRAOCULAR LENS PLACEMENT (IOC) RIGHT;  Surgeon: Eulogio Bear, MD;  Location: [place];  Service: Ophthalmology;  Laterality: Right;  4.77 0:44.2   CATARACT EXTRACTION W/PHACO Left 08/05/2020   Procedure: CATARACT EXTRACTION PHACO AND INTRAOCULAR LENS PLACEMENT (IOC) LEFT  10.73 01:13.8;  Surgeon: Eulogio Bear, MD;  Location: [place];  Service: Ophthalmology;  Laterality: Left;  Use eye stretcher not chair   HERNIA REPAIR     LUMBAR FUSION     NEPHRECTOMY Left 2005     Social History:   reports that he quit smoking about 18 years ago. His smoking use included cigarettes. He has a 2.50 pack-year smoking history. He has never used smokeless tobacco. He reports that he does not currently use alcohol. He reports that he does not use drugs.   Family History:  His family history is not on file.   Allergies Allergies  Allergen Reactions   Fentanyl Nausea Only   Oxycodone Nausea And Vomiting     Home Medications  Prior to Admission medications   Medication Sig Start Date End Date Taking? Authorizing Provider  acetaminophen (TYLENOL) 325 MG tablet Take 500 mg by mouth every 6 (six) hours as needed. 08/14/14   [provider]  aspirin 81 MG EC tablet Take 81 mg by mouth daily.    [provider]  atorvastatin (LIPITOR) 40 MG tablet Take 40 mg by mouth daily.    [provider]  cefdinir (OMNICEF) 300 MG capsule Take 1 capsule (300 mg total) by mouth 2 (two) times daily. 01/01/22   Carrie Mew, MD  diclofenac Sodium (VOLTAREN) 1 % GEL Apply 2 g  topically 2 (two) times daily. 05/11/21   [provider]  donepezil (ARICEPT) 5 MG tablet Take 5 mg by mouth at bedtime.    [provider]  furosemide (LASIX) 40 MG tablet Take 1 tablet (40 mg total) by mouth 2 (two) times daily. 11/28/21   Sidney Ace, MD  gabapentin (NEURONTIN) 300 MG capsule Take 1 capsule (300 mg total) by mouth 2 (two) times daily. 11/28/21   Sreenath, Sudheer B, MD  lidocaine (LIDODERM) 5 % Place 2 patches onto the skin daily. 09/04/21   [provider]  losartan (COZAAR) 25 MG tablet Take 1 tablet (25 mg total) by mouth daily. 11/28/21 01/27/22  Sidney Ace, MD  mirabegron ER (MYRBETRIQ) 25 MG TB24 tablet Take 25  mg by mouth daily.    [provider]  omeprazole (PRILOSEC) 20 MG capsule Take 1 capsule (20 mg total) by mouth daily. 05/21/21   Jennye Boroughs, MD  ondansetron (ZOFRAN-ODT) 4 MG disintegrating tablet Take 1 tablet (4 mg total) by mouth every 6 (six) hours as needed for nausea or vomiting. 11/15/21   Ward, Delice Bison, DO  senna-docusate (SENOKOT-S) 8.6-50 MG tablet Take 1 tablet by mouth 2 (two) times daily. 09/10/21   Dwyane Dee, MD  spironolactone (ALDACTONE) 25 MG tablet Take 25 mg by mouth every morning. 03/08/21   [provider]  tamsulosin (FLOMAX) 0.4 MG CAPS capsule Take 0.4 mg by mouth daily.    [provider]  traZODone (DESYREL) 50 MG tablet Take 50-100 mg by mouth at bedtime. 08/12/21   [provider]  vitamin B-12 (CYANOCOBALAMIN) 1000 MCG tablet Take 1,000 mcg by mouth daily.    [provider]     Critical care time: 45 minutes     Darel Hong, AGACNP-BC [facility] epic messenger for cross cover needs If after hours, please call E-link

## 2022-02-11 NOTE — Consult Note (Signed)
Pharmacy Antibiotic Note  Adam Rios. is a 81 y.o. male admitted on 02/11/2022 with UTI. Patient has a chronic indwelling foley with recurrent UTIs. Patient had positive urine cultures back in April and June 2023, growing pseudomonas and e. Coli. In June 2023, patient had a positive blood culture for ESBL ecoli. Pharmacy has been consulted for meropenem dosing.  Plan: Patient received cefepime x1 in the ED. Start meropenem 1g q8h. Continue to monitor renal function and cultures for de-escalation.   Height: '6\' 1"'$  (185.4 cm) Weight: 105.2 kg (232 lb) IBW/kg (Calculated) : 79.9  Temp (24hrs), Avg:98 F (36.7 C), Min:98 F (36.7 C), Max:98 F (36.7 C)  Recent Labs  Lab 02/11/22 0842 02/11/22 0906 02/11/22 1036  WBC 11.2*  --   --   CREATININE 1.21  --   --   LATICACIDVEN  --  1.9 1.3    Estimated Creatinine Clearance: 61 mL/min (by C-G formula based on SCr of 1.21 mg/dL).    Allergies  Allergen Reactions   Fentanyl Nausea Only   Oxycodone Nausea And Vomiting    Antimicrobials this admission: 9/13 Cefepime x1 9/13 Meropenem >>  Dose adjustments this admission: none  Microbiology results: 9/13 BCx: in process 9/13 UCx: in process   Thank you for allowing pharmacy to be a part of this patient's care.  [place] Student  02/11/2022 11:21 AM

## 2022-02-11 NOTE — ED Notes (Signed)
Informed RN bed assigned 

## 2022-02-11 NOTE — Consult Note (Addendum)
PHARMACY CONSULT NOTE - FOLLOW UP  Pharmacy Consult for Electrolyte Monitoring and Replacement   Recent Labs: Potassium (mmol/L)  Date Value  02/11/2022 3.8   Magnesium (mg/dL)  Date Value  09/10/2021 2.2   Calcium (mg/dL)  Date Value  02/11/2022 9.1   Albumin (g/dL)  Date Value  02/11/2022 3.8   Phosphorus (mg/dL)  Date Value  09/08/2021 3.6   Sodium (mmol/L)  Date Value  02/11/2022 136  Corrected Ca:  9/13 9.3  Assessment: Adam Rios. is a 81 y.o. male presenting with penile pain.PMH significant for HTN, CKD, renal cell cancer, GERD, chronic pain, BPH, hx CVA, recurrent UTI with foley cath. Patient currently on LR @ 150 mL/min (scheduled to end 9/14 @ 0459). Pharmacy has been consulted to monitor and replace electrolytes.   Goal of Therapy: Electrolytes WNL  Plan:  No replacement warranted at this time Will follow up with AM labs  Thank you for allowing pharmacy to be a part of this patient's care.  Gretel Acre, PharmD PGY1 Pharmacy Resident 02/11/2022 11:16 AM

## 2022-02-11 NOTE — Progress Notes (Signed)
PHARMACIST - PHYSICIAN COMMUNICATION  CONCERNING:  Enoxaparin (Lovenox) for DVT Prophylaxis    RECOMMENDATION: Patient was prescribed enoxaprin '40mg'$  q24 hours for VTE prophylaxis.   Filed Weights   02/11/22 0837  Weight: 105.2 kg (232 lb)    Body mass index is 30.61 kg/m.  Estimated Creatinine Clearance: 61 mL/min (by C-G formula based on SCr of 1.21 mg/dL).   Based on [place] patient is candidate for enoxaparin 0.'5mg'$ /kg TBW SQ every 24 hours based on BMI being >30.  DESCRIPTION: Pharmacy has adjusted enoxaparin dose per [facility] policy.  Patient is now receiving enoxaparin 52.5 mg every 24 hours    Pernell Dupre, PharmD, BCPS Clinical Pharmacist 02/11/2022 11:17 AM

## 2022-02-11 NOTE — Sepsis Progress Note (Signed)
ELink tracking the Code Sepsis. 

## 2022-02-11 NOTE — Sepsis Progress Note (Signed)
Notified provider of need to order vasopressors.  

## 2022-02-11 NOTE — Sepsis Progress Note (Signed)
Notified bedside nurse of need to administer antibiotics.  

## 2022-02-11 NOTE — Progress Notes (Signed)
CODE SEPSIS - PHARMACY COMMUNICATION  **Broad Spectrum Antibiotics should be administered within 1 hour of Sepsis diagnosis**  Time Code Sepsis Called/Page Received: 1683  Antibiotics Ordered: cefepime 2 grams x 1  Time of 1st antibiotic administration: 0915  Additional action taken by pharmacy: none  If necessary, Name of Provider/Nurse Contacted: n/a    Glean Salvo, PharmD Clinical Pharmacist  02/11/2022 9:29 AM

## 2022-02-11 NOTE — ED Provider Notes (Signed)
[facility] Provider Note   Event Date/Time   First MD Initiated Contact with Patient 02/11/22 0831     (approximate) History  Urinary Tract Infection  HPI Adam Rios. is a 81 y.o. male with a stated past medical history of recurrent UTI with Foley catheter in place, renal cell cancer, GERD, hypertension, CKD, and BPH who presents via EMS with concerns for penile pain secondary to his catheter as well as foul-smelling urine being extracted from the catheter.  Patient states that the symptoms have been present over the last week and have been worsening. ROS: Patient currently denies any vision changes, tinnitus, difficulty speaking, facial droop, sore throat, chest pain, shortness of breath, abdominal pain, nausea/vomiting/diarrhea, or weakness/numbness/paresthesias in any extremity   Physical Exam  Triage Vital Signs: ED Triage Vitals  Enc Vitals Group     BP      Pulse      Resp      Temp      Temp src      SpO2      Weight      Height      Head Circumference      Peak Flow      Pain Score      Pain Loc      Pain Edu?      Excl. in [place]?    Most recent vital signs: Vitals:   02/11/22 1140 02/11/22 1259  BP: (!) 91/58 104/67  Pulse: (!) 57 96  Resp: 15 14  Temp:  97.9 F (36.6 C)  SpO2: 98% 100%   General: Awake, oriented x4. CV:  Good peripheral perfusion.  Resp:  Normal effort.  Abd:  No distention.  GU:   Other:  Obese elderly Caucasian male laying in bed in mild distress secondary to pain. ED Results / Procedures / Treatments  Labs (all labs ordered are listed, but only abnormal results are displayed) Labs Reviewed  COMPREHENSIVE METABOLIC PANEL - Abnormal; Notable for the following components:      Result Value   Glucose, Bld 147 (*)    All other components within normal limits  CBC WITH DIFFERENTIAL/PLATELET - Abnormal; Notable for the following components:   WBC 11.2 (*)    Hemoglobin 12.7 (*)    Neutro Abs 9.0 (*)     All other components within normal limits  URINALYSIS, ROUTINE W REFLEX MICROSCOPIC - Abnormal; Notable for the following components:   Color, Urine YELLOW (*)    APPearance HAZY (*)    Hgb urine dipstick SMALL (*)    Protein, ur 30 (*)    Leukocytes,Ua MODERATE (*)    Bacteria, UA MANY (*)    All other components within normal limits  APTT - Abnormal; Notable for the following components:   aPTT 38 (*)    All other components within normal limits  BLOOD GAS, ARTERIAL - Abnormal; Notable for the following components:   Acid-base deficit 3.4 (*)    All other components within normal limits  GLUCOSE, CAPILLARY - Abnormal; Notable for the following components:   Glucose-Capillary 102 (*)    All other components within normal limits  TROPONIN I (HIGH SENSITIVITY) - Abnormal; Notable for the following components:   Troponin I (High Sensitivity) 73 (*)    All other components within normal limits  RESP PANEL BY RT-PCR (FLU A&B, COVID) ARPGX2  CULTURE, BLOOD (ROUTINE X 2)  CULTURE, BLOOD (ROUTINE X 2)  URINE CULTURE  MRSA NEXT  GEN BY PCR, NASAL  LACTIC ACID, PLASMA  LACTIC ACID, PLASMA  PROTIME-INR  PROCALCITONIN  URINE DRUG SCREEN, QUALITATIVE ([facility] ONLY)  CORTISOL   EKG ED ECG REPORT I, Naaman Plummer, the attending physician, personally viewed and interpreted this ECG. Date: 02/11/2022 EKG Time: 0942 Rate: 60 Rhythm: normal sinus rhythm QRS Axis: normal Intervals: normal ST/T Wave abnormalities: normal Narrative Interpretation: no evidence of acute ischemia RADIOLOGY ED MD interpretation: One-view portable chest x-ray interpreted by me shows no evidence of acute abnormalities including no pneumonia, pneumothorax, or widened mediastinum -Agree with radiology assessment Official radiology report(s): DG Chest Port 1 View  Result Date: 02/11/2022 CLINICAL DATA:  81 year old male with possible sepsis.  Pain. EXAM: PORTABLE CHEST 1 VIEW COMPARISON:  Portable chest 01/01/2022  and earlier. FINDINGS: Portable AP semi upright view at 0907 hours. Very low lung volumes. Chronic elevation of the right hemidiaphragm. Calcified aortic atherosclerosis. Postinflammatory calcified mediastinal lymph nodes. Other mediastinal contours are within normal limits. No pneumothorax, pulmonary edema, pleural effusion or consolidation. Visualized tracheal air column is within normal limits. No acute osseous abnormality identified. Paucity of bowel gas. IMPRESSION: 1. Very low lung volumes. No acute cardiopulmonary abnormality. 2. Aortic Atherosclerosis (ICD10-I70.0). Calcified postinflammatory mediastinal lymph nodes. Electronically Signed   By: Genevie Ann M.D.   On: 02/11/2022 09:32   PROCEDURES: Critical Care performed: Yes, see critical care procedure note(s) .1-3 Lead EKG Interpretation  Performed by: Naaman Plummer, MD Authorized by: Naaman Plummer, MD     Interpretation: normal     ECG rate:  97   ECG rate assessment: normal     Rhythm: sinus rhythm     Ectopy: none     Conduction: normal   CRITICAL CARE Performed by: Naaman Plummer  Total critical care time: 37 minutes  Critical care time was exclusive of separately billable procedures and treating other patients.  Critical care was necessary to treat or prevent imminent or life-threatening deterioration.  Critical care was time spent personally by me on the following activities: development of treatment plan with patient and/or surrogate as well as nursing, discussions with consultants, evaluation of patient's response to treatment, examination of patient, obtaining history from patient or surrogate, ordering and performing treatments and interventions, ordering and review of laboratory studies, ordering and review of radiographic studies, pulse oximetry and re-evaluation of patient's condition.  MEDICATIONS ORDERED IN ED: Medications  lactated ringers infusion (0 mLs Intravenous Paused 02/11/22 1223)  midodrine (PROAMATINE)  tablet 10 mg (10 mg Oral Not Given 02/11/22 1218)  docusate sodium (COLACE) capsule 100 mg (has no administration in time range)  polyethylene glycol (MIRALAX / GLYCOLAX) packet 17 g (has no administration in time range)  enoxaparin (LOVENOX) injection 52.5 mg (has no administration in time range)  0.9 %  sodium chloride infusion (250 mLs Intravenous Not Given 02/11/22 1308)  norepinephrine (LEVOPHED) '4mg'$  in 276m (0.016 mg/mL) premix infusion (3 mcg/min Intravenous New Bag/Given 02/11/22 1200)  aspirin EC tablet 81 mg (81 mg Oral Given 02/11/22 1311)  atorvastatin (LIPITOR) tablet 40 mg (has no administration in time range)  tamsulosin (FLOMAX) capsule 0.4 mg (has no administration in time range)  donepezil (ARICEPT) tablet 5 mg (has no administration in time range)  meropenem (MERREM) 1 g in sodium chloride 0.9 % 100 mL IVPB (0 g Intravenous Stopped 02/11/22 1309)  Chlorhexidine Gluconate Cloth 2 % PADS 6 each (6 each Topical Given 02/11/22 1308)  lactated ringers bolus 1,000 mL (has no administration in time range)  lidocaine (XYLOCAINE) 2 % jelly 1 Application (1 Application Topical Given 02/11/22 0841)  sodium chloride 0.9 % bolus 1,000 mL (0 mLs Intravenous Stopped 02/11/22 0954)    And  sodium chloride 0.9 % bolus 1,000 mL (0 mLs Intravenous Stopped 02/11/22 0941)    And  sodium chloride 0.9 % bolus 1,000 mL (0 mLs Intravenous Stopped 02/11/22 1033)    And  sodium chloride 0.9 % bolus 500 mL (0 mLs Intravenous Stopped 02/11/22 1018)  ceFEPIme (MAXIPIME) 2 g in sodium chloride 0.9 % 100 mL IVPB (0 g Intravenous Stopped 02/11/22 0953)   IMPRESSION / MDM / ASSESSMENT AND PLAN / ED COURSE  I reviewed the triage vital signs and the nursing notes.                             The patient is on the cardiac monitor to evaluate for evidence of arrhythmia and/or significant heart rate changes. Patient's presentation is most consistent with acute presentation with potential threat to life or bodily  function. The Pt presents with penile pain and foul-smelling discharge from his Foley catheter highly concerning for sepsis (suspected urinary source). At this time, the Pt is satting well on baseline 2 L nasal cannula, normotensive, and appears HDS.  Will start empiric antibiotics and fluids.  Due to hypotension, will administer fluids gradually with frequent reassessment. Have low suspicion for a GI, skin/soft tissue, or CNS source at this time, but will reconsider if initial workup is unremarkable.  - CBC, BMP, LFTs - VBG - UA - BCx x2, Lactate - EKG - CXR - Empiric Abx: Rocephin - Fluids: 30cc/kg NS Despite 30 cc/kg IV fluid resuscitation, patient maintained maps below 65 and therefore was started on norepinephrine drip prior to admission. Consult: I spoke to Dr. Mortimer Fries in the ICU who agrees to set this patient onto his service for further evaluation and management.  Dispo: Admit to the ICU   FINAL CLINICAL IMPRESSION(S) / ED DIAGNOSES   Final diagnoses:  Sepsis without acute organ dysfunction, due to unspecified organism [facility])  Urinary tract infection with hematuria, site unspecified   Rx / DC Orders   ED Discharge Orders     None      Note:  This document was prepared using Dragon voice recognition software and may include unintentional dictation errors.   Naaman Plummer, MD 02/11/22 1328

## 2022-02-11 NOTE — Progress Notes (Signed)
PHARMACY -  BRIEF ANTIBIOTIC NOTE   Pharmacy has received consult(s) for cefepime from an ED provider.  The patient's profile has been reviewed for ht/wt/allergies/indication/available labs.    One time order(s) placed for 2 grams IV cefepime x 1  Further antibiotics/pharmacy consults should be ordered by admitting physician if indicated.                       Thank you, Dallie Piles 02/11/2022  8:57 AM

## 2022-02-12 DIAGNOSIS — A419 Sepsis, unspecified organism: Secondary | ICD-10-CM | POA: Diagnosis not present

## 2022-02-12 DIAGNOSIS — T83518A Infection and inflammatory reaction due to other urinary catheter, initial encounter: Secondary | ICD-10-CM | POA: Diagnosis not present

## 2022-02-12 DIAGNOSIS — R6521 Severe sepsis with septic shock: Secondary | ICD-10-CM | POA: Diagnosis not present

## 2022-02-12 LAB — CBC
HCT: 33.7 % — ABNORMAL LOW (ref 39.0–52.0)
Hemoglobin: 10.5 g/dL — ABNORMAL LOW (ref 13.0–17.0)
MCH: 26.6 pg (ref 26.0–34.0)
MCHC: 31.2 g/dL (ref 30.0–36.0)
MCV: 85.3 fL (ref 80.0–100.0)
Platelets: 194 10*3/uL (ref 150–400)
RBC: 3.95 MIL/uL — ABNORMAL LOW (ref 4.22–5.81)
RDW: 14.5 % (ref 11.5–15.5)
WBC: 7.3 10*3/uL (ref 4.0–10.5)
nRBC: 0 % (ref 0.0–0.2)

## 2022-02-12 LAB — BASIC METABOLIC PANEL
Anion gap: 6 (ref 5–15)
BUN: 14 mg/dL (ref 8–23)
CO2: 23 mmol/L (ref 22–32)
Calcium: 8.2 mg/dL — ABNORMAL LOW (ref 8.9–10.3)
Chloride: 107 mmol/L (ref 98–111)
Creatinine, Ser: 1.04 mg/dL (ref 0.61–1.24)
GFR, Estimated: >60 mL/min (ref >60)
Glucose, Bld: 102 mg/dL — ABNORMAL HIGH (ref 70–99)
Potassium: 3.8 mmol/L (ref 3.5–5.1)
Sodium: 136 mmol/L (ref 135–145)

## 2022-02-12 LAB — PROCALCITONIN: Procalcitonin: <0.1 ng/mL

## 2022-02-12 MED ORDER — ACETAMINOPHEN 325 MG PO TABS
650.0000 mg | ORAL_TABLET | Freq: Four times a day (QID) | ORAL | Status: DC | PRN
  Administered 2022-02-12 – 2022-02-13 (×4): 650 mg via ORAL
  Filled 2022-02-12 (×4): qty 2

## 2022-02-12 MED ORDER — MUPIROCIN 2 % EX OINT
1.0000 | TOPICAL_OINTMENT | Freq: Two times a day (BID) | CUTANEOUS | Status: DC
  Administered 2022-02-12 – 2022-02-14 (×4): 1 via NASAL
  Filled 2022-02-12: qty 22

## 2022-02-12 MED ORDER — PANTOPRAZOLE SODIUM 40 MG PO TBEC
40.0000 mg | DELAYED_RELEASE_TABLET | Freq: Every day | ORAL | Status: DC
  Administered 2022-02-12 – 2022-02-14 (×3): 40 mg via ORAL
  Filled 2022-02-12 (×3): qty 1

## 2022-02-12 MED ORDER — GABAPENTIN 300 MG PO CAPS
300.0000 mg | ORAL_CAPSULE | Freq: Two times a day (BID) | ORAL | Status: DC
  Administered 2022-02-12 – 2022-02-14 (×5): 300 mg via ORAL
  Filled 2022-02-12 (×5): qty 1

## 2022-02-12 NOTE — Consult Note (Signed)
PHARMACY CONSULT NOTE - FOLLOW UP  Pharmacy Consult for Electrolyte Monitoring and Replacement   Recent Labs: Potassium (mmol/L)  Date Value  02/12/2022 3.8   Magnesium (mg/dL)  Date Value  09/10/2021 2.2   Calcium (mg/dL)  Date Value  02/12/2022 8.2 (L)   Albumin (g/dL)  Date Value  02/11/2022 3.8   Phosphorus (mg/dL)  Date Value  09/08/2021 3.6   Sodium (mmol/L)  Date Value  02/12/2022 136  Corrected Ca:  9/13 9.3  Assessment: Adam Rios. is a 81 y.o. male presenting with penile pain.PMH significant for HTN, CKD, renal cell cancer, GERD, chronic pain, BPH, hx CVA, recurrent UTI with foley cath. Pharmacy has been consulted to monitor and replace electrolytes.   Goal of Therapy: Electrolytes WNL MIVF: NS KVO  Plan:  No replacement warranted at this time Will follow up with AM labs  Thank you for allowing pharmacy to be a part of this patient's care.  Lorna Dibble, PharmD, [facility] Clinical Pharmacist 02/12/2022 8:50 AM

## 2022-02-12 NOTE — TOC Initial Note (Signed)
Transition of Care (TOC) - Initial/Assessment Note    Patient Details  Name: Adam Rios. MRN: [id number] Date of Birth: 1940/06/20  Transition of Care [facility] CM/SW Contact:    Shelbie Hutching, RN Phone Number: 02/12/2022, 2:14 PM  Clinical Narrative:                 Patient admitted to the ICU for sepsis due to UTI, patient has a chronic foley.  Catheter dependent for 12 years, he said for the first 6 years he would self cath.   RNCM met with patient at the bedside, introduced self and explained role.  He is from home with his daughter and her husband and their children.  He walks with a rollator around he home and when going to appointments uses the wheelchair.  He has family to provide transportation.  He does require assistance with bathing.  He has had [facility] in the past and would like to have therapy come out to see him again.  RNCM reached out to [place] with [facility] and they are able to accept patient for RN, PT and OT at discharge.    TOC will cont to follow.  Expected Discharge Plan: [place] Barriers to Discharge: Continued Medical Work up   Patient Goals and CMS Choice Patient states their goals for this hospitalization and ongoing recovery are:: Patient wants to get strong enough to get back home CMS Medicare.gov Compare Post Acute Care list provided to:: Patient Choice offered to / list presented to : Patient  Expected Discharge Plan and Services Expected Discharge Plan: [place]   Discharge Planning Services: CM Consult Post Acute Care Choice: Home Health, Resumption of Svcs/PTA Provider Living arrangements for the past 2 months: Single Family Home                 DME Arranged: N/A DME Agency: NA       HH Arranged: RN, PT, OT HH Agency: [place] Date [place]: 02/12/22 Time HH Agency Contacted: 33 Representative spoke with at [place]: [place]  Prior Living  Arrangements/Services Living arrangements for the past 2 months: [place] with:: Adult Children, Relatives Patient language and need for interpreter reviewed:: Yes Do you feel safe going back to the place where you live?: Yes      Need for Family Participation in Patient Care: Yes (Comment) Care giver support system in place?: Yes (comment) (daughter, son in law and grand kids) Current home services: DME, Home RN Agricultural consultant, wheelchair) Criminal Activity/Legal Involvement Pertinent to Current Situation/Hospitalization: No - Comment as needed  Activities of Daily Living Home Assistive Devices/Equipment: Grab bars around toilet, Grab bars in shower, Walker (specify type) ADL Screening (condition at time of admission) Patient's cognitive ability adequate to safely complete daily activities?: Yes Is the patient deaf or have difficulty hearing?: No Does the patient have difficulty seeing, even when wearing glasses/contacts?: No Does the patient have difficulty concentrating, remembering, or making decisions?: No Patient able to express need for assistance with ADLs?: Yes Does the patient have difficulty dressing or bathing?: Yes Independently performs ADLs?: No Communication: Independent Dressing (OT): Needs assistance Is this a change from baseline?: Pre-admission baseline Grooming: Needs assistance Is this a change from baseline?: Pre-admission baseline Feeding: Independent Bathing: Needs assistance Is this a change from baseline?: Pre-admission baseline Toileting: Needs assistance Is this a change from baseline?: Pre-admission baseline In/Out Bed: Needs assistance Is  this a change from baseline?: Pre-admission baseline Walks in Home: Independent with device (comment) Is this a change from baseline?: Pre-admission baseline Does the patient have difficulty walking or climbing stairs?: Yes Weakness of Legs: Both Weakness of Arms/Hands: Both  Permission  Sought/Granted Permission sought to share information with : Case Manager, Family Supports, Other (comment) Permission granted to share information with : Yes, Verbal Permission Granted  Share Information with NAME: Kaya Klausing  Permission granted to share info w AGENCY: [facility]  Permission granted to share info w Relationship: daughter  Permission granted to share info w Contact Information: [phone]  Emotional Assessment Appearance:: Appears stated age Attitude/Demeanor/Rapport: Engaged Affect (typically observed): Accepting Orientation: : Oriented to Self, Oriented to Place, Oriented to  Time, Oriented to Situation Alcohol / Substance Use: Not Applicable Psych Involvement: No (comment)  Admission diagnosis:  Septic shock ([place]) [A41.9, R65.21] Patient Active Problem List   Diagnosis Date Noted   Septic shock ([place]) 02/11/2022   Pressure injury of skin 02/11/2022   Bacteremia 11/22/2021   Hematuria 11/21/2021   Renal cell carcinoma s/p nephrectomy, left 11/21/2021   History of recurrent UTI and ESBL UTI  11/21/2021   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   UTI (urinary tract infection) 09/09/2021   Neuropathy 09/09/2021   History of CVA (cerebrovascular accident) 09/09/2021   Fall 05/14/2021   Frailty 03/04/2018   Acute encephalopathy 01/30/2018   Chronic kidney disease 04/05/2017   GERD (gastroesophageal reflux disease) 04/05/2017   Pain 04/05/2017   Weakness 04/05/2017   Recurrent UTI 01/20/2016   Essential hypertension 07/31/2014   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

## 2022-02-12 NOTE — Progress Notes (Signed)
Updated pt's daughter Dijuan Sleeth via telephone regarding plan of care today.  All questions answered to her satisfaction.    Darel Hong, AGACNP-BC [facility] Prefer epic messenger for cross cover needs If after hours, please call E-link

## 2022-02-12 NOTE — Progress Notes (Signed)
NAME:  Adam Pettigrew., MRN:  [id number], DOB:  10-Aug-1940, LOS: 1 ADMISSION DATE:  02/11/2022, CONSULTATION DATE:  02/11/22 REFERRING MD:  Dr. Cheri Fowler, CHIEF COMPLAINT:  Penile pain with chronic foley, foul smelling urine   Brief Pt Description / Synopsis:  81 y.o. male with PMH most significant for Neurogenic bladder with chronic foley admitted with Septic Shock in setting of UTI.  History of Present Illness:  Adam Rios is an 81 year old male with a past medical history significant for recurrent UTIs due to chronic Foley, neurogenic bladder, renal cell carcinoma status post nephrectomy, CKD, BPH, hypertension, GERD, CVA, vascular dementia who presented to [facility] ED on 02/11/2022 due to complaints of penile pain with chronic Foley catheter and foul-smelling urine.  Patient reported the pain has been progressive over the last week.  He denied chest pain, shortness of breath, abdominal pain, nausea, vomiting, diarrhea, fever, chills.  Of note he was most recently seen in the ED on 01/01/22 due to complaints of suprapubic pain, low back pain, and concern that his chronic foley was blocked.  He was found to have a UTI, was given IV Rocephin and foley was exchanged.  He was discharged home from ED with plan to complete course of Cefdinir.  Of note he has also had positive urine cultures back in April 2023 growing pseudomonas,  and blood cultures with ESBL E. Coli in June 2023.  ED Course: Initial Vital Signs: Temperature 98 Fahrenheit orally, pulse 84, blood pressure 134/76, 94% on room air Significant Labs: Glucose 147, BUN 22, creatinine 1.2, WBC 11.2 with neutrophilia, lactic acid 1.9 ABG: pH 7.37/PCO2 37/PO2 97/bicarb 21.4 COVID-19 and influenza PCR negative Urinalysis is consistent with UTI (moderate leukocytes, many bacteria, WBC 21-50) Imaging Chest X-ray>>IMPRESSION: 1. Very low lung volumes. No acute cardiopulmonary abnormality. 2. Aortic Atherosclerosis (ICD10-I70.0). Calcified  postinflammatory mediastinal lymph nodes. EKG: normal sinus rhythm, no evidence of acute ischemia Medications Administered: 3.5 L of normal saline boluses, IV cefepime  He met sepsis criteria and was noted to become hypotensive therefore he was given IV fluid resuscitation and broad-spectrum IV antibiotics.  Despite fluid resuscitation he remained hypotensive which peripheral Levophed was started.  PCCM is asked to admit to ICU for further work-up and treatment.  Pertinent  Medical History   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[place])    Hypertension    Neurogenic bladder    Renal cell carcinoma ([place]) 2005   Renal disorder    Stroke ([place])     01/17/18, 12/21   Some weakness and cognitive decline    Micro Data:  9/13: SARS-CoV-2 & Influenza PCR>>negative 9/13: Blood culture x2>>NGTD 9/13: Urine>> 9/13: MRSA PCR>>Positive  Antimicrobials:  Cefepime 9/13 x1 dose Meropenem 9/13>>  Significant Hospital Events: Including procedures, antibiotic start and stop dates in addition to other pertinent events   9/13: Presents to ED due to concern with chronic Foley.  Foley catheter exchanged.  Requiring low-dose Levophed despite IV fluid resuscitation.  PCCM asked to admit 9/14: Weaned off Levophed.  Renal function improving.  Interim History / Subjective:  -No significant events noted overnight -Awake and alert, sitting up in bed eating breakfast -Afebrile, hemodynamically stable on room air ~ WEANED OFF LEVOPHED -Creatinine improved to 1.04 from 1.21, electrolytes acceptable, UOP 3.6L last 24 hrs (~ net even) -WBC normalized at 7.3 from 11.2 ~ cultures still pending -  Pt's only complaint is bilateral foot pain ~ will resume home Gabapentin  Objective   Blood pressure 128/67, pulse (!) 57, temperature 98.8 F (37.1 C), temperature source Oral, resp. rate 15,  height 6' 1"  (1.854 m), weight 103.5 kg, SpO2 96 %.        Intake/Output Summary (Last 24 hours) at 02/12/2022 8242 Last data filed at 02/12/2022 0600 Gross per 24 hour  Intake 3865.38 ml  Output 3650 ml  Net 215.38 ml    Filed Weights   02/11/22 0837 02/11/22 1259 02/12/22 0500  Weight: 105.2 kg 100.1 kg 103.5 kg    Examination: General: Acute on chronically ill-appearing male, laying in bed, on RA, no acute distress HENT: Atraumatic, normocephalic, neck supple able to assess JVD due to body habitus Lungs: Clear breath sounds bilaterally, even, nonlabored Cardiovascular: Regular rate and rhythm, S1-S2, no murmurs, rubs, gallops Abdomen: Obese, soft, nontender, nondistended, no guarding rebound tenderness, bowel sounds positive x4 Extremities: No deformities, chronic venous trophic changes of bilateral lower extremities, 1+ edema bilateral lower extremities, extremities warm Neuro: Awake and alert, oriented x3, moves all extremities to commands, no focal deficits, speech clear, pupils PERRLA GU: Foley catheter exchange in place (exchanged in ED), yellow urine  Resolved Hospital Problem list     Assessment & Plan:   Septic Shock ~ RESOLVED Mildly elevated Troponin, suspect demand ischemia HFpEF without acute exacerbation PMHx: HTN, PVD Echocardiogram 11/13/21 with LVEF >55%, grade I DD, RV normal systolic function -Continuous cardiac monitoring -Maintain MAP >65 -IV fluids (received 3.5 L NS in ED) -Vasopressors as needed to maintain MAP goal ~ weaned off -Continue Midodrine -Lactic acid has normalized (1.9 ~ 1.3) -HS Troponin peaked at 73  -Random Cortisol 6.5 -Hold home Lasix, Losartan, and Spironolactone for now  Severe Sepsis due to suspected UTI PMHx: UTI's with Chronic Foley catheter due to Neurogenic bladder (ESBL e. coli back in June 2023) -Monitor fever curve -Trend WBC's & Procalcitonin -Follow cultures as above -Continue empiric Meropenem pending cultures &  sensitivities -Chronic foley exchanged in ED 9/13 (ED provider discussed with Urology who recommended catheter exchange, pt has requested outpatient management with chronic foley, has declined suprapubic catheter placement)  AKI on CKD ~ IMPROVING BPH Neurogenic bladder PMHx: Left Renal cell carcinoma status post nephrectomy in 1984 Baseline creatinine appears to range from 0.8-1.0 as noted in June 2023 -Monitor I&O's / urinary output -Follow BMP -Ensure adequate renal perfusion -Avoid nephrotoxic agents as able -Replace electrolytes as indicated -Continue Foley catheter -Continue home Flomax  Acute Metabolic Encephalopathy in setting of sepsis and UTI ~ RESOLVED PMHx: CVA, Vascular dementia -Treatment of UTI as above -Provide supportive care -Avoid sedating meds as able -ABG normal, UDS is negative -Continue home Aricept    Best Practice (right click and "Reselect all SmartList Selections" daily)   Diet/type: Regular consistency (see orders) DVT prophylaxis: LMWH GI prophylaxis: PPI Lines: N/A Foley:  Yes, and it is still needed due to Neurogenic bladder (foley exchanged in ED 9/13) Code Status:  full code Last date of multidisciplinary goals of care discussion [02/12/22]    Labs   CBC: Recent Labs  Lab 02/11/22 0842 02/12/22 0647  WBC 11.2* 7.3  NEUTROABS 9.0*  --   HGB 12.7* 10.5*  HCT 40.5 33.7*  MCV 85.3 85.3  PLT 230 194     Basic Metabolic Panel: Recent Labs  Lab 02/11/22 0842 02/12/22 0647  NA 136 136  K 3.8 3.8  CL 106 107  CO2 22 23  GLUCOSE 147* 102*  BUN 22 14  CREATININE 1.21 1.04  CALCIUM 9.1 8.2*    GFR: Estimated Creatinine Clearance: 70.4 mL/min (by C-G formula based on SCr of 1.04 mg/dL). Recent Labs  Lab 02/11/22 0842 02/11/22 0906 02/11/22 1036 02/12/22 0647  PROCALCITON <0.10  --   --   --   WBC 11.2*  --   --  7.3  LATICACIDVEN  --  1.9 1.3  --      Liver Function Tests: Recent Labs  Lab 02/11/22 0842  AST 20   ALT 11  ALKPHOS 74  BILITOT 0.8  PROT 7.4  ALBUMIN 3.8    No results for input(s): "LIPASE", "AMYLASE" in the last 168 hours. No results for input(s): "AMMONIA" in the last 168 hours.  ABG    Component Value Date/Time   PHART 7.37 02/11/2022 1125   PCO2ART 37 02/11/2022 1125   PO2ART 97 02/11/2022 1125   HCO3 21.4 02/11/2022 1125   ACIDBASEDEF 3.4 (H) 02/11/2022 1125   O2SAT 99 02/11/2022 1125     Coagulation Profile: Recent Labs  Lab 02/11/22 0906  INR 1.2     Cardiac Enzymes: No results for input(s): "CKTOTAL", "CKMB", "CKMBINDEX", "TROPONINI" in the last 168 hours.  HbA1C: No results found for: "HGBA1C"  CBG: Recent Labs  Lab 02/11/22 1248 02/11/22 1605  GLUCAP 102* 114*    Review of Systems:   Positives in BOLD: Gen: Denies fever, chills, weight change, fatigue, night sweats HEENT: Denies blurred vision, double vision, hearing loss, tinnitus, sinus congestion, rhinorrhea, sore throat, neck stiffness, dysphagia PULM: Denies shortness of breath, cough, sputum production, hemoptysis, wheezing CV: Denies chest pain, edema, orthopnea, paroxysmal nocturnal dyspnea, palpitations GI: Denies abdominal pain, nausea, vomiting, diarrhea, hematochezia, melena, constipation, change in bowel habits GU: Denies dysuria,+penile pain with chronic urethral catheter, foul smelling urine hematuria, polyuria, oliguria, urethral discharge Endocrine: Denies hot or cold intolerance, polyuria, polyphagia or appetite change Derm: Denies rash, dry skin, scaling or peeling skin change Heme: Denies easy bruising, bleeding, bleeding gums Neuro: Denies headache, numbness, weakness, slurred speech, loss of memory or consciousness   Past Medical History:  He,  has a past medical history of Cognitive impairment, Complication of anesthesia, Hemorrhagic cerebrovascular accident (CVA) ([place]), Hypertension, Neurogenic bladder, Renal cell carcinoma ([place]) (2005), Renal disorder, and Stroke  ([place]).   Surgical History:   Past Surgical History:  Procedure Laterality Date   APPENDECTOMY     CATARACT EXTRACTION W/PHACO Right 07/22/2020   Procedure: CATARACT EXTRACTION PHACO AND INTRAOCULAR LENS PLACEMENT (IOC) RIGHT;  Surgeon: Eulogio Bear, MD;  Location: [place];  Service: Ophthalmology;  Laterality: Right;  4.77 0:44.2   CATARACT EXTRACTION W/PHACO Left 08/05/2020   Procedure: CATARACT EXTRACTION PHACO AND INTRAOCULAR LENS PLACEMENT (IOC) LEFT 10.73 01:13.8;  Surgeon: Eulogio Bear, MD;  Location: [place];  Service: Ophthalmology;  Laterality: Left;  Use eye stretcher not chair   HERNIA REPAIR     LUMBAR FUSION     NEPHRECTOMY Left 2005     Social History:   reports that he quit smoking about 18 years ago. His smoking use included cigarettes. He has a 2.50 pack-year smoking history. He has never used smokeless tobacco. He reports that he does not currently use alcohol. He reports that he does not use drugs.   Family History:  His family history is not on file.   Allergies Allergies  Allergen Reactions   Fentanyl Nausea Only   Oxycodone Nausea And Vomiting  Home Medications  Prior to Admission medications   Medication Sig Start Date End Date Taking? Authorizing Provider  acetaminophen (TYLENOL) 325 MG tablet Take 500 mg by mouth every 6 (six) hours as needed. 08/14/14   [provider]  aspirin 81 MG EC tablet Take 81 mg by mouth daily.    [provider]  atorvastatin (LIPITOR) 40 MG tablet Take 40 mg by mouth daily.    [provider]  cefdinir (OMNICEF) 300 MG capsule Take 1 capsule (300 mg total) by mouth 2 (two) times daily. 01/01/22   Carrie Mew, MD  diclofenac Sodium (VOLTAREN) 1 % GEL Apply 2 g topically 2 (two) times daily. 05/11/21   [provider]  donepezil (ARICEPT) 5 MG tablet Take 5 mg by mouth at bedtime.    [provider]  furosemide (LASIX) 40 MG tablet Take 1  tablet (40 mg total) by mouth 2 (two) times daily. 11/28/21   Sidney Ace, MD  gabapentin (NEURONTIN) 300 MG capsule Take 1 capsule (300 mg total) by mouth 2 (two) times daily. 11/28/21   Sreenath, Sudheer B, MD  lidocaine (LIDODERM) 5 % Place 2 patches onto the skin daily. 09/04/21   [provider]  losartan (COZAAR) 25 MG tablet Take 1 tablet (25 mg total) by mouth daily. 11/28/21 01/27/22  Sidney Ace, MD  mirabegron ER (MYRBETRIQ) 25 MG TB24 tablet Take 25 mg by mouth daily.    [provider]  omeprazole (PRILOSEC) 20 MG capsule Take 1 capsule (20 mg total) by mouth daily. 05/21/21   Jennye Boroughs, MD  ondansetron (ZOFRAN-ODT) 4 MG disintegrating tablet Take 1 tablet (4 mg total) by mouth every 6 (six) hours as needed for nausea or vomiting. 11/15/21   Ward, Delice Bison, DO  senna-docusate (SENOKOT-S) 8.6-50 MG tablet Take 1 tablet by mouth 2 (two) times daily. 09/10/21   Dwyane Dee, MD  spironolactone (ALDACTONE) 25 MG tablet Take 25 mg by mouth every morning. 03/08/21   [provider]  tamsulosin (FLOMAX) 0.4 MG CAPS capsule Take 0.4 mg by mouth daily.    [provider]  traZODone (DESYREL) 50 MG tablet Take 50-100 mg by mouth at bedtime. 08/12/21   [provider]  vitamin B-12 (CYANOCOBALAMIN) 1000 MCG tablet Take 1,000 mcg by mouth daily.    [provider]     Critical care time: 38 minutes     Darel Hong, AGACNP-BC [facility] [place] epic messenger for cross cover needs If after hours, please call E-link

## 2022-02-13 ENCOUNTER — Inpatient Hospital Stay: Payer: Medicare Other

## 2022-02-13 DIAGNOSIS — N3090 Cystitis, unspecified without hematuria: Secondary | ICD-10-CM

## 2022-02-13 DIAGNOSIS — T83518A Infection and inflammatory reaction due to other urinary catheter, initial encounter: Secondary | ICD-10-CM | POA: Diagnosis not present

## 2022-02-13 DIAGNOSIS — N342 Other urethritis: Secondary | ICD-10-CM

## 2022-02-13 DIAGNOSIS — A419 Sepsis, unspecified organism: Secondary | ICD-10-CM | POA: Diagnosis not present

## 2022-02-13 DIAGNOSIS — R6521 Severe sepsis with septic shock: Secondary | ICD-10-CM | POA: Diagnosis not present

## 2022-02-13 LAB — CBC
HCT: 35.6 % — ABNORMAL LOW (ref 39.0–52.0)
Hemoglobin: 11.1 g/dL — ABNORMAL LOW (ref 13.0–17.0)
MCH: 26.5 pg (ref 26.0–34.0)
MCHC: 31.2 g/dL (ref 30.0–36.0)
MCV: 85 fL (ref 80.0–100.0)
Platelets: 186 10*3/uL (ref 150–400)
RBC: 4.19 MIL/uL — ABNORMAL LOW (ref 4.22–5.81)
RDW: 14.6 % (ref 11.5–15.5)
WBC: 7.8 10*3/uL (ref 4.0–10.5)
nRBC: 0 % (ref 0.0–0.2)

## 2022-02-13 LAB — URINE CULTURE: Culture: >=100000 — AB

## 2022-02-13 LAB — BASIC METABOLIC PANEL
Anion gap: 6 (ref 5–15)
BUN: 13 mg/dL (ref 8–23)
CO2: 23 mmol/L (ref 22–32)
Calcium: 8.4 mg/dL — ABNORMAL LOW (ref 8.9–10.3)
Chloride: 108 mmol/L (ref 98–111)
Creatinine, Ser: 0.99 mg/dL (ref 0.61–1.24)
GFR, Estimated: >60 mL/min (ref >60)
Glucose, Bld: 88 mg/dL (ref 70–99)
Potassium: 3.7 mmol/L (ref 3.5–5.1)
Sodium: 137 mmol/L (ref 135–145)

## 2022-02-13 LAB — PROCALCITONIN: Procalcitonin: <0.1 ng/mL

## 2022-02-13 LAB — MAGNESIUM: Magnesium: 1.9 mg/dL (ref 1.7–2.4)

## 2022-02-13 LAB — PHOSPHORUS: Phosphorus: 2.8 mg/dL (ref 2.5–4.6)

## 2022-02-13 MED ORDER — SULFAMETHOXAZOLE-TRIMETHOPRIM 800-160 MG PO TABS
1.0000 | ORAL_TABLET | Freq: Two times a day (BID) | ORAL | Status: DC
  Administered 2022-02-13 – 2022-02-14 (×2): 1 via ORAL
  Filled 2022-02-13 (×2): qty 1

## 2022-02-13 MED ORDER — MIDODRINE HCL 5 MG PO TABS
5.0000 mg | ORAL_TABLET | Freq: Three times a day (TID) | ORAL | Status: DC
  Administered 2022-02-13 – 2022-02-14 (×3): 5 mg via ORAL
  Filled 2022-02-13 (×4): qty 1

## 2022-02-13 MED ORDER — ENOXAPARIN SODIUM 40 MG/0.4ML IJ SOSY
40.0000 mg | PREFILLED_SYRINGE | INTRAMUSCULAR | Status: DC
  Administered 2022-02-13: 40 mg via SUBCUTANEOUS
  Filled 2022-02-13: qty 0.4

## 2022-02-13 NOTE — Progress Notes (Signed)
Patient transferred to room 216 in hospital bed and on cardiac monitor.  Report given to [facility] prior to patient arrival.  Patient is in stable condition.

## 2022-02-13 NOTE — Progress Notes (Addendum)
PROGRESS NOTE    Adam Rios.  ERD:[id number] DOB: May 24, 1941 DOA: 02/11/2022 PCP: Dola Argyle, MD  [id number]  LOS: 2 days   Brief hospital course: No notes on file  Assessment & Plan: Adam Rios is an 81 year old male with a past medical history significant for recurrent UTIs due to chronic Foley, neurogenic bladder, renal cell carcinoma status post nephrectomy, CKD, BPH, hypertension, GERD, CVA, vascular dementia who presented to [facility] ED on 02/11/2022 due to complaints of penile pain with chronic Foley catheter and foul-smelling urine.  Patient reported the pain has been progressive over the last week.  He denied chest pain, shortness of breath, abdominal pain, nausea, vomiting, diarrhea, fever, chills.   Of note he was most recently seen in the ED on 01/01/22 due to complaints of suprapubic pain, low back pain, and concern that his chronic foley was blocked.  He was found to have a UTI, was given IV Rocephin and foley was exchanged.  He was discharged home from ED with plan to complete course of Cefdinir.  Of note he has also had positive urine cultures back in April 2023 growing pseudomonas,  and blood cultures with ESBL E. Coli in June 2023.  He met sepsis criteria and was noted to become hypotensive therefore he was given IV fluid resuscitation and broad-spectrum IV antibiotics.  Despite fluid resuscitation he remained hypotensive which peripheral Levophed was started.  PCCM is asked to admit to ICU for further work-up and treatment.   Septic shock 2/2 UTI --weaned off Levophed on 9/14 --taper midodrine down to 5 mg TID  UTI 2/2 Chronic Foley catheter due to Neurogenic bladder (ESBL e. coli back in June 2023) -Chronic foley exchanged in ED 9/13 (ED provider discussed with Urology who recommended catheter exchange, pt has requested outpatient management with chronic foley, has declined suprapubic catheter placement) --started on meropenem --urine cx pos for ESBL E coli and  MRSA Plan: --cont Meropenem for now --ID consult for abx rec   AKI, ruled out CKD2 --Cr 1.21 on presentation.  Baseline around 1.  Did not meet criteria for AKI.   Acute Metabolic Encephalopathy in setting of sepsis and UTI ~ RESOLVED Hx of CVA, Vascular dementia -ABG normal, UDS is negative -Continue home Aricept  Mildly elevated Troponin, suspect demand ischemia -HS Troponin peaked at 73   Chronic diastolic CHF without acute exacerbation -Hold home Lasix, Losartan, and Spironolactone for now  Left Renal cell carcinoma status post nephrectomy in 1984  HTN -Hold home Lasix, Losartan, and Spironolactone for now  PVD --cont ASA and statin   DVT prophylaxis: Lovenox SQ Code Status: Full code  Family Communication:  Level of care: Med-Surg Dispo:   The patient is from: home Anticipated d/c is to: home Anticipated d/c date is: 1-2 days   Subjective and Interval History:  Pt denied pain, no dyspnea.  Ate well.   Objective: Vitals:   02/13/22 1200 02/13/22 1220 02/13/22 1300 02/13/22 1454  BP: (!) 81/40 112/64 117/68 128/76  Pulse: 75 71 82 70  Resp: 17 (!) 25 20 16   Temp:  98.5 F (36.9 C)  98.3 F (36.8 C)  TempSrc:  Oral    SpO2: 95% 94% 94% 97%  Weight:      Height:        Intake/Output Summary (Last 24 hours) at 02/13/2022 1701 Last data filed at 02/13/2022 1249 Gross per 24 hour  Intake 200 ml  Output 2700 ml  Net -2500 ml   [employer]  02/11/22 1259 02/12/22 0500 02/13/22 0500  Weight: 100.1 kg 103.5 kg 100 kg    Examination:   Constitutional: NAD, AAOx3 HEENT: conjunctivae and lids normal, EOMI CV: No cyanosis.   RESP: normal respiratory effort, on RA Neuro: II - XII grossly intact.   Psych: Normal mood and affect.  Appropriate judgement and reason Foley present with clear urine   Data Reviewed: I have personally reviewed labs and imaging studies  Time spent: 50 minutes  Enzo Bi, MD [facility] If 7PM-7AM, please  contact night-coverage 02/13/2022, 5:01 PM

## 2022-02-13 NOTE — Consult Note (Signed)
NAME: Adam Rios.  DOB: [birth year]  MRN: [id number]  Date/Time: 02/13/2022 1:21 PM  REQUESTING PROVIDER: Dr.Lai Subjective:  REASON FOR CONSULT: UTI ? Tavarus [place]. is a 81 y.o. 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  Temp 98 F (36.7 C)  Pulse Rate 56 !  Resp 14  SpO2 91 %  O2 Flow Rate (L/min) 3 L/min  Weight 220 lb 10.9 oz    Latest Reference Range & Units 02/11/22  WBC 4.0 - 10.5 K/uL 11.2 (H)  Hemoglobin 13.0 - 17.0 g/dL 12.7 (L)  HCT 39.0 - 52.0 % 40.5  Platelets 150 - 400 K/uL 230  Creatinine 0.61 - 1.24 mg/dL 1.21   Admitted to ICU for hypotesnion. Foley catheter exchanged in the ED Given 3Lfluids and then started on levophed as no response Had 100K ESBL e.coli and 30 K MRSA Blood culture NG Currently on meropenem He denies any dizziness, chest pain, sob, fever before he came to the hopsital He walks with a rollator He says he had lumbar fusion surgery a few years ago and since then has had difficulty in passing urine and has been self cath and now foley HE also has an enlarged prostate of 245 gms and his urologist at [facility] is planning to Do [place]    Past Surgical History:  Procedure Laterality Date   APPENDECTOMY     CATARACT EXTRACTION W/PHACO Right 07/22/2020   Procedure: CATARACT EXTRACTION PHACO AND INTRAOCULAR LENS PLACEMENT ([place]) RIGHT;  Surgeon: Eulogio Bear, MD;  Location: [place];  Service: Ophthalmology;   Laterality: Right;  4.77 0:44.2   CATARACT EXTRACTION W/PHACO Left 08/05/2020   Procedure: CATARACT EXTRACTION PHACO AND INTRAOCULAR LENS PLACEMENT ([facility]) LEFT 10.73 01:13.8;  Surgeon: Eulogio Bear, MD;  Location: [place];  Service: Ophthalmology;  Laterality: Left;  Use eye stretcher not chair   HERNIA REPAIR     LUMBAR FUSION     NEPHRECTOMY Left 2005    Social History   Socioeconomic History   Marital status: Divorced    Spouse name: Not on file   Number of children: Not on file   Years of education: Not on file   Highest education level: Not on file  Occupational History   Not on file  Tobacco Use   Smoking status: Former    Packs/day: 0.25    Years: 10.00    Total pack years: 2.50    Types: Cigarettes    Quit date: 2005    Years since quitting: 18.7   Smokeless tobacco: Never  Vaping Use   Vaping Use: Never used  Substance and Sexual Activity   Alcohol use: Not Currently   Drug use: Never   Sexual activity: Not on file  Other Topics Concern   Not on file  Social History Narrative   Not on file   Social Determinants of Health   Financial Resource Strain: Not on file  Food Insecurity: No Food Insecurity (02/11/2022)   Hunger Vital Sign    Worried About Running Out of Food in the Last Year: Never true    Ran Out of Food in the Last Year: Never true  Transportation Needs: No Transportation Needs (02/11/2022)   PRAPARE - Hydrologist (Medical): No    Lack of Transportation (Non-Medical): No  Physical Activity: Not on file  Stress: Not on file  Social Connections: Not on file  Intimate Partner Violence: Not At Risk (02/11/2022)   Humiliation, Afraid, Rape, and Kick questionnaire    Fear of Current or Ex-Partner: No    Emotionally Abused: No    Physically Abused: No    Sexually Abused: No    History reviewed. No pertinent family history. Allergies  Allergen Reactions   Fentanyl Nausea Only   Oxycodone Nausea And  Vomiting   I? Current Facility-Administered Medications  Medication Dose Route Frequency Provider Last Rate Last Admin   0.9 %  sodium chloride infusion  250 mL Intravenous Continuous Coulter, Carolyn, RPH       acetaminophen (TYLENOL) tablet 650 mg  650 mg Oral Q6H PRN Darel Hong D, NP   650 mg at 02/13/22 0501   aspirin EC tablet 81 mg  81 mg Oral Daily Darel Hong D, NP   81 mg at 02/13/22 1008   atorvastatin (LIPITOR) tablet 40 mg  40 mg Oral QPM Darel Hong D, NP   40 mg at 02/12/22 1720   Chlorhexidine Gluconate Cloth 2 % PADS 6 each  6 each Topical Daily Flora Lipps, MD   6 each at 02/12/22 0830   docusate sodium (COLACE) capsule 100 mg  100 mg Oral BID PRN Bradly Bienenstock, NP       enoxaparin (LOVENOX) injection 40 mg  40 mg Subcutaneous Q24H Lockie Mola B, RPH       gabapentin (NEURONTIN) capsule 300 mg  300 mg Oral BID Darel Hong D, NP   300 mg at 02/13/22 1008   meropenem (MERREM) 1 g in sodium chloride 0.9 % 100 mL IVPB  1 g Intravenous Q8H Flora Lipps, MD   Stopped at 02/13/22 0534   midodrine (PROAMATINE) tablet 5 mg  5 mg Oral TID WC Enzo Bi, MD   5 mg at 02/13/22 1221   mupirocin ointment (BACTROBAN) 2 % 1 Application  1 Application Nasal BID Flora Lipps, MD   1 Application at 84/66/59 1000   pantoprazole (PROTONIX) EC tablet 40 mg  40 mg Oral Daily Darel Hong D, NP   40 mg at 02/13/22 1008   polyethylene glycol (MIRALAX / GLYCOLAX) packet 17 g  17 g Oral Daily PRN Bradly Bienenstock, NP         Abtx:  Anti-infectives (From admission, onward)    Start     Dose/Rate Route Frequency Ordered Stop   02/11/22 1200  meropenem (MERREM) 1 g in sodium chloride 0.9 % 100 mL IVPB        1 g 200 mL/hr over 30 Minutes Intravenous Every 8 hours 02/11/22 1146     02/11/22 0900  ceFEPIme (MAXIPIME) 2 g in sodium chloride 0.9 % 100 mL IVPB        2 g 200 mL/hr over 30 Minutes Intravenous  Once 02/11/22 0854 02/11/22 0953       REVIEW OF SYSTEMS:   Const: negative fever, negative chills, negative weight loss Eyes: negative diplopia or visual changes, negative eye pain ENT:  negative coryza, negative sore throat Resp: negative cough, hemoptysis, dyspnea Cards: negative for chest pain, palpitations, lower extremity edema GU: as above GI: Negative for abdominal pain, diarrhea, bleeding, constipation Skin: negative for rash and pruritus Heme: negative for easy bruising and gum/nose bleeding MS: negative for myalgias, arthralgias, back pain and muscle weakness Neurolo:negative for headaches, dizziness, vertigo, memory problems  Psych: negative for feelings of anxiety, depression  Endocrine: negative for thyroid, diabetes Allergy/Immunology- negative for any medication or food allergies ? Pertinent Positives include : Objective:  VITALS:  Patient Vitals for the past 24 hrs:  BP Temp Temp src Pulse Resp SpO2 Weight  02/13/22 1454 128/76 98.3 F (36.8 C) -- 70 16 97 % --  02/13/22 1300 117/68 -- -- 82 20 94 % --  02/13/22 1220 112/64 98.5 F (36.9 C) Oral 71 (!) 25 94 % --  02/13/22 1200 (!) 81/40 -- -- 75 17 95 % --  02/13/22 1100 98/63 -- -- (!) 58 14 98 % --  02/13/22 1000 (!) 125/48 -- -- 61 18 94 % --  02/13/22 0900 127/72 -- -- 61 15 92 % --  02/13/22 0800 111/75 98.5 F (36.9 C) Oral (!) 53 15 91 % --  02/13/22 0701 -- -- -- (!) 57 (!) 7 95 % --  02/13/22 0700 127/72 -- -- (!) 57 13 93 % --  02/13/22 0600 112/66 -- -- (!) 52 15 90 % --  02/13/22 0500 124/68 -- -- (!) 56 11 94 % 100 kg  02/13/22 0400 129/62 -- -- (!) 59 17 94 % --  02/13/22 0300 110/69 -- -- (!) 57 16 92 % --  02/13/22 0200 109/66 98.4 F (36.9 C) Oral (!) 55 15 93 % --  02/13/22 0100 119/68 -- -- (!) 55 12 93 % --  02/12/22 2200 124/71 -- -- (!) 58 16 94 % --  02/12/22 2100 129/64 -- -- 61 15 95 % --  02/12/22 2000 124/66 98 F (36.7 C) Oral (!) 55 14 93 % --  02/12/22 1933 125/73 -- -- 64 17 93 % --  02/12/22 1900 -- -- -- 61 17 97 % --     LDA Foley PHYSICAL EXAM:  General: Alert, cooperative, no distress, appears stated age.  Head: Normocephalic, without obvious abnormality, atraumatic. Eyes: Conjunctivae clear, anicteric sclerae. Pupils are equal ENT Nares normal. No drainage or sinus tenderness. Lips, mucosa, and tongue normal. No Thrush Neck: Supple, symmetrical, no adenopathy, thyroid: non tender no carotid bruit and no JVD. Back: No CVA tenderness. Lungs: b/l air entry Few basal crepts Heart: Regular rate and rhythm, no murmur, rub or gallop. Abdomen: Soft, non-tender,not distended. Bowel sounds normal. No masses Foley No trauma at the tip -minimal purulent discharge Extremities: atraumatic, no cyanosis. No edema. No clubbing Skin: No rashes or lesions. Or bruising Lymph: Cervical, supraclavicular normal. Neurologic: Grossly non-focal Pertinent Labs Lab Results CBC    Component Value Date/Time   WBC 7.8 02/13/2022 0702   RBC 4.19 (L) 02/13/2022 0702   HGB 11.1 (L) 02/13/2022 0702   HCT 35.6 (L) 02/13/2022 0702   PLT 186 02/13/2022 0702   MCV 85.0 02/13/2022 0702   MCH 26.5 02/13/2022 0702   MCHC 31.2 02/13/2022 0702   RDW 14.6 02/13/2022 0702   LYMPHSABS 1.2 02/11/2022 0842   MONOABS 0.9 02/11/2022 0842   EOSABS 0.1 02/11/2022 0842   BASOSABS 0.0 02/11/2022 0842       Latest Ref Rng & Units 02/13/2022  7:02 AM 02/12/2022    6:47 AM 02/11/2022    8:42 AM  CMP  Glucose 70 - 99 mg/dL 88  102  147   BUN 8 - 23 mg/dL '13  14  22   '$ Creatinine 0.61 - 1.24 mg/dL 0.99  1.04  1.21   Sodium 135 - 145 mmol/L 137  136  136   Potassium 3.5 - 5.1 mmol/L 3.7  3.8  3.8   Chloride 98 - 111 mmol/L 108  107  106   CO2 22 - 32 mmol/L '23  23  22   '$ Calcium 8.9 - 10.3 mg/dL 8.4  8.2  9.1   Total Protein 6.5 - 8.1 g/dL   7.4   Total Bilirubin 0.3 - 1.2 mg/dL   0.8   Alkaline Phos 38 - 126 U/L   74   AST 15 - 41 U/L   20   ALT 0 - 44 U/L   11       Microbiology: Recent Results (from the past 240 hour(s))   Resp Panel by RT-PCR (Flu A&B, Covid) Anterior Nasal Swab     Status: None   Collection Time: 02/11/22  9:06 AM   Specimen: Anterior Nasal Swab  Result Value Ref Range Status   SARS Coronavirus 2 by RT PCR NEGATIVE NEGATIVE Final    Comment: (NOTE) SARS-CoV-2 target nucleic acids are NOT DETECTED.  The SARS-CoV-2 RNA is generally detectable in upper respiratory specimens during the acute phase of infection. The lowest concentration of SARS-CoV-2 viral copies this assay can detect is 138 copies/mL. A negative result does not preclude SARS-Cov-2 infection and should not be used as the sole basis for treatment or other patient management decisions. A negative result may occur with  improper specimen collection/handling, submission of specimen other than nasopharyngeal swab, presence of viral mutation(s) within the areas targeted by this assay, and inadequate number of viral copies(<138 copies/mL). A negative result must be combined with clinical observations, patient history, and epidemiological information. The expected result is Negative.  Fact Sheet for Patients:  EntrepreneurPulse.com.au  Fact Sheet for Healthcare Providers:  IncredibleEmployment.be  This test is no t yet approved or cleared by the [place] FDA and  has been authorized for detection and/or diagnosis of SARS-CoV-2 by FDA under an Emergency Use Authorization (EUA). This EUA will remain  in effect (meaning this test can be used) for the duration of the COVID-19 declaration under Section 564(b)(1) of the Act, 21 U.S.C.section 360bbb-3(b)(1), unless the authorization is terminated  or revoked sooner.       Influenza A by PCR NEGATIVE NEGATIVE Final   Influenza B by PCR NEGATIVE NEGATIVE Final    Comment: (NOTE) The Xpert Xpress SARS-CoV-2/FLU/RSV plus assay is intended as an aid in the diagnosis of influenza from Nasopharyngeal swab specimens and should not be used as a  sole basis for treatment. Nasal washings and aspirates are unacceptable for Xpert Xpress SARS-CoV-2/FLU/RSV testing.  Fact Sheet for Patients: EntrepreneurPulse.com.au  Fact Sheet for Healthcare Providers: IncredibleEmployment.be  This test is not yet approved or cleared by the [place] FDA and has been authorized for detection and/or diagnosis of SARS-CoV-2 by FDA under an Emergency Use Authorization (EUA). This EUA will remain in effect (meaning this test can be used) for the duration of the COVID-19 declaration under Section 564(b)(1) of the Act, 21 U.S.C. section 360bbb-3(b)(1), unless the authorization is terminated or revoked.  Performed at [facility], [address], [place], [place] [address]  Blood Culture (routine x 2)     Status: None (Preliminary result)   Collection Time: 02/11/22  9:06 AM   Specimen: BLOOD RIGHT ARM  Result Value Ref Range Status   Specimen Description BLOOD RIGHT ARM  Final   Special Requests   Final    BOTTLES DRAWN AEROBIC AND ANAEROBIC Blood Culture results may not be optimal due to an excessive volume of blood received in culture bottles   Culture   Final    NO GROWTH 2 DAYS Performed at [facility], [address], [place], [place] [address]    Report Status PENDING  Incomplete  Blood Culture (routine x 2)     Status: None (Preliminary result)   Collection Time: 02/11/22  9:06 AM   Specimen: BLOOD LEFT ARM  Result Value Ref Range Status   Specimen Description BLOOD LEFT ARM  Final   Special Requests   Final    BOTTLES DRAWN AEROBIC AND ANAEROBIC Blood Culture adequate volume   Culture   Final    NO GROWTH 2 DAYS Performed at [facility], [address] [place], [place] [address]    Report Status PENDING  Incomplete  Urine Culture     Status: Abnormal   Collection Time: 02/11/22 10:34 AM   Specimen: In/Out Cath Urine  Result Value Ref Range Status   Specimen  Description   Final    IN/OUT CATH URINE Performed at [facility], [address] [place], [place] [address]    Special Requests   Final    NONE Performed at [facility], [address] [place], [place] [address]    Culture (A)  Final    >=100,000 COLONIES/mL ESCHERICHIA COLI 30,000 COLONIES/mL METHICILLIN RESISTANT STAPHYLOCOCCUS AUREUS Confirmed Extended Spectrum Beta-Lactamase Producer (ESBL).  In bloodstream infections from ESBL organisms, carbapenems are preferred over piperacillin/tazobactam. They are shown to have a lower risk of mortality.    Report Status 02/13/2022 FINAL  Final   Organism ID, Bacteria ESCHERICHIA COLI (A)  Final   Organism ID, Bacteria METHICILLIN RESISTANT STAPHYLOCOCCUS AUREUS (A)  Final      Susceptibility   Escherichia coli - MIC*    AMPICILLIN >=32 RESISTANT Resistant     CEFAZOLIN >=64 RESISTANT Resistant     CEFEPIME 16 RESISTANT Resistant     CEFTRIAXONE >=64 RESISTANT Resistant     CIPROFLOXACIN >=4 RESISTANT Resistant     GENTAMICIN >=16 RESISTANT Resistant     IMIPENEM <=0.25 SENSITIVE Sensitive     NITROFURANTOIN <=16 SENSITIVE Sensitive     TRIMETH/SULFA <=20 SENSITIVE Sensitive     AMPICILLIN/SULBACTAM >=32 RESISTANT Resistant     PIP/TAZO 8 SENSITIVE Sensitive     * >=100,000 COLONIES/mL ESCHERICHIA COLI   Methicillin resistant staphylococcus aureus - MIC*    CIPROFLOXACIN >=8 RESISTANT Resistant     GENTAMICIN <=0.5 SENSITIVE Sensitive     NITROFURANTOIN <=16 SENSITIVE Sensitive     OXACILLIN >=4 RESISTANT Resistant     TETRACYCLINE <=1 SENSITIVE Sensitive     VANCOMYCIN 1 SENSITIVE Sensitive     TRIMETH/SULFA <=10 SENSITIVE Sensitive     CLINDAMYCIN <=0.25 SENSITIVE Sensitive     RIFAMPIN <=0.5 SENSITIVE Sensitive     Inducible Clindamycin NEGATIVE Sensitive     * 30,000 COLONIES/mL METHICILLIN RESISTANT STAPHYLOCOCCUS AUREUS  MRSA Next Gen by PCR, Nasal     Status: Abnormal   Collection Time: 02/11/22   1:04 PM   Specimen: Nasal Mucosa; Nasal Swab  Result Value Ref Range  Status   MRSA by PCR Next Gen DETECTED (A) NOT DETECTED Final    Comment: RESULT CALLED TO, READ BACK BY AND VERIFIED WITH: UTE JACOBS 02/11/22 1459 MU (NOTE) The GeneXpert MRSA Assay (FDA approved for NASAL specimens only), is one component of a comprehensive MRSA colonization surveillance program. It is not intended to diagnose MRSA infection nor to guide or monitor treatment for MRSA infections. Test performance is not FDA approved in patients less than 7 years old. Performed at [facility], [address] [place], [place] [address]     IMAGING RESULTS:  I have personally reviewed the films ?low lung volumes  Impression/Recommendation ? ?pt presented with burning /pain  penile urethra with minimal hematuria On presentation no fever, normal lactate and procal- had hypotension- unclear etiology  Received fluids and levophed- cortisol 6.5 Does not fit sepsis criteria  Local urethritis and cystitis  E.coli > 100 colonies On Meropenem Also has 30 K of MRSA lUS done today no evidence of single kidney involvement with pyelo or hydro Change meropenem to bactrim for 3 days only ( watch closely K and cr) HE needs to discuss with his urologist at [facility] regardign possible [facility]-   Neurogenic bladder following lumbar fusion surgery many years ago  BPH Followed at [facility]  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 ___________________________________________________ Discussed with patient, and care team Note:  This document was prepared using Dragon voice recognition software and may include unintentional dictation errors.

## 2022-02-13 NOTE — Progress Notes (Signed)
PHARMACIST - PHYSICIAN COMMUNICATION  CONCERNING:  Enoxaparin (Lovenox) for DVT Prophylaxis    RECOMMENDATION: Patient was prescribed enoxaparin 0.5 mg/kg q24 hours for VTE prophylaxis.   Filed Weights   02/11/22 1259 02/12/22 0500 02/13/22 0500  Weight: 100.1 kg (220 lb 10.9 oz) 103.5 kg (228 lb 2.8 oz) 100 kg (220 lb 7.4 oz)    Body mass index is 29.09 kg/m.  Estimated Creatinine Clearance: 72.8 mL/min (by C-G formula based on SCr of 0.99 mg/dL).   Based on [place] patient is candidate for enoxaparin 40 mg SQ every 24 hours based on BMI being < 30.  DESCRIPTION: Pharmacy has adjusted enoxaparin dose per [facility] policy.  Patient is now receiving enoxaparin 40 mg every 24 hours   Benita Gutter 02/13/2022 8:26 AM

## 2022-02-13 NOTE — Plan of Care (Signed)
Continuing with plan of care. 

## 2022-02-14 DIAGNOSIS — A419 Sepsis, unspecified organism: Secondary | ICD-10-CM | POA: Diagnosis not present

## 2022-02-14 DIAGNOSIS — R6521 Severe sepsis with septic shock: Secondary | ICD-10-CM | POA: Diagnosis not present

## 2022-02-14 DIAGNOSIS — T83518A Infection and inflammatory reaction due to other urinary catheter, initial encounter: Secondary | ICD-10-CM | POA: Diagnosis not present

## 2022-02-14 LAB — BASIC METABOLIC PANEL
Anion gap: 7 (ref 5–15)
BUN: 17 mg/dL (ref 8–23)
CO2: 22 mmol/L (ref 22–32)
Calcium: 8.7 mg/dL — ABNORMAL LOW (ref 8.9–10.3)
Chloride: 108 mmol/L (ref 98–111)
Creatinine, Ser: 0.92 mg/dL (ref 0.61–1.24)
GFR, Estimated: >60 mL/min (ref >60)
Glucose, Bld: 92 mg/dL (ref 70–99)
Potassium: 3.6 mmol/L (ref 3.5–5.1)
Sodium: 137 mmol/L (ref 135–145)

## 2022-02-14 LAB — CBC
HCT: 36.1 % — ABNORMAL LOW (ref 39.0–52.0)
Hemoglobin: 11.3 g/dL — ABNORMAL LOW (ref 13.0–17.0)
MCH: 26.6 pg (ref 26.0–34.0)
MCHC: 31.3 g/dL (ref 30.0–36.0)
MCV: 84.9 fL (ref 80.0–100.0)
Platelets: 183 10*3/uL (ref 150–400)
RBC: 4.25 MIL/uL (ref 4.22–5.81)
RDW: 14.6 % (ref 11.5–15.5)
WBC: 7 10*3/uL (ref 4.0–10.5)
nRBC: 0 % (ref 0.0–0.2)

## 2022-02-14 LAB — MAGNESIUM: Magnesium: 2 mg/dL (ref 1.7–2.4)

## 2022-02-14 MED ORDER — FUROSEMIDE 40 MG PO TABS: ORAL_TABLET | ORAL | Status: DC

## 2022-02-14 MED ORDER — LOSARTAN POTASSIUM 25 MG PO TABS: ORAL_TABLET | ORAL | 0 refills | Status: DC

## 2022-02-14 MED ORDER — SULFAMETHOXAZOLE-TRIMETHOPRIM 800-160 MG PO TABS: 1.0000 | ORAL_TABLET | Freq: Two times a day (BID) | ORAL | 0 refills | Status: AC

## 2022-02-14 MED ORDER — SPIRONOLACTONE 25 MG PO TABS: ORAL_TABLET | ORAL | Status: DC

## 2022-02-14 NOTE — TOC Transition Note (Addendum)
Transition of Care [facility]) - CM/SW Discharge Note   Patient Details  Name: Adam Rios. MRN: [id number] Date of Birth: [birth year]  Transition of Care [facility]) CM/SW Contact:  [address], [place] Phone Number: 02/14/2022, 10:18 AM   Clinical Narrative:    Update: CSW spoke with pt's daughter to advise pt ready to dc, she states she had no idea pt was ready to go, states she can't get here till at least 5 pm. CSW advised [place] for [facility], she states she wants pt back with [facility]. CSW spoke with Corene Cornea at [place], requested [place] resume, Corene Cornea confirmed pt was active and would resume. CSW let [place] at [place] know to cancel.   Pt ready to dc, CSW reached out to [place] at [place], advised pt will be resuming [place] services.     Barriers to Discharge: Continued Medical Work up   Patient Goals and CMS Choice Patient states their goals for this hospitalization and ongoing recovery are:: Patient wants to get strong enough to get back home CMS Medicare.gov Compare Post Acute Care list provided to:: Patient Choice offered to / list presented to : Patient  Discharge Placement                       Discharge Plan and Services   Discharge Planning Services: CM Consult Post Acute Care Choice: Home Health, Resumption of Svcs/PTA Provider          DME Arranged: N/A DME Agency: NA       HH Arranged: RN, PT, OT HH Agency: [place] Date HH Agency Contacted: 02/12/22 Time [place]: 4 Representative spoke with at [place]: [place]  Social Determinants of Health ([place]) Interventions Housing Interventions: Intervention Not Indicated   Readmission Risk Interventions    02/12/2022    2:17 PM  Readmission Risk Prevention Plan  Transportation Screening Complete  PCP or Specialist Appt within 3-5 Days Complete  HRI or [place] Complete  Social Work Consult for [place] Planning/Counseling Complete  Palliative Care Screening Not  Applicable  Medication Review Press photographer) Referral to Pharmacy

## 2022-02-14 NOTE — Evaluation (Signed)
Physical Therapy Evaluation Patient Details Name: Adam Rios. MRN: [id number] DOB: 08/27/1940 Today's Date: 02/14/2022  History of Present Illness  presented to ER secondary to penile pain, foul-smelling urine; admitted for management of septic shock secondary to UTI.  Also noted with mild elevation in troponin (peak 73), demand ischemia per notes.  Clinical Impression  Patient resting in bed upon arrival; alert and oriented, follows commands and eager for OOB activities. Denies pain at this time.  Bilat UE/LE strength and ROM grossly symmetrical and WFL for basic transfers and gait; no focal weakness appreciated.  Able to complete bed mobility with mod assist; sit/stand, mod assist (extensive assist for lift off, anterior weight translation and initial stabilization); basic transfers and gait (35') with RW, min assist.  Demonstrates forward trunk flexion; short, shuffling steps; very slow and guarded, but no overt buckling or LOB.  Limited balance reactions evident.  Do recommend continued use of RW and +1 at all times   Would benefit from skilled PT to address above deficits and promote optimal return to PLOF.; Recommend transition to HHPT upon discharge from acute hospitalization.     Recommendations for follow up therapy are one component of a multi-disciplinary discharge planning process, led by the attending physician.  Recommendations may be updated based on patient status, additional functional criteria and insurance authorization.  Follow Up Recommendations Home health PT      Assistance Recommended at Discharge Intermittent Supervision/Assistance  Patient can return home with the following  A little help with bathing/dressing/bathroom;A lot of help with walking and/or transfers    Equipment Recommendations    Recommendations for Other Services       Functional Status Assessment Patient has had a recent decline in their functional status and demonstrates the ability to make  significant improvements in function in a reasonable and predictable amount of time.     Precautions / Restrictions Precautions Precautions: Fall Precaution Comments: chronic foley Restrictions Weight Bearing Restrictions: No      Mobility  Bed Mobility Overal bed mobility: Needs Assistance Bed Mobility: Supine to Sit     Supine to sit: Mod assist     General bed mobility comments: assist for truncal elevation    Transfers Overall transfer level: Needs assistance Equipment used: Rolling walker (2 wheels) Transfers: Bed to chair/wheelchair/BSC Sit to Stand: Mod assist Stand pivot transfers: Min assist, Mod assist         General transfer comment: cuing for foor placement, forward trunk lean/weight shift; extensive assist for lift off, anterior weight translation and initial stabilization    Ambulation/Gait Ambulation/Gait assistance: Min assist Gait Distance (Feet): 35 Feet Assistive device: Rolling walker (2 wheels)         General Gait Details: forward trunk flexion; short, shuffling steps; very slow and guarded, but no overt buckling or LOB.  Limited balance reactions evident.  Do recommend continued use of RW and +1 at all times  Stairs            Wheelchair Mobility    Modified Rankin (Stroke Patients Only)       Balance Overall balance assessment: Needs assistance Sitting-balance support: No upper extremity supported, Feet supported Sitting balance-Leahy Scale: Good     Standing balance support: Bilateral upper extremity supported Standing balance-Leahy Scale: Fair                               Pertinent Vitals/Pain Pain Assessment Pain Assessment:  No/denies pain    Home Living Family/patient expects to be discharged to:: Private residence Living Arrangements: Children Available Help at Discharge: Available PRN/intermittently;Family Type of Home: House Home Access: Stairs to enter Entrance Stairs-Rails: None (holds  doorframe when negotiating) Entrance Stairs-Number of Steps: 1   Home Layout: Two level;Able to live on main level with bedroom/bathroom Home Equipment: Air cabin crew (4 wheels);Wheelchair - manual      Prior Function Prior Level of Function : Needs assist             Mobility Comments: Pt reports he sleeps in a lift chair but is able to ambulate to/from bathroom without assistance, uses doorframe to negotiate single step into house. ADLs Comments: Pt reports toileting independently but has home health RN that comes 1x/wk to provide min A for bathing.     Hand Dominance   Dominant Hand: Right    Extremity/Trunk Assessment   Upper Extremity Assessment Upper Extremity Assessment: Overall WFL for tasks assessed    Lower Extremity Assessment Lower Extremity Assessment: Overall WFL for tasks assessed (grossly 4/5 throughout)       Communication   Communication: No difficulties  Cognition Arousal/Alertness: Awake/alert Behavior During Therapy: WFL for tasks assessed/performed Overall Cognitive Status: Within Functional Limits for tasks assessed                                          General Comments      Exercises     Assessment/Plan    PT Assessment Patient needs continued PT services  PT Problem List Decreased strength;Decreased activity tolerance;Decreased balance;Decreased mobility;Decreased knowledge of use of DME;Decreased safety awareness;Decreased knowledge of precautions       PT Treatment Interventions DME instruction;Gait training;Functional mobility training;Stair training;Therapeutic activities;Therapeutic exercise;Balance training;Patient/family education    PT Goals (Current goals can be found in the Care Plan section)  Acute Rehab PT Goals Patient Stated Goal: to get up and get moving so I don't become a bed sore PT Goal Formulation: With patient Time For Goal Achievement: 02/28/22 Potential to Achieve Goals: Good     Frequency Min 2X/week     Co-evaluation               AM-PAC PT "6 Clicks" Mobility  Outcome Measure Help needed turning from your back to your side while in a flat bed without using bedrails?: None Help needed moving from lying on your back to sitting on the side of a flat bed without using bedrails?: A Lot Help needed moving to and from a bed to a chair (including a wheelchair)?: A Lot Help needed standing up from a chair using your arms (e.g., wheelchair or bedside chair)?: A Lot Help needed to walk in hospital room?: A Little Help needed climbing 3-5 steps with a railing? : A Lot 6 Click Score: 15    End of Session Equipment Utilized During Treatment: Gait belt Activity Tolerance: Patient tolerated treatment well Patient left: in chair;with call bell/phone within reach;with chair alarm set Nurse Communication: Mobility status PT Visit Diagnosis: Muscle weakness (generalized) (M62.81);Difficulty in walking, not elsewhere classified (R26.2)    Time: 8299-3716 PT Time Calculation (min) (ACUTE ONLY): 27 min   Charges:   PT Evaluation $PT Eval Moderate Complexity: 1 Mod          Kelle Ruppert H. Owens Shark, PT, DPT, NCS 02/14/22, 10:08 AM [phone]

## 2022-02-14 NOTE — Discharge Summary (Addendum)
Physician Discharge Summary   Wilborn Membreno.  male DOB: [birth year]  PJA:[id number]  PCP: Dola Argyle, MD  Admit date: 02/11/2022 Discharge date: 02/14/2022  Admitted From: home Disposition:  home CODE STATUS: Full code  Discharge Instructions     Discharge instructions   Complete by: As directed    You have multi-drug resistant E coli causing you UTI.  You have received 3 days of IV antibiotic.  Please finish 3 more days of oral antibiotic Bactrim after discharge.    Your blood pressure has been low in the hospital.  Please hold lasix, aldactone and losartan until you follow up with PCP or outpatient provider.  Please follow up with your outpatient urologist.   Dr. Enzo Bi - -   No wound care   Complete by: As directed       Hospital Course:  For full details, please see H&P, progress notes, consult notes and ancillary notes.  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   Of note he was most recently seen in the ED on 01/01/22 due to complaints of suprapubic pain, low back pain, and concern that his chronic foley was blocked.  He was found to have a UTI, was given IV Rocephin and foley was exchanged.  He was discharged home from ED with plan to complete course of Cefdinir.  Of note he has also had positive urine cultures back in April 2023 growing pseudomonas,  and blood cultures with ESBL E. Coli in June 2023.   On presentation, pt was noted to be hypotensive therefore he was given IV fluid resuscitation and broad-spectrum IV antibiotics.  Despite fluid resuscitation he remained hypotensive so peripheral Levophed was started and was admitted by PCCM.    Hypotension --unclear why pt was hypotensive.  Septic shock ruled out because pt did not meet sepsis criteria.   --Pt weaned off Levophed on 9/14 and was started on midodrine.  BP improved prior to discharge, so midodrine was d/c'ed.  UTI 2/2 Chronic Foley catheter due to Neurogenic bladder  Hx of ESBL e. coli back in June 2023) -Chronic foley exchanged in ED 9/13 (ED provider discussed with Urology who recommended catheter exchange, pt has requested outpatient management with chronic foley, has declined suprapubic catheter placement) --started on meropenem --urine cx pos for ESBL E coli and MRSA --ID consulted, and rec discharge on 3 more days of Bactrim after 3 days of IV meropenem.  AKI, ruled out CKD2 --Cr 1.21 on presentation.  Baseline around 1.  Did not meet criteria for AKI.   Acute Metabolic Encephalopathy in setting of sepsis and UTI, RESOLVED Hx of CVA, Vascular dementia -ABG normal, UDS is negative -Continue home Aricept   Mildly elevated Troponin, suspect demand ischemia -HS Troponin peaked at 73    Chronic diastolic CHF without acute exacerbation -Hold home Lasix, and Spironolactone for now due to hypotension.   Left Renal cell carcinoma status post nephrectomy in 1984   HTN -Hold home Lasix, Losartan, and Spironolactone for now due to hypotension.   PVD --cont ASA and statin  Sepsis, ruled out --did not meet criteria for sepsis  Pressure Injury 02/11/22 Buttocks Left Stage 2 , POA    Discharge Diagnoses:  Principal Problem:   Septic shock ([place]) Active Problems:   Pressure injury of skin   30 Day Unplanned Readmission Risk Score    Flowsheet Row ED to Hosp-Admission (Current) from 02/11/2022 in [place]  30 Day Unplanned Readmission Risk Score (%) 29.64 Filed at 02/14/2022 0800       This score is the patient's risk of an unplanned readmission within 30 days of being discharged (0 -100%). The score is based  on dignosis, age, lab data, medications, orders, and past utilization.   Low:  0-14.9   Medium: 15-21.9   High: 22-29.9   Extreme: 30 and above         Discharge Instructions:  Allergies as of 02/14/2022       Reactions   Fentanyl Nausea Only   Oxycodone Nausea And Vomiting        Medication List     STOP taking these medications    cefdinir 300 MG capsule Commonly known as: OMNICEF   ondansetron 4 MG disintegrating tablet Commonly known as: ZOFRAN-ODT       TAKE these medications    acetaminophen 325 MG tablet Commonly known as: TYLENOL Take 500 mg by mouth every 6 (six) hours as needed.   aspirin EC 81 MG tablet Take 81 mg by mouth daily.   atorvastatin 40 MG tablet Commonly known as: LIPITOR Take 40 mg by mouth daily.   cyanocobalamin 1000 MCG tablet Commonly known as: VITAMIN B12 Take 1,000 mcg by mouth daily.   diclofenac Sodium 1 % Gel Commonly known as: VOLTAREN Apply 2 g topically 2 (two) times daily.   donepezil 10 MG tablet Commonly known as: ARICEPT Take 10 mg by mouth at bedtime.   furosemide 40 MG tablet Commonly known as: LASIX Hold until followup with your outpatient provider because your blood pressure was low in the hospital. What changed:  how much to take how to take this when to take this additional instructions   gabapentin 300 MG capsule Commonly known as: NEURONTIN Take 1 capsule (300 mg total) by mouth 2 (two) times daily.   lidocaine 5 % Commonly known as: LIDODERM Place 2 patches onto the skin daily.   losartan 25 MG tablet Commonly known as: COZAAR Hold until followup with your outpatient provider because your blood pressure was low in the hospital. What changed:  how much to take how to take this when to take this additional instructions   mirabegron ER 25 MG Tb24 tablet Commonly known as: MYRBETRIQ Take 25 mg by mouth daily.   omeprazole 20 MG capsule Commonly known as: PRILOSEC Take 1 capsule (20 mg  total) by mouth daily.   senna-docusate 8.6-50 MG tablet Commonly known as: Senokot-S Take 1 tablet by mouth 2 (two) times daily.   spironolactone 25 MG tablet Commonly known as: ALDACTONE Hold until followup with your outpatient provider because your blood pressure was low in the hospital. What changed:  how much to take how to take this when to take this additional instructions   sulfamethoxazole-trimethoprim 800-160 MG tablet Commonly known as: BACTRIM DS Take 1 tablet by mouth every 12 (twelve) hours for 3 days.   tamsulosin 0.4 MG Caps capsule Commonly known as: FLOMAX Take 0.4 mg by mouth daily.   traZODone 50 MG tablet Commonly known as: DESYREL Take 50-100 mg by mouth at bedtime.         Follow-up Information     Dola Argyle, MD Follow up in 1 week(s).  Specialty: Family Medicine                Allergies  Allergen Reactions   Fentanyl Nausea Only   Oxycodone Nausea And Vomiting     The results of significant diagnostics from this hospitalization (including imaging, microbiology, ancillary and laboratory) are listed below for reference.   Consultations:   Procedures/Studies: US RENAL  Result Date: 02/13/2022 CLINICAL DATA:  6300 hydronephrosis. EXAM: RENAL / URINARY TRACT ULTRASOUND COMPLETE COMPARISON:  November 21, 2021 FINDINGS: Right Kidney: Renal measurements: 12.1 x 7.0 x 6.7 cm = volume: 294 mL. Echogenicity within normal limits. No mass or hydronephrosis visualized. Trace perirenal fluid is noted. Left Kidney: Surgically absent. Bladder: Decompressed around a Foley catheter. Other: None. IMPRESSION: No hydronephrosis of the RIGHT kidney. Trace perirenal fluid is noted. This is nonspecific. Electronically Signed   By: Valentino Saxon M.D.   On: 02/13/2022 16:08   DG Chest Port 1 View  Result Date: 02/11/2022 CLINICAL DATA:  81 year old male with possible sepsis.  Pain. EXAM: PORTABLE CHEST 1 VIEW COMPARISON:  Portable chest 01/01/2022  and earlier. FINDINGS: Portable AP semi upright view at 0907 hours. Very low lung volumes. Chronic elevation of the right hemidiaphragm. Calcified aortic atherosclerosis. Postinflammatory calcified mediastinal lymph nodes. Other mediastinal contours are within normal limits. No pneumothorax, pulmonary edema, pleural effusion or consolidation. Visualized tracheal air column is within normal limits. No acute osseous abnormality identified. Paucity of bowel gas. IMPRESSION: 1. Very low lung volumes. No acute cardiopulmonary abnormality. 2. Aortic Atherosclerosis (ICD10-I70.0). Calcified postinflammatory mediastinal lymph nodes. Electronically Signed   By: Genevie Ann M.D.   On: 02/11/2022 09:32      Labs: BNP (last 3 results) Recent Labs    05/14/21 1710 09/08/21 0110 01/01/22 1720  BNP 56.3 85.9 87.5   Basic Metabolic Panel: Recent Labs  Lab 02/11/22 0842 02/12/22 0647 02/13/22 0702 02/14/22 0551  NA 136 136 137 137  K 3.8 3.8 3.7 3.6  CL 106 107 108 108  CO2 '22 23 23 22  '$ GLUCOSE 147* 102* 88 92  BUN '22 14 13 17  '$ CREATININE 1.21 1.04 0.99 0.92  CALCIUM 9.1 8.2* 8.4* 8.7*  MG  --   --  1.9 2.0  PHOS  --   --  2.8  --    Liver Function Tests: Recent Labs  Lab 02/11/22 0842  AST 20  ALT 11  ALKPHOS 74  BILITOT 0.8  PROT 7.4  ALBUMIN 3.8   No results for input(s): "LIPASE", "AMYLASE" in the last 168 hours. No results for input(s): "AMMONIA" in the last 168 hours. CBC: Recent Labs  Lab 02/11/22 0842 02/12/22 0647 02/13/22 0702 02/14/22 0551  WBC 11.2* 7.3 7.8 7.0  NEUTROABS 9.0*  --   --   --   HGB 12.7* 10.5* 11.1* 11.3*  HCT 40.5 33.7* 35.6* 36.1*  MCV 85.3 85.3 85.0 84.9  PLT 230 194 186 183   Cardiac Enzymes: No results for input(s): "CKTOTAL", "CKMB", "CKMBINDEX", "TROPONINI" in the last 168 hours. BNP: Invalid input(s): "POCBNP" CBG: Recent Labs  Lab 02/11/22 1248 02/11/22 1605  GLUCAP 102* 114*   D-Dimer No results for input(s): "DDIMER" in the last  72 hours. Hgb A1c No results for input(s): "HGBA1C" in the last 72 hours. Lipid Profile No results for input(s): "CHOL", "HDL", "LDLCALC", "TRIG", "CHOLHDL", "LDLDIRECT" in the last 72 hours. Thyroid function studies No results for input(s): "TSH", "T4TOTAL", "T3FREE", "THYROIDAB" in the last 72 hours.  Invalid input(s): "FREET3" Anemia work  up No results for input(s): "VITAMINB12", "FOLATE", "FERRITIN", "TIBC", "IRON", "RETICCTPCT" in the last 72 hours. Urinalysis    Component Value Date/Time   COLORURINE YELLOW (A) 02/11/2022 1034   APPEARANCEUR HAZY (A) 02/11/2022 1034   LABSPEC 1.017 02/11/2022 1034   PHURINE 5.0 02/11/2022 1034   GLUCOSEU NEGATIVE 02/11/2022 1034   HGBUR SMALL (A) 02/11/2022 1034   BILIRUBINUR NEGATIVE 02/11/2022 1034   KETONESUR NEGATIVE 02/11/2022 1034   PROTEINUR 30 (A) 02/11/2022 1034   NITRITE NEGATIVE 02/11/2022 1034   LEUKOCYTESUR MODERATE (A) 02/11/2022 1034   Sepsis Labs Recent Labs  Lab 02/11/22 0842 02/12/22 0647 02/13/22 0702 02/14/22 0551  WBC 11.2* 7.3 7.8 7.0   Microbiology Recent Results (from the past 240 hour(s))  Resp Panel by RT-PCR (Flu A&B, Covid) Anterior Nasal Swab     Status: None   Collection Time: 02/11/22  9:06 AM   Specimen: Anterior Nasal Swab  Result Value Ref Range Status   SARS Coronavirus 2 by RT PCR NEGATIVE NEGATIVE Final    Comment: (NOTE) SARS-CoV-2 target nucleic acids are NOT DETECTED.  The SARS-CoV-2 RNA is generally detectable in upper respiratory specimens during the acute phase of infection. The lowest concentration of SARS-CoV-2 viral copies this assay can detect is 138 copies/mL. A negative result does not preclude SARS-Cov-2 infection and should not be used as the sole basis for treatment or other patient management decisions. A negative result may occur with  improper specimen collection/handling, submission of specimen other than nasopharyngeal swab, presence of viral mutation(s) within  the areas targeted by this assay, and inadequate number of viral copies(<138 copies/mL). A negative result must be combined with clinical observations, patient history, and epidemiological information. The expected result is Negative.  Fact Sheet for Patients:  EntrepreneurPulse.com.au  Fact Sheet for Healthcare Providers:  IncredibleEmployment.be  This test is no t yet approved or cleared by the [place] FDA and  has been authorized for detection and/or diagnosis of SARS-CoV-2 by FDA under an Emergency Use Authorization (EUA). This EUA will remain  in effect (meaning this test can be used) for the duration of the COVID-19 declaration under Section 564(b)(1) of the Act, 21 U.S.C.section 360bbb-3(b)(1), unless the authorization is terminated  or revoked sooner.       Influenza A by PCR NEGATIVE NEGATIVE Final   Influenza B by PCR NEGATIVE NEGATIVE Final    Comment: (NOTE) The Xpert Xpress SARS-CoV-2/FLU/RSV plus assay is intended as an aid in the diagnosis of influenza from Nasopharyngeal swab specimens and should not be used as a sole basis for treatment. Nasal washings and aspirates are unacceptable for Xpert Xpress SARS-CoV-2/FLU/RSV testing.  Fact Sheet for Patients: EntrepreneurPulse.com.au  Fact Sheet for Healthcare Providers: IncredibleEmployment.be  This test is not yet approved or cleared by the [place] FDA and has been authorized for detection and/or diagnosis of SARS-CoV-2 by FDA under an Emergency Use Authorization (EUA). This EUA will remain in effect (meaning this test can be used) for the duration of the COVID-19 declaration under Section 564(b)(1) of the Act, 21 U.S.C. section 360bbb-3(b)(1), unless the authorization is terminated or revoked.  Performed at [facility], [address] [place], [place] [address]   Blood Culture (routine x 2)     Status: None  (Preliminary result)   Collection Time: 02/11/22  9:06 AM   Specimen: BLOOD RIGHT ARM  Result Value Ref Range Status   Specimen Description BLOOD RIGHT ARM  Final   Special Requests   Final  BOTTLES DRAWN AEROBIC AND ANAEROBIC Blood Culture results may not be optimal due to an excessive volume of blood received in culture bottles   Culture   Final    NO GROWTH 3 DAYS Performed at [facility], [address] [place], [place] [address]    Report Status PENDING  Incomplete  Blood Culture (routine x 2)     Status: None (Preliminary result)   Collection Time: 02/11/22  9:06 AM   Specimen: BLOOD LEFT ARM  Result Value Ref Range Status   Specimen Description BLOOD LEFT ARM  Final   Special Requests   Final    BOTTLES DRAWN AEROBIC AND ANAEROBIC Blood Culture adequate volume   Culture   Final    NO GROWTH 3 DAYS Performed at [facility], [address] [place], [place] [address]    Report Status PENDING  Incomplete  Urine Culture     Status: Abnormal   Collection Time: 02/11/22 10:34 AM   Specimen: In/Out Cath Urine  Result Value Ref Range Status   Specimen Description   Final    IN/OUT CATH URINE Performed at [facility], [address], [place], [place] [address]    Special Requests   Final    NONE Performed at [facility], [address] [place],  [address]    Culture (A)  Final    >=100,000 COLONIES/mL ESCHERICHIA COLI 30,000 COLONIES/mL METHICILLIN RESISTANT STAPHYLOCOCCUS AUREUS Confirmed Extended Spectrum Beta-Lactamase Producer (ESBL).  In bloodstream infections from ESBL organisms, carbapenems are preferred over piperacillin/tazobactam. They are shown to have a lower risk of mortality.    Report Status 02/13/2022 FINAL  Final   Organism ID, Bacteria ESCHERICHIA COLI (A)  Final   Organism ID, Bacteria METHICILLIN RESISTANT STAPHYLOCOCCUS AUREUS (A)  Final      Susceptibility   Escherichia coli - MIC*    AMPICILLIN  >=32 RESISTANT Resistant     CEFAZOLIN >=64 RESISTANT Resistant     CEFEPIME 16 RESISTANT Resistant     CEFTRIAXONE >=64 RESISTANT Resistant     CIPROFLOXACIN >=4 RESISTANT Resistant     GENTAMICIN >=16 RESISTANT Resistant     IMIPENEM <=0.25 SENSITIVE Sensitive     NITROFURANTOIN <=16 SENSITIVE Sensitive     TRIMETH/SULFA <=20 SENSITIVE Sensitive     AMPICILLIN/SULBACTAM >=32 RESISTANT Resistant     PIP/TAZO 8 SENSITIVE Sensitive     * >=100,000 COLONIES/mL ESCHERICHIA COLI   Methicillin resistant staphylococcus aureus - MIC*    CIPROFLOXACIN >=8 RESISTANT Resistant     GENTAMICIN <=0.5 SENSITIVE Sensitive     NITROFURANTOIN <=16 SENSITIVE Sensitive     OXACILLIN >=4 RESISTANT Resistant     TETRACYCLINE <=1 SENSITIVE Sensitive     VANCOMYCIN 1 SENSITIVE Sensitive     TRIMETH/SULFA <=10 SENSITIVE Sensitive     CLINDAMYCIN <=0.25 SENSITIVE Sensitive     RIFAMPIN <=0.5 SENSITIVE Sensitive     Inducible Clindamycin NEGATIVE Sensitive     * 30,000 COLONIES/mL METHICILLIN RESISTANT STAPHYLOCOCCUS AUREUS  MRSA Next Gen by PCR, Nasal     Status: Abnormal   Collection Time: 02/11/22  1:04 PM   Specimen: Nasal Mucosa; Nasal Swab  Result Value Ref Range Status   MRSA by PCR Next Gen DETECTED (A) NOT DETECTED Final    Comment: RESULT CALLED TO, READ BACK BY AND VERIFIED WITH: UTE JACOBS 02/11/22 1459 MU (NOTE) The GeneXpert MRSA Assay (FDA approved for NASAL specimens only), is one component of a comprehensive MRSA colonization surveillance program. It  is not intended to diagnose MRSA infection nor to guide or monitor treatment for MRSA infections. Test performance is not FDA approved in patients less than 17 years old. Performed at [facility], [address] [place], [place] [address]      Total time spend on discharging this patient, including the last patient exam, discussing the hospital stay, instructions for ongoing care as it relates to all pertinent caregivers, as  well as preparing the medical discharge records, prescriptions, and/or referrals as applicable, is 35 minutes.    Enzo Bi, MD  [facility] 02/14/2022, 11:24 AM

## 2022-02-16 LAB — CULTURE, BLOOD (ROUTINE X 2)
Culture: NO GROWTH
Culture: NO GROWTH
Special Requests: ADEQUATE

## 2022-02-26 ENCOUNTER — Other Ambulatory Visit: Payer: Self-pay

## 2022-02-26 ENCOUNTER — Emergency Department
Admission: EM | Admit: 2022-02-26 | Discharge: 2022-02-26 | Disposition: A | Discharge status: Home / Self Care | Payer: Medicare Other | Attending: Emergency Medicine | Admitting: Emergency Medicine

## 2022-02-26 DIAGNOSIS — R82998 Other abnormal findings in urine: Secondary | ICD-10-CM | POA: Diagnosis not present

## 2022-02-26 DIAGNOSIS — T83031A Leakage of indwelling urethral catheter, initial encounter: Secondary | ICD-10-CM | POA: Insufficient documentation

## 2022-02-26 NOTE — ED Provider Notes (Signed)
[facility] Provider Note  Patient Contact: 9:38 PM (approximate)   History   Catheter Problem    HPI  Adam Urton. is a 81 y.o. male who presents the emergency department just needing a urinary Foley catheter bag replaced.  Patient states that the home health nurse accidentally stepped on the back creating a leak.  Patient has no other complaints, states that he only needs his back replaced.     Physical Exam   Triage Vital Signs: ED Triage Vitals [02/26/22 2116]  Enc Vitals Group     BP (!) 139/94     Pulse Rate 85     Resp 16     Temp 97.8 F (36.6 C)     Temp Source Oral     SpO2 98 %     Weight 220 lb (99.8 kg)     Height '6\' 1"'$  (1.854 m)     Head Circumference      Peak Flow      Pain Score 0     Pain Loc      Pain Edu?      Excl. in [place]?     Most recent vital signs: Vitals:   02/26/22 2116  BP: (!) 139/94  Pulse: 85  Resp: 16  Temp: 97.8 F (36.6 C)  SpO2: 98%     General: Alert and in no acute distress.Absolutely  Cardiovascular:  Good peripheral perfusion Respiratory: Normal respiratory effort without tachypnea or retractions. Lungs CTAB.  Gastrointestinal: Bowel sounds 4 quadrants. Soft and nontender to palpation. No guarding or rigidity. No palpable masses. No distention. No CVA tenderness. Musculoskeletal: Full range of motion to all extremities.  Neurologic:  No gross focal neurologic deficits are appreciated.  Skin:   No rash noted Other:   ED Results / Procedures / Treatments   Labs (all labs ordered are listed, but only abnormal results are displayed) Labs Reviewed - No data to display   EKG     RADIOLOGY    No results found.  PROCEDURES:  Critical Care performed: No  Procedures   MEDICATIONS ORDERED IN ED: Medications - No data to display   IMPRESSION / MDM / [place] / ED COURSE  I reviewed the triage vital signs and the nursing notes.                               Differential diagnosis includes, but is not limited to, UTI, Foley complication, urosepsis  Patient's presentation is most consistent with acute presentation with potential threat to life or bodily function.   Patient's diagnosis is consistent with urinary catheter issue.  Patient presented to the emergency department needing his urinary catheter bag replaced.  Patient states that the home health nurse accidentally stepped on it causing a week.  Patient has no complaints.  Was recently admitted for sepsis.  Patient has no complaints at this time other than needing his urinary catheter bag replaced.  He finished out his antibiotics.  This time bag is replaced, patient is stable for discharge..  Patient is given ED precautions to return to the ED for any worsening or new symptoms.        FINAL CLINICAL IMPRESSION(S) / ED DIAGNOSES   Final diagnoses:  Leakage of indwelling urethral catheter, initial encounter ([place])     Rx / DC Orders   ED Discharge Orders     None  Note:  This document was prepared using Dragon voice recognition software and may include unintentional dictation errors.   Brynda Peon 02/26/22 2141    Carrie Mew, MD 02/27/22 Darlin Drop

## 2022-02-26 NOTE — ED Notes (Signed)
Foley bag changed. Pt sitting in wheelchair no needs at this time.

## 2022-02-26 NOTE — ED Triage Notes (Signed)
Pt arrives with c/o leaking catheter bag. Pt request his catheter bag to changed due to having a leak in his.

## 2022-04-29 ENCOUNTER — Emergency Department: Payer: Medicare Other

## 2022-04-29 ENCOUNTER — Inpatient Hospital Stay
Admission: EM | Admit: 2022-04-29 | Discharge: 2022-05-03 | DRG: 260 | Disposition: A | Discharge status: Home Health | Payer: Medicare Other | Attending: Internal Medicine | Admitting: Internal Medicine

## 2022-04-29 ENCOUNTER — Other Ambulatory Visit: Payer: Self-pay

## 2022-04-29 DIAGNOSIS — K592 Neurogenic bowel, not elsewhere classified: Secondary | ICD-10-CM | POA: Diagnosis present

## 2022-04-29 DIAGNOSIS — K5900 Constipation, unspecified: Secondary | ICD-10-CM | POA: Diagnosis present

## 2022-04-29 DIAGNOSIS — R531 Weakness: Secondary | ICD-10-CM | POA: Diagnosis present

## 2022-04-29 DIAGNOSIS — E785 Hyperlipidemia, unspecified: Secondary | ICD-10-CM | POA: Diagnosis present

## 2022-04-29 DIAGNOSIS — Z8673 Personal history of transient ischemic attack (TIA), and cerebral infarction without residual deficits: Secondary | ICD-10-CM

## 2022-04-29 DIAGNOSIS — Z6830 Body mass index (BMI) 30.0-30.9, adult: Secondary | ICD-10-CM

## 2022-04-29 DIAGNOSIS — I110 Hypertensive heart disease with heart failure: Principal | ICD-10-CM | POA: Diagnosis present

## 2022-04-29 DIAGNOSIS — Z87891 Personal history of nicotine dependence: Secondary | ICD-10-CM

## 2022-04-29 DIAGNOSIS — Z85528 Personal history of other malignant neoplasm of kidney: Secondary | ICD-10-CM

## 2022-04-29 DIAGNOSIS — I2510 Atherosclerotic heart disease of native coronary artery without angina pectoris: Secondary | ICD-10-CM | POA: Diagnosis present

## 2022-04-29 DIAGNOSIS — Z7982 Long term (current) use of aspirin: Secondary | ICD-10-CM

## 2022-04-29 DIAGNOSIS — I4891 Unspecified atrial fibrillation: Secondary | ICD-10-CM | POA: Diagnosis present

## 2022-04-29 DIAGNOSIS — Z905 Acquired absence of kidney: Secondary | ICD-10-CM

## 2022-04-29 DIAGNOSIS — R601 Generalized edema: Secondary | ICD-10-CM

## 2022-04-29 DIAGNOSIS — Z79899 Other long term (current) drug therapy: Secondary | ICD-10-CM

## 2022-04-29 DIAGNOSIS — M549 Dorsalgia, unspecified: Secondary | ICD-10-CM | POA: Diagnosis present

## 2022-04-29 DIAGNOSIS — I509 Heart failure, unspecified: Principal | ICD-10-CM

## 2022-04-29 DIAGNOSIS — G8929 Other chronic pain: Secondary | ICD-10-CM | POA: Diagnosis present

## 2022-04-29 DIAGNOSIS — R600 Localized edema: Secondary | ICD-10-CM

## 2022-04-29 DIAGNOSIS — Z885 Allergy status to narcotic agent status: Secondary | ICD-10-CM

## 2022-04-29 DIAGNOSIS — N319 Neuromuscular dysfunction of bladder, unspecified: Secondary | ICD-10-CM

## 2022-04-29 DIAGNOSIS — R609 Edema, unspecified: Secondary | ICD-10-CM

## 2022-04-29 DIAGNOSIS — E669 Obesity, unspecified: Secondary | ICD-10-CM | POA: Insufficient documentation

## 2022-04-29 DIAGNOSIS — I441 Atrioventricular block, second degree: Secondary | ICD-10-CM

## 2022-04-29 DIAGNOSIS — I5032 Chronic diastolic (congestive) heart failure: Secondary | ICD-10-CM

## 2022-04-29 DIAGNOSIS — N401 Enlarged prostate with lower urinary tract symptoms: Secondary | ICD-10-CM | POA: Diagnosis present

## 2022-04-29 DIAGNOSIS — I10 Essential (primary) hypertension: Secondary | ICD-10-CM | POA: Diagnosis present

## 2022-04-29 DIAGNOSIS — Z978 Presence of other specified devices: Secondary | ICD-10-CM

## 2022-04-29 DIAGNOSIS — Z981 Arthrodesis status: Secondary | ICD-10-CM

## 2022-04-29 DIAGNOSIS — I5033 Acute on chronic diastolic (congestive) heart failure: Secondary | ICD-10-CM | POA: Diagnosis present

## 2022-04-29 DIAGNOSIS — R0600 Dyspnea, unspecified: Secondary | ICD-10-CM

## 2022-04-29 DIAGNOSIS — I442 Atrioventricular block, complete: Secondary | ICD-10-CM

## 2022-04-29 DIAGNOSIS — N138 Other obstructive and reflux uropathy: Secondary | ICD-10-CM | POA: Diagnosis present

## 2022-04-29 DIAGNOSIS — R4189 Other symptoms and signs involving cognitive functions and awareness: Secondary | ICD-10-CM | POA: Diagnosis present

## 2022-04-29 DIAGNOSIS — F0390 Unspecified dementia without behavioral disturbance: Secondary | ICD-10-CM | POA: Diagnosis present

## 2022-04-29 LAB — D-DIMER, QUANTITATIVE: D-Dimer, Quant: 0.78 ug/mL-FEU — ABNORMAL HIGH (ref 0.00–0.50)

## 2022-04-29 LAB — BRAIN NATRIURETIC PEPTIDE: B Natriuretic Peptide: 119.6 pg/mL — ABNORMAL HIGH (ref 0.0–100.0)

## 2022-04-29 LAB — BASIC METABOLIC PANEL
Anion gap: 9 (ref 5–15)
BUN: 21 mg/dL (ref 8–23)
CO2: 25 mmol/L (ref 22–32)
Calcium: 9.1 mg/dL (ref 8.9–10.3)
Chloride: 102 mmol/L (ref 98–111)
Creatinine, Ser: 1.11 mg/dL (ref 0.61–1.24)
GFR, Estimated: >60 mL/min (ref >60)
Glucose, Bld: 96 mg/dL (ref 70–99)
Potassium: 3.9 mmol/L (ref 3.5–5.1)
Sodium: 136 mmol/L (ref 135–145)

## 2022-04-29 LAB — CBC
HCT: 41.1 % (ref 39.0–52.0)
Hemoglobin: 12.5 g/dL — ABNORMAL LOW (ref 13.0–17.0)
MCH: 26.3 pg (ref 26.0–34.0)
MCHC: 30.4 g/dL (ref 30.0–36.0)
MCV: 86.3 fL (ref 80.0–100.0)
Platelets: 216 10*3/uL (ref 150–400)
RBC: 4.76 MIL/uL (ref 4.22–5.81)
RDW: 15.1 % (ref 11.5–15.5)
WBC: 8.6 10*3/uL (ref 4.0–10.5)
nRBC: 0 % (ref 0.0–0.2)

## 2022-04-29 LAB — TROPONIN I (HIGH SENSITIVITY)
Troponin I (High Sensitivity): 30 ng/L — ABNORMAL HIGH (ref <18)
Troponin I (High Sensitivity): 35 ng/L — ABNORMAL HIGH (ref <18)

## 2022-04-29 LAB — HEPATIC FUNCTION PANEL
ALT: 20 U/L (ref 0–44)
AST: 27 U/L (ref 15–41)
Albumin: 4.2 g/dL (ref 3.5–5.0)
Alkaline Phosphatase: 79 U/L (ref 38–126)
Bilirubin, Direct: 0.1 mg/dL (ref 0.0–0.2)
Indirect Bilirubin: 1.1 mg/dL — ABNORMAL HIGH (ref 0.3–0.9)
Total Bilirubin: 1.2 mg/dL (ref 0.3–1.2)
Total Protein: 8.3 g/dL — ABNORMAL HIGH (ref 6.5–8.1)

## 2022-04-29 MED ORDER — IOHEXOL 350 MG/ML SOLN: 75.0000 mL | Freq: Once | INTRAVENOUS | Status: DC | PRN

## 2022-04-29 MED ORDER — FUROSEMIDE 10 MG/ML IJ SOLN
40.0000 mg | Freq: Once | INTRAMUSCULAR | Status: AC
  Administered 2022-04-29: 40 mg via INTRAVENOUS
  Filled 2022-04-29: qty 4

## 2022-04-29 NOTE — ED Triage Notes (Signed)
Pt comes via EMS from home with c/o increased weight gain and sob. Pt stats he takes lasix. Pt stats he might have UTI as well.

## 2022-04-29 NOTE — ED Provider Notes (Signed)
[facility] Provider Note    Event Date/Time   First MD Initiated Contact with Patient 04/29/22 2218     (approximate)   History   Shortness of Breath   HPI  Adam Rios. is a 81 y.o. 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  He reports leg swelling started about 6 months ago.  It usually not this bad because he usually keeps his legs up almost all the time.  However he has been sitting in the waiting room for about 5 hours he says in his legs up and down the whole time and now they are massively swollen.  Patient reports he is having trouble walking because he is weak and possibly because his legs are swollen.  He says his oxygen level and his pulse ox is usually in the high 90s as it is now.  He is not coughing.  He is not obviously short of breath.     Physical Exam   Triage Vital Signs: ED Triage Vitals  Enc Vitals Group     BP 04/29/22 1632 124/66     Pulse Rate 04/29/22 1632 75     Resp 04/29/22 1632 19     Temp 04/29/22 1632 98.4 F (36.9 C)     Temp Source 04/29/22 2100 Oral     SpO2 04/29/22 1632 100 %     Weight --      Height --      Head Circumference --      Peak Flow --      Pain Score 04/29/22 1631 6     Pain Loc --      Pain Edu? --      Excl. in [place]? --     Most recent vital signs: Vitals:   04/29/22 2243 04/29/22 2245  BP:    Pulse: (!) 116 (!) 116  Resp: (!) 21 15  Temp:    SpO2: 94% 94%    General: Awake, no distress.  CV:  Good peripheral perfusion.  Heart regular rate and rhythm no audible murmurs Resp:  Normal effort.  Occasional scattered crackles mostly clear Abd:  No distention.  Soft and nontender Extremities: Marked edema right worse than left   ED Results / Procedures / Treatments   Labs (all labs ordered are listed, but only abnormal results are displayed) Labs Reviewed   CBC - Abnormal; Notable for the following components:      Result Value   Hemoglobin 12.5 (*)    All other components within normal limits  BRAIN NATRIURETIC PEPTIDE - Abnormal; Notable for the following components:   B Natriuretic Peptide 119.6 (*)    All other components within normal limits  HEPATIC FUNCTION PANEL - Abnormal; Notable for the following components:   Total Protein 8.3 (*)    Indirect Bilirubin 1.1 (*)    All other components within normal limits  D-DIMER, QUANTITATIVE - Abnormal; Notable for the following components:   D-Dimer, Quant 0.78 (*)    All other components within normal limits  TROPONIN I (HIGH SENSITIVITY) - Abnormal; Notable for the following components:   Troponin I (High Sensitivity) 30 (*)    All other components within normal limits  TROPONIN I (HIGH SENSITIVITY) - Abnormal; Notable for the following components:   Troponin I (High Sensitivity) 35 (*)    All other components  within normal limits  BASIC METABOLIC PANEL     EKG  EKG read interpreted by me shows normal sinus rhythm rate of 78 left axis right bundle branch block decreased R wave progression nonspecific ST-T wave changes and Q waves inferiorly.   RADIOLOGY Chest x-ray read by radiology reviewed and interpreted by me does not show any acute disease.  PROCEDURES:  Critical Care performed:   Procedures   MEDICATIONS ORDERED IN ED: Medications  furosemide (LASIX) injection 40 mg (40 mg Intravenous Given 04/29/22 2235)     IMPRESSION / MDM / ASSESSMENT AND PLAN / ED COURSE  I reviewed the triage vital signs and the nursing notes. Patient is tachycardic up to a rate of 120 at times.  He says he is short of breath O2 sats are now fluctuating between 96 and 91.  He does have a massive edema.  I will send a D-dimer since he is tachycardic and short of breath and has a clear chest x-ray. Patient had a normal ejection fraction when he had an echo cardiogram on June of this  year. Differential diagnosis includes, but is not limited to, congestive failure, low protein, dependent edema due to LOC lack of exercise.  Patient's presentation is most consistent with acute presentation with potential threat to life or bodily function.  The patient is on the cardiac monitor to evaluate for evidence of arrhythmia and/or significant heart rate changes.  None have been seen so far ----------------------------------------- 11:09 PM on 04/29/2022 ----------------------------------------- I am signing patient out to oncoming provider.  We will have to check on the 2 ultrasounds and some of the rest of the lab work.  FINAL CLINICAL IMPRESSION(S) / ED DIAGNOSES   Final diagnoses:  Peripheral edema  Dyspnea, unspecified type     Rx / DC Orders   ED Discharge Orders     None        Note:  This document was prepared using Dragon voice recognition software and may include unintentional dictation errors.   Nena Polio, MD 04/29/22 [phone]

## 2022-04-29 NOTE — ED Provider Notes (Signed)
----------------------------------------- 11:21 PM on 04/29/2022 -----------------------------------------  Assuming care from Dr. Cinda Quest.  In short, Adam Rios. is a 81 y.o. male with a chief complaint of shortness of breath and swelling.  Refer to the original H&P for additional details.  The current plan of care is to follow-up on imaging and reassess the patient..   Clinical Course as of 04/30/22 0728  Thu Apr 30, 2022  0212 I viewed and interpreted the patient's CTA chest and his CT abdomen pelvis as well as his bilateral lower extremity venous ultrasound and his ultrasound of the abdomen.  There are no acute abnormalities identified on any of the images suggestive of PE, DVT, pneumonia, etc.  The radiologist report suggest the possibility of reactive airway disease on the CTA chest. [CF]  0213 I reassessed the patient.  He said he feels a little bit more comfortable and his heart rate has come down to the 80s to 90s.  However he said he still feels very tight in the abdomen and his legs are still extremely swollen.  Whenever he starts to move around he becomes more short of breath and he does not feel he can safely go home.  I tend to agree with him that he is still quite volume overloaded.  Of note, I sent a urine specimen from his chronic indwelling Foley catheter to culture.  I will not check a urinalysis as the bag was just changed 2 weeks ago and the urine in the bag looks clear yellow.  However if there is pathogenic bacteria on the urine culture, it will need to be treated.  I will defer to the admitting team.  I am consulting the hospitalist service for admission. [CF]  0214 Patient is also reporting headache and I ordered acetaminophen 1000 mg p.o.  He is also reporting some generalized itching so I ordered Atarax 10 mg p.o.  I would not recommend additional doses given the patient's age. [CF]  0256 Discussed case in person with Dr. Leslye Peer with the hospitalist service.  We  discussed the case in detail and he will admit the patient for further management. [CF]    Clinical Course User Index [CF] Hinda Kehr, MD     Medications  hydrOXYzine (ATARAX) tablet 10 mg (has no administration in time range)  furosemide (LASIX) injection 40 mg (has no administration in time range)  enoxaparin (LOVENOX) injection 57.5 mg (has no administration in time range)  ondansetron (ZOFRAN) tablet 4 mg (has no administration in time range)    Or  ondansetron (ZOFRAN) injection 4 mg (has no administration in time range)  acetaminophen (TYLENOL) tablet 650 mg (has no administration in time range)    Or  acetaminophen (TYLENOL) suppository 650 mg (has no administration in time range)  aspirin EC tablet 81 mg (has no administration in time range)  atorvastatin (LIPITOR) tablet 40 mg (has no administration in time range)  losartan (COZAAR) tablet 25 mg (has no administration in time range)  spironolactone (ALDACTONE) tablet 25 mg (has no administration in time range)  donepezil (ARICEPT) tablet 10 mg (has no administration in time range)  traZODone (DESYREL) tablet 50 mg (has no administration in time range)  pantoprazole (PROTONIX) EC tablet 40 mg (has no administration in time range)  senna-docusate (Senokot-S) tablet 1 tablet (has no administration in time range)  finasteride (PROSCAR) tablet 5 mg (has no administration in time range)  tamsulosin (FLOMAX) capsule 0.4 mg (has no administration in time range)  cyanocobalamin (VITAMIN B12)  tablet 1,000 mcg (has no administration in time range)  diclofenac Sodium (VOLTAREN) 1 % topical gel 2 g (has no administration in time range)  lidocaine (LIDODERM) 5 % 2 patch (has no administration in time range)  metoprolol succinate (TOPROL-XL) 24 hr tablet 12.5 mg (has no administration in time range)  furosemide (LASIX) injection 40 mg (40 mg Intravenous Given 04/29/22 2235)  iohexol (OMNIPAQUE) 350 MG/ML injection 100 mL (100 mLs  Intravenous Contrast Given 04/30/22 0023)  acetaminophen (TYLENOL) tablet 1,000 mg (1,000 mg Oral Given 04/30/22 0229)     ED Discharge Orders     None      Final diagnoses:  Peripheral edema  Dyspnea, unspecified type  Acute on chronic congestive heart failure, unspecified heart failure type ([place])  Anasarca  Chronic indwelling Foley catheter  Neurogenic bladder     Hinda Kehr, MD 04/30/22 [phone]

## 2022-04-29 NOTE — ED Notes (Signed)
US at bedside

## 2022-04-30 ENCOUNTER — Inpatient Hospital Stay
Admit: 2022-04-30 | Discharge: 2022-04-30 | Disposition: A | Discharge status: Home / Self Care | Payer: Medicare Other | Attending: Internal Medicine | Admitting: Internal Medicine

## 2022-04-30 ENCOUNTER — Encounter: Payer: Self-pay | Admitting: Internal Medicine

## 2022-04-30 ENCOUNTER — Emergency Department: Payer: Medicare Other

## 2022-04-30 DIAGNOSIS — N401 Enlarged prostate with lower urinary tract symptoms: Secondary | ICD-10-CM | POA: Diagnosis present

## 2022-04-30 DIAGNOSIS — F0390 Unspecified dementia without behavioral disturbance: Secondary | ICD-10-CM | POA: Diagnosis present

## 2022-04-30 DIAGNOSIS — I509 Heart failure, unspecified: Secondary | ICD-10-CM | POA: Diagnosis not present

## 2022-04-30 DIAGNOSIS — R531 Weakness: Secondary | ICD-10-CM | POA: Diagnosis present

## 2022-04-30 DIAGNOSIS — Z85528 Personal history of other malignant neoplasm of kidney: Secondary | ICD-10-CM | POA: Diagnosis not present

## 2022-04-30 DIAGNOSIS — Z885 Allergy status to narcotic agent status: Secondary | ICD-10-CM | POA: Diagnosis not present

## 2022-04-30 DIAGNOSIS — Z79899 Other long term (current) drug therapy: Secondary | ICD-10-CM | POA: Diagnosis not present

## 2022-04-30 DIAGNOSIS — K592 Neurogenic bowel, not elsewhere classified: Secondary | ICD-10-CM

## 2022-04-30 DIAGNOSIS — Z905 Acquired absence of kidney: Secondary | ICD-10-CM | POA: Diagnosis not present

## 2022-04-30 DIAGNOSIS — R601 Generalized edema: Secondary | ICD-10-CM

## 2022-04-30 DIAGNOSIS — Z978 Presence of other specified devices: Secondary | ICD-10-CM

## 2022-04-30 DIAGNOSIS — Z6830 Body mass index (BMI) 30.0-30.9, adult: Secondary | ICD-10-CM | POA: Diagnosis not present

## 2022-04-30 DIAGNOSIS — M549 Dorsalgia, unspecified: Secondary | ICD-10-CM | POA: Diagnosis present

## 2022-04-30 DIAGNOSIS — Z7982 Long term (current) use of aspirin: Secondary | ICD-10-CM | POA: Diagnosis not present

## 2022-04-30 DIAGNOSIS — I110 Hypertensive heart disease with heart failure: Secondary | ICD-10-CM | POA: Diagnosis present

## 2022-04-30 DIAGNOSIS — N138 Other obstructive and reflux uropathy: Secondary | ICD-10-CM

## 2022-04-30 DIAGNOSIS — K5900 Constipation, unspecified: Secondary | ICD-10-CM

## 2022-04-30 DIAGNOSIS — I2510 Atherosclerotic heart disease of native coronary artery without angina pectoris: Secondary | ICD-10-CM | POA: Diagnosis present

## 2022-04-30 DIAGNOSIS — I5033 Acute on chronic diastolic (congestive) heart failure: Secondary | ICD-10-CM | POA: Diagnosis present

## 2022-04-30 DIAGNOSIS — I4891 Unspecified atrial fibrillation: Secondary | ICD-10-CM | POA: Diagnosis present

## 2022-04-30 DIAGNOSIS — G8929 Other chronic pain: Secondary | ICD-10-CM | POA: Diagnosis present

## 2022-04-30 DIAGNOSIS — R4189 Other symptoms and signs involving cognitive functions and awareness: Secondary | ICD-10-CM | POA: Diagnosis present

## 2022-04-30 DIAGNOSIS — Z981 Arthrodesis status: Secondary | ICD-10-CM | POA: Diagnosis not present

## 2022-04-30 DIAGNOSIS — E669 Obesity, unspecified: Secondary | ICD-10-CM

## 2022-04-30 DIAGNOSIS — Z87891 Personal history of nicotine dependence: Secondary | ICD-10-CM | POA: Diagnosis not present

## 2022-04-30 DIAGNOSIS — E785 Hyperlipidemia, unspecified: Secondary | ICD-10-CM | POA: Diagnosis present

## 2022-04-30 DIAGNOSIS — I441 Atrioventricular block, second degree: Secondary | ICD-10-CM | POA: Diagnosis not present

## 2022-04-30 DIAGNOSIS — Z8673 Personal history of transient ischemic attack (TIA), and cerebral infarction without residual deficits: Secondary | ICD-10-CM | POA: Diagnosis not present

## 2022-04-30 DIAGNOSIS — I10 Essential (primary) hypertension: Secondary | ICD-10-CM

## 2022-04-30 DIAGNOSIS — I5032 Chronic diastolic (congestive) heart failure: Secondary | ICD-10-CM

## 2022-04-30 DIAGNOSIS — N319 Neuromuscular dysfunction of bladder, unspecified: Secondary | ICD-10-CM | POA: Diagnosis present

## 2022-04-30 DIAGNOSIS — I442 Atrioventricular block, complete: Secondary | ICD-10-CM | POA: Diagnosis present

## 2022-04-30 LAB — BASIC METABOLIC PANEL
Anion gap: 10 (ref 5–15)
BUN: 22 mg/dL (ref 8–23)
CO2: 25 mmol/L (ref 22–32)
Calcium: 8.7 mg/dL — ABNORMAL LOW (ref 8.9–10.3)
Chloride: 102 mmol/L (ref 98–111)
Creatinine, Ser: 1.47 mg/dL — ABNORMAL HIGH (ref 0.61–1.24)
GFR, Estimated: 48 mL/min — ABNORMAL LOW (ref >60)
Glucose, Bld: 114 mg/dL — ABNORMAL HIGH (ref 70–99)
Potassium: 3.7 mmol/L (ref 3.5–5.1)
Sodium: 137 mmol/L (ref 135–145)

## 2022-04-30 MED ORDER — ACETAMINOPHEN 500 MG PO TABS: 500.0000 mg | ORAL_TABLET | Freq: Four times a day (QID) | ORAL | Status: DC | PRN

## 2022-04-30 MED ORDER — PANTOPRAZOLE SODIUM 40 MG PO TBEC
40.0000 mg | DELAYED_RELEASE_TABLET | Freq: Every day | ORAL | Status: DC
  Administered 2022-04-30 – 2022-05-03 (×4): 40 mg via ORAL
  Filled 2022-04-30 (×4): qty 1

## 2022-04-30 MED ORDER — SPIRONOLACTONE 25 MG PO TABS
25.0000 mg | ORAL_TABLET | Freq: Every day | ORAL | Status: DC
  Administered 2022-04-30 – 2022-05-03 (×4): 25 mg via ORAL
  Filled 2022-04-30 (×4): qty 1

## 2022-04-30 MED ORDER — FINASTERIDE 5 MG PO TABS
5.0000 mg | ORAL_TABLET | Freq: Every day | ORAL | Status: DC
  Administered 2022-04-30 – 2022-05-03 (×4): 5 mg via ORAL
  Filled 2022-04-30 (×5): qty 1

## 2022-04-30 MED ORDER — TRAZODONE HCL 50 MG PO TABS
50.0000 mg | ORAL_TABLET | Freq: Every evening | ORAL | Status: DC | PRN
  Administered 2022-04-30 – 2022-05-01 (×2): 50 mg via ORAL
  Filled 2022-04-30 (×2): qty 1

## 2022-04-30 MED ORDER — VITAMIN B-12 1000 MCG PO TABS
1000.0000 ug | ORAL_TABLET | Freq: Every day | ORAL | Status: DC
  Administered 2022-05-01 – 2022-05-03 (×3): 1000 ug via ORAL
  Filled 2022-04-30 (×2): qty 1
  Filled 2022-04-30: qty 2
  Filled 2022-04-30: qty 1

## 2022-04-30 MED ORDER — ASPIRIN 81 MG PO TBEC
81.0000 mg | DELAYED_RELEASE_TABLET | Freq: Every day | ORAL | Status: DC
  Administered 2022-04-30 – 2022-05-03 (×4): 81 mg via ORAL
  Filled 2022-04-30 (×4): qty 1

## 2022-04-30 MED ORDER — ACETAMINOPHEN 325 MG PO TABS
650.0000 mg | ORAL_TABLET | Freq: Four times a day (QID) | ORAL | Status: DC | PRN
  Administered 2022-04-30 – 2022-05-03 (×4): 650 mg via ORAL
  Filled 2022-04-30 (×4): qty 2

## 2022-04-30 MED ORDER — ATORVASTATIN CALCIUM 20 MG PO TABS
40.0000 mg | ORAL_TABLET | Freq: Every day | ORAL | Status: DC
  Administered 2022-04-30 – 2022-05-03 (×4): 40 mg via ORAL
  Filled 2022-04-30 (×4): qty 2

## 2022-04-30 MED ORDER — HYDROXYZINE HCL 10 MG PO TABS
10.0000 mg | ORAL_TABLET | Freq: Once | ORAL | Status: AC | PRN
  Administered 2022-05-01: 10 mg via ORAL
  Filled 2022-04-30: qty 1

## 2022-04-30 MED ORDER — SENNOSIDES-DOCUSATE SODIUM 8.6-50 MG PO TABS: 1.0000 | ORAL_TABLET | Freq: Two times a day (BID) | ORAL | Status: DC | PRN

## 2022-04-30 MED ORDER — ACETAMINOPHEN 500 MG PO TABS
1000.0000 mg | ORAL_TABLET | Freq: Once | ORAL | Status: AC
  Administered 2022-04-30: 1000 mg via ORAL

## 2022-04-30 MED ORDER — ONDANSETRON HCL 4 MG/2ML IJ SOLN: 4.0000 mg | Freq: Four times a day (QID) | INTRAMUSCULAR | Status: DC | PRN

## 2022-04-30 MED ORDER — FUROSEMIDE 10 MG/ML IJ SOLN
40.0000 mg | Freq: Two times a day (BID) | INTRAMUSCULAR | Status: DC
  Administered 2022-04-30 – 2022-05-01 (×4): 40 mg via INTRAVENOUS
  Filled 2022-04-30 (×4): qty 4

## 2022-04-30 MED ORDER — METOPROLOL SUCCINATE ER 25 MG PO TB24
12.5000 mg | ORAL_TABLET | Freq: Every day | ORAL | Status: DC
  Administered 2022-04-30 – 2022-05-01 (×2): 12.5 mg via ORAL
  Filled 2022-04-30 (×2): qty 1
  Filled 2022-04-30 (×2): qty 0.5

## 2022-04-30 MED ORDER — LIDOCAINE 5 % EX PTCH: 2.0000 | MEDICATED_PATCH | Freq: Every day | CUTANEOUS | Status: DC | PRN

## 2022-04-30 MED ORDER — TAMSULOSIN HCL 0.4 MG PO CAPS
0.4000 mg | ORAL_CAPSULE | Freq: Every day | ORAL | Status: DC
  Administered 2022-04-30 – 2022-05-03 (×4): 0.4 mg via ORAL
  Filled 2022-04-30 (×4): qty 1

## 2022-04-30 MED ORDER — LOSARTAN POTASSIUM 50 MG PO TABS
25.0000 mg | ORAL_TABLET | Freq: Every day | ORAL | Status: DC
  Administered 2022-04-30 – 2022-05-03 (×3): 25 mg via ORAL
  Filled 2022-04-30 (×4): qty 1

## 2022-04-30 MED ORDER — ONDANSETRON HCL 4 MG PO TABS: 4.0000 mg | ORAL_TABLET | Freq: Four times a day (QID) | ORAL | Status: DC | PRN

## 2022-04-30 MED ORDER — IOHEXOL 350 MG/ML SOLN
100.0000 mL | Freq: Once | INTRAVENOUS | Status: AC | PRN
  Administered 2022-04-30: 100 mL via INTRAVENOUS

## 2022-04-30 MED ORDER — ENOXAPARIN SODIUM 60 MG/0.6ML IJ SOSY
0.5000 mg/kg | PREFILLED_SYRINGE | INTRAMUSCULAR | Status: DC
  Administered 2022-04-30 – 2022-05-01 (×2): 57.5 mg via SUBCUTANEOUS
  Filled 2022-04-30 (×2): qty 0.6

## 2022-04-30 MED ORDER — DICLOFENAC SODIUM 1 % EX GEL: 2.0000 g | Freq: Two times a day (BID) | CUTANEOUS | Status: DC | PRN

## 2022-04-30 MED ORDER — DONEPEZIL HCL 5 MG PO TABS
10.0000 mg | ORAL_TABLET | Freq: Every day | ORAL | Status: DC
  Administered 2022-04-30 – 2022-05-01 (×2): 10 mg via ORAL
  Filled 2022-04-30 (×2): qty 2

## 2022-04-30 MED ORDER — ACETAMINOPHEN 650 MG RE SUPP: 650.0000 mg | Freq: Four times a day (QID) | RECTAL | Status: DC | PRN

## 2022-04-30 NOTE — Assessment & Plan Note (Signed)
Continue to monitor with diuresis

## 2022-04-30 NOTE — Assessment & Plan Note (Addendum)
BNP not that elevated, chest x-ray negative.  Patient is fluid overloaded.  Will diurese with IV Lasix.  Start low-dose Toprol.  Continue spironolactone and losartan.  Obtain echocardiogram

## 2022-04-30 NOTE — Progress Notes (Signed)
*  PRELIMINARY RESULTS* Echocardiogram 2D Echocardiogram has been performed.  Adam Rios 04/30/2022, 9:37 AM

## 2022-04-30 NOTE — ED Notes (Addendum)
Pt brought back from main lobby to room 26 by first nurse Lattie Haw via wheelchair. Pt states typically able to ambulate with roller walker at home. C/O increasing SOB with increased bilateral leg swelling causing decreased mobility. Pt was assisted by this RN, Lattie Haw RN, and EDP Malinda from wheelchair to bed. VS updated. 95% on room air. Noted to be Afib HR 120s on monitor. Per EDP Malinda, no repeat EKG. 20G IV placed to [place]. Pt repositioned with legs elevated d/t bilateral pitting edema. Chronic foley was emptied prior to arrival to room. New secure device placed for foley to left upper leg. Pt endorses compliance with PO lasix. Pt states discontinuation of blood thinner, unknown name, a few weeks ago per PCP recommendation. No recent medication change. Denies CP. C/o abdominal distention. Call bell within reach. Pt denies any additional needs at this time.

## 2022-04-30 NOTE — Assessment & Plan Note (Signed)
Continue spironolactone and losartan and will add Toprol-XL.

## 2022-04-30 NOTE — Progress Notes (Signed)
Brief same day note:  Patient is a 81 year old male with history of hypertension, morbid obesity, renal cell carcinoma status post nephrectomy, neurogenic bladder with chronic Foley catheter, lower extremity weakness ambulates with the help of rollator presents here with complaint of shortness of breath, lower extremity edema, weight gain.  On presentation, he was found to be severely volume overloaded with bilateral lower extremity edema. Patient was started on IV Lasix, echo has been ordered to rule out congestive heart failure. Patient seen and examined at bedside today.  He remains comfortable, states that he feels better.  Lying in bed.  Continues to have lower extremity edema and looks well overloaded. We will continue current management for now.  We will continue to monitor BMP, continue diuresis and get the echo report.

## 2022-04-30 NOTE — ED Notes (Signed)
Admitting provider at bedside.

## 2022-04-30 NOTE — ED Notes (Signed)
Pt's daughter [name] called requesting update. With pt's permission, daughter [name] was informed pt would be admitted into the hospital. [name] can be reached at [phone] with further updates.

## 2022-04-30 NOTE — Assessment & Plan Note (Signed)
Hopefully we can get a more accurate weight in the computer.

## 2022-04-30 NOTE — ED Notes (Signed)
Patient transported to CT 

## 2022-04-30 NOTE — Consult Note (Addendum)
   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  Mild left atrial enlargement.  Grade 1 diastolic dysfunction by echocardiogram performed in June 2023 at [facility].  Echocardiogram performed on this admission is currently pending.  Presented to the emergency room with complaints of gaining 10 to 12 pounds in 2 days, difficulty breathing, abdominal distention and lower extremity edema that extends to his groin.  BNP 119.  Comorbidities:  Hypertension Renal cell carcinoma status post nephrectomy Stroke Neurogenic bladder with chronic Foley Lower extremity paralysis  Medications:  Aspirin 81 mg daily Atorvastatin 40 mg daily Lasix 40 mg IV twice a day Losartan 25 mg daily Metoprolol succinate 12.5 mg daily Spironolactone 25 mg daily Flomax 0.4 mg daily  Labs:  Sodium 136, potassium 3.9, chloride 102, CO2 25, BUN 21, creatinine 1.11 Blood pressure 120/78 Documented weight is 114.1 kg   Initial meeting with patient, he is currently lying on a gurney in the ED in no acute distress.  There are no family members present at this time.  Patient states that he lives with his daughter and her family.  Discussed heart failure and what it means.  States that he does weigh himself daily and record.  Discussed the importance of fluid restriction of no more than 64 ounces in a days time.  He listed that he drinks 2-16.9 ounce bottles of water, 2 cups of coffee and a glass of iced tea in the evening.  Went over what constitutes a liquid, including the things that melt at room temperature that includes ice cream, Jell-O and pudding.   Discussed diet, he states that his noon meal is from [employer] Whe[employer] and his daughter is someone that prepares the evening meals.  She does not use any processed foods, and does not cook or add salt at the table.  Family members once to provide his transportation and his daughter is the one that sets up his medications in  medication box.  He  uses a [employer] for ambulation.  He sleeps in a recliner.   Was given the living with heart failure teaching booklet, zone magnet, info on low-sodium and heart failure along with weight chart.  Has follow-up in the outpatient heart failure clinic on December 8 at 10 AM he has a 4% no-show ratio is 1 out of 23 appointments.  Pricilla Riffle RN [facility]

## 2022-04-30 NOTE — Discharge Instructions (Signed)

## 2022-04-30 NOTE — H&P (Signed)
History and Physical    Patient: Adam Rios. BJS:[id number] DOB: [birth year] DOA: 04/29/2022 DOS: the patient was seen and examined on 04/30/2022 PCP: Dola Argyle, MD  Patient coming from: Home  Chief Complaint:  Chief Complaint  Patient presents with   Shortness of Breath   HPI: Adam Rios. is a 81 y.o. 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  Patient presents with gaining 6 pounds in a day and 10 to 11 pounds in 2 days.  He is also having trouble breathing.  His abdomen is distended and his legs are swollen all the way up to the groin.  Patient does not feeling comfortable.  Hospitalist services were contacted for admission for CHF. Review of Systems:  Review of Systems  Constitutional:  Negative for chills, fever, malaise/fatigue and weight loss.  HENT:  Negative for sore throat.   Eyes:  Negative for blurred vision.  Respiratory:  Positive for cough and shortness of breath.   Cardiovascular:  Positive for chest pain.  Gastrointestinal:  Positive for constipation. Negative for abdominal pain, nausea and vomiting.  Genitourinary:  Negative for dysuria.  Musculoskeletal:  Positive for joint pain and myalgias.  Skin:  Positive for rash.  Neurological:  Negative for dizziness.  Endo/Heme/Allergies:  Does not bruise/bleed easily.  Psychiatric/Behavioral:  Negative for depression.     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[place])    Hypertension    Neurogenic bladder    Renal cell carcinoma ([place]) 2005   Renal disorder    Stroke ([place])     01/17/18, 12/21   Some weakness and cognitive decline   Past Surgical History:  Procedure Laterality Date   APPENDECTOMY      CATARACT EXTRACTION W/PHACO Right 07/22/2020   Procedure: CATARACT EXTRACTION PHACO AND INTRAOCULAR LENS PLACEMENT ([place]) RIGHT;  Surgeon: Eulogio Bear, MD;  Location: [place];  Service: Ophthalmology;  Laterality: Right;  4.77 0:44.2   CATARACT EXTRACTION W/PHACO Left 08/05/2020   Procedure: CATARACT EXTRACTION PHACO AND INTRAOCULAR LENS PLACEMENT ([facility]) LEFT 10.73 01:13.8;  Surgeon: Eulogio Bear, MD;  Location: [place];  Service: Ophthalmology;  Laterality: Left;  Use eye stretcher not chair   HERNIA REPAIR     LUMBAR FUSION     NEPHRECTOMY Left 2005   Social History:  reports that he quit smoking about 18 years ago. His smoking use included cigarettes. He has a 2.50 pack-year smoking history. He has never used smokeless tobacco. He reports that he does not currently use alcohol. He reports that he does not use drugs.  Allergies  Allergen Reactions   Fentanyl Nausea Only   Oxycodone Nausea And Vomiting    Family History  Problem Relation Age of Onset   Cerebral aneurysm Father    Cerebral aneurysm Sister     Prior to Admission medications   Medication Sig Start Date End Date Taking? Authorizing Provider  acetaminophen (TYLENOL) 325 MG tablet Take 500 mg by mouth every 6 (six) hours as needed. 08/14/14  Yes [provider]  aspirin 81 MG EC tablet Take 81 mg by mouth daily as needed.   Yes [provider]  atorvastatin (LIPITOR) 40 MG tablet Take 40 mg by mouth daily.  Yes [provider]  diclofenac Sodium (VOLTAREN) 1 % GEL Apply 2 g topically 2 (two) times daily as needed. 05/11/21  Yes [provider]  donepezil (ARICEPT) 10 MG tablet Take 10 mg by mouth at bedtime.   Yes [provider]  finasteride (PROSCAR) 5 MG tablet Take 5 mg by mouth daily. 04/27/22  Yes [provider]  furosemide (LASIX) 20 MG tablet Take 20 mg by mouth 2 (two) times daily.   Yes [provider]  gabapentin  (NEURONTIN) 300 MG capsule Take 1 capsule (300 mg total) by mouth 2 (two) times daily. Patient taking differently: Take 300-600 mg by mouth as directed. 2 in the am, 4 after lunch (May split up lunch dose [1 after lunch and 3 at bedtime]). 11/28/21  Yes Sreenath, Sudheer B, MD  hydrocortisone cream ([employer] CORTISONE MAXIMUM STRENGTH) 1 % Apply 1 Application topically as needed for itching.   Yes [provider]  lidocaine (LIDODERM) 5 % Place 2 patches onto the skin daily as needed. 09/04/21  Yes [provider]  losartan (COZAAR) 25 MG tablet Hold until followup with your outpatient provider because your blood pressure was low in the hospital. Patient taking differently: Take 25 mg by mouth daily. 02/14/22  Yes Enzo Bi, MD  mirabegron ER (MYRBETRIQ) 25 MG TB24 tablet Take 25 mg by mouth daily.   Yes [provider]  omeprazole (PRILOSEC) 20 MG capsule Take 1 capsule (20 mg total) by mouth daily. 05/21/21  Yes Jennye Boroughs, MD  senna-docusate (SENOKOT-S) 8.6-50 MG tablet Take 1 tablet by mouth 2 (two) times daily. Patient taking differently: Take 1 tablet by mouth 2 (two) times daily as needed. 09/10/21  Yes Dwyane Dee, MD  spironolactone (ALDACTONE) 25 MG tablet Hold until followup with your outpatient provider because your blood pressure was low in the hospital. Patient taking differently: Take 25 mg by mouth daily. 02/14/22  Yes Enzo Bi, MD  tamsulosin (FLOMAX) 0.4 MG CAPS capsule Take 0.4 mg by mouth daily.   Yes [provider]  traZODone (DESYREL) 50 MG tablet Take 50-100 mg by mouth at bedtime. 08/12/21  Yes [provider]  vitamin B-12 (CYANOCOBALAMIN) 1000 MCG tablet Take 1,000 mcg by mouth daily.   Yes [provider]  furosemide (LASIX) 40 MG tablet Hold until followup with your outpatient provider because your blood pressure was low in the hospital. Patient not taking: Reported on 04/30/2022 02/14/22   Enzo Bi, MD    Physical  Exam: Vitals:   04/30/22 0232 04/30/22 0245 04/30/22 0300 04/30/22 0315  BP:      Pulse:  (!) 110 (!) 111 (!) 118  Resp:  16 17 (!) 21  Temp: 97.6 F (36.4 C)     TempSrc: Oral     SpO2:  97% 95% 93%   Physical Exam HENT:     Head: Normocephalic.     Mouth/Throat:     Pharynx: No oropharyngeal exudate.  Eyes:     General: Lids are normal.     Conjunctiva/sclera: Conjunctivae normal.  Cardiovascular:     Rate and Rhythm: Regular rhythm. Tachycardia present.     Heart sounds: Normal heart sounds, S1 normal and S2 normal.  Pulmonary:     Breath sounds: Examination of the right-lower field reveals decreased breath sounds. Examination of the left-lower field reveals decreased breath sounds. Decreased breath sounds present. No wheezing, rhonchi or rales.  Abdominal:     General: There is distension.  Palpations: Abdomen is soft.     Tenderness: There is abdominal tenderness.  Musculoskeletal:     Right lower leg: Swelling present.     Left lower leg: Swelling present.  Skin:    General: Skin is warm.     Findings: No rash.  Neurological:     Mental Status: He is alert and oriented to person, place, and time.     Data Reviewed: Chest x-ray negative CT angio of the chest was negative for acute pulmonary embolism, advanced coronary artery disease, bronchial wall thickening and air trapping compatible with chronic bronchitis or reactive airways CT abdomen pelvis no acute abnormality in abdomen or pelvis, enlarged prostate. Ultrasound lower extremity negative for DVT EKG interpreted by me shows normal sinus rhythm 78 bpm, left, low voltage, right bundle branch block, previous inferior infarct  Creatinine 1.11, BNP 119.6, troponin 30 and 35, hemoglobin 12.5, D-dimer 0.78  Assessment and Plan: * Anasarca Patient had a 10 to 11 pound weight gain in 2 days.  We will start Lasix 40 mg IV twice daily and monitor diuresis.  Start low-dose beta-blocker.  Continue spironolactone and  losartan.  Acute on chronic diastolic CHF (congestive heart failure) (HCC) BNP not that elevated, chest x-ray negative.  Patient is fluid overloaded.  Will diurese with IV Lasix.  Start low-dose Toprol.  Continue spironolactone and losartan.  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  Add MiraLAX.  BPH with obstruction/lower urinary tract symptoms Patient has a chronic Foley.  Renal cell carcinoma s/p nephrectomy, left Continue to monitor with diuresis  Obesity (BMI 30-39.9) Hopefully we can get a more accurate weight in the computer.  Essential hypertension Continue spironolactone and losartan and will add Toprol-XL.      Advance Care Planning:   Code Status: Full Code   Consults: None   Severity of Illness: The appropriate patient status for this patient is INPATIENT. Inpatient status is judged to be reasonable and necessary in order to provide the required intensity of service to ensure the patient's safety. The patient's presenting symptoms, physical exam findings, and initial radiographic and laboratory data in the context of their chronic comorbidities is felt to place them at high risk for further clinical deterioration. Furthermore, it is not anticipated that the patient will be medically stable for discharge from the hospital within 2 midnights of admission.   * I certify that at the point of admission it is my clinical judgment that the patient will require inpatient hospital care spanning beyond 2 midnights from the point of admission due to high intensity of service, high risk for further deterioration and high frequency of surveillance required.*  Author: Loletha Grayer, MD 04/30/2022 3:30 AM  For on call review [email or web address].

## 2022-04-30 NOTE — Assessment & Plan Note (Signed)
Continue anticonstipation medications.  Add MiraLAX.

## 2022-04-30 NOTE — Assessment & Plan Note (Signed)
Patient had a 10 to 11 pound weight gain in 2 days.  We will start Lasix 40 mg IV twice daily and monitor diuresis.  Start low-dose beta-blocker.  Continue spironolactone and losartan.

## 2022-04-30 NOTE — Progress Notes (Signed)
PHARMACIST - PHYSICIAN COMMUNICATION  CONCERNING:  Enoxaparin (Lovenox) for DVT Prophylaxis    RECOMMENDATION: Patient was prescribed enoxaprin '40mg'$  q24 hours for VTE prophylaxis.   Filed Weights   04/30/22 0548  Weight: 114.1 kg (251 lb 8.7 oz)    Body mass index is 33.19 kg/m.  Estimated Creatinine Clearance: 69.1 mL/min (by C-G formula based on SCr of 1.11 mg/dL).   Based on [place] patient is candidate for enoxaparin 0.'5mg'$ /kg TBW SQ every 24 hours based on BMI being >30.  DESCRIPTION: Pharmacy has adjusted enoxaparin dose per [facility] policy.  Patient is now receiving enoxaparin 0.5 mg/kg every 24 hours   Renda Rolls, PharmD, [facility] 04/30/2022 5:50 AM

## 2022-04-30 NOTE — Assessment & Plan Note (Signed)
Patient has a chronic Foley.

## 2022-05-01 DIAGNOSIS — R601 Generalized edema: Secondary | ICD-10-CM | POA: Diagnosis not present

## 2022-05-01 LAB — CBC
HCT: 36.8 % — ABNORMAL LOW (ref 39.0–52.0)
Hemoglobin: 12.1 g/dL — ABNORMAL LOW (ref 13.0–17.0)
MCH: 26.9 pg (ref 26.0–34.0)
MCHC: 32.9 g/dL (ref 30.0–36.0)
MCV: 82 fL (ref 80.0–100.0)
Platelets: 218 10*3/uL (ref 150–400)
RBC: 4.49 MIL/uL (ref 4.22–5.81)
RDW: 15.2 % (ref 11.5–15.5)
WBC: 6.8 10*3/uL (ref 4.0–10.5)
nRBC: 0 % (ref 0.0–0.2)

## 2022-05-01 LAB — BASIC METABOLIC PANEL
Anion gap: 8 (ref 5–15)
BUN: 24 mg/dL — ABNORMAL HIGH (ref 8–23)
CO2: 27 mmol/L (ref 22–32)
Calcium: 8.6 mg/dL — ABNORMAL LOW (ref 8.9–10.3)
Chloride: 103 mmol/L (ref 98–111)
Creatinine, Ser: 1.22 mg/dL (ref 0.61–1.24)
GFR, Estimated: 60 mL/min — ABNORMAL LOW (ref >60)
Glucose, Bld: 100 mg/dL — ABNORMAL HIGH (ref 70–99)
Potassium: 3.5 mmol/L (ref 3.5–5.1)
Sodium: 138 mmol/L (ref 135–145)

## 2022-05-01 LAB — URINE CULTURE

## 2022-05-01 LAB — ECHOCARDIOGRAM COMPLETE
S' Lateral: 3 cm
Weight: 4024.72 oz

## 2022-05-01 LAB — MRSA NEXT GEN BY PCR, NASAL: MRSA by PCR Next Gen: DETECTED — AB

## 2022-05-01 MED ORDER — CHLORHEXIDINE GLUCONATE CLOTH 2 % EX PADS
6.0000 | MEDICATED_PAD | Freq: Every day | CUTANEOUS | Status: DC
  Administered 2022-05-02 – 2022-05-03 (×2): 6 via TOPICAL

## 2022-05-01 MED ORDER — MUPIROCIN 2 % EX OINT
1.0000 | TOPICAL_OINTMENT | Freq: Two times a day (BID) | CUTANEOUS | Status: DC
  Administered 2022-05-01 – 2022-05-03 (×5): 1 via NASAL
  Filled 2022-05-01: qty 22

## 2022-05-01 MED ORDER — SENNOSIDES-DOCUSATE SODIUM 8.6-50 MG PO TABS
1.0000 | ORAL_TABLET | Freq: Two times a day (BID) | ORAL | Status: DC
  Filled 2022-05-01 (×4): qty 1

## 2022-05-01 MED ORDER — POLYETHYLENE GLYCOL 3350 17 G PO PACK
17.0000 g | PACK | Freq: Every day | ORAL | Status: DC
  Filled 2022-05-01 (×3): qty 1

## 2022-05-01 MED ORDER — CHLORHEXIDINE GLUCONATE CLOTH 2 % EX PADS
6.0000 | MEDICATED_PAD | Freq: Every day | CUTANEOUS | Status: DC
  Administered 2022-05-01 – 2022-05-03 (×3): 6 via TOPICAL

## 2022-05-01 MED ORDER — ENOXAPARIN SODIUM 60 MG/0.6ML IJ SOSY
0.5000 mg/kg | PREFILLED_SYRINGE | INTRAMUSCULAR | Status: DC
  Administered 2022-05-02 – 2022-05-03 (×2): 57.5 mg via SUBCUTANEOUS
  Filled 2022-05-01 (×2): qty 0.6

## 2022-05-01 NOTE — Progress Notes (Signed)
PROGRESS NOTE  [name].  QBH:[id number] DOB: Apr 18, 1941 DOA: 04/29/2022 PCP: Dola Argyle, MD   Brief Narrative: Patient is a 81 year old male with history of hypertension, morbid obesity, renal cell carcinoma status post nephrectomy, neurogenic bladder with chronic Foley catheter, lower extremity weakness ambulates with the help of rollator presents here with complaint of shortness of breath, lower extremity edema, weight gain.  On presentation, he was found to be severely volume overloaded with bilateral lower extremity edema. Patient was started on IV Lasix which we will continue today.    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 Started on IV Lasix.  Echo showed EF of 50 to 09%, grade 1 diastolic dysfunction. Significant improvement in the anasarca today but will continue IV Lasix, since there is still fluid reserve to be taken out.  Continue spironolactone and losartan. Takes oral Lasix at home  Hypertension: Currently blood pressure stable.  Continue spironolactone, losartan, Toprol  Constipation: Continue bowel regimen  Hyperlipidemia: On Lipitor  History of BPH/lower urinary tract symptoms/obstruction: Has chronic Foley catheter.  Follows with urology.  Continue Proscar, Myrbetriq, tamsulosin  History of renal cell carcinoma: Status post nephrectomy left side.  Currently kidney function stable.  Obesity: BMI 32.8  Weakness/deconditioning: Ordered physical therapy, OT ASSESSMENT        DVT prophylaxis:Lovenox     Code  Status: Full Code  Family Communication: Called and discussed with daughter on phone on 12/1  Patient status:Inpatient  Patient is from :Home  Anticipated discharge BD:[facility]  Estimated DC date:None at bedside   Consultants: None  Procedures:None  Antimicrobials:  Anti-infectives (From admission, onward)    None       Subjective: Patient seen and examined the bedside today.  Remains comfortable, lying in bed.  Hemodynamically stable.  Significant improvement in the lower extremity edema today.  Had diuresis of about 4.5 L since admission  Objective: Vitals:   05/01/22 0600 05/01/22 0621 05/01/22 0651 05/01/22 0933  BP: 124/73  111/71 126/67  Pulse: 70  68 72  Resp: '14  20 20  '$ Temp:  97.7 F (36.5 C) 97.6 F (36.4 C) 98.7 F (37.1 C)  TempSrc:  Oral  Oral  SpO2: 92%  93% 92%  Weight:      Height:        Intake/Output Summary (Last 24 hours) at 05/01/2022 1137 Last data filed at 05/01/2022 1000 Gross per 24 hour  Intake 240 ml  Output --  Net 240 ml   Filed Weights   04/30/22 0548 04/30/22 1825  Weight: 114.1 kg 112.9 kg    Examination:  General exam: Overall comfortable, not in distress,obese, deconditioned, obese HEENT: PERRL Respiratory system:  no wheezes or crackles  Cardiovascular system: S1 & S2 heard, RRR.  Gastrointestinal system: Abdomen is nondistended, soft and nontender. Central nervous system: Alert and oriented Extremities: LE edema, no clubbing ,no cyanosis Skin: No rashes, no ulcers,no icterus   GU: Foley catheter   Data Reviewed: I have personally reviewed following labs and imaging studies  CBC: Recent Labs  Lab 04/29/22 1632 05/01/22 0738  WBC 8.6 6.8  HGB 12.5* 12.1*  HCT 41.1 36.8*  MCV 86.3 82.0  PLT 216 563   Basic Metabolic Panel: Recent Labs  Lab 04/29/22 1632 04/30/22 1453 05/01/22 0738  NA 136 137 138  K 3.9 3.7 3.5  CL 102 102 103  CO2 '25 25 27  '$ GLUCOSE 96 114* 100*  BUN 21 22 24*  CREATININE 1.11  1.47* 1.22  CALCIUM 9.1 8.7* 8.6*     Recent Results (from the past 240 hour(s))  Urine Culture     Status: Abnormal   Collection Time: 04/30/22  1:47 AM   Specimen: Urine, Random  Result Value Ref Range Status   Specimen Description   Final    URINE, RANDOM Performed at [facility], [address], [place], [place] [address]    Special Requests   Final    NONE Performed at [facility], [address] [place], [place] [address]    Culture MULTIPLE SPECIES PRESENT, SUGGEST RECOLLECTION (A)  Final   Report Status 05/01/2022 FINAL  Final     Radiology Studies: ECHOCARDIOGRAM COMPLETE  Result Date: 05/01/2022    ECHOCARDIOGRAM REPORT   Patient Name:   Jaxxen Voong. Date of Exam: 04/30/2022 Medical Rec #:  [id number]          Height:       73.0 in Accession #:    [id number]         Weight:       251.5 lb Date of Birth:  Jun 18, 1940           BSA:          2.372 m Patient Age:    65 years           BP:           Not listed in chart/Not listed                                                  in chart mmHg Patient Gender: M                  HR:           72 bpm. Exam Location:  [facility] Procedure: 2D Echo, Cardiac Doppler and Color Doppler Indications:     CHF-acute diastolic T55.73  History:         Patient has no prior history of Echocardiogram examinations.                  Stroke; Risk Factors:Hypertension.  Sonographer:     Sherrie Sport Referring Phys:  [id number] Loletha Grayer Diagnosing Phys: Yolonda Kida MD  Sonographer Comments: Technically challenging study due to limited acoustic windows, no apical window and no subcostal window. IMPRESSIONS  1. Left ventricular ejection fraction, by estimation, is 50 to 55%. The left ventricle has low normal function. The left ventricle has no regional wall motion abnormalities. There is moderate concentric left ventricular hypertrophy. Left ventricular diastolic parameters are consistent with Grade I diastolic dysfunction (impaired  relaxation).  2. Right ventricular systolic function was not well visualized. The right ventricular size is normal. Mildly increased right ventricular wall thickness.  3. Left atrial size was mild to moderately dilated.  4. Right atrial size was mild to moderately dilated.  5. The mitral valve is normal in structure. No evidence of  mitral valve regurgitation.  6. The aortic valve is normal in structure. Aortic valve regurgitation is not visualized. FINDINGS  Left Ventricle: Left ventricular ejection fraction, by estimation, is 50 to 55%. The left ventricle has low normal function. The left ventricle has no regional wall motion abnormalities. The left ventricular internal cavity size was normal in size. There is moderate concentric left ventricular hypertrophy. Left ventricular diastolic parameters are consistent with Grade I diastolic dysfunction (impaired relaxation). Right Ventricle: The right ventricular size is normal. Mildly increased right ventricular wall thickness. Right ventricular systolic function was not well visualized. Left Atrium: Left atrial size was mild to moderately dilated. Right Atrium: Right atrial size was mild to moderately dilated. Pericardium: There is no evidence of pericardial effusion. Mitral Valve: The mitral valve is normal in structure. No evidence of mitral valve regurgitation. Tricuspid Valve: The tricuspid valve is normal in structure. Tricuspid valve regurgitation is not demonstrated. Aortic Valve: The aortic valve is normal in structure. Aortic valve regurgitation is not visualized. Pulmonic Valve: The pulmonic valve was normal in structure. Pulmonic valve regurgitation is not visualized. Aorta: The ascending aorta was not well visualized. IAS/Shunts: No atrial level shunt detected by color flow Doppler.  LEFT VENTRICLE PLAX 2D LVIDd:         4.10 cm LVIDs:         3.00 cm LV PW:         1.50 cm LV IVS:        1.70 cm LVOT diam:     2.20 cm LVOT Area:     3.80 cm  LEFT ATRIUM          Index LA diam:    4.80 cm 2.02 cm/m   AORTA Ao Root diam: 4.20 cm  SHUNTS Systemic Diam: 2.20 cm Yolonda Kida MD Electronically signed by Yolonda Kida MD Signature Date/Time: 05/01/2022/8:37:38 AM    Final    CT Angio Chest PE W and/or Wo Contrast  Result Date: 04/30/2022 CLINICAL DATA:  Increased weight gain and shortness of breath Positive D-dimer EXAM: CT ANGIOGRAPHY CHEST CT ABDOMEN AND PELVIS WITH CONTRAST TECHNIQUE: Multidetector CT imaging of the chest was performed using the standard protocol during bolus administration of intravenous contrast. Multiplanar CT image reconstructions and MIPs were obtained to evaluate the vascular anatomy. Multidetector CT imaging of the abdomen and pelvis was performed using the standard protocol during bolus administration of intravenous contrast. RADIATION DOSE REDUCTION: This exam was performed according to the departmental dose-optimization program which includes automated exposure control, adjustment of the mA and/or kV according to patient size and/or use of iterative reconstruction technique. CONTRAST:  12m OMNIPAQUE IOHEXOL 350 MG/ML SOLN COMPARISON:  CT abdomen and pelvis 11/21/2021; CT chest abdomen and pelvis 09/07/2021 FINDINGS: CTA CHEST FINDINGS Cardiovascular: Satisfactory opacification of the pulmonary arteries to the segmental level. No evidence of pulmonary embolism. Normal heart size. Advanced coronary artery calcification. Trace pericardial effusion. Mediastinum/Nodes: Similar prominent mediastinal adenopathy. Thyroid gland, trachea, and esophagus demonstrate no significant findings. Lungs/Pleura: Expiratory phase imaging. Air trapping bilaterally. Bronchial wall thickening and mucous plugging greatest in the lower lobes. No focal consolidation, pleural effusion, or pneumothorax. Musculoskeletal: No chest wall abnormality. No acute osseous findings. Review of the MIP images confirms the above findings. CT ABDOMEN and PELVIS FINDINGS  Hepatobiliary: No acute abnormality. 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 No follow-up recommended. Unremarkable gallbladder. No biliary dilation. Pancreas: Unremarkable. No pancreatic ductal dilatation or surrounding inflammatory changes. Spleen: Normal in size without focal abnormality.  Adrenals/Urinary Tract: Normal adrenal glands. Left nephrectomy. Unremarkable right kidney. No urinary calculi or hydronephrosis. Foley catheter in nondistended bladder. Stomach/Bowel: Normal caliber large and small bowel. The appendix is not visualized. Vascular/Lymphatic: Advanced aorto bi-iliac calcified atherosclerosis. No aneurysm. No suspicious lymphadenopathy. Reproductive: Enlarged prostate. Other: No free intraperitoneal fluid or air. 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 Review of the MIP images confirms the above findings. IMPRESSION: 1. Negative for acute pulmonary embolism. 2. Advanced coronary artery and Aortic Atherosclerosis (ICD10-I70.0). 3. Bronchial wall thickening and air trapping compatible with chronic bronchitis/reactive airways. 4. No acute abnormality in the abdomen or pelvis. 5. Enlarged prostate. Electronically Signed   By: Placido Sou M.D.   On: 04/30/2022 00:50   CT ABDOMEN PELVIS W CONTRAST  Result Date: 04/30/2022 CLINICAL DATA:  Increased weight gain and shortness of breath Positive D-dimer EXAM: CT ANGIOGRAPHY CHEST CT ABDOMEN AND PELVIS WITH CONTRAST TECHNIQUE: Multidetector CT imaging of the chest was performed using the standard protocol during bolus administration of intravenous contrast. Multiplanar CT image reconstructions and MIPs were obtained to evaluate the vascular anatomy. Multidetector CT imaging of the abdomen and pelvis was performed using the standard protocol during bolus administration of intravenous contrast. RADIATION DOSE REDUCTION: This exam was performed according to the departmental dose-optimization  program which includes automated exposure control, adjustment of the mA and/or kV according to patient size and/or use of iterative reconstruction technique. CONTRAST:  172m OMNIPAQUE IOHEXOL 350 MG/ML SOLN COMPARISON:  CT abdomen and pelvis 11/21/2021; CT chest abdomen and pelvis 09/07/2021 FINDINGS: CTA CHEST FINDINGS Cardiovascular: Satisfactory opacification of the pulmonary arteries to the segmental level. No evidence of pulmonary embolism. Normal heart size. Advanced coronary artery calcification. Trace pericardial effusion. Mediastinum/Nodes: Similar prominent mediastinal adenopathy. Thyroid gland, trachea, and esophagus demonstrate no significant findings. Lungs/Pleura: Expiratory phase imaging. Air trapping bilaterally. Bronchial wall thickening and mucous plugging greatest in the lower lobes. No focal consolidation, pleural effusion, or pneumothorax. Musculoskeletal: No chest wall abnormality. No acute osseous findings. Review of the MIP images confirms the above findings. CT ABDOMEN and PELVIS FINDINGS Hepatobiliary: No acute abnormality. 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 No follow-up recommended. Unremarkable gallbladder. No biliary dilation. Pancreas: Unremarkable. No pancreatic ductal dilatation or surrounding inflammatory changes. Spleen: Normal in size without focal abnormality. Adrenals/Urinary Tract: Normal adrenal glands. Left nephrectomy. Unremarkable right kidney. No urinary calculi or hydronephrosis. Foley catheter in nondistended bladder. Stomach/Bowel: Normal caliber large and small bowel. The appendix is not visualized. Vascular/Lymphatic: Advanced aorto bi-iliac calcified atherosclerosis. No aneurysm. No suspicious lymphadenopathy. Reproductive: Enlarged prostate. Other: No free intraperitoneal fluid or air. 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 Review of the MIP images confirms the above findings. IMPRESSION: 1. Negative for  acute pulmonary embolism. 2. Advanced coronary artery and Aortic Atherosclerosis (ICD10-I70.0). 3. Bronchial wall thickening and air trapping compatible with chronic bronchitis/reactive airways. 4. No acute abnormality in the abdomen or pelvis. 5. Enlarged prostate. Electronically Signed   By: TPlacido SouM.D.   On: 04/30/2022 00:50   [place]Venous Img Lower Bilateral  Result Date: 04/30/2022 CLINICAL DATA:  Bilateral lower extremity edema EXAM: BILATERAL LOWER EXTREMITY VENOUS DOPPLER ULTRASOUND TECHNIQUE: Gray-scale sonography with compression, as well as color and duplex ultrasound, were performed to evaluate the deep venous system(s) from the level of the common femoral vein through the popliteal and proximal calf veins. COMPARISON:  None Available. FINDINGS: VENOUS Normal compressibility of the common femoral, superficial femoral, and popliteal veins, as well as  the visualized calf veins. Visualized portions of profunda femoral vein and great saphenous vein unremarkable. No filling defects to suggest DVT on grayscale or color Doppler imaging. Doppler waveforms show normal direction of venous flow, normal respiratory plasticity and response to augmentation. Limited views of the contralateral common femoral vein are unremarkable. OTHER None. Limitations: none IMPRESSION: Negative. Electronically Signed   By: Ulyses Jarred M.D.   On: 04/30/2022 00:36   US Abdomen Limited  Result Date: 04/30/2022 CLINICAL DATA:  Abdominal distension EXAM: LIMITED ABDOMEN ULTRASOUND FOR ASCITES TECHNIQUE: Limited ultrasound survey for ascites was performed in all four abdominal quadrants. COMPARISON:  None Available. FINDINGS: No ascites visualized. IMPRESSION: No ascites visualized. Electronically Signed   By: Ulyses Jarred M.D.   On: 04/30/2022 00:31   DG Chest 2 View  Result Date: 04/29/2022 CLINICAL DATA:  Shortness of breath EXAM: CHEST - 2 VIEW COMPARISON:  Radiographs 02/11/2022 FINDINGS: Stable  cardiomediastinal silhouette. Aortic atherosclerotic calcification. Calcified mediastinal nodes. No focal consolidation, pleural effusion, or pneumothorax. No acute osseous abnormality. Elevated right hemidiaphragm. IMPRESSION: No active cardiopulmonary disease. Electronically Signed   By: Placido Sou M.D.   On: 04/29/2022 17:07    Scheduled Meds:  aspirin EC  81 mg Oral Daily   atorvastatin  40 mg Oral Daily   Chlorhexidine Gluconate Cloth  6 each Topical Daily   cyanocobalamin  1,000 mcg Oral Daily   donepezil  10 mg Oral QHS   enoxaparin (LOVENOX) injection  0.5 mg/kg Subcutaneous Q24H   finasteride  5 mg Oral Daily   furosemide  40 mg Intravenous BID   losartan  25 mg Oral Daily   metoprolol succinate  12.5 mg Oral Daily   pantoprazole  40 mg Oral Daily   spironolactone  25 mg Oral Daily   tamsulosin  0.4 mg Oral Daily   Continuous Infusions:   LOS: 1 day   Shelly Coss, MD [facility] P12/06/2021, 11:37 AM

## 2022-05-01 NOTE — ED Notes (Signed)
ED TO INPATIENT HANDOFF REPORT  ED Nurse Name and Phone #: Mel Almond [phone]  S Name/Age/Gender Adam Rios. 81 y.o. male Room/Bed: ED31A/ED31A  Code Status   Code Status: Full Code  Home/SNF/Other Home Patient oriented to: self, place, time, and situation Is this baseline? Yes   Triage Complete: Triage complete  Chief Complaint CHF (congestive heart failure) ([place]) [I50.9]  Triage Note Pt comes via EMS from home with c/o increased weight gain and sob. Pt stats he takes lasix. Pt stats he might have UTI as well.    Allergies Allergies  Allergen Reactions   Fentanyl Nausea Only   Oxycodone Nausea And Vomiting    Level of Care/Admitting Diagnosis ED Disposition     ED Disposition  Admit   Condition  --   [place]: [place] [[id number]]  Level of Care: Progressive [102]  Admit to Progressive based on following criteria: CARDIOVASCULAR & THORACIC of moderate stability with acute coronary syndrome symptoms/low risk myocardial infarction/hypertensive urgency/arrhythmias/heart failure potentially compromising stability and stable post cardiovascular intervention patients.  Covid Evaluation: Asymptomatic - no recent exposure (last 10 days) testing not required  Diagnosis: CHF (congestive heart failure) [facility]) [[id number]]  Admitting Physician: Loletha Grayer [[id number]]  Attending Physician: Loletha Grayer [[id number]]  Certification:: I certify this patient will need inpatient services for at least 2 midnights  Estimated Length of Stay: 2          B Medical/Surgery History 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[place])    Hypertension    Neurogenic bladder    Renal cell carcinoma ([place]) 2005   Renal disorder    Stroke ([place])     01/17/18, 12/21   Some weakness and cognitive decline   Past Surgical History:   Procedure Laterality Date   APPENDECTOMY     CATARACT EXTRACTION W/PHACO Right 07/22/2020   Procedure: CATARACT EXTRACTION PHACO AND INTRAOCULAR LENS PLACEMENT ([place]) RIGHT;  Surgeon: Eulogio Bear, MD;  Location: [place];  Service: Ophthalmology;  Laterality: Right;  4.77 0:44.2   CATARACT EXTRACTION W/PHACO Left 08/05/2020   Procedure: CATARACT EXTRACTION PHACO AND INTRAOCULAR LENS PLACEMENT ([facility]) LEFT 10.73 01:13.8;  Surgeon: Eulogio Bear, MD;  Location: [place];  Service: Ophthalmology;  Laterality: Left;  Use eye stretcher not chair   HERNIA REPAIR     LUMBAR FUSION     NEPHRECTOMY Left 2005     A IV Location/Drains/Wounds Patient Lines/Drains/Airways Status     Active Line/Drains/Airways     Name Placement date Placement time Site Days   Peripheral IV 04/29/22 20 G 1" Left Antecubital 04/29/22  2233  Antecubital  2   Midline Single Lumen 04/88/89 Left Basilic 10 cm 0 cm 16/94/50  3888  Basilic  280   Urethral Catheter Jess, RN Non-latex 14 Fr. 02/11/22  1027  Non-latex  79            Intake/Output Last 24 hours  Intake/Output Summary (Last 24 hours) at 05/01/2022 0548 Last data filed at 04/30/2022 1035 Gross per 24 hour  Intake --  Output 1750 ml  Net -1750 ml    Labs/Imaging Results for orders placed or performed during the hospital encounter of 04/29/22 (from the past 48 hour(s))  Basic metabolic panel     Status: None   Collection Time: 04/29/22  4:32 PM  Result  Value Ref Range   Sodium 136 135 - 145 mmol/L   Potassium 3.9 3.5 - 5.1 mmol/L   Chloride 102 98 - 111 mmol/L   CO2 25 22 - 32 mmol/L   Glucose, Bld 96 70 - 99 mg/dL    Comment: Glucose reference range applies only to samples taken after fasting for at least 8 hours.   BUN 21 8 - 23 mg/dL   Creatinine, Ser 1.11 0.61 - 1.24 mg/dL   Calcium 9.1 8.9 - 10.3 mg/dL   GFR, Estimated >60 >60 mL/min    Comment: (NOTE) Calculated using the CKD-EPI Creatinine Equation  (2021)    Anion gap 9 5 - 15    Comment: Performed at [facility], [address] [place], [place] [address]  CBC     Status: Abnormal   Collection Time: 04/29/22  4:32 PM  Result Value Ref Range   WBC 8.6 4.0 - 10.5 K/uL   RBC 4.76 4.22 - 5.81 MIL/uL   Hemoglobin 12.5 (L) 13.0 - 17.0 g/dL   HCT 41.1 39.0 - 52.0 %   MCV 86.3 80.0 - 100.0 fL   MCH 26.3 26.0 - 34.0 pg   MCHC 30.4 30.0 - 36.0 g/dL   RDW 15.1 11.5 - 15.5 %   Platelets 216 150 - 400 K/uL   nRBC 0.0 0.0 - 0.2 %    Comment: Performed at [facility], [address] [place], [place] [address]  Troponin I (High Sensitivity)     Status: Abnormal   Collection Time: 04/29/22  4:32 PM  Result Value Ref Range   Troponin I (High Sensitivity) 30 (H) <18 ng/L    Comment: (NOTE) Elevated high sensitivity troponin I (hsTnI) values and significant  changes across serial measurements may suggest ACS but many other  chronic and acute conditions are known to elevate hsTnI results.  Refer to the "Links" section for chest pain algorithms and additional  guidance. Performed at [facility], [address] [place], [place] [address]   Brain natriuretic peptide     Status: Abnormal   Collection Time: 04/29/22  4:42 PM  Result Value Ref Range   B Natriuretic Peptide 119.6 (H) 0.0 - 100.0 pg/mL    Comment: Performed at [facility], [place], [place] [address]  Troponin I (High Sensitivity)     Status: Abnormal   Collection Time: 04/29/22  8:59 PM  Result Value Ref Range   Troponin I (High Sensitivity) 35 (H) <18 ng/L    Comment: (NOTE) Elevated high sensitivity troponin I (hsTnI) values and significant  changes across serial measurements may suggest ACS but many other  chronic and acute conditions are known to elevate hsTnI results.  Refer to the "Links" section for chest pain algorithms and additional  guidance. Performed at [facility], [address]  [place], [place] [address]   Hepatic function panel     Status: Abnormal   Collection Time: 04/29/22 10:30 PM  Result Value Ref Range   Total Protein 8.3 (H) 6.5 - 8.1 g/dL   Albumin 4.2 3.5 - 5.0 g/dL   AST 27 15 - 41 U/L   ALT 20 0 - 44 U/L   Alkaline Phosphatase 79 38 - 126 U/L   Total Bilirubin 1.2 0.3 - 1.2 mg/dL   Bilirubin, Direct 0.1 0.0 - 0.2 mg/dL   Indirect Bilirubin 1.1 (H) 0.3 - 0.9 mg/dL    Comment: Performed at [facility], [address] [place], [place] [address]  D-dimer, quantitative     Status: Abnormal   Collection Time: 04/29/22 10:43 PM  Result Value Ref Range   D-Dimer, Quant 0.78 (H) 0.00 - 0.50 ug/mL-FEU    Comment: (NOTE) At the manufacturer cut-off value of 0.5 g/mL FEU, this assay has a negative predictive value of 95-100%.This assay is intended for use in conjunction with a clinical pretest probability (PTP) assessment model to exclude pulmonary embolism (PE) and deep venous thrombosis (DVT) in outpatients suspected of PE or DVT. Results should be correlated with clinical presentation. Performed at [facility], [address] [place], [place] [address]   Basic metabolic panel     Status: Abnormal   Collection Time: 04/30/22  2:53 PM  Result Value Ref Range   Sodium 137 135 - 145 mmol/L   Potassium 3.7 3.5 - 5.1 mmol/L   Chloride 102 98 - 111 mmol/L   CO2 25 22 - 32 mmol/L   Glucose, Bld 114 (H) 70 - 99 mg/dL    Comment: Glucose reference range applies only to samples taken after fasting for at least 8 hours.   BUN 22 8 - 23 mg/dL   Creatinine, Ser 1.47 (H) 0.61 - 1.24 mg/dL   Calcium 8.7 (L) 8.9 - 10.3 mg/dL   GFR, Estimated 48 (L) >60 mL/min    Comment: (NOTE) Calculated using the CKD-EPI Creatinine Equation (2021)    Anion gap 10 5 - 15    Comment: Performed at [facility], [address] [place], [place] [address]   CT Angio Chest PE W and/or Wo Contrast  Result Date: 04/30/2022 CLINICAL DATA:   Increased weight gain and shortness of breath Positive D-dimer EXAM: CT ANGIOGRAPHY CHEST CT ABDOMEN AND PELVIS WITH CONTRAST TECHNIQUE: Multidetector CT imaging of the chest was performed using the standard protocol during bolus administration of intravenous contrast. Multiplanar CT image reconstructions and MIPs were obtained to evaluate the vascular anatomy. Multidetector CT imaging of the abdomen and pelvis was performed using the standard protocol during bolus administration of intravenous contrast. RADIATION DOSE REDUCTION: This exam was performed according to the departmental dose-optimization program which includes automated exposure control, adjustment of the mA and/or kV according to patient size and/or use of iterative reconstruction technique. CONTRAST:  175m OMNIPAQUE IOHEXOL 350 MG/ML SOLN COMPARISON:  CT abdomen and pelvis 11/21/2021; CT chest abdomen and pelvis 09/07/2021 FINDINGS: CTA CHEST FINDINGS Cardiovascular: Satisfactory opacification of the pulmonary arteries to the segmental level. No evidence of pulmonary embolism. Normal heart size. Advanced coronary artery calcification. Trace pericardial effusion. Mediastinum/Nodes: Similar prominent mediastinal adenopathy. Thyroid gland, trachea, and esophagus demonstrate no significant findings. Lungs/Pleura: Expiratory phase imaging. Air trapping bilaterally. Bronchial wall thickening and mucous plugging greatest in the lower lobes. No focal consolidation, pleural effusion, or pneumothorax. Musculoskeletal: No chest wall abnormality. No acute osseous findings. Review of the MIP images confirms the above findings. CT ABDOMEN and PELVIS FINDINGS Hepatobiliary: No acute abnormality. 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 No follow-up recommended. Unremarkable gallbladder. No biliary dilation. Pancreas: Unremarkable. No pancreatic ductal dilatation or surrounding inflammatory changes. Spleen: Normal in size without focal abnormality.  Adrenals/Urinary Tract: Normal adrenal glands. Left nephrectomy. Unremarkable right kidney. No urinary calculi or hydronephrosis. Foley catheter in nondistended bladder. Stomach/Bowel: Normal caliber large and small bowel. The appendix is not visualized. Vascular/Lymphatic: Advanced aorto bi-iliac calcified atherosclerosis. No aneurysm. No suspicious lymphadenopathy. Reproductive: Enlarged prostate. Other: No free intraperitoneal fluid or air. Musculoskeletal: Advanced thoracolumbar spondylosis with retrolisthesis of  L2 and L3. Review of the MIP images confirms the above findings. IMPRESSION: 1. Negative for acute pulmonary embolism. 2. Advanced coronary artery and Aortic Atherosclerosis (ICD10-I70.0). 3. Bronchial wall thickening and air trapping compatible with chronic bronchitis/reactive airways. 4. No acute abnormality in the abdomen or pelvis. 5. Enlarged prostate. Electronically Signed   By: Placido Sou M.D.   On: 04/30/2022 00:50   CT ABDOMEN PELVIS W CONTRAST  Result Date: 04/30/2022 CLINICAL DATA:  Increased weight gain and shortness of breath Positive D-dimer EXAM: CT ANGIOGRAPHY CHEST CT ABDOMEN AND PELVIS WITH CONTRAST TECHNIQUE: Multidetector CT imaging of the chest was performed using the standard protocol during bolus administration of intravenous contrast. Multiplanar CT image reconstructions and MIPs were obtained to evaluate the vascular anatomy. Multidetector CT imaging of the abdomen and pelvis was performed using the standard protocol during bolus administration of intravenous contrast. RADIATION DOSE REDUCTION: This exam was performed according to the departmental dose-optimization program which includes automated exposure control, adjustment of the mA and/or kV according to patient size and/or use of iterative reconstruction technique. CONTRAST:  162m OMNIPAQUE IOHEXOL 350 MG/ML SOLN COMPARISON:  CT abdomen and pelvis 11/21/2021; CT chest abdomen and pelvis 09/07/2021 FINDINGS: CTA  CHEST FINDINGS Cardiovascular: Satisfactory opacification of the pulmonary arteries to the segmental level. No evidence of pulmonary embolism. Normal heart size. Advanced coronary artery calcification. Trace pericardial effusion. Mediastinum/Nodes: Similar prominent mediastinal adenopathy. Thyroid gland, trachea, and esophagus demonstrate no significant findings. Lungs/Pleura: Expiratory phase imaging. Air trapping bilaterally. Bronchial wall thickening and mucous plugging greatest in the lower lobes. No focal consolidation, pleural effusion, or pneumothorax. Musculoskeletal: No chest wall abnormality. No acute osseous findings. Review of the MIP images confirms the above findings. CT ABDOMEN and PELVIS FINDINGS Hepatobiliary: No acute abnormality. 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 No follow-up recommended. Unremarkable gallbladder. No biliary dilation. Pancreas: Unremarkable. No pancreatic ductal dilatation or surrounding inflammatory changes. Spleen: Normal in size without focal abnormality. Adrenals/Urinary Tract: Normal adrenal glands. Left nephrectomy. Unremarkable right kidney. No urinary calculi or hydronephrosis. Foley catheter in nondistended bladder. Stomach/Bowel: Normal caliber large and small bowel. The appendix is not visualized. Vascular/Lymphatic: Advanced aorto bi-iliac calcified atherosclerosis. No aneurysm. No suspicious lymphadenopathy. Reproductive: Enlarged prostate. Other: No free intraperitoneal fluid or air. 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 Review of the MIP images confirms the above findings. IMPRESSION: 1. Negative for acute pulmonary embolism. 2. Advanced coronary artery and Aortic Atherosclerosis (ICD10-I70.0). 3. Bronchial wall thickening and air trapping compatible with chronic bronchitis/reactive airways. 4. No acute abnormality in the abdomen or pelvis. 5. Enlarged prostate. Electronically Signed   By: TPlacido SouM.D.   On: 04/30/2022 00:50   UKoreaVenous Img Lower Bilateral  Result Date: 04/30/2022 CLINICAL DATA:  Bilateral lower extremity edema EXAM: BILATERAL LOWER EXTREMITY VENOUS DOPPLER ULTRASOUND TECHNIQUE: Gray-scale sonography with compression, as well as color and duplex ultrasound, were performed to evaluate the deep venous system(s) from the level of the common femoral vein through the popliteal and proximal calf veins. COMPARISON:  None Available. FINDINGS: VENOUS Normal compressibility of the common femoral, superficial femoral, and popliteal veins, as well as the visualized calf veins. Visualized portions of profunda femoral vein and great saphenous vein unremarkable. No filling defects to suggest DVT on grayscale or color Doppler imaging. Doppler waveforms show normal direction of venous flow, normal respiratory plasticity and response to augmentation. Limited views of the contralateral common femoral vein are unremarkable. OTHER None. Limitations: none IMPRESSION: Negative. Electronically Signed  By: Ulyses Jarred M.D.   On: 04/30/2022 00:36   US Abdomen Limited  Result Date: 04/30/2022 CLINICAL DATA:  Abdominal distension EXAM: LIMITED ABDOMEN ULTRASOUND FOR ASCITES TECHNIQUE: Limited ultrasound survey for ascites was performed in all four abdominal quadrants. COMPARISON:  None Available. FINDINGS: No ascites visualized. IMPRESSION: No ascites visualized. Electronically Signed   By: Ulyses Jarred M.D.   On: 04/30/2022 00:31   DG Chest 2 View  Result Date: 04/29/2022 CLINICAL DATA:  Shortness of breath EXAM: CHEST - 2 VIEW COMPARISON:  Radiographs 02/11/2022 FINDINGS: Stable cardiomediastinal silhouette. Aortic atherosclerotic calcification. Calcified mediastinal nodes. No focal consolidation, pleural effusion, or pneumothorax. No acute osseous abnormality. Elevated right hemidiaphragm. IMPRESSION: No active cardiopulmonary disease. Electronically Signed   By: Placido Sou M.D.   On:  04/29/2022 17:07    Pending Labs Unresulted Labs (From admission, onward)     Start     Ordered   05/07/22 0500  Creatinine, serum  (enoxaparin (LOVENOX)    CrCl >/= 30 ml/min)  Weekly,   R     Comments: while on enoxaparin therapy    04/30/22 0312   05/01/22 4315  Basic metabolic panel  Tomorrow morning,   R        04/30/22 1315   05/01/22 0500  CBC  Tomorrow morning,   R        04/30/22 1315   04/30/22 0145  Urine Culture  Once,   URGENT       Question:  Indication  Answer:  Bacteriuria screening (OB/GYN or Uro)   04/30/22 0144            Vitals/Pain Today's Vitals   04/30/22 2223 05/01/22 0208 05/01/22 0345 05/01/22 0400  BP: 119/72 119/65  106/72  Pulse: 81 73  70  Resp: '18 18  15  '$ Temp:      TempSrc:      SpO2: 93% 96% 91% 91%  Weight:      Height:      PainSc:        Isolation Precautions No active isolations  Medications Medications  hydrOXYzine (ATARAX) tablet 10 mg (has no administration in time range)  furosemide (LASIX) injection 40 mg (40 mg Intravenous Given 04/30/22 1838)  enoxaparin (LOVENOX) injection 57.5 mg (57.5 mg Subcutaneous Given 04/30/22 1027)  ondansetron (ZOFRAN) tablet 4 mg (has no administration in time range)    Or  ondansetron (ZOFRAN) injection 4 mg (has no administration in time range)  acetaminophen (TYLENOL) tablet 650 mg (650 mg Oral Given 05/01/22 0242)    Or  acetaminophen (TYLENOL) suppository 650 mg ( Rectal See Alternative 05/01/22 0242)  aspirin EC tablet 81 mg (81 mg Oral Given 04/30/22 1027)  atorvastatin (LIPITOR) tablet 40 mg (40 mg Oral Given 04/30/22 1028)  losartan (COZAAR) tablet 25 mg (25 mg Oral Given 04/30/22 1027)  spironolactone (ALDACTONE) tablet 25 mg (25 mg Oral Given 04/30/22 1028)  donepezil (ARICEPT) tablet 10 mg (10 mg Oral Given 04/30/22 2226)  traZODone (DESYREL) tablet 50 mg (50 mg Oral Given 04/30/22 2226)  pantoprazole (PROTONIX) EC tablet 40 mg (40 mg Oral Given 04/30/22 1028)  senna-docusate  (Senokot-S) tablet 1 tablet (has no administration in time range)  finasteride (PROSCAR) tablet 5 mg (5 mg Oral Given 04/30/22 1031)  tamsulosin (FLOMAX) capsule 0.4 mg (0.4 mg Oral Given 04/30/22 1027)  cyanocobalamin (VITAMIN B12) tablet 1,000 mcg (1,000 mcg Oral Not Given 04/30/22 1327)  diclofenac Sodium (VOLTAREN) 1 % topical gel 2 g (has no  administration in time range)  lidocaine (LIDODERM) 5 % 2 patch (has no administration in time range)  metoprolol succinate (TOPROL-XL) 24 hr tablet 12.5 mg (12.5 mg Oral Given 04/30/22 1030)  furosemide (LASIX) injection 40 mg (40 mg Intravenous Given 04/29/22 2235)  iohexol (OMNIPAQUE) 350 MG/ML injection 100 mL (100 mLs Intravenous Contrast Given 04/30/22 0023)  acetaminophen (TYLENOL) tablet 1,000 mg (1,000 mg Oral Given 04/30/22 0229)    Mobility walks with person assist - has been unsteady with previous staff. Usually walks with walker. High Fall Risk   Focused Assessments Pulmonary Assessment Handoff:  Lung sounds:   O2 Device: Room Air      R Recommendations: See Admitting Provider Note  Report given to:   Additional Notes: - on hospital bed Chronic Foley

## 2022-05-02 DIAGNOSIS — R601 Generalized edema: Secondary | ICD-10-CM | POA: Diagnosis not present

## 2022-05-02 DIAGNOSIS — I509 Heart failure, unspecified: Secondary | ICD-10-CM

## 2022-05-02 DIAGNOSIS — I441 Atrioventricular block, second degree: Secondary | ICD-10-CM

## 2022-05-02 LAB — BASIC METABOLIC PANEL
Anion gap: 10 (ref 5–15)
BUN: 25 mg/dL — ABNORMAL HIGH (ref 8–23)
CO2: 25 mmol/L (ref 22–32)
Calcium: 8.8 mg/dL — ABNORMAL LOW (ref 8.9–10.3)
Chloride: 102 mmol/L (ref 98–111)
Creatinine, Ser: 1.33 mg/dL — ABNORMAL HIGH (ref 0.61–1.24)
GFR, Estimated: 54 mL/min — ABNORMAL LOW (ref >60)
Glucose, Bld: 95 mg/dL (ref 70–99)
Potassium: 3.3 mmol/L — ABNORMAL LOW (ref 3.5–5.1)
Sodium: 137 mmol/L (ref 135–145)

## 2022-05-02 LAB — TSH: TSH: 2.382 u[IU]/mL (ref 0.350–4.500)

## 2022-05-02 MED ORDER — POTASSIUM CHLORIDE CRYS ER 20 MEQ PO TBCR
40.0000 meq | EXTENDED_RELEASE_TABLET | Freq: Once | ORAL | Status: AC
  Administered 2022-05-02: 40 meq via ORAL
  Filled 2022-05-02: qty 2

## 2022-05-02 MED ORDER — FUROSEMIDE 20 MG PO TABS
20.0000 mg | ORAL_TABLET | Freq: Two times a day (BID) | ORAL | Status: DC
  Administered 2022-05-02 – 2022-05-03 (×3): 20 mg via ORAL
  Filled 2022-05-02 (×3): qty 1

## 2022-05-02 MED ORDER — HYDROCORTISONE 1 % EX CREA
TOPICAL_CREAM | CUTANEOUS | Status: DC | PRN
  Filled 2022-05-02: qty 28

## 2022-05-02 NOTE — Evaluation (Signed)
Physical Therapy Evaluation Patient Details Name: Adam Rios. MRN: [id number] DOB: May 16, 1941 Today's Date: 05/02/2022  History of Present Illness  Adam Rios is an 44yoM who comes to [facility] on 11/29 c SOB. PMH: HTN, renal cell carcinoma s/p nephrectomy, CVA, neurogenic bladder s/p chronic foley, post surgical LE paralysis. Pt reports a recent weight gain and ABD distended. Pt admitted with anasarca.  Clinical Impression  Pt already up to chair, reports LEE has improved but not back to normal. He maintains global weakness but has good movement of limbs against gravity. Pt is generally bradykinetic and flat affect despite anxiety in session about falling. He does endorse being acutely limited in strength and mobility to a significant degree, however he frequently blames this on not having his baseline style walker showing some limited insight into how much of his impairment is internal rather than external. Pt up in chair at EOS, all needs met. Sounds like his needs can be met at home in current state, as he typically is unable to AMB much more than household distances at baseline. Post AMB bradycardia at 51bpm, will make RN aware.      Recommendations for follow up therapy are one component of a multi-disciplinary discharge planning process, led by the attending physician.  Recommendations may be updated based on patient status, additional functional criteria and insurance authorization.  Follow Up Recommendations Home health PT (continue with HHPT services as he has been up until admission)      Assistance Recommended at Discharge Intermittent Supervision/Assistance  Patient can return home with the following  A lot of help with walking and/or transfers;A lot of help with bathing/dressing/bathroom;Assistance with cooking/housework;Assist for transportation;Help with stairs or ramp for entrance;Direct supervision/assist for financial management    Equipment Recommendations None recommended  by PT  Recommendations for Other Services       Functional Status Assessment Patient has had a recent decline in their functional status and demonstrates the ability to make significant improvements in function in a reasonable and predictable amount of time.     Precautions / Restrictions Precautions Precautions: Fall Precaution Comments: watch HR Restrictions Weight Bearing Restrictions: No      Mobility  Bed Mobility               General bed mobility comments: already up to chair, returned to chair at end of session.    Transfers Overall transfer level: Needs assistance Equipment used: Rolling walker (2 wheels) Transfers: Sit to/from Stand Sit to Stand: Min assist           General transfer comment: typically uses grab bars for pull-oriented transfers, author stages bed rail for use of pulling for transfers.    Ambulation/Gait Ambulation/Gait assistance: Min guard Gait Distance (Feet): 45 Feet Assistive device: Rolling walker (2 wheels)   Gait velocity: <0.53ms     General Gait Details: his distaste for RW and mistrust is central theme in overground AMB, he avoiding 2 hands on RW anytime a fixed object is within reach. ASked multiple times to keep both hands on RW, but he remains anxious about falling and thus impulsive. (post AMB HR 51bpm)  Stairs            Wheelchair Mobility    Modified Rankin (Stroke Patients Only)       Balance  Pertinent Vitals/Pain Pain Assessment Pain Assessment: 0-10 Pain Score: 6  Pain Location: swollen legs Pain Descriptors / Indicators: Aching Pain Intervention(s): Limited activity within patient's tolerance, Monitored during session, Premedicated before session, Repositioned    Home Living Family/patient expects to be discharged to:: Private residence Living Arrangements:  (DTR, SIL, 2 grandkids (SIL works nights)) Available Help at Discharge:  Available PRN/intermittently;Family Type of Home: House Home Access: Stairs to enter   [employer] of Steps: 1 partial step out the patio holds onto door frame   Home Layout: Two level;Able to live on main level with bedroom/bathroom Home Equipment: Air cabin crew (4 wheels);Wheelchair - manual;Grab bars - toilet;Grab bars - tub/shower (leg rests) Additional Comments: lives with daughter, son in law and grand children    Prior Function Prior Level of Function : Needs assist             Mobility Comments: Pt reports he sleeps in a lift chair but is able to ambulate to/from bathroom without assistance, uses doorframe to negotiate single step into house. ADLs Comments: assist for bathing/LBD     Hand Dominance        Extremity/Trunk Assessment   Upper Extremity Assessment Upper Extremity Assessment: Overall WFL for tasks assessed    Lower Extremity Assessment Lower Extremity Assessment: Generalized weakness       Communication   Communication: HOH  Cognition Arousal/Alertness: Awake/alert Behavior During Therapy: WFL for tasks assessed/performed, Anxious, Flat affect, Impulsive Overall Cognitive Status: Within Functional Limits for tasks assessed                                          General Comments General comments (skin integrity, edema, etc.): HR 40-55 during mobility, resolved to 70s with rest    Exercises General Exercises - Lower Extremity Ankle Circles/Pumps: AROM, Both, 5 reps, Supine Long Arc Quad: AROM, Both, 5 reps, Supine   Assessment/Plan    PT Assessment Patient needs continued PT services  PT Problem List Decreased activity tolerance;Decreased balance;Decreased mobility;Decreased strength;Decreased safety awareness;Decreased knowledge of precautions       PT Treatment Interventions DME instruction;Gait training;Neuromuscular re-education;Stair training;Functional mobility training;Therapeutic  activities;Therapeutic exercise;Balance training;Patient/family education    PT Goals (Current goals can be found in the Care Plan section)  Acute Rehab PT Goals Patient Stated Goal: go home today PT Goal Formulation: With patient Time For Goal Achievement: 05/16/22 Potential to Achieve Goals: Fair    Frequency Min 2X/week     Co-evaluation               AM-PAC PT "6 Clicks" Mobility  Outcome Measure Help needed turning from your back to your side while in a flat bed without using bedrails?: A Lot Help needed moving from lying on your back to sitting on the side of a flat bed without using bedrails?: A Lot Help needed moving to and from a bed to a chair (including a wheelchair)?: A Lot Help needed standing up from a chair using your arms (e.g., wheelchair or bedside chair)?: A Lot Help needed to walk in hospital room?: A Little Help needed climbing 3-5 steps with a railing? : A Lot 6 Click Score: 13    End of Session   Activity Tolerance: Patient tolerated treatment well;No increased pain Patient left: in chair;with call bell/phone within reach;with chair alarm set Nurse Communication: Mobility status PT Visit Diagnosis: Difficulty in walking, not  elsewhere classified (R26.2);Unsteadiness on feet (R26.81);Other abnormalities of gait and mobility (R26.89);Muscle weakness (generalized) (M62.81)    Time: 9163-8466 PT Time Calculation (min) (ACUTE ONLY): 34 min   Charges:   PT Evaluation $PT Eval Moderate Complexity: 1 Mod         11:45 AM, 05/02/22 Etta Grandchild, PT, DPT Physical Therapist - [facility]  [phone] ([place])    Cressida Milford C 05/02/2022, 11:42 AM

## 2022-05-02 NOTE — TOC Initial Note (Addendum)
Transition of Care (TOC) - Initial/Assessment Note    Patient Details  Name: Adam Rios. MRN: [id number] Date of Birth: 05-23-1941  Transition of Care [facility]) CM/SW Contact:    Magnus Ivan, LCSW Phone Number: 05/02/2022, 11:05 AM  Clinical Narrative:                 CSW spoke with patient by phone. Patient lives with his daughter, son in law, and their children.  PCP is Dr. Benard Rink. Patient states his family provides transport. Patient has a rollator, wheelchair, shower chair, and bedside commode at home.  Patient states he is active with [facility] and would like to continue with their services at [place], [place] reached out to [place] with [facility] who states she is going to confirm this and call CSW back. Patient states he will need EMS transport home. Confirmed home address.   11:30- Alwyn Ren stated they are not active, she is checking with their Admin on if they can accept patient back.   11:45- Alwyn Ren confirmed that [facility] can accept patient.   Expected Discharge Plan: [place] Barriers to Discharge: Continued Medical Work up   Patient Goals and CMS Choice Patient states their goals for this hospitalization and ongoing recovery are:: home with home health CMS Medicare.gov Compare Post Acute Care list provided to:: Patient Choice offered to / list presented to : Patient  Expected Discharge Plan and Services Expected Discharge Plan: [place]       Living arrangements for the past 2 months: [place] Agency: [place] Date [place]: 05/02/22   Representative spoke with at [place]: Alwyn Ren  Prior Living Arrangements/Services Living arrangements for the past 2 months: [place] Lives with:: Adult Children, Relatives Patient language and need for interpreter reviewed:: Yes Do you feel safe going back to the place where you live?: Yes       Need for Family Participation in Patient Care: Yes (Comment) Care giver support system in place?: Yes (comment) Current home services: DME Criminal Activity/Legal Involvement Pertinent to Current Situation/Hospitalization: No - Comment as needed  Activities of Daily Living Home Assistive Devices/Equipment: Wheelchair, Oxygen ADL Screening (condition at time of admission) Patient's cognitive ability adequate to safely complete daily activities?: Yes Is the patient deaf or have difficulty hearing?: No Does the patient have difficulty seeing, even when wearing glasses/contacts?: No Does the patient have difficulty concentrating, remembering, or making decisions?: No Patient able to express need for assistance with ADLs?: Yes Does the patient have difficulty dressing or bathing?: No Independently performs ADLs?: No Communication: Independent Dressing (OT): Needs assistance Is this a change from baseline?: Pre-admission baseline Grooming: Independent Feeding: Independent Bathing: Needs assistance Is this a change from baseline?: Pre-admission baseline Toileting: Needs assistance Is this a change from baseline?: Pre-admission baseline In/Out Bed: Needs assistance Is this a change from baseline?: Pre-admission baseline Walks in Home: Needs assistance Is this a change from baseline?: Pre-admission baseline Does the patient have difficulty walking or climbing stairs?: Yes Weakness of Legs: Both Weakness of Arms/Hands: None  Permission Sought/Granted Permission sought to share information with : Facility Art therapist granted to share information with : Yes, Verbal Permission Granted     Permission granted to share info w AGENCY:  HH, DME agencies, [facility]EM[facility]        Emotional Assessment       Orientation: : Oriented to Self, Oriented to Place, Oriented to  Time, Oriented to Situation Alcohol / Substance Use: Not Applicable Psych Involvement: No  (comment)  Admission diagnosis:  Anasarca [R60.1] CHF (congestive heart failure) (HCC) [I50.9] Peripheral edema [R60.9] Neurogenic bladder [N31.9] Chronic indwelling Foley catheter [Z97.8] Dyspnea, unspecified type [R06.00] Acute on chronic congestive heart failure, unspecified heart failure type ([place]) [I50.9] Patient Active Problem List   Diagnosis Date Noted   Acute on chronic diastolic CHF (congestive heart failure) ([place]) 04/30/2022   Obesity (BMI 30-39.9) 04/30/2022   Chronic indwelling Foley catheter 04/30/2022   Septic shock ([place]) 02/11/2022   Pressure injury of skin 02/11/2022   Bacteremia 11/22/2021   Hematuria 11/21/2021   Renal cell carcinoma s/p nephrectomy, left 11/21/2021   History of recurrent UTI and ESBL UTI  11/21/2021   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   UTI (urinary tract infection) 09/09/2021   Neuropathy 09/09/2021   History of CVA (cerebrovascular accident) 09/09/2021   Fall 05/14/2021   Frailty 03/04/2018   Acute encephalopathy 01/30/2018   Chronic kidney disease 04/05/2017   GERD (gastroesophageal reflux disease) 04/05/2017   Pain 04/05/2017   Weakness 04/05/2017   Recurrent UTI 01/20/2016   Essential hypertension 07/31/2014   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 04/10/2014   Ventral hernia without obstruction or gangrene 04/10/2014   Acute low back pain without sciatica 12/26/2013   Fusion of spine of lumbar region 12/26/2013   Anasarca 09/26/2013   Hemorrhoid 09/26/2013   Neurogenic bladder 09/26/2013   Renal cell cancer ([place]) 09/26/2013   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   Lower urinary tract infectious disease 10/28/2012   PCP:  Dola Argyle, MD Pharmacy:   [facility] [phone] - [address], [place] AT [facility] OF [address] [place] [address] Phone: [phone] Fax: [phone]     Social Determinants of Health (SDOH) Interventions    Readmission Risk  Interventions    02/12/2022    2:17 PM  Readmission Risk Prevention Plan  Transportation Screening Complete  PCP or Specialist Appt within 3-5 Days Complete  HRI or Home Care Consult Complete  Social Work Consult for [place] Planning/Counseling Complete  Palliative Care Screening Not Applicable  Medication Review Press photographer) Referral to Pharmacy

## 2022-05-02 NOTE — Progress Notes (Signed)
PROGRESS NOTE  Adam Rios.  IRJ:[id number] DOB: [birth year] DOA: 04/29/2022 PCP: Dola Argyle, MD   Brief Narrative: Patient is a 81 year old male with history of hypertension, morbid obesity, renal cell carcinoma status post nephrectomy, neurogenic bladder with chronic Foley catheter, lower extremity weakness ambulates with the help of rollator presents here with complaint of shortness of breath, lower extremity edema, weight gain.  On presentation, he was found to be severely volume overloaded with bilateral lower extremity edema. Started on IV Lasix.  During his hospitalization, he was noticed to have 2nd degree AV block,cardiology consulted.  Planning to send him home with heart monitor but could not do on the weekend so planning for Monday.    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 Started on IV Lasix.  Echo showed EF of 50 to 30%, grade 1 diastolic dysfunction. Significant improvement in the anasarca.  We will discontinue IV Lasix and start on home dose 20 mg twice daily.  Continue spironolactone and losartan.  2nd  degree AV block: Patient remains bradycardic with heart rate in the range of 40-50.  Asymptomatic.  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  Patient is asymptomatic.  Cardiology recommending heart monitor on discharge, which will likely happen on Monday.  Donepezil discontinued.  TSH pending  Hypertension: Currently  blood pressure stable.  Continue spironolactone, losartan  History of dementia: Currently alert oriented.  Donepezil at home, now discontinued  Constipation: Continue bowel regimen  Hyperlipidemia: On Lipitor  History of BPH/lower urinary tract symptoms/obstruction: Has chronic Foley catheter.  Follows with urology.  Continue Proscar, Myrbetriq, tamsulosin  History of renal cell carcinoma: Status post nephrectomy left side.  Currently kidney function stable.  Obesity: BMI 32.8  Weakness/deconditioning:PT/OT recommend home health on discharge        DVT prophylaxis:Lovenox     Code Status: Full Code  Family Communication: Called and discussed with daughter on phone on 12/2  Patient status:Inpatient  Patient is from :Home  Anticipated discharge ZS:WFUX  Estimated DC date: Monday  Consultants: Cardiology  Procedures:None  Antimicrobials:  Anti-infectives (From admission, onward)    None       Subjective: Patient seen and examined at bedside today.  Hemodynamically stable.  Sitting in the chair.  Comfortable.  Significant improvement in the lower extremity edema.  No new complaints .Eager to go home  Objective: Vitals:   05/02/22 0927 05/02/22 1128 05/02/22 1246 05/02/22 1311  BP:   (!) 111/57   Pulse: (!) 55 (!) 51 (!) 43 (!) 46  Resp:   20   Temp:   (!) 97.5 F (36.4 C)   TempSrc:      SpO2:   98%   Weight:      Height:        Intake/Output Summary (Last 24 hours) at 05/02/2022 1451 Last data filed at 05/02/2022 0018 Gross per 24 hour  Intake --  Output 1400 ml  Net -1400 ml   [employer]  04/30/22 0548 04/30/22 1825  Weight: 114.1 kg 112.9 kg    Examination:  General exam: Overall comfortable, not in distress,obese, deconditioned,  HEENT: PERRL Respiratory system:  no wheezes or crackles  Cardiovascular system: S1 & S2 heard, RRR.  Gastrointestinal system: Abdomen is nondistended, soft and nontender. Central nervous system: Alert and  oriented Extremities: LE edema, chronic venous stasis changes, no clubbing ,no cyanosis Skin: No rashes, no ulcers,no icterus   GU: Foley catheter   Data Reviewed: I have personally reviewed following labs and imaging studies  CBC: Recent Labs  Lab 04/29/22 1632 05/01/22 0738  WBC 8.6 6.8  HGB 12.5* 12.1*  HCT 41.1 36.8*  MCV 86.3 82.0  PLT 216 989   Basic Metabolic Panel: Recent Labs  Lab 04/29/22 1632 04/30/22 1453 05/01/22 0738 05/02/22 0356  NA 136 137 138 137  K 3.9 3.7 3.5 3.3*  CL 102 102 103 102  CO2 '25 25 27 25  '$ GLUCOSE 96 114* 100* 95  BUN 21 22 24* 25*  CREATININE 1.11 1.47* 1.22 1.33*  CALCIUM 9.1 8.7* 8.6* 8.8*     Recent Results (from the past 240 hour(s))  Urine Culture     Status: Abnormal   Collection Time: 04/30/22  1:47 AM   Specimen: Urine, Random  Result Value Ref Range Status   Specimen Description   Final    URINE, RANDOM Performed at [facility], [address], [place], [place] [address]    Special Requests   Final    NONE Performed at [facility], [address] [place], [place] [address]    Culture MULTIPLE SPECIES PRESENT, SUGGEST RECOLLECTION (A)  Final   Report Status 05/01/2022 FINAL  Final  MRSA Next Gen by PCR, Nasal     Status: Abnormal   Collection Time: 05/01/22 11:36 AM   Specimen: Nasal Mucosa; Nasal Swab  Result Value Ref Range Status   MRSA by PCR Next Gen DETECTED (A) NOT DETECTED Final    Comment: RESULT CALLED TO, READ BACK BY AND VERIFIED WITH: SAINABOU TAMBAJANG AT 1411 05/01/22.PMF (NOTE) The GeneXpert MRSA Assay (FDA approved for NASAL specimens only), is one component of a comprehensive MRSA colonization surveillance program. It is not intended to diagnose MRSA infection nor to guide or monitor treatment for MRSA infections. Test performance is not FDA approved in patients less than 73 years old. Performed at [facility], [address] [place], [place] [address]       Radiology Studies: No results found.  Scheduled Meds:  aspirin EC  81 mg Oral Daily   atorvastatin  40 mg Oral Daily   Chlorhexidine Gluconate Cloth  6 each Topical Daily   Chlorhexidine Gluconate Cloth  6 each Topical Q0600   cyanocobalamin  1,000 mcg Oral Daily   enoxaparin (LOVENOX) injection  0.5 mg/kg Subcutaneous Q24H   finasteride  5 mg Oral Daily   losartan  25 mg Oral Daily   mupirocin ointment  1 Application Nasal BID   pantoprazole  40 mg Oral Daily   polyethylene glycol  17 g Oral Daily   senna-docusate  1 tablet Oral BID   spironolactone  25 mg Oral Daily   tamsulosin  0.4 mg Oral Daily   Continuous Infusions:   LOS: 2 days   Shelly Coss, MD [facility] P12/07/2021, 2:51 PM

## 2022-05-02 NOTE — Evaluation (Signed)
Occupational Therapy Evaluation Patient Details Name: Adam Rios. MRN: [id number] DOB: Jun 30, 1940 Today's Date: 05/02/2022   History of Present Illness Patient is a 81 year old male with history of hypertension, morbid obesity, renal cell carcinoma status post nephrectomy, neurogenic bladder with chronic Foley catheter, lower extremity weakness ambulates with the help of rollator presents here with complaint of shortness of breath, lower extremity edema, weight gain.  On presentation, he was found to be severely volume overloaded with bilateral lower extremity edema.   Clinical Impression   Adam Rios was seen for OT evaluation this date. Prior to hospital admission, pt was MOD I for limited household mobility using 4WW. Pt lives with daughter, son in law, and grand children. Pt presents to acute OT demonstrating impaired ADL performance and functional mobility 2/2 decreased activity tolerance and functional strength/ROM/balance deficits. Pt currently requires MAX A don B socks seated EOB (baseline). Requires MIN A sit<>stand from elevated bed height, cues for steps bed>chair ~5 ft. MIN A from low chair - assist to stabilize RW, reports use of lift chair and elevated toilet with grab bars at home. Pt appears near baseline level of function, recommend HHOT upon hospital discharge with initial 24/7 SUPERVISION. If unable to have physical assistance then pt would benefit from STR.    Recommendations for follow up therapy are one component of a multi-disciplinary discharge planning process, led by the attending physician.  Recommendations may be updated based on patient status, additional functional criteria and insurance authorization.   Follow Up Recommendations  Home health OT     Assistance Recommended at Discharge Frequent or constant Supervision/Assistance  Patient can return home with the following A lot of help with walking and/or transfers;A lot of help with bathing/dressing/bathroom;Help  with stairs or ramp for entrance    Functional Status Assessment  Patient has had a recent decline in their functional status and demonstrates the ability to make significant improvements in function in a reasonable and predictable amount of time.  Equipment Recommendations  None recommended by OT (has all equipment)    Recommendations for Other Services       Precautions / Restrictions Precautions Precautions: Fall Precaution Comments: watch HR Restrictions Weight Bearing Restrictions: No      Mobility Bed Mobility Overal bed mobility: Needs Assistance Bed Mobility: Supine to Sit     Supine to sit: Min assist          Transfers Overall transfer level: Needs assistance Equipment used: Rolling walker (2 wheels) Transfers: Sit to/from Stand, Bed to chair/wheelchair/BSC Sit to Stand: Min assist, From elevated surface     Step pivot transfers: Min assist     General transfer comment: assist to stabilize RW and lift off from low height      Balance Overall balance assessment: Needs assistance Sitting-balance support: No upper extremity supported, Feet supported Sitting balance-Leahy Scale: Good     Standing balance support: Bilateral upper extremity supported, Reliant on assistive device for balance Standing balance-Leahy Scale: Fair                             ADL either performed or assessed with clinical judgement   ADL Overall ADL's : Needs assistance/impaired                                       General ADL Comments: MAX A don  B socks seated EOB. Requires BUE support static standing, unable to tolerate standing grooming, SETUP seated grooming tasks      Pertinent Vitals/Pain Pain Assessment Pain Assessment: No/denies pain     Hand Dominance     Extremity/Trunk Assessment Upper Extremity Assessment Upper Extremity Assessment: Overall WFL for tasks assessed   Lower Extremity Assessment Lower Extremity Assessment:  Generalized weakness       Communication Communication Communication: HOH   Cognition Arousal/Alertness: Awake/alert Behavior During Therapy: Flat affect, WFL for tasks assessed/performed Overall Cognitive Status: Impaired/Different from baseline Area of Impairment: Problem solving, Following commands                       Following Commands: Follows multi-step commands with increased time     Problem Solving: Slow processing, Requires verbal cues       General Comments  HR 40-55 during mobility, resolved to 70s with rest            Home Living Family/patient expects to be discharged to:: Private residence Living Arrangements: Children Available Help at Discharge: Available PRN/intermittently;Family Type of Home: House Home Access: Stairs to enter [employer] of Steps: 1   Home Layout: Two level;Able to live on main level with bedroom/bathroom               Home Equipment: Air cabin crew (4 wheels);Wheelchair - manual;Grab bars - toilet;Grab bars - tub/shower   Additional Comments: lives with daughter, son in Sports coach and grand children      Prior Functioning/Environment Prior Level of Function : Needs assist             Mobility Comments: Pt reports he sleeps in a lift chair but is able to ambulate to/from bathroom without assistance, uses doorframe to negotiate single step into house. ADLs Comments: assist for bathing/LBD        OT Problem List: Decreased strength;Decreased range of motion;Decreased activity tolerance;Impaired balance (sitting and/or standing);Decreased safety awareness      OT Treatment/Interventions: Self-care/ADL training;Therapeutic exercise;Energy conservation;DME and/or AE instruction;Therapeutic activities;Patient/family education;Balance training    OT Goals(Current goals can be found in the care plan section) Acute Rehab OT Goals Patient Stated Goal: to go home OT Goal Formulation: With patient Time  For Goal Achievement: 05/16/22 Potential to Achieve Goals: Good  OT Frequency: Min 3X/week    Co-evaluation              AM-PAC OT "6 Clicks" Daily Activity     Outcome Measure Help from another person eating meals?: None Help from another person taking care of personal grooming?: A Little Help from another person toileting, which includes using toliet, bedpan, or urinal?: A Lot Help from another person bathing (including washing, rinsing, drying)?: A Lot Help from another person to put on and taking off regular upper body clothing?: A Little Help from another person to put on and taking off regular lower body clothing?: A Lot 6 Click Score: 16   End of Session Equipment Utilized During Treatment: Rolling walker (2 wheels) Nurse Communication: Mobility status  Activity Tolerance: Patient tolerated treatment well Patient left: with call bell/phone within reach;in chair;with chair alarm set;with nursing/sitter in room  OT Visit Diagnosis: Other abnormalities of gait and mobility (R26.89);Muscle weakness (generalized) (M62.81)                Time: 6629-4765 OT Time Calculation (min): 27 min Charges:  OT General Charges $OT Visit: 1 Visit OT Evaluation $OT Eval Low Complexity:  1 Low OT Treatments $Self Care/Home Management : 8-22 mins  Dessie Coma, M.S. OTR/L  05/02/22, 11:10 AM  ascom [phone]

## 2022-05-02 NOTE — Progress Notes (Signed)
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  MD made aware at that time.  Cards input requested by hospitalist.  At approx 1430, this RN and an additional RN ambulated the pt in the room with a RW.  Pt's HR started mid 40's, 02 SAT 95% on RA.  RN's did the majority of the work ambulating patient.  Patient unsteady, poor posture, and 02 SAT dropped to 83% on RA with activity.  HR was in the mid 50's.  Pt returned back to the chair, 02 SAT recovered to mid 90's and HR remained 40-50's.  Dr. Tamsen Meek and cardiologist made aware of the events.  Decision made to not discharge patient and continue to monitor.  This RN discussed new discharge plan with the patient who is reluctantly in agreement.  This RN attempted to contact pt's daughter [name], but the line was busy.  Pt currently resting in the chair, all needs met at this time.  WCTM

## 2022-05-02 NOTE — Consult Note (Signed)
Cardiology Consultation   Patient ID: Trae Bovenzi. MRN: [id number]; DOB: October 31, 1940  Admit date: 04/29/2022 Date of Consult: 05/02/2022  PCP:  Dola Argyle, [place] Providers Cardiologist:  None        Patient Profile:   Barlow Harrison. is a 81 y.o. male with a hx of hypertension, renal cell carcinoma status post nephrectomy, morbid obesity, neurogenic bladder with chronic indwelling Foley catheter, who is being seen 05/02/2022 for the evaluation of bradycardia at the request of Dr Tawanna Solo.  History of Present Illness:   Mr. Bruun was admitted 04/2022 acute on chronic diastolic heart failure.  He is diuresed with IV Lasix and plans were made for him to be discharged home today.  However he was noted to be bradycardic.  EKG revealed bradycardia with second-degree heart block and 2:1 AV conduction. Mr. Flegel, reports experiencing a low heart rate without any palpitations or skipped beats. He is uncertain if the sensations he experiences at home could be considered palpitations. The patient denies any episodes of dizziness or lightheadedness. Mr. Bos has limited mobility and relies on a rollator for walking assistance. He has not had any recent falls or passing out incidents.  Mr. Otten currently resides with his daughter and her husband. In terms of family medical history, the patient has a sister who recently passed away due to a cerebral hemorrhage. However, there is no known family history of heart disease.  Mr. Calzadilla last saw Dr. Charletta Cousin at [facility] 09/2021.  He was evaluated for LE edema.  EKG at that time showed sinus rhythm with LAD and LBBB.  LE Dopplers were negative for DVT.  Echo revealed LVEF >55% and normal diastolic function.   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[place])    Hypertension    Neurogenic bladder     Renal cell carcinoma ([place]) 2005   Renal disorder    Stroke ([place])     01/17/18, 12/21   Some weakness and cognitive decline    Past Surgical History:  Procedure Laterality Date   APPENDECTOMY     CATARACT EXTRACTION W/PHACO Right 07/22/2020   Procedure: CATARACT EXTRACTION PHACO AND INTRAOCULAR LENS PLACEMENT ([place]) RIGHT;  Surgeon: Eulogio Bear, MD;  Location: [place];  Service: Ophthalmology;  Laterality: Right;  4.77 0:44.2   CATARACT EXTRACTION W/PHACO Left 08/05/2020   Procedure: CATARACT EXTRACTION PHACO AND INTRAOCULAR LENS PLACEMENT ([facility]) LEFT 10.73 01:13.8;  Surgeon: Eulogio Bear, MD;  Location: [place];  Service: Ophthalmology;  Laterality: Left;  Use eye stretcher not chair   HERNIA REPAIR     LUMBAR FUSION     NEPHRECTOMY Left 2005     Home Medications:  Prior to Admission medications   Medication Sig Start Date End Date Taking? Authorizing Provider  acetaminophen (TYLENOL) 325 MG tablet Take 500 mg by mouth every 6 (six) hours as needed. 08/14/14  Yes [provider]  aspirin 81 MG EC tablet Take 81 mg by mouth daily as needed.   Yes [provider]  atorvastatin (LIPITOR) 40 MG tablet Take 40 mg by mouth daily.   Yes [provider]  diclofenac Sodium (VOLTAREN) 1 % GEL Apply 2 g topically 2 (two) times daily as needed. 05/11/21  Yes [provider]  donepezil (ARICEPT) 10 MG tablet Take 10 mg by  mouth at bedtime.   Yes [provider]  finasteride (PROSCAR) 5 MG tablet Take 5 mg by mouth daily. 04/27/22  Yes [provider]  furosemide (LASIX) 20 MG tablet Take 20 mg by mouth 2 (two) times daily.   Yes [provider]  gabapentin (NEURONTIN) 300 MG capsule Take 1 capsule (300 mg total) by mouth 2 (two) times daily. Patient taking differently: Take 300-600 mg by mouth as directed. 2 in the am, 4 after lunch (May split up lunch dose [1 after lunch and 3 at bedtime]). 11/28/21  Yes  Sreenath, Sudheer B, MD  hydrocortisone cream ([employer] CORTISONE MAXIMUM STRENGTH) 1 % Apply 1 Application topically as needed for itching.   Yes [provider]  lidocaine (LIDODERM) 5 % Place 2 patches onto the skin daily as needed. 09/04/21  Yes [provider]  losartan (COZAAR) 25 MG tablet Hold until followup with your outpatient provider because your blood pressure was low in the hospital. Patient taking differently: Take 25 mg by mouth daily. 02/14/22  Yes Enzo Bi, MD  mirabegron ER (MYRBETRIQ) 25 MG TB24 tablet Take 25 mg by mouth daily.   Yes [provider]  omeprazole (PRILOSEC) 20 MG capsule Take 1 capsule (20 mg total) by mouth daily. 05/21/21  Yes Jennye Boroughs, MD  senna-docusate (SENOKOT-S) 8.6-50 MG tablet Take 1 tablet by mouth 2 (two) times daily. Patient taking differently: Take 1 tablet by mouth 2 (two) times daily as needed. 09/10/21  Yes Dwyane Dee, MD  spironolactone (ALDACTONE) 25 MG tablet Hold until followup with your outpatient provider because your blood pressure was low in the hospital. Patient taking differently: Take 25 mg by mouth daily. 02/14/22  Yes Enzo Bi, MD  tamsulosin (FLOMAX) 0.4 MG CAPS capsule Take 0.4 mg by mouth daily.   Yes [provider]  traZODone (DESYREL) 50 MG tablet Take 50-100 mg by mouth at bedtime. 08/12/21  Yes [provider]  vitamin B-12 (CYANOCOBALAMIN) 1000 MCG tablet Take 1,000 mcg by mouth daily.   Yes [provider]    Inpatient Medications: Scheduled Meds:  aspirin EC  81 mg Oral Daily   atorvastatin  40 mg Oral Daily   Chlorhexidine Gluconate Cloth  6 each Topical Daily   Chlorhexidine Gluconate Cloth  6 each Topical Q0600   cyanocobalamin  1,000 mcg Oral Daily   donepezil  10 mg Oral QHS   enoxaparin (LOVENOX) injection  0.5 mg/kg Subcutaneous Q24H   finasteride  5 mg Oral Daily   losartan  25 mg Oral Daily   mupirocin ointment  1 Application Nasal BID   pantoprazole   40 mg Oral Daily   polyethylene glycol  17 g Oral Daily   senna-docusate  1 tablet Oral BID   spironolactone  25 mg Oral Daily   tamsulosin  0.4 mg Oral Daily   Continuous Infusions:  PRN Meds: acetaminophen **OR** acetaminophen, diclofenac Sodium, lidocaine, ondansetron **OR** ondansetron (ZOFRAN) IV, senna-docusate, traZODone  Allergies:    Allergies  Allergen Reactions   Fentanyl Nausea Only   Oxycodone Nausea And Vomiting    Social History:   Social History   Socioeconomic History   Marital status: Divorced    Spouse name: Not on file   Number of children: Not on file   Years of education: Not on file   Highest education level: Not on file  Occupational History   Not on file  Tobacco Use   Smoking status: Former    Packs/day: 0.25  Years: 10.00    Total pack years: 2.50    Types: Cigarettes    Quit date: 2005    Years since quitting: 18.9   Smokeless tobacco: Never  Vaping Use   Vaping Use: Never used  Substance and Sexual Activity   Alcohol use: Not Currently   Drug use: Never   Sexual activity: Not on file  Other Topics Concern   Not on file  Social History Narrative   Not on file   Social Determinants of Health   Financial Resource Strain: Not on file  Food Insecurity: No Food Insecurity (04/30/2022)   Hunger Vital Sign    Worried About Running Out of Food in the Last Year: Never true    Ran Out of Food in the Last Year: Never true  Transportation Needs: No Transportation Needs (04/30/2022)   PRAPARE - Hydrologist (Medical): No    Lack of Transportation (Non-Medical): No  Physical Activity: Not on file  Stress: Not on file  Social Connections: Not on file  Intimate Partner Violence: Not At Risk (04/30/2022)   Humiliation, Afraid, Rape, and Kick questionnaire    Fear of Current or Ex-Partner: No    Emotionally Abused: No    Physically Abused: No    Sexually Abused: No    Family History:    Family History   Problem Relation Age of Onset   Cerebral aneurysm Father    Cerebral aneurysm Sister      ROS:  Please see the history of present illness.  All other ROS reviewed and negative.     Physical Exam/Data:   Vitals:   05/02/22 0927 05/02/22 1128 05/02/22 1246 05/02/22 1311  BP:   (!) 111/57   Pulse: (!) 55 (!) 51 (!) 43 (!) 46  Resp:   20   Temp:   (!) 97.5 F (36.4 C)   TempSrc:      SpO2:   98%   Weight:      Height:        Intake/Output Summary (Last 24 hours) at 05/02/2022 1329 Last data filed at 05/02/2022 0018 Gross per 24 hour  Intake --  Output 1400 ml  Net -1400 ml      04/30/2022    6:25 PM 04/30/2022    5:48 AM 02/26/2022    9:16 PM  Last 3 Weights  Weight (lbs) 249 lb 251 lb 8.7 oz 220 lb  Weight (kg) 112.946 kg 114.1 kg 99.791 kg   VS:  BP (!) 111/57 (BP Location: Right Arm)   Pulse (!) 46   Temp (!) 97.5 F (36.4 C)   Resp 20   Ht '6\' 1"'$  (1.854 m)   Wt 112.9 kg   SpO2 98%   BMI 32.85 kg/m  , BMI Body mass index is 32.85 kg/m. GENERAL:  Well appearing HEENT: Pupils equal round and reactive, fundi not visualized, oral mucosa unremarkable NECK:  No jugular venous distention, waveform within normal limits, carotid upstroke brisk and symmetric, no bruits, no thyromegaly LUNGS:  Clear to auscultation bilaterally HEART:  Bradycardic.  Regularly irregular  PMI not displaced or sustained,S1 and S2 within normal limits, no S3, no S4, no clicks, no rubs, no murmurs ABD:  Flat, positive bowel sounds normal in frequency in pitch, no bruits, no rebound, no guarding, no midline pulsatile mass, no hepatomegaly, no splenomegaly EXT:  2 plus pulses throughout, no edema, no cyanosis no clubbing SKIN:  No rashes no nodules NEURO:  Cranial  nerves II through XII grossly intact, motor grossly intact throughout [facility]:  Cognitively intact, oriented to person place and time   EKG:  The EKG was personally reviewed and demonstrates:  Sinus rhythm.  Rate 45 bpm.   Second-degree AV block.  2: 1 AV conduction.  Left axis deviation.  Low voltage.  Prior inferior infarct.  Telemetry:  Telemetry was personally reviewed and demonstrates:  Sinus bradycardia.  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  Blocked PACs.  Ventricular rate 40s.   Relevant CV Studies:  Echo 04/30/2022:  1. Left ventricular ejection fraction, by estimation, is 50 to 55%. The  left ventricle has low normal function. The left ventricle has no regional  wall motion abnormalities. There is moderate concentric left ventricular  hypertrophy. Left ventricular  diastolic parameters are consistent with Grade I diastolic dysfunction  (impaired relaxation).   2. Right ventricular systolic function was not well visualized. The right  ventricular size is normal. Mildly increased right ventricular wall  thickness.   3. Left atrial size was mild to moderately dilated.   4. Right atrial size was mild to moderately dilated.   5. The mitral valve is normal in structure. No evidence of mitral valve  regurgitation.   6. The aortic valve is normal in structure. Aortic valve regurgitation is  not visualized.   Laboratory Data:  High Sensitivity Troponin:   Recent Labs  Lab 04/29/22 1632 04/29/22 2059  TROPONINIHS 30* 35*     Chemistry Recent Labs  Lab 04/30/22 1453 05/01/22 0738 05/02/22 0356  NA 137 138 137  K 3.7 3.5 3.3*  CL 102 103 102  CO2 '25 27 25  '$ GLUCOSE 114* 100* 95  BUN 22 24* 25*  CREATININE 1.47* 1.22 1.33*  CALCIUM 8.7* 8.6* 8.8*  GFRNONAA 48* 60* 54*  ANIONGAP '10 8 10    '$ Recent Labs  Lab 04/29/22 2230  PROT 8.3*  ALBUMIN 4.2  AST 27  ALT 20  ALKPHOS 79  BILITOT 1.2   Lipids No results for input(s): "CHOL", "TRIG", "HDL", "LABVLDL", "LDLCALC", "CHOLHDL" in the last 168 hours.  Hematology Recent Labs  Lab 04/29/22 1632 05/01/22 0738  WBC 8.6 6.8  RBC 4.76 4.49  HGB 12.5* 12.1*  HCT 41.1 36.8*  MCV 86.3 82.0  MCH 26.3 26.9  MCHC 30.4 32.9  RDW 15.1 15.2   PLT 216 218   Thyroid No results for input(s): "TSH", "FREET4" in the last 168 hours.  BNP Recent Labs  Lab 04/29/22 1642  BNP 119.6*    DDimer  Recent Labs  Lab 04/29/22 2243  DDIMER 0.78*     Radiology/Studies:  ECHOCARDIOGRAM COMPLETE  Result Date: 05/01/2022    ECHOCARDIOGRAM REPORT   Patient Name:   Kainon Varady. Date of Exam: 04/30/2022 Medical Rec #:  [id number]          Height:       73.0 in Accession #:    [id number]         Weight:       251.5 lb Date of Birth:  1940/09/10           BSA:          2.372 m Patient Age:    45 years           BP:           Not listed in chart/Not listed  in chart mmHg Patient Gender: M                  HR:           72 bpm. Exam Location:  [facility] Procedure: 2D Echo, Cardiac Doppler and Color Doppler Indications:     CHF-acute diastolic B34.19  History:         Patient has no prior history of Echocardiogram examinations.                  Stroke; Risk Factors:Hypertension.  Sonographer:     Sherrie Sport Referring Phys:  [id number] Loletha Grayer Diagnosing Phys: Yolonda Kida MD  Sonographer Comments: Technically challenging study due to limited acoustic windows, no apical window and no subcostal window. IMPRESSIONS  1. Left ventricular ejection fraction, by estimation, is 50 to 55%. The left ventricle has low normal function. The left ventricle has no regional wall motion abnormalities. There is moderate concentric left ventricular hypertrophy. Left ventricular diastolic parameters are consistent with Grade I diastolic dysfunction (impaired relaxation).  2. Right ventricular systolic function was not well visualized. The right ventricular size is normal. Mildly increased right ventricular wall thickness.  3. Left atrial size was mild to moderately dilated.  4. Right atrial size was mild to moderately dilated.  5. The mitral valve is normal in structure. No evidence of mitral valve regurgitation.  6. The  aortic valve is normal in structure. Aortic valve regurgitation is not visualized. FINDINGS  Left Ventricle: Left ventricular ejection fraction, by estimation, is 50 to 55%. The left ventricle has low normal function. The left ventricle has no regional wall motion abnormalities. The left ventricular internal cavity size was normal in size. There is moderate concentric left ventricular hypertrophy. Left ventricular diastolic parameters are consistent with Grade I diastolic dysfunction (impaired relaxation). Right Ventricle: The right ventricular size is normal. Mildly increased right ventricular wall thickness. Right ventricular systolic function was not well visualized. Left Atrium: Left atrial size was mild to moderately dilated. Right Atrium: Right atrial size was mild to moderately dilated. Pericardium: There is no evidence of pericardial effusion. Mitral Valve: The mitral valve is normal in structure. No evidence of mitral valve regurgitation. Tricuspid Valve: The tricuspid valve is normal in structure. Tricuspid valve regurgitation is not demonstrated. Aortic Valve: The aortic valve is normal in structure. Aortic valve regurgitation is not visualized. Pulmonic Valve: The pulmonic valve was normal in structure. Pulmonic valve regurgitation is not visualized. Aorta: The ascending aorta was not well visualized. IAS/Shunts: No atrial level shunt detected by color flow Doppler.  LEFT VENTRICLE PLAX 2D LVIDd:         4.10 cm LVIDs:         3.00 cm LV PW:         1.50 cm LV IVS:        1.70 cm LVOT diam:     2.20 cm LVOT Area:     3.80 cm  LEFT ATRIUM         Index LA diam:    4.80 cm 2.02 cm/m   AORTA Ao Root diam: 4.20 cm  SHUNTS Systemic Diam: 2.20 cm Yolonda Kida MD Electronically signed by Yolonda Kida MD Signature Date/Time: 05/01/2022/8:37:38 AM    Final    CT Angio Chest PE W and/or Wo Contrast  Result Date: 04/30/2022 CLINICAL DATA:  Increased weight gain and shortness of breath Positive  D-dimer EXAM: CT ANGIOGRAPHY CHEST CT ABDOMEN AND PELVIS WITH CONTRAST  TECHNIQUE: Multidetector CT imaging of the chest was performed using the standard protocol during bolus administration of intravenous contrast. Multiplanar CT image reconstructions and MIPs were obtained to evaluate the vascular anatomy. Multidetector CT imaging of the abdomen and pelvis was performed using the standard protocol during bolus administration of intravenous contrast. RADIATION DOSE REDUCTION: This exam was performed according to the departmental dose-optimization program which includes automated exposure control, adjustment of the mA and/or kV according to patient size and/or use of iterative reconstruction technique. CONTRAST:  133m OMNIPAQUE IOHEXOL 350 MG/ML SOLN COMPARISON:  CT abdomen and pelvis 11/21/2021; CT chest abdomen and pelvis 09/07/2021 FINDINGS: CTA CHEST FINDINGS Cardiovascular: Satisfactory opacification of the pulmonary arteries to the segmental level. No evidence of pulmonary embolism. Normal heart size. Advanced coronary artery calcification. Trace pericardial effusion. Mediastinum/Nodes: Similar prominent mediastinal adenopathy. Thyroid gland, trachea, and esophagus demonstrate no significant findings. Lungs/Pleura: Expiratory phase imaging. Air trapping bilaterally. Bronchial wall thickening and mucous plugging greatest in the lower lobes. No focal consolidation, pleural effusion, or pneumothorax. Musculoskeletal: No chest wall abnormality. No acute osseous findings. Review of the MIP images confirms the above findings. CT ABDOMEN and PELVIS FINDINGS Hepatobiliary: No acute abnormality. 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 No follow-up recommended. Unremarkable gallbladder. No biliary dilation. Pancreas: Unremarkable. No pancreatic ductal dilatation or surrounding inflammatory changes. Spleen: Normal in size without focal abnormality. Adrenals/Urinary Tract: Normal adrenal glands. Left  nephrectomy. Unremarkable right kidney. No urinary calculi or hydronephrosis. Foley catheter in nondistended bladder. Stomach/Bowel: Normal caliber large and small bowel. The appendix is not visualized. Vascular/Lymphatic: Advanced aorto bi-iliac calcified atherosclerosis. No aneurysm. No suspicious lymphadenopathy. Reproductive: Enlarged prostate. Other: No free intraperitoneal fluid or air. 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 Review of the MIP images confirms the above findings. IMPRESSION: 1. Negative for acute pulmonary embolism. 2. Advanced coronary artery and Aortic Atherosclerosis (ICD10-I70.0). 3. Bronchial wall thickening and air trapping compatible with chronic bronchitis/reactive airways. 4. No acute abnormality in the abdomen or pelvis. 5. Enlarged prostate. Electronically Signed   By: TPlacido SouM.D.   On: 04/30/2022 00:50   CT ABDOMEN PELVIS W CONTRAST  Result Date: 04/30/2022 CLINICAL DATA:  Increased weight gain and shortness of breath Positive D-dimer EXAM: CT ANGIOGRAPHY CHEST CT ABDOMEN AND PELVIS WITH CONTRAST TECHNIQUE: Multidetector CT imaging of the chest was performed using the standard protocol during bolus administration of intravenous contrast. Multiplanar CT image reconstructions and MIPs were obtained to evaluate the vascular anatomy. Multidetector CT imaging of the abdomen and pelvis was performed using the standard protocol during bolus administration of intravenous contrast. RADIATION DOSE REDUCTION: This exam was performed according to the departmental dose-optimization program which includes automated exposure control, adjustment of the mA and/or kV according to patient size and/or use of iterative reconstruction technique. CONTRAST:  1053mOMNIPAQUE IOHEXOL 350 MG/ML SOLN COMPARISON:  CT abdomen and pelvis 11/21/2021; CT chest abdomen and pelvis 09/07/2021 FINDINGS: CTA CHEST FINDINGS Cardiovascular: Satisfactory  opacification of the pulmonary arteries to the segmental level. No evidence of pulmonary embolism. Normal heart size. Advanced coronary artery calcification. Trace pericardial effusion. Mediastinum/Nodes: Similar prominent mediastinal adenopathy. Thyroid gland, trachea, and esophagus demonstrate no significant findings. Lungs/Pleura: Expiratory phase imaging. Air trapping bilaterally. Bronchial wall thickening and mucous plugging greatest in the lower lobes. No focal consolidation, pleural effusion, or pneumothorax. Musculoskeletal: No chest wall abnormality. No acute osseous findings. Review of the MIP images confirms the above findings. CT ABDOMEN and PELVIS FINDINGS Hepatobiliary: No acute abnormality. Stable cysts/hemangioma  in the left hepatic lobe. No follow-up recommended. Unremarkable gallbladder. No biliary dilation. Pancreas: Unremarkable. No pancreatic ductal dilatation or surrounding inflammatory changes. Spleen: Normal in size without focal abnormality. Adrenals/Urinary Tract: Normal adrenal glands. Left nephrectomy. Unremarkable right kidney. No urinary calculi or hydronephrosis. Foley catheter in nondistended bladder. Stomach/Bowel: Normal caliber large and small bowel. The appendix is not visualized. Vascular/Lymphatic: Advanced aorto bi-iliac calcified atherosclerosis. No aneurysm. No suspicious lymphadenopathy. Reproductive: Enlarged prostate. Other: No free intraperitoneal fluid or air. 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 Review of the MIP images confirms the above findings. IMPRESSION: 1. Negative for acute pulmonary embolism. 2. Advanced coronary artery and Aortic Atherosclerosis (ICD10-I70.0). 3. Bronchial wall thickening and air trapping compatible with chronic bronchitis/reactive airways. 4. No acute abnormality in the abdomen or pelvis. 5. Enlarged prostate. Electronically Signed   By: Placido Sou M.D.   On: 04/30/2022 00:50   US Venous  Img Lower Bilateral  Result Date: 04/30/2022 CLINICAL DATA:  Bilateral lower extremity edema EXAM: BILATERAL LOWER EXTREMITY VENOUS DOPPLER ULTRASOUND TECHNIQUE: Gray-scale sonography with compression, as well as color and duplex ultrasound, were performed to evaluate the deep venous system(s) from the level of the common femoral vein through the popliteal and proximal calf veins. COMPARISON:  None Available. FINDINGS: VENOUS Normal compressibility of the common femoral, superficial femoral, and popliteal veins, as well as the visualized calf veins. Visualized portions of profunda femoral vein and great saphenous vein unremarkable. No filling defects to suggest DVT on grayscale or color Doppler imaging. Doppler waveforms show normal direction of venous flow, normal respiratory plasticity and response to augmentation. Limited views of the contralateral common femoral vein are unremarkable. OTHER None. Limitations: none IMPRESSION: Negative. Electronically Signed   By: Ulyses Jarred M.D.   On: 04/30/2022 00:36   US Abdomen Limited  Result Date: 04/30/2022 CLINICAL DATA:  Abdominal distension EXAM: LIMITED ABDOMEN ULTRASOUND FOR ASCITES TECHNIQUE: Limited ultrasound survey for ascites was performed in all four abdominal quadrants. COMPARISON:  None Available. FINDINGS: No ascites visualized. IMPRESSION: No ascites visualized. Electronically Signed   By: Ulyses Jarred M.D.   On: 04/30/2022 00:31   DG Chest 2 View  Result Date: 04/29/2022 CLINICAL DATA:  Shortness of breath EXAM: CHEST - 2 VIEW COMPARISON:  Radiographs 02/11/2022 FINDINGS: Stable cardiomediastinal silhouette. Aortic atherosclerotic calcification. Calcified mediastinal nodes. No focal consolidation, pleural effusion, or pneumothorax. No acute osseous abnormality. Elevated right hemidiaphragm. IMPRESSION: No active cardiopulmonary disease. Electronically Signed   By: Placido Sou M.D.   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  Cannot rule out Mobitz 1 given that he has 2-1 conduction.  P-RR intervals are consistent throughout.  At this time he does not seem to be symptomatic.  He walked with PT this morning and denied symptoms.  We will ask our nursing staff to walk with him again and see how he feels.  There is a chance that this could progress to third-degree AV block.  He understands that this will put him at risk for falls and injury as well as syncope.  He is not on any nodal agents.  The patient is on Donepezil, which may contribute to the bradycardia.  Recommend holding the donepezil.  Recommend outpatient live monitor and close follow-up with his cardiologist.  Check TSH.  # Hypertension:  BP stable on losartan and spironolactone.  # CAD: #  Hyperlipidemia:  Continue aspirin, atorvastatin.  Risk Assessment/Risk Scores:                For questions or updates, please contact [place] Please consult [email or web address] for contact info under    Signed, Skeet Latch, MD  05/02/2022 1:29 PM

## 2022-05-03 ENCOUNTER — Encounter
Admission: EM | Disposition: A | Discharge status: Home Health | Payer: Self-pay | Source: Home / Self Care | Attending: Internal Medicine

## 2022-05-03 ENCOUNTER — Other Ambulatory Visit: Payer: Self-pay

## 2022-05-03 DIAGNOSIS — R601 Generalized edema: Secondary | ICD-10-CM | POA: Diagnosis not present

## 2022-05-03 DIAGNOSIS — I442 Atrioventricular block, complete: Secondary | ICD-10-CM

## 2022-05-03 HISTORY — PX: TEMPORARY PACEMAKER: CATH118268

## 2022-05-03 LAB — BASIC METABOLIC PANEL
Anion gap: 10 (ref 5–15)
BUN: 28 mg/dL — ABNORMAL HIGH (ref 8–23)
CO2: 22 mmol/L (ref 22–32)
Calcium: 9 mg/dL (ref 8.9–10.3)
Chloride: 104 mmol/L (ref 98–111)
Creatinine, Ser: 1.29 mg/dL — ABNORMAL HIGH (ref 0.61–1.24)
GFR, Estimated: 56 mL/min — ABNORMAL LOW (ref >60)
Glucose, Bld: 110 mg/dL — ABNORMAL HIGH (ref 70–99)
Potassium: 3.8 mmol/L (ref 3.5–5.1)
Sodium: 136 mmol/L (ref 135–145)

## 2022-05-03 LAB — GLUCOSE, CAPILLARY: Glucose-Capillary: 106 mg/dL — ABNORMAL HIGH (ref 70–99)

## 2022-05-03 SURGERY — TEMPORARY PACEMAKER
Anesthesia: Moderate Sedation

## 2022-05-03 MED ORDER — LABETALOL HCL 5 MG/ML IV SOLN: 10.0000 mg | INTRAVENOUS | Status: DC | PRN

## 2022-05-03 MED ORDER — SODIUM CHLORIDE 0.9% FLUSH: 3.0000 mL | Freq: Two times a day (BID) | INTRAVENOUS | Status: DC

## 2022-05-03 MED ORDER — TRAMADOL HCL 50 MG PO TABS
50.0000 mg | ORAL_TABLET | Freq: Four times a day (QID) | ORAL | Status: DC | PRN
  Administered 2022-05-03 (×2): 50 mg via ORAL
  Filled 2022-05-03 (×2): qty 1

## 2022-05-03 MED ORDER — HYDRALAZINE HCL 20 MG/ML IJ SOLN: 10.0000 mg | INTRAMUSCULAR | Status: DC | PRN

## 2022-05-03 MED ORDER — ONDANSETRON HCL 4 MG/2ML IJ SOLN: 4.0000 mg | Freq: Four times a day (QID) | INTRAMUSCULAR | Status: DC | PRN

## 2022-05-03 MED ORDER — SODIUM CHLORIDE 0.9 % IV SOLN
INTRAVENOUS | Status: AC | PRN
  Administered 2022-05-03: 20 mL/h via INTRAVENOUS
  Administered 2022-05-03: 10 mL/h via INTRAVENOUS

## 2022-05-03 MED ORDER — HYDROCORTISONE 1 % EX CREA
TOPICAL_CREAM | Freq: Three times a day (TID) | CUTANEOUS | Status: DC
  Filled 2022-05-03: qty 28

## 2022-05-03 MED ORDER — HEPARIN SODIUM (PORCINE) 1000 UNIT/ML IJ SOLN
INTRAMUSCULAR | Status: AC
  Filled 2022-05-03: qty 10

## 2022-05-03 MED ORDER — ACETAMINOPHEN 325 MG PO TABS: 650.0000 mg | ORAL_TABLET | ORAL | Status: DC | PRN

## 2022-05-03 MED ORDER — SODIUM CHLORIDE 0.9 % IV SOLN: 250.0000 mL | INTRAVENOUS | Status: DC | PRN

## 2022-05-03 MED ORDER — MIDAZOLAM HCL 2 MG/2ML IJ SOLN
INTRAMUSCULAR | Status: AC
  Filled 2022-05-03: qty 2

## 2022-05-03 MED ORDER — ACETAMINOPHEN 10 MG/ML IV SOLN
1000.0000 mg | INTRAVENOUS | Status: AC
  Administered 2022-05-03: 1000 mg via INTRAVENOUS
  Filled 2022-05-03: qty 100

## 2022-05-03 MED ORDER — HEPARIN (PORCINE) IN NACL 1000-0.9 UT/500ML-% IV SOLN
INTRAVENOUS | Status: DC | PRN
  Administered 2022-05-03: 500 mL

## 2022-05-03 MED ORDER — FENTANYL CITRATE (PF) 100 MCG/2ML IJ SOLN
INTRAMUSCULAR | Status: AC
  Filled 2022-05-03: qty 2

## 2022-05-03 MED ORDER — VERAPAMIL HCL 2.5 MG/ML IV SOLN
INTRAVENOUS | Status: AC
  Filled 2022-05-03: qty 2

## 2022-05-03 MED ORDER — CHLORHEXIDINE GLUCONATE CLOTH 2 % EX PADS: 6.0000 | MEDICATED_PAD | Freq: Every day | CUTANEOUS | Status: DC

## 2022-05-03 MED ORDER — HEPARIN (PORCINE) IN NACL 1000-0.9 UT/500ML-% IV SOLN
INTRAVENOUS | Status: AC
  Filled 2022-05-03: qty 1000

## 2022-05-03 MED ORDER — SODIUM CHLORIDE 0.9% FLUSH: 3.0000 mL | INTRAVENOUS | Status: DC | PRN

## 2022-05-03 SURGICAL SUPPLY — 14 items
CABLE ADAPT PACING TEMP 12FT (ADAPTER) IMPLANT
CATH S G BIP PACING (CATHETERS) IMPLANT
COVER PROBE ULTRASOUND 5X96 (MISCELLANEOUS) IMPLANT
DRAPE BRACHIAL (DRAPES) IMPLANT
NDL PERC 18GX7CM (NEEDLE) IMPLANT
NEEDLE PERC 18GX7CM (NEEDLE) ×1 IMPLANT
PACK CARDIAC CATH (CUSTOM PROCEDURE TRAY) ×1 IMPLANT
PAD ELECT DEFIB RADIOL ZOLL (MISCELLANEOUS) IMPLANT
PROTECTION STATION PRESSURIZED (MISCELLANEOUS) ×1
SET ATX SIMPLICITY (MISCELLANEOUS) IMPLANT
SHEATH AVANTI 6FR X 11CM (SHEATH) IMPLANT
SLEEVE REPOSITIONING LENGTH 30 (MISCELLANEOUS) IMPLANT
STATION PROTECTION PRESSURIZED (MISCELLANEOUS) IMPLANT
SUT SILK 0 FSL (SUTURE) IMPLANT

## 2022-05-03 NOTE — Progress Notes (Signed)
Rounding Note    Patient Name: Adam Rios. Date of Encounter: 05/03/2022  [facility] Cardiologist: None   Subjective   Patient reported feeling lightheaded while sitting in chair. Noted to be in CHB with wide ventricular escape.  Rate 41 bpm.   Inpatient Medications    Scheduled Meds:  aspirin EC  81 mg Oral Daily   atorvastatin  40 mg Oral Daily   Chlorhexidine Gluconate Cloth  6 each Topical Daily   Chlorhexidine Gluconate Cloth  6 each Topical Q0600   cyanocobalamin  1,000 mcg Oral Daily   enoxaparin (LOVENOX) injection  0.5 mg/kg Subcutaneous Q24H   finasteride  5 mg Oral Daily   furosemide  20 mg Oral BID   hydrocortisone cream   Topical TID   losartan  25 mg Oral Daily   mupirocin ointment  1 Application Nasal BID   pantoprazole  40 mg Oral Daily   polyethylene glycol  17 g Oral Daily   senna-docusate  1 tablet Oral BID   spironolactone  25 mg Oral Daily   tamsulosin  0.4 mg Oral Daily   Continuous Infusions:  PRN Meds: acetaminophen **OR** acetaminophen, diclofenac Sodium, hydrocortisone cream, lidocaine, ondansetron **OR** ondansetron (ZOFRAN) IV, senna-docusate, traMADol, traZODone   Vital Signs    Vitals:   05/03/22 0038 05/03/22 0420 05/03/22 0822 05/03/22 1130  BP: 122/71 130/74 116/68 110/72  Pulse: 72 67 (!) 56 78  Resp: '16 16 18 17  '$ Temp: 98.7 F (37.1 C) 98.4 F (36.9 C) 97.9 F (36.6 C) 98.3 F (36.8 C)  TempSrc: Oral  Oral Oral  SpO2: 98% 94% 96% 96%  Weight:      Height:        Intake/Output Summary (Last 24 hours) at 05/03/2022 1452 Last data filed at 05/03/2022 1130 Gross per 24 hour  Intake 240 ml  Output 1125 ml  Net -885 ml      04/30/2022    6:25 PM 04/30/2022    5:48 AM 02/26/2022    9:16 PM  Last 3 Weights  Weight (lbs) 249 lb 251 lb 8.7 oz 220 lb  Weight (kg) 112.946 kg 114.1 kg 99.791 kg      Telemetry    Sinus rhythm, 2:1 AV block, CHB - Personally Reviewed  ECG    N/a - Personally  Reviewed  Physical Exam   VS:  BP 110/72 (BP Location: Left Arm)   Pulse 78   Temp 98.3 F (36.8 C) (Oral)   Resp 17   Ht '6\' 1"'$  (1.854 m)   Wt 112.9 kg   SpO2 96%   BMI 32.85 kg/m  , BMI Body mass index is 32.85 kg/m. GENERAL:  Well appearing.  Anxious HEENT: Pupils equal round and reactive, fundi not visualized, oral mucosa unremarkable NECK:  No jugular venous distention, waveform within normal limits, carotid upstroke brisk and symmetric, no bruits, no thyromegaly LUNGS:  Clear to auscultation bilaterally HEART:  Bradycardic.  Regular rhythm.  PMI not displaced or sustained,S1 and S2 within normal limits, no S3, no S4, no clicks, no rubs, no murmurs ABD:  Flat, positive bowel sounds normal in frequency in pitch, no bruits, no rebound, no guarding, no midline pulsatile mass, no hepatomegaly, no splenomegaly EXT:  2 plus pulses throughout, no edema, no cyanosis no clubbing SKIN:  No rashes no nodules NEURO:  Cranial nerves II through XII grossly intact, motor grossly intact throughout PSYCH:  Cognitively intact, oriented to person place and time  Labs    High Sensitivity Troponin:   Recent Labs  Lab 04/29/22 1632 04/29/22 2059  TROPONINIHS 30* 35*     Chemistry Recent Labs  Lab 04/29/22 2230 04/30/22 1453 05/01/22 0738 05/02/22 0356 05/03/22 0837  NA  --    < > 138 137 136  K  --    < > 3.5 3.3* 3.8  CL  --    < > 103 102 104  CO2  --    < > '27 25 22  '$ GLUCOSE  --    < > 100* 95 110*  BUN  --    < > 24* 25* 28*  CREATININE  --    < > 1.22 1.33* 1.29*  CALCIUM  --    < > 8.6* 8.8* 9.0  PROT 8.3*  --   --   --   --   ALBUMIN 4.2  --   --   --   --   AST 27  --   --   --   --   ALT 20  --   --   --   --   ALKPHOS 79  --   --   --   --   BILITOT 1.2  --   --   --   --   GFRNONAA  --    < > 60* 54* 56*  ANIONGAP  --    < > '8 10 10   '$ < > = values in this interval not displayed.    Lipids No results for input(s): "CHOL", "TRIG", "HDL", "LABVLDL", "LDLCALC",  "CHOLHDL" in the last 168 hours.  Hematology Recent Labs  Lab 04/29/22 1632 05/01/22 0738  WBC 8.6 6.8  RBC 4.76 4.49  HGB 12.5* 12.1*  HCT 41.1 36.8*  MCV 86.3 82.0  MCH 26.3 26.9  MCHC 30.4 32.9  RDW 15.1 15.2  PLT 216 218   Thyroid  Recent Labs  Lab 05/02/22 0354  TSH 2.382    BNP Recent Labs  Lab 04/29/22 1642  BNP 119.6*    DDimer  Recent Labs  Lab 04/29/22 2243  DDIMER 0.78*     Radiology    No results found.  Cardiac Studies   Echo 04/30/2022:  1. Left ventricular ejection fraction, by estimation, is 50 to 55%. The  left ventricle has low normal function. The left ventricle has no regional  wall motion abnormalities. There is moderate concentric left ventricular  hypertrophy. Left ventricular  diastolic parameters are consistent with Grade I diastolic dysfunction  (impaired relaxation).   2. Right ventricular systolic function was not well visualized. The right  ventricular size is normal. Mildly increased right ventricular wall  thickness.   3. Left atrial size was mild to moderately dilated.   4. Right atrial size was mild to moderately dilated.   5. The mitral valve is normal in structure. No evidence of mitral valve  regurgitation.   6. The aortic valve is normal in structure. Aortic valve regurgitation is  not visualized.     Patient Profile     81 y.o. male with a hx of hypertension, renal cell carcinoma status post nephrectomy, morbid obesity, neurogenic bladder with chronic indwelling Foley catheter now with symptomatic high degree AV block.  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  Now also intermittent CHB and he is symptomatic.  Wide ventricular escape. We will get a temp wire.  EP to see in AM.  Atropine at bedside.  He is not on any nodal agents. Donepazil on hold. Complicated by chronic indwelling foley and increased infection risk.  TSH is wnl.   #  Hypertension:  BP stable on losartan and spironolactone.  Hold for now until temp wire in place   # CAD: # Hyperlipidemia:  Continue aspirin, atorvastatin.    Total critical care time: 35 minutes. Critical care time was exclusive of separately billable procedures and treating other patients. Critical care was necessary to treat or prevent imminent or life-threatening deterioration. Critical care was time spent personally by me on the following activities: development of treatment plan with patient and/or surrogate as well as nursing, discussions with consultants, evaluation of patient's response to treatment, examination of patient, obtaining history from patient or surrogate, ordering and performing treatments and interventions, ordering and review of laboratory studies, ordering and review of radiographic studies, pulse oximetry and re-evaluation of patient's condition.     For questions or updates, please contact [place] Please consult [email or web address] for contact info under        Signed, Skeet Latch, MD  05/03/2022, 2:52 PM

## 2022-05-03 NOTE — Progress Notes (Signed)
Report given to Joey with Boaz RN at [facility] at 8:11P.M. Patient to be transferred to [facility] Cardiac ICU room 3726.

## 2022-05-03 NOTE — CV Procedure (Signed)
Temporary pacemaker placement Indication symptomatic bradycardia complete heart block Rate 30s over 40s  Asked to place a temporary pacemaker because of symptomatic bradycardia patient was on telemetry Patient was brought to the cardiac Cath Lab 6 French sheath was placed in the right femoral vein after 10 cc of 1% lidocaine restenosis area.  Insertion was performed under ultrasound guidance. We then were able to float a 5 French pacer wire into the right ventricle and obtain capture We lost capture at 0.8 so it was set at 2 rate of 80  Patient tolerated procedure well No complications  Patient is being transferred to ICU In anticipation of permanent pacemaker Cardiology management transferred back to [facility]

## 2022-05-03 NOTE — Progress Notes (Signed)
PROGRESS NOTE  Adam Rios.  WJX:[id number] DOB: 03/21/1941 DOA: 04/29/2022 PCP: Dola Argyle, MD   Brief Narrative: Patient is a 81 year old male with history of hypertension, morbid obesity, renal cell carcinoma status post nephrectomy, neurogenic bladder with chronic Foley catheter, lower extremity weakness ambulates with the help of rollator presents here with complaint of shortness of breath, lower extremity edema, weight gain.  On presentation, he was found to be severely volume overloaded with bilateral lower extremity edema. Started on IV Lasix.  During his hospitalization, he was noticed to have 2nd degree AV block,cardiology consulted.  Planning to send him home with heart monitor but could not do on the weekend so planning for Monday.    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 Started on IV Lasix.  Echo showed EF of 50 to 21%, grade 1 diastolic dysfunction. Significant improvement in the anasarca.Discontinued  IV Lasix and started on home dose 20 mg twice daily.  Continue spironolactone and losartan.  2nd  degree AV block: Patient remains bradycardic with heart rate in the range of 40-50.  Asymptomatic.  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  Patient is asymptomatic.  Cardiology recommending heart monitor on discharge, which will likely happen on Monday.  Donepezil discontinued.  TSH normal  Hypertension: Currently blood  pressure stable.  Continue spironolactone, losartan  History of dementia: Currently alert oriented.  Donepezil at home, now discontinued  Constipation: Continue bowel regimen  Hyperlipidemia: On Lipitor  History of BPH/lower urinary tract symptoms/obstruction: Has chronic Foley catheter.  Follows with urology.  Continue Proscar, Myrbetriq, tamsulosin  History of renal cell carcinoma: Status post nephrectomy left side.  Currently kidney function stable.  Obesity: BMI 32.8  Weakness/deconditioning:PT/OT recommend home health on discharge        DVT prophylaxis:Lovenox     Code Status: Full Code  Family Communication: Called and discussed with daughter on phone on 12/2  Patient status:Inpatient  Patient is from :Home  Anticipated discharge HY:QMVH  Estimated DC date: Monday  Consultants: Cardiology  Procedures:None  Antimicrobials:  Anti-infectives (From admission, onward)    None       Subjective: Seen and examined at bedside today.  Hemodynamically stable.  Complain of some pain on the bilateral lower extremities.  Left lower extremity edema have significantly improved  Objective: Vitals:   05/02/22 2000 05/03/22 0038 05/03/22 0420 05/03/22 0822  BP: (!) 106/57 122/71 130/74 116/68  Pulse: (!) 38 72 67 (!) 56  Resp:  '16 16 18  '$ Temp: 98.9 F (37.2 C) 98.7 F (37.1 C) 98.4 F (36.9 C) 97.9 F (36.6 C)  TempSrc: Oral Oral  Oral  SpO2: 94% 98% 94% 96%  Weight:      Height:        Intake/Output Summary (Last 24 hours) at 05/03/2022 1124 Last data filed at 05/03/2022 0830 Gross per 24 hour  Intake 240 ml  Output 1125 ml  Net -885 ml   Filed Weights   04/30/22  4970 04/30/22 1825  Weight: 114.1 kg 112.9 kg    Examination:  General exam: Overall comfortable, not in distress, obese, deconditioned HEENT: PERRL Respiratory system:  no wheezes or crackles  Cardiovascular system: S1 & S2 heard, RRR.  Gastrointestinal system: Abdomen is nondistended,  soft and nontender. Central nervous system: Alert and oriented Extremities: Left lower extremity edema, chronic venous stasis changes Skin: No rashes, no ulcers,no icterus   GU: Foley catheter   Data Reviewed: I have personally reviewed following labs and imaging studies  CBC: Recent Labs  Lab 04/29/22 1632 05/01/22 0738  WBC 8.6 6.8  HGB 12.5* 12.1*  HCT 41.1 36.8*  MCV 86.3 82.0  PLT 216 263   Basic Metabolic Panel: Recent Labs  Lab 04/29/22 1632 04/30/22 1453 05/01/22 0738 05/02/22 0356 05/03/22 0837  NA 136 137 138 137 136  K 3.9 3.7 3.5 3.3* 3.8  CL 102 102 103 102 104  CO2 '25 25 27 25 22  '$ GLUCOSE 96 114* 100* 95 110*  BUN 21 22 24* 25* 28*  CREATININE 1.11 1.47* 1.22 1.33* 1.29*  CALCIUM 9.1 8.7* 8.6* 8.8* 9.0     Recent Results (from the past 240 hour(s))  Urine Culture     Status: Abnormal   Collection Time: 04/30/22  1:47 AM   Specimen: Urine, Random  Result Value Ref Range Status   Specimen Description   Final    URINE, RANDOM Performed at [facility], [address] [place], [place] [address]    Special Requests   Final    NONE Performed at [facility], [address] [place], [place] [address]    Culture MULTIPLE SPECIES PRESENT, SUGGEST RECOLLECTION (A)  Final   Report Status 05/01/2022 FINAL  Final  MRSA Next Gen by PCR, Nasal     Status: Abnormal   Collection Time: 05/01/22 11:36 AM   Specimen: Nasal Mucosa; Nasal Swab  Result Value Ref Range Status   MRSA by PCR Next Gen DETECTED (A) NOT DETECTED Final    Comment: RESULT CALLED TO, READ BACK BY AND VERIFIED WITH: SAINABOU TAMBAJANG AT 1411 05/01/22.PMF (NOTE) The GeneXpert MRSA Assay (FDA approved for NASAL specimens only), is one component of a comprehensive MRSA colonization surveillance program. It is not intended to diagnose MRSA infection nor to guide or monitor treatment for MRSA infections. Test performance is not FDA approved in patients less than 38  years old. Performed at [facility], [address], [place],  [address]      Radiology Studies: No results found.  Scheduled Meds:  aspirin EC  81 mg Oral Daily   atorvastatin  40 mg Oral Daily   Chlorhexidine Gluconate Cloth  6 each Topical Daily   Chlorhexidine Gluconate Cloth  6 each Topical Q0600   cyanocobalamin  1,000 mcg Oral Daily   enoxaparin (LOVENOX) injection  0.5 mg/kg Subcutaneous Q24H   finasteride  5 mg Oral Daily   furosemide  20 mg Oral BID   losartan  25 mg Oral Daily   mupirocin ointment  1 Application Nasal BID   pantoprazole  40 mg Oral Daily   polyethylene glycol  17 g Oral Daily   senna-docusate  1 tablet Oral BID   spironolactone  25 mg Oral Daily   tamsulosin  0.4 mg Oral Daily   Continuous Infusions:   LOS: 3 days   Shelly Coss, MD [facility] P12/07/2021, 11:24 AM

## 2022-05-03 NOTE — Consult Note (Signed)
Pulmonary and Critical care    NAME:  Adam Rios., MRN:  [id number], DOB:  02/28/1941, LOS: 3 ADMISSION DATE:  04/29/2022,     History of Present Illness:  The patient is a 81 year old male with history of essential hypertension moderate obesity history of nephrectomy and also history of renal cell carcinoma.  Patient presented to the emergency room with increased weight gain and shortness of breath on November 29. The patient was noted to have anasarca and acute on chronic diastolic heart failure exacerbation.  Initially in the ER patient was noted to have atrial fibrillation with rapid ventricular response.  Patient improved and was being planned for discharge however was noted to have bradycardia and subsequently cardiology was concerned started was found to have second-degree heart block and  2: 1 AV conduction.  December 3 patient was noted to have lightheadedness while sitting in a chair and was found to have ventricular escape with a rate of 41.  Patient subsequently had a temporary transvenous pacemaker wire placed.  the patient has chronic back pain and has been experiencing back pain while laying flat.  He denies chest pain denies coughing or sputum production denies shortness of breath denies focal logic weakness.        05/03/2022    5:30 PM 05/03/2022    5:15 PM 05/03/2022    5:04 PM  Vitals with BMI  Weight   230 lbs 13 oz  BMI   68.34  Systolic 98 196 222  Diastolic 71 74 69  Pulse 79 79         Examination: General: Alert somewhat uncomfortable HENT: No icterus no JVD Lungs: Air entry heard bilaterally no wheezing Cardiovascular: S1-S2 heard no murmurs Abdomen: Soft nontender bowel sounds were heard Extremities: No cyanosis  Neuro.  Alert and oriented to time place and person   Assessment & Plan:  Symptomatic bradycardia s/p temporary pacemaker wire Acute on chronic diastolic heart failure exacerbation Improving anasarca History of nephrectomy and renal  cancer History of neurogenic bladder and chronic Foley catheter Plan Require permanent pacemaker for cardiology Review of discharge summary indicates, plan for discharge to [facility] for pacemaker placement. Intravenous acetaminophen for pain control. Current heart rate is 80 hemodynamically stable.       Labs   CBC: Recent Labs  Lab 04/29/22 1632 05/01/22 0738  WBC 8.6 6.8  HGB 12.5* 12.1*  HCT 41.1 36.8*  MCV 86.3 82.0  PLT 216 979    Basic Metabolic Panel: Recent Labs  Lab 04/29/22 1632 04/30/22 1453 05/01/22 0738 05/02/22 0356 05/03/22 0837  NA 136 137 138 137 136  K 3.9 3.7 3.5 3.3* 3.8  CL 102 102 103 102 104  CO2 '25 25 27 25 22  '$ GLUCOSE 96 114* 100* 95 110*  BUN 21 22 24* 25* 28*  CREATININE 1.11 1.47* 1.22 1.33* 1.29*  CALCIUM 9.1 8.7* 8.6* 8.8* 9.0   GFR: Estimated Creatinine Clearance: 57 mL/min (A) (by C-G formula based on SCr of 1.29 mg/dL (H)). Recent Labs  Lab 04/29/22 1632 05/01/22 0738  WBC 8.6 6.8    Liver Function Tests: Recent Labs  Lab 04/29/22 2230  AST 27  ALT 20  ALKPHOS 79  BILITOT 1.2  PROT 8.3*  ALBUMIN 4.2   No results for input(s): "LIPASE", "AMYLASE" in the last 168 hours. No results for input(s): "AMMONIA" in the last 168 hours.  ABG    Component Value Date/Time   PHART 7.37 02/11/2022 1125   PCO2ART 37  02/11/2022 1125   PO2ART 97 02/11/2022 1125   HCO3 21.4 02/11/2022 1125   ACIDBASEDEF 3.4 (H) 02/11/2022 1125   O2SAT 99 02/11/2022 1125     Coagulation Profile: No results for input(s): "INR", "PROTIME" in the last 168 hours.  Cardiac Enzymes: No results for input(s): "CKTOTAL", "CKMB", "CKMBINDEX", "TROPONINI" in the last 168 hours.  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   Surgical History:   Past Surgical History:  Procedure Laterality Date   APPENDECTOMY     CATARACT EXTRACTION W/PHACO Right 07/22/2020   Procedure: CATARACT EXTRACTION PHACO AND INTRAOCULAR LENS PLACEMENT (IOC) RIGHT;  Surgeon: Eulogio Bear, MD;  Location: [facility];  Service: Ophthalmology;  Laterality: Right;  4.77 0:44.2   CATARACT EXTRACTION W/PHACO Left 08/05/2020   Procedure: CATARACT EXTRACTION PHACO AND INTRAOCULAR LENS PLACEMENT (IOC) LEFT 10.73 01:13.8;  Surgeon: Eulogio Bear, MD;  Location: [place];  Service: Ophthalmology;  Laterality: Left;  Use eye stretcher not chair   HERNIA REPAIR     LUMBAR FUSION     NEPHRECTOMY Left 2005     Social History:   reports that he quit smoking about 18 years ago. His smoking use included cigarettes. He has a 2.50 pack-year smoking history. He has never used smokeless tobacco. He reports that he does not currently use alcohol. He reports that he does not use drugs.   Family History:  His family history includes Cerebral aneurysm in his father and sister.   Allergies Allergies  Allergen Reactions   Fentanyl Nausea Only   Oxycodone Nausea And Vomiting     Home Medications  Prior to Admission medications   Medication Sig Start Date End Date Taking? Authorizing Provider  acetaminophen (TYLENOL) 325 MG tablet Take 500 mg by mouth every 6 (six) hours as needed. 08/14/14  Yes [provider]  aspirin 81 MG EC tablet Take 81 mg by mouth daily as needed.   Yes [provider]  atorvastatin (LIPITOR) 40 MG tablet Take 40 mg by mouth daily.   Yes [provider]  diclofenac Sodium (VOLTAREN) 1 % GEL Apply 2 g topically 2 (two) times daily as needed. 05/11/21  Yes [provider]  donepezil (ARICEPT) 10 MG tablet Take 10 mg by mouth at bedtime.   Yes [provider]  finasteride (PROSCAR) 5 MG tablet Take 5 mg by mouth daily. 04/27/22  Yes [provider]  furosemide (LASIX) 20 MG tablet Take 20 mg by mouth 2 (two) times daily.   Yes [provider]  gabapentin (NEURONTIN) 300 MG capsule Take 1 capsule (300 mg total) by mouth 2 (two) times daily. Patient taking differently: Take 300-600 mg by mouth as directed. 2 in the am, 4 after lunch (May split up lunch dose [1 after lunch and 3 at bedtime]). 11/28/21  Yes Sreenath, Sudheer B, MD  hydrocortisone cream ([employer] CORTISONE MAXIMUM STRENGTH) 1 % Apply 1 Application topically as needed for itching.   Yes [provider]  lidocaine (LIDODERM) 5 % Place 2 patches onto the skin daily as needed. 09/04/21  Yes [provider]  losartan (COZAAR) 25 MG tablet Hold until  followup with your outpatient provider because your blood pressure was low in the hospital. Patient taking differently: Take 25 mg by mouth daily. 02/14/22  Yes Enzo Bi, MD  mirabegron ER (MYRBETRIQ) 25 MG TB24 tablet Take 25 mg by mouth daily.   Yes [provider]  omeprazole (PRILOSEC) 20 MG capsule Take 1 capsule (20 mg total) by mouth daily. 05/21/21  Yes Jennye Boroughs, MD  senna-docusate (SENOKOT-S) 8.6-50 MG tablet Take 1 tablet by mouth 2 (two) times daily. Patient taking differently: Take 1 tablet by mouth 2 (two) times daily as needed. 09/10/21  Yes Dwyane Dee, MD  spironolactone (ALDACTONE) 25 MG tablet Hold until followup with your outpatient provider because your blood pressure was low in the hospital. Patient taking differently: Take 25 mg by mouth daily. 02/14/22  Yes Enzo Bi, MD  tamsulosin (FLOMAX) 0.4 MG CAPS capsule Take 0.4 mg by mouth daily.   Yes [provider]  traZODone (DESYREL) 50 MG tablet Take 50-100 mg by mouth at bedtime. 08/12/21  Yes [provider]  vitamin B-12 (CYANOCOBALAMIN) 1000 MCG tablet Take 1,000 mcg by mouth daily.   Yes [provider]       Jackson Fetters MD ([place]) FOR [place] [facility]

## 2022-05-03 NOTE — Discharge Summary (Signed)
Physician Discharge Summary  [name]. CWC:[id number] DOB: Sep 16, 1940 DOA: 04/29/2022  PCP: Dola Argyle, MD  Admit date: 04/29/2022 Discharge date: 05/03/2022  Admitted From: Home Disposition:  Home  Discharge Condition:Stable CODE STATUS:FULL Diet recommendation: Heart Healthy  Brief/Interim Summary:  Patient is a 81 year old male with history of hypertension, morbid obesity, renal cell carcinoma status post nephrectomy, neurogenic bladder with chronic Foley catheter, lower extremity weakness ambulates with the help of rollator presents here with complaint of shortness of breath, lower extremity edema, weight gain.  On presentation, he was found to be severely volume overloaded with bilateral lower extremity edema. Started on IV Lasix.  During his hospitalization, he was noticed to have 2nd degree AV block,cardiology consulted.This afternoon,he became severely bradycardiac,cardiology recommending to transfer to [facility] for pacmaker placement.He has been accepted at [facility]  Problems addressed:     Acute on chronic diastolic CHF/Volume overload/anasarca: Presented with weight gain, bilateral lower extremity swelling. Started on IV Lasix.  Echo showed EF of 50 to 76%, grade 1 diastolic dysfunction. Significant improvement in the anasarca.Discontinued  IV Lasix and started on home dose 20 mg twice daily.     2nd  degree AV block: Patient remains bradycardic with heart rate in the range of 40-50.  Asymptomatic.  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  Donepezil discontinued.  TSH normal.This afternoon,he became severely bradycardiac,cardiology recommending to transfer to [facility] for pacmaker placement.He has been accepted at [facility]   Hypertension: Currently blood pressure stable.  antihypertensives on hold   History of dementia: Currently alert oriented.  Donepezil at home, now discontinued   Constipation: Continue bowel regimen   Hyperlipidemia: On  Lipitor   History of BPH/lower urinary tract symptoms/obstruction: Has chronic Foley catheter.  Follows with urology.  Continue Proscar, Myrbetriq, tamsulosin   History of renal cell carcinoma: Status post nephrectomy left side.  Currently kidney function stable.   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   Renal cell carcinoma s/p nephrectomy, left   BPH with obstruction/lower urinary tract symptoms   Essential hypertension   Acute on chronic congestive heart failure (HCC)   Obesity (BMI 30-39.9)   Chronic indwelling Foley catheter   Second degree heart block   Heart block AV complete [facility])    Discharge Instructions  Discharge Instructions     Diet - low sodium heart healthy   Complete by: As directed       Allergies as of 05/03/2022       Reactions   Fentanyl Nausea Only   Oxycodone Nausea And Vomiting        Medication List     STOP taking these medications    donepezil 10 MG tablet Commonly known as: ARICEPT   losartan 25 MG tablet Commonly known as: COZAAR   spironolactone 25 MG tablet Commonly known as: ALDACTONE       TAKE these medications    acetaminophen 325 MG tablet Commonly known as: TYLENOL Take 500 mg by mouth every 6 (six) hours as needed.   aspirin EC 81 MG tablet Take 81 mg by mouth daily as needed.   atorvastatin 40 MG tablet Commonly known as: LIPITOR Take 40 mg by mouth daily.   [employer] Cortisone Maximum Strength 1 % Generic  drug: hydrocortisone cream Apply 1 Application topically as needed for itching.   cyanocobalamin 1000 MCG tablet Commonly known as: VITAMIN B12 Take 1,000 mcg by mouth daily.   diclofenac Sodium 1 % Gel Commonly known as: VOLTAREN Apply 2 g topically 2 (two) times daily as  needed.   finasteride 5 MG tablet Commonly known as: PROSCAR Take 5 mg by mouth daily.   furosemide 20 MG tablet Commonly known as: LASIX Take 20 mg by mouth 2 (two) times daily.   gabapentin 300 MG capsule Commonly known as: NEURONTIN Take 1 capsule (300 mg total) by mouth 2 (two) times daily. What changed:  how much to take when to take this additional instructions   lidocaine 5 % Commonly known as: LIDODERM Place 2 patches onto the skin daily as needed.   mirabegron ER 25 MG Tb24 tablet Commonly known as: MYRBETRIQ Take 25 mg by mouth daily.   omeprazole 20 MG capsule Commonly known as: PRILOSEC Take 1 capsule (20 mg total) by mouth daily.   senna-docusate 8.6-50 MG tablet Commonly known as: Senokot-S Take 1 tablet by mouth 2 (two) times daily. What changed:  when to take this reasons to take this   tamsulosin 0.4 MG Caps capsule Commonly known as: FLOMAX Take 0.4 mg by mouth daily.   traZODone 50 MG tablet Commonly known as: DESYREL Take 50-100 mg by mouth at bedtime.        Allergies  Allergen Reactions   Fentanyl Nausea Only   Oxycodone Nausea And Vomiting    Consultations: Cardiology   Procedures/Studies: ECHOCARDIOGRAM COMPLETE  Result Date: 05/01/2022    ECHOCARDIOGRAM REPORT   Patient Name:   Urban Naval. Date of Exam: 04/30/2022 Medical Rec #:  [id number]          Height:       73.0 in Accession #:    [id number]         Weight:       251.5 lb Date of Birth:  August 05, 1940           BSA:          2.372 m Patient Age:    78 years           BP:           Not listed in chart/Not listed                                                  in chart mmHg Patient Gender: M                  HR:           72 bpm. Exam Location:  [facility] Procedure: 2D Echo, Cardiac Doppler and Color Doppler Indications:     CHF-acute diastolic A63.01  History:         Patient has no prior history of Echocardiogram examinations.                  Stroke; Risk  Factors:Hypertension.  Sonographer:     Sherrie Sport Referring Phys:  [id number] Loletha Grayer Diagnosing Phys: Yolonda Kida MD  Sonographer Comments: Technically challenging study due to limited acoustic windows, no apical window and no subcostal window. IMPRESSIONS  1. Left ventricular ejection fraction, by estimation, is 50 to 55%. The left ventricle has low  normal function. The left ventricle has no regional wall motion abnormalities. There is moderate concentric left ventricular hypertrophy. Left ventricular diastolic parameters are consistent with Grade I diastolic dysfunction (impaired relaxation).  2. Right ventricular systolic function was not well visualized. The right ventricular size is normal. Mildly increased right ventricular wall thickness.  3. Left atrial size was mild to moderately dilated.  4. Right atrial size was mild to moderately dilated.  5. The mitral valve is normal in structure. No evidence of mitral valve regurgitation.  6. The aortic valve is normal in structure. Aortic valve regurgitation is not visualized. FINDINGS  Left Ventricle: Left ventricular ejection fraction, by estimation, is 50 to 55%. The left ventricle has low normal function. The left ventricle has no regional wall motion abnormalities. The left ventricular internal cavity size was normal in size. There is moderate concentric left ventricular hypertrophy. Left ventricular diastolic parameters are consistent with Grade I diastolic dysfunction (impaired relaxation). Right Ventricle: The right ventricular size is normal. Mildly increased right ventricular wall thickness. Right ventricular systolic function was not well visualized. Left Atrium: Left atrial size was mild to moderately dilated. Right Atrium: Right atrial size was mild to moderately dilated. Pericardium: There is no evidence of pericardial effusion. Mitral Valve: The mitral valve is normal in structure. No evidence of mitral valve regurgitation. Tricuspid Valve:  The tricuspid valve is normal in structure. Tricuspid valve regurgitation is not demonstrated. Aortic Valve: The aortic valve is normal in structure. Aortic valve regurgitation is not visualized. Pulmonic Valve: The pulmonic valve was normal in structure. Pulmonic valve regurgitation is not visualized. Aorta: The ascending aorta was not well visualized. IAS/Shunts: No atrial level shunt detected by color flow Doppler.  LEFT VENTRICLE PLAX 2D LVIDd:         4.10 cm LVIDs:         3.00 cm LV PW:         1.50 cm LV IVS:        1.70 cm LVOT diam:     2.20 cm LVOT Area:     3.80 cm  LEFT ATRIUM         Index LA diam:    4.80 cm 2.02 cm/m   AORTA Ao Root diam: 4.20 cm  SHUNTS Systemic Diam: 2.20 cm Yolonda Kida MD Electronically signed by Yolonda Kida MD Signature Date/Time: 05/01/2022/8:37:38 AM    Final    CT Angio Chest PE W and/or Wo Contrast  Result Date: 04/30/2022 CLINICAL DATA:  Increased weight gain and shortness of breath Positive D-dimer EXAM: CT ANGIOGRAPHY CHEST CT ABDOMEN AND PELVIS WITH CONTRAST TECHNIQUE: Multidetector CT imaging of the chest was performed using the standard protocol during bolus administration of intravenous contrast. Multiplanar CT image reconstructions and MIPs were obtained to evaluate the vascular anatomy. Multidetector CT imaging of the abdomen and pelvis was performed using the standard protocol during bolus administration of intravenous contrast. RADIATION DOSE REDUCTION: This exam was performed according to the departmental dose-optimization program which includes automated exposure control, adjustment of the mA and/or kV according to patient size and/or use of iterative reconstruction technique. CONTRAST:  1101m OMNIPAQUE IOHEXOL 350 MG/ML SOLN COMPARISON:  CT abdomen and pelvis 11/21/2021; CT chest abdomen and pelvis 09/07/2021 FINDINGS: CTA CHEST FINDINGS Cardiovascular: Satisfactory opacification of the pulmonary arteries to the segmental level. No evidence of  pulmonary embolism. Normal heart size. Advanced coronary artery calcification. Trace pericardial effusion. Mediastinum/Nodes: Similar prominent mediastinal adenopathy. Thyroid gland, trachea, and esophagus demonstrate no significant  findings. Lungs/Pleura: Expiratory phase imaging. Air trapping bilaterally. Bronchial wall thickening and mucous plugging greatest in the lower lobes. No focal consolidation, pleural effusion, or pneumothorax. Musculoskeletal: No chest wall abnormality. No acute osseous findings. Review of the MIP images confirms the above findings. CT ABDOMEN and PELVIS FINDINGS Hepatobiliary: No acute abnormality. 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 No follow-up recommended. Unremarkable gallbladder. No biliary dilation. Pancreas: Unremarkable. No pancreatic ductal dilatation or surrounding inflammatory changes. Spleen: Normal in size without focal abnormality. Adrenals/Urinary Tract: Normal adrenal glands. Left nephrectomy. Unremarkable right kidney. No urinary calculi or hydronephrosis. Foley catheter in nondistended bladder. Stomach/Bowel: Normal caliber large and small bowel. The appendix is not visualized. Vascular/Lymphatic: Advanced aorto bi-iliac calcified atherosclerosis. No aneurysm. No suspicious lymphadenopathy. Reproductive: Enlarged prostate. Other: No free intraperitoneal fluid or air. 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 Review of the MIP images confirms the above findings. IMPRESSION: 1. Negative for acute pulmonary embolism. 2. Advanced coronary artery and Aortic Atherosclerosis (ICD10-I70.0). 3. Bronchial wall thickening and air trapping compatible with chronic bronchitis/reactive airways. 4. No acute abnormality in the abdomen or pelvis. 5. Enlarged prostate. Electronically Signed   By: Placido Sou M.D.   On: 04/30/2022 00:50   CT ABDOMEN PELVIS W CONTRAST  Result Date: 04/30/2022 CLINICAL DATA:  Increased  weight gain and shortness of breath Positive D-dimer EXAM: CT ANGIOGRAPHY CHEST CT ABDOMEN AND PELVIS WITH CONTRAST TECHNIQUE: Multidetector CT imaging of the chest was performed using the standard protocol during bolus administration of intravenous contrast. Multiplanar CT image reconstructions and MIPs were obtained to evaluate the vascular anatomy. Multidetector CT imaging of the abdomen and pelvis was performed using the standard protocol during bolus administration of intravenous contrast. RADIATION DOSE REDUCTION: This exam was performed according to the departmental dose-optimization program which includes automated exposure control, adjustment of the mA and/or kV according to patient size and/or use of iterative reconstruction technique. CONTRAST:  146m OMNIPAQUE IOHEXOL 350 MG/ML SOLN COMPARISON:  CT abdomen and pelvis 11/21/2021; CT chest abdomen and pelvis 09/07/2021 FINDINGS: CTA CHEST FINDINGS Cardiovascular: Satisfactory opacification of the pulmonary arteries to the segmental level. No evidence of pulmonary embolism. Normal heart size. Advanced coronary artery calcification. Trace pericardial effusion. Mediastinum/Nodes: Similar prominent mediastinal adenopathy. Thyroid gland, trachea, and esophagus demonstrate no significant findings. Lungs/Pleura: Expiratory phase imaging. Air trapping bilaterally. Bronchial wall thickening and mucous plugging greatest in the lower lobes. No focal consolidation, pleural effusion, or pneumothorax. Musculoskeletal: No chest wall abnormality. No acute osseous findings. Review of the MIP images confirms the above findings. CT ABDOMEN and PELVIS FINDINGS Hepatobiliary: No acute abnormality. 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 No follow-up recommended. Unremarkable gallbladder. No biliary dilation. Pancreas: Unremarkable. No pancreatic ductal dilatation or surrounding inflammatory changes. Spleen: Normal in size without focal abnormality. Adrenals/Urinary  Tract: Normal adrenal glands. Left nephrectomy. Unremarkable right kidney. No urinary calculi or hydronephrosis. Foley catheter in nondistended bladder. Stomach/Bowel: Normal caliber large and small bowel. The appendix is not visualized. Vascular/Lymphatic: Advanced aorto bi-iliac calcified atherosclerosis. No aneurysm. No suspicious lymphadenopathy. Reproductive: Enlarged prostate. Other: No free intraperitoneal fluid or air. 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 Review of the MIP images confirms the above findings. IMPRESSION: 1. Negative for acute pulmonary embolism. 2. Advanced coronary artery and Aortic Atherosclerosis (ICD10-I70.0). 3. Bronchial wall thickening and air trapping compatible with chronic bronchitis/reactive airways. 4. No acute abnormality in the abdomen or pelvis. 5. Enlarged prostate. Electronically Signed   By: TCarroll KindsD.  On: 04/30/2022 00:50   US Venous Img Lower Bilateral  Result Date: 04/30/2022 CLINICAL DATA:  Bilateral lower extremity edema EXAM: BILATERAL LOWER EXTREMITY VENOUS DOPPLER ULTRASOUND TECHNIQUE: Gray-scale sonography with compression, as well as color and duplex ultrasound, were performed to evaluate the deep venous system(s) from the level of the common femoral vein through the popliteal and proximal calf veins. COMPARISON:  None Available. FINDINGS: VENOUS Normal compressibility of the common femoral, superficial femoral, and popliteal veins, as well as the visualized calf veins. Visualized portions of profunda femoral vein and great saphenous vein unremarkable. No filling defects to suggest DVT on grayscale or color Doppler imaging. Doppler waveforms show normal direction of venous flow, normal respiratory plasticity and response to augmentation. Limited views of the contralateral common femoral vein are unremarkable. OTHER None. Limitations: none IMPRESSION: Negative. Electronically Signed   By: Ulyses Jarred M.D.   On: 04/30/2022 00:36   US Abdomen Limited  Result Date: 04/30/2022 CLINICAL DATA:  Abdominal distension EXAM: LIMITED ABDOMEN ULTRASOUND FOR ASCITES TECHNIQUE: Limited ultrasound survey for ascites was performed in all four abdominal quadrants. COMPARISON:  None Available. FINDINGS: No ascites visualized. IMPRESSION: No ascites visualized. Electronically Signed   By: Ulyses Jarred M.D.   On: 04/30/2022 00:31   DG Chest 2 View  Result Date: 04/29/2022 CLINICAL DATA:  Shortness of breath EXAM: CHEST - 2 VIEW COMPARISON:  Radiographs 02/11/2022 FINDINGS: Stable cardiomediastinal silhouette. Aortic atherosclerotic calcification. Calcified mediastinal nodes. No focal consolidation, pleural effusion, or pneumothorax. No acute osseous abnormality. Elevated right hemidiaphragm. IMPRESSION: No active cardiopulmonary disease. Electronically Signed   By: Placido Sou M.D.   On: 04/29/2022 17:07      Subjective:   Discharge Exam: Vitals:   05/03/22 1130 05/03/22 1616  BP: 110/72   Pulse: 78   Resp: 17   Temp: 98.3 F (36.8 C)   SpO2: 96% 95%   Vitals:   05/03/22 0420 05/03/22 0822 05/03/22 1130 05/03/22 1616  BP: 130/74 116/68 110/72   Pulse: 67 (!) 56 78   Resp: '16 18 17   '$ Temp: 98.4 F (36.9 C) 97.9 F (36.6 C) 98.3 F (36.8 C)   TempSrc:  Oral Oral   SpO2: 94% 96% 96% 95%  Weight:      Height:        General: Pt is alert, awake, not in acute distress Cardiovascular: RRR, S1/S2 +, no rubs, no gallops Respiratory: CTA bilaterally, no wheezing, no rhonchi Abdominal: Soft, NT, ND, bowel sounds + Extremities: no edema, no cyanosis    The results of significant diagnostics from this hospitalization (including imaging, microbiology, ancillary and laboratory) are listed below for reference.     Microbiology: Recent Results (from the past 240 hour(s))  Urine Culture     Status: Abnormal   Collection Time: 04/30/22  1:47 AM   Specimen: Urine, Random  Result  Value Ref Range Status   Specimen Description   Final    URINE, RANDOM Performed at [facility], [address], [place], [place] [address]    Special Requests   Final    NONE Performed at [facility], [address] [place], [place] [address]    Culture MULTIPLE SPECIES PRESENT, SUGGEST RECOLLECTION (A)  Final   Report Status 05/01/2022 FINAL  Final  MRSA Next Gen by PCR, Nasal     Status: Abnormal   Collection Time: 05/01/22 11:36 AM   Specimen: Nasal Mucosa; Nasal Swab  Result Value Ref Range Status   MRSA  by PCR Next Gen DETECTED (A) NOT DETECTED Final    Comment: RESULT CALLED TO, READ BACK BY AND VERIFIED WITH: SAINABOU TAMBAJANG AT 1411 05/01/22.PMF (NOTE) The GeneXpert MRSA Assay (FDA approved for NASAL specimens only), is one component of a comprehensive MRSA colonization surveillance program. It is not intended to diagnose MRSA infection nor to guide or monitor treatment for MRSA infections. Test performance is not FDA approved in patients less than 2 years old. Performed at [facility], [address] [place], [place] [address]      Labs: BNP (last 3 results) Recent Labs    09/08/21 0110 01/01/22 1720 04/29/22 1642  BNP 85.9 71.9 449.2*   Basic Metabolic Panel: Recent Labs  Lab 04/29/22 1632 04/30/22 1453 05/01/22 0738 05/02/22 0356 05/03/22 0837  NA 136 137 138 137 136  K 3.9 3.7 3.5 3.3* 3.8  CL 102 102 103 102 104  CO2 '25 25 27 25 22  '$ GLUCOSE 96 114* 100* 95 110*  BUN 21 22 24* 25* 28*  CREATININE 1.11 1.47* 1.22 1.33* 1.29*  CALCIUM 9.1 8.7* 8.6* 8.8* 9.0   Liver Function Tests: Recent Labs  Lab 04/29/22 2230  AST 27  ALT 20  ALKPHOS 79  BILITOT 1.2  PROT 8.3*  ALBUMIN 4.2   No results for input(s): "LIPASE", "AMYLASE" in the last 168 hours. No results for input(s): "AMMONIA" in the last 168 hours. CBC: Recent Labs  Lab 04/29/22 1632 05/01/22 0738  WBC 8.6 6.8  HGB 12.5* 12.1*  HCT 41.1 36.8*   MCV 86.3 82.0  PLT 216 218   Cardiac Enzymes: No results for input(s): "CKTOTAL", "CKMB", "CKMBINDEX", "TROPONINI" in the last 168 hours. BNP: Invalid input(s): "POCBNP" CBG: No results for input(s): "GLUCAP" in the last 168 hours. D-Dimer No results for input(s): "DDIMER" in the last 72 hours. Hgb A1c No results for input(s): "HGBA1C" in the last 72 hours. Lipid Profile No results for input(s): "CHOL", "HDL", "LDLCALC", "TRIG", "CHOLHDL", "LDLDIRECT" in the last 72 hours. Thyroid function studies Recent Labs    05/02/22 0354  TSH 2.382   Anemia work up No results for input(s): "VITAMINB12", "FOLATE", "FERRITIN", "TIBC", "IRON", "RETICCTPCT" in the last 72 hours. Urinalysis    Component Value Date/Time   COLORURINE YELLOW (A) 02/11/2022 1034   APPEARANCEUR HAZY (A) 02/11/2022 1034   LABSPEC 1.017 02/11/2022 1034   PHURINE 5.0 02/11/2022 1034   GLUCOSEU NEGATIVE 02/11/2022 1034   HGBUR SMALL (A) 02/11/2022 1034   BILIRUBINUR NEGATIVE 02/11/2022 1034   KETONESUR NEGATIVE 02/11/2022 1034   PROTEINUR 30 (A) 02/11/2022 1034   NITRITE NEGATIVE 02/11/2022 1034   LEUKOCYTESUR MODERATE (A) 02/11/2022 1034   Sepsis Labs Recent Labs  Lab 04/29/22 1632 05/01/22 0738  WBC 8.6 6.8   Microbiology Recent Results (from the past 240 hour(s))  Urine Culture     Status: Abnormal   Collection Time: 04/30/22  1:47 AM   Specimen: Urine, Random  Result Value Ref Range Status   Specimen Description   Final    URINE, RANDOM Performed at [facility], [address] [place], [place] [address]    Special Requests   Final    NONE Performed at [facility], [address] [place], [place] [address]    Culture MULTIPLE SPECIES PRESENT, SUGGEST RECOLLECTION (A)  Final   Report Status 05/01/2022 FINAL  Final  MRSA Next Gen by PCR, Nasal     Status: Abnormal   Collection Time: 05/01/22 11:36 AM  Specimen: Nasal Mucosa; Nasal Swab  Result Value Ref Range Status    MRSA by PCR Next Gen DETECTED (A) NOT DETECTED Final    Comment: RESULT CALLED TO, READ BACK BY AND VERIFIED WITH: SAINABOU TAMBAJANG AT 1411 05/01/22.PMF (NOTE) The GeneXpert MRSA Assay (FDA approved for NASAL specimens only), is one component of a comprehensive MRSA colonization surveillance program. It is not intended to diagnose MRSA infection nor to guide or monitor treatment for MRSA infections. Test performance is not FDA approved in patients less than 44 years old. Performed at [facility], [address], [place], [place] [address]     Please note: You were cared for by a hospitalist during your hospital stay. Once you are discharged, your primary care physician will handle any further medical issues. Please note that NO REFILLS for any discharge medications will be authorized once you are discharged, as it is imperative that you return to your primary care physician (or establish a relationship with a primary care physician if you do not have one) for your post hospital discharge needs so that they can reassess your need for medications and monitor your lab values.    Time coordinating discharge: 40 minutes  SIGNED:   Shelly Coss, MD  [facility] 05/03/2022, 4:21 PM Pager [phone]  If 7PM-7AM, please contact night-coverage [email or web address] Physician Discharge Summary  [name]. 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   Renal cell carcinoma s/p nephrectomy,  left   BPH with obstruction/lower urinary tract symptoms   Essential hypertension   Acute on chronic congestive heart failure (HCC)   Obesity (BMI 30-39.9)   Chronic indwelling Foley catheter   Second degree heart block   Heart block AV complete [facility])    Discharge Instructions  Discharge Instructions     Diet - low sodium heart healthy   Complete by: As directed       Allergies as of 05/03/2022       Reactions   Fentanyl Nausea Only   Oxycodone Nausea And Vomiting        Medication List     STOP taking these medications    donepezil 10 MG tablet Commonly known as: ARICEPT   losartan 25 MG tablet Commonly known as: COZAAR   spironolactone 25 MG tablet Commonly known as: ALDACTONE       TAKE these medications    acetaminophen 325 MG tablet Commonly known as: TYLENOL Take 500 mg by mouth every 6 (six) hours as needed.   aspirin EC 81 MG tablet Take 81 mg by mouth daily as needed.   atorvastatin 40 MG tablet Commonly known as: LIPITOR Take 40 mg by mouth daily.   [facility] Cortisone Maximum Strength 1 % Generic drug: hydrocortisone cream Apply 1 Application topically as needed for itching.   cyanocobalamin 1000 MCG tablet Commonly known as: VITAMIN B12 Take 1,000 mcg by mouth daily.   diclofenac Sodium 1 % Gel Commonly known as: VOLTAREN Apply 2 g topically 2 (two) times daily as needed.   finasteride 5 MG tablet Commonly known as: PROSCAR  Take 5 mg by mouth daily.   furosemide 20 MG tablet Commonly known as: LASIX Take 20 mg by mouth 2 (two) times daily.   gabapentin 300 MG capsule Commonly known as: NEURONTIN Take 1 capsule (300 mg total) by mouth 2 (two) times daily. What changed:  how much to take when to take this additional instructions   lidocaine 5 % Commonly known as: LIDODERM Place 2 patches onto the skin daily as needed.   mirabegron ER 25 MG Tb24 tablet Commonly known as: MYRBETRIQ Take 25 mg by mouth daily.   omeprazole  20 MG capsule Commonly known as: PRILOSEC Take 1 capsule (20 mg total) by mouth daily.   senna-docusate 8.6-50 MG tablet Commonly known as: Senokot-S Take 1 tablet by mouth 2 (two) times daily. What changed:  when to take this reasons to take this   tamsulosin 0.4 MG Caps capsule Commonly known as: FLOMAX Take 0.4 mg by mouth daily.   traZODone 50 MG tablet Commonly known as: DESYREL Take 50-100 mg by mouth at bedtime.        Allergies  Allergen Reactions   Fentanyl Nausea Only   Oxycodone Nausea And Vomiting    Consultations:    Procedures/Studies: ECHOCARDIOGRAM COMPLETE  Result Date: 05/01/2022    ECHOCARDIOGRAM REPORT   Patient Name:   Jamarques Pinedo. Date of Exam: 04/30/2022 Medical Rec #:  [id number]          Height:       73.0 in Accession #:    [id number]         Weight:       251.5 lb Date of Birth:  18-Sep-1940           BSA:          2.372 m Patient Age:    16 years           BP:           Not listed in chart/Not listed                                                  in chart mmHg Patient Gender: M                  HR:           72 bpm. Exam Location:  [facility] Procedure: 2D Echo, Cardiac Doppler and Color Doppler Indications:     CHF-acute diastolic J67.34  History:         Patient has no prior history of Echocardiogram examinations.                  Stroke; Risk Factors:Hypertension.  Sonographer:     Sherrie Sport Referring Phys:  [id number] Loletha Grayer Diagnosing Phys: Yolonda Kida MD  Sonographer Comments: Technically challenging study due to limited acoustic windows, no apical window and no subcostal window. IMPRESSIONS  1. Left ventricular ejection fraction, by estimation, is 50 to 55%. The left ventricle has low normal function. The left ventricle has no regional wall motion abnormalities. There is moderate concentric left ventricular hypertrophy. Left ventricular diastolic parameters are consistent with Grade I diastolic dysfunction (impaired relaxation).  2.  Right ventricular systolic function was not well visualized. The right ventricular size is normal. Mildly increased right ventricular wall thickness.  3. Left atrial size was  mild to moderately dilated.  4. Right atrial size was mild to moderately dilated.  5. The mitral valve is normal in structure. No evidence of mitral valve regurgitation.  6. The aortic valve is normal in structure. Aortic valve regurgitation is not visualized. FINDINGS  Left Ventricle: Left ventricular ejection fraction, by estimation, is 50 to 55%. The left ventricle has low normal function. The left ventricle has no regional wall motion abnormalities. The left ventricular internal cavity size was normal in size. There is moderate concentric left ventricular hypertrophy. Left ventricular diastolic parameters are consistent with Grade I diastolic dysfunction (impaired relaxation). Right Ventricle: The right ventricular size is normal. Mildly increased right ventricular wall thickness. Right ventricular systolic function was not well visualized. Left Atrium: Left atrial size was mild to moderately dilated. Right Atrium: Right atrial size was mild to moderately dilated. Pericardium: There is no evidence of pericardial effusion. Mitral Valve: The mitral valve is normal in structure. No evidence of mitral valve regurgitation. Tricuspid Valve: The tricuspid valve is normal in structure. Tricuspid valve regurgitation is not demonstrated. Aortic Valve: The aortic valve is normal in structure. Aortic valve regurgitation is not visualized. Pulmonic Valve: The pulmonic valve was normal in structure. Pulmonic valve regurgitation is not visualized. Aorta: The ascending aorta was not well visualized. IAS/Shunts: No atrial level shunt detected by color flow Doppler.  LEFT VENTRICLE PLAX 2D LVIDd:         4.10 cm LVIDs:         3.00 cm LV PW:         1.50 cm LV IVS:        1.70 cm LVOT diam:     2.20 cm LVOT Area:     3.80 cm  LEFT ATRIUM         Index LA  diam:    4.80 cm 2.02 cm/m   AORTA Ao Root diam: 4.20 cm  SHUNTS Systemic Diam: 2.20 cm Yolonda Kida MD Electronically signed by Yolonda Kida MD Signature Date/Time: 05/01/2022/8:37:38 AM    Final    CT Angio Chest PE W and/or Wo Contrast  Result Date: 04/30/2022 CLINICAL DATA:  Increased weight gain and shortness of breath Positive D-dimer EXAM: CT ANGIOGRAPHY CHEST CT ABDOMEN AND PELVIS WITH CONTRAST TECHNIQUE: Multidetector CT imaging of the chest was performed using the standard protocol during bolus administration of intravenous contrast. Multiplanar CT image reconstructions and MIPs were obtained to evaluate the vascular anatomy. Multidetector CT imaging of the abdomen and pelvis was performed using the standard protocol during bolus administration of intravenous contrast. RADIATION DOSE REDUCTION: This exam was performed according to the departmental dose-optimization program which includes automated exposure control, adjustment of the mA and/or kV according to patient size and/or use of iterative reconstruction technique. CONTRAST:  166m OMNIPAQUE IOHEXOL 350 MG/ML SOLN COMPARISON:  CT abdomen and pelvis 11/21/2021; CT chest abdomen and pelvis 09/07/2021 FINDINGS: CTA CHEST FINDINGS Cardiovascular: Satisfactory opacification of the pulmonary arteries to the segmental level. No evidence of pulmonary embolism. Normal heart size. Advanced coronary artery calcification. Trace pericardial effusion. Mediastinum/Nodes: Similar prominent mediastinal adenopathy. Thyroid gland, trachea, and esophagus demonstrate no significant findings. Lungs/Pleura: Expiratory phase imaging. Air trapping bilaterally. Bronchial wall thickening and mucous plugging greatest in the lower lobes. No focal consolidation, pleural effusion, or pneumothorax. Musculoskeletal: No chest wall abnormality. No acute osseous findings. Review of the MIP images confirms the above findings. CT ABDOMEN and PELVIS FINDINGS Hepatobiliary:  No acute abnormality. 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 No follow-up recommended. Unremarkable gallbladder. No biliary dilation. Pancreas: Unremarkable. No pancreatic ductal dilatation or surrounding inflammatory changes. Spleen: Normal in size without focal abnormality. Adrenals/Urinary Tract: Normal adrenal glands. Left nephrectomy. Unremarkable right kidney. No urinary calculi or hydronephrosis. Foley catheter in nondistended bladder. Stomach/Bowel: Normal caliber large and small bowel. The appendix is not visualized. Vascular/Lymphatic: Advanced aorto bi-iliac calcified atherosclerosis. No aneurysm. No suspicious lymphadenopathy. Reproductive: Enlarged prostate. Other: No free intraperitoneal fluid or air. 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 Review of the MIP images confirms the above findings. IMPRESSION: 1. Negative for acute pulmonary embolism. 2. Advanced coronary artery and Aortic Atherosclerosis (ICD10-I70.0). 3. Bronchial wall thickening and air trapping compatible with chronic bronchitis/reactive airways. 4. No acute abnormality in the abdomen or pelvis. 5. Enlarged prostate. Electronically Signed   By: Placido Sou M.D.   On: 04/30/2022 00:50   CT ABDOMEN PELVIS W CONTRAST  Result Date: 04/30/2022 CLINICAL DATA:  Increased weight gain and shortness of breath Positive D-dimer EXAM: CT ANGIOGRAPHY CHEST CT ABDOMEN AND PELVIS WITH CONTRAST TECHNIQUE: Multidetector CT imaging of the chest was performed using the standard protocol during bolus administration of intravenous contrast. Multiplanar CT image reconstructions and MIPs were obtained to evaluate the vascular anatomy. Multidetector CT imaging of the abdomen and pelvis was performed using the standard protocol during bolus administration of intravenous contrast. RADIATION DOSE REDUCTION: This exam was performed according to the departmental dose-optimization program which  includes automated exposure control, adjustment of the mA and/or kV according to patient size and/or use of iterative reconstruction technique. CONTRAST:  18m OMNIPAQUE IOHEXOL 350 MG/ML SOLN COMPARISON:  CT abdomen and pelvis 11/21/2021; CT chest abdomen and pelvis 09/07/2021 FINDINGS: CTA CHEST FINDINGS Cardiovascular: Satisfactory opacification of the pulmonary arteries to the segmental level. No evidence of pulmonary embolism. Normal heart size. Advanced coronary artery calcification. Trace pericardial effusion. Mediastinum/Nodes: Similar prominent mediastinal adenopathy. Thyroid gland, trachea, and esophagus demonstrate no significant findings. Lungs/Pleura: Expiratory phase imaging. Air trapping bilaterally. Bronchial wall thickening and mucous plugging greatest in the lower lobes. No focal consolidation, pleural effusion, or pneumothorax. Musculoskeletal: No chest wall abnormality. No acute osseous findings. Review of the MIP images confirms the above findings. CT ABDOMEN and PELVIS FINDINGS Hepatobiliary: No acute abnormality. 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 No follow-up recommended. Unremarkable gallbladder. No biliary dilation. Pancreas: Unremarkable. No pancreatic ductal dilatation or surrounding inflammatory changes. Spleen: Normal in size without focal abnormality. Adrenals/Urinary Tract: Normal adrenal glands. Left nephrectomy. Unremarkable right kidney. No urinary calculi or hydronephrosis. Foley catheter in nondistended bladder. Stomach/Bowel: Normal caliber large and small bowel. The appendix is not visualized. Vascular/Lymphatic: Advanced aorto bi-iliac calcified atherosclerosis. No aneurysm. No suspicious lymphadenopathy. Reproductive: Enlarged prostate. Other: No free intraperitoneal fluid or air. 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 Review of the MIP images confirms the above findings. IMPRESSION: 1. Negative for acute  pulmonary embolism. 2. Advanced coronary artery and Aortic Atherosclerosis (ICD10-I70.0). 3. Bronchial wall thickening and air trapping compatible with chronic bronchitis/reactive airways. 4. No acute abnormality in the abdomen or pelvis. 5. Enlarged prostate. Electronically Signed   By: TPlacido SouM.D.   On: 04/30/2022 00:50   UKoreaVenous Img Lower Bilateral  Result Date: 04/30/2022 CLINICAL DATA:  Bilateral lower extremity edema EXAM: BILATERAL LOWER EXTREMITY VENOUS DOPPLER ULTRASOUND TECHNIQUE: Gray-scale sonography with compression, as well as color and duplex ultrasound, were performed to evaluate the deep venous system(s) from the level of the common femoral vein through  the popliteal and proximal calf veins. COMPARISON:  None Available. FINDINGS: VENOUS Normal compressibility of the common femoral, superficial femoral, and popliteal veins, as well as the visualized calf veins. Visualized portions of profunda femoral vein and great saphenous vein unremarkable. No filling defects to suggest DVT on grayscale or color Doppler imaging. Doppler waveforms show normal direction of venous flow, normal respiratory plasticity and response to augmentation. Limited views of the contralateral common femoral vein are unremarkable. OTHER None. Limitations: none IMPRESSION: Negative. Electronically Signed   By: Ulyses Jarred M.D.   On: 04/30/2022 00:36   US Abdomen Limited  Result Date: 04/30/2022 CLINICAL DATA:  Abdominal distension EXAM: LIMITED ABDOMEN ULTRASOUND FOR ASCITES TECHNIQUE: Limited ultrasound survey for ascites was performed in all four abdominal quadrants. COMPARISON:  None Available. FINDINGS: No ascites visualized. IMPRESSION: No ascites visualized. Electronically Signed   By: Ulyses Jarred M.D.   On: 04/30/2022 00:31   DG Chest 2 View  Result Date: 04/29/2022 CLINICAL DATA:  Shortness of breath EXAM: CHEST - 2 VIEW COMPARISON:  Radiographs 02/11/2022 FINDINGS: Stable cardiomediastinal  silhouette. Aortic atherosclerotic calcification. Calcified mediastinal nodes. No focal consolidation, pleural effusion, or pneumothorax. No acute osseous abnormality. Elevated right hemidiaphragm. IMPRESSION: No active cardiopulmonary disease. Electronically Signed   By: Placido Sou M.D.   On: 04/29/2022 17:07      Subjective:   Discharge Exam: Vitals:   05/03/22 1130 05/03/22 1616  BP: 110/72   Pulse: 78   Resp: 17   Temp: 98.3 F (36.8 C)   SpO2: 96% 95%   Vitals:   05/03/22 0420 05/03/22 0822 05/03/22 1130 05/03/22 1616  BP: 130/74 116/68 110/72   Pulse: 67 (!) 56 78   Resp: '16 18 17   '$ Temp: 98.4 F (36.9 C) 97.9 F (36.6 C) 98.3 F (36.8 C)   TempSrc:  Oral Oral   SpO2: 94% 96% 96% 95%  Weight:      Height:        General: Pt is alert, awake, not in acute distress Cardiovascular: RRR, S1/S2 +, no rubs, no gallops Respiratory: CTA bilaterally, no wheezing, no rhonchi Abdominal: Soft, NT, ND, bowel sounds + Extremities: no edema, no cyanosis    The results of significant diagnostics from this hospitalization (including imaging, microbiology, ancillary and laboratory) are listed below for reference.     Microbiology: Recent Results (from the past 240 hour(s))  Urine Culture     Status: Abnormal   Collection Time: 04/30/22  1:47 AM   Specimen: Urine, Random  Result Value Ref Range Status   Specimen Description   Final    URINE, RANDOM Performed at [facility], [address], [place], [place] [address]    Special Requests   Final    NONE Performed at [facility], [address] [place], [place] [address]    Culture MULTIPLE SPECIES PRESENT, SUGGEST RECOLLECTION (A)  Final   Report Status 05/01/2022 FINAL  Final  MRSA Next Gen by PCR, Nasal     Status: Abnormal   Collection Time: 05/01/22 11:36 AM   Specimen: Nasal Mucosa; Nasal Swab  Result Value Ref Range Status   MRSA by PCR Next Gen DETECTED (A) NOT DETECTED Final     Comment: RESULT CALLED TO, READ BACK BY AND VERIFIED WITH: SAINABOU TAMBAJANG AT 1411 05/01/22.PMF (NOTE) The GeneXpert MRSA Assay (FDA approved for NASAL specimens only), is one component of a comprehensive MRSA colonization surveillance program. It is not intended to diagnose MRSA infection nor  to guide or monitor treatment for MRSA infections. Test performance is not FDA approved in patients less than 60 years old. Performed at [facility], [address] [place], [place] [address]      Labs: BNP (last 3 results) Recent Labs    09/08/21 0110 01/01/22 1720 04/29/22 1642  BNP 85.9 71.9 604.5*   Basic Metabolic Panel: Recent Labs  Lab 04/29/22 1632 04/30/22 1453 05/01/22 0738 05/02/22 0356 05/03/22 0837  NA 136 137 138 137 136  K 3.9 3.7 3.5 3.3* 3.8  CL 102 102 103 102 104  CO2 '25 25 27 25 22  '$ GLUCOSE 96 114* 100* 95 110*  BUN 21 22 24* 25* 28*  CREATININE 1.11 1.47* 1.22 1.33* 1.29*  CALCIUM 9.1 8.7* 8.6* 8.8* 9.0   Liver Function Tests: Recent Labs  Lab 04/29/22 2230  AST 27  ALT 20  ALKPHOS 79  BILITOT 1.2  PROT 8.3*  ALBUMIN 4.2   No results for input(s): "LIPASE", "AMYLASE" in the last 168 hours. No results for input(s): "AMMONIA" in the last 168 hours. CBC: Recent Labs  Lab 04/29/22 1632 05/01/22 0738  WBC 8.6 6.8  HGB 12.5* 12.1*  HCT 41.1 36.8*  MCV 86.3 82.0  PLT 216 218   Cardiac Enzymes: No results for input(s): "CKTOTAL", "CKMB", "CKMBINDEX", "TROPONINI" in the last 168 hours. BNP: Invalid input(s): "POCBNP" CBG: No results for input(s): "GLUCAP" in the last 168 hours. D-Dimer No results for input(s): "DDIMER" in the last 72 hours. Hgb A1c No results for input(s): "HGBA1C" in the last 72 hours. Lipid Profile No results for input(s): "CHOL", "HDL", "LDLCALC", "TRIG", "CHOLHDL", "LDLDIRECT" in the last 72 hours. Thyroid function studies Recent Labs    05/02/22 0354  TSH 2.382   Anemia work up No results for  input(s): "VITAMINB12", "FOLATE", "FERRITIN", "TIBC", "IRON", "RETICCTPCT" in the last 72 hours. Urinalysis    Component Value Date/Time   COLORURINE YELLOW (A) 02/11/2022 1034   APPEARANCEUR HAZY (A) 02/11/2022 1034   LABSPEC 1.017 02/11/2022 1034   PHURINE 5.0 02/11/2022 1034   GLUCOSEU NEGATIVE 02/11/2022 1034   HGBUR SMALL (A) 02/11/2022 1034   BILIRUBINUR NEGATIVE 02/11/2022 1034   KETONESUR NEGATIVE 02/11/2022 1034   PROTEINUR 30 (A) 02/11/2022 1034   NITRITE NEGATIVE 02/11/2022 1034   LEUKOCYTESUR MODERATE (A) 02/11/2022 1034   Sepsis Labs Recent Labs  Lab 04/29/22 1632 05/01/22 0738  WBC 8.6 6.8   Microbiology Recent Results (from the past 240 hour(s))  Urine Culture     Status: Abnormal   Collection Time: 04/30/22  1:47 AM   Specimen: Urine, Random  Result Value Ref Range Status   Specimen Description   Final    URINE, RANDOM Performed at [facility], [address], [place], [place] [address]    Special Requests   Final    NONE Performed at [facility], [address] [place], [place] [address]    Culture MULTIPLE SPECIES PRESENT, SUGGEST RECOLLECTION (A)  Final   Report Status 05/01/2022 FINAL  Final  MRSA Next Gen by PCR, Nasal     Status: Abnormal   Collection Time: 05/01/22 11:36 AM   Specimen: Nasal Mucosa; Nasal Swab  Result Value Ref Range Status   MRSA by PCR Next Gen DETECTED (A) NOT DETECTED Final    Comment: RESULT CALLED TO, READ BACK BY AND VERIFIED WITH: SAINABOU TAMBAJANG AT 1411 05/01/22.PMF (NOTE) The GeneXpert MRSA Assay (FDA approved for NASAL specimens only), is one component of a  comprehensive MRSA colonization surveillance program. It is not intended to diagnose MRSA infection nor to guide or monitor treatment for MRSA infections. Test performance is not FDA approved in patients less than 22 years old. Performed at [facility], [address] [place], [place] [address]     Please note: You were  cared for by a hospitalist during your hospital stay. Once you are discharged, your primary care physician will handle any further medical issues. Please note that NO REFILLS for any discharge medications will be authorized once you are discharged, as it is imperative that you return to your primary care physician (or establish a relationship with a primary care physician if you do not have one) for your post hospital discharge needs so that they can reassess your need for medications and monitor your lab values.    Time coordinating discharge: 40 minutes  SIGNED:   Shelly Coss, MD  [facility] 05/03/2022, 4:21 PM Pager [phone]  If 7PM-7AM, please contact night-coverage [email or web address] Password [email or web address]

## 2022-05-03 NOTE — Progress Notes (Signed)
Patient alert and oriented, with temporary placer in place, on room air with stable pressures. Femoral site clean, dry, intact. Pulses 2+ radial,1+ Distal. Patient is resting in bed, anxious and complaining of discomfort. MD made aware.   Report given to [facility]link transportation.  Daughter and niece updated via phone per patient request.  During handoff monitor showed changes in ST elevation. EKG obtained with night shift RN. Night shift to follow up with MDs.

## 2022-05-03 NOTE — Progress Notes (Addendum)
Patient departed with [facility] at 9:10 P.M. EMTALA complete. All belongings sent with patient. Report given to [employer] earlier at [facility] in [employer]. Updates provided via telephone at 9:19 P.M. Patient's daughter, [name], notified of transfer and given room information by this RN at 9:22 P.M.

## 2022-05-04 ENCOUNTER — Encounter: Payer: Self-pay | Admitting: Internal Medicine

## 2022-05-04 NOTE — Progress Notes (Signed)
During patient rounding, patient complained of not feeling well.  Complaining of dizziness, worsening SOB, and nausea.  Reports he feels like he was going to pass out.   Assessed vital signs.  Noted HR to be sustaining in the 40s.     MD Messaged.  Cardiologist consulted.  EKG collected.   Patient going for a temporary pacemaker and then getting transferred to ICU

## 2022-05-07 NOTE — Progress Notes (Deleted)
   Patient ID: Adam Rios., male    DOB: 01-Aug-1940, 81 y.o.   MRN: [id number]  HPI  Mr Rhines is a 81 y/o male with a history of  Echo report from 05/04/22 showed an EF of 55% along with mild LAE and additional right ventricle findings: there is a device lead present. Echo report from 04/30/22 showed an EF of 50-55% along with moderate LVH and mild/ moderate LAE/RAE.   Admitted 05/03/22 due to chronotropic incompetence and CHB noted. 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 Discharged after 2 days. Admitted 04/29/22 due to shortness of breath, lower extremity edema, weight gain. Started on IV lasix. 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 Transferred to [facility] for pacemaker placement. Was in the ED 04/06/22 but LWBS.    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

## 2022-05-08 ENCOUNTER — Encounter: Payer: Medicare Other | Admitting: Family

## 2022-06-06 ENCOUNTER — Other Ambulatory Visit: Payer: Self-pay

## 2022-06-06 ENCOUNTER — Emergency Department
Admission: EM | Admit: 2022-06-06 | Discharge: 2022-06-06 | Disposition: A | Discharge status: Home / Self Care | Payer: Medicare Other | Attending: Emergency Medicine | Admitting: Emergency Medicine

## 2022-06-06 DIAGNOSIS — I509 Heart failure, unspecified: Secondary | ICD-10-CM | POA: Diagnosis not present

## 2022-06-06 DIAGNOSIS — M79662 Pain in left lower leg: Secondary | ICD-10-CM | POA: Insufficient documentation

## 2022-06-06 DIAGNOSIS — N3090 Cystitis, unspecified without hematuria: Secondary | ICD-10-CM | POA: Insufficient documentation

## 2022-06-06 DIAGNOSIS — R609 Edema, unspecified: Secondary | ICD-10-CM | POA: Diagnosis not present

## 2022-06-06 DIAGNOSIS — N189 Chronic kidney disease, unspecified: Secondary | ICD-10-CM | POA: Diagnosis not present

## 2022-06-06 DIAGNOSIS — I129 Hypertensive chronic kidney disease with stage 1 through stage 4 chronic kidney disease, or unspecified chronic kidney disease: Secondary | ICD-10-CM | POA: Insufficient documentation

## 2022-06-06 DIAGNOSIS — M79661 Pain in right lower leg: Secondary | ICD-10-CM | POA: Diagnosis present

## 2022-06-06 LAB — BASIC METABOLIC PANEL
Anion gap: 9 (ref 5–15)
BUN: 24 mg/dL — ABNORMAL HIGH (ref 8–23)
CO2: 27 mmol/L (ref 22–32)
Calcium: 8.9 mg/dL (ref 8.9–10.3)
Chloride: 101 mmol/L (ref 98–111)
Creatinine, Ser: 1.28 mg/dL — ABNORMAL HIGH (ref 0.61–1.24)
GFR, Estimated: 56 mL/min — ABNORMAL LOW (ref >60)
Glucose, Bld: 102 mg/dL — ABNORMAL HIGH (ref 70–99)
Potassium: 3.5 mmol/L (ref 3.5–5.1)
Sodium: 137 mmol/L (ref 135–145)

## 2022-06-06 LAB — URINALYSIS, ROUTINE W REFLEX MICROSCOPIC
Bilirubin Urine: NEGATIVE
Glucose, UA: NEGATIVE mg/dL
Ketones, ur: NEGATIVE mg/dL
Nitrite: POSITIVE — AB
Protein, ur: NEGATIVE mg/dL
Specific Gravity, Urine: 1.013 (ref 1.005–1.030)
pH: 5 (ref 5.0–8.0)

## 2022-06-06 LAB — CBC
HCT: 42.5 % (ref 39.0–52.0)
Hemoglobin: 13.2 g/dL (ref 13.0–17.0)
MCH: 26.7 pg (ref 26.0–34.0)
MCHC: 31.1 g/dL (ref 30.0–36.0)
MCV: 86 fL (ref 80.0–100.0)
Platelets: 221 10*3/uL (ref 150–400)
RBC: 4.94 MIL/uL (ref 4.22–5.81)
RDW: 14.9 % (ref 11.5–15.5)
WBC: 9 10*3/uL (ref 4.0–10.5)
nRBC: 0 % (ref 0.0–0.2)

## 2022-06-06 LAB — TROPONIN I (HIGH SENSITIVITY)
Troponin I (High Sensitivity): 34 ng/L — ABNORMAL HIGH (ref <18)
Troponin I (High Sensitivity): 34 ng/L — ABNORMAL HIGH (ref <18)

## 2022-06-06 MED ORDER — FUROSEMIDE 20 MG PO TABS: 40.0000 mg | ORAL_TABLET | Freq: Two times a day (BID) | ORAL | 0 refills | Status: DC

## 2022-06-06 MED ORDER — FUROSEMIDE 10 MG/ML IJ SOLN
40.0000 mg | Freq: Once | INTRAMUSCULAR | Status: AC
  Administered 2022-06-06: 40 mg via INTRAVENOUS
  Filled 2022-06-06: qty 4

## 2022-06-06 MED ORDER — POTASSIUM CHLORIDE CRYS ER 20 MEQ PO TBCR: 20.0000 meq | EXTENDED_RELEASE_TABLET | Freq: Two times a day (BID) | ORAL | 0 refills | Status: DC

## 2022-06-06 MED ORDER — CEFDINIR 300 MG PO CAPS: 300.0000 mg | ORAL_CAPSULE | Freq: Two times a day (BID) | ORAL | 0 refills | Status: DC

## 2022-06-06 MED ORDER — SODIUM CHLORIDE 0.9 % IV SOLN
1.0000 g | Freq: Once | INTRAVENOUS | Status: AC
  Administered 2022-06-06: 1 g via INTRAVENOUS
  Filled 2022-06-06: qty 10

## 2022-06-06 MED ORDER — FOLEY CATHETER 2-WAY MISC: 1.0000 | Freq: Once | 0 refills | Status: AC

## 2022-06-06 NOTE — ED Notes (Signed)
Pt called dtr for transport home

## 2022-06-06 NOTE — ED Triage Notes (Signed)
Pt arrives via EMS from home for numbness and pain in the L leg after he started walking on it about 2 hours ago- pt also having swelling in both legs- pt had a recent pace maker placed- pt has hx of CHF- pt VSS per EMS- pt also complaining of abd distention

## 2022-06-06 NOTE — Discharge Instructions (Signed)
Your lab tests and examination were all okay today.  You do have extra fluid on your body, which can be improved by increasing Lasix to 40 mg 2 times a day for the next 3 days.  Take a potassium supplement as well to avoid low potassium.  The heart failure clinic will call you to arrange follow-up early this week.  The urine test does show a bladder infection.  Take the antibiotic cefdinir as prescribed, 2 times a day for a week.  You should have your Foley catheter changed this week, Tuesday or later.

## 2022-06-06 NOTE — ED Provider Notes (Signed)
[facility] Provider Note    Event Date/Time   First MD Initiated Contact with Patient 06/06/22 1730     (approximate)   History   Chief Complaint: Leg Pain   HPI  Adam Rios. is a 82 y.o. male with a history of CKD, hypertension, GERD, neurogenic bladder, RCC, prior stroke who comes ED complaining of left leg numbness since waking up this morning, along with increased swelling in both lower legs.  Reports that he has gained about 10 pounds above his usual weight within the last week.  Denies chest pain or shortness of breath.  No dyspnea on exertion, no orthopnea or PND.  No cough or wheezing.  No fever.  Reports that his doctor told him to increase his Lasix to 40 mg, but he has not made the change yet with his pillbox.     Physical Exam   Triage Vital Signs: ED Triage Vitals  Enc Vitals Group     BP 06/06/22 1613 115/82     Pulse Rate 06/06/22 1613 (!) 117     Resp 06/06/22 1613 18     Temp 06/06/22 1613 97.8 F (36.6 C)     Temp Source 06/06/22 1613 Oral     SpO2 06/06/22 1613 98 %     Weight 06/06/22 1616 233 lb (105.7 kg)     Height 06/06/22 1616 '6\' 1"'$  (1.854 m)     Head Circumference --      Peak Flow --      Pain Score 06/06/22 1615 9     Pain Loc --      Pain Edu? --      Excl. in [place]? --     Most recent vital signs: Vitals:   06/06/22 1613  BP: 115/82  Pulse: (!) 117  Resp: 18  Temp: 97.8 F (36.6 C)  SpO2: 98%    General: Awake, no distress.  CV:  Good peripheral perfusion.  tachycardia heart rate 110.  Normal distal pulses Resp:  Normal effort.  Clear to auscultation bilaterally Abd:  No distention.  Soft with suprapubic tenderness.  Foley catheter in place, last changed December 19. Other:  3+ pitting edema bilateral lower extremities, symmetric calf circumference.   ED Results / Procedures / Treatments   Labs (all labs ordered are listed, but only abnormal results are displayed) Labs Reviewed  BASIC  METABOLIC PANEL - Abnormal; Notable for the following components:      Result Value   Glucose, Bld 102 (*)    BUN 24 (*)    Creatinine, Ser 1.28 (*)    GFR, Estimated 56 (*)    All other components within normal limits  URINALYSIS, ROUTINE W REFLEX MICROSCOPIC - Abnormal; Notable for the following components:   Color, Urine YELLOW (*)    APPearance HAZY (*)    Hgb urine dipstick MODERATE (*)    Nitrite POSITIVE (*)    Leukocytes,Ua LARGE (*)    Bacteria, UA MANY (*)    All other components within normal limits  TROPONIN I (HIGH SENSITIVITY) - Abnormal; Notable for the following components:   Troponin I (High Sensitivity) 34 (*)    All other components within normal limits  TROPONIN I (HIGH SENSITIVITY) - Abnormal; Notable for the following components:   Troponin I (High Sensitivity) 34 (*)    All other components within normal limits  URINE CULTURE  CBC     EKG Interpreted by me Atrial sensed ventricular paced rhythm,  rate of 112.  Right bundle branch block.  Left axis.  No acute ischemic changes.   RADIOLOGY    PROCEDURES:  Procedures   MEDICATIONS ORDERED IN ED: Medications  cefTRIAXone (ROCEPHIN) 1 g in sodium chloride 0.9 % 100 mL IVPB (0 g Intravenous Stopped 06/06/22 1909)  furosemide (LASIX) injection 40 mg (40 mg Intravenous Given 06/06/22 1847)     IMPRESSION / MDM / ASSESSMENT AND PLAN / ED COURSE  I reviewed the triage vital signs and the nursing notes.                              Differential diagnosis includes, but is not limited to, UTI, AKI, electrolyte abnormality, non-STEMI, anemia, peripheral edema  Patient's presentation is most consistent with acute presentation with potential threat to life or bodily function.  Patient presents with leg swelling, found to have suprapubic tenderness with chronic indwelling Foley.  Urinalysis consistent with UTI, nitrite positive, large bacteria and white blood cells.  Urine culture ordered, Rocephin given.   Cefdinir prescribed.  Instructions to have Foley changed in the middle of this antibiotic course to ensure eradication.  No respiratory symptoms or chest pain.  Peripheral edema appears to be related to chronic CHF without acute cardiac event.  Troponins are flat, EKG nonischemic, lungs are clear on exam.  Doubt ACS PE dissection pulmonary edema pleural effusion.  Patient given IV Lasix, having good urine output in the ED through the Foley.  Will increase Lasix to 40 twice daily, follow-up with heart failure clinic this week.       FINAL CLINICAL IMPRESSION(S) / ED DIAGNOSES   Final diagnoses:  Peripheral edema  Chronic congestive heart failure, unspecified heart failure type ([place])  Cystitis     Rx / DC Orders   ED Discharge Orders          Ordered    Catheters (FOLEY CATHETER 2-WAY) MISC   Once        06/06/22 1942    cefdinir (OMNICEF) 300 MG capsule  2 times daily        06/06/22 1938    furosemide (LASIX) 20 MG tablet  2 times daily        06/06/22 1938    potassium chloride SA (KLOR-CON M) 20 MEQ tablet  2 times daily        06/06/22 1938             Note:  This document was prepared using Dragon voice recognition software and may include unintentional dictation errors.   Carrie Mew, MD 06/06/22 [id number]

## 2022-06-06 NOTE — ED Notes (Signed)
1100 ml urine emptied from foley catheter bag

## 2022-06-08 ENCOUNTER — Telehealth: Payer: Self-pay | Admitting: Family

## 2022-06-08 NOTE — Telephone Encounter (Signed)
LVM with patient in attempt to schedule him for a new patient appointment with the CHF Clinic after receiving a referral from the ED.   Mazzie Brodrick, NT

## 2022-06-09 ENCOUNTER — Telehealth: Payer: Self-pay | Admitting: Family

## 2022-06-09 LAB — URINE CULTURE: Culture: >=100000 — AB

## 2022-06-09 NOTE — Telephone Encounter (Signed)
Called and scheduled patient after receiving an ER referral for end of january as patient needs time to establish a ride.   Juline Sanderford, NT

## 2022-06-10 ENCOUNTER — Inpatient Hospital Stay
Admission: EM | Admit: 2022-06-10 | Discharge: 2022-06-14 | DRG: 689 | Disposition: A | Discharge status: Home Health | Payer: Medicare Other | Attending: Internal Medicine | Admitting: Internal Medicine

## 2022-06-10 ENCOUNTER — Encounter: Payer: Self-pay | Admitting: Emergency Medicine

## 2022-06-10 DIAGNOSIS — Z7982 Long term (current) use of aspirin: Secondary | ICD-10-CM

## 2022-06-10 DIAGNOSIS — R262 Difficulty in walking, not elsewhere classified: Secondary | ICD-10-CM | POA: Diagnosis present

## 2022-06-10 DIAGNOSIS — I10 Essential (primary) hypertension: Secondary | ICD-10-CM | POA: Diagnosis present

## 2022-06-10 DIAGNOSIS — D500 Iron deficiency anemia secondary to blood loss (chronic): Secondary | ICD-10-CM

## 2022-06-10 DIAGNOSIS — E785 Hyperlipidemia, unspecified: Secondary | ICD-10-CM | POA: Insufficient documentation

## 2022-06-10 DIAGNOSIS — R5381 Other malaise: Secondary | ICD-10-CM | POA: Diagnosis present

## 2022-06-10 DIAGNOSIS — Z981 Arthrodesis status: Secondary | ICD-10-CM

## 2022-06-10 DIAGNOSIS — Z87891 Personal history of nicotine dependence: Secondary | ICD-10-CM

## 2022-06-10 DIAGNOSIS — N3001 Acute cystitis with hematuria: Principal | ICD-10-CM | POA: Diagnosis present

## 2022-06-10 DIAGNOSIS — R310 Gross hematuria: Secondary | ICD-10-CM

## 2022-06-10 DIAGNOSIS — L03115 Cellulitis of right lower limb: Secondary | ICD-10-CM | POA: Diagnosis present

## 2022-06-10 DIAGNOSIS — R4189 Other symptoms and signs involving cognitive functions and awareness: Secondary | ICD-10-CM | POA: Diagnosis present

## 2022-06-10 DIAGNOSIS — Z8673 Personal history of transient ischemic attack (TIA), and cerebral infarction without residual deficits: Secondary | ICD-10-CM

## 2022-06-10 DIAGNOSIS — I5033 Acute on chronic diastolic (congestive) heart failure: Secondary | ICD-10-CM | POA: Diagnosis present

## 2022-06-10 DIAGNOSIS — I110 Hypertensive heart disease with heart failure: Secondary | ICD-10-CM | POA: Diagnosis present

## 2022-06-10 DIAGNOSIS — Z885 Allergy status to narcotic agent status: Secondary | ICD-10-CM

## 2022-06-10 DIAGNOSIS — K219 Gastro-esophageal reflux disease without esophagitis: Secondary | ICD-10-CM | POA: Insufficient documentation

## 2022-06-10 DIAGNOSIS — R531 Weakness: Secondary | ICD-10-CM | POA: Diagnosis present

## 2022-06-10 DIAGNOSIS — Z905 Acquired absence of kidney: Secondary | ICD-10-CM

## 2022-06-10 DIAGNOSIS — N401 Enlarged prostate with lower urinary tract symptoms: Secondary | ICD-10-CM | POA: Diagnosis present

## 2022-06-10 DIAGNOSIS — Z87440 Personal history of urinary (tract) infections: Secondary | ICD-10-CM

## 2022-06-10 DIAGNOSIS — Z79899 Other long term (current) drug therapy: Secondary | ICD-10-CM

## 2022-06-10 DIAGNOSIS — R339 Retention of urine, unspecified: Principal | ICD-10-CM

## 2022-06-10 DIAGNOSIS — Z85528 Personal history of other malignant neoplasm of kidney: Secondary | ICD-10-CM

## 2022-06-10 DIAGNOSIS — N319 Neuromuscular dysfunction of bladder, unspecified: Secondary | ICD-10-CM | POA: Diagnosis present

## 2022-06-10 DIAGNOSIS — L03116 Cellulitis of left lower limb: Secondary | ICD-10-CM | POA: Diagnosis present

## 2022-06-10 DIAGNOSIS — N138 Other obstructive and reflux uropathy: Secondary | ICD-10-CM | POA: Diagnosis present

## 2022-06-10 DIAGNOSIS — R338 Other retention of urine: Secondary | ICD-10-CM | POA: Diagnosis present

## 2022-06-10 DIAGNOSIS — D649 Anemia, unspecified: Secondary | ICD-10-CM | POA: Diagnosis present

## 2022-06-10 DIAGNOSIS — L03119 Cellulitis of unspecified part of limb: Secondary | ICD-10-CM

## 2022-06-10 MED ORDER — MORPHINE SULFATE (PF) 4 MG/ML IV SOLN
4.0000 mg | Freq: Once | INTRAVENOUS | Status: AC
  Administered 2022-06-10: 4 mg via INTRAMUSCULAR
  Filled 2022-06-10: qty 1

## 2022-06-10 NOTE — Progress Notes (Signed)
ED Antimicrobial Stewardship Positive Culture Follow Up   Adam Rios. is an 82 y.o. male who presented to [facility] on 06/06/2022 with a chief complaint of left leg numbness- concern for UTI vs AKI. UA pyuria. PMH includes neurogenic bladder with chronic indwelling foley.   Chief Complaint  Patient presents with   Leg Pain    Recent Results (from the past 720 hour(s))  Urine Culture     Status: Abnormal   Collection Time: 06/06/22  4:20 PM   Specimen: Urine, Clean Catch  Result Value Ref Range Status   Specimen Description   Final    URINE, CLEAN CATCH Performed at [facility], [address] [place], [place] [address]    Special Requests   Final    NONE Performed at [facility], [place], [place] [address]    Culture (A)  Final    >=100,000 COLONIES/mL PSEUDOMONAS AERUGINOSA >=100,000 COLONIES/mL ESCHERICHIA COLI    Report Status 06/09/2022 FINAL  Final   Organism ID, Bacteria PSEUDOMONAS AERUGINOSA (A)  Final   Organism ID, Bacteria ESCHERICHIA COLI (A)  Final      Susceptibility   Escherichia coli - MIC*    AMPICILLIN <=2 SENSITIVE Sensitive     CEFAZOLIN <=4 SENSITIVE Sensitive     CEFEPIME <=0.12 SENSITIVE Sensitive     CEFTRIAXONE <=0.25 SENSITIVE Sensitive     CIPROFLOXACIN >=4 RESISTANT Resistant     GENTAMICIN <=1 SENSITIVE Sensitive     IMIPENEM <=0.25 SENSITIVE Sensitive     NITROFURANTOIN <=16 SENSITIVE Sensitive     TRIMETH/SULFA >=320 RESISTANT Resistant     AMPICILLIN/SULBACTAM <=2 SENSITIVE Sensitive     PIP/TAZO <=4 SENSITIVE Sensitive     * >=100,000 COLONIES/mL ESCHERICHIA COLI   Pseudomonas aeruginosa - MIC*    CEFTAZIDIME 4 SENSITIVE Sensitive     CIPROFLOXACIN <=0.25 SENSITIVE Sensitive     GENTAMICIN <=1 SENSITIVE Sensitive     IMIPENEM 2 SENSITIVE Sensitive     * >=100,000 COLONIES/mL PSEUDOMONAS AERUGINOSA    '[x]'$  Treated with cefdinir. Urine culture growing both E. Coli (sensitive to  cephalosporins) and Pseudomonas (sensitive to ciprofloxacin) '[]'$  Patient discharged originally without antimicrobial agent and treatment is now indicated  New antibiotic prescription: ciprofloxacin PO 500 mg BID x 7 days  ED Provider: Dr. Zipporah Plants, PharmD, BCPS Clinical Pharmacist  06/10/2022 4:41 PM  Clinical Pharmacist Monday - Friday phone -  [phone] Saturday - Sunday phone - [phone]

## 2022-06-10 NOTE — ED Triage Notes (Signed)
First nurse note:  Pt to ED via EMS from home c/o urinary retention.  Per EMS pt had chronic catheter changed today at 1600, has not had urine output, states testicles "are as big as grapefruits".  States this happens sometimes when cath is changed.

## 2022-06-10 NOTE — ED Triage Notes (Addendum)
Pt presents via POV with complaints of urinary retention following catheter insertion around 1600. Pt states he hasn't had any out since its insertion and he has some bladder discomfort. Pt on active treatment for UTI. Denies CP or SOB.   Bladder scan showed 216m - attempted to irrigate the catheter without success. Pt had small clots come out when flushed.

## 2022-06-10 NOTE — ED Provider Notes (Signed)
[facility] Provider Note   Event Date/Time   First MD Initiated Contact with Patient 06/10/22 2142     (approximate) History  Urinary Retention  HPI Adam Rios. is a 82 y.o. male with a stated past medical history of urinary retention with Foley catheter in place who presents for suprapubic abdominal pain as well as absent to urine output from the Foley catheter since it has been placed.  Staff in triage attempted to flush this Foley catheter and was able to get a small amount of blood clots but not a significant amount of urine.  Patient's bladder scan in our triage was 250 mL ROS: Patient currently denies any vision changes, tinnitus, difficulty speaking, facial droop, sore throat, chest pain, shortness of breath, nausea/vomiting/diarrhea, dysuria, or weakness/numbness/paresthesias in any extremity   Physical Exam  Triage Vital Signs: ED Triage Vitals [06/10/22 2112]  Enc Vitals Group     BP 127/80     Pulse Rate 85     Resp 18     Temp 98 F (36.7 C)     Temp Source Oral     SpO2 96 %     Weight      Height      Head Circumference      Peak Flow      Pain Score      Pain Loc      Pain Edu?      Excl. in [place]?    Most recent vital signs: Vitals:   06/10/22 2112  BP: 127/80  Pulse: 85  Resp: 18  Temp: 98 F (36.7 C)  SpO2: 96%   General: Awake, oriented x4. CV:  Good peripheral perfusion.  Resp:  Normal effort.  Abd:  No distention.  Tenderness to palpation in suprapubic region Other:  Elderly overweight Caucasian male laying in bed and in mild distress secondary to pain.  Foley catheter in place with dried blood around the urethral meatus with bloody urine in the Foley tubing ED Results / Procedures / Treatments   PROCEDURES: Critical Care performed: No Procedures MEDICATIONS ORDERED IN ED: Medications  morphine (PF) 4 MG/ML injection 4 mg (4 mg Intramuscular Given 06/10/22 2247)   IMPRESSION / MDM / ASSESSMENT AND PLAN / ED  COURSE  I reviewed the triage vital signs and the nursing notes.                             Patient's presentation is most consistent with acute presentation with potential threat to life or bodily function. 82 year old male presents with Foley catheter in place and with one day of acute urinary retention. Well appearing/nonseptic. Tolerating PO.  ED Workup: UA ED Interventions: Catheter placement  Patient exam and history not consistent with cauda equina, infectious etiology, constipation based retention/intraabdominal mass/AAA, trauma, nephro/urolithiasis, drug reaction, cancer.  Disposition: Care of this patient will be signed out to the oncoming physician at the end of my shift.  All pertinent patient information conveyed and all questions answered.  All further care and disposition decisions will be made by the oncoming physician.   FINAL CLINICAL IMPRESSION(S) / ED DIAGNOSES   Final diagnoses:  Urinary retention   Rx / DC Orders   ED Discharge Orders     None      Note:  This document was prepared using Dragon voice recognition software and may include unintentional dictation errors.   Naaman Plummer, MD  06/15/22 1756  

## 2022-06-11 ENCOUNTER — Inpatient Hospital Stay: Payer: Medicare Other

## 2022-06-11 ENCOUNTER — Other Ambulatory Visit: Payer: Self-pay

## 2022-06-11 DIAGNOSIS — N3001 Acute cystitis with hematuria: Principal | ICD-10-CM

## 2022-06-11 DIAGNOSIS — Z85528 Personal history of other malignant neoplasm of kidney: Secondary | ICD-10-CM | POA: Diagnosis not present

## 2022-06-11 DIAGNOSIS — Z981 Arthrodesis status: Secondary | ICD-10-CM | POA: Diagnosis not present

## 2022-06-11 DIAGNOSIS — I5033 Acute on chronic diastolic (congestive) heart failure: Secondary | ICD-10-CM | POA: Diagnosis present

## 2022-06-11 DIAGNOSIS — R262 Difficulty in walking, not elsewhere classified: Secondary | ICD-10-CM | POA: Diagnosis present

## 2022-06-11 DIAGNOSIS — L03116 Cellulitis of left lower limb: Secondary | ICD-10-CM | POA: Diagnosis present

## 2022-06-11 DIAGNOSIS — K219 Gastro-esophageal reflux disease without esophagitis: Secondary | ICD-10-CM | POA: Insufficient documentation

## 2022-06-11 DIAGNOSIS — E785 Hyperlipidemia, unspecified: Secondary | ICD-10-CM | POA: Insufficient documentation

## 2022-06-11 DIAGNOSIS — Z8673 Personal history of transient ischemic attack (TIA), and cerebral infarction without residual deficits: Secondary | ICD-10-CM | POA: Diagnosis not present

## 2022-06-11 DIAGNOSIS — R5381 Other malaise: Secondary | ICD-10-CM | POA: Diagnosis present

## 2022-06-11 DIAGNOSIS — R531 Weakness: Secondary | ICD-10-CM | POA: Diagnosis present

## 2022-06-11 DIAGNOSIS — R4189 Other symptoms and signs involving cognitive functions and awareness: Secondary | ICD-10-CM | POA: Diagnosis present

## 2022-06-11 DIAGNOSIS — L03115 Cellulitis of right lower limb: Secondary | ICD-10-CM | POA: Diagnosis present

## 2022-06-11 DIAGNOSIS — R338 Other retention of urine: Secondary | ICD-10-CM | POA: Diagnosis present

## 2022-06-11 DIAGNOSIS — N401 Enlarged prostate with lower urinary tract symptoms: Secondary | ICD-10-CM | POA: Diagnosis present

## 2022-06-11 DIAGNOSIS — Z87891 Personal history of nicotine dependence: Secondary | ICD-10-CM | POA: Diagnosis not present

## 2022-06-11 DIAGNOSIS — Z905 Acquired absence of kidney: Secondary | ICD-10-CM | POA: Diagnosis not present

## 2022-06-11 DIAGNOSIS — Z79899 Other long term (current) drug therapy: Secondary | ICD-10-CM | POA: Diagnosis not present

## 2022-06-11 DIAGNOSIS — N319 Neuromuscular dysfunction of bladder, unspecified: Secondary | ICD-10-CM | POA: Diagnosis present

## 2022-06-11 DIAGNOSIS — D649 Anemia, unspecified: Secondary | ICD-10-CM | POA: Diagnosis present

## 2022-06-11 DIAGNOSIS — D500 Iron deficiency anemia secondary to blood loss (chronic): Secondary | ICD-10-CM

## 2022-06-11 DIAGNOSIS — Z7982 Long term (current) use of aspirin: Secondary | ICD-10-CM | POA: Diagnosis not present

## 2022-06-11 DIAGNOSIS — Z885 Allergy status to narcotic agent status: Secondary | ICD-10-CM | POA: Diagnosis not present

## 2022-06-11 DIAGNOSIS — L03119 Cellulitis of unspecified part of limb: Secondary | ICD-10-CM

## 2022-06-11 DIAGNOSIS — I110 Hypertensive heart disease with heart failure: Secondary | ICD-10-CM | POA: Diagnosis present

## 2022-06-11 DIAGNOSIS — N138 Other obstructive and reflux uropathy: Secondary | ICD-10-CM

## 2022-06-11 DIAGNOSIS — R310 Gross hematuria: Secondary | ICD-10-CM

## 2022-06-11 LAB — URINALYSIS, ROUTINE W REFLEX MICROSCOPIC
Bilirubin Urine: NEGATIVE
Glucose, UA: NEGATIVE mg/dL
Ketones, ur: NEGATIVE mg/dL
Nitrite: NEGATIVE
Protein, ur: 100 mg/dL — AB
Specific Gravity, Urine: 1.018 (ref 1.005–1.030)
pH: 8 (ref 5.0–8.0)

## 2022-06-11 LAB — BASIC METABOLIC PANEL
Anion gap: 11 (ref 5–15)
BUN: 24 mg/dL — ABNORMAL HIGH (ref 8–23)
CO2: 26 mmol/L (ref 22–32)
Calcium: 8.4 mg/dL — ABNORMAL LOW (ref 8.9–10.3)
Chloride: 99 mmol/L (ref 98–111)
Creatinine, Ser: 1.26 mg/dL — ABNORMAL HIGH (ref 0.61–1.24)
GFR, Estimated: 57 mL/min — ABNORMAL LOW (ref >60)
Glucose, Bld: 115 mg/dL — ABNORMAL HIGH (ref 70–99)
Potassium: 3.5 mmol/L (ref 3.5–5.1)
Sodium: 136 mmol/L (ref 135–145)

## 2022-06-11 LAB — CBC WITH DIFFERENTIAL/PLATELET
Abs Immature Granulocytes: 0.03 10*3/uL (ref 0.00–0.07)
Basophils Absolute: 0 10*3/uL (ref 0.0–0.1)
Basophils Relative: 0 %
Eosinophils Absolute: 0.1 10*3/uL (ref 0.0–0.5)
Eosinophils Relative: 1 %
HCT: 37.7 % — ABNORMAL LOW (ref 39.0–52.0)
Hemoglobin: 11.8 g/dL — ABNORMAL LOW (ref 13.0–17.0)
Immature Granulocytes: 0 %
Lymphocytes Relative: 22 %
Lymphs Abs: 1.9 10*3/uL (ref 0.7–4.0)
MCH: 26.5 pg (ref 26.0–34.0)
MCHC: 31.3 g/dL (ref 30.0–36.0)
MCV: 84.7 fL (ref 80.0–100.0)
Monocytes Absolute: 0.7 10*3/uL (ref 0.1–1.0)
Monocytes Relative: 8 %
Neutro Abs: 5.9 10*3/uL (ref 1.7–7.7)
Neutrophils Relative %: 69 %
Platelets: 207 10*3/uL (ref 150–400)
RBC: 4.45 MIL/uL (ref 4.22–5.81)
RDW: 14.7 % (ref 11.5–15.5)
WBC: 8.7 10*3/uL (ref 4.0–10.5)
nRBC: 0 % (ref 0.0–0.2)

## 2022-06-11 LAB — PROTIME-INR
INR: 1.1 (ref 0.8–1.2)
Prothrombin Time: 13.8 seconds (ref 11.4–15.2)

## 2022-06-11 LAB — MRSA NEXT GEN BY PCR, NASAL: MRSA by PCR Next Gen: NOT DETECTED

## 2022-06-11 MED ORDER — TAMSULOSIN HCL 0.4 MG PO CAPS
0.4000 mg | ORAL_CAPSULE | Freq: Every day | ORAL | Status: DC
  Administered 2022-06-11 – 2022-06-14 (×4): 0.4 mg via ORAL
  Filled 2022-06-11 (×4): qty 1

## 2022-06-11 MED ORDER — ACETAMINOPHEN 325 MG PO TABS
650.0000 mg | ORAL_TABLET | Freq: Four times a day (QID) | ORAL | Status: DC | PRN
  Administered 2022-06-12 – 2022-06-14 (×5): 650 mg via ORAL
  Filled 2022-06-11 (×6): qty 2

## 2022-06-11 MED ORDER — DICLOFENAC SODIUM 1 % EX GEL: 2.0000 g | Freq: Two times a day (BID) | CUTANEOUS | Status: DC | PRN

## 2022-06-11 MED ORDER — LIDOCAINE 5 % EX PTCH: 2.0000 | MEDICATED_PATCH | Freq: Every day | CUTANEOUS | Status: DC | PRN

## 2022-06-11 MED ORDER — SENNOSIDES-DOCUSATE SODIUM 8.6-50 MG PO TABS
1.0000 | ORAL_TABLET | Freq: Two times a day (BID) | ORAL | Status: DC
  Administered 2022-06-11 – 2022-06-14 (×7): 1 via ORAL
  Filled 2022-06-11 (×7): qty 1

## 2022-06-11 MED ORDER — SODIUM CHLORIDE 0.9 % IR SOLN
3000.0000 mL | Status: DC
  Administered 2022-06-11: 3000 mL

## 2022-06-11 MED ORDER — MAGNESIUM HYDROXIDE 400 MG/5ML PO SUSP: 30.0000 mL | Freq: Every day | ORAL | Status: DC | PRN

## 2022-06-11 MED ORDER — ENOXAPARIN SODIUM 60 MG/0.6ML IJ SOSY: 0.5000 mg/kg | PREFILLED_SYRINGE | INTRAMUSCULAR | Status: DC

## 2022-06-11 MED ORDER — SODIUM CHLORIDE 0.9 % IV SOLN: INTRAVENOUS | Status: DC

## 2022-06-11 MED ORDER — ACETAMINOPHEN 500 MG PO TABS
500.0000 mg | ORAL_TABLET | Freq: Four times a day (QID) | ORAL | Status: DC | PRN
  Administered 2022-06-12: 500 mg via ORAL

## 2022-06-11 MED ORDER — SODIUM CHLORIDE 0.9 % IV SOLN
1.0000 g | Freq: Three times a day (TID) | INTRAVENOUS | Status: DC
  Administered 2022-06-11 – 2022-06-14 (×10): 1 g via INTRAVENOUS
  Filled 2022-06-11 (×2): qty 20
  Filled 2022-06-11: qty 1
  Filled 2022-06-11 (×3): qty 20
  Filled 2022-06-11 (×2): qty 1
  Filled 2022-06-11: qty 20
  Filled 2022-06-11: qty 1
  Filled 2022-06-11: qty 20

## 2022-06-11 MED ORDER — TRAZODONE HCL 50 MG PO TABS: 25.0000 mg | ORAL_TABLET | Freq: Every evening | ORAL | Status: DC | PRN

## 2022-06-11 MED ORDER — TRAZODONE HCL 50 MG PO TABS
50.0000 mg | ORAL_TABLET | Freq: Every day | ORAL | Status: DC
  Administered 2022-06-11 – 2022-06-13 (×3): 50 mg via ORAL
  Filled 2022-06-11 (×3): qty 1

## 2022-06-11 MED ORDER — FINASTERIDE 5 MG PO TABS
5.0000 mg | ORAL_TABLET | Freq: Every day | ORAL | Status: DC
  Administered 2022-06-11 – 2022-06-14 (×4): 5 mg via ORAL
  Filled 2022-06-11 (×4): qty 1

## 2022-06-11 MED ORDER — DONEPEZIL HCL 5 MG PO TABS
10.0000 mg | ORAL_TABLET | Freq: Every day | ORAL | Status: DC
  Administered 2022-06-11 – 2022-06-13 (×3): 10 mg via ORAL
  Filled 2022-06-11 (×3): qty 2

## 2022-06-11 MED ORDER — ATORVASTATIN CALCIUM 20 MG PO TABS
40.0000 mg | ORAL_TABLET | Freq: Every day | ORAL | Status: DC
  Administered 2022-06-11 – 2022-06-14 (×4): 40 mg via ORAL
  Filled 2022-06-11 (×4): qty 2

## 2022-06-11 MED ORDER — VITAMIN B-12 1000 MCG PO TABS
1000.0000 ug | ORAL_TABLET | Freq: Every day | ORAL | Status: DC
  Administered 2022-06-11 – 2022-06-14 (×4): 1000 ug via ORAL
  Filled 2022-06-11: qty 1
  Filled 2022-06-11: qty 2
  Filled 2022-06-11 (×2): qty 1

## 2022-06-11 MED ORDER — ONDANSETRON HCL 4 MG PO TABS: 4.0000 mg | ORAL_TABLET | Freq: Four times a day (QID) | ORAL | Status: DC | PRN

## 2022-06-11 MED ORDER — ACETAMINOPHEN 650 MG RE SUPP: 650.0000 mg | Freq: Four times a day (QID) | RECTAL | Status: DC | PRN

## 2022-06-11 MED ORDER — SODIUM CHLORIDE 0.9 % IV SOLN
1.0000 g | Freq: Once | INTRAVENOUS | Status: AC
  Administered 2022-06-11: 1 g via INTRAVENOUS
  Filled 2022-06-11: qty 10

## 2022-06-11 MED ORDER — FUROSEMIDE 10 MG/ML IJ SOLN
40.0000 mg | Freq: Two times a day (BID) | INTRAMUSCULAR | Status: DC
  Administered 2022-06-11 – 2022-06-14 (×6): 40 mg via INTRAVENOUS
  Filled 2022-06-11 (×6): qty 4

## 2022-06-11 MED ORDER — ONDANSETRON HCL 4 MG/2ML IJ SOLN: 4.0000 mg | Freq: Four times a day (QID) | INTRAMUSCULAR | Status: DC | PRN

## 2022-06-11 NOTE — Assessment & Plan Note (Addendum)
-   We will continue Flomax and Proscar.

## 2022-06-11 NOTE — Progress Notes (Signed)
[facility] PROGRESS NOTE  Adam Rios. HUT:[id number] DOB: 1941/04/10 DOA: 06/10/2022 PCP: Dola Argyle, MD  Status is: Inpatient Remains inpatient appropriate because:  generalized weakness, unable to walk  Barriers to discharge: Social:  Clinical:  Level of care: Telemetry Medical   Code Status:  Family Communication:  DVT prophylaxis:  COVID vaccination status:  Foley catheter: yes POA: daughter Date inserted: chronic  HPI:  Subjective:  Complaining of generalized weakness, lower extremity swelling  Objective: Vitals:   06/11/22 1500 06/11/22 1530  BP: 115/66 (!) 142/77  Pulse: (!) 36 86  Resp:    Temp:    SpO2: 94% 97%    Intake/Output Summary (Last 24 hours) at 06/11/2022 1818 Last data filed at 06/11/2022 0343 Gross per 24 hour  Intake 220 ml  Output 120 ml  Net 100 ml   There were no vitals filed for this visit.  Exam:  Constitutional: NAD, calm, comfortable Eyes: PERRL, lids and conjunctivae normal ENMT: Mucous membranes are moist. Posterior pharynx clear of any exudate or lesions.Normal dentition.  Neck: normal, supple, no masses, no thyromegaly Respiratory: clear to auscultation bilaterally, no wheezing, no crackles. Normal respiratory effort. No accessory muscle use.  Cardiovascular: Regular rate and rhythm, no murmurs / rubs / gallops. No extremity edema. 2+ pedal pulses. No carotid bruits.  Abdomen: no tenderness, no masses palpated. No hepatosplenomegaly. Bowel sounds positive.  Musculoskeletal: no clubbing / cyanosis. No joint deformity upper and lower extremities. Good ROM, no contractures. Normal muscle tone.  Skin: no rashes, lesions, ulcers. No induration Neurologic: CN 2-12 grossly intact. Sensation intact, DTR normal. Strength 5/5 x all 4 extremities.  Psychiatric: Normal judgment and insight. Alert and oriented x 3. Normal mood.    Assessment/Plan: 82 year old male with history of hypertension, neurogenic bladder,  CVA, cognitive impairment presented to the emergency department with acute onset of urinary retention, Foley catheter was replaced and was discharged home.  Patient noticed bloody urine, CBI was started.  Patient also experienced severe suprapubic pain.  Urology was consulted.  Patient's urine more cleared.  Continues to have severe generalized weakness, increased lower extremity edema.  Patient is hard of hearing.  Patient is also found to have lower extremity redness, swelling. Other problems:  * Acute hemorrhagic cystitis - The patient will be admitted to a medical bed. - This is likely the cause of hematuria. - We will continue antibiotic therapy with IV meropenem as both E. coli and Pseudomonas were sensitive to imipenem. - We will continue CBI given his urinary retention. - Urology consult to be obtained. - Dr. Erlene Quan was notified about the patient.   Lower extremity cellulitis - This should be covered with IV meropenem, vancomycin. - Warm compresses will be applied. - Pain management will be provided.  Congestive heart failure diastolic acute on chronic -continue with IV Lasix and follow-up  Severe debility - involve physical therapy   Iron deficiency anemia due to chronic blood loss - We will follow serial hemoglobins and hematocrits. - This is more acute and clearly secondary to his hematuria. - Aspirin will be held off.   BPH with obstruction/lower urinary tract symptoms - We will continue Flomax and Proscar.   GERD without esophagitis - We will continue PPI therapy.    Data Reviewed: Basic Metabolic Panel: Recent Labs  Lab 06/06/22 1620 06/11/22 0215  NA 137 136  K 3.5 3.5  CL 101 99  CO2 27 26  GLUCOSE 102* 115*  BUN 24* 24*  CREATININE 1.28* 1.26*  CALCIUM 8.9 8.4*   Liver Function Tests: No results for input(s): "AST", "ALT", "ALKPHOS", "BILITOT", "PROT", "ALBUMIN" in the last 168 hours. No results for input(s): "LIPASE", "AMYLASE" in the last 168  hours. No results for input(s): "AMMONIA" in the last 168 hours. CBC: Recent Labs  Lab 06/06/22 1620 06/11/22 0215  WBC 9.0 8.7  NEUTROABS  --  5.9  HGB 13.2 11.8*  HCT 42.5 37.7*  MCV 86.0 84.7  PLT 221 207   Cardiac Enzymes: No results for input(s): "CKTOTAL", "CKMB", "CKMBINDEX", "TROPONINI" in the last 168 hours. BNP (last 3 results) Recent Labs    09/08/21 0110 01/01/22 1720 04/29/22 1642  BNP 85.9 71.9 119.6*    ProBNP (last 3 results) No results for input(s): "PROBNP" in the last 8760 hours.  CBG: No results for input(s): "GLUCAP" in the last 168 hours.  Recent Results (from the past 240 hour(s))  Urine Culture     Status: Abnormal   Collection Time: 06/06/22  4:20 PM   Specimen: Urine, Clean Catch  Result Value Ref Range Status   Specimen Description   Final    URINE, CLEAN CATCH Performed at [facility], [address] [place], [place] [address]    Special Requests   Final    NONE Performed at [facility], [place], [place] [address]    Culture (A)  Final    >=100,000 COLONIES/mL PSEUDOMONAS AERUGINOSA >=100,000 COLONIES/mL ESCHERICHIA COLI    Report Status 06/09/2022 FINAL  Final   Organism ID, Bacteria PSEUDOMONAS AERUGINOSA (A)  Final   Organism ID, Bacteria ESCHERICHIA COLI (A)  Final      Susceptibility   Escherichia coli - MIC*    AMPICILLIN <=2 SENSITIVE Sensitive     CEFAZOLIN <=4 SENSITIVE Sensitive     CEFEPIME <=0.12 SENSITIVE Sensitive     CEFTRIAXONE <=0.25 SENSITIVE Sensitive     CIPROFLOXACIN >=4 RESISTANT Resistant     GENTAMICIN <=1 SENSITIVE Sensitive     IMIPENEM <=0.25 SENSITIVE Sensitive     NITROFURANTOIN <=16 SENSITIVE Sensitive     TRIMETH/SULFA >=320 RESISTANT Resistant     AMPICILLIN/SULBACTAM <=2 SENSITIVE Sensitive     PIP/TAZO <=4 SENSITIVE Sensitive     * >=100,000 COLONIES/mL ESCHERICHIA COLI   Pseudomonas aeruginosa - MIC*    CEFTAZIDIME 4 SENSITIVE Sensitive      CIPROFLOXACIN <=0.25 SENSITIVE Sensitive     GENTAMICIN <=1 SENSITIVE Sensitive     IMIPENEM 2 SENSITIVE Sensitive     * >=100,000 COLONIES/mL PSEUDOMONAS AERUGINOSA  MRSA Next Gen by PCR, Nasal     Status: None   Collection Time: 06/11/22  1:39 PM   Specimen: Nasal Mucosa; Nasal Swab  Result Value Ref Range Status   MRSA by PCR Next Gen NOT DETECTED NOT DETECTED Final    Comment: (NOTE) The GeneXpert MRSA Assay (FDA approved for NASAL specimens only), is one component of a comprehensive MRSA colonization surveillance program. It is not intended to diagnose MRSA infection nor to guide or monitor treatment for MRSA infections. Test performance is not FDA approved in patients less than 50 years old. Performed at [facility], [address] [place], [place] [address]      Studies: US PELVIS LIMITED (TRANSABDOMINAL ONLY)  Result Date: 06/11/2022 CLINICAL DATA:  Gross hematuria. Status post bladder irrigation today. EXAM: LIMITED ULTRASOUND OF PELVIS TECHNIQUE: Limited transabdominal ultrasound examination of the pelvis was performed. COMPARISON:  Abdomen and pelvis CT dated 04/30/2022. FINDINGS: Small amount of urine  in the urinary bladder with a Foley catheter in place. Again demonstrated is marked enlargement and heterogeneity of the prostate gland. IMPRESSION: 1. Decompressed urinary bladder with a Foley catheter in place. 2. Stable markedly enlarged and heterogeneous prostate gland. Electronically Signed   By: Claudie Revering M.D.   On: 06/11/2022 09:40    Scheduled Meds:  atorvastatin  40 mg Oral Daily   cyanocobalamin  1,000 mcg Oral Daily   donepezil  10 mg Oral QHS   finasteride  5 mg Oral Daily   senna-docusate  1 tablet Oral BID   tamsulosin  0.4 mg Oral Daily   traZODone  50-100 mg Oral QHS   Continuous Infusions:  sodium chloride 100 mL/hr at 06/11/22 0881   meropenem (MERREM) IV Stopped (06/11/22 1543)    Principal Problem:   Acute hemorrhagic  cystitis Active Problems:   Lower extremity cellulitis   BPH with obstruction/lower urinary tract symptoms   Iron deficiency anemia due to chronic blood loss   Essential hypertension   Dyslipidemia   GERD without esophagitis   Consultants: Urology  Procedures: CBI  Antibiotics: Meropenem 1/10   Time spent: 40 min    Ismerai Bin ANP  [facility] 7 am - 330 pm/M-F for direct patient care and secure chat Please refer to [place] for contact info

## 2022-06-11 NOTE — Assessment & Plan Note (Addendum)
-   We will follow serial hemoglobins and hematocrits. - This is more acute and clearly secondary to his hematuria. - Aspirin will be held off.

## 2022-06-11 NOTE — Consult Note (Addendum)
Urology Consult  I have been asked to see the patient by Dr. Sidney Ace, for evaluation and management of urinary retention, gross hematuria, acute hemorrhagic cystitis on CBI.  Chief Complaint: Nondraining Foley catheter  History of Present Illness: Adam Golob. is a 82 y.o. year old male with a complex urologic history with urinary retention awaiting HOLEP at [facility] in March managed with chronic indwelling Foley catheter and recurrent MDR UTIs and hematuria on finasteride requiring multiple admissions on CBI who presented to the ED last night with reports of nondraining Foley catheter.  He was seen in the emergency department 5 days ago and reported abdominal distention and BLE edema.  He had some suprapubic tenderness, so a UA was checked and appeared grossly infected.  He was treated for acute cystitis with Rocephin and transitioned to cefdinir.  Urine culture finalized with Pseudomonas aeruginosa and Cipro and Bactrim resistant E. coli.  Foley catheter was maintained at that time.  His home health agency subsequently exchanged his Foley catheter at home yesterday and he presented to the ED late last night with reports that it had not drained since placement.  Bladder scan revealed 500 cc despite Foley catheter in place.  His Foley catheter was flushed by ED staff with clearance of small clots.  His Foley was exchanged for a 24 [place] three-way and he was started on CBI.  Admission labs notable for WBC count 8.7 and creatinine 1.26, at baseline.  UA and urine culture have been ordered, but not yet collected.  He has been started on antibiotics as below.  He has been afebrile, VSS.  Today he reports diffuse edema over baseline.  He clarifies that he is swollen everywhere, not just in his scrotum per an ED triage note.  Foley catheter in place draining clear, yellow urine on slow drip CBI.  There is a small volume of coagulated blood at the meatus.  The right epididymis is slightly enlarged and  he is diffusely tender on scrotal exam, however overall exam is not particularly impressive and there is no scrotal edema, erythema, crepitus, fluctuance, or purulence noted.  I irrigated the patient's catheter at the bedside, see procedure note below.  Catheter irrigated easily and efflux remained clear yellow.  Pelvic ultrasound revealed a decompressed urinary bladder with no evidence of retained clot.  Anti-infectives (From admission, onward)    Start     Dose/Rate Route Frequency Ordered Stop   06/11/22 0800  meropenem (MERREM) 1 g in sodium chloride 0.9 % 100 mL IVPB        1 g 200 mL/hr over 30 Minutes Intravenous Every 8 hours 06/11/22 0501     06/11/22 0215  cefTRIAXone (ROCEPHIN) 1 g in sodium chloride 0.9 % 100 mL IVPB        1 g 200 mL/hr over 30 Minutes Intravenous  Once 06/11/22 0204 06/11/22 0343        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[place])    Hypertension    Neurogenic bladder    Renal cell carcinoma ([place]) 2005   Renal disorder    Stroke ([place])     01/17/18, 12/21   Some weakness and cognitive decline    Past Surgical History:  Procedure Laterality Date   APPENDECTOMY     CATARACT EXTRACTION W/PHACO Right 07/22/2020   Procedure: CATARACT EXTRACTION PHACO AND INTRAOCULAR LENS  PLACEMENT (IOC) RIGHT;  Surgeon: Eulogio Bear, MD;  Location: [place];  Service: Ophthalmology;  Laterality: Right;  4.77 0:44.2   CATARACT EXTRACTION W/PHACO Left 08/05/2020   Procedure: CATARACT EXTRACTION PHACO AND INTRAOCULAR LENS PLACEMENT (IOC) LEFT 10.73 01:13.8;  Surgeon: Eulogio Bear, MD;  Location: [place];  Service: Ophthalmology;  Laterality: Left;  Use eye stretcher not chair   HERNIA REPAIR     LUMBAR FUSION     NEPHRECTOMY Left 2005   TEMPORARY PACEMAKER N/A 05/03/2022   Procedure: TEMPORARY PACEMAKER;  Surgeon:  Yolonda Kida, MD;  Location: [place] [facility];  Service: Cardiovascular;  Laterality: N/A;    Home Medications:  Current Meds  Medication Sig   acetaminophen (TYLENOL) 325 MG tablet Take 500 mg by mouth every 6 (six) hours as needed.   aspirin 81 MG EC tablet Take 81 mg by mouth daily as needed.   atorvastatin (LIPITOR) 40 MG tablet Take 40 mg by mouth daily.   cefdinir (OMNICEF) 300 MG capsule Take 1 capsule (300 mg total) by mouth 2 (two) times daily.   diclofenac Sodium (VOLTAREN) 1 % GEL Apply 2 g topically 2 (two) times daily as needed.   donepezil (ARICEPT) 10 MG tablet Take 10 mg by mouth at bedtime.   finasteride (PROSCAR) 5 MG tablet Take 5 mg by mouth daily.   furosemide (LASIX) 20 MG tablet Take 20 mg by mouth 2 (two) times daily.   gabapentin (NEURONTIN) 300 MG capsule Take 1 capsule (300 mg total) by mouth 2 (two) times daily. (Patient taking differently: Take 300-600 mg by mouth as directed. 2 in the am, 4 after lunch (May split up lunch dose [1 after lunch and 3 at bedtime]).)   hydrocortisone cream (CVS CORTISONE MAXIMUM STRENGTH) 1 % Apply 1 Application topically as needed for itching.   lidocaine (LIDODERM) 5 % Place 2 patches onto the skin daily as needed.   mirabegron ER (MYRBETRIQ) 25 MG TB24 tablet Take 25 mg by mouth daily.   omeprazole (PRILOSEC) 20 MG capsule Take 1 capsule (20 mg total) by mouth daily.   potassium chloride SA (KLOR-CON M) 20 MEQ tablet Take 1 tablet (20 mEq total) by mouth 2 (two) times daily for 5 days.   senna-docusate (SENOKOT-S) 8.6-50 MG tablet Take 1 tablet by mouth 2 (two) times daily. (Patient taking differently: Take 1 tablet by mouth 2 (two) times daily as needed.)   tamsulosin (FLOMAX) 0.4 MG CAPS capsule Take 0.4 mg by mouth daily.   vitamin B-12 (CYANOCOBALAMIN) 1000 MCG tablet Take 1,000 mcg by mouth daily.    Allergies:  Allergies  Allergen Reactions   Fentanyl Nausea Only   Oxycodone Nausea And Vomiting    Family  History  Problem Relation Age of Onset   Cerebral aneurysm Father    Cerebral aneurysm Sister     Social History:  reports that he quit smoking about 19 years ago. His smoking use included cigarettes. He has a 2.50 pack-year smoking history. He has never used smokeless tobacco. He reports that he does not currently use alcohol. He reports that he does not use drugs.  ROS: A complete review of systems was performed.  All systems are negative except for pertinent findings as noted.  Physical Exam:  Vital signs in last 24 hours: Temp:  [97.5 F (36.4 C)-98 F (36.7 C)] 97.8 F (36.6 C) (01/11 0630) Pulse Rate:  [77-92] 89 (01/11 0930) Resp:  [16-18] 18 (01/11 0630) BP: (103-127)/(54-80)  103/54 (01/11 0930) SpO2:  [91 %-96 %] 93 % (01/11 0930) Constitutional:  Alert, no acute distress HEENT: [place] AT, moist mucus membranes Cardiovascular: BLE edema Respiratory: Normal respiratory effort GI: Abdomen is protuberant GU: See HPI Skin: No rashes, bruises or suspicious lesions Neurologic: Grossly intact, no focal deficits, moving all 4 extremities Psychiatric: Normal mood and affect  Laboratory Data:  Recent Labs    06/11/22 0215  WBC 8.7  HGB 11.8*  HCT 37.7*   Recent Labs    06/11/22 0215  NA 136  K 3.5  CL 99  CO2 26  GLUCOSE 115*  BUN 24*  CREATININE 1.26*  CALCIUM 8.4*   Recent Labs    06/11/22 0215  INR 1.1   Urinalysis    Component Value Date/Time   COLORURINE YELLOW (A) 06/06/2022 1620   APPEARANCEUR HAZY (A) 06/06/2022 1620   LABSPEC 1.013 06/06/2022 1620   PHURINE 5.0 06/06/2022 1620   GLUCOSEU NEGATIVE 06/06/2022 1620   HGBUR MODERATE (A) 06/06/2022 1620   BILIRUBINUR NEGATIVE 06/06/2022 1620   KETONESUR NEGATIVE 06/06/2022 1620   PROTEINUR NEGATIVE 06/06/2022 1620   NITRITE POSITIVE (A) 06/06/2022 1620   LEUKOCYTESUR LARGE (A) 06/06/2022 1620   Results for orders placed or performed during the hospital encounter of 06/06/22  Urine Culture      Status: Abnormal   Collection Time: 06/06/22  4:20 PM   Specimen: Urine, Clean Catch  Result Value Ref Range Status   Specimen Description   Final    URINE, CLEAN CATCH Performed at [facility], [address] [place], [place] [address]    Special Requests   Final    NONE Performed at [facility], [address] [place], [place] [address]    Culture (A)  Final    >=100,000 COLONIES/mL PSEUDOMONAS AERUGINOSA >=100,000 COLONIES/mL ESCHERICHIA COLI    Report Status 06/09/2022 FINAL  Final   Organism ID, Bacteria PSEUDOMONAS AERUGINOSA (A)  Final   Organism ID, Bacteria ESCHERICHIA COLI (A)  Final      Susceptibility   Escherichia coli - MIC*    AMPICILLIN <=2 SENSITIVE Sensitive     CEFAZOLIN <=4 SENSITIVE Sensitive     CEFEPIME <=0.12 SENSITIVE Sensitive     CEFTRIAXONE <=0.25 SENSITIVE Sensitive     CIPROFLOXACIN >=4 RESISTANT Resistant     GENTAMICIN <=1 SENSITIVE Sensitive     IMIPENEM <=0.25 SENSITIVE Sensitive     NITROFURANTOIN <=16 SENSITIVE Sensitive     TRIMETH/SULFA >=320 RESISTANT Resistant     AMPICILLIN/SULBACTAM <=2 SENSITIVE Sensitive     PIP/TAZO <=4 SENSITIVE Sensitive     * >=100,000 COLONIES/mL ESCHERICHIA COLI   Pseudomonas aeruginosa - MIC*    CEFTAZIDIME 4 SENSITIVE Sensitive     CIPROFLOXACIN <=0.25 SENSITIVE Sensitive     GENTAMICIN <=1 SENSITIVE Sensitive     IMIPENEM 2 SENSITIVE Sensitive     * >=100,000 COLONIES/mL PSEUDOMONAS AERUGINOSA   Radiologic Imaging: US PELVIS LIMITED (TRANSABDOMINAL ONLY)  Result Date: 06/11/2022 CLINICAL DATA:  Gross hematuria. Status post bladder irrigation today. EXAM: LIMITED ULTRASOUND OF PELVIS TECHNIQUE: Limited transabdominal ultrasound examination of the pelvis was performed. COMPARISON:  Abdomen and pelvis CT dated 04/30/2022. FINDINGS: Small amount of urine in the urinary bladder with a Foley catheter in place. Again demonstrated is marked enlargement and heterogeneity of the prostate  gland. IMPRESSION: 1. Decompressed urinary bladder with a Foley catheter in place. 2. Stable markedly enlarged and heterogeneous prostate gland. Electronically Signed   By: Percell Locus.D.  On: 06/11/2022 09:40    Procedures: Bladder Irrigation  Due to gross hematuria patient is present today for a bladder irrigation. Patient was cleaned and prepped in a sterile fashion. 360 mL of /sterile water was instilled into the bladder with a 21m [place] syringe through the catheter in place.  3666mof urine return was cleared from the bladder with evacuation of 0ccs of clot material. Efflux remained clear yellow throughout the procedure and the catheter irrigated easily. Upon completion, the catheter was draining well and was reattached to the night bag for drainage. Patient tolerated well.   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  Gross hematuria has resolved on slow drip CBI and there is no evidence of retained clot on imaging.  No indication for urgent urologic intervention at this time.  Agree with management of possible hemorrhagic cystitis per primary team.  He is now on culture appropriate abx with pseudomonal coverage. Will proceed with a clamp trial of CBI and if his urine remains light pink or clear by the end of the day, will discontinue CBI.  I had lengthy conversations with the patient at the bedside, his daughter via telephone, and TOC regarding billing concerns. He has previously [facility] because he has [facility] care [facility]. His daughter reiterates financial concerns [facility], the patient reports he is not ready to pursue HOLEP right now and would rather wait. Therefore we  recommend he keep plans for [facility] Thank you for involving me in this patient's care, please page with any further questions or concerns.  SaDebroah LoopPA-C 06/11/2022 9:50 AM

## 2022-06-11 NOTE — Assessment & Plan Note (Signed)
-   We will continue his antihypertensives. 

## 2022-06-11 NOTE — ED Notes (Signed)
Patient complaining of increasing pressure in abdomen and bladder.  Catheter irrigated and rate of CBI increased.  Small amount of blood noted in output.  Patient reported improvement after irrigation.

## 2022-06-11 NOTE — Assessment & Plan Note (Signed)
-   This should be covered with IV meropenem. - Warm compresses will be applied. - Pain management will be provided.

## 2022-06-11 NOTE — H&P (Addendum)
[place]   PATIENT NAME: Adam Rios    MR#:  [id number]  DATE OF BIRTH:  1940-06-16  DATE OF ADMISSION:  06/10/2022  PRIMARY CARE PHYSICIAN: Dola Argyle, MD   Patient is coming from: Home  REQUESTING/REFERRING PHYSICIAN: Lurline Hare, MD  CHIEF COMPLAINT:   Chief Complaint  Patient presents with   Urinary Retention    HISTORY OF PRESENT ILLNESS:  Adam Rios. is a 82 y.o. Caucasian male with medical history significant for hypertension, neurogenic bladder, CVA, cognitive impairment, who presented to the emergency room with acute onset urinary retention sepsis Foley catheter with replaced around 4 PM.  He admitted to chills without fever.  He was recently treated for UTI which led him to go to Pseudomonas aeruginosa in addition to a E. coli.  The patient was noted to have urinary retention with grade then 500 mL on repeat bladder scan.  After Foley catheter he was noted to have hematuria and had CBI started.  He denied any nausea or vomiting however has been having suprapubic pain with urinary retention.  No chest pain or dyspnea or cough or wheezing.  No paresthesias or focal muscle weakness.  He has been having worsening lower extremity edema with associated erythema and tenderness.   ED Course: Upon presentation to the emergency room, vital signs were within normal.  BUN was 24 and creatinine 1.26 and CBC showed anemia with hemoglobin 11.8 hematocrit 37.7 compared to 13.2 and 42.5 on 06/06/2022.  INR is 1.1 and PT 13.8.  The patient was given a gram of IV Rocephin and 4 mg of IV morphine sulfate and had CBI placed as mentioned above.  He will be admitted to a medical telemetry bed for further evaluation and management. PAST MEDICAL HISTORY:   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[place])    Hypertension    Neurogenic bladder    Renal cell  carcinoma ([place]) 2005   Renal disorder    Stroke ([place])     01/17/18, 12/21   Some weakness and cognitive decline    PAST SURGICAL HISTORY:   Past Surgical History:  Procedure Laterality Date   APPENDECTOMY     CATARACT EXTRACTION W/PHACO Right 07/22/2020   Procedure: CATARACT EXTRACTION PHACO AND INTRAOCULAR LENS PLACEMENT ([place]) RIGHT;  Surgeon: Adam Bear, MD;  Location: [place];  Service: Ophthalmology;  Laterality: Right;  4.77 0:44.2   CATARACT EXTRACTION W/PHACO Left 08/05/2020   Procedure: CATARACT EXTRACTION PHACO AND INTRAOCULAR LENS PLACEMENT (IOC) LEFT 10.73 01:13.8;  Surgeon: Adam Bear, MD;  Location: [place];  Service: Ophthalmology;  Laterality: Left;  Use eye stretcher not chair   HERNIA REPAIR     LUMBAR FUSION     NEPHRECTOMY Left 2005   TEMPORARY PACEMAKER N/A 05/03/2022   Procedure: TEMPORARY PACEMAKER;  Surgeon: Yolonda Kida, MD;  Location: [place] [facility];  Service: Cardiovascular;  Laterality: N/A;    SOCIAL HISTORY:   Social History   Tobacco Use   Smoking status: Former    Packs/day: 0.25    Years: 10.00    Total pack years: 2.50    Types: Cigarettes    Quit date: 2005    Years since quitting: 19.0   Smokeless tobacco: Never  Substance Use Topics   Alcohol use: Not Currently    FAMILY HISTORY:  Family History  Problem Relation Age of Onset   Cerebral aneurysm Father    Cerebral aneurysm Sister     DRUG ALLERGIES:   Allergies  Allergen Reactions   Fentanyl Nausea Only   Oxycodone Nausea And Vomiting    REVIEW OF SYSTEMS:   ROS As per history of present illness. All pertinent systems were reviewed above. Constitutional, HEENT, cardiovascular, respiratory, GI, GU, musculoskeletal, neuro, psychiatric, endocrine, integumentary and hematologic systems were reviewed and are otherwise negative/unremarkable except for positive findings mentioned above in the HPI.   MEDICATIONS AT HOME:   Prior  to Admission medications   Medication Sig Start Date End Date Taking? Authorizing Provider  acetaminophen (TYLENOL) 325 MG tablet Take 500 mg by mouth every 6 (six) hours as needed. 08/14/14   [provider]  aspirin 81 MG EC tablet Take 81 mg by mouth daily as needed.    [provider]  atorvastatin (LIPITOR) 40 MG tablet Take 40 mg by mouth daily.    [provider]  cefdinir (OMNICEF) 300 MG capsule Take 1 capsule (300 mg total) by mouth 2 (two) times daily. 06/06/22   Carrie Mew, MD  ciprofloxacin (CIPRO) 500 MG tablet Take 500 mg by mouth 2 (two) times daily. 7 day course.    [provider]  diclofenac Sodium (VOLTAREN) 1 % GEL Apply 2 g topically 2 (two) times daily as needed. 05/11/21   [provider]  finasteride (PROSCAR) 5 MG tablet Take 5 mg by mouth daily. 04/27/22   [provider]  furosemide (LASIX) 20 MG tablet Take 20 mg by mouth 2 (two) times daily.    [provider]  furosemide (LASIX) 20 MG tablet Take 2 tablets (40 mg total) by mouth 2 (two) times daily for 3 days. 06/06/22 06/09/22  Carrie Mew, MD  gabapentin (NEURONTIN) 300 MG capsule Take 1 capsule (300 mg total) by mouth 2 (two) times daily. Patient taking differently: Take 300-600 mg by mouth as directed. 2 in the am, 4 after lunch (May split up lunch dose [1 after lunch and 3 at bedtime]). 11/28/21   Ralene Muskrat B, MD  hydrocortisone cream (CVS CORTISONE MAXIMUM STRENGTH) 1 % Apply 1 Application topically as needed for itching.    [provider]  lidocaine (LIDODERM) 5 % Place 2 patches onto the skin daily as needed. 09/04/21   [provider]  mirabegron ER (MYRBETRIQ) 25 MG TB24 tablet Take 25 mg by mouth daily.    [provider]  omeprazole (PRILOSEC) 20 MG capsule Take 1 capsule (20 mg total) by mouth daily. 05/21/21   Jennye Boroughs, MD  potassium chloride SA (KLOR-CON M) 20 MEQ tablet Take 1 tablet (20 mEq  total) by mouth 2 (two) times daily for 5 days. 06/06/22 06/11/22  Carrie Mew, MD  senna-docusate (SENOKOT-S) 8.6-50 MG tablet Take 1 tablet by mouth 2 (two) times daily. Patient taking differently: Take 1 tablet by mouth 2 (two) times daily as needed. 09/10/21   Dwyane Dee, MD  tamsulosin (FLOMAX) 0.4 MG CAPS capsule Take 0.4 mg by mouth daily.    [provider]  traZODone (DESYREL) 50 MG tablet Take 50-100 mg by mouth at bedtime. 08/12/21   [provider]  vitamin B-12 (CYANOCOBALAMIN) 1000 MCG tablet Take 1,000 mcg by mouth daily.    [provider]      VITAL SIGNS:  Blood pressure 105/68, pulse 82, temperature (!) 97.5 F (36.4 C), temperature source Oral, resp. rate 18, SpO2  92 %.  PHYSICAL EXAMINATION:  Physical Exam  GENERAL:  82 y.o.-year-old Caucasian male patient lying in the bed with no acute distress.  EYES: Pupils equal, round, reactive to light and accommodation. No scleral icterus. Extraocular muscles intact.  HEENT: Head atraumatic, normocephalic. Oropharynx and nasopharynx clear.  NECK:  Supple, no jugular venous distention. No thyroid enlargement, no tenderness.  LUNGS: Normal breath sounds bilaterally, no wheezing, rales,rhonchi or crepitation. No use of accessory muscles of respiration.  CARDIOVASCULAR: Regular rate and rhythm, S1, S2 normal. No murmurs, rubs, or gallops.  ABDOMEN: Soft, nondistended, nontender. Bowel sounds present. No organomegaly or mass.  EXTREMITIES: No pedal edema, cyanosis, or clubbing.  NEUROLOGIC: Cranial nerves II through XII are intact. Muscle strength 5/5 in all extremities. Sensation intact. Gait not checked.  PSYCHIATRIC: The patient is alert and oriented x 3.  Normal affect and good eye contact. SKIN: Bilateral lower extremity erythema with induration, warmth and tenderness.  LABORATORY PANEL:   CBC Recent Labs  Lab 06/11/22 0215  WBC 8.7  HGB 11.8*  HCT 37.7*  PLT 207    ------------------------------------------------------------------------------------------------------------------  Chemistries  Recent Labs  Lab 06/11/22 0215  NA 136  K 3.5  CL 99  CO2 26  GLUCOSE 115*  BUN 24*  CREATININE 1.26*  CALCIUM 8.4*   ------------------------------------------------------------------------------------------------------------------  Cardiac Enzymes No results for input(s): "TROPONINI" in the last 168 hours. ------------------------------------------------------------------------------------------------------------------  RADIOLOGY:  No results found.    IMPRESSION AND PLAN:  Assessment and Plan: * Acute hemorrhagic cystitis - The patient will be admitted to a medical bed. - This is likely the cause of hematuria. - We will continue antibiotic therapy with IV meropenem as both E. coli and Pseudomonas were sensitive to imipenem. - We will continue CBI given his urinary retention. - Urology consult to be obtained. - Dr. Erlene Quan was notified about the patient.  Lower extremity cellulitis - This should be covered with IV meropenem. - Warm compresses will be applied. - Pain management will be provided.  Iron deficiency anemia due to chronic blood loss - We will follow serial hemoglobins and hematocrits. - This is more acute and clearly secondary to his hematuria. - Aspirin will be held off.  BPH with obstruction/lower urinary tract symptoms - We will continue Flomax and Proscar.  GERD without esophagitis - We will continue PPI therapy.  Dyslipidemia - We will continue statin therapy.  Essential hypertension - We will continue his antihypertensives.     DVT prophylaxis: SCDs.  Medical prophylaxis contraindicated due to his hematuria. Advanced Care Planning:  Code Status: full code. Family Communication:  The plan of care was discussed in details with the patient (and family). I answered all questions. The patient agreed to proceed  with the above mentioned plan. Further management will depend upon hospital course. Disposition Plan: Back to previous home environment Consults called: Urology. All the records are reviewed and case discussed with ED provider.  Status is: Inpatient   At the time of the admission, it appears that the appropriate admission status for this patient is inpatient.  This is judged to be reasonable and necessary in order to provide the required intensity of service to ensure the patient's safety given the presenting symptoms, physical exam findings and initial radiographic and laboratory data in the context of comorbid conditions.  The patient requires inpatient status due to high intensity of service, high risk of further deterioration and high frequency of surveillance required.  I certify that at the time of admission, it is  my clinical judgment that the patient will require inpatient hospital care extending more than 2 midnights.                            Dispo: The patient is from: Home              Anticipated d/c is to: Home              Patient currently is not medically stable to d/c.              Difficult to place patient: No  Christel Mormon M.D on 06/11/2022 at 6:09 AM  [facility]   From 7 PM-7 AM, contact night-coverage [email or web address]  CC: Primary care physician; Dola Argyle, MD

## 2022-06-11 NOTE — ED Notes (Signed)
Patient provided with warm blankets.  Towels placed under lower legs for comfort and decreased pressure on heels.

## 2022-06-11 NOTE — Assessment & Plan Note (Signed)
-   We will continue PPI therapy 

## 2022-06-11 NOTE — Assessment & Plan Note (Signed)
-   We will continue statin therapy. 

## 2022-06-11 NOTE — Progress Notes (Signed)
PHARMACIST - PHYSICIAN COMMUNICATION  CONCERNING:  Enoxaparin (Lovenox) for DVT Prophylaxis    RECOMMENDATION: Patient was prescribed enoxaprin '40mg'$  q24 hours for VTE prophylaxis.   There were no vitals filed for this visit.  There is no height or weight on file to calculate BMI.  Estimated Creatinine Clearance: 58.7 mL/min (A) (by C-G formula based on SCr of 1.26 mg/dL (H)).   Based on [place] patient is candidate for enoxaparin 0.'5mg'$ /kg TBW SQ every 24 hours based on BMI being >30.  DESCRIPTION: Pharmacy has adjusted enoxaparin dose per [facility] policy.  Patient is now receiving enoxaparin 0.5 mg/kg every 24 hours   Renda Rolls, PharmD, [facility] 06/11/2022 5:11 AM

## 2022-06-11 NOTE — TOC Initial Note (Signed)
Transition of Care (TOC) - Initial/Assessment Note    Patient Details  Name: Adam Rios. MRN: [id number] Date of Birth: 10/30/1940  Transition of Care [facility]) CM/SW Contact:    Adam Hutching, RN Phone Number: 06/11/2022, 2:13 PM  Clinical Narrative:                 Patient being admitted to the hospital with acute hemorrhagic cystitis.  Patient has history of recurrent UTIs, he has a chronic foley that he has had for years, before the foley he was self cathing.  Patient reports that he has not been able to void naturally for 12 years.  Patient is scheduled at [facility] in early March for [place].  Patient has charity Care with [facility].  Urology initially thought about trying to do the HOLEP here sooner but has since decided against that.   RNCM met with patient at the bedside, introduced self and explained role in discharge planning.  Patient lives with his daughter in [place].  Patient reports that he is pretty independent, he can bath and dress himself and get to the bathroom.  Daughter or family friends provide his transportation.  He is current with home health RN, PT, and OT through [facility].  Adam Rios with [facility] aware of admission.  RNCM has spoken with patient's daughter, Adam Rios, she would like and update from the doctor.  Urology PA will call the daughter and update.    Expected Discharge Plan: [place] Barriers to Discharge: Continued Medical Work up   Patient Goals and CMS Choice Patient states their goals for this hospitalization and ongoing recovery are:: patient wants to get his prostate problem solved, scheduled for surgery in March at [email or web address] Compare Post Acute Care list provided to:: Patient Choice offered to / list presented to : Patient      Expected Discharge Plan and Services   Discharge Planning Services: CM Consult Post Acute Care Choice: Resumption of Svcs/PTA Provider, Home Health Living arrangements for the past 2 months: Single Family  Home                 DME Arranged: N/A DME Agency: NA       HH Arranged: RN, PT, OT [place] Agency: [place] ([place]) Date HH Agency Contacted: 06/11/22 Time [place]: 1200 Representative spoke with at [place]: [place] Arrangements/Services Living arrangements for the past 2 months: [place] with:: Adult Children Patient language and need for interpreter reviewed:: Yes Do you feel safe going back to the place where you live?: Yes      Need for Family Participation in Patient Care: Yes (Comment) Care giver support system in place?: Yes (comment) Current home services: DME (rollator) Criminal Activity/Legal Involvement Pertinent to Current Situation/Hospitalization: No - Comment as needed  Activities of Daily Living Home Assistive Devices/Equipment: Environmental consultant (specify type), Eyeglasses ADL Screening (condition at time of admission) Patient's cognitive ability adequate to safely complete daily activities?: Yes Is the patient deaf or have difficulty hearing?: No Does the patient have difficulty seeing, even when wearing glasses/contacts?: No Does the patient have difficulty concentrating, remembering, or making decisions?: No Patient able to express need for assistance with ADLs?: Yes Does the patient have difficulty dressing or bathing?: No Independently performs ADLs?: Yes (appropriate for developmental age) Does the patient have difficulty walking or climbing stairs?: Yes Weakness of Legs: Both Weakness of Arms/Hands: None  Permission Sought/Granted Permission sought to share information  with : Case Manager, Family Supports Permission granted to share information with : Yes, Verbal Permission Granted  Share Information with NAME: Adam Rios  Permission granted to share info w AGENCY: [facility]  Permission granted to share info w Relationship: daughter  Permission granted to share info w Contact Information:  [phone]  Emotional Assessment Appearance:: Appears stated age Attitude/Demeanor/Rapport: Engaged Affect (typically observed): Accepting Orientation: : Oriented to Self, Oriented to Place, Oriented to  Time, Oriented to Situation Alcohol / Substance Use: Not Applicable Psych Involvement: No (comment)  Admission diagnosis:  Acute hemorrhagic cystitis [N30.01] Patient Active Problem List   Diagnosis Date Noted   Acute hemorrhagic cystitis 06/11/2022   Iron deficiency anemia due to chronic blood loss 06/11/2022   Dyslipidemia 06/11/2022   GERD without esophagitis 06/11/2022   Lower extremity cellulitis 06/11/2022   Heart block AV complete ([place]) 05/03/2022   Second degree heart block 05/02/2022   Acute on chronic congestive heart failure ([place]) 04/30/2022   Acute on chronic diastolic CHF (congestive heart failure) ([place]) 04/30/2022   Obesity (BMI 30-39.9) 04/30/2022   Chronic indwelling Foley catheter 04/30/2022   Septic shock ([place]) 02/11/2022   Pressure injury of skin 02/11/2022   Bacteremia 11/22/2021   Hematuria 11/21/2021   Renal cell carcinoma s/p nephrectomy, left 11/21/2021   History of recurrent UTI and ESBL UTI  11/21/2021   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   UTI (urinary tract infection) 09/09/2021   Neuropathy 09/09/2021   History of CVA (cerebrovascular accident) 09/09/2021   Fall 05/14/2021   Frailty 03/04/2018   Acute encephalopathy 01/30/2018   Chronic kidney disease 04/05/2017   GERD (gastroesophageal reflux disease) 04/05/2017   Pain 04/05/2017   Weakness 04/05/2017   Recurrent UTI 01/20/2016   Essential hypertension 07/31/2014   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 04/10/2014   Ventral hernia without obstruction or gangrene 04/10/2014   Acute low back pain without sciatica 12/26/2013   Fusion of spine of lumbar region 12/26/2013   Anasarca 09/26/2013   Hemorrhoid 09/26/2013   Neurogenic bladder 09/26/2013   Renal cell  cancer ([place]) 09/26/2013   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   Lower urinary tract infectious disease 10/28/2012   PCP:  Dola Argyle, MD Pharmacy:   [facility] [phone] - [address], [place] AT [facility] OF [address] [place] [address] Phone: [phone] Fax: [phone]     Social Determinants of Health (SDOH) Social History: SDOH Screenings   Food Insecurity: No Food Insecurity (06/11/2022)  Housing: Low Risk  (06/11/2022)  Transportation Needs: No Transportation Needs (06/11/2022)  Utilities: Not At Risk (06/11/2022)  Tobacco Use: Medium Risk (06/10/2022)   SDOH Interventions:     Readmission Risk Interventions    02/12/2022    2:17 PM  Readmission Risk Prevention Plan  Transportation Screening Complete  PCP or Specialist Appt within 3-5 Days Complete  HRI or [place] Complete  Social Work Consult for [place] Planning/Counseling Complete  Palliative Care Screening Not Applicable  Medication Review Press photographer) Referral to Pharmacy

## 2022-06-11 NOTE — Progress Notes (Signed)
Urine remains clear yellow off CBI this afternoon. OK to d/c CBI; will place orders to do so.  He has questions about HOLEP surgery and is wondering if he should pursue this with [place]. We discussed that his gross hematuria has resolved, so HOLEP would be on an elective basis at this point. Charity care at [facility] would offer more robust financial support than we can offer at [facility], so we recommend keeping plans for surgery with them given his ongoing financial concerns. Dr. Diamantina Providence will reach out to Dr. Dayle Points to see if his OR date may be moved up in light of his recurrent hospitalizations.

## 2022-06-11 NOTE — Assessment & Plan Note (Addendum)
-   The patient will be admitted to a medical bed. - This is likely the cause of hematuria. - We will continue antibiotic therapy with IV meropenem as both E. coli and Pseudomonas were sensitive to imipenem. - We will continue CBI given his urinary retention. - Urology consult to be obtained. - Dr. Erlene Quan was notified about the patient.

## 2022-06-11 NOTE — ED Notes (Signed)
This nurse assisted patient with dinner tray, patient sitting up eating at this time. Patient denies any other needs at this time.

## 2022-06-11 NOTE — ED Provider Notes (Signed)
-----------------------------------------   1:14 AM on 06/11/2022 -----------------------------------------   Greater than 500 cc on repeat bladder scan.  Patient will be moved to the major side of the emergency department for lab work, CBI and admission.   ----------------------------------------- 2:04 AM on 06/11/2022 -----------------------------------------   Complete relief after insertion of three-way stopcock.  CBI in progress with red-tinged urine.  Tells me he was called yesterday to change his antibiotic for UTI a bit pharmacy did not have it yet.  I cannot find a record of it but found urine culture from 06/06/2022.  Will administer IV Rocephin.   ----------------------------------------- 2:37 AM on 06/11/2022 -----------------------------------------   H/H 11.8/37.7  compared with 13.2/42.5 just 5 days ago. Stable creatinine. Will consult hospitalist services for evaluation and admission.   Paulette Blanch, MD 06/11/22 [phone]

## 2022-06-12 LAB — BASIC METABOLIC PANEL
Anion gap: 8 (ref 5–15)
BUN: 18 mg/dL (ref 8–23)
CO2: 25 mmol/L (ref 22–32)
Calcium: 8.4 mg/dL — ABNORMAL LOW (ref 8.9–10.3)
Chloride: 101 mmol/L (ref 98–111)
Creatinine, Ser: 1.1 mg/dL (ref 0.61–1.24)
GFR, Estimated: >60 mL/min (ref >60)
Glucose, Bld: 99 mg/dL (ref 70–99)
Potassium: 3.6 mmol/L (ref 3.5–5.1)
Sodium: 134 mmol/L — ABNORMAL LOW (ref 135–145)

## 2022-06-12 LAB — CBC
HCT: 36.8 % — ABNORMAL LOW (ref 39.0–52.0)
Hemoglobin: 11.5 g/dL — ABNORMAL LOW (ref 13.0–17.0)
MCH: 26.1 pg (ref 26.0–34.0)
MCHC: 31.3 g/dL (ref 30.0–36.0)
MCV: 83.4 fL (ref 80.0–100.0)
Platelets: 197 10*3/uL (ref 150–400)
RBC: 4.41 MIL/uL (ref 4.22–5.81)
RDW: 14.9 % (ref 11.5–15.5)
WBC: 7.6 10*3/uL (ref 4.0–10.5)
nRBC: 0 % (ref 0.0–0.2)

## 2022-06-12 LAB — URINE CULTURE: Culture: NO GROWTH

## 2022-06-12 NOTE — Evaluation (Signed)
Physical Therapy Evaluation Patient Details Name: Adam Rios. MRN: [id number] DOB: 04/26/1941 Today's Date: 06/12/2022  History of Present Illness  Adam Rios. is a 82 y.o. Caucasian male with medical history significant for hypertension, neurogenic bladder, CVA, cognitive impairment, who presented to the emergency room with acute onset urinary retention sepsis Foley catheter with replaced around 4 PM.  He admitted to chills without fever.  He was recently treated for UTI which led him to go to Pseudomonas aeruginosa in addition to a E. coli.   Clinical Impression  Patient received in bed, he is pleasant. Agrees to PT/OT assessment. Patient required max A +2 to get seated edge of bed. Required mod +2 assist to stand from elevated bed, mod A +2 to stand from low BSC. He is able to take pivoting steps to and from [facility] with min A +2. Patient will continue to benefit from skilled PT to improve functional independence and safety with mobility.        Recommendations for follow up therapy are one component of a multi-disciplinary discharge planning process, led by the attending physician.  Recommendations may be updated based on patient status, additional functional criteria and insurance authorization.  Follow Up Recommendations Skilled nursing-short term rehab (<3 hours/day) Can patient physically be transported by private vehicle: No    Assistance Recommended at Discharge Frequent or constant Supervision/Assistance  Patient can return home with the following  A lot of help with walking and/or transfers;A lot of help with bathing/dressing/bathroom;Help with stairs or ramp for entrance;Assist for transportation    Equipment Recommendations None recommended by PT  Recommendations for Other Services       Functional Status Assessment Patient has had a recent decline in their functional status and demonstrates the ability to make significant improvements in function in a reasonable and  predictable amount of time.     Precautions / Restrictions Precautions Precautions: Fall Precaution Comments: chronic foley Restrictions Weight Bearing Restrictions: No      Mobility  Bed Mobility Overal bed mobility: Needs Assistance Bed Mobility: Supine to Sit, Sit to Supine     Supine to sit: Max assist, +2 for physical assistance, HOB elevated Sit to supine: Max assist, +2 for physical assistance, HOB elevated        Transfers Overall transfer level: Needs assistance Equipment used: Rolling walker (2 wheels) Transfers: Sit to/from Stand, Bed to chair/wheelchair/BSC Sit to Stand: Mod assist, +2 physical assistance, +2 safety/equipment, From elevated surface   Step pivot transfers: Min assist, +2 physical assistance, +2 safety/equipment       General transfer comment: patient able to take pivoting steps from bed to [facility] and back to bed    Ambulation/Gait                  Stairs            Wheelchair Mobility    Modified Rankin (Stroke Patients Only)       Balance Overall balance assessment: Needs assistance Sitting-balance support: Feet supported Sitting balance-Leahy Scale: Good     Standing balance support: Bilateral upper extremity supported, During functional activity, Reliant on assistive device for balance Standing balance-Leahy Scale: Poor Standing balance comment: dependent on B UE support and assistance for safety                             Pertinent Vitals/Pain Pain Assessment Pain Assessment: Faces Pain Score: 7  Faces Pain  Scale: Hurts little more Pain Location: B feet Pain Descriptors / Indicators: Discomfort, Grimacing, Guarding, Tender Pain Intervention(s): Monitored during session, Repositioned    Home Living Family/patient expects to be discharged to:: Private residence Living Arrangements: Children Available Help at Discharge: Family;Available 24 hours/day Type of Home: House Home Access: Stairs to  enter   [employer] of Steps: 1 partial step   Home Layout: Able to live on main level with bedroom/bathroom Home Equipment: Air cabin crew (4 wheels);Wheelchair - manual;Grab bars - toilet;Grab bars - tub/shower Additional Comments: lives with daughter, son in [place] and grand children    Prior Function Prior Level of Function : Needs assist             Mobility Comments: Pt reports he sleeps in a lift chair but is able to ambulate to/from bathroom without assistance, uses doorframe to negotiate single step into house. ADLs Comments: assist for bathing/LBD- has home health     Hand Dominance   Dominant Hand: Right    Extremity/Trunk Assessment   Upper Extremity Assessment Upper Extremity Assessment: Defer to OT evaluation    Lower Extremity Assessment Lower Extremity Assessment: Generalized weakness (B lower legs/feet red.)    Cervical / Trunk Assessment Cervical / Trunk Assessment: Back Surgery  Communication   Communication: No difficulties  Cognition Arousal/Alertness: Awake/alert Behavior During Therapy: WFL for tasks assessed/performed Overall Cognitive Status: Within Functional Limits for tasks assessed                                          General Comments      Exercises     Assessment/Plan    PT Assessment Patient needs continued PT services  PT Problem List Decreased strength;Decreased activity tolerance;Decreased balance;Decreased mobility;Pain;Obesity       PT Treatment Interventions DME instruction;Therapeutic exercise;Gait training;Balance training;Functional mobility training;Therapeutic activities;Patient/family education;Neuromuscular re-education    PT Goals (Current goals can be found in the Care Plan section)  Acute Rehab PT Goals Patient Stated Goal: to go back home PT Goal Formulation: With patient Time For Goal Achievement: 06/26/22 Potential to Achieve Goals: Fair    Frequency Min 2X/week      Co-evaluation PT/OT/SLP Co-Evaluation/Treatment: Yes Reason for Co-Treatment: For patient/therapist safety;To address functional/ADL transfers PT goals addressed during session: Mobility/safety with mobility;Balance;Proper use of DME         AM-PAC PT "6 Clicks" Mobility  Outcome Measure Help needed turning from your back to your side while in a flat bed without using bedrails?: Total Help needed moving from lying on your back to sitting on the side of a flat bed without using bedrails?: Total Help needed moving to and from a bed to a chair (including a wheelchair)?: A Lot Help needed standing up from a chair using your arms (e.g., wheelchair or bedside chair)?: A Lot Help needed to walk in hospital room?: Total Help needed climbing 3-5 steps with a railing? : Total 6 Click Score: 8    End of Session Equipment Utilized During Treatment: Gait belt Activity Tolerance: Patient limited by fatigue;Patient limited by pain Patient left: in bed;with call bell/phone within reach;with bed alarm set Nurse Communication: Mobility status PT Visit Diagnosis: Other abnormalities of gait and mobility (R26.89);Muscle weakness (generalized) (M62.81);Difficulty in walking, not elsewhere classified (R26.2);Pain Pain - Right/Left:  (B) Pain - part of body: Leg;Ankle and joints of foot    Time: 7948-0165 PT Time  Calculation (min) (ACUTE ONLY): 37 min   Charges:   PT Evaluation $PT Eval Moderate Complexity: 1 Mod PT Treatments $Therapeutic Activity: 8-22 mins        Aneisha Skyles, PT, GCS 06/12/22,10:52 AM

## 2022-06-12 NOTE — Evaluation (Signed)
Occupational Therapy Evaluation Patient Details Name: Adam Rios. MRN: [id number] DOB: 1940/08/14 Today's Date: 06/12/2022   History of Present Illness Adam Rios. is a 82 y.o. Caucasian male with medical history significant for hypertension, neurogenic bladder, CVA, cognitive impairment, who presented to the emergency room with acute onset urinary retention sepsis Foley catheter with replaced around 4 PM.  He admitted to chills without fever.  He was recently treated for UTI which led him to go to Pseudomonas aeruginosa in addition to a E. coli.   Clinical Impression   Adam Rios presents with generalized weakness, limited endurance, impaired balance, and pain. PTA, pt lives with his daughter, SIL, and grandchildren, is able to complete some ADL with Mod I, receives assistance from family and [facility] aides for bathing and for IADL. He ambulates at baseline with a rollator and sleeps in a lift chair. Given level of assistance pt requires this date, session is conducted jointly by OT and PT. Pt requires Max +2 to transfer supine<>sit, Mod-Max A +2 for sit<>stand, Min-Mod A +2 for transferring to/from [facility], Max A for pericare, Max A for LB dressing. Pt endorses 7/10 pain in b/l calves/feet. Pt is far from his baseline level of fxl mobility at present. Recommend ongoing OT while hospitalized, with DC to SNF to continue to build strength, endurance, and balance.    Recommendations for follow up therapy are one component of a multi-disciplinary discharge planning process, led by the attending physician.  Recommendations may be updated based on patient status, additional functional criteria and insurance authorization.   Follow Up Recommendations  Skilled nursing-short term rehab (<3 hours/day)     Assistance Recommended at Discharge    Patient can return home with the following A lot of help with walking and/or transfers;A lot of help with bathing/dressing/bathroom;Assistance with  cooking/housework    Functional Status Assessment  Patient has had a recent decline in their functional status and demonstrates the ability to make significant improvements in function in a reasonable and predictable amount of time.  Equipment Recommendations  None recommended by OT    Recommendations for Other Services       Precautions / Restrictions Precautions Precautions: Fall Precaution Comments: chronic foley Restrictions Weight Bearing Restrictions: No      Mobility Bed Mobility Overal bed mobility: Needs Assistance Bed Mobility: Supine to Sit, Sit to Supine     Supine to sit: Max assist, +2 for physical assistance, HOB elevated Sit to supine: Max assist, +2 for physical assistance, HOB elevated        Transfers Overall transfer level: Needs assistance Equipment used: Rolling walker (2 wheels) Transfers: Sit to/from Stand, Bed to chair/wheelchair/BSC Sit to Stand: Mod assist, +2 physical assistance, +2 safety/equipment, From elevated surface     Step pivot transfers: Mod assist, +2 physical assistance, +2 safety/equipment     General transfer comment: patient able to take pivoting steps from bed to [facility] and back to bed      Balance Overall balance assessment: Needs assistance Sitting-balance support: Feet supported Sitting balance-Leahy Scale: Good     Standing balance support: Bilateral upper extremity supported, During functional activity, Reliant on assistive device for balance Standing balance-Leahy Scale: Poor Standing balance comment: dependent on B UE support and assistance for safety                           ADL either performed or assessed with clinical judgement   ADL Overall  ADL's : Needs assistance/impaired                     Lower Body Dressing: Maximal assistance Lower Body Dressing Details (indicate cue type and reason): donning/doffing socks. Pt reports using a sock aide at home Toilet Transfer: Maximal  assistance;Rolling walker (2 wheels);BSC/3in1;+2 for physical assistance   Toileting- Clothing Manipulation and Hygiene: Maximal assistance;Sit to/from stand Toileting - Clothing Manipulation Details (indicate cue type and reason): pericare             Vision         Perception     Praxis      Pertinent Vitals/Pain Pain Assessment Pain Score: 7  Pain Location: b/l feet, LE Pain Descriptors / Indicators: Discomfort, Grimacing, Guarding, Tender Pain Intervention(s): Limited activity within patient's tolerance, Repositioned, Monitored during session     Hand Dominance Right   Extremity/Trunk Assessment Upper Extremity Assessment Upper Extremity Assessment: Generalized weakness   Lower Extremity Assessment Lower Extremity Assessment: Generalized weakness   Cervical / Trunk Assessment Cervical / Trunk Assessment: Back Surgery   Communication Communication Communication: No difficulties   Cognition Arousal/Alertness: Awake/alert Behavior During Therapy: WFL for tasks assessed/performed Overall Cognitive Status: Within Functional Limits for tasks assessed                                       General Comments  edema and redness, b/l LE; foley cath; recent pacemaker insertion    Exercises Other Exercises Other Exercises: Educ re: safe use of RW, role of OT, DC recs   Shoulder Instructions      Home Living Family/patient expects to be discharged to:: Private residence Living Arrangements: Children Available Help at Discharge: Family;Available 24 hours/day Type of Home: House Home Access: Stairs to enter [employer] of Steps: 1 partial step Entrance Stairs-Rails: None Home Layout: Able to live on main level with bedroom/bathroom     Bathroom Shower/Tub: Walk-in shower         Home Equipment: Air cabin crew (4 wheels);Wheelchair - manual;Grab bars - toilet;Grab bars - tub/shower   Additional Comments: lives with daughter,  son in Sports coach and grand children      Prior Functioning/Environment Prior Level of Function : Needs assist             Mobility Comments: Pt reports he sleeps in a lift chair but is able to ambulate to/from bathroom without assistance, uses doorframe to negotiate single step into house. ADLs Comments: assist for bathing/LBD- has home health        OT Problem List: Decreased strength;Decreased range of motion;Decreased activity tolerance;Impaired balance (sitting and/or standing);Pain;Increased edema      OT Treatment/Interventions: Self-care/ADL training;Therapeutic exercise;Patient/family education;Balance training;Energy conservation;Therapeutic activities;DME and/or AE instruction    OT Goals(Current goals can be found in the care plan section) Acute Rehab OT Goals Patient Stated Goal: to go home OT Goal Formulation: With patient Time For Goal Achievement: 06/26/22 Potential to Achieve Goals: Good ADL Goals Pt Will Perform Grooming: standing;sitting;with min assist Pt Will Transfer to Toilet: with min assist;ambulating Pt Will Perform Tub/Shower Transfer: with mod assist;shower seat  OT Frequency: Min 2X/week    Co-evaluation   Reason for Co-Treatment: For patient/therapist safety;To address functional/ADL transfers PT goals addressed during session: Mobility/safety with mobility;Balance;Proper use of DME        AM-PAC OT "6 Clicks" Daily Activity     Outcome  Measure Help from another person eating meals?: None Help from another person taking care of personal grooming?: A Little Help from another person toileting, which includes using toliet, bedpan, or urinal?: A Lot Help from another person bathing (including washing, rinsing, drying)?: A Lot Help from another person to put on and taking off regular upper body clothing?: A Little Help from another person to put on and taking off regular lower body clothing?: A Lot 6 Click Score: 16   End of Session Equipment  Utilized During Treatment: Rolling walker (2 wheels)  Activity Tolerance: Patient tolerated treatment well Patient left: in bed;with call bell/phone within reach;with bed alarm set  OT Visit Diagnosis: Unsteadiness on feet (R26.81);Other abnormalities of gait and mobility (R26.89);Muscle weakness (generalized) (M62.81);Pain Pain - part of body: Leg                Time: 4709-6283 OT Time Calculation (min): 35 min Charges:  OT General Charges $OT Visit: 1 Visit OT Evaluation $OT Eval Low Complexity: 1 Low OT Treatments $Self Care/Home Management : 8-22 mins Josiah Lobo, PhD, MS, OTR/L 06/12/22, 12:10 PM

## 2022-06-12 NOTE — Progress Notes (Signed)
Mobility Specialist - Progress Note   06/12/22 1537  Mobility  Activity Transferred from bed to chair;Ambulated with assistance in room  Level of Assistance Moderate assist, patient does 50-74%  Assistive Device Front wheel walker  Distance Ambulated (ft) 4 ft  Activity Response Tolerated well  $Mobility charge 1 Mobility   Pt supine upon entry, utilizing RA. Pt completed bed mob, MaxA +2 for BLE and trunk support to bring upright for transfer EOB. Pt STS to RW ModA +2, Min VC for hand placement and sequencing. Pt transferred to recliner with extra effort for gait, weight-bearing through UE. Pt left sitting in recliner with alarm set and needs within reach.   Candie Mile Mobility Specialist 06/12/22 3:44 PM

## 2022-06-13 MED ORDER — CHLORHEXIDINE GLUCONATE CLOTH 2 % EX PADS
6.0000 | MEDICATED_PAD | Freq: Every day | CUTANEOUS | Status: DC
  Administered 2022-06-13 – 2022-06-14 (×2): 6 via TOPICAL

## 2022-06-13 NOTE — Progress Notes (Signed)
[facility] PROGRESS NOTE  Adam Rios. ASN:[id number] DOB: [birth year] DOA: 06/10/2022 PCP: Dola Argyle, MD  Status is: Inpatient Remains inpatient appropriate because:  generalized weakness, unable to walk  Barriers to discharge: Social:  Clinical:  Level of care: Telemetry Medical   Code Status:   Full Family Communication:  daughter DVT prophylaxis:  Lovenox COVID vaccination status:  Foley catheter: yes POA: daughter Date inserted: chronic  HPI:  Subjective:  Complaining of generalized weakness, lower extremity swelling  Objective: Vitals:   06/13/22 0303 06/13/22 0914  BP: 117/60 120/74  Pulse: 66 80  Resp: 16 20  Temp: 97.7 F (36.5 C) 98.5 F (36.9 C)  SpO2: 93% 94%    Intake/Output Summary (Last 24 hours) at 06/13/2022 0923 Last data filed at 06/13/2022 0916 Gross per 24 hour  Intake 3596.23 ml  Output 3600 ml  Net -3.77 ml   Filed Weights   06/11/22 1900  Weight: 107.5 kg    Exam:  Constitutional: NAD, calm, comfortable Eyes: PERRL, lids and conjunctivae normal ENMT: Mucous membranes are moist. Posterior pharynx clear of any exudate or lesions.Normal dentition.  Neck: normal, supple, no masses, no thyromegaly Respiratory: clear to auscultation bilaterally, no wheezing, no crackles. Normal respiratory effort. No accessory muscle use.  Cardiovascular: Regular rate and rhythm, no murmurs / rubs / gallops. No extremity edema. 2+ pedal pulses. No carotid bruits.  Abdomen: no tenderness, no masses palpated. No hepatosplenomegaly. Bowel sounds positive.  Musculoskeletal: no clubbing / cyanosis. No joint deformity upper and lower extremities. Good ROM, no contractures. Normal muscle tone.  Skin: no rashes, lesions, ulcers. No induration Neurologic: CN 2-12 grossly intact. Sensation intact, DTR normal. Strength 5/5 x all 4 extremities.  Psychiatric: Normal judgment and insight. Alert and oriented x 3. Normal mood.     Assessment/Plan: 82 year old male with history of hypertension, neurogenic bladder, CVA, cognitive impairment presented to the emergency department with acute onset of urinary retention, Foley catheter was replaced and was discharged home.  Patient noticed bloody urine, CBI was started.  Patient also experienced severe suprapubic pain.  Urology was consulted.  Patient's urine more cleared.  Continues to have severe generalized weakness, increased lower extremity edema.  Patient is hard of hearing.  Patient is also found to have lower extremity redness, swelling. Other problems:  * Acute hemorrhagic cystitis - The patient will be admitted to a medical bed. - This is likely the cause of hematuria. - We will continue antibiotic therapy with IV meropenem as both E. coli and Pseudomonas were sensitive to imipenem. -  s/p CBI given his urinary retention. -  urology cleared patient to be discharged home, recommended patient to follow up as an outpatient.    Lower extremity cellulitis - This should be covered with IV meropenem, vancomycin. - Warm compresses will be applied. - Pain management will be provided. 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  Despite explaining the patient the importance of going to SNF, patient is quite firm about his decision to go home with home health.   Iron deficiency anemia due to chronic blood loss - We will follow serial hemoglobins and hematocrits. - This is more acute and clearly secondary to his hematuria. - Aspirin will be held off.   BPH with obstruction/lower urinary tract symptoms - We will continue Flomax and Proscar.   GERD without esophagitis - We will continue PPI therapy.  Data Reviewed: Basic Metabolic Panel: Recent Labs  Lab 06/06/22 1620 06/11/22 0215 06/12/22 0319  NA 137 136 134*  K 3.5 3.5 3.6   CL 101 99 101  CO2 '27 26 25  '$ GLUCOSE 102* 115* 99  BUN 24* 24* 18  CREATININE 1.28* 1.26* 1.10  CALCIUM 8.9 8.4* 8.4*   Liver Function Tests: No results for input(s): "AST", "ALT", "ALKPHOS", "BILITOT", "PROT", "ALBUMIN" in the last 168 hours. No results for input(s): "LIPASE", "AMYLASE" in the last 168 hours. No results for input(s): "AMMONIA" in the last 168 hours. CBC: Recent Labs  Lab 06/06/22 1620 06/11/22 0215 06/12/22 0319  WBC 9.0 8.7 7.6  NEUTROABS  --  5.9  --   HGB 13.2 11.8* 11.5*  HCT 42.5 37.7* 36.8*  MCV 86.0 84.7 83.4  PLT 221 207 197   Cardiac Enzymes: No results for input(s): "CKTOTAL", "CKMB", "CKMBINDEX", "TROPONINI" in the last 168 hours. BNP (last 3 results) Recent Labs    09/08/21 0110 01/01/22 1720 04/29/22 1642  BNP 85.9 71.9 119.6*    ProBNP (last 3 results) No results for input(s): "PROBNP" in the last 8760 hours.  CBG: No results for input(s): "GLUCAP" in the last 168 hours.  Recent Results (from the past 240 hour(s))  Urine Culture     Status: Abnormal   Collection Time: 06/06/22  4:20 PM   Specimen: Urine, Clean Catch  Result Value Ref Range Status   Specimen Description   Final    URINE, CLEAN CATCH Performed at [facility], [address] [place], [place] [address]    Special Requests   Final    NONE Performed at [facility], [place], [place] [address]    Culture (A)  Final    >=100,000 COLONIES/mL PSEUDOMONAS AERUGINOSA >=100,000 COLONIES/mL ESCHERICHIA COLI    Report Status 06/09/2022 FINAL  Final   Organism ID, Bacteria PSEUDOMONAS AERUGINOSA (A)  Final   Organism ID, Bacteria ESCHERICHIA COLI (A)  Final      Susceptibility   Escherichia coli - MIC*    AMPICILLIN <=2 SENSITIVE Sensitive     CEFAZOLIN <=4 SENSITIVE Sensitive     CEFEPIME <=0.12 SENSITIVE Sensitive     CEFTRIAXONE <=0.25 SENSITIVE Sensitive     CIPROFLOXACIN >=4 RESISTANT Resistant     GENTAMICIN <=1 SENSITIVE  Sensitive     IMIPENEM <=0.25 SENSITIVE Sensitive     NITROFURANTOIN <=16 SENSITIVE Sensitive     TRIMETH/SULFA >=320 RESISTANT Resistant     AMPICILLIN/SULBACTAM <=2 SENSITIVE Sensitive     PIP/TAZO <=4 SENSITIVE Sensitive     * >=100,000 COLONIES/mL ESCHERICHIA COLI   Pseudomonas aeruginosa - MIC*    CEFTAZIDIME 4 SENSITIVE Sensitive     CIPROFLOXACIN <=0.25 SENSITIVE Sensitive     GENTAMICIN <=1 SENSITIVE Sensitive     IMIPENEM 2 SENSITIVE Sensitive     * >=100,000 COLONIES/mL PSEUDOMONAS AERUGINOSA  MRSA Next Gen by PCR, Nasal     Status: None   Collection Time: 06/11/22  1:39 PM   Specimen: Nasal Mucosa; Nasal Swab  Result Value Ref Range Status   MRSA by PCR Next Gen NOT DETECTED NOT DETECTED Final    Comment: (NOTE) The GeneXpert MRSA Assay (FDA approved for NASAL specimens only), is one component of a comprehensive MRSA colonization surveillance program. It is not intended to diagnose MRSA infection nor to guide or monitor treatment for MRSA infections. Test performance is not FDA approved in patients less than 53 years old. Performed at [facility], [address], [place], [place] [address]   Urine Culture     Status: None   Collection Time: 06/11/22  8:00 PM   Specimen: Urine, Catheterized  Result Value Ref Range Status   Specimen Description   Final    URINE, CATHETERIZED Performed at [facility], [address], [place], [place] [address]    Special Requests   Final    NONE Performed at [facility], [address], [place], [place] [address]    Culture   Final    NO GROWTH Performed at [facility], [place] [address], [place], [place] [address]    Report Status 06/12/2022 FINAL  Final     Studies: US PELVIS LIMITED (TRANSABDOMINAL ONLY)  Result Date: 06/11/2022 CLINICAL DATA:  Gross hematuria. Status post bladder irrigation today. EXAM: LIMITED ULTRASOUND OF PELVIS TECHNIQUE: Limited transabdominal ultrasound  examination of the pelvis was performed. COMPARISON:  Abdomen and pelvis CT dated 04/30/2022. FINDINGS: Small amount of urine in the urinary bladder with a Foley catheter in place. Again demonstrated is marked enlargement and heterogeneity of the prostate gland. IMPRESSION: 1. Decompressed urinary bladder with a Foley catheter in place. 2. Stable markedly enlarged and heterogeneous prostate gland. Electronically Signed   By: Claudie Revering M.D.   On: 06/11/2022 09:40    Scheduled Meds:  atorvastatin  40 mg Oral Daily   Chlorhexidine Gluconate Cloth  6 each Topical Q0600   cyanocobalamin  1,000 mcg Oral Daily   donepezil  10 mg Oral QHS   finasteride  5 mg Oral Daily   furosemide  40 mg Intravenous Q12H   senna-docusate  1 tablet Oral BID   tamsulosin  0.4 mg Oral Daily   traZODone  50-100 mg Oral QHS   Continuous Infusions:  sodium chloride 100 mL/hr at 06/13/22 0515   meropenem (MERREM) IV Stopped (06/12/22 2331)    Principal Problem:   Acute hemorrhagic cystitis Active Problems:   Lower extremity cellulitis   BPH with obstruction/lower urinary tract symptoms   Iron deficiency anemia due to chronic blood loss   Essential hypertension   Dyslipidemia   GERD without esophagitis   Consultants: Urology  Procedures: CBI  Antibiotics: Meropenem 1/10   Time spent: 40 min    Adam Rios ANP  [facility] 7 am - 330 pm/M-F for direct patient care and secure chat Please refer to [place] for contact info

## 2022-06-14 MED ORDER — GABAPENTIN 300 MG PO CAPS: 300.0000 mg | ORAL_CAPSULE | Freq: Every day | ORAL | Status: DC

## 2022-06-14 NOTE — Progress Notes (Signed)
Patient discharge with foley intact and yellow dark urine flowing. Both PIVs removed, AVS discussed with patient and daughter. All personal belongings returned. Patient Discharged home with daughter to transport. End note.

## 2022-06-14 NOTE — TOC Transition Note (Signed)
Transition of Care [facility]) - CM/SW Discharge Note   Patient Details  Name: Adam Rios. MRN: [id number] Date of Birth: 06-08-1940  Transition of Care [facility]) CM/SW Contact:  Magnus Ivan, LCSW Phone Number: 06/14/2022, 10:26 AM   Clinical Narrative:    Clabe Seal with [facility] that patient is discharging home today per MD. Asked MD for [facility] orders.   Final next level of care: Home w Home Health Services Barriers to Discharge: Barriers Resolved   Patient Goals and CMS Choice CMS Medicare.gov Compare Post Acute Care list provided to:: Patient Represenative (must comment) Choice offered to / list presented to : Adult Children  Discharge Placement                    Name of family member notified: [name] Patient and family notified of of transfer: 06/14/22  Discharge Plan and Services Additional resources added to the After Visit Summary for     Discharge Planning Services: CM Consult Post Acute Care Choice: Resumption of Svcs/PTA Provider, Home Health          DME Arranged: N/A DME Agency: NA       HH Arranged: RN, PT, OT, Nurse's Aide [place] Agency: [place] ([facility]) Date HH Agency Contacted: 06/14/22 Time [place]: 1200 Representative spoke with at [place]: [place] ([place]) Interventions SDOH Screenings   Food Insecurity: No Food Insecurity (06/11/2022)  Housing: Low Risk  (06/11/2022)  Transportation Needs: No Transportation Needs (06/11/2022)  Utilities: Not At Risk (06/11/2022)  Tobacco Use: Medium Risk (06/10/2022)     Readmission Risk Interventions    06/11/2022    2:19 PM 02/12/2022    2:17 PM  Readmission Risk Prevention Plan  Transportation Screening Complete Complete  PCP or Specialist Appt within 3-5 Days Complete Complete  HRI or [place] Complete Complete  Social Work Consult for [place] Planning/Counseling Complete Complete  Palliative Care Screening Not  Applicable Not Applicable  Medication Review Press photographer) Complete Referral to Pharmacy

## 2022-06-14 NOTE — TOC Progression Note (Signed)
Transition of Care (TOC) - Progression Note    Patient Details  Name: Adam Rios. MRN: [id number] Date of Birth: Apr 20, 1941  Transition of Care [facility]) CM/SW [place], LCSW Phone Number: 06/14/2022, 10:09 AM  Clinical Narrative:    PT/OT recommends SNF. Attempted call into patient's room, no answer. Called daughter [name] who states patient declines SNF and will return home with [facility]. [name] requested update from MD, MD notified.    Expected Discharge Plan: [place] Barriers to Discharge: Continued Medical Work up  Expected Discharge Plan and Services   Discharge Planning Services: CM Consult Post Acute Care Choice: Resumption of Svcs/PTA Provider, Home Health Living arrangements for the past 2 months: Single Family Home                 DME Arranged: N/A DME Agency: NA       HH Arranged: RN, PT, OT HH Agency: [place] ([facility]) Date HH Agency Contacted: 06/11/22 Time [place]: 1200 Representative spoke with at [place]: [place] ([place]) Interventions SDOH Screenings   Food Insecurity: No Food Insecurity (06/11/2022)  Housing: Low Risk  (06/11/2022)  Transportation Needs: No Transportation Needs (06/11/2022)  Utilities: Not At Risk (06/11/2022)  Tobacco Use: Medium Risk (06/10/2022)    Readmission Risk Interventions    06/11/2022    2:19 PM 02/12/2022    2:17 PM  Readmission Risk Prevention Plan  Transportation Screening Complete Complete  PCP or Specialist Appt within 3-5 Days Complete Complete  HRI or [place] Complete Complete  Social Work Consult for [place] Planning/Counseling Complete Complete  Palliative Care Screening Not Applicable Not Applicable  Medication Review Press photographer) Complete Referral to Pharmacy

## 2022-06-14 NOTE — Discharge Summary (Signed)
Physician Discharge Summary   Patient: Adam Rios. MRN: [id number] DOB: 1941-05-15  Admit date:     06/10/2022  Discharge date: 06/14/22  Discharge Physician: VZCHYIFOY,DXAJOIN   PCP: Dola Argyle, MD   Recommendations at discharge:   Follow-up with the primary care physician in 1 week  Discharge Diagnoses: Principal Problem:   Acute hemorrhagic cystitis Active Problems:   Lower extremity cellulitis   BPH with obstruction/lower urinary tract symptoms   Iron deficiency anemia due to chronic blood loss   Essential hypertension   Dyslipidemia   GERD without esophagitis  Resolved Problems:   * No resolved hospital problems. *  History of present illness and Hospital Course  82 year old male with history of hypertension, neurogenic bladder, CVA, cognitive impairment presented to the emergency department with acute onset of urinary retention, Foley catheter was replaced and was discharged home.  Patient noticed bloody urine, CBI was started.  Patient also experienced severe suprapubic pain.  Urology was consulted.  Patient's urine more cleared.  Continues to have severe generalized weakness, increased lower extremity edema.  Patient is hard of hearing.  Patient is also found to have lower extremity redness, swelling. Patient was treated with broad-spectrum antibiotics for lower extremity cellulitis the significant improvement.  Also added Lasix.  Patient was evaluated by physical therapy, recommended skilled nursing facility placement for which patient adamantly declined despite explaining multiple times.  Patient is discharged home with home health with physical therapy, Occupational Therapy.  Recommended patient to follow-up with her primary care physician in 1 week.  Patient will continue with the Foley catheter, patient will need to follow-up with urology as an outpatient.   * Acute hemorrhagic cystitis - The patient will be admitted to a medical bed. - This is likely the  cause of hematuria. - We will continue antibiotic therapy with IV meropenem as both E. coli and Pseudomonas were sensitive to imipenem. -  s/p CBI given his urinary retention. -  urology cleared patient to be discharged home, recommended patient to follow up as an outpatient.     Lower extremity cellulitis - This should be covered with IV meropenem, vancomycin. - Warm compresses will be applied. - Pain management will be provided. 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  Despite explaining the patient the importance of going to SNF, patient is quite firm about his decision to go home with home health.   Iron deficiency anemia due to chronic blood loss - We will follow serial hemoglobins and hematocrits. - This is more acute and clearly secondary to his hematuria. - Aspirin will be held off.   BPH with obstruction/lower urinary tract symptoms - We will continue Flomax and Proscar.   GERD without esophagitis - We will continue PPI therapy.    Urology Consultants: Urology Procedures performed: Foley catheter placement Disposition: Home health Diet recommendation:  Discharge Diet Orders (From admission, onward)     Start     Ordered   06/14/22 0000  Diet - low sodium heart healthy        06/14/22 1018           Cardiac diet DISCHARGE MEDICATION: Allergies as of 06/14/2022       Reactions   Fentanyl Nausea Only   Oxycodone Nausea And Vomiting        Medication List     STOP taking these medications  cefdinir 300 MG capsule Commonly known as: OMNICEF   ciprofloxacin 500 MG tablet Commonly known as: CIPRO       TAKE these medications    acetaminophen 325 MG tablet Commonly known as: TYLENOL Take 500 mg by mouth every 6 (six) hours as needed.   aspirin EC 81 MG tablet Take 81 mg by mouth daily as needed.    atorvastatin 40 MG tablet Commonly known as: LIPITOR Take 40 mg by mouth daily.   [facility] Cortisone Maximum Strength 1 % Generic drug: hydrocortisone cream Apply 1 Application topically as needed for itching.   cyanocobalamin 1000 MCG tablet Commonly known as: VITAMIN B12 Take 1,000 mcg by mouth daily.   diclofenac Sodium 1 % Gel Commonly known as: VOLTAREN Apply 2 g topically 2 (two) times daily as needed.   donepezil 10 MG tablet Commonly known as: ARICEPT Take 10 mg by mouth at bedtime.   finasteride 5 MG tablet Commonly known as: PROSCAR Take 5 mg by mouth daily.   furosemide 20 MG tablet Commonly known as: LASIX Take 20 mg by mouth 2 (two) times daily.   furosemide 20 MG tablet Commonly known as: Lasix Take 2 tablets (40 mg total) by mouth 2 (two) times daily for 3 days.   gabapentin 300 MG capsule Commonly known as: NEURONTIN Take 1 capsule (300 mg total) by mouth at bedtime. What changed: when to take this   lidocaine 5 % Commonly known as: LIDODERM Place 2 patches onto the skin daily as needed.   mirabegron ER 25 MG Tb24 tablet Commonly known as: MYRBETRIQ Take 25 mg by mouth daily.   omeprazole 20 MG capsule Commonly known as: PRILOSEC Take 1 capsule (20 mg total) by mouth daily.   potassium chloride SA 20 MEQ tablet Commonly known as: KLOR-CON M Take 1 tablet (20 mEq total) by mouth 2 (two) times daily for 5 days.   senna-docusate 8.6-50 MG tablet Commonly known as: Senokot-S Take 1 tablet by mouth 2 (two) times daily. What changed:  when to take this reasons to take this   tamsulosin 0.4 MG Caps capsule Commonly known as: FLOMAX Take 0.4 mg by mouth daily.   traZODone 50 MG tablet Commonly known as: DESYREL Take 50-100 mg by mouth at bedtime.        Follow-up Information     Dola Argyle, MD.   Specialty: Family Medicine               Discharge Exam: [facility] Weights   06/11/22 1900  Weight: 107.5 kg     Condition at  discharge: stable  The results of significant diagnostics from this hospitalization (including imaging, microbiology, ancillary and laboratory) are listed below for reference.   Imaging Studies: US PELVIS LIMITED (TRANSABDOMINAL ONLY)  Result Date: 06/11/2022 CLINICAL DATA:  Gross hematuria. Status post bladder irrigation today. EXAM: LIMITED ULTRASOUND OF PELVIS TECHNIQUE: Limited transabdominal ultrasound examination of the pelvis was performed. COMPARISON:  Abdomen and pelvis CT dated 04/30/2022. FINDINGS: Small amount of urine in the urinary bladder with a Foley catheter in place. Again demonstrated is marked enlargement and heterogeneity of the prostate gland. IMPRESSION: 1. Decompressed urinary bladder with a Foley catheter in place. 2. Stable markedly enlarged and heterogeneous prostate gland. Electronically Signed   By: Claudie Revering M.D.   On: 06/11/2022 09:40    Microbiology: Results for orders placed or performed during the hospital encounter of 06/10/22  MRSA Next Gen by PCR, Nasal     Status: None  Collection Time: 06/11/22  1:39 PM   Specimen: Nasal Mucosa; Nasal Swab  Result Value Ref Range Status   MRSA by PCR Next Gen NOT DETECTED NOT DETECTED Final    Comment: (NOTE) The GeneXpert MRSA Assay (FDA approved for NASAL specimens only), is one component of a comprehensive MRSA colonization surveillance program. It is not intended to diagnose MRSA infection nor to guide or monitor treatment for MRSA infections. Test performance is not FDA approved in patients less than 22 years old. Performed at [facility], [address], [place], [place] [address]   Urine Culture     Status: None   Collection Time: 06/11/22  8:00 PM   Specimen: Urine, Catheterized  Result Value Ref Range Status   Specimen Description   Final    URINE, CATHETERIZED Performed at [facility], [address] [place], [place] [address]    Special Requests   Final    NONE Performed  at [facility], [address] [place], [place] [address]    Culture   Final    NO GROWTH Performed at [facility], [place] [address] [place], [place] [address]    Report Status 06/12/2022 FINAL  Final    Labs: CBC: Recent Labs  Lab 06/11/22 0215 06/12/22 0319  WBC 8.7 7.6  NEUTROABS 5.9  --   HGB 11.8* 11.5*  HCT 37.7* 36.8*  MCV 84.7 83.4  PLT 207 275   Basic Metabolic Panel: Recent Labs  Lab 06/11/22 0215 06/12/22 0319  NA 136 134*  K 3.5 3.6  CL 99 101  CO2 26 25  GLUCOSE 115* 99  BUN 24* 18  CREATININE 1.26* 1.10  CALCIUM 8.4* 8.4*   Liver Function Tests: No results for input(s): "AST", "ALT", "ALKPHOS", "BILITOT", "PROT", "ALBUMIN" in the last 168 hours. CBG: No results for input(s): "GLUCAP" in the last 168 hours.  Discharge time spent: greater than 30 minutes.  SignedMonica Becton, MD [facility] 06/14/2022

## 2022-06-15 ENCOUNTER — Emergency Department
Admission: EM | Admit: 2022-06-15 | Discharge: 2022-06-16 | Disposition: A | Discharge status: Home / Self Care | Payer: Medicare Other | Attending: Emergency Medicine | Admitting: Emergency Medicine

## 2022-06-15 DIAGNOSIS — Z85528 Personal history of other malignant neoplasm of kidney: Secondary | ICD-10-CM | POA: Diagnosis not present

## 2022-06-15 DIAGNOSIS — R102 Pelvic and perineal pain: Secondary | ICD-10-CM

## 2022-06-15 DIAGNOSIS — Z79899 Other long term (current) drug therapy: Secondary | ICD-10-CM | POA: Diagnosis not present

## 2022-06-15 DIAGNOSIS — I10 Essential (primary) hypertension: Secondary | ICD-10-CM | POA: Diagnosis not present

## 2022-06-15 DIAGNOSIS — Z7982 Long term (current) use of aspirin: Secondary | ICD-10-CM | POA: Diagnosis not present

## 2022-06-15 DIAGNOSIS — N390 Urinary tract infection, site not specified: Secondary | ICD-10-CM | POA: Insufficient documentation

## 2022-06-15 DIAGNOSIS — R339 Retention of urine, unspecified: Secondary | ICD-10-CM | POA: Diagnosis present

## 2022-06-15 LAB — URINALYSIS, ROUTINE W REFLEX MICROSCOPIC
Bilirubin Urine: NEGATIVE
Glucose, UA: NEGATIVE mg/dL
Ketones, ur: NEGATIVE mg/dL
Nitrite: NEGATIVE
Protein, ur: 100 mg/dL — AB
Specific Gravity, Urine: 1.029 (ref 1.005–1.030)
Squamous Epithelial / HPF: NONE SEEN /HPF (ref 0–5)
pH: 5 (ref 5.0–8.0)

## 2022-06-15 NOTE — ED Triage Notes (Signed)
See First nurse note-  Catheter changed on 1/11. Pt states today has had decreased output.  Pt noted to have minimal amount of urine in bag. Reports last time emptied at 4pm this evening. Pt denies any other c/o Bladder scan obtained in triage

## 2022-06-15 NOTE — ED Provider Notes (Signed)
[facility] Provider Note    Event Date/Time   First MD Initiated Contact with Patient 06/15/22 2307     (approximate)   History   Urinary Retention   HPI  Adam Goytia. is a 82 y.o. male with history of hypertension, stroke, neurogenic bladder who presents emergency department decreased urinary output since he took a nap this afternoon.  States before he went to bed he emptied his catheter bag but has had minimal output since.  States he is starting to have some suprapubic discomfort.  No fever or vomiting.   History provided by patient.    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[place])    Hypertension    Neurogenic bladder    Renal cell carcinoma ([place]) 2005   Renal disorder    Stroke ([place])     01/17/18, 12/21   Some weakness and cognitive decline    Past Surgical History:  Procedure Laterality Date   APPENDECTOMY     CATARACT EXTRACTION W/PHACO Right 07/22/2020   Procedure: CATARACT EXTRACTION PHACO AND INTRAOCULAR LENS PLACEMENT ([place]) RIGHT;  Surgeon: Eulogio Bear, MD;  Location: [place];  Service: Ophthalmology;  Laterality: Right;  4.77 0:44.2   CATARACT EXTRACTION W/PHACO Left 08/05/2020   Procedure: CATARACT EXTRACTION PHACO AND INTRAOCULAR LENS PLACEMENT ([facility]) LEFT 10.73 01:13.8;  Surgeon: Eulogio Bear, MD;  Location: [place];  Service: Ophthalmology;  Laterality: Left;  Use eye stretcher not chair   HERNIA REPAIR     LUMBAR FUSION     NEPHRECTOMY Left 2005   TEMPORARY PACEMAKER N/A 05/03/2022   Procedure: TEMPORARY PACEMAKER;  Surgeon: Yolonda Kida, MD;  Location: [place] [facility];  Service: Cardiovascular;  Laterality: N/A;    MEDICATIONS:  Prior to Admission medications   Medication Sig Start Date End Date Taking? Authorizing Provider  acetaminophen (TYLENOL) 325  MG tablet Take 500 mg by mouth every 6 (six) hours as needed. 08/14/14   [provider]  aspirin 81 MG EC tablet Take 81 mg by mouth daily as needed.    [provider]  atorvastatin (LIPITOR) 40 MG tablet Take 40 mg by mouth daily.    [provider]  diclofenac Sodium (VOLTAREN) 1 % GEL Apply 2 g topically 2 (two) times daily as needed. 05/11/21   [provider]  donepezil (ARICEPT) 10 MG tablet Take 10 mg by mouth at bedtime.    [provider]  finasteride (PROSCAR) 5 MG tablet Take 5 mg by mouth daily. 04/27/22   [provider]  furosemide (LASIX) 20 MG tablet Take 20 mg by mouth 2 (two) times daily.    [provider]  furosemide (LASIX) 20 MG tablet Take 2 tablets (40 mg total) by mouth 2 (two) times daily for 3 days. 06/06/22 06/09/22  Carrie Mew, MD  gabapentin (NEURONTIN) 300 MG capsule Take 1 capsule (300 mg total) by mouth at bedtime. 06/14/22   Monica Becton, MD  hydrocortisone cream ([employer]S CORTISONE MAXIMUM STRENGTH) 1 % Apply 1 Application topically as needed for itching.    [provider]  lidocaine (LIDODERM) 5 % Place 2 patches onto the skin daily as needed. 09/04/21   [provider]  mirabegron ER (MYRBETRIQ) 25 MG TB24 tablet Take 25 mg by mouth daily.    [provider]  omeprazole ([place])  20 MG capsule Take 1 capsule (20 mg total) by mouth daily. 05/21/21   Jennye Boroughs, MD  potassium chloride SA (KLOR-CON M) 20 MEQ tablet Take 1 tablet (20 mEq total) by mouth 2 (two) times daily for 5 days. 06/06/22 06/11/22  Carrie Mew, MD  senna-docusate (SENOKOT-S) 8.6-50 MG tablet Take 1 tablet by mouth 2 (two) times daily. Patient taking differently: Take 1 tablet by mouth 2 (two) times daily as needed. 09/10/21   Dwyane Dee, MD  tamsulosin (FLOMAX) 0.4 MG CAPS capsule Take 0.4 mg by mouth daily.    [provider]  traZODone (DESYREL) 50 MG tablet Take 50-100 mg by mouth  at bedtime. Patient not taking: Reported on 06/11/2022 08/12/21   [provider]  vitamin B-12 (CYANOCOBALAMIN) 1000 MCG tablet Take 1,000 mcg by mouth daily.    [provider]    Physical Exam   Triage Vital Signs: ED Triage Vitals  Enc Vitals Group     BP 06/15/22 2313 117/72     Pulse Rate 06/15/22 2313 (!) 52     Resp 06/15/22 2313 16     Temp 06/15/22 2313 97.8 F (36.6 C)     Temp Source 06/15/22 2313 Oral     SpO2 06/15/22 2313 97 %     Weight 06/15/22 2022 236 lb (107 kg)     Height 06/15/22 2022 '6\' 1"'$  (1.854 m)     Head Circumference --      [place] --      Pain Score --      Pain Loc --      Pain Edu? --      Excl. in [place]? --     Most recent vital signs: Vitals:   06/15/22 2313 06/16/22 0100  BP: 117/72 127/75  Pulse: (!) 52 79  Resp: 16   Temp: 97.8 F (36.6 C)   SpO2: 97% 96%    CONSTITUTIONAL: Alert and oriented and responds appropriately to questions. Well-appearing; well-nourished HEAD: Normocephalic, atraumatic EYES: Conjunctivae clear, pupils appear equal, sclera nonicteric ENT: normal nose; moist mucous membranes NECK: Supple, normal ROM CARD: RRR; S1 and S2 appreciated; no murmurs, no clicks, no rubs, no gallops RESP: Normal chest excursion without splinting or tachypnea; breath sounds clear and equal bilaterally; no wheezes, no rhonchi, no rales, no hypoxia or respiratory distress, speaking full sentences ABD/GI: Tenderness in the lower abdomen without guarding or rebound, difficult to assess for distention due to body habitus BACK: The back appears normal EXT: Normal ROM in all joints; no deformity noted, no edema; no cyanosis SKIN: Normal color for age and race; warm; no rash on exposed skin NEURO: Moves all extremities equally, normal speech PSYCH: The patient's mood and manner are appropriate.   ED Results / Procedures / Treatments   LABS: (all labs ordered are listed, but only abnormal results are displayed) Labs  Reviewed  URINALYSIS, ROUTINE W REFLEX MICROSCOPIC - Abnormal; Notable for the following components:      Result Value   Color, Urine YELLOW (*)    APPearance CLOUDY (*)    Hgb urine dipstick LARGE (*)    Protein, ur 100 (*)    Leukocytes,Ua LARGE (*)    Bacteria, UA RARE (*)    All other components within normal limits  CBC WITH DIFFERENTIAL/PLATELET - Abnormal; Notable for the following components:   Hemoglobin 12.4 (*)    All other components within normal limits  BASIC METABOLIC PANEL - Abnormal; Notable for the following components:  Sodium 134 (*)    BUN 36 (*)    Creatinine, Ser 1.27 (*)    Calcium 8.7 (*)    GFR, Estimated 57 (*)    All other components within normal limits  URINE CULTURE     EKG:  RADIOLOGY: My personal review and interpretation of imaging:    I have personally reviewed all radiology reports.   No results found.   PROCEDURES:  Critical Care performed: No    Procedures    IMPRESSION / MDM / ASSESSMENT AND PLAN / ED COURSE  I reviewed the triage vital signs and the nursing notes.    Patient here with what he feels like his urinary retention from a clogged Foley catheter.    DIFFERENTIAL DIAGNOSIS (includes but not limited to):   Urinary retention, clogged catheter, dehydration, UTI, acute kidney injury   Patient's presentation is most consistent with acute presentation with potential threat to life or bodily function.   PLAN: Bladder scan is not revealing anything in his bladder however patient does have some tenderness in the lower abdomen but it is difficult to determine if he has any distention due to body habitus.  Could be secondary to UTI or urinary retention.  He has had about 100 mL out in his bag while here in the ED.  Urinalysis was sent to the lab and does show some white blood cells, red blood cells, leukocyte esterase and rare bacteria but this all could be contamination.  Will add on urine culture.  Will obtain CBC, BMP  to evaluate renal function.  Will irrigate his bladder.   MEDICATIONS GIVEN IN ED: Medications  ciprofloxacin (CIPRO) IVPB 400 mg (400 mg Intravenous New Bag/Given 06/16/22 0105)  nitrofurantoin (macrocrystal-monohydrate) (MACROBID) capsule 100 mg (100 mg Oral Given 06/16/22 0104)  morphine (PF) 2 MG/ML injection 2 mg (2 mg Intravenous Given 06/16/22 0104)  phenazopyridine (PYRIDIUM) tablet 100 mg (100 mg Oral Given 06/16/22 0141)     ED COURSE: Nurse reports with irrigation all the fluid that she is putting in is coming right back out but patient states he is feeling more distended and more uncomfortable.  Will repeat bladder scan.   Bladder scan shows 0 mL.  I wonder if he is having symptoms because what looks like could be contaminated urine sample could be a UTI.  Previous cultures have grown E. coli and Pseudomonas that are resistant to multiple antibiotics.  Will give Cipro and Macrobid for coverage.  Will give Azo here and a small dose of morphine for discomfort.  I do not think he needs to have his Foley catheter exchanged today.   1:40 AM  Pt's catheter continues to appear to be draining without difficulty and he states he is feeling better after small dose of morphine, antibiotics.  Lab work is reassuring.  No leukocytosis.  Normal hemoglobin.  Mild chronic kidney disease which is stable.  Normal electrolytes.  I feel he is safe to be discharged once IV antibiotics complete.  Will discharge with Cipro, Macrobid and close outpatient follow-up.  Urine culture is pending.   At this time, I do not feel there is any life-threatening condition present. I reviewed all nursing notes, vitals, pertinent previous records.  All lab and urine results, EKGs, imaging ordered have been independently reviewed and interpreted by myself.  I reviewed all available radiology reports from any imaging ordered this visit.  Based on my assessment, I feel the patient is safe to be discharged home without further  emergent workup and can continue workup as an outpatient as needed. Discussed all findings, treatment plan as well as usual and customary return precautions.  They verbalize understanding and are comfortable with this plan.  Outpatient follow-up has been provided as needed.  All questions have been answered.   CONSULTS:  none   OUTSIDE RECORDS REVIEWED: Reviewed patient's last family medicine note on 04/16/2022.       FINAL CLINICAL IMPRESSION(S) / ED DIAGNOSES   Final diagnoses:  Suprapubic pain  Acute UTI     Rx / DC Orders   ED Discharge Orders          Ordered    ciprofloxacin (CIPRO) 500 MG tablet  2 times daily        06/16/22 0143    nitrofurantoin, macrocrystal-monohydrate, (MACROBID) 100 MG capsule  2 times daily        06/16/22 0143    phenazopyridine (PYRIDIUM) 95 MG tablet  3 times daily PRN        06/16/22 0143             Note:  This document was prepared using Dragon voice recognition software and may include unintentional dictation errors.   Jansen Sciuto, Delice Bison, DO 06/16/22 [phone]

## 2022-06-15 NOTE — ED Notes (Signed)
Bladder scan attempted multiple times in triage with each attempt showing [facility]

## 2022-06-15 NOTE — ED Triage Notes (Signed)
EMS brings pt in from home for possible foley cath obstruction; st decreased output and bladder discomfort, LE swelling

## 2022-06-16 LAB — BASIC METABOLIC PANEL
Anion gap: 9 (ref 5–15)
BUN: 36 mg/dL — ABNORMAL HIGH (ref 8–23)
CO2: 25 mmol/L (ref 22–32)
Calcium: 8.7 mg/dL — ABNORMAL LOW (ref 8.9–10.3)
Chloride: 100 mmol/L (ref 98–111)
Creatinine, Ser: 1.27 mg/dL — ABNORMAL HIGH (ref 0.61–1.24)
GFR, Estimated: 57 mL/min — ABNORMAL LOW (ref >60)
Glucose, Bld: 99 mg/dL (ref 70–99)
Potassium: 3.5 mmol/L (ref 3.5–5.1)
Sodium: 134 mmol/L — ABNORMAL LOW (ref 135–145)

## 2022-06-16 LAB — CBC WITH DIFFERENTIAL/PLATELET
Abs Immature Granulocytes: 0.05 10*3/uL (ref 0.00–0.07)
Basophils Absolute: 0 10*3/uL (ref 0.0–0.1)
Basophils Relative: 0 %
Eosinophils Absolute: 0.1 10*3/uL (ref 0.0–0.5)
Eosinophils Relative: 1 %
HCT: 39.1 % (ref 39.0–52.0)
Hemoglobin: 12.4 g/dL — ABNORMAL LOW (ref 13.0–17.0)
Immature Granulocytes: 1 %
Lymphocytes Relative: 19 %
Lymphs Abs: 2 10*3/uL (ref 0.7–4.0)
MCH: 26.7 pg (ref 26.0–34.0)
MCHC: 31.7 g/dL (ref 30.0–36.0)
MCV: 84.3 fL (ref 80.0–100.0)
Monocytes Absolute: 0.9 10*3/uL (ref 0.1–1.0)
Monocytes Relative: 8 %
Neutro Abs: 7.5 10*3/uL (ref 1.7–7.7)
Neutrophils Relative %: 71 %
Platelets: 217 10*3/uL (ref 150–400)
RBC: 4.64 MIL/uL (ref 4.22–5.81)
RDW: 14.7 % (ref 11.5–15.5)
WBC: 10.5 10*3/uL (ref 4.0–10.5)
nRBC: 0 % (ref 0.0–0.2)

## 2022-06-16 MED ORDER — MORPHINE SULFATE (PF) 2 MG/ML IV SOLN
2.0000 mg | Freq: Once | INTRAVENOUS | Status: AC
  Administered 2022-06-16: 2 mg via INTRAVENOUS
  Filled 2022-06-16: qty 1

## 2022-06-16 MED ORDER — NITROFURANTOIN MONOHYD MACRO 100 MG PO CAPS
100.0000 mg | ORAL_CAPSULE | Freq: Once | ORAL | Status: AC
  Administered 2022-06-16: 100 mg via ORAL
  Filled 2022-06-16: qty 1

## 2022-06-16 MED ORDER — CIPROFLOXACIN IN D5W 400 MG/200ML IV SOLN
400.0000 mg | Freq: Once | INTRAVENOUS | Status: AC
  Administered 2022-06-16: 400 mg via INTRAVENOUS
  Filled 2022-06-16: qty 200

## 2022-06-16 MED ORDER — PHENAZOPYRIDINE HCL 100 MG PO TABS
95.0000 mg | ORAL_TABLET | Freq: Once | ORAL | Status: AC
  Administered 2022-06-16: 100 mg via ORAL
  Filled 2022-06-16 (×2): qty 1

## 2022-06-16 MED ORDER — NITROFURANTOIN MONOHYD MACRO 100 MG PO CAPS: 100.0000 mg | ORAL_CAPSULE | Freq: Two times a day (BID) | ORAL | 0 refills | Status: AC

## 2022-06-16 MED ORDER — CIPROFLOXACIN HCL 500 MG PO TABS: 500.0000 mg | ORAL_TABLET | Freq: Two times a day (BID) | ORAL | 0 refills | Status: AC

## 2022-06-16 MED ORDER — PHENAZOPYRIDINE HCL 95 MG PO TABS: 95.0000 mg | ORAL_TABLET | Freq: Three times a day (TID) | ORAL | 0 refills | Status: DC | PRN

## 2022-06-16 NOTE — ED Notes (Signed)
[facility] [place]

## 2022-06-16 NOTE — ED Notes (Signed)
Cancelled EMS transport as patient's daughter to come pick patient up

## 2022-06-16 NOTE — ED Notes (Signed)
Previous nurse gave d/c paperwork to patient.

## 2022-06-16 NOTE — Discharge Instructions (Addendum)
I recommend close follow-up with your outpatient urologist.

## 2022-06-16 NOTE — ED Notes (Signed)
Call made to department due to pt continuously calling [facility] for ambulance transport back home. Writer called to speak with pt on misuse of emergency services. Pt reminded by Probation officer that he is to rest until his family can come pick him up after 7am. Pt continuing to state he did not call 911 and that he forgot what he was waiting for. Pt redirected as well as re informed on plan of care.

## 2022-06-17 LAB — URINE CULTURE: Culture: NO GROWTH

## 2022-06-25 ENCOUNTER — Encounter: Payer: Self-pay | Admitting: Family

## 2022-07-10 ENCOUNTER — Emergency Department
Admission: EM | Admit: 2022-07-10 | Discharge: 2022-07-10 | Disposition: A | Discharge status: Home / Self Care | Payer: Medicare Other | Attending: Student in an Organized Health Care Education/Training Program | Admitting: Student in an Organized Health Care Education/Training Program

## 2022-07-10 ENCOUNTER — Emergency Department: Payer: Medicare Other

## 2022-07-10 DIAGNOSIS — N390 Urinary tract infection, site not specified: Secondary | ICD-10-CM | POA: Diagnosis not present

## 2022-07-10 DIAGNOSIS — R55 Syncope and collapse: Secondary | ICD-10-CM | POA: Diagnosis not present

## 2022-07-10 DIAGNOSIS — R32 Unspecified urinary incontinence: Secondary | ICD-10-CM | POA: Diagnosis not present

## 2022-07-10 DIAGNOSIS — R339 Retention of urine, unspecified: Secondary | ICD-10-CM | POA: Diagnosis present

## 2022-07-10 LAB — URINALYSIS, ROUTINE W REFLEX MICROSCOPIC
Bilirubin Urine: NEGATIVE
Glucose, UA: NEGATIVE mg/dL
Ketones, ur: NEGATIVE mg/dL
Nitrite: NEGATIVE
Protein, ur: 30 mg/dL — AB
Specific Gravity, Urine: 1.014 (ref 1.005–1.030)
WBC, UA: >50 WBC/hpf (ref 0–5)
pH: 6 (ref 5.0–8.0)

## 2022-07-10 LAB — CBC WITH DIFFERENTIAL/PLATELET
Abs Immature Granulocytes: 0.15 10*3/uL — ABNORMAL HIGH (ref 0.00–0.07)
Basophils Absolute: 0 10*3/uL (ref 0.0–0.1)
Basophils Relative: 0 %
Eosinophils Absolute: 0 10*3/uL (ref 0.0–0.5)
Eosinophils Relative: 0 %
HCT: 37.8 % — ABNORMAL LOW (ref 39.0–52.0)
Hemoglobin: 11.9 g/dL — ABNORMAL LOW (ref 13.0–17.0)
Immature Granulocytes: 1 %
Lymphocytes Relative: 13 %
Lymphs Abs: 1.7 10*3/uL (ref 0.7–4.0)
MCH: 26.1 pg (ref 26.0–34.0)
MCHC: 31.5 g/dL (ref 30.0–36.0)
MCV: 82.9 fL (ref 80.0–100.0)
Monocytes Absolute: 0.8 10*3/uL (ref 0.1–1.0)
Monocytes Relative: 7 %
Neutro Abs: 9.9 10*3/uL — ABNORMAL HIGH (ref 1.7–7.7)
Neutrophils Relative %: 79 %
Platelets: 323 10*3/uL (ref 150–400)
RBC: 4.56 MIL/uL (ref 4.22–5.81)
RDW: 15.4 % (ref 11.5–15.5)
WBC: 12.6 10*3/uL — ABNORMAL HIGH (ref 4.0–10.5)
nRBC: 0 % (ref 0.0–0.2)

## 2022-07-10 LAB — COMPREHENSIVE METABOLIC PANEL WITH GFR
ALT: 32 U/L (ref 0–44)
AST: 29 U/L (ref 15–41)
Albumin: 3.9 g/dL (ref 3.5–5.0)
Alkaline Phosphatase: 76 U/L (ref 38–126)
Anion gap: 14 (ref 5–15)
BUN: 32 mg/dL — ABNORMAL HIGH (ref 8–23)
CO2: 24 mmol/L (ref 22–32)
Calcium: 9.1 mg/dL (ref 8.9–10.3)
Chloride: 93 mmol/L — ABNORMAL LOW (ref 98–111)
Creatinine, Ser: 1.38 mg/dL — ABNORMAL HIGH (ref 0.61–1.24)
GFR, Estimated: 51 mL/min — ABNORMAL LOW
Glucose, Bld: 126 mg/dL — ABNORMAL HIGH (ref 70–99)
Potassium: 3.9 mmol/L (ref 3.5–5.1)
Sodium: 131 mmol/L — ABNORMAL LOW (ref 135–145)
Total Bilirubin: 1 mg/dL (ref 0.3–1.2)
Total Protein: 8.6 g/dL — ABNORMAL HIGH (ref 6.5–8.1)

## 2022-07-10 LAB — TROPONIN I (HIGH SENSITIVITY)
Troponin I (High Sensitivity): 55 ng/L — ABNORMAL HIGH
Troponin I (High Sensitivity): 57 ng/L — ABNORMAL HIGH

## 2022-07-10 MED ORDER — CEPHALEXIN 500 MG PO CAPS: 500.0000 mg | ORAL_CAPSULE | Freq: Two times a day (BID) | ORAL | 0 refills | Status: AC

## 2022-07-10 MED ORDER — SODIUM CHLORIDE 0.9 % IV SOLN
1.0000 g | Freq: Once | INTRAVENOUS | Status: AC
  Administered 2022-07-10: 1 g via INTRAVENOUS
  Filled 2022-07-10: qty 10

## 2022-07-10 NOTE — ED Provider Notes (Addendum)
[facility] Provider Note    Event Date/Time   First MD Initiated Contact with Patient 07/10/22 1017     (approximate)   History   Loss of Consciousness, Weakness, and Urinary Retention   HPI  Adam Rios. is a 82 y.o. male with an extensive past medical history with recent prostate urological procedure at [facility] presents to the ER for evaluation of urinary incontinence since Foley catheter was removed reportedly 3 days ago.  States that he has been feeling like he is about to pass out denies any chest pain.  No headache.  Denies any abdominal pain.     Physical Exam   Triage Vital Signs: ED Triage Vitals  Enc Vitals Group     BP 07/10/22 1012 113/76     Pulse Rate 07/10/22 1012 99     Resp 07/10/22 1012 (!) 24     Temp 07/10/22 1012 98.2 F (36.8 C)     Temp src --      SpO2 07/10/22 1012 97 %     Weight 07/10/22 1014 233 lb (105.7 kg)     Height 07/10/22 1014 6' 1"$  (1.854 m)     Head Circumference --      Peak Flow --      Pain Score 07/10/22 1013 9     Pain Loc --      Pain Edu? --      Excl. in [place]? --     Most recent vital signs: Vitals:   07/10/22 1012 07/10/22 1026  BP: 113/76   Pulse: 99   Resp: (!) 24   Temp: 98.2 F (36.8 C)   SpO2: 97% 98%     Constitutional: Alert  Eyes: Conjunctivae are normal.  Head: Atraumatic. Nose: No congestion/rhinnorhea. Mouth/Throat: Mucous membranes are moist.   Neck: Painless ROM.  Cardiovascular:   Good peripheral circulation. Respiratory: Normal respiratory effort.  No retractions.  Gastrointestinal: Soft and nontender.  No suprpapubic fullness or ttp Musculoskeletal:  no deformity Neurologic:  MAE spontaneously. No gross focal neurologic deficits are appreciated.  Skin:  Skin is warm, dry and intact. No rash noted. Psychiatric: anxious appearing    ED Results / Procedures / Treatments   Labs (all labs ordered are listed, but only abnormal results are displayed) Labs Reviewed   CBC WITH DIFFERENTIAL/PLATELET - Abnormal; Notable for the following components:      Result Value   WBC 12.6 (*)    Hemoglobin 11.9 (*)    HCT 37.8 (*)    Neutro Abs 9.9 (*)    Abs Immature Granulocytes 0.15 (*)    All other components within normal limits  COMPREHENSIVE METABOLIC PANEL - Abnormal; Notable for the following components:   Sodium 131 (*)    Chloride 93 (*)    Glucose, Bld 126 (*)    BUN 32 (*)    Creatinine, Ser 1.38 (*)    Total Protein 8.6 (*)    GFR, Estimated 51 (*)    All other components within normal limits  URINALYSIS, ROUTINE W REFLEX MICROSCOPIC - Abnormal; Notable for the following components:   Color, Urine YELLOW (*)    APPearance CLOUDY (*)    Hgb urine dipstick LARGE (*)    Protein, ur 30 (*)    Leukocytes,Ua LARGE (*)    Bacteria, UA RARE (*)    All other components within normal limits  TROPONIN I (HIGH SENSITIVITY) - Abnormal; Notable for the following components:   Troponin  I (High Sensitivity) 55 (*)    All other components within normal limits  TROPONIN I (HIGH SENSITIVITY) - Abnormal; Notable for the following components:   Troponin I (High Sensitivity) 57 (*)    All other components within normal limits     EKG  ED ECG REPORT I, Merlyn Lot, the attending physician, personally viewed and interpreted this ECG.   Date: 07/10/2022  EKG Time: 11:49  Rate: 85  Rhythm: sinus  Axis: left  Intervals: normal  ST&T Change: no stemi, no depressions    RADIOLOGY Please see ED Course for my review and interpretation.  I personally reviewed all radiographic images ordered to evaluate for the above acute complaints and reviewed radiology reports and findings.  These findings were personally discussed with the patient.  Please see medical record for radiology report.    PROCEDURES:  Critical Care performed: No  Procedures   MEDICATIONS ORDERED IN ED: Medications  cefTRIAXone (ROCEPHIN) 1 g in sodium chloride 0.9 % 100 mL  IVPB (0 g Intravenous Stopped 07/10/22 1316)     IMPRESSION / MDM / ASSESSMENT AND PLAN / ED COURSE  I reviewed the triage vital signs and the nursing notes.                              Differential diagnosis includes, but is not limited to, neurogenic bladder, urinary incontinence, electrolyte abnormality, sepsis, UTI, prostatitis, SAH, IPH,  Patient presenting to the ER for evaluation of symptoms as described above.  Based on symptoms, risk factors and considered above differential, this presenting complaint could reflect a potentially life-threatening illness therefore the patient will be placed on continuous pulse oximetry and telemetry for monitoring.  Laboratory evaluation will be sent to evaluate for the above complaints.  Patient did not fully lose consciousness denying any chest pain or shortness of breath.  Given his age and risk factors will order blood work check urinalysis order CT imaging as well as chest x-ray.  Patient seems to be quite focused on needing Foley catheter and I suspect this may be a large component of why he came to the ER.  Will rule out infectious etiology.  He is not febrile not septic denies any shortness of breath is not hypoxic.   Clinical Course as of 07/10/22 1327  Fri Jul 10, 2022  1126 Urinalysis does appear consistent with UTI cloudy positive leuks many whites rare bacteria.  Will order dose of IV Rocephin. [PR]  [id number] Chest x-ray my review and interpretation without evidence of consolidation or pneumothorax. [PR]  [id number] Case was discussed with urology clinic who had actually scheduled the patient for nurse clinic to insert Foley catheter due to his urinary incontinence.  They did advise and agreed to Foley insertion here in the ER and they will follow him in clinic. [PR]  1326 Patient's repeat troponin stable.  Denies any pain or pressure.  Is quite happy and Foley catheter is inserted.  I do not feel that further diagnostic testing clinically indicated.   Patient placed on antibiotics.  Does appear stable and appropriate for outpatient follow-up. [PR]    Clinical Course User Index [PR] Merlyn Lot, MD     FINAL CLINICAL IMPRESSION(S) / ED DIAGNOSES   Final diagnoses:  Lower urinary tract infectious disease  Urinary incontinence, unspecified type     Rx / DC Orders   ED Discharge Orders          Ordered  cephALEXin (KEFLEX) 500 MG capsule  2 times daily        07/10/22 1318             Note:  This document was prepared using Dragon voice recognition software and may include unintentional dictation errors.       Merlyn Lot, MD 07/10/22 [phone]

## 2022-07-10 NOTE — Discharge Instructions (Signed)
Follow-up in urology clinic.  A prescription for antibiotics have been sent to your pharmacy.  You should start taking this tomorrow morning as you have already received your dose of antibiotics for today.  Return to the ER if you have any additional questions or concerns.

## 2022-07-10 NOTE — ED Triage Notes (Signed)
Patient presents to the ER via EMS from home with c/o weakness, syncope for a 2-3 days. Patient reports that he is having urinary retention since he had a urinary cath removed 3 days ago. Patient had come some urinary incontinence when arrived to the ER.

## 2022-07-10 NOTE — ED Notes (Signed)
[facility] Urology for consult   Dr. Dayle Points will return call [phone]

## 2022-07-10 NOTE — ED Notes (Signed)
Called to follow up for phone consult  informed MD will call shortly  [phone]

## 2022-08-09 ENCOUNTER — Emergency Department: Payer: Medicare Other

## 2022-08-09 ENCOUNTER — Inpatient Hospital Stay: Payer: Medicare Other

## 2022-08-09 ENCOUNTER — Other Ambulatory Visit: Payer: Self-pay

## 2022-08-09 ENCOUNTER — Encounter: Payer: Self-pay | Admitting: Internal Medicine

## 2022-08-09 ENCOUNTER — Inpatient Hospital Stay
Admission: EM | Admit: 2022-08-09 | Discharge: 2022-08-15 | DRG: 698 | Disposition: A | Discharge status: Home Health | Payer: Medicare Other | Attending: Internal Medicine | Admitting: Internal Medicine

## 2022-08-09 DIAGNOSIS — R6520 Severe sepsis without septic shock: Secondary | ICD-10-CM | POA: Diagnosis present

## 2022-08-09 DIAGNOSIS — E876 Hypokalemia: Secondary | ICD-10-CM | POA: Diagnosis not present

## 2022-08-09 DIAGNOSIS — N319 Neuromuscular dysfunction of bladder, unspecified: Secondary | ICD-10-CM | POA: Diagnosis not present

## 2022-08-09 DIAGNOSIS — K219 Gastro-esophageal reflux disease without esophagitis: Secondary | ICD-10-CM | POA: Diagnosis present

## 2022-08-09 DIAGNOSIS — K592 Neurogenic bowel, not elsewhere classified: Secondary | ICD-10-CM | POA: Diagnosis present

## 2022-08-09 DIAGNOSIS — Z87891 Personal history of nicotine dependence: Secondary | ICD-10-CM | POA: Diagnosis not present

## 2022-08-09 DIAGNOSIS — I509 Heart failure, unspecified: Secondary | ICD-10-CM

## 2022-08-09 DIAGNOSIS — A4151 Sepsis due to Escherichia coli [E. coli]: Secondary | ICD-10-CM | POA: Diagnosis present

## 2022-08-09 DIAGNOSIS — Z905 Acquired absence of kidney: Secondary | ICD-10-CM

## 2022-08-09 DIAGNOSIS — E785 Hyperlipidemia, unspecified: Secondary | ICD-10-CM | POA: Diagnosis present

## 2022-08-09 DIAGNOSIS — Z85528 Personal history of other malignant neoplasm of kidney: Secondary | ICD-10-CM | POA: Diagnosis not present

## 2022-08-09 DIAGNOSIS — T83511A Infection and inflammatory reaction due to indwelling urethral catheter, initial encounter: Principal | ICD-10-CM | POA: Diagnosis present

## 2022-08-09 DIAGNOSIS — F0390 Unspecified dementia without behavioral disturbance: Secondary | ICD-10-CM | POA: Diagnosis present

## 2022-08-09 DIAGNOSIS — R531 Weakness: Secondary | ICD-10-CM

## 2022-08-09 DIAGNOSIS — Z978 Presence of other specified devices: Secondary | ICD-10-CM

## 2022-08-09 DIAGNOSIS — K5900 Constipation, unspecified: Secondary | ICD-10-CM | POA: Diagnosis present

## 2022-08-09 DIAGNOSIS — N390 Urinary tract infection, site not specified: Secondary | ICD-10-CM | POA: Diagnosis present

## 2022-08-09 DIAGNOSIS — M545 Low back pain, unspecified: Secondary | ICD-10-CM | POA: Diagnosis present

## 2022-08-09 DIAGNOSIS — A419 Sepsis, unspecified organism: Secondary | ICD-10-CM | POA: Diagnosis present

## 2022-08-09 DIAGNOSIS — Z7982 Long term (current) use of aspirin: Secondary | ICD-10-CM

## 2022-08-09 DIAGNOSIS — Z1623 Resistance to quinolones and fluoroquinolones: Secondary | ICD-10-CM | POA: Diagnosis present

## 2022-08-09 DIAGNOSIS — N138 Other obstructive and reflux uropathy: Secondary | ICD-10-CM | POA: Diagnosis present

## 2022-08-09 DIAGNOSIS — I5032 Chronic diastolic (congestive) heart failure: Secondary | ICD-10-CM | POA: Diagnosis not present

## 2022-08-09 DIAGNOSIS — R6510 Systemic inflammatory response syndrome (SIRS) of non-infectious origin without acute organ dysfunction: Secondary | ICD-10-CM | POA: Diagnosis not present

## 2022-08-09 DIAGNOSIS — E871 Hypo-osmolality and hyponatremia: Secondary | ICD-10-CM | POA: Diagnosis present

## 2022-08-09 DIAGNOSIS — N1831 Chronic kidney disease, stage 3a: Secondary | ICD-10-CM | POA: Diagnosis present

## 2022-08-09 DIAGNOSIS — N401 Enlarged prostate with lower urinary tract symptoms: Secondary | ICD-10-CM | POA: Diagnosis present

## 2022-08-09 DIAGNOSIS — I5030 Unspecified diastolic (congestive) heart failure: Secondary | ICD-10-CM | POA: Insufficient documentation

## 2022-08-09 DIAGNOSIS — D72829 Elevated white blood cell count, unspecified: Secondary | ICD-10-CM | POA: Diagnosis not present

## 2022-08-09 DIAGNOSIS — I130 Hypertensive heart and chronic kidney disease with heart failure and stage 1 through stage 4 chronic kidney disease, or unspecified chronic kidney disease: Secondary | ICD-10-CM | POA: Diagnosis present

## 2022-08-09 DIAGNOSIS — Z79899 Other long term (current) drug therapy: Secondary | ICD-10-CM | POA: Diagnosis not present

## 2022-08-09 DIAGNOSIS — C649 Malignant neoplasm of unspecified kidney, except renal pelvis: Secondary | ICD-10-CM | POA: Diagnosis present

## 2022-08-09 DIAGNOSIS — Z981 Arthrodesis status: Secondary | ICD-10-CM

## 2022-08-09 DIAGNOSIS — N179 Acute kidney failure, unspecified: Secondary | ICD-10-CM | POA: Diagnosis present

## 2022-08-09 DIAGNOSIS — R338 Other retention of urine: Secondary | ICD-10-CM | POA: Diagnosis present

## 2022-08-09 DIAGNOSIS — I442 Atrioventricular block, complete: Secondary | ICD-10-CM | POA: Diagnosis present

## 2022-08-09 DIAGNOSIS — Z8673 Personal history of transient ischemic attack (TIA), and cerebral infarction without residual deficits: Secondary | ICD-10-CM

## 2022-08-09 DIAGNOSIS — Z950 Presence of cardiac pacemaker: Secondary | ICD-10-CM

## 2022-08-09 DIAGNOSIS — N189 Chronic kidney disease, unspecified: Secondary | ICD-10-CM | POA: Diagnosis present

## 2022-08-09 DIAGNOSIS — Z885 Allergy status to narcotic agent status: Secondary | ICD-10-CM

## 2022-08-09 DIAGNOSIS — Z888 Allergy status to other drugs, medicaments and biological substances status: Secondary | ICD-10-CM

## 2022-08-09 DIAGNOSIS — I69319 Unspecified symptoms and signs involving cognitive functions following cerebral infarction: Secondary | ICD-10-CM | POA: Diagnosis not present

## 2022-08-09 DIAGNOSIS — G4700 Insomnia, unspecified: Secondary | ICD-10-CM | POA: Diagnosis present

## 2022-08-09 DIAGNOSIS — I10 Essential (primary) hypertension: Secondary | ICD-10-CM | POA: Diagnosis present

## 2022-08-09 DIAGNOSIS — Z8679 Personal history of other diseases of the circulatory system: Secondary | ICD-10-CM

## 2022-08-09 DIAGNOSIS — K5909 Other constipation: Secondary | ICD-10-CM | POA: Diagnosis present

## 2022-08-09 DIAGNOSIS — Z87440 Personal history of urinary (tract) infections: Secondary | ICD-10-CM

## 2022-08-09 LAB — CBC WITH DIFFERENTIAL/PLATELET
Abs Immature Granulocytes: 0.1 10*3/uL — ABNORMAL HIGH (ref 0.00–0.07)
Basophils Absolute: 0 10*3/uL (ref 0.0–0.1)
Basophils Relative: 0 %
Eosinophils Absolute: 0 10*3/uL (ref 0.0–0.5)
Eosinophils Relative: 0 %
HCT: 40.6 % (ref 39.0–52.0)
Hemoglobin: 13 g/dL (ref 13.0–17.0)
Immature Granulocytes: 1 %
Lymphocytes Relative: 11 %
Lymphs Abs: 1.6 10*3/uL (ref 0.7–4.0)
MCH: 26.1 pg (ref 26.0–34.0)
MCHC: 32 g/dL (ref 30.0–36.0)
MCV: 81.5 fL (ref 80.0–100.0)
Monocytes Absolute: 1 10*3/uL (ref 0.1–1.0)
Monocytes Relative: 7 %
Neutro Abs: 11.7 10*3/uL — ABNORMAL HIGH (ref 1.7–7.7)
Neutrophils Relative %: 81 %
Platelets: 253 10*3/uL (ref 150–400)
RBC: 4.98 MIL/uL (ref 4.22–5.81)
RDW: 16.1 % — ABNORMAL HIGH (ref 11.5–15.5)
WBC: 14.4 10*3/uL — ABNORMAL HIGH (ref 4.0–10.5)
nRBC: 0 % (ref 0.0–0.2)

## 2022-08-09 LAB — COMPREHENSIVE METABOLIC PANEL
ALT: 23 U/L (ref 0–44)
AST: 30 U/L (ref 15–41)
Albumin: 4.3 g/dL (ref 3.5–5.0)
Alkaline Phosphatase: 79 U/L (ref 38–126)
Anion gap: 16 — ABNORMAL HIGH (ref 5–15)
BUN: 75 mg/dL — ABNORMAL HIGH (ref 8–23)
CO2: 24 mmol/L (ref 22–32)
Calcium: 9.1 mg/dL (ref 8.9–10.3)
Chloride: 87 mmol/L — ABNORMAL LOW (ref 98–111)
Creatinine, Ser: 2.25 mg/dL — ABNORMAL HIGH (ref 0.61–1.24)
GFR, Estimated: 29 mL/min — ABNORMAL LOW (ref >60)
Glucose, Bld: 130 mg/dL — ABNORMAL HIGH (ref 70–99)
Potassium: 3.9 mmol/L (ref 3.5–5.1)
Sodium: 127 mmol/L — ABNORMAL LOW (ref 135–145)
Total Bilirubin: 1.2 mg/dL (ref 0.3–1.2)
Total Protein: 9 g/dL — ABNORMAL HIGH (ref 6.5–8.1)

## 2022-08-09 LAB — LACTIC ACID, PLASMA
Lactic Acid, Venous: 1.7 mmol/L (ref 0.5–1.9)
Lactic Acid, Venous: 2.7 mmol/L (ref 0.5–1.9)

## 2022-08-09 LAB — URINALYSIS, W/ REFLEX TO CULTURE (INFECTION SUSPECTED)
Bilirubin Urine: NEGATIVE
Glucose, UA: NEGATIVE mg/dL
Ketones, ur: NEGATIVE mg/dL
Nitrite: NEGATIVE
Protein, ur: NEGATIVE mg/dL
Specific Gravity, Urine: 1.005 (ref 1.005–1.030)
WBC, UA: >50 WBC/hpf (ref 0–5)
pH: 6 (ref 5.0–8.0)

## 2022-08-09 LAB — TROPONIN I (HIGH SENSITIVITY)
Troponin I (High Sensitivity): 54 ng/L — ABNORMAL HIGH (ref <18)
Troponin I (High Sensitivity): 51 ng/L — ABNORMAL HIGH (ref <18)

## 2022-08-09 LAB — VITAMIN B12: Vitamin B-12: 1151 pg/mL — ABNORMAL HIGH (ref 180–914)

## 2022-08-09 LAB — LIPASE, BLOOD: Lipase: 49 U/L (ref 11–51)

## 2022-08-09 MED ORDER — HYDRALAZINE HCL 20 MG/ML IJ SOLN: 5.0000 mg | Freq: Three times a day (TID) | INTRAMUSCULAR | Status: AC | PRN

## 2022-08-09 MED ORDER — CHLORHEXIDINE GLUCONATE CLOTH 2 % EX PADS
6.0000 | MEDICATED_PAD | Freq: Every day | CUTANEOUS | Status: DC
  Administered 2022-08-10 – 2022-08-15 (×7): 6 via TOPICAL

## 2022-08-09 MED ORDER — ACETAMINOPHEN 325 MG PO TABS
650.0000 mg | ORAL_TABLET | Freq: Four times a day (QID) | ORAL | Status: AC | PRN
  Administered 2022-08-10 – 2022-08-11 (×2): 650 mg via ORAL
  Filled 2022-08-09 (×2): qty 2

## 2022-08-09 MED ORDER — ATORVASTATIN CALCIUM 20 MG PO TABS
40.0000 mg | ORAL_TABLET | Freq: Every day | ORAL | Status: DC
  Administered 2022-08-09 – 2022-08-15 (×7): 40 mg via ORAL
  Filled 2022-08-09 (×7): qty 2

## 2022-08-09 MED ORDER — GABAPENTIN 300 MG PO CAPS
300.0000 mg | ORAL_CAPSULE | Freq: Every day | ORAL | Status: DC
  Administered 2022-08-09 – 2022-08-14 (×6): 300 mg via ORAL
  Filled 2022-08-09 (×6): qty 1

## 2022-08-09 MED ORDER — DONEPEZIL HCL 5 MG PO TABS
10.0000 mg | ORAL_TABLET | Freq: Every day | ORAL | Status: DC
  Administered 2022-08-09 – 2022-08-14 (×6): 10 mg via ORAL
  Filled 2022-08-09 (×6): qty 2

## 2022-08-09 MED ORDER — ONDANSETRON HCL 4 MG PO TABS: 4.0000 mg | ORAL_TABLET | Freq: Four times a day (QID) | ORAL | Status: AC | PRN

## 2022-08-09 MED ORDER — SODIUM CHLORIDE 0.9 % IV BOLUS: 1000.0000 mL | Freq: Once | INTRAVENOUS | Status: DC

## 2022-08-09 MED ORDER — SODIUM CHLORIDE 0.9 % IV SOLN
2.0000 g | Freq: Two times a day (BID) | INTRAVENOUS | Status: DC
  Administered 2022-08-09 – 2022-08-11 (×4): 2 g via INTRAVENOUS
  Filled 2022-08-09 (×4): qty 12.5

## 2022-08-09 MED ORDER — SODIUM CHLORIDE 0.9 % IV BOLUS
1000.0000 mL | Freq: Once | INTRAVENOUS | Status: AC
  Administered 2022-08-09: 1000 mL via INTRAVENOUS

## 2022-08-09 MED ORDER — SODIUM CHLORIDE 0.9 % IV SOLN
1.0000 g | Freq: Once | INTRAVENOUS | Status: AC
  Administered 2022-08-09: 1 g via INTRAVENOUS
  Filled 2022-08-09: qty 10

## 2022-08-09 MED ORDER — SENNOSIDES-DOCUSATE SODIUM 8.6-50 MG PO TABS
1.0000 | ORAL_TABLET | Freq: Two times a day (BID) | ORAL | Status: DC | PRN
  Filled 2022-08-09: qty 1

## 2022-08-09 MED ORDER — LACTATED RINGERS IV BOLUS
1000.0000 mL | Freq: Once | INTRAVENOUS | Status: AC
  Administered 2022-08-09: 1000 mL via INTRAVENOUS

## 2022-08-09 MED ORDER — DICLOFENAC SODIUM 1 % EX GEL: 2.0000 g | Freq: Two times a day (BID) | CUTANEOUS | Status: DC | PRN

## 2022-08-09 MED ORDER — POLYETHYLENE GLYCOL 3350 17 G PO PACK: 17.0000 g | PACK | Freq: Every day | ORAL | Status: DC | PRN

## 2022-08-09 MED ORDER — ONDANSETRON HCL 4 MG/2ML IJ SOLN
4.0000 mg | Freq: Four times a day (QID) | INTRAMUSCULAR | Status: AC | PRN
  Administered 2022-08-13: 4 mg via INTRAVENOUS
  Filled 2022-08-09: qty 2

## 2022-08-09 MED ORDER — FINASTERIDE 5 MG PO TABS
5.0000 mg | ORAL_TABLET | Freq: Every day | ORAL | Status: DC
  Administered 2022-08-10 – 2022-08-15 (×6): 5 mg via ORAL
  Filled 2022-08-09 (×6): qty 1

## 2022-08-09 MED ORDER — ASPIRIN 81 MG PO TBEC
81.0000 mg | DELAYED_RELEASE_TABLET | Freq: Every day | ORAL | Status: DC
  Administered 2022-08-10 – 2022-08-15 (×6): 81 mg via ORAL
  Filled 2022-08-09 (×6): qty 1

## 2022-08-09 MED ORDER — MIRABEGRON ER 25 MG PO TB24
25.0000 mg | ORAL_TABLET | Freq: Every day | ORAL | Status: DC
  Administered 2022-08-09 – 2022-08-15 (×7): 25 mg via ORAL
  Filled 2022-08-09 (×7): qty 1

## 2022-08-09 MED ORDER — METOPROLOL SUCCINATE ER 25 MG PO TB24
25.0000 mg | ORAL_TABLET | Freq: Every day | ORAL | Status: DC
  Administered 2022-08-10 – 2022-08-15 (×5): 25 mg via ORAL
  Filled 2022-08-09 (×6): qty 1

## 2022-08-09 MED ORDER — ACETAMINOPHEN 650 MG RE SUPP: 650.0000 mg | Freq: Four times a day (QID) | RECTAL | Status: AC | PRN

## 2022-08-09 MED ORDER — SODIUM CHLORIDE 0.9 % IV SOLN: INTRAVENOUS | Status: AC

## 2022-08-09 MED ORDER — SENNOSIDES-DOCUSATE SODIUM 8.6-50 MG PO TABS: 1.0000 | ORAL_TABLET | Freq: Every evening | ORAL | Status: DC | PRN

## 2022-08-09 MED ORDER — VITAMIN B-12 1000 MCG PO TABS
1000.0000 ug | ORAL_TABLET | Freq: Every day | ORAL | Status: DC
  Administered 2022-08-10 – 2022-08-15 (×6): 1000 ug via ORAL
  Filled 2022-08-09 (×6): qty 1

## 2022-08-09 MED ORDER — LACTATED RINGERS IV BOLUS
500.0000 mL | Freq: Once | INTRAVENOUS | Status: AC
  Administered 2022-08-09: 500 mL via INTRAVENOUS

## 2022-08-09 MED ORDER — HEPARIN SODIUM (PORCINE) 5000 UNIT/ML IJ SOLN
5000.0000 [IU] | Freq: Three times a day (TID) | INTRAMUSCULAR | Status: DC
  Administered 2022-08-09 – 2022-08-15 (×17): 5000 [IU] via SUBCUTANEOUS
  Filled 2022-08-09 (×17): qty 1

## 2022-08-09 MED ORDER — CYCLOBENZAPRINE HCL 10 MG PO TABS
5.0000 mg | ORAL_TABLET | Freq: Three times a day (TID) | ORAL | Status: DC | PRN
  Administered 2022-08-09: 10 mg via ORAL
  Filled 2022-08-09: qty 1

## 2022-08-09 NOTE — Assessment & Plan Note (Signed)
Home senna docusate twice daily as needed for mild constipation MiraLAX daily as needed for moderate constipation, 4 days ordered

## 2022-08-09 NOTE — Assessment & Plan Note (Signed)
Status post left nephrectomy in 1984

## 2022-08-09 NOTE — Progress Notes (Signed)
Informed Hospitalist critical lactic 2.7

## 2022-08-09 NOTE — Assessment & Plan Note (Signed)
Present on admission, complicated by chronic indwelling Foley catheter Status post ceftriaxone per EDP Patient has history of Pseudomonas UTI, cefepime per pharmacy has been ordered Urine culture in process

## 2022-08-09 NOTE — Assessment & Plan Note (Signed)
-   Atorvastatin 40 mg daily resumed 

## 2022-08-09 NOTE — H&P (Addendum)
History and Physical   Adam Rios. EI:[id number] DOB: 05-30-1941 DOA: 08/09/2022  PCP: Dola Argyle, MD  Outpatient Specialists: [facility] cardiology Patient coming from: Home via EMS  I have personally briefly reviewed patient's old medical records in [place].  Chief Concern: Increasing weakness for 2 weeks  HPI: Adam Rios is a 82 year old male with history of chronic indwelling Foley, dementia, hyperlipidemia, insomnia, chronic heart failure with preserved ejection fraction, history of right lower extremity cellulitis, who presents emergency department for chief concerns of generalized weakness for 2 weeks.  Patient reports he was diagnosed with UTI and prescribed Macrobid last Wednesday from his PCP.  He reports minimal improvement since then.  Vitals in the ED showed temperature of 97.4, respiration rate 20, heart rate 85, blood pressure 121/91, SpO2 of 93% on room air.  Serum sodium is 127, potassium 3.9, chloride 87, bicarb 24, BUN of 75, serum creatinine of 2.25, EGFR 29, nonfasting blood glucose 130, WBC 14.4, hemoglobin 13, platelets of 253.  High sensitive troponin was 54 and on repeat was 51.  UA was positive for large leukocytes.  ED treatment: Ceftriaxone 1 g IV one-time dose, sodium chloride 1 L bolus. ---------------------- At bedside, he was able to tell me his name, age is 82, and the current year is 2023, and he is in [place].  He reports weakness started yesterday. He denies chest pain, shortness of breath, nausea, vomiting, diarrhea.  He denies loss of consciousness or syncope.  He denies swelling of his lower extremities.  He has a chronic indwelling Foley catheter and he states his last Foley was changed recently.  He reports poor p.o. intake of the last 2 to 3 days.  He reports he has no appetite.  He endorses feeling generalized weakness.  He reports he does feel better after receiving fluids and antibiotic compared to when he was at  home earlier.  Social history: He lives with his daughter, son in law, and two grand children. He denies tobacco, etoh, recreational drugs. He is retired and formerly was a traveling Press photographer men, Secondary school teacher  ROS: Constitutional: no weight change, no fever ENT/Mouth: no sore throat, no rhinorrhea Eyes: no eye pain, no vision changes Cardiovascular: no chest pain, no dyspnea,  no edema, no palpitations Respiratory: no cough, no sputum, no wheezing Gastrointestinal: no nausea, no vomiting, no diarrhea, no constipation Genitourinary: no urinary incontinence, no dysuria, no hematuria Musculoskeletal: no arthralgias, no myalgias Skin: no skin lesions, no pruritus, Neuro: + weakness, no loss of consciousness, no syncope Psych: no anxiety, no depression, + decrease appetite Heme/Lymph: no bruising, no bleeding  ED Course: Discussed with emergency medicine provider, patient requiring hospitalization for chief concerns of urinary tract infection failed outpatient therapy and acute kidney injury  Assessment/Plan  Principal Problem:   SIRS (systemic inflammatory response syndrome) (HCC) Active Problems:   UTI (urinary tract infection)   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   Renal cell carcinoma s/p nephrectomy, left   BPH with obstruction/lower urinary tract symptoms   Chronic kidney disease   Essential hypertension   GERD (gastroesophageal reflux disease)   Neurogenic bladder   Renal cell cancer (HCC)   Weakness   History of CVA (cerebrovascular accident)   Chronic indwelling Foley catheter   Heart block AV complete (HCC)   Dyslipidemia   AKI (acute kidney injury) ([place])   Leukocytosis   Heart failure with preserved ejection fraction (HCC)   Assessment and Plan:  * SIRS (systemic inflammatory  response syndrome) ([place]) Patient has elevated respiration rate, leukocytosis, source of urine Cefepime per pharmacy Check lactic acid x 2 Patient is status post sodium chloride 1 L  bolus per EDP Given hyponatremia, will order LR 1 L bolus Admit to telemetry cardiac, inpatient  UTI (urinary tract infection) Present on admission, complicated by chronic indwelling Foley catheter Status post ceftriaxone per EDP Patient has history of Pseudomonas UTI, cefepime per pharmacy has been ordered Urine culture in process  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 intake in setting of weakness Status post LR 1 L bolus per EDP Sodium chloride 100 mL/h, 1 day ordered Check BMP in a.m.  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   DVT prophylaxis: Heparin 5000 units subcutaneous every 8 hours Code Status: full code Diet: Heart healthy Family Communication: Attempted to call daughter Saheim Geary at [phone], no pickup Disposition Plan: Pending clinical  course Consults called: None at this time Admission status: Inpatient, telemetry cardiac  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[place])    Hypertension    Neurogenic bladder    Renal cell carcinoma ([place]) 2005   Renal disorder    Stroke ([place])     01/17/18, 12/21   Some weakness and cognitive decline   Past Surgical History:  Procedure Laterality Date   APPENDECTOMY     CATARACT EXTRACTION W/PHACO Right 07/22/2020   Procedure: CATARACT EXTRACTION PHACO AND INTRAOCULAR LENS PLACEMENT ([place]) RIGHT;  Surgeon: Eulogio Bear, MD;  Location: [place];  Service: Ophthalmology;  Laterality: Right;  4.77 0:44.2   CATARACT EXTRACTION W/PHACO Left 08/05/2020   Procedure: CATARACT EXTRACTION PHACO AND INTRAOCULAR LENS PLACEMENT ([facility]) LEFT 10.73 01:13.8;  Surgeon: Eulogio Bear, MD;  Location: [place] [facility];  Service: Ophthalmology;  Laterality: Left;  Use eye stretcher not chair   HERNIA REPAIR     LUMBAR FUSION     NEPHRECTOMY Left 2005   TEMPORARY PACEMAKER N/A 05/03/2022   Procedure: TEMPORARY PACEMAKER;  Surgeon: Yolonda Kida, MD;  Location: [facility];  Service: Cardiovascular;  Laterality: N/A;   Social History:  reports that he quit smoking about 19 years ago. His smoking use included cigarettes. He has a 2.50 pack-year smoking history. He has never used smokeless tobacco. He reports that he does not currently use alcohol. He  reports that he does not use drugs.  Allergies  Allergen Reactions   Empagliflozin     Recurrent UTI with sepsis on jardiance.   Fentanyl Nausea Only   Oxycodone Nausea And Vomiting   Family History  Problem Relation Age of Onset   Cerebral aneurysm Father    Cerebral aneurysm Sister    Family history: Family history reviewed and not pertinent.  Prior to Admission medications   Medication Sig Start  Date End Date Taking? Authorizing Provider  acetaminophen (TYLENOL) 325 MG tablet Take 500 mg by mouth every 6 (six) hours as needed. 08/14/14   [provider]  aspirin 81 MG EC tablet Take 81 mg by mouth daily as needed.    [provider]  atorvastatin (LIPITOR) 40 MG tablet Take 40 mg by mouth daily.    [provider]  diclofenac Sodium (VOLTAREN) 1 % GEL Apply 2 g topically 2 (two) times daily as needed. 05/11/21   [provider]  donepezil (ARICEPT) 10 MG tablet Take 10 mg by mouth at bedtime.    [provider]  finasteride (PROSCAR) 5 MG tablet Take 5 mg by mouth daily. 04/27/22   [provider]  furosemide (LASIX) 20 MG tablet Take 20 mg by mouth 2 (two) times daily.    [provider]  furosemide (LASIX) 20 MG tablet Take 2 tablets (40 mg total) by mouth 2 (two) times daily for 3 days. 06/06/22 06/09/22  Carrie Mew, MD  gabapentin (NEURONTIN) 300 MG capsule Take 1 capsule (300 mg total) by mouth at bedtime. 06/14/22   Monica Becton, MD  hydrocortisone cream ([employer] CORTISONE MAXIMUM STRENGTH) 1 % Apply 1 Application topically as needed for itching.    [provider]  lidocaine (LIDODERM) 5 % Place 2 patches onto the skin daily as needed. 09/04/21   [provider]  mirabegron ER (MYRBETRIQ) 25 MG TB24 tablet Take 25 mg by mouth daily.    [provider]  omeprazole (PRILOSEC) 20 MG capsule Take 1 capsule (20 mg total) by mouth daily. 05/21/21   Jennye Boroughs, MD  phenazopyridine (PYRIDIUM) 95 MG tablet Take 1 tablet (95 mg total) by mouth 3 (three) times daily as needed for pain. 06/16/22   Ward, Delice Bison, DO  potassium chloride SA (KLOR-CON M) 20 MEQ tablet Take 1 tablet (20 mEq total) by mouth 2 (two) times daily for 5 days. 06/06/22 06/11/22  Carrie Mew, MD  senna-docusate (SENOKOT-S) 8.6-50 MG tablet Take 1 tablet by mouth 2 (two) times daily. Patient taking differently: Take 1 tablet by  mouth 2 (two) times daily as needed. 09/10/21   Dwyane Dee, MD  tamsulosin (FLOMAX) 0.4 MG CAPS capsule Take 0.4 mg by mouth daily.    [provider]  traZODone (DESYREL) 50 MG tablet Take 50-100 mg by mouth at bedtime. Patient not taking: Reported on 06/11/2022 08/12/21   [provider]  vitamin B-12 (CYANOCOBALAMIN) 1000 MCG tablet Take 1,000 mcg by mouth daily.    [provider]   Physical Exam: Vitals:   08/09/22 1559 08/09/22 1600 08/09/22 1700 08/09/22 1801  BP: 99/72 118/73 112/66 111/82  Pulse: 76 78 79 81  Resp: 20 (!) '21 14 18  '$ Temp:    97.9 F (36.6 C)  TempSrc:      SpO2: 94% 93% 96% 100%  Weight:   99.3 kg    Constitutional: appears age appropriate, frail, NAD, calm, comfortable Eyes: PERRL, lids and conjunctivae normal ENMT: Mucous membranes are  dry. Posterior pharynx clear of any exudate or lesions.  Multiple teeth loss. Hearing appropriate Neck: normal, supple, no masses, no thyromegaly Respiratory: clear to auscultation bilaterally, no wheezing, no crackles. Normal respiratory effort. No accessory muscle use.  Cardiovascular: Regular rate and rhythm, no murmurs / rubs / gallops. No extremity edema. 2+ pedal pulses. No carotid bruits. Left anterior chest wall wound, appears well healed and chronic Abdomen: obese abdomen, no tenderness, no masses palpated, no hepatosplenomegaly. Bowel sounds positive.  Musculoskeletal: no clubbing / cyanosis. No joint deformity upper and lower extremities. Good ROM, no contractures, no atrophy. Normal muscle tone.  GU: Indwelling Foley catheter in place, with clear yellow urine Skin: no rashes, lesions, ulcers. No induration Neurologic: Sensation intact.  Generalized weakness Psychiatric: Normal judgment and insight. Alert and oriented x 3. Depressed mood.   EKG: independently reviewed, showing sinus rhythm with rate of 86, QTc 473  Chest x-ray on Admission: I personally reviewed and I agree  with  radiologist reading as below.  DG Chest Port 1 View  Result Date: 08/09/2022 CLINICAL DATA:  Sepsis EXAM: PORTABLE CHEST 1 VIEW COMPARISON:  Chest radiograph 07/10/2022 FINDINGS: Multi lead pacer apparatus overlies the left hemithorax, stable in position. Stable cardiomegaly. Aortic tortuosity and atherosclerosis. Persistent elevation right hemidiaphragm. Similar bilateral mid lower lung heterogeneous opacities. No pleural effusion or pneumothorax. Thoracic spine degenerative changes. IMPRESSION: Similar bilateral mid and lower lung heterogeneous opacities which may represent atelectasis or infection. Electronically Signed   By: Lovey Newcomer M.D.   On: 08/09/2022 18:35   CT ABDOMEN PELVIS WO CONTRAST  Result Date: 08/09/2022 CLINICAL DATA:  Abdominal and flank pain.  Kidney stones suspected. EXAM: CT ABDOMEN AND PELVIS WITHOUT CONTRAST TECHNIQUE: Multidetector CT imaging of the abdomen and pelvis was performed following the standard protocol without IV contrast. RADIATION DOSE REDUCTION: This exam was performed according to the departmental dose-optimization program which includes automated exposure control, adjustment of the mA and/or kV according to patient size and/or use of iterative reconstruction technique. COMPARISON:  04/30/2022. FINDINGS: Lower chest: Dependent atelectasis noted bilaterally. Ascending thoracic aorta is 4.6 cm diameter. Hepatobiliary: Stable 3.7 cm left hepatic cyst. No followup imaging is recommended. There is no evidence for gallstones, gallbladder wall thickening, or pericholecystic fluid. No intrahepatic or extrahepatic biliary dilation. Pancreas: No focal mass lesion. No dilatation of the main duct. No intraparenchymal cyst. No peripancreatic edema. Spleen: No splenomegaly. No focal mass lesion. Adrenals/Urinary Tract: No adrenal nodule or mass. Right kidney unremarkable. Left kidney surgically absent. Right ureter unremarkable. Bladder decompressed by Foley catheter. Gas in the  bladder lumen compatible with the instrumentation. Stomach/Bowel: Stomach is unremarkable. No gastric wall thickening. No evidence of outlet obstruction. Duodenum is normally positioned as is the ligament of Treitz. No small bowel wall thickening. No small bowel dilatation. The terminal ileum is normal. The appendix is not well visualized, but there is no edema or inflammation in the region of the cecum. No gross colonic mass. No colonic wall thickening. Vascular/Lymphatic: There is moderate atherosclerotic calcification of the abdominal aorta without aneurysm. There is no gastrohepatic or hepatoduodenal ligament lymphadenopathy. No retroperitoneal or mesenteric lymphadenopathy. No pelvic sidewall lymphadenopathy. Reproductive: Unremarkable. Other: No intraperitoneal free fluid. Musculoskeletal: No worrisome lytic or sclerotic osseous abnormality. Diffuse degenerative disc disease noted lumbar spine. IMPRESSION: 1. No acute findings in the abdomen or pelvis. Specifically, no findings to explain the patient's history of abdominal pain. 2. Status post left nephrectomy. 3. 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 Ascending thoracic aortic aneurysm. Recommend  semi-annual imaging followup by CTA or MRA and referral to cardiothoracic surgery if not already obtained. This recommendation follows 2010 ACCF/AHA/AATS/ACR/ASA/SCA/SCAI/SIR/STS/SVM Guidelines for the Diagnosis and Management of Patients With Thoracic Aortic Disease. Circulation. 2010; [id number]. Aortic aneurysm NOS (ICD10-I71.9) 4.  Aortic Atherosclerosis (ICD10-I70.0). Electronically Signed   By: Misty Stanley M.D.   On: 08/09/2022 16:34    Labs on Admission: I have personally reviewed following labs  CBC: Recent Labs  Lab 08/09/22 1407  WBC 14.4*  NEUTROABS 11.7*  HGB 13.0  HCT 40.6  MCV 81.5  PLT 123456   Basic Metabolic Panel: Recent Labs  Lab 08/09/22 1407  NA 127*  K 3.9  CL 87*  CO2 24  GLUCOSE 130*  BUN 75*  CREATININE 2.25*   CALCIUM 9.1   GFR: Estimated Creatinine Clearance: 31.9 mL/min (A) (by C-G formula based on SCr of 2.25 mg/dL (H)).  Liver Function Tests: Recent Labs  Lab 08/09/22 1407  AST 30  ALT 23  ALKPHOS 79  BILITOT 1.2  PROT 9.0*  ALBUMIN 4.3   Recent Labs  Lab 08/09/22 1407  LIPASE 49   Urine analysis:    Component Value Date/Time   COLORURINE STRAW (A) 08/09/2022 1407   APPEARANCEUR CLEAR (A) 08/09/2022 1407   LABSPEC 1.005 08/09/2022 1407   PHURINE 6.0 08/09/2022 1407   GLUCOSEU NEGATIVE 08/09/2022 1407   HGBUR SMALL (A) 08/09/2022 1407   BILIRUBINUR NEGATIVE 08/09/2022 1407   KETONESUR NEGATIVE 08/09/2022 1407   PROTEINUR NEGATIVE 08/09/2022 1407   NITRITE NEGATIVE 08/09/2022 1407   LEUKOCYTESUR LARGE (A) 08/09/2022 1407   This document was prepared using Dragon Voice Recognition software and may include unintentional dictation errors.  Dr. Tobie Poet [facility]  If 7PM-7AM, please contact overnight-coverage provider If 7AM-7PM, please contact day coverage provider [email or web address]  08/09/2022, 7:44 PM

## 2022-08-09 NOTE — Assessment & Plan Note (Signed)
Blood cultures x 2, lactic acid ordered x 2 Cefepime per pharmacy, patient has history of Pseudomonas UTI

## 2022-08-09 NOTE — Assessment & Plan Note (Addendum)
Suspecting AKI with history of CKD stage III A.  Baseline creatinine appears to be around 1.1-1.3. Suspect prerenal secondary to poor p.o. intake in setting of weakness Improving renal function with IV fluid. -Continue with IV fluid for another day.

## 2022-08-09 NOTE — ED Notes (Signed)
Patient transported to CT 

## 2022-08-09 NOTE — Assessment & Plan Note (Signed)
Fall precautions.  

## 2022-08-09 NOTE — Progress Notes (Signed)
       CROSS COVER NOTE  NAME: Adam Rios. MRN: [id number] DOB : 07/11/1940 ATTENDING PHYSICIAN: Cox, Briant Cedar, DO    Date of Service   08/09/2022   HPI/Events of Note   Report ***Lactic 2.7 On Review of chart *** Bedside eval*** HPI***  Interventions   Assessment/Plan:  561mL LR Bolus X    *** professional thanks      To reach the provider On-Call:   7AM- 7PM see care teams to locate the attending and reach out to them via [email or web address] Password: [email or web address] 7PM-7AM contact night-coverage If you still have difficulty reaching the appropriate provider, please page the [facility] (Director on Call) for [facility] on amion for assistance  This document was prepared using Systems analyst and may include unintentional dictation errors.  Neomia Glass DNP, MBA, FNP-BC, PMHNP-BC Nurse Practitioner [facility] Pager [phone]

## 2022-08-09 NOTE — Assessment & Plan Note (Signed)
Grade 1 diastolic dysfunction, does not appear to be in acute exacerbation however given the fluid boluses given on admission, strict I's and O's ordered

## 2022-08-09 NOTE — ED Provider Notes (Signed)
Care assumed of patient from outgoing provider.  See their note for initial history, exam and plan.  Clinical Course as of 08/09/22 1508  Adam Rios Aug 09, 2022  1502 Prior CVA, indwelling foley, 1 week of generalized weakness, UTI and started on macrobid from PCP.  UA c/w UTI. Rocephin and IVF. Ct pending and admission. [SM]    Clinical Course User Index [SM] Nathaniel Man, MD   CT w/o stone.  L nephrectomy. No acute findings. Admission for complicated UTI   Nathaniel Man, MD 08/09/22 1650

## 2022-08-09 NOTE — Assessment & Plan Note (Signed)
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

## 2022-08-09 NOTE — ED Notes (Signed)
Advised nurse that patient has ready bed 

## 2022-08-09 NOTE — Assessment & Plan Note (Signed)
Metoprolol succinate 25 mg daily resumed Home spironolactone 25 mg daily, torsemide, furosemide 20 mg p.o. twice daily not resumed on admission due to acute kidney injury Hydralazine 5 mg IV every 8 hours as needed for SBP greater 180, 5 days ordered

## 2022-08-09 NOTE — ED Provider Notes (Signed)
[facility] Provider Note    Event Date/Time   First MD Initiated Contact with Patient 08/09/22 1356     (approximate)   History   Chief Complaint: Weakness   HPI  Adam Helming. is a 82 y.o. male with a history of hypertension, renal cell carcinoma, stroke, chronic indwelling Foley, heart failure who was brought to the ED due to generalized weakness worsening for the past 10 days.  Went to PCP had a urine test done which showed a urinary tract infection.  Record review shows that he was started on Macrobid 5 days ago.  During this time he has not had improvement of symptoms and continued to worsen.  No falls or trauma.  No chest pain or shortness of breath.  He does complain of right lower back pain.  No dizziness or syncope.     Physical Exam   Triage Vital Signs: ED Triage Vitals  Enc Vitals Group     BP 08/09/22 1356 (!) 121/91     Pulse Rate 08/09/22 1356 85     Resp 08/09/22 1356 20     Temp 08/09/22 1356 (!) 97.4 F (36.3 C)     Temp Source 08/09/22 1356 Oral     SpO2 08/09/22 1356 93 %     Weight --      Height --      Head Circumference --      Peak Flow --      Pain Score 08/09/22 1355 8     Pain Loc --      Pain Edu? --      Excl. in [place]? --     Most recent vital signs: Vitals:   08/11/22 0417 08/11/22 0839  BP: 128/79 112/65  Pulse: 69 66  Resp: 18 18  Temp: 97.9 F (36.6 C) 98 F (36.7 C)  SpO2: 95% 94%    General: Awake, no distress.  CV:  Good peripheral perfusion.  Regular rate rhythm Resp:  Normal effort.  Clear to auscultation bilaterally Abd:    Soft, mildly distended.  Tympany to percussion.  Mild generalized tenderness. Other:  Dry mucous membranes   ED Results / Procedures / Treatments   Labs (all labs ordered are listed, but only abnormal results are displayed) Labs Reviewed  URINE CULTURE - Abnormal; Notable for the following components:      Result Value   Culture   (*)    Value: 20,000  COLONIES/mL ESCHERICHIA COLI SUSCEPTIBILITIES TO FOLLOW Performed at [facility], [place] [address], [place], [place] [address]    All other components within normal limits  COMPREHENSIVE METABOLIC PANEL - Abnormal; Notable for the following components:   Sodium 127 (*)    Chloride 87 (*)    Glucose, Bld 130 (*)    BUN 75 (*)    Creatinine, Ser 2.25 (*)    Total Protein 9.0 (*)    GFR, Estimated 29 (*)    Anion gap 16 (*)    All other components within normal limits  CBC WITH DIFFERENTIAL/PLATELET - Abnormal; Notable for the following components:   WBC 14.4 (*)    RDW 16.1 (*)    Neutro Abs 11.7 (*)    Abs Immature Granulocytes 0.10 (*)    All other components within normal limits  URINALYSIS, W/ REFLEX TO CULTURE (INFECTION SUSPECTED) - Abnormal; Notable for the following components:   Color, Urine STRAW (*)    APPearance CLEAR (*)  Hgb urine dipstick SMALL (*)    Leukocytes,Ua LARGE (*)    Bacteria, UA RARE (*)    All other components within normal limits  BASIC METABOLIC PANEL - Abnormal; Notable for the following components:   Sodium 129 (*)    Potassium 3.2 (*)    Chloride 93 (*)    Glucose, Bld 100 (*)    BUN 63 (*)    Creatinine, Ser 1.56 (*)    Calcium 8.7 (*)    GFR, Estimated 44 (*)    All other components within normal limits  CBC - Abnormal; Notable for the following components:   RBC 4.19 (*)    Hemoglobin 11.0 (*)    HCT 33.6 (*)    RDW 16.1 (*)    All other components within normal limits  VITAMIN B12 - Abnormal; Notable for the following components:   Vitamin B-12 1,151 (*)    All other components within normal limits  LACTIC ACID, PLASMA - Abnormal; Notable for the following components:   Lactic Acid, Venous 2.7 (*)    All other components within normal limits  CBC - Abnormal; Notable for the following components:   Hemoglobin 11.7 (*)    HCT 36.2 (*)    RDW 16.1 (*)    All other components within normal limits  BASIC METABOLIC PANEL -  Abnormal; Notable for the following components:   Sodium 130 (*)    Potassium 3.3 (*)    BUN 44 (*)    Calcium 8.7 (*)    GFR, Estimated 58 (*)    All other components within normal limits  TROPONIN I (HIGH SENSITIVITY) - Abnormal; Notable for the following components:   Troponin I (High Sensitivity) 54 (*)    All other components within normal limits  TROPONIN I (HIGH SENSITIVITY) - Abnormal; Notable for the following components:   Troponin I (High Sensitivity) 51 (*)    All other components within normal limits  CULTURE, BLOOD (ROUTINE X 2)  CULTURE, BLOOD (ROUTINE X 2)  LIPASE, BLOOD  LACTIC ACID, PLASMA  LACTIC ACID, PLASMA  MAGNESIUM     EKG    RADIOLOGY CT abdomen pelvis pending  PROCEDURES:  Procedures   MEDICATIONS ORDERED IN ED: Medications  acetaminophen (TYLENOL) tablet 650 mg (650 mg Oral Given 08/10/22 1307)    Or  acetaminophen (TYLENOL) suppository 650 mg ( Rectal See Alternative 08/10/22 1307)  ondansetron (ZOFRAN) tablet 4 mg (has no administration in time range)    Or  ondansetron (ZOFRAN) injection 4 mg (has no administration in time range)  heparin injection 5,000 Units (5,000 Units Subcutaneous Given 08/11/22 0600)  ceFEPIme (MAXIPIME) 2 g in sodium chloride 0.9 % 100 mL IVPB (2 g Intravenous New Bag/Given 08/11/22 0600)  hydrALAZINE (APRESOLINE) injection 5 mg (has no administration in time range)  0.9 %  sodium chloride infusion (0 mLs Intravenous Stopped 08/10/22 1948)  aspirin EC tablet 81 mg (81 mg Oral Given 08/10/22 0940)  atorvastatin (LIPITOR) tablet 40 mg (40 mg Oral Given 08/10/22 0941)  metoprolol succinate (TOPROL-XL) 24 hr tablet 25 mg (25 mg Oral Given 08/10/22 0941)  donepezil (ARICEPT) tablet 10 mg (10 mg Oral Given 08/10/22 2253)  senna-docusate (Senokot-S) tablet 1 tablet (has no administration in time range)  finasteride (PROSCAR) tablet 5 mg (5 mg Oral Given 08/10/22 0941)  mirabegron ER (MYRBETRIQ) tablet 25 mg (25 mg Oral Given  08/10/22 0941)  cyanocobalamin (VITAMIN B12) tablet 1,000 mcg (1,000 mcg Oral Given 08/10/22 0941)  cyclobenzaprine (FLEXERIL)  tablet 5-10 mg (10 mg Oral Given 08/09/22 2103)  gabapentin (NEURONTIN) capsule 300 mg (300 mg Oral Given 08/10/22 2253)  diclofenac Sodium (VOLTAREN) 1 % topical gel 2 g (has no administration in time range)  polyethylene glycol (MIRALAX / GLYCOLAX) packet 17 g (has no administration in time range)  Chlorhexidine Gluconate Cloth 2 % PADS 6 each (6 each Topical Given 08/11/22 0515)  sodium chloride 0.9 % bolus 1,000 mL (0 mLs Intravenous Stopped 08/09/22 1519)  cefTRIAXone (ROCEPHIN) 1 g in sodium chloride 0.9 % 100 mL IVPB (0 g Intravenous Stopped 08/09/22 1519)  lactated ringers bolus 1,000 mL (0 mLs Intravenous Stopped 08/09/22 1945)  lactated ringers bolus 500 mL (500 mLs Intravenous New Bag/Given 08/09/22 2314)  potassium chloride (KLOR-CON) packet 40 mEq (40 mEq Oral Given 08/10/22 0941)     IMPRESSION / MDM / ASSESSMENT AND PLAN / ED COURSE  I reviewed the triage vital signs and the nursing notes.  DDx: UTI with treatment failure, pyelonephritis, ureterolithiasis, diverticulitis, intra-abdominal abscess, GI perforation  Patient's presentation is most consistent with acute presentation with potential threat to life or bodily function.  Patient presents with generalized weakness in the setting of recently diagnosed UTI.  He also has back pain.  He is not septic. will need to obtain labs, urinalysis, CT abdomen pelvis.  Will give IV fluids for hydration   Clinical Course as of 08/11/22 0841  Sun Aug 09, 2022  1502 Prior CVA, indwelling foley, 1 week of generalized weakness, UTI and started on macrobid from PCP.  UA c/w UTI. Rocephin and IVF. Ct pending and admission. [SM]    Clinical Course User Index [SM] Nathaniel Man, MD     FINAL CLINICAL IMPRESSION(S) / ED DIAGNOSES   Final diagnoses:  Complicated UTI (urinary tract infection)     Rx / DC Orders    ED Discharge Orders     None        Note:  This document was prepared using Dragon voice recognition software and may include unintentional dictation errors.   Carrie Mew, MD 08/11/22 [id number]

## 2022-08-09 NOTE — Assessment & Plan Note (Signed)
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

## 2022-08-09 NOTE — Assessment & Plan Note (Signed)
Patient met sepsis criteria with leukocytosis, tachycardia and tachypnea.  Mildly elevated lactic acid which has been resolved.  Most likely secondary to UTI as UA being positive-pending urine cultures.  Preliminary blood cultures negative. -Continue with cefepime -Follow-up final culture results
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
## 2022-08-09 NOTE — Consult Note (Signed)
Pharmacy Antibiotic Note  Adam Rios. is a 82 y.o. male admitted on 08/09/2022 with UTI.  Pharmacy has been consulted for cefepime dosing.  Patient grew Pseudomonas and E. Coli from urine culture on 06/06/22.  Plan: Start cefepime 2 grams IV every 12 hours Follow renal function and cultures for adjustments     Temp (24hrs), Avg:97.4 F (36.3 C), Min:97.4 F (36.3 C), Max:97.4 F (36.3 C)  Recent Labs  Lab 08/09/22 1407  WBC 14.4*  CREATININE 2.25*    CrCl cannot be calculated (Unknown ideal weight.).    Allergies  Allergen Reactions   Fentanyl Nausea Only   Oxycodone Nausea And Vomiting    Antimicrobials this admission: cefepime 3/10 >>  ceftriaxone 3/10 x 1  Dose adjustments this admission: N/A  Microbiology results: 3/10 UCx: pending   Thank you for allowing pharmacy to be a part of this patient's care.  Lorin Picket, PharmD 08/09/2022 5:24 PM

## 2022-08-09 NOTE — ED Triage Notes (Signed)
Pt arrives via EMS from home for general weakness x2 weeks. PCP called him on Wednesday and started abx for UTI. Pt reports minimal improvement since starting medications. Pt arrives axox4 and able to answer questions appropriately.

## 2022-08-10 DIAGNOSIS — N390 Urinary tract infection, site not specified: Secondary | ICD-10-CM

## 2022-08-10 DIAGNOSIS — T83038A Leakage of other indwelling urethral catheter, initial encounter: Secondary | ICD-10-CM

## 2022-08-10 DIAGNOSIS — R6510 Systemic inflammatory response syndrome (SIRS) of non-infectious origin without acute organ dysfunction: Secondary | ICD-10-CM

## 2022-08-10 DIAGNOSIS — A419 Sepsis, unspecified organism: Secondary | ICD-10-CM | POA: Diagnosis not present

## 2022-08-10 LAB — BASIC METABOLIC PANEL
Anion gap: 11 (ref 5–15)
BUN: 63 mg/dL — ABNORMAL HIGH (ref 8–23)
CO2: 25 mmol/L (ref 22–32)
Calcium: 8.7 mg/dL — ABNORMAL LOW (ref 8.9–10.3)
Chloride: 93 mmol/L — ABNORMAL LOW (ref 98–111)
Creatinine, Ser: 1.56 mg/dL — ABNORMAL HIGH (ref 0.61–1.24)
GFR, Estimated: 44 mL/min — ABNORMAL LOW (ref >60)
Glucose, Bld: 100 mg/dL — ABNORMAL HIGH (ref 70–99)
Potassium: 3.2 mmol/L — ABNORMAL LOW (ref 3.5–5.1)
Sodium: 129 mmol/L — ABNORMAL LOW (ref 135–145)

## 2022-08-10 LAB — LACTIC ACID, PLASMA: Lactic Acid, Venous: 1.1 mmol/L (ref 0.5–1.9)

## 2022-08-10 LAB — CBC
HCT: 33.6 % — ABNORMAL LOW (ref 39.0–52.0)
Hemoglobin: 11 g/dL — ABNORMAL LOW (ref 13.0–17.0)
MCH: 26.3 pg (ref 26.0–34.0)
MCHC: 32.7 g/dL (ref 30.0–36.0)
MCV: 80.2 fL (ref 80.0–100.0)
Platelets: 207 10*3/uL (ref 150–400)
RBC: 4.19 MIL/uL — ABNORMAL LOW (ref 4.22–5.81)
RDW: 16.1 % — ABNORMAL HIGH (ref 11.5–15.5)
WBC: 10.4 10*3/uL (ref 4.0–10.5)
nRBC: 0 % (ref 0.0–0.2)

## 2022-08-10 LAB — MAGNESIUM: Magnesium: 2.4 mg/dL (ref 1.7–2.4)

## 2022-08-10 MED ORDER — POTASSIUM CHLORIDE 20 MEQ PO PACK
40.0000 meq | PACK | Freq: Once | ORAL | Status: AC
  Administered 2022-08-10: 40 meq via ORAL
  Filled 2022-08-10: qty 2

## 2022-08-10 NOTE — Evaluation (Signed)
Occupational Therapy Evaluation Patient Details Name: Adam Rios. MRN: [id number] DOB: 05-Oct-1940 Today's Date: 08/10/2022   History of Present Illness [name] is a 82 year old male with history of chronic indwelling Foley, dementia, hyperlipidemia, insomnia, chronic heart failure with preserved ejection fraction, history of right lower extremity cellulitis, who presents emergency department for chief concerns of generalized weakness for 2 weeks.     Patient reports he was diagnosed with UTI and prescribed Macrobid last Wednesday from his PCP.  He reports minimal improvement since then.   Clinical Impression   Patient presenting with decreased Ind in self care,balance, functional mobility/transfers, endurance, and safety awareness. Patient reports living at home with family.Pt has PCA that assist with self care but unsure of how often they come to the home. Pt endorses sleeping in lift chair and ambulating to bathroom with RW but hasn't done so for "some time". Pt is unable to verbalize the last time he walked to bathroom. No family present to confirm baseline.  Patient needing total A for bed mobility as he does not give any effort to assist.  Patient will benefit from acute OT to increase overall independence in the areas of ADLs, functional mobility, and safety awareness in order to safely discharge to next venue of care.      Recommendations for follow up therapy are one component of a multi-disciplinary discharge planning process, led by the attending physician.  Recommendations may be updated based on patient status, additional functional criteria and insurance authorization.   Follow Up Recommendations  Skilled nursing-short term rehab (<3 hours/day)     Assistance Recommended at Discharge Intermittent Supervision/Assistance  Patient can return home with the following Two people to help with walking and/or transfers;A lot of help with bathing/dressing/bathroom;Assistance with  cooking/housework;Assist for transportation;Help with stairs or ramp for entrance    Functional Status Assessment  Patient has had a recent decline in their functional status and demonstrates the ability to make significant improvements in function in a reasonable and predictable amount of time.  Equipment Recommendations  Other (comment) (defer to next venue of care)       Precautions / Restrictions Precautions Precautions: Fall      Mobility Bed Mobility Overal bed mobility: Needs Assistance Bed Mobility: Rolling Rolling: Total assist         General bed mobility comments: pt give no physical effort to assist with mobility    Transfers                   General transfer comment: not attempted secondary to safety concerns          ADL either performed or assessed with clinical judgement   ADL Overall ADL's : Needs assistance/impaired                                       General ADL Comments: Pt able to wash face and hands with set up A while in bed. He does not attempt to assist with any bed mobility requiring total A for task.     Vision Patient Visual Report: No change from baseline              Pertinent Vitals/Pain Pain Assessment Pain Assessment: Faces Faces Pain Scale: Hurts little more Pain Location: back Pain Descriptors / Indicators: Aching, Discomfort Pain Intervention(s): Repositioned, Monitored during session     Hand Dominance Right  Extremity/Trunk Assessment Upper Extremity Assessment Upper Extremity Assessment: Generalized weakness   Lower Extremity Assessment Lower Extremity Assessment: Generalized weakness       Communication Communication Communication: No difficulties   Cognition Arousal/Alertness: Awake/alert Behavior During Therapy: Flat affect Overall Cognitive Status: No family/caregiver present to determine baseline cognitive functioning                                 General  Comments: Pt is oriented to self and location only. He reports it is " December" and having some difficulty answering questions in regards PLOF and home set up                [place] expects to be discharged to:: Private residence Living Arrangements: Children;Other (Comment) (daughter, son in law, and 2 grandchildren) Available Help at Discharge: Family;Available 24 hours/day ("son in law sleeps a lot but if I hollar he will come") Type of Home: House Home Access: Stairs to enter [employer] of Steps: 1 partial step Entrance Stairs-Rails: None Home Layout: Able to live on main level with bedroom/bathroom     Bathroom Shower/Tub: Walk-in shower         Home Equipment: Air cabin crew (4 wheels);Wheelchair - manual;Grab bars - toilet;Grab bars - tub/shower          Prior Functioning/Environment Prior Level of Function : Needs assist             Mobility Comments: Pt reports he sleeps in a lift chair but is able to ambulate to/from bathroom without assistance, uses doorframe to negotiate single step into house. ADLs Comments: assist for bathing/LBD- has PCA but unable to verbalize how much assistance he is needing or how often they come.  Pt is unable to verbalize when he was last able to ambulate to bathroom.        OT Problem List: Decreased strength;Decreased activity tolerance;Decreased safety awareness;Impaired balance (sitting and/or standing);Decreased knowledge of use of DME or AE;Pain      OT Treatment/Interventions: Self-care/ADL training;Therapeutic exercise;Therapeutic activities;Energy conservation;DME and/or AE instruction;Balance training;Patient/family education    OT Goals(Current goals can be found in the care plan section) Acute Rehab OT Goals Patient Stated Goal: to go home OT Goal Formulation: With patient Time For Goal Achievement: 08/24/22 Potential to Achieve Goals: Fair ADL Goals Pt Will Perform Grooming:  with min assist;standing Pt Will Transfer to Toilet: with min assist;stand pivot transfer Pt Will Perform Toileting - Clothing Manipulation and hygiene: with min assist;sit to/from stand  OT Frequency: Min 2X/week       AM-PAC OT "6 Clicks" Daily Activity     Outcome Measure Help from another person eating meals?: None Help from another person taking care of personal grooming?: A Little Help from another person toileting, which includes using toliet, bedpan, or urinal?: Total Help from another person bathing (including washing, rinsing, drying)?: Total Help from another person to put on and taking off regular upper body clothing?: A Lot Help from another person to put on and taking off regular lower body clothing?: Total 6 Click Score: 12   End of Session Nurse Communication: Mobility status  Activity Tolerance: Patient limited by fatigue Patient left: in bed;with call bell/phone within reach;with bed alarm set  OT Visit Diagnosis: Muscle weakness (generalized) (M62.81);Pain Pain - part of body:  (back)                Time: 1131-1150 OT Time  Calculation (min): 19 min Charges:  OT General Charges $OT Visit: 1 Visit OT Evaluation $OT Eval Moderate Complexity: 1 Mod OT Treatments $Therapeutic Activity: 8-22 mins  Darleen Crocker, MS, OTR/L , CBIS ascom [phone]  08/10/22, 1:15 PM

## 2022-08-10 NOTE — Assessment & Plan Note (Signed)
Patient with chronic Foley catheter which was changed in ED yesterday, 08/09/2022.

## 2022-08-10 NOTE — Progress Notes (Signed)
PT Cancellation Note  Patient Details Name: Adam Rios. MRN: [id number] DOB: Jul 05, 1940   Cancelled Treatment:    Reason Eval/Treat Not Completed: Fatigue/lethargy limiting ability to participate (Consult received and chart reviewed. Per primary RN, patient lethargic, confused and generally unable to participate with skilled evaluation/mobility assessment.  Will continue to follow and re-attempt/initiate as appropriate.)   Britney Captain H. Owens Shark, PT, DPT, NCS 08/10/22, 12:26 PM [phone]

## 2022-08-10 NOTE — Progress Notes (Signed)
Progress Note   Patient: Adam Rios. EI:[id number] DOB: 1941-01-06 DOA: 08/09/2022     1 DOS: the patient was seen and examined on 08/10/2022   Brief hospital course: Mr. Zadarius Marn is a 82 year old male with history of chronic indwelling Foley, dementia, hyperlipidemia, insomnia, chronic heart failure with preserved ejection fraction, history of right lower extremity cellulitis, who presents emergency department for chief concerns of generalized weakness for 2 weeks.  Patient reports he was diagnosed with UTI and prescribed Macrobid last Wednesday from his PCP.  He reports minimal improvement since then.  Vitals in the ED showed temperature of 97.4, respiration rate 20, heart rate 85, blood pressure 121/91, SpO2 of 93% on room air.  Serum sodium is 127, potassium 3.9, chloride 87, bicarb 24, BUN of 75, serum creatinine of 2.25, EGFR 29, nonfasting blood glucose 130, WBC 14.4, hemoglobin 13, platelets of 253.  High sensitive troponin was 54 and on repeat was 51.  UA was positive for large leukocytes.  ED treatment: Ceftriaxone 1 g IV one-time dose, sodium chloride 1 L bolus. Patient was started on cefepime based on prior history of urine cultures positive for Pseudomonas.  3/11: Remained afebrile with resolution of leukocytosis but all cell lines decreased, so some dilutional effect.  Sodium with some improvement 129, hypokalemia with potassium of 3.2 which is being repleted.  Remained very lethargic and weak.  Pending PT/OT evaluation  Assessment and Plan: * Sepsis [facility]) Patient met sepsis criteria with leukocytosis, tachycardia and tachypnea.  Mildly elevated lactic acid which has been resolved.  Most likely secondary to UTI as UA being positive-pending urine cultures.  Preliminary blood cultures negative. -Continue with cefepime -Follow-up final culture results  UTI (urinary tract infection) Present on admission, complicated by chronic indwelling Foley catheter Status post  ceftriaxone per EDP Patient has history of Pseudomonas UTI, cefepime per pharmacy has been ordered Urine culture in process  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 Home senna docusate twice daily as needed for mild constipation MiraLAX daily as needed for moderate constipation, 4 days ordered  BPH with obstruction/lower urinary tract symptoms Patient with chronic Foley catheter which was changed in ED yesterday, 08/09/2022.  AKI (acute kidney injury) ([place]) Suspecting AKI with history of CKD stage III A.  Baseline creatinine appears to be around 1.1-1.3. Suspect prerenal secondary to poor p.o. intake in setting of weakness Improving renal function with IV fluid. -Continue with IV fluid for another day.  Chronic kidney disease CKD 3A is patient's baseline, serum creatinine 1.40/eGFR 50  Essential hypertension Metoprolol succinate 25 mg daily resumed Home spironolactone 25 mg daily, torsemide, furosemide 20 mg p.o. 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  Denies any nausea, vomiting or diarrhea.  No pain.  Physical Exam: Vitals:   08/09/22 1801  08/09/22 1953 08/10/22 0312 08/10/22 0836  BP: 111/82 107/65 115/66 102/73  Pulse: 81 80 76 76   Resp: '18 18 18 19  '$ Temp: 97.9 F (36.6 C) 97.8 F (36.6 C) 99.3 F (37.4 C) 98.3 F (36.8 C)  TempSrc:    Oral  SpO2: 100% 96% 93% 95%  Weight:       General.  Chronically ill appearing, frail gentleman, in no acute distress. Pulmonary.  Lungs clear bilaterally, normal respiratory effort. CV.  Regular rate and rhythm, no JVD, rub or murmur. Abdomen.  Soft, nontender, nondistended, BS positive. CNS.  Lethargic.  No focal neurologic deficit. Extremities.  No edema, no cyanosis, pulses intact and symmetrical. Psychiatry.  Judgment and insight appears normal.   Data Reviewed: Prior data reviewed.  Family Communication: Daughter on phone.  Disposition: Status is: Inpatient Remains inpatient appropriate because: Severity of illness  Planned Discharge Destination: Home   Time spent: 45 minutes  This record has been created using Systems analyst. Errors have been sought and corrected,but may not always be located. Such creation errors do not reflect on the standard of care.   Author: Lorella Nimrod, MD 08/10/2022 1:00 PM  For on call review [email or web address].

## 2022-08-10 NOTE — Consult Note (Signed)
Urology Consult  I have been asked to see the patient by Dr. Reesa Chew, for evaluation and management of urinary leakage around Foley.  Chief Complaint: Urinary leakage around Foley  History of Present Illness: Adam Rios. is a 82 y.o. year old male with a complex urologic history including BPH with urinary retention s/p HOLEP at [facility] in January, now with Foley catheter in place, admitted since [id number] with complicated UTI.  Urine culture growing low colony counts of E. coli, susceptibilities to follow.  Blood cultures pending with no growth at <12 hours.  He is on antibiotics as below.  Notably, his catheter was exchanged in the emergency department upon arrival.  He was bladder scanned earlier today with PVR 0 mL.  14 French Foley catheter in place today draining clear, yellow urine. He reports he thinks his urinary leakage is worse "lately," but is unsure of this, and is unsure when things may have worsened.  On review of his chart, he was reporting significant urinary leakage following postop Foley removal with [facility] in January.  Ultimately, a Foley catheter was replaced in our ED on 07/10/2022, unclear if he was back in retention at this point or if this was placed purely for management of urinary incontinence.  It appears he has had a catheter in place consistently since that time and he has continued to report significant urinary leakage around the catheter tubing since that time.  He is taking Myrbetriq 25 mg daily.   Anti-infectives (From admission, onward)    Start     Dose/Rate Route Frequency Ordered Stop   08/09/22 1800  ceFEPIme (MAXIPIME) 2 g in sodium chloride 0.9 % 100 mL IVPB        2 g 200 mL/hr over 30 Minutes Intravenous Every 12 hours 08/09/22 1749     08/09/22 1415  cefTRIAXone (ROCEPHIN) 1 g in sodium chloride 0.9 % 100 mL IVPB        1 g 200 mL/hr over 30 Minutes Intravenous  Once 08/09/22 1408 08/09/22 1519       Past Medical History:  Diagnosis Date    Cognitive impairment    Complication of anesthesia    Fentanyl causes nausea   Hemorrhagic cerebrovascular accident (CVA) ([place])    Hypertension    Neurogenic bladder    Renal cell carcinoma ([place]) 2005   Renal disorder    Stroke ([place])     01/17/18, 12/21   Some weakness and cognitive decline    Past Surgical History:  Procedure Laterality Date   APPENDECTOMY     CATARACT EXTRACTION W/PHACO Right 07/22/2020   Procedure: CATARACT EXTRACTION PHACO AND INTRAOCULAR LENS PLACEMENT ([place]) RIGHT;  Surgeon: Eulogio Bear, MD;  Location: [place];  Service: Ophthalmology;  Laterality: Right;  4.77 0:44.2   CATARACT EXTRACTION W/PHACO Left 08/05/2020   Procedure: CATARACT EXTRACTION PHACO AND INTRAOCULAR LENS PLACEMENT ([facility]) LEFT 10.73 01:13.8;  Surgeon: Eulogio Bear, MD;  Location: [place];  Service: Ophthalmology;  Laterality: Left;  Use eye stretcher not chair   HERNIA REPAIR     LUMBAR FUSION     NEPHRECTOMY Left 2005   TEMPORARY PACEMAKER N/A 05/03/2022   Procedure: TEMPORARY PACEMAKER;  Surgeon: Yolonda Kida, MD;  Location: [place] CV [facility];  Service: Cardiovascular;  Laterality: N/A;    Home Medications:  Current Meds  Medication Sig   acetaminophen (TYLENOL) 325 MG tablet Take 500 mg by mouth every 6 (six) hours as needed.  aspirin 81 MG EC tablet Take 81 mg by mouth daily as needed.   atorvastatin (LIPITOR) 40 MG tablet Take 40 mg by mouth daily.   cyclobenzaprine (FLEXERIL) 5 MG tablet Take 5-10 mg by mouth 3 (three) times daily as needed for muscle spasms.   diclofenac Sodium (VOLTAREN) 1 % GEL Apply 2 g topically 2 (two) times daily as needed.   donepezil (ARICEPT) 10 MG tablet Take 10 mg by mouth at bedtime.   finasteride (PROSCAR) 5 MG tablet Take 5 mg by mouth daily.   furosemide (LASIX) 20 MG tablet Take 20 mg by mouth 2 (two) times daily.   gabapentin (NEURONTIN) 300 MG capsule Take 1 capsule (300 mg total) by mouth at bedtime.    hydrocortisone cream ([employer] CORTISONE MAXIMUM STRENGTH) 1 % Apply 1 Application topically as needed for itching.   lidocaine (LIDODERM) 5 % Place 2 patches onto the skin daily as needed.   metolazone (ZAROXOLYN) 2.5 MG tablet Take 2.5 mg by mouth as directed.   metoprolol succinate (TOPROL-XL) 25 MG 24 hr tablet Take 25 mg by mouth daily.   mirabegron ER (MYRBETRIQ) 25 MG TB24 tablet Take 25 mg by mouth daily.   nitrofurantoin (MACRODANTIN) 100 MG capsule Take 100 mg by mouth 2 (two) times daily with a meal.   omeprazole (PRILOSEC) 20 MG capsule Take 1 capsule (20 mg total) by mouth daily.   phenazopyridine (PYRIDIUM) 95 MG tablet Take 1 tablet (95 mg total) by mouth 3 (three) times daily as needed for pain.   spironolactone (ALDACTONE) 25 MG tablet Take 25 mg by mouth daily.   torsemide (DEMADEX) 20 MG tablet Take 40 mg by mouth 2 (two) times daily.   vitamin B-12 (CYANOCOBALAMIN) 1000 MCG tablet Take 1,000 mcg by mouth daily.   [DISCONTINUED] senna-docusate (SENOKOT-S) 8.6-50 MG tablet Take 1 tablet by mouth 2 (two) times daily. (Patient taking differently: Take 1 tablet by mouth 2 (two) times daily as needed.)    Allergies:  Allergies  Allergen Reactions   Empagliflozin     Recurrent UTI with sepsis on jardiance.   Fentanyl Nausea Only   Oxycodone Nausea And Vomiting    Family History  Problem Relation Age of Onset   Cerebral aneurysm Father    Cerebral aneurysm Sister     Social History:  reports that he quit smoking about 19 years ago. His smoking use included cigarettes. He has a 2.50 pack-year smoking history. He has never used smokeless tobacco. He reports that he does not currently use alcohol. He reports that he does not use drugs.  ROS: A complete review of systems was performed.  All systems are negative except for pertinent findings as noted.  Physical Exam:  Vital signs in last 24 hours: Temp:  [97.8 F (36.6 C)-99.3 F (37.4 C)] 98.3 F (36.8 C) (03/11 0836) Pulse  Rate:  [76-81] 76 (03/11 0836) Resp:  [18-19] 19 (03/11 0836) BP: (102-115)/(65-82) 102/73 (03/11 0836) SpO2:  [93 %-100 %] 95 % (03/11 0836) Constitutional:  Alert, no acute distress HEENT: [place] AT, moist mucus membranes Cardiovascular: No clubbing, cyanosis, or edema Respiratory: Normal respiratory effort GU: Bilateral descended testicles.  No scrotal edema, erythema, fluctuance, or crepitus.  Scrotum is moist from leaking urine.  No significant enlargement of the bilateral testes or epididymides, though he reports tenderness with scrotal exam. Skin: No rashes, bruises or suspicious lesions Neurologic: Grossly intact, no focal deficits, moving all 4 extremities Psychiatric: Normal mood and affect  Laboratory Data:  Recent  Labs    08/09/22 1407 08/10/22 0649  WBC 14.4* 10.4  HGB 13.0 11.0*  HCT 40.6 33.6*   Recent Labs    08/09/22 1407 08/10/22 0649  NA 127* 129*  K 3.9 3.2*  CL 87* 93*  CO2 24 25  GLUCOSE 130* 100*  BUN 75* 63*  CREATININE 2.25* 1.56*  CALCIUM 9.1 8.7*   No results for input(s): "LABPT", "INR" in the last 72 hours. No results for input(s): "LABURIN" in the last 72 hours. Urinalysis    Component Value Date/Time   COLORURINE STRAW (A) 08/09/2022 1407   APPEARANCEUR CLEAR (A) 08/09/2022 1407   LABSPEC 1.005 08/09/2022 1407   PHURINE 6.0 08/09/2022 1407   GLUCOSEU NEGATIVE 08/09/2022 1407   HGBUR SMALL (A) 08/09/2022 1407   BILIRUBINUR NEGATIVE 08/09/2022 1407   KETONESUR NEGATIVE 08/09/2022 1407   PROTEINUR NEGATIVE 08/09/2022 1407   NITRITE NEGATIVE 08/09/2022 1407   LEUKOCYTESUR LARGE (A) 08/09/2022 1407   Results for orders placed or performed during the hospital encounter of 08/09/22  Urine Culture     Status: Abnormal (Preliminary result)   Collection Time: 08/09/22  2:07 PM   Specimen: Urine, Random  Result Value Ref Range Status   Specimen Description   Final    URINE, RANDOM Performed at [facility], [address]  [place], [place] [address]    Special Requests   Final    NONE Reflexed from [id number] Performed at [facility], [address] [place], [place] [address]    Culture (A)  Final    20,000 COLONIES/mL ESCHERICHIA COLI SUSCEPTIBILITIES TO FOLLOW Performed at [facility], [place] [address] [place], [place] [address]    Report Status PENDING  Incomplete  Culture, blood (Routine X 2) w Reflex to ID Panel     Status: None (Preliminary result)   Collection Time: 08/09/22  6:17 PM   Specimen: BLOOD  Result Value Ref Range Status   Specimen Description BLOOD BLOOD RIGHT HAND  Final   Special Requests   Final    BOTTLES DRAWN AEROBIC AND ANAEROBIC Blood Culture adequate volume   Culture   Final    NO GROWTH < 12 HOURS Performed at [facility], [address], [place], [place] [address]    Report Status PENDING  Incomplete  Culture, blood (Routine X 2) w Reflex to ID Panel     Status: None (Preliminary result)   Collection Time: 08/09/22  6:23 PM   Specimen: BLOOD  Result Value Ref Range Status   Specimen Description BLOOD BLOOD LEFT HAND  Final   Special Requests   Final    BOTTLES DRAWN AEROBIC AND ANAEROBIC Blood Culture adequate volume   Culture   Final    NO GROWTH < 12 HOURS Performed at [facility], [address] [place], [place] [address]    Report Status PENDING  Incomplete    Radiologic Imaging: DG Chest Port 1 View  Result Date: 08/09/2022 CLINICAL DATA:  Sepsis EXAM: PORTABLE CHEST 1 VIEW COMPARISON:  Chest radiograph 07/10/2022 FINDINGS: Multi lead pacer apparatus overlies the left hemithorax, stable in position. Stable cardiomegaly. Aortic tortuosity and atherosclerosis. Persistent elevation right hemidiaphragm. Similar bilateral mid lower lung heterogeneous opacities. No pleural effusion or pneumothorax. Thoracic spine degenerative changes. IMPRESSION: Similar bilateral mid and lower lung heterogeneous opacities which may represent  atelectasis or infection. Electronically Signed   By: Lovey Newcomer M.D.   On: 08/09/2022 18:35   CT ABDOMEN PELVIS WO CONTRAST  Result Date: 08/09/2022 CLINICAL DATA:  Abdominal and flank pain.  Kidney stones suspected. EXAM: CT ABDOMEN AND PELVIS WITHOUT CONTRAST TECHNIQUE: Multidetector CT imaging of the abdomen and pelvis was performed following the standard protocol without IV contrast. RADIATION DOSE REDUCTION: This exam was performed according to the departmental dose-optimization program which includes automated exposure control, adjustment of the mA and/or kV according to patient size and/or use of iterative reconstruction technique. COMPARISON:  04/30/2022. FINDINGS: Lower chest: Dependent atelectasis noted bilaterally. Ascending thoracic aorta is 4.6 cm diameter. Hepatobiliary: Stable 3.7 cm left hepatic cyst. No followup imaging is recommended. There is no evidence for gallstones, gallbladder wall thickening, or pericholecystic fluid. No intrahepatic or extrahepatic biliary dilation. Pancreas: No focal mass lesion. No dilatation of the main duct. No intraparenchymal cyst. No peripancreatic edema. Spleen: No splenomegaly. No focal mass lesion. Adrenals/Urinary Tract: No adrenal nodule or mass. Right kidney unremarkable. Left kidney surgically absent. Right ureter unremarkable. Bladder decompressed by Foley catheter. Gas in the bladder lumen compatible with the instrumentation. Stomach/Bowel: Stomach is unremarkable. No gastric wall thickening. No evidence of outlet obstruction. Duodenum is normally positioned as is the ligament of Treitz. No small bowel wall thickening. No small bowel dilatation. The terminal ileum is normal. The appendix is not well visualized, but there is no edema or inflammation in the region of the cecum. No gross colonic mass. No colonic wall thickening. Vascular/Lymphatic: There is moderate atherosclerotic calcification of the abdominal aorta without aneurysm. There is no  gastrohepatic or hepatoduodenal ligament lymphadenopathy. No retroperitoneal or mesenteric lymphadenopathy. No pelvic sidewall lymphadenopathy. Reproductive: Unremarkable. Other: No intraperitoneal free fluid. Musculoskeletal: No worrisome lytic or sclerotic osseous abnormality. Diffuse degenerative disc disease noted lumbar spine. IMPRESSION: 1. No acute findings in the abdomen or pelvis. Specifically, no findings to explain the patient's history of abdominal pain. 2. Status post left nephrectomy. 3. 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 Ascending thoracic aortic aneurysm. Recommend semi-annual imaging followup by CTA or MRA and referral to cardiothoracic surgery if not already obtained. This recommendation follows 2010 ACCF/AHA/AATS/ACR/ASA/SCA/SCAI/SIR/STS/SVM Guidelines for the Diagnosis and Management of Patients With Thoracic Aortic Disease. Circulation. 2010; [id number]. Aortic aneurysm NOS (ICD10-I71.9) 4.  Aortic Atherosclerosis (ICD10-I70.0). Electronically Signed   By: Misty Stanley M.D.   On: 08/09/2022 16:34    Assessment & Plan:  82 year old male with a complex urologic history s/p HOLEP at [facility] in January currently admitted with complicated UTI.  Urology has been consulted for evaluation of urinary leakage.  It appears urinary leakage has been an ongoing problem since HOLEP in January.  He has had a Foley catheter in place since last month, unclear if this was for management of postop urinary retention versus urinary incontinence.  He is on Myrbetriq 25 mg daily, likely for bladder spasms.  Ultimately, would recommend deferring to his primary urologic team at [facility] for further management, as it is unclear why he has a Foley catheter in place at this time.  If for management of urinary leakage, consideration of condom catheter would be reasonable as an alternative.  If for management of urinary retention, could consider increasing Myrbetriq to 50 mg daily for management of bladder spasms  which would be the likely underlying mechanism for urinary leakage around his catheter tubing.  No significant findings on physical exam today.  No indication for urgent urologic intervention at this time.  Thank you for involving me in this patient's care, please page with any further questions or concerns.  Debroah Loop, PA-C 08/10/2022 5:13 PM

## 2022-08-11 DIAGNOSIS — A419 Sepsis, unspecified organism: Secondary | ICD-10-CM | POA: Diagnosis not present

## 2022-08-11 LAB — BASIC METABOLIC PANEL
Anion gap: 6 (ref 5–15)
BUN: 44 mg/dL — ABNORMAL HIGH (ref 8–23)
CO2: 26 mmol/L (ref 22–32)
Calcium: 8.7 mg/dL — ABNORMAL LOW (ref 8.9–10.3)
Chloride: 98 mmol/L (ref 98–111)
Creatinine, Ser: 1.24 mg/dL (ref 0.61–1.24)
GFR, Estimated: 58 mL/min — ABNORMAL LOW (ref >60)
Glucose, Bld: 87 mg/dL (ref 70–99)
Potassium: 3.3 mmol/L — ABNORMAL LOW (ref 3.5–5.1)
Sodium: 130 mmol/L — ABNORMAL LOW (ref 135–145)

## 2022-08-11 LAB — CBC
HCT: 36.2 % — ABNORMAL LOW (ref 39.0–52.0)
Hemoglobin: 11.7 g/dL — ABNORMAL LOW (ref 13.0–17.0)
MCH: 26.4 pg (ref 26.0–34.0)
MCHC: 32.3 g/dL (ref 30.0–36.0)
MCV: 81.5 fL (ref 80.0–100.0)
Platelets: 210 10*3/uL (ref 150–400)
RBC: 4.44 MIL/uL (ref 4.22–5.81)
RDW: 16.1 % — ABNORMAL HIGH (ref 11.5–15.5)
WBC: 8.7 10*3/uL (ref 4.0–10.5)
nRBC: 0 % (ref 0.0–0.2)

## 2022-08-11 LAB — URINE CULTURE: Culture: 20000 — AB

## 2022-08-11 MED ORDER — CEFAZOLIN SODIUM-DEXTROSE 1-4 GM/50ML-% IV SOLN
1.0000 g | Freq: Three times a day (TID) | INTRAVENOUS | Status: DC
  Administered 2022-08-11 – 2022-08-13 (×6): 1 g via INTRAVENOUS
  Filled 2022-08-11 (×7): qty 50

## 2022-08-11 MED ORDER — POTASSIUM CHLORIDE 20 MEQ PO PACK
40.0000 meq | PACK | Freq: Once | ORAL | Status: AC
  Administered 2022-08-11: 40 meq via ORAL
  Filled 2022-08-11: qty 2

## 2022-08-11 MED ORDER — SODIUM CHLORIDE 0.9 % IV SOLN: INTRAVENOUS | Status: DC

## 2022-08-11 NOTE — Assessment & Plan Note (Addendum)
Patient met sepsis criteria with leukocytosis, tachycardia and tachypnea and acute kidney injury.  Mildly elevated lactic acid which has been resolved.  Most likely secondary to UTI as UA being positive-urine cultures with E. coli.  So far blood cultures are negative.  Urine culture did grow out a large colony count.  Will give a short course of treatment.  Switched to p.o. Keflex on 3/14.

## 2022-08-11 NOTE — Assessment & Plan Note (Signed)
Blood cultures remain negative.  Lactic acidosis has been resolved.  Leukocytosis has been resolved.  Urine cultures with E. Coli. Management as above

## 2022-08-11 NOTE — Progress Notes (Signed)
Progress Note   Patient: Adam Rios. [id number] DOB: 1940-09-05 DOA: 08/09/2022     2 DOS: the patient was seen and examined on 08/11/2022   Brief hospital course: [name] is a 82 year old male with history of chronic indwelling Foley, dementia, hyperlipidemia, insomnia, chronic heart failure with preserved ejection fraction, history of right lower extremity cellulitis, who presents emergency department for chief concerns of generalized weakness for 2 weeks.  Patient reports he was diagnosed with UTI and prescribed Macrobid last Wednesday from his PCP.  He reports minimal improvement since then.  Vitals in the ED showed temperature of 97.4, respiration rate 20, heart rate 85, blood pressure 121/91, SpO2 of 93% on room air.  Serum sodium is 127, potassium 3.9, chloride 87, bicarb 24, BUN of 75, serum creatinine of 2.25, EGFR 29, nonfasting blood glucose 130, WBC 14.4, hemoglobin 13, platelets of 253.  High sensitive troponin was 54 and on repeat was 51.  UA was positive for large leukocytes.  ED treatment: Ceftriaxone 1 g IV one-time dose, sodium chloride 1 L bolus. Patient was started on cefepime based on prior history of urine cultures positive for Pseudomonas.  3/11: Remained afebrile with resolution of leukocytosis but all cell lines decreased, so some dilutional effect.  Sodium with some improvement 129, hypokalemia with potassium of 3.2 which is being repleted.  Remained very lethargic and weak.  Pending PT/OT evaluation.  3/12: Vital stable.  Concern of urinary leak around the Foley catheter.  Urology was consulted, seems to be chronic.  Patient also has a recent procedure at [facility].  Not sure why he is on chronic Foley catheter.  Patient need to have a close follow-up with his urologist at [facility] for further recommendations.  Apparently struggling with this leak for some time and using depends at home.  OT is recommending SNF.  Pending PT evaluation. Labs with slowly  improving sodium and potassium at 130 and 3.3 today.  Creatinine now improved to 1.24 which is close to baseline.  CBC stable.  Urine cultures with Adam Rios resistant to Cipro and Bactrim.  Cefepime switched to cefazolin.  Giving some more normal saline.  Assessment and Plan: * Sepsis [facility]) Patient met sepsis criteria with leukocytosis, tachycardia and tachypnea.  Mildly elevated lactic acid which has been resolved.  Most likely secondary to UTI as UA being positive-urine cultures with Adam Rios.  Preliminary blood cultures negative. -Switching cefepime with cefazolin. -Follow-up final blood culture results  UTI (urinary tract infection) Present on admission, complicated by chronic indwelling Foley catheter Urine cultures with Adam Rios, only resistant to Cipro and Bactrim. 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  AKI (acute kidney injury) ([place]) Suspecting AKI with history of CKD stage III A.  Baseline creatinine appears to be around 1.1-1.3.  Creatinine improving with IV fluid, today at 1.24 which is around his baseline. -Continue with IV fluid for another day.  Chronic kidney disease CKD 3A is patient's baseline, serum creatinine 1.40/eGFR 50  Essential hypertension Metoprolol succinate 25 mg daily resumed Home spironolactone 25 mg daily, torsemide, furosemide 20 mg p.o. twice daily not resumed on admission due to acute kidney injury Hydralazine 5 mg IV every 8 hours as needed for SBP greater 180, 5 days ordered  History of CVA (cerebrovascular accident) Fall precautions  Neurogenic bladder Patient has a history of neurogenic bladder and having some urinary leakage around Foley catheter which  also seems chronic.  Follow-up at [facility]. We also consulted urology and they are recommending follow-up with his own urologist.  Nothing else to offer for persistent leakage around Foley catheter. Bladder scan with no residual urine.  Heart failure with preserved ejection fraction (HCC) Grade 1 diastolic dysfunction, does not appear to be in acute exacerbation however given the fluid boluses given on admission, strict I's and O's ordered  Leukocytosis Blood cultures remain negative.  Lactic acidosis has been resolved.  Leukocytosis has been resolved.  Urine cultures with Adam Rios. 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  Patient wants to go home but also stating that his daughter might not be able to take care of him. -TOC consult  Renal cell cancer ([place]) Status post left nephrectomy in 1984   Subjective: Patient was sitting in chair when seen today.  Feeling little improved.  He wants to go home but also stating that his daughter might not be able to take care of him.  Encouraged going to rehab for short duration will help.  Physical Exam: Vitals:   08/10/22 1700 08/10/22 2101 08/11/22 0417 08/11/22 0839  BP: 107/66 100/71 128/79 112/65  Pulse: 76 95 69 66  Resp: '18 20 18 18  '$ Temp: 98.3 F (36.8 C) 98.3 F (36.8 C) 97.9 F (36.6 C) 98 F (36.7 C)  TempSrc: Oral Oral Oral   SpO2: 96% 96% 95% 94%  Weight:       General.  Frail elderly man, in no acute distress. Pulmonary.  Lungs clear bilaterally, normal respiratory effort. CV.  Regular rate and rhythm, no JVD, rub or murmur. Abdomen.  Soft, nontender, nondistended, BS positive. CNS.  Alert and oriented .  No focal neurologic deficit. Extremities.  No edema, no cyanosis, pulses intact and symmetrical. Psychiatry.  Judgment and insight appears normal.    Data  Reviewed: Prior data reviewed.  Family Communication: Discussed with daughter on phone.  Disposition: Status is: Inpatient Remains inpatient appropriate because: Severity of illness  Planned Discharge Destination: Home   Time spent: 44 minutes  This record has been created using Systems analyst. Errors have been sought and corrected,but may not always be located. Such creation errors do not reflect on the standard of care.   Author: Lorella Nimrod, MD 08/11/2022 1:24 PM  For on call review [email or web address].

## 2022-08-11 NOTE — Plan of Care (Addendum)
Patient [name], disoriented to time and situation.  VSS throughout shift.  All meds given on time as ordered.  Diminished lungs, IS encouraged.  Foley care provided, leakage continues.  CHG wiped full body.  Pt cleaned, linen changed.  POC maintained, will continue to monitor.  Problem: Education: Goal: Knowledge of General Education information will improve Description: Including pain rating scale, medication(s)/side effects and non-pharmacologic comfort measures Outcome: Progressing   Problem: Health Behavior/Discharge Planning: Goal: Ability to manage health-related needs will improve Outcome: Progressing   Problem: Clinical Measurements: Goal: Ability to maintain clinical measurements within normal limits will improve Outcome: Progressing Goal: Will remain free from infection Outcome: Progressing Goal: Diagnostic test results will improve Outcome: Progressing Goal: Respiratory complications will improve Outcome: Progressing Goal: Cardiovascular complication will be avoided Outcome: Progressing   Problem: Activity: Goal: Risk for activity intolerance will decrease Outcome: Progressing   Problem: Nutrition: Goal: Adequate nutrition will be maintained Outcome: Progressing   Problem: Coping: Goal: Level of anxiety will decrease Outcome: Progressing   Problem: Elimination: Goal: Will not experience complications related to bowel motility Outcome: Progressing Goal: Will not experience complications related to urinary retention Outcome: Progressing   Problem: Pain Managment: Goal: General experience of comfort will improve Outcome: Progressing   Problem: Safety: Goal: Ability to remain free from injury will improve Outcome: Progressing   Problem: Skin Integrity: Goal: Risk for impaired skin integrity will decrease Outcome: Progressing

## 2022-08-11 NOTE — Evaluation (Signed)
Physical Therapy Evaluation Patient Details Name: Adam Rios. MRN: [id number] DOB: [birth year] Today's Date: 08/11/2022  History of Present Illness  Adam Rios is a 82 year old male with history of chronic indwelling Foley, dementia, hyperlipidemia, insomnia, chronic heart failure with preserved ejection fraction, history of right lower extremity cellulitis, who presents emergency department for chief concerns of generalized weakness for 2 weeks.     Patient reports he was diagnosed with UTI and prescribed Macrobid last Wednesday from his PCP.  He reports minimal improvement since then.  Clinical Impression  Pt HOH but willing to participate with PT, some general confusion and lack of situational and PLOF awareness.  He did have some issues with catheter leaking, RN notified.  He needed assist with all aspects of mobility, despite some hesitancy was able to stand, turn and take a few small forward steps with plenty of cuing/encouragement - heavy reliance on walker.  Unsure how much ambulation, etc he is doing at baseline but he did manage to walk ~22f when he was here ~3 months ago, appears to be far from his likely baseline.    Pt will benefit from continued PT to address these functional limitations.      Recommendations for follow up therapy are one component of a multi-disciplinary discharge planning process, led by the attending physician.  Recommendations may be updated based on patient status, additional functional criteria and insurance authorization.  Follow Up Recommendations Skilled nursing-short term rehab (<3 hours/day) Can patient physically be transported by private vehicle: No    Assistance Recommended at Discharge Frequent or constant Supervision/Assistance  Patient can return home with the following  A lot of help with walking and/or transfers;A lot of help with bathing/dressing/bathroom;Assistance with cooking/housework;Assist for transportation;Help with stairs or  ramp for entrance    Equipment Recommendations None recommended by PT  Recommendations for Other Services       Functional Status Assessment Patient has had a recent decline in their functional status and demonstrates the ability to make significant improvements in function in a reasonable and predictable amount of time.     Precautions / Restrictions Precautions Precautions: Fall Restrictions Weight Bearing Restrictions: No      Mobility  Bed Mobility Overal bed mobility: Needs Assistance Bed Mobility: Supine to Sit     Supine to sit: Max assist, Mod assist     General bed mobility comments: Pt was able to give some assist but struggled with getting to EOB and needed heavy assist with transition to sitting.    Transfers Overall transfer level: Needs assistance Equipment used: Rolling walker (2 wheels) Transfers: Sit to/from Stand Sit to Stand: Min assist, From elevated surface, Mod assist           General transfer comment: heavy cuing for set up and to insure keeping weight forward and up during transition.    Ambulation/Gait Ambulation/Gait assistance: Min assist, Mod assist Gait Distance (Feet): 4 Feet Assistive device: Rolling walker (2 wheels)         General Gait Details: Pt was able to take a few small, turning steps toward recliner and then a few forward steps with heavy reliance on walker, much cuing and encouragement - pt quicikly requests to sit after minimal distance  Stairs            Wheelchair Mobility    Modified Rankin (Stroke Patients Only)       Balance Overall balance assessment: Needs assistance   Sitting balance-Leahy Scale: Fair Sitting balance -  Comments: able to maintain sitting balance EOB w/o direct assist but needing close supervision   Standing balance support: Bilateral upper extremity supported Standing balance-Leahy Scale: Poor Standing balance comment: Pt highly reliant on UEs/AD, poor standing tolerance - no  LOBs                             Pertinent Vitals/Pain Pain Assessment Pain Assessment: No/denies pain    Home Living Family/patient expects to be discharged to:: Private residence Living Arrangements: Children;Other (Comment) Available Help at Discharge: Family;Available 24 hours/day Type of Home: House Home Access: Stairs to enter Entrance Stairs-Rails: None Entrance Stairs-Number of Steps: 1 partial step   Home Layout: Able to live on main level with bedroom/bathroom Home Equipment: Air cabin crew (4 wheels);Wheelchair - manual;Grab bars - toilet;Grab bars - tub/shower Additional Comments: lives with daughter, son in Sports [place] and grand children    Prior Function Prior Level of Function : Needs assist             Mobility Comments: Pt reports he sleeps in a lift chair but is able to ambulate to/from bathroom without assistance, uses doorframe to negotiate single step into house. ADLs Comments: assist for bathing/LBD- has PCA but unable to verbalize how much assistance he is needing or how often they come.     Hand Dominance   Dominant Hand: Right    Extremity/Trunk Assessment   Upper Extremity Assessment Upper Extremity Assessment: Generalized weakness    Lower Extremity Assessment Lower Extremity Assessment: Generalized weakness       Communication   Communication: No difficulties  Cognition Arousal/Alertness: Awake/alert Behavior During Therapy: Flat affect Overall Cognitive Status: No family/caregiver present to determine baseline cognitive functioning                                 General Comments: Pt is oriented to self, did not know why he's here and only vaguely intermittently where he is (hospital vs...).        General Comments      Exercises     Assessment/Plan    PT Assessment Patient needs continued PT services  PT Problem List Decreased range of motion;Decreased strength;Decreased activity  tolerance;Decreased balance;Decreased mobility;Decreased cognition;Decreased knowledge of use of DME;Decreased safety awareness       PT Treatment Interventions DME instruction;Stair training;Gait training;Functional mobility training;Therapeutic activities;Therapeutic exercise;Balance training;Cognitive remediation;Patient/family education    PT Goals (Current goals can be found in the Care Plan section)  Acute Rehab PT Goals Patient Stated Goal: get catheter figured out PT Goal Formulation: With patient Time For Goal Achievement: 08/24/22 Potential to Achieve Goals: Fair    Frequency Min 2X/week     Co-evaluation               AM-PAC PT "6 Clicks" Mobility  Outcome Measure Help needed turning from your back to your side while in a flat bed without using bedrails?: A Little Help needed moving from lying on your back to sitting on the side of a flat bed without using bedrails?: A Little Help needed moving to and from a bed to a chair (including a wheelchair)?: A Lot Help needed standing up from a chair using your arms (e.g., wheelchair or bedside chair)?: A Little Help needed to walk in hospital room?: A Lot Help needed climbing 3-5 steps with a railing? : Total 6 Click Score: 14  End of Session Equipment Utilized During Treatment: Gait belt Activity Tolerance: Patient limited by fatigue Patient left: with chair alarm set;with call bell/phone within reach Nurse Communication: Mobility status PT Visit Diagnosis: Muscle weakness (generalized) (M62.81);Difficulty in walking, not elsewhere classified (R26.2);Unsteadiness on feet (R26.81)    Time: [id number] PT Time Calculation (min) (ACUTE ONLY): 27 min   Charges:   PT Evaluation $PT Eval Low Complexity: 1 Low PT Treatments $Therapeutic Activity: 8-22 mins        Kreg Shropshire, DPT 08/11/2022, 2:37 PM

## 2022-08-11 NOTE — NC FL2 (Signed)
[place] LEVEL OF CARE FORM     IDENTIFICATION  Patient Name: Adam Rios. Birthdate: [birth year] Sex: male Admission Date (Current Location): 08/09/2022  [facility] and [place] Number:  Engineering geologist and Address:         Provider Number: [id number]  Attending Physician Name and Address:  Lorella Nimrod, MD  Relative Name and Phone Number:       Current Level of Care: Hospital Recommended Level of Care: [place] Prior Approval Number:    Date Approved/Denied:   PASRR Number: [id number]  Discharge Plan: SNF    Current Diagnoses: Patient Active Problem List   Diagnosis Date Noted   AKI (acute kidney injury) ([place]) 08/09/2022   Leukocytosis 08/09/2022   Sepsis ([place]) 08/09/2022   Heart failure with preserved ejection fraction ([place]) 08/09/2022   Acute hemorrhagic cystitis 06/11/2022   Iron deficiency anemia due to chronic blood loss 06/11/2022   Dyslipidemia 06/11/2022   GERD without esophagitis 06/11/2022   Lower extremity cellulitis 06/11/2022   Heart block AV complete ([place]) 05/03/2022   Second degree heart block 05/02/2022   Acute on chronic congestive heart failure ([place]) 04/30/2022   Acute on chronic diastolic CHF (congestive heart failure) ([place]) 04/30/2022   Obesity (BMI 30-39.9) 04/30/2022   Chronic indwelling Foley catheter 04/30/2022   Septic shock ([place]) 02/11/2022   Pressure injury of skin 02/11/2022   Bacteremia 11/22/2021   Hematuria 11/21/2021   Renal cell carcinoma s/p nephrectomy, left 11/21/2021   History of recurrent UTI and ESBL UTI  11/21/2021   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   UTI (urinary tract infection) 09/09/2021   Neuropathy 09/09/2021   History of CVA (cerebrovascular accident) 09/09/2021   Fall 05/14/2021   Frailty 03/04/2018   Acute encephalopathy 01/30/2018   Chronic kidney disease 04/05/2017   GERD (gastroesophageal reflux disease) 04/05/2017   Pain 04/05/2017    Weakness 04/05/2017   Recurrent UTI 01/20/2016   Essential hypertension 07/31/2014   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 04/10/2014   Ventral hernia without obstruction or gangrene 04/10/2014   Acute low back pain without sciatica 12/26/2013   Fusion of spine of lumbar region 12/26/2013   Anasarca 09/26/2013   Hemorrhoid 09/26/2013   Neurogenic bladder 09/26/2013   Renal cell cancer ([place]) 09/26/2013   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   Lower urinary tract infectious disease 10/28/2012    Orientation RESPIRATION BLADDER Height & Weight        Normal Indwelling catheter Weight: 99.3 kg Height:     BEHAVIORAL SYMPTOMS/MOOD NEUROLOGICAL BOWEL NUTRITION STATUS      Continent Diet (Heart Healthy)  AMBULATORY STATUS COMMUNICATION OF NEEDS Skin   Limited Assist Verbally Skin abrasions, Bruising                       Personal Care Assistance Level of Assistance              Functional Limitations Info             SPECIAL CARE FACTORS FREQUENCY  PT (By licensed PT), OT (By licensed OT)                    Contractures Contractures Info: Not present    Additional Factors Info  Code Status, Allergies, Isolation Precautions Code Status Info: Full Allergies Info: Empagliflozin, Fentanyl, Oxycodone     Isolation Precautions Info: Contact ESBL MRSA     Current Medications (08/11/2022):  This is  the current hospital active medication list Current Facility-Administered Medications  Medication Dose Route Frequency Provider Last Rate Last Admin   0.9 %  sodium chloride infusion   Intravenous Continuous Lorella Nimrod, MD 100 mL/hr at 08/11/22 1018 New Bag at 08/11/22 1018   acetaminophen (TYLENOL) tablet 650 mg  650 mg Oral Q6H PRN Cox, Amy N, DO   650 mg at 08/10/22 1307   Or   acetaminophen (TYLENOL) suppository 650 mg  650 mg Rectal Q6H PRN Cox, Amy N, DO       aspirin EC tablet 81 mg  81 mg Oral Daily Cox, Amy N, DO   81 mg at 08/11/22 1020   atorvastatin (LIPITOR)  tablet 40 mg  40 mg Oral Daily Cox, Amy N, DO   40 mg at 08/11/22 1020   ceFAZolin (ANCEF) IVPB 1 g/50 mL premix  1 g Intravenous Q8H Lorin Picket, RPH 100 mL/hr at 08/11/22 1409 1 g at 08/11/22 1409   Chlorhexidine Gluconate Cloth 2 % PADS 6 each  6 each Topical Q0600 Cox, Amy N, DO   6 each at 08/11/22 0515   cyanocobalamin (VITAMIN B12) tablet 1,000 mcg  1,000 mcg Oral Daily Cox, Amy N, DO   1,000 mcg at 08/11/22 1020   cyclobenzaprine (FLEXERIL) tablet 5-10 mg  5-10 mg Oral TID PRN Cox, Amy N, DO   10 mg at 08/09/22 2103   diclofenac Sodium (VOLTAREN) 1 % topical gel 2 g  2 g Topical BID PRN Cox, Amy N, DO       donepezil (ARICEPT) tablet 10 mg  10 mg Oral QHS Cox, Amy N, DO   10 mg at 08/10/22 2253   finasteride (PROSCAR) tablet 5 mg  5 mg Oral Daily Cox, Amy N, DO   5 mg at 08/11/22 1020   gabapentin (NEURONTIN) capsule 300 mg  300 mg Oral QHS Cox, Amy N, DO   300 mg at 08/10/22 2253   heparin injection 5,000 Units  5,000 Units Subcutaneous Q8H Cox, Amy N, DO   5,000 Units at 08/11/22 1409   hydrALAZINE (APRESOLINE) injection 5 mg  5 mg Intravenous Q8H PRN Cox, Amy N, DO       metoprolol succinate (TOPROL-XL) 24 hr tablet 25 mg  25 mg Oral Daily Cox, Amy N, DO   25 mg at 08/11/22 1020   mirabegron ER (MYRBETRIQ) tablet 25 mg  25 mg Oral Daily Cox, Amy N, DO   25 mg at 08/11/22 1020   ondansetron (ZOFRAN) tablet 4 mg  4 mg Oral Q6H PRN Cox, Amy N, DO       Or   ondansetron (ZOFRAN) injection 4 mg  4 mg Intravenous Q6H PRN Cox, Amy N, DO       polyethylene glycol (MIRALAX / GLYCOLAX) packet 17 g  17 g Oral Daily PRN Cox, Amy N, DO       senna-docusate (Senokot-S) tablet 1 tablet  1 tablet Oral BID PRN Cox, Amy N, DO         Discharge Medications: Please see discharge summary for a list of discharge medications.  Relevant Imaging Results:  Relevant Lab Results:   Additional Information SS#: [id number].  Beverly Sessions, RN

## 2022-08-11 NOTE — Assessment & Plan Note (Signed)
Patient has a history of neurogenic bladder and having some urinary leakage around Foley catheter which also seems chronic.  Follow-up at [facility]. We also consulted urology and they are recommending follow-up with his own urologist.  Nothing else to offer for persistent leakage around Foley catheter. Bladder scan with no residual urine.

## 2022-08-11 NOTE — Assessment & Plan Note (Addendum)
PT/OT are recommending SNF.  Patient this morning declined rehab again this morning.  Did only slightly better with mobility specialist today.  Patient will likely need transportation home since he has 2 steps to get into the house.

## 2022-08-11 NOTE — Assessment & Plan Note (Addendum)
Present on admission, complicated by chronic indwelling Foley catheter Urine cultures with E. Coli.  Switched to p.o. Keflex on 3/14.

## 2022-08-11 NOTE — Assessment & Plan Note (Addendum)
AKI with history of CKD stage III A.  Creatinine 2.25 on presentation and down to 1.17.

## 2022-08-11 NOTE — Consult Note (Signed)
Pharmacy Antibiotic Note  Fayez Billips. is a 82 y.o. male admitted on 08/09/2022 with UTI.  Pharmacy has been consulted for cefazolin dosing.  Plan: Start cefazolin 1 grams IV every 8 hours Follow renal function for adjustments  Weight: 99.3 kg (219 lb)  Temp (24hrs), Avg:98.1 F (36.7 C), Min:97.9 F (36.6 C), Max:98.3 F (36.8 C)  Recent Labs  Lab 08/09/22 1407 08/09/22 1817 08/09/22 2149 08/10/22 0139 08/10/22 0649 08/11/22 0553  WBC 14.4*  --   --   --  10.4 8.7  CREATININE 2.25*  --   --   --  1.56* 1.24  LATICACIDVEN  --  1.7 2.7* 1.1  --   --      Estimated Creatinine Clearance: 58 mL/min (by C-G formula based on SCr of 1.24 mg/dL).    Allergies  Allergen Reactions   Empagliflozin     Recurrent UTI with sepsis on jardiance.   Fentanyl Nausea Only   Oxycodone Nausea And Vomiting    Antimicrobials this admission: Cefazolin 3/12>> cefepime 3/10 >>3/12 ceftriaxone 3/10 x 1  Microbiology results: 3/10 UCx: E. Coli (Resistant to ciprofloxacin and smx/tmp)  Thank you for allowing pharmacy to be a part of this patient's care.  Lorin Picket, PharmD 08/11/2022 9:53 AM

## 2022-08-11 NOTE — TOC Initial Note (Signed)
Transition of Care (TOC) - Initial/Assessment Note    Patient Details  Name: Adam Rios. MRN: [id number] Date of Birth: 1941-02-10  Transition of Care [facility]) CM/SW Contact:    Beverly Sessions, RN Phone Number: 08/11/2022, 4:52 PM  Clinical Narrative:                  Admitted for: sepsis Admitted from: home with daughter PCP: [place] Current home health/prior home health/DME: [facility], [facility], [place]   Therapy recommending SNF. Patient disoriented x4. Spoke with daughter and she is in agreement for SNF Fl2 sent for signature Bed search initiated          Patient Goals and CMS Choice            Expected Discharge Plan and Services                                              Prior Living Arrangements/Services                       Activities of Daily Living      Permission Sought/Granted                  Emotional Assessment              Admission diagnosis:  AKI (acute kidney injury) ([place]) [N17.9] Patient Active Problem List   Diagnosis Date Noted   AKI (acute kidney injury) ([place]) 08/09/2022   Leukocytosis 08/09/2022   Sepsis ([place]) 08/09/2022   Heart failure with preserved ejection fraction ([place]) 08/09/2022   Acute hemorrhagic cystitis 06/11/2022   Iron deficiency anemia due to chronic blood loss 06/11/2022   Dyslipidemia 06/11/2022   GERD without esophagitis 06/11/2022   Lower extremity cellulitis 06/11/2022   Heart block AV complete ([place]) 05/03/2022   Second degree heart block 05/02/2022   Acute on chronic congestive heart failure ([place]) 04/30/2022   Acute on chronic diastolic CHF (congestive heart failure) ([place]) 04/30/2022   Obesity (BMI 30-39.9) 04/30/2022   Chronic indwelling Foley catheter 04/30/2022   Septic shock ([place]) 02/11/2022   Pressure injury of skin 02/11/2022   Bacteremia 11/22/2021   Hematuria 11/21/2021   Renal cell carcinoma s/p nephrectomy, left 11/21/2021    History of recurrent UTI and ESBL UTI  11/21/2021   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   UTI (urinary tract infection) 09/09/2021   Neuropathy 09/09/2021   History of CVA (cerebrovascular accident) 09/09/2021   Fall 05/14/2021   Frailty 03/04/2018   Acute encephalopathy 01/30/2018   Chronic kidney disease 04/05/2017   GERD (gastroesophageal reflux disease) 04/05/2017   Pain 04/05/2017   Weakness 04/05/2017   Recurrent UTI 01/20/2016   Essential hypertension 07/31/2014   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 AT [facility] OF [address] [place]  [place] [address] Phone: [phone] Fax: [phone]     Social Determinants of Health (SDOH) Social History: [place]: No Food Insecurity (08/09/2022)  Housing: Low Risk  (08/09/2022)  Transportation Needs: No Transportation Needs (08/09/2022)  Utilities: Not At Risk (08/09/2022)  Tobacco Use: Medium Risk (08/09/2022)   SDOH Interventions:     Readmission Risk Interventions    06/11/2022    2:19 PM 02/12/2022    2:17 PM  Readmission Risk Prevention Plan  Transportation Screening Complete Complete  PCP or Specialist Appt within 3-5 Days Complete Complete  HRI or Home Care Consult Complete Complete  Social Work Consult for [place] Planning/Counseling Complete Complete  Palliative Care Screening Not Applicable Not Applicable  Medication Review Press photographer) Complete Referral to Pharmacy

## 2022-08-12 DIAGNOSIS — K592 Neurogenic bowel, not elsewhere classified: Secondary | ICD-10-CM

## 2022-08-12 DIAGNOSIS — Z8673 Personal history of transient ischemic attack (TIA), and cerebral infarction without residual deficits: Secondary | ICD-10-CM

## 2022-08-12 DIAGNOSIS — I10 Essential (primary) hypertension: Secondary | ICD-10-CM

## 2022-08-12 DIAGNOSIS — K5900 Constipation, unspecified: Secondary | ICD-10-CM | POA: Diagnosis not present

## 2022-08-12 DIAGNOSIS — R6520 Severe sepsis without septic shock: Secondary | ICD-10-CM

## 2022-08-12 DIAGNOSIS — N179 Acute kidney failure, unspecified: Secondary | ICD-10-CM

## 2022-08-12 DIAGNOSIS — N401 Enlarged prostate with lower urinary tract symptoms: Secondary | ICD-10-CM | POA: Diagnosis not present

## 2022-08-12 DIAGNOSIS — N319 Neuromuscular dysfunction of bladder, unspecified: Secondary | ICD-10-CM

## 2022-08-12 DIAGNOSIS — R531 Weakness: Secondary | ICD-10-CM

## 2022-08-12 DIAGNOSIS — I5032 Chronic diastolic (congestive) heart failure: Secondary | ICD-10-CM

## 2022-08-12 DIAGNOSIS — N390 Urinary tract infection, site not specified: Secondary | ICD-10-CM | POA: Diagnosis not present

## 2022-08-12 DIAGNOSIS — A419 Sepsis, unspecified organism: Secondary | ICD-10-CM | POA: Diagnosis not present

## 2022-08-12 DIAGNOSIS — N138 Other obstructive and reflux uropathy: Secondary | ICD-10-CM

## 2022-08-12 LAB — BASIC METABOLIC PANEL
Anion gap: 7 (ref 5–15)
BUN: 34 mg/dL — ABNORMAL HIGH (ref 8–23)
CO2: 22 mmol/L (ref 22–32)
Calcium: 8.7 mg/dL — ABNORMAL LOW (ref 8.9–10.3)
Chloride: 104 mmol/L (ref 98–111)
Creatinine, Ser: 1.15 mg/dL (ref 0.61–1.24)
GFR, Estimated: >60 mL/min (ref >60)
Glucose, Bld: 92 mg/dL (ref 70–99)
Potassium: 3.3 mmol/L — ABNORMAL LOW (ref 3.5–5.1)
Sodium: 133 mmol/L — ABNORMAL LOW (ref 135–145)

## 2022-08-12 NOTE — Assessment & Plan Note (Signed)
-   Continue Toprol 

## 2022-08-12 NOTE — Assessment & Plan Note (Signed)
Status post left nephrectomy in 1984

## 2022-08-12 NOTE — Progress Notes (Signed)
Progress Note   Patient: Adam Rios. [id number] DOB: 1940/11/28 DOA: 08/09/2022     3 DOS: the patient was seen and examined on 08/12/2022   Brief hospital course: [name] is a 82 year old male with history of chronic indwelling Foley, dementia, hyperlipidemia, insomnia, chronic heart failure with preserved ejection fraction, history of right lower extremity cellulitis, who presents emergency department for chief concerns of generalized weakness for 2 weeks.  Patient reports he was diagnosed with UTI and prescribed Macrobid last Wednesday from his PCP.  He reports minimal improvement since then.  Vitals in the ED showed temperature of 97.4, respiration rate 20, heart rate 85, blood pressure 121/91, SpO2 of 93% on room air.  Serum sodium is 127, potassium 3.9, chloride 87, bicarb 24, BUN of 75, serum creatinine of 2.25, EGFR 29, nonfasting blood glucose 130, WBC 14.4, hemoglobin 13, platelets of 253.  High sensitive troponin was 54 and on repeat was 51.  UA was positive for large leukocytes.  ED treatment: Ceftriaxone 1 g IV one-time dose, sodium chloride 1 L bolus. Patient was started on cefepime based on prior history of urine cultures positive for Pseudomonas.  3/11: Remained afebrile with resolution of leukocytosis but all cell lines decreased, so some dilutional effect.  Sodium with some improvement 129, hypokalemia with potassium of 3.2 which is being repleted.  Remained very lethargic and weak.  Pending PT/OT evaluation.  3/12: Vital stable.  Concern of urinary leak around the Foley catheter.  Urology was consulted, seems to be chronic.  Patient also has a recent procedure at [facility].  Not sure why he is on chronic Foley catheter.  Patient need to have a close follow-up with his urologist at [facility] for further recommendations.  Apparently struggling with this leak for some time and using depends at home.  OT is recommending SNF.  Pending PT evaluation. Labs with slowly  improving sodium and potassium at 130 and 3.3 today.  Creatinine now improved to 1.24 which is close to baseline.  CBC stable.  Urine cultures with E. coli resistant to Cipro and Bactrim.  Cefepime switched to cefazolin.  Giving some more normal saline.  3/13.  Patient not wanting to go to rehab.  He will need to be stronger in order to go home.  Assessment and Plan: * Severe sepsis [facility]) Patient met sepsis criteria with leukocytosis, tachycardia and tachypnea and acute kidney injury.  Mildly elevated lactic acid which has been resolved.  Most likely secondary to UTI as UA being positive-urine cultures with E. coli.  So far blood cultures are negative.  Urine culture did grow out a large colony count.  Will give a short course of treatment.  Complicated UTI (urinary tract infection) Present on admission, complicated by chronic indwelling Foley catheter Urine cultures with E. Coli.  Continue cefazolin.  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  AKI (acute kidney injury) ([place]) AKI with history of CKD stage III A.  Creatinine 2.25 on presentation and down to 1.15 today.  Essential hypertension Continue Toprol  History of CVA (cerebrovascular accident) Fall precautions  Neurogenic bladder Patient has a history of neurogenic bladder and having some urinary leakage around Foley catheter which also seems chronic.  Follow-up at [facility]. We also consulted urology and they are recommending follow-up with his own urologist.  Nothing else to offer  for persistent leakage around Foley catheter. Bladder scan with no residual urine.  Heart failure with preserved ejection fraction (HCC) Grade 1 diastolic dysfunction, does not appear to be in acute exacerbation however given the fluid boluses given on admission,  strict I's and O's ordered  Leukocytosis Blood cultures remain negative.  Lactic acidosis has been resolved.  Leukocytosis has been resolved.  Urine cultures with E. Coli. 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  Patient this morning declined rehab.  Transitional care team and patient's daughter to speak with him again.  I will speak with him again tomorrow.  Will have to be stronger in order to go home.  Renal cell cancer ([place]) Status post left nephrectomy in 1984        Subjective: Patient feels okay.  Does not offer any specific complaints.  Still feeling a little weak.  Only walked 4 feet yesterday.  Physical Exam: Vitals:   08/11/22 1750 08/11/22 2017 08/12/22 0433 08/12/22 0800  BP: (!) 124/107 113/62 108/62 123/62  Pulse: 75 67 63 62  Resp: '18 18 16 18  '$ Temp:  98.8 F (37.1 C) 97.8 F (36.6 C) 97.9 F (36.6 C)  TempSrc:      SpO2: 99% 96% 93% 95%  Weight:       Physical Exam HENT:     Head: Normocephalic.     Mouth/Throat:     Pharynx: No oropharyngeal exudate.  Eyes:     General: Lids are normal.     Conjunctiva/sclera: Conjunctivae normal.  Cardiovascular:     Rate and Rhythm: Normal rate and regular rhythm.     Heart sounds: Normal heart sounds, S1 normal and S2 normal.  Pulmonary:     Breath sounds: No decreased breath sounds, wheezing, rhonchi or rales.  Abdominal:     Palpations: Abdomen is soft.     Tenderness: There is no abdominal tenderness.  Musculoskeletal:     Right lower leg: No swelling.     Left lower leg: No swelling.  Skin:    General: Skin is warm.     Findings: No rash.  Neurological:     Mental Status: He is alert.     Comments: Answers questions appropriately.  Able to straight leg raise.     Data Reviewed: Sodium 133, potassium 3.3, creatinine 1.15  Family  Communication: Updated patient's daughter on the phone  Disposition: Status is: Inpatient Remains inpatient appropriate because: Patient not strong enough to go home and currently declining rehab.  Will talk more about rehab tomorrow.  Planned Discharge Destination: Rehab versus home with home health.    Time spent: 28 minutes  Author: Loletha Grayer, MD 08/12/2022 2:44 PM  For on call review [email or web address].

## 2022-08-12 NOTE — Assessment & Plan Note (Signed)
Fall precautions.  

## 2022-08-12 NOTE — Assessment & Plan Note (Signed)
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

## 2022-08-12 NOTE — Progress Notes (Signed)
Occupational Therapy Treatment Patient Details Name: Adam Rios. MRN: [id number] DOB: 1940/10/14 Today's Date: 08/12/2022   History of present illness [name] is a 82 year old male with history of chronic indwelling Foley, dementia, hyperlipidemia, insomnia, chronic heart failure with preserved ejection fraction, history of right lower extremity cellulitis, who presents emergency department for chief concerns of generalized weakness for 2 weeks.     Patient reports he was diagnosed with UTI and prescribed Macrobid last Wednesday from his PCP.  He reports minimal improvement since then.   OT comments  Upon entering the room, pt seated in recliner chair and reports doing much better today. Pt stands from recliner chair with mod lifting assistance and facilitation for anterior weight shift to stand from recliner chair surface. Pt stands and takes several steps with posterior bias and able to stands for ~ 4 minutes. Pt returning to seated position in recliner chair in order to be able to sit up and eat lunch when it arrives. Pt set up for grooming tasks to wash face, comb hair, and brush teeth with increased time to complete tasks and set up A to obtain needed items. Pt remains in recliner chair with call bell and all needed items within reach.    Recommendations for follow up therapy are one component of a multi-disciplinary discharge planning process, led by the attending physician.  Recommendations may be updated based on patient status, additional functional criteria and insurance authorization.    Follow Up Recommendations  Skilled nursing-short term rehab (<3 hours/day)     Assistance Recommended at Discharge Intermittent Supervision/Assistance  Patient can return home with the following  Two people to help with walking and/or transfers;A lot of help with bathing/dressing/bathroom;Assistance with cooking/housework;Assist for transportation;Help with stairs or ramp for entrance    Equipment Recommendations  Other (comment) (defer to next venue of care)       Precautions / Restrictions Precautions Precautions: Fall       Mobility Bed Mobility                    Transfers Overall transfer level: Needs assistance Equipment used: Rolling walker (2 wheels) Transfers: Sit to/from Stand Sit to Stand: Mod assist           General transfer comment: mod lifting assistance to stand from recliner chair with significant assistance for anterior weight shift     Balance Overall balance assessment: Needs assistance Sitting-balance support: Feet supported Sitting balance-Leahy Scale: Good     Standing balance support: Bilateral upper extremity supported, Reliant on assistive device for balance, During functional activity Standing balance-Leahy Scale: Poor                             ADL either performed or assessed with clinical judgement   ADL Overall ADL's : Needs assistance/impaired     Grooming: Wash/dry hands;Wash/dry face;Oral care;Set up;Sitting                                      Extremity/Trunk Assessment Upper Extremity Assessment Upper Extremity Assessment: Generalized weakness   Lower Extremity Assessment Lower Extremity Assessment: Generalized weakness        Vision Patient Visual Report: No change from baseline            Cognition Arousal/Alertness: Awake/alert Behavior During Therapy: Flat affect Overall Cognitive Status: No family/caregiver  present to determine baseline cognitive functioning                                 General Comments: Pt is oriented to self, DOB, location, and situation. Still needing min cuing for safety awareness.                   Pertinent Vitals/ Pain       Pain Assessment Pain Assessment: No/denies pain         Frequency  Min 2X/week        Progress Toward Goals  OT Goals(current goals can now be found in the care plan  section)  Progress towards OT goals: Progressing toward goals     Plan Discharge plan remains appropriate;Frequency remains appropriate       AM-PAC OT "6 Clicks" Daily Activity     Outcome Measure   Help from another person eating meals?: None Help from another person taking care of personal grooming?: A Little Help from another person toileting, which includes using toliet, bedpan, or urinal?: A Lot Help from another person bathing (including washing, rinsing, drying)?: A Lot Help from another person to put on and taking off regular upper body clothing?: A Little Help from another person to put on and taking off regular lower body clothing?: A Lot 6 Click Score: 16    End of Session Equipment Utilized During Treatment: Rolling walker (2 wheels)  OT Visit Diagnosis: Muscle weakness (generalized) (M62.81);Pain   Activity Tolerance Patient tolerated treatment well   Patient Left in chair;with call bell/phone within reach;with chair alarm set   Nurse Communication Mobility status        Time: [id number] OT Time Calculation (min): 21 min  Charges: OT General Charges $OT Visit: 1 Visit OT Treatments $Therapeutic Activity: 8-22 mins  Darleen Crocker, MS, OTR/L , CBIS ascom [phone]  08/12/22, 1:43 PM

## 2022-08-12 NOTE — TOC Progression Note (Signed)
Transition of Care (TOC) - Progression Note    Patient Details  Name: Adam Rios. MRN: [id number] Date of Birth: Jul 01, 1940  Transition of Care [facility]) CM/SW Contact  Beverly Sessions, RN Phone Number: 08/12/2022, 3:53 PM  Clinical Narrative:      Per MD patient A&O and able to make decision about SNF.  Patient in agreement to rehab.  Bed offers presented patient accepts [facility] Accepted in [place].  Notified Tanya with [employer]with [employer] Updated daughter Chipper Oman started in navi portal       Expected Discharge Plan and Services                                               Social Determinants of Health (SDOH) Interventions SDOH Screenings   Food Insecurity: No Food Insecurity (08/09/2022)  Housing: Low Risk  (08/09/2022)  Transportation Needs: No Transportation Needs (08/09/2022)  Utilities: Not At Risk (08/09/2022)  Tobacco Use: Medium Risk (08/09/2022)    Readmission Risk Interventions    08/11/2022    4:53 PM 06/11/2022    2:19 PM 02/12/2022    2:17 PM  Readmission Risk Prevention Plan  Transportation Screening Complete Complete Complete  PCP or Specialist Appt within 3-5 Days  Complete Complete  HRI or Home Care Consult  Complete Complete  Social Work Consult for [place] Planning/Counseling  Complete Complete  Palliative Care Screening  Not Applicable Not Applicable  Medication Review Press photographer) Complete Complete Referral to Pharmacy  [facility] or [place] Complete    Skilled Nursing Facility Complete

## 2022-08-12 NOTE — Progress Notes (Signed)
Physical Therapy Treatment Patient Details Name: Adam Rios. MRN: [id number] DOB: [birth year] Today's Date: 08/12/2022   History of Present Illness Adam Rios is a 82 year old male with history of chronic indwelling Foley, dementia, hyperlipidemia, insomnia, chronic heart failure with preserved ejection fraction, history of right lower extremity cellulitis, who presents emergency department for chief concerns of generalized weakness for 2 weeks.     Patient reports he was diagnosed with UTI and prescribed Macrobid last Wednesday from his PCP.  He reports minimal improvement since then.    PT Comments    Pt showing great motivation to increase activity and was able to do multiple bouts of in room ambulation with heavy walker reliance and close supervision.  Pt progressively able to do more as he was highly motivated to see how far he was from his baseline, walked 12 ft , rest break, 45 ft, rest break, 70 ft.  Pt was fatigued with this effort but vitals stayed appropriate and he ultimately showed much improved activity tolerance than on eval.  Still needing a lot of assist with getting up from the recliner and to initially stabilize/balance, but with heavy UE reliance did well.  Continue with POC.   Recommendations for follow up therapy are one component of a multi-disciplinary discharge planning process, led by the attending physician.  Recommendations may be updated based on patient status, additional functional criteria and insurance authorization.  Follow Up Recommendations  Skilled nursing-short term rehab (<3 hours/day) Can patient physically be transported by private vehicle: No   Assistance Recommended at Discharge Frequent or constant Supervision/Assistance  Patient can return home with the following A little help with walking and/or transfers;A little help with bathing/dressing/bathroom;Assistance with cooking/housework;Assist for transportation;Help with stairs or ramp for  entrance   Equipment Recommendations  None recommended by PT    Recommendations for Other Services       Precautions / Restrictions Precautions Precautions: Fall     Mobility  Bed Mobility               General bed mobility comments: NT, in recliner pre/post session    Transfers Overall transfer level: Needs assistance Equipment used: Rolling walker (2 wheels) Transfers: Sit to/from Stand Sit to Stand: Mod assist           General transfer comment: heavy mod assist to get to standing from standard height recliner X 4 t/o session.  Pt endorses having lift chair at home not needing to be able to rise from a chair like this - showed great effort in attempts at trying to standing with less/min assist    Ambulation/Gait Ambulation/Gait assistance: Min guard Gait Distance (Feet): 70 Feet Assistive device: Rolling walker (2 wheels)         General Gait Details: 3 bouts of ambulation with great effort each time, pt motivated to see what he can do and try to get home.  VSS with each attempt.  first ~12 ft with slow and steady gait/chair follow.  74 ft on second effort and in an effort to really push himself he as able to do multiple loops to/from the door for ~70 ft - heavy reliance on the walker and slow cadence, but no overt LOBs or safety issues   Stairs             Wheelchair Mobility    Modified Rankin (Stroke Patients Only)       Balance Overall balance assessment: Needs assistance Sitting-balance support: Feet supported Sitting balance-Leahy  Scale: Good Sitting balance - Comments: able to maintain sitting balance EOB w/o direct assist but needing close supervision   Standing balance support: Bilateral upper extremity supported, Reliant on assistive device for balance, During functional activity Standing balance-Leahy Scale: Fair Standing balance comment: Pt highly reliant on UEs/AD, much improved standing tolerance - no LOBs                             Cognition Arousal/Alertness: Awake/alert Behavior During Therapy:  (tearful about rehab) Overall Cognitive Status: No family/caregiver present to determine baseline cognitive functioning                                          Exercises      General Comments General comments (skin integrity, edema, etc.): Pt tearful about being at rehab (especially for his birthday) and showed great effort and motivation to see how much he can really do      Pertinent Vitals/Pain Pain Assessment Pain Assessment: No/denies pain    Home Living                          Prior Function            PT Goals (current goals can now be found in the care plan section) Progress towards PT goals: Progressing toward goals    Frequency    Min 2X/week      PT Plan Current plan remains appropriate    Co-evaluation              AM-PAC PT "6 Clicks" Mobility   Outcome Measure  Help needed turning from your back to your side while in a flat bed without using bedrails?: A Little Help needed moving from lying on your back to sitting on the side of a flat bed without using bedrails?: A Little Help needed moving to and from a bed to a chair (including a wheelchair)?: A Lot Help needed standing up from a chair using your arms (e.g., wheelchair or bedside chair)?: A Lot Help needed to walk in hospital room?: A Little Help needed climbing 3-5 steps with a railing? : A Lot 6 Click Score: 15    End of Session Equipment Utilized During Treatment: Gait belt Activity Tolerance: Patient limited by fatigue Patient left: with chair alarm set;with call bell/phone within reach Nurse Communication: Mobility status PT Visit Diagnosis: Muscle weakness (generalized) (M62.81);Difficulty in walking, not elsewhere classified (R26.2);Unsteadiness on feet (R26.81)     Time: [id number] PT Time Calculation (min) (ACUTE ONLY): 28 min  Charges:  $Gait Training: 8-22  mins $Therapeutic Activity: 8-22 mins                     Kreg Shropshire, DPT 08/12/2022, 6:04 PM

## 2022-08-12 NOTE — Assessment & Plan Note (Signed)
-   Atorvastatin 40 mg daily resumed 

## 2022-08-12 NOTE — Progress Notes (Addendum)
Mobility Specialist - Progress Note   08/12/22 1100  Mobility  Activity Ambulated with assistance in room;Transferred from bed to chair  Level of Assistance Moderate assist, patient does 50-74%  Assistive Device Front wheel walker  Distance Ambulated (ft) 15 ft  Activity Response Tolerated fair  $Mobility charge 1 Mobility     Pt lying in bed upon arrival, utilizing RA. Pt very pleasant and motivated for activity. Completed bed mobility with modA. STS with maxA x3 (standard bed height x1, chair x2). Ambulated in room (5' + 10') with modA fading to [place]. Extra time to come into standing. VC for maintaining hand placement as pt reaches out to furniture for increased support despite +2 assist available. Increased effort to advance LE for gait training. Heavy reliance on RW. Decreased activity tolerance. RPE 9/10. Pt left in chair with alarm set, needs in reach. RN notified.    Kathee Delton Mobility Specialist 08/12/22, 11:15 AM

## 2022-08-12 NOTE — Assessment & Plan Note (Addendum)
Continue MiraLAX as needed 

## 2022-08-12 NOTE — Assessment & Plan Note (Addendum)
Continue Toprol.  We held diuretic throughout the hospital course until today where I gave 1 dose of IV Lasix.  Can go back on Demadex but lower dose 20 mg twice a day upon going home.  Will also prescribed potassium.  Hold spironolactone.  Weight upon discharge 98.1 kg.

## 2022-08-13 DIAGNOSIS — K5900 Constipation, unspecified: Secondary | ICD-10-CM | POA: Diagnosis not present

## 2022-08-13 DIAGNOSIS — K219 Gastro-esophageal reflux disease without esophagitis: Secondary | ICD-10-CM

## 2022-08-13 DIAGNOSIS — N401 Enlarged prostate with lower urinary tract symptoms: Secondary | ICD-10-CM | POA: Diagnosis not present

## 2022-08-13 DIAGNOSIS — N390 Urinary tract infection, site not specified: Secondary | ICD-10-CM | POA: Diagnosis not present

## 2022-08-13 DIAGNOSIS — A419 Sepsis, unspecified organism: Secondary | ICD-10-CM | POA: Diagnosis not present

## 2022-08-13 MED ORDER — CEPHALEXIN 500 MG PO CAPS
500.0000 mg | ORAL_CAPSULE | Freq: Two times a day (BID) | ORAL | Status: DC
  Administered 2022-08-13 – 2022-08-15 (×5): 500 mg via ORAL
  Filled 2022-08-13 (×5): qty 1

## 2022-08-13 MED ORDER — FLEET ENEMA 7-19 GM/118ML RE ENEM
1.0000 | ENEMA | Freq: Once | RECTAL | Status: AC
  Administered 2022-08-13: 1 via RECTAL

## 2022-08-13 MED ORDER — POLYETHYLENE GLYCOL 3350 17 G PO PACK
17.0000 g | PACK | Freq: Two times a day (BID) | ORAL | Status: DC
  Administered 2022-08-13 – 2022-08-14 (×2): 17 g via ORAL
  Filled 2022-08-13 (×4): qty 1

## 2022-08-13 MED ORDER — POLYETHYLENE GLYCOL 3350 17 G PO PACK
17.0000 g | PACK | Freq: Every day | ORAL | Status: DC
  Administered 2022-08-13: 17 g via ORAL
  Filled 2022-08-13: qty 1
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
## 2022-08-13 NOTE — Assessment & Plan Note (Signed)
Patient with chronic Foley catheter which was changed in ED on  08/09/2022.

## 2022-08-13 NOTE — Progress Notes (Signed)
Mobility Specialist - Progress Note   08/13/22 1145  Mobility  Activity Stood at bedside;Transferred from bed to chair;Ambulated with assistance in room  Level of Assistance Standby assist, set-up cues, supervision of patient - no hands on  Assistive Device Front wheel walker  Distance Ambulated (ft) 7 ft  Range of Motion/Exercises Active  Activity Response Tolerated fair  $Mobility charge 1 Mobility     Pt sleeping in bed upon arrival, utilizing RA. Pt awakened to voice and agreeable to activity. Pt completed bed mobility with modA for trunk support. Mild R lean in sitting corrected with cueing. Pt ambulated a few small feet minG before requesting to sit d/t feeling "worn out". Pt STS x7 during session (6x from recliner) with mod-maxA as pt fatigues and requires more assistance. Increased anterior and lateral lean in standing with fatigue. Pt constantly reaching out for bed railing to stand and profusely refusing to utilize RW during session despite education and max cueing; states he does not feel confidently supported by RW. Pt adamant about returning home despite heavy assistance this date. Pt left in chair with alarm set, needs in reach. RN notified.     Kathee Delton Mobility Specialist 08/13/22, 11:51 AM

## 2022-08-13 NOTE — TOC Progression Note (Addendum)
Transition of Care (TOC) - Progression Note    Patient Details  Name: Adam Rios. MRN: [id number] Date of Birth: Nov 28, 1940  Transition of Care [facility]) CM/SW Contact  Beverly Sessions, RN Phone Number: 08/13/2022, 12:00 PM  Clinical Narrative:     Patient is now emotional going about going to rehab stating he is forcing him self to go, but wants to return home Daughter [name] states he can not return home unless he is able to walker 30 feet, and will be home alone during the day  TOC does have insurance auth for [facility] Patient is now wanting to go home with home health, patient is already active with [facility]       Expected Discharge Plan and Services                                               Social Determinants of Health (SDOH) Interventions SDOH Screenings   Food Insecurity: No Food Insecurity (08/09/2022)  Housing: Low Risk  (08/09/2022)  Transportation Needs: No Transportation Needs (08/09/2022)  Utilities: Not At Risk (08/09/2022)  Tobacco Use: Medium Risk (08/09/2022)    Readmission Risk Interventions    08/11/2022    4:53 PM 06/11/2022    2:19 PM 02/12/2022    2:17 PM  Readmission Risk Prevention Plan  Transportation Screening Complete Complete Complete  PCP or Specialist Appt within 3-5 Days  Complete Complete  HRI or Home Care Consult  Complete Complete  Social Work Consult for [place] Planning/Counseling  Complete Complete  Palliative Care Screening  Not Applicable Not Applicable  Medication Review [employer]) Complete Complete Referral to Pharmacy  [facility] or [place] Complete

## 2022-08-14 DIAGNOSIS — R531 Weakness: Secondary | ICD-10-CM | POA: Diagnosis not present

## 2022-08-14 DIAGNOSIS — K5900 Constipation, unspecified: Secondary | ICD-10-CM | POA: Diagnosis not present

## 2022-08-14 DIAGNOSIS — E876 Hypokalemia: Secondary | ICD-10-CM | POA: Insufficient documentation

## 2022-08-14 DIAGNOSIS — N390 Urinary tract infection, site not specified: Secondary | ICD-10-CM | POA: Diagnosis not present

## 2022-08-14 DIAGNOSIS — A419 Sepsis, unspecified organism: Secondary | ICD-10-CM | POA: Diagnosis not present

## 2022-08-14 LAB — CULTURE, BLOOD (ROUTINE X 2)
Culture: NO GROWTH
Culture: NO GROWTH
Special Requests: ADEQUATE
Special Requests: ADEQUATE

## 2022-08-14 LAB — CBC
HCT: 38.3 % — ABNORMAL LOW (ref 39.0–52.0)
Hemoglobin: 12.3 g/dL — ABNORMAL LOW (ref 13.0–17.0)
MCH: 26.8 pg (ref 26.0–34.0)
MCHC: 32.1 g/dL (ref 30.0–36.0)
MCV: 83.4 fL (ref 80.0–100.0)
Platelets: 219 10*3/uL (ref 150–400)
RBC: 4.59 MIL/uL (ref 4.22–5.81)
RDW: 16.5 % — ABNORMAL HIGH (ref 11.5–15.5)
WBC: 10.9 10*3/uL — ABNORMAL HIGH (ref 4.0–10.5)
nRBC: 0 % (ref 0.0–0.2)

## 2022-08-14 LAB — BASIC METABOLIC PANEL
Anion gap: 15 (ref 5–15)
BUN: 25 mg/dL — ABNORMAL HIGH (ref 8–23)
CO2: 22 mmol/L (ref 22–32)
Calcium: 9.1 mg/dL (ref 8.9–10.3)
Chloride: 96 mmol/L — ABNORMAL LOW (ref 98–111)
Creatinine, Ser: 1.17 mg/dL (ref 0.61–1.24)
GFR, Estimated: >60 mL/min (ref >60)
Glucose, Bld: 184 mg/dL — ABNORMAL HIGH (ref 70–99)
Potassium: 3.3 mmol/L — ABNORMAL LOW (ref 3.5–5.1)
Sodium: 133 mmol/L — ABNORMAL LOW (ref 135–145)

## 2022-08-14 MED ORDER — POTASSIUM CHLORIDE CRYS ER 20 MEQ PO TBCR
40.0000 meq | EXTENDED_RELEASE_TABLET | Freq: Once | ORAL | Status: AC
  Administered 2022-08-14: 40 meq via ORAL
  Filled 2022-08-14: qty 2
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
## 2022-08-14 NOTE — Progress Notes (Signed)
Pt continues to be adamant that he wants to d/c home instead of short term stay at SNF. Pt does have difficulty with sit to stand transfers, however he does use a lift chair at home, may need to assess if he can get up from a BSC. Gait is improving with encouragement, he remains at a high fall risk and only tolerated a total of 21ft prior to fatiguing while using a RW and CGA. Continue to recommend SNF placement at d/c unless there is adequate assist at home.    08/13/22 1500  PT Visit Information  Assistance Needed +1  History of Present Illness Adam Rios is a 82 year old male with history of chronic indwelling Foley, dementia, hyperlipidemia, insomnia, chronic heart failure with preserved ejection fraction, history of right lower extremity cellulitis, who presents emergency department for chief concerns of generalized weakness for 2 weeks.     Patient reports he was diagnosed with UTI and prescribed Macrobid last Wednesday from his PCP.  He reports minimal improvement since then.  Subjective Data  Subjective "I don't want to go to rehab, I can't be there for my birthday"  Patient Stated Goal get catheter figured out  Precautions  Precautions Fall  Restrictions  Weight Bearing Restrictions No  Pain Assessment  Pain Assessment No/denies pain  Cognition  Arousal/Alertness Awake/alert  Behavior During Therapy Flat affect  Overall Cognitive Status No family/caregiver present to determine baseline cognitive functioning  General Comments Pt is oriented to self, DOB, location, and situation. Still needing min cuing for safety awareness.  Bed Mobility  General bed mobility comments NT, in recliner pre/post session  Transfers  Overall transfer level Needs assistance  Equipment used Rolling walker (2 wheels)  Transfers Sit to/from Stand  Sit to Stand Mod assist  General transfer comment heavy mod assist to get to standing from standard height recliner X 4 t/o session.  Pt endorses having  lift chair at home not needing to be able to rise from a chair like this - showed great effort in attempts at trying to standing with less/min assist  Ambulation/Gait  Ambulation/Gait assistance Min guard  Gait Distance (Feet) 20 Feet  Assistive device Rolling walker (2 wheels)  Gait Pattern/deviations Step-to pattern;Trunk flexed;Wide base of support;Decreased step length - right;Decreased step length - left  General Gait Details remains at a high risk for falls due to poor posture and foot clearance during gait  Balance  Overall balance assessment Needs assistance  Sitting-balance support Feet supported  Sitting balance-Leahy Scale Good  Sitting balance - Comments able to maintain sitting balance EOB w/o direct assist but needing close supervision  Standing balance support Bilateral upper extremity supported;Reliant on assistive device for balance;During functional activity  Standing balance-Leahy Scale Fair  Standing balance comment Pt highly reliant on UEs/AD, much improved standing tolerance - no LOBs  General Comments  General comments (skin integrity, edema, etc.) Pt incintinent of loose stool sitting in chair unknowingly  PT - End of Session  Equipment Utilized During Treatment Gait belt  Activity Tolerance Patient limited by fatigue  Patient left with chair alarm set;with call bell/phone within reach  Nurse Communication Mobility status   PT - Assessment/Plan  PT Plan Current plan remains appropriate  PT Visit Diagnosis Muscle weakness (generalized) (M62.81);Difficulty in walking, not elsewhere classified (R26.2);Unsteadiness on feet (R26.81)  PT Frequency (ACUTE ONLY) Min 2X/week  Follow Up Recommendations Skilled nursing-short term rehab (<3 hours/day)  Can patient physically be transported by private vehicle No  Assistance recommended  at discharge Frequent or constant Supervision/Assistance  Patient can return home with the following A little help with walking and/or  transfers;A little help with bathing/dressing/bathroom;Assistance with cooking/housework;Assist for transportation;Help with stairs or ramp for entrance  PT equipment None recommended by PT  AM-PAC PT "6 Clicks" Mobility Outcome Measure (Version 2)  Help needed turning from your back to your side while in a flat bed without using bedrails? 3  Help needed moving from lying on your back to sitting on the side of a flat bed without using bedrails? 3  Help needed moving to and from a bed to a chair (including a wheelchair)? 2  Help needed standing up from a chair using your arms (e.g., wheelchair or bedside chair)? 2  Help needed to walk in hospital room? 3  Help needed climbing 3-5 steps with a railing?  2  6 Click Score 15  Consider Recommendation of Discharge To: CIR/SNF/LTACH  Progressive Mobility  What is the highest level of mobility based on the progressive mobility assessment? Level 5 (Walks with assist in room/hall) - Balance while stepping forward/back and can walk in room with assist - Complete  Activity Ambulated with assistance in hallway  PT Time Calculation  PT Start Time (ACUTE ONLY) 1359  PT Stop Time (ACUTE ONLY) 1430  PT Time Calculation (min) (ACUTE ONLY) 31 min  PT General Charges  $$ ACUTE PT VISIT 1 Visit  PT Treatments  $Gait Training 8-22 mins  $Therapeutic Activity 8-22 mins   Adam Rios, PTA 08/12/2021 15:00

## 2022-08-14 NOTE — Assessment & Plan Note (Signed)
Replace potassium with restarting Demadex.

## 2022-08-14 NOTE — Progress Notes (Signed)
Mobility Specialist - Progress Note   08/14/22 1100  Mobility  Activity Ambulated with assistance in hallway  Level of Assistance Standby assist, set-up cues, supervision of patient - no hands on  Assistive Device Front wheel walker  Distance Ambulated (ft) 40 ft  Activity Response Tolerated well  $Mobility charge 1 Mobility    During mobility: 108 HR, 98% SpO2   Pt sitting in recliner upon arrival, utilizing RA. Pt agreeable to activity. Completed 5 STS with modA progressing to minA and extra time. Ambulated in hallway 15' + 25' with minG. No LOB. Anterior lean. VC for increasing step length with good follow-through. Pt left in chair with OT entering upon exit.    Kathee Delton Mobility Specialist 08/14/22, 11:05 AM

## 2022-08-14 NOTE — Progress Notes (Signed)
Occupational Therapy Treatment Patient Details Name: Adam Rios. MRN: [id number] DOB: 1941-05-23 Today's Date: 08/14/2022   History of present illness Adam Rios is a 82 year old male with history of chronic indwelling Foley, dementia, hyperlipidemia, insomnia, chronic heart failure with preserved ejection fraction, history of right lower extremity cellulitis, who presents emergency department for chief concerns of generalized weakness for 2 weeks.     Patient reports he was diagnosed with UTI and prescribed Macrobid last Wednesday from his PCP.  He reports minimal improvement since then.   OT comments  Upon entering the room, pt seated in recliner chair and having just finished working with mobility specialist. Pt is pleasant and wanting to continue to work toward his goal of returning home. Pt continues to need mod lifting assistance to stand from recliner chair with RW. Pt performs step pivot to [facility] with RW and min A for balance. BSC is elevated to increase ease of transfer. Pt needing to take seated rest break and likely pt would need assistance with hygiene. Pt needing assistance to pull underwear over B hips while standing with min A for balance. Pt stands from elevated BSC with min A and returns to recliner chair. Pt really wants to return home but has limited assistance and at this time he still requires physical assistance. OT continues to recommend SNF. Pt continues to benefit from OT intervention.    Recommendations for follow up therapy are one component of a multi-disciplinary discharge planning process, led by the attending physician.  Recommendations may be updated based on patient status, additional functional criteria and insurance authorization.    Follow Up Recommendations  Skilled nursing-short term rehab (<3 hours/day)     Assistance Recommended at Discharge Intermittent Supervision/Assistance  Patient can return home with the following  Assistance with  cooking/housework;Assist for transportation;Help with stairs or ramp for entrance;A lot of help with walking and/or transfers;A lot of help with bathing/dressing/bathroom   Equipment Recommendations  Other (comment) (defer to next venue of care)       Precautions / Restrictions Precautions Precautions: Fall Restrictions Weight Bearing Restrictions: No       Mobility Bed Mobility               General bed mobility comments: NT, in recliner pre/post session    Transfers Overall transfer level: Needs assistance Equipment used: Rolling walker (2 wheels) Transfers: Sit to/from Stand, Bed to chair/wheelchair/BSC Sit to Stand: Mod assist     Step pivot transfers: Min assist           Balance Overall balance assessment: Needs assistance Sitting-balance support: Feet supported Sitting balance-Leahy Scale: Good     Standing balance support: Bilateral upper extremity supported, Reliant on assistive device for balance, During functional activity Standing balance-Leahy Scale: Fair Standing balance comment: Pt highly reliant on UEs/AD, much improved standing tolerance - no LOBs                           ADL either performed or assessed with clinical judgement   ADL Overall ADL's : Needs assistance/impaired                         Toilet Transfer: Minimal assistance;BSC/3in1;Rolling walker (2 wheels) Toilet Transfer Details (indicate cue type and reason): elevated BSC                Extremity/Trunk Assessment Upper Extremity Assessment Upper Extremity Assessment: Generalized weakness  Lower Extremity Assessment Lower Extremity Assessment: Generalized weakness        Vision Patient Visual Report: No change from baseline            Cognition Arousal/Alertness: Awake/alert Behavior During Therapy: Flat affect Overall Cognitive Status: No family/caregiver present to determine baseline cognitive functioning                                  General Comments: Pt calls this therapist by name from last session and is pleasant and interactive during session. Pt still needing min cuing for safety awareness.                   Pertinent Vitals/ Pain       Pain Assessment Pain Assessment: No/denies pain         Frequency  Min 2X/week        Progress Toward Goals  OT Goals(current goals can now be found in the care plan section)  Progress towards OT goals: Progressing toward goals     Plan Discharge plan remains appropriate;Frequency remains appropriate       AM-PAC OT "6 Clicks" Daily Activity     Outcome Measure   Help from another person eating meals?: None Help from another person taking care of personal grooming?: A Little Help from another person toileting, which includes using toliet, bedpan, or urinal?: A Lot Help from another person bathing (including washing, rinsing, drying)?: A Lot Help from another person to put on and taking off regular upper body clothing?: A Little Help from another person to put on and taking off regular lower body clothing?: A Lot 6 Click Score: 16    End of Session Equipment Utilized During Treatment: Rolling walker (2 wheels)  OT Visit Diagnosis: Muscle weakness (generalized) (M62.81)   Activity Tolerance Patient tolerated treatment well   Patient Left in chair;with call bell/phone within reach;with chair alarm set   Nurse Communication Mobility status        Time: [id number] OT Time Calculation (min): 26 min  Charges: OT General Charges $OT Visit: 1 Visit OT Treatments $Self Care/Home Management : 23-37 mins  Adam Crocker, MS, OTR/L , CBIS ascom [phone]  08/14/22, 12:37 PM

## 2022-08-14 NOTE — Plan of Care (Signed)
  Problem: Activity: Goal: Risk for activity intolerance will decrease Outcome: Progressing   Problem: Nutrition: Goal: Adequate nutrition will be maintained Outcome: Progressing   Problem: Elimination: Goal: Will not experience complications related to bowel motility Outcome: Progressing   Problem: Safety: Goal: Ability to remain free from injury will improve Outcome: Progressing   

## 2022-08-14 NOTE — TOC Progression Note (Addendum)
Transition of Care (TOC) - Progression Note    Patient Details  Name: Nijah Murfin. MRN: [id number] Date of Birth: [birth year]  Transition of Care [facility]) CM/SW Contact  Ross Ludwig, [place] Phone Number: 08/14/2022, 4:30pm  Clinical Narrative:     CSW spoke with patient and he is still refusing SNF placement.  CSW informed him his insurance has approved him to go to SNF if he wants.  Per patient he would like to return back home.  Patient states he was receiving home health services through [facility] and would like to continue.  Patient has a bedside commode and a walker at home, and states he does not need any other DME.  Patient states his daughter can transport him home, if daughter can not transport, he is okay with EMS transport home.  CSW updated [facility] that patient is thinking about going home with home health instead of SNF.  CSW explained benefits of going to SNF verse  home health, patient still feels he would not benefit from SNF placement.  Patient is alert and oriented x4 and has the right to make decisions not to go to SNF.     Expected Discharge Plan and Services                                               Social Determinants of Health (SDOH) Interventions SDOH Screenings   Food Insecurity: No Food Insecurity (08/09/2022)  Housing: Low Risk  (08/09/2022)  Transportation Needs: No Transportation Needs (08/09/2022)  Utilities: Not At Risk (08/09/2022)  Tobacco Use: Medium Risk (08/09/2022)    Readmission Risk Interventions    08/11/2022    4:53 PM 06/11/2022    2:19 PM 02/12/2022    2:17 PM  Readmission Risk Prevention Plan  Transportation Screening Complete Complete Complete  PCP or Specialist Appt within 3-5 Days  Complete Complete  HRI or Home Care Consult  Complete Complete  Social Work Consult for [place] Planning/Counseling  Complete Complete  Palliative Care Screening  Not Applicable Not Applicable  Medication Review Designer, fashion/clothing) Complete Complete Referral to Pharmacy  [facility] or [facility] Complete    Skilled Nursing Facility Complete

## 2022-08-15 DIAGNOSIS — Z978 Presence of other specified devices: Secondary | ICD-10-CM

## 2022-08-15 DIAGNOSIS — R531 Weakness: Secondary | ICD-10-CM | POA: Diagnosis not present

## 2022-08-15 DIAGNOSIS — C642 Malignant neoplasm of left kidney, except renal pelvis: Secondary | ICD-10-CM

## 2022-08-15 DIAGNOSIS — E785 Hyperlipidemia, unspecified: Secondary | ICD-10-CM

## 2022-08-15 DIAGNOSIS — A419 Sepsis, unspecified organism: Secondary | ICD-10-CM | POA: Diagnosis not present

## 2022-08-15 DIAGNOSIS — K5900 Constipation, unspecified: Secondary | ICD-10-CM | POA: Diagnosis not present

## 2022-08-15 DIAGNOSIS — N390 Urinary tract infection, site not specified: Secondary | ICD-10-CM | POA: Diagnosis not present

## 2022-08-15 MED ORDER — FUROSEMIDE 10 MG/ML IJ SOLN
40.0000 mg | Freq: Once | INTRAMUSCULAR | Status: AC
  Administered 2022-08-15: 40 mg via INTRAVENOUS
  Filled 2022-08-15: qty 4

## 2022-08-15 MED ORDER — TORSEMIDE 20 MG PO TABS: 20.0000 mg | ORAL_TABLET | Freq: Two times a day (BID) | ORAL | 0 refills | Status: DC

## 2022-08-15 MED ORDER — ACETAMINOPHEN 325 MG PO TABS
650.0000 mg | ORAL_TABLET | Freq: Four times a day (QID) | ORAL | Status: DC | PRN
  Administered 2022-08-15: 650 mg via ORAL
  Filled 2022-08-15: qty 2

## 2022-08-15 MED ORDER — POTASSIUM CHLORIDE CRYS ER 20 MEQ PO TBCR: 20.0000 meq | EXTENDED_RELEASE_TABLET | Freq: Every day | ORAL | 0 refills | Status: DC

## 2022-08-15 MED ORDER — SENNOSIDES-DOCUSATE SODIUM 8.6-50 MG PO TABS: 1.0000 | ORAL_TABLET | Freq: Two times a day (BID) | ORAL | Status: DC | PRN

## 2022-08-15 MED ORDER — POLYETHYLENE GLYCOL 3350 17 G PO PACK: 17.0000 g | PACK | Freq: Every day | ORAL | 0 refills | Status: DC | PRN

## 2022-08-15 NOTE — Progress Notes (Signed)
EMS here to transport patient home.  Discharge instructions and med details reviewed with Pt's daughter [name]. All questions answered. AVS in discharge package. Foley bag emptied. IV removed  Phone call given to [name] to let her know patient has been picked up by EMS.   Wynema Birch, RN

## 2022-08-15 NOTE — Progress Notes (Addendum)
Mobility Specialist - Progress Note   08/15/22 1000  Mobility  Activity Ambulated with assistance in hallway;Transferred from bed to chair  Level of Assistance Standby assist, set-up cues, supervision of patient - no hands on  Assistive Device Front wheel walker  Distance Ambulated (ft) 85 ft  Activity Response Tolerated well  $Mobility charge 1 Mobility     Pt lying in bed upon arrival, utilizing RA. Pt very motivated for session in hopes of d/c home. Completed bed mobility with modA for trunk support. STS x4 during session from both bed and chair with minA + extra time but does require modA as pt begins to fatigue. Ambulated in hallway with minG (20' + 30' + 35') and chair follow. Forward lean with RW. Improved foot clearance during ambulation this date with increased step length and speed. Pt denied fatigue throughout activity; noted to be mildly SOB at the end of session. No complaints. Pt left in recliner with alarm set, needs in reach.    Kathee Delton Mobility Specialist 08/15/22, 11:03 AM
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
## 2022-08-15 NOTE — TOC Progression Note (Signed)
Transition of Care (TOC) - Progression Note    Patient Details  Name: Adam Rios. MRN: [id number] Date of Birth: Nov 13, 1940  Transition of Care [facility]) CM/SW Contact  Carol Ada, RN Phone Number: 08/15/2022, 11:53 AM  Clinical Narrative:    I have spoken with Dr. Leslye Peer.  He informs me that patient has refused SNF and is agreeable to going home with [facility] services and will possibly need ambulance transport today.    I have spoken with patient's daughter [name] and informed her that patient will be a discharge for today.  I have informed her that patient will be coming home with [facility] services via [facility]. I have made [name] aware that patient will have [facility] RN, PT, OT and in-home.  PT has recommended that patient is not able to transport in a vehicle. I have informed [name] that I will arrange EMS transport for Adam Rios to come home today.    I have spoken with Corene Cornea with [facility] and informed him that patient will be a discharge for today.  I have informed Corene Cornea of [place] orders.   I have also informed staff nurse of the above information. I will arrange EMS for transport home.           Expected Discharge Plan and Services         Expected Discharge Date: 08/15/22                                     Social Determinants of Health (SDOH) Interventions SDOH Screenings   Food Insecurity: No Food Insecurity (08/09/2022)  Housing: Low Risk  (08/09/2022)  Transportation Needs: No Transportation Needs (08/09/2022)  Utilities: Not At Risk (08/09/2022)  Tobacco Use: Medium Risk (08/09/2022)    Readmission Risk Interventions    08/11/2022    4:53 PM 06/11/2022    2:19 PM 02/12/2022    2:17 PM  Readmission Risk Prevention Plan  Transportation Screening Complete Complete Complete  PCP or Specialist Appt within 3-5 Days  Complete Complete  HRI or Home Care Consult  Complete Complete  Social Work Consult for [place] Planning/Counseling  Complete Complete  Palliative  Care Screening  Not Applicable Not Applicable  Medication Review [employer]) Complete Complete Referral to Pharmacy  [facility] or [facility] [place] Complete    Skilled Nursing Facility Complete

## 2022-08-23 ENCOUNTER — Emergency Department: Payer: Medicare Other

## 2022-08-23 ENCOUNTER — Inpatient Hospital Stay
Admission: EM | Admit: 2022-08-23 | Discharge: 2022-08-28 | DRG: 641 | Disposition: A | Discharge status: Skilled Nursing Facility | Payer: Medicare Other | Attending: Obstetrics and Gynecology | Admitting: Obstetrics and Gynecology

## 2022-08-23 DIAGNOSIS — Z1152 Encounter for screening for COVID-19: Secondary | ICD-10-CM | POA: Diagnosis not present

## 2022-08-23 DIAGNOSIS — T83031A Leakage of indwelling urethral catheter, initial encounter: Secondary | ICD-10-CM | POA: Diagnosis present

## 2022-08-23 DIAGNOSIS — E876 Hypokalemia: Secondary | ICD-10-CM | POA: Diagnosis present

## 2022-08-23 DIAGNOSIS — Z87440 Personal history of urinary (tract) infections: Secondary | ICD-10-CM | POA: Diagnosis not present

## 2022-08-23 DIAGNOSIS — I459 Conduction disorder, unspecified: Secondary | ICD-10-CM | POA: Diagnosis present

## 2022-08-23 DIAGNOSIS — Z8673 Personal history of transient ischemic attack (TIA), and cerebral infarction without residual deficits: Secondary | ICD-10-CM | POA: Diagnosis not present

## 2022-08-23 DIAGNOSIS — F0390 Unspecified dementia without behavioral disturbance: Secondary | ICD-10-CM | POA: Diagnosis present

## 2022-08-23 DIAGNOSIS — I110 Hypertensive heart disease with heart failure: Secondary | ICD-10-CM | POA: Diagnosis present

## 2022-08-23 DIAGNOSIS — Z87891 Personal history of nicotine dependence: Secondary | ICD-10-CM

## 2022-08-23 DIAGNOSIS — R627 Adult failure to thrive: Secondary | ICD-10-CM | POA: Diagnosis present

## 2022-08-23 DIAGNOSIS — T501X5A Adverse effect of loop [high-ceiling] diuretics, initial encounter: Secondary | ICD-10-CM | POA: Diagnosis present

## 2022-08-23 DIAGNOSIS — N179 Acute kidney failure, unspecified: Secondary | ICD-10-CM

## 2022-08-23 DIAGNOSIS — E860 Dehydration: Secondary | ICD-10-CM | POA: Diagnosis present

## 2022-08-23 DIAGNOSIS — K219 Gastro-esophageal reflux disease without esophagitis: Secondary | ICD-10-CM | POA: Diagnosis present

## 2022-08-23 DIAGNOSIS — Z950 Presence of cardiac pacemaker: Secondary | ICD-10-CM

## 2022-08-23 DIAGNOSIS — R531 Weakness: Secondary | ICD-10-CM | POA: Diagnosis not present

## 2022-08-23 DIAGNOSIS — Z85528 Personal history of other malignant neoplasm of kidney: Secondary | ICD-10-CM

## 2022-08-23 DIAGNOSIS — N401 Enlarged prostate with lower urinary tract symptoms: Secondary | ICD-10-CM | POA: Diagnosis present

## 2022-08-23 DIAGNOSIS — Y738 Miscellaneous gastroenterology and urology devices associated with adverse incidents, not elsewhere classified: Secondary | ICD-10-CM | POA: Diagnosis present

## 2022-08-23 DIAGNOSIS — R55 Syncope and collapse: Secondary | ICD-10-CM | POA: Diagnosis present

## 2022-08-23 DIAGNOSIS — Z905 Acquired absence of kidney: Secondary | ICD-10-CM | POA: Diagnosis not present

## 2022-08-23 DIAGNOSIS — E871 Hypo-osmolality and hyponatremia: Secondary | ICD-10-CM | POA: Diagnosis present

## 2022-08-23 DIAGNOSIS — Z8619 Personal history of other infectious and parasitic diseases: Secondary | ICD-10-CM | POA: Diagnosis present

## 2022-08-23 DIAGNOSIS — I5030 Unspecified diastolic (congestive) heart failure: Secondary | ICD-10-CM | POA: Diagnosis present

## 2022-08-23 DIAGNOSIS — I5032 Chronic diastolic (congestive) heart failure: Secondary | ICD-10-CM | POA: Diagnosis present

## 2022-08-23 DIAGNOSIS — N138 Other obstructive and reflux uropathy: Secondary | ICD-10-CM | POA: Diagnosis present

## 2022-08-23 DIAGNOSIS — N319 Neuromuscular dysfunction of bladder, unspecified: Secondary | ICD-10-CM | POA: Diagnosis present

## 2022-08-23 DIAGNOSIS — N390 Urinary tract infection, site not specified: Secondary | ICD-10-CM

## 2022-08-23 DIAGNOSIS — E872 Acidosis, unspecified: Secondary | ICD-10-CM | POA: Diagnosis present

## 2022-08-23 DIAGNOSIS — E785 Hyperlipidemia, unspecified: Secondary | ICD-10-CM | POA: Diagnosis present

## 2022-08-23 DIAGNOSIS — I10 Essential (primary) hypertension: Secondary | ICD-10-CM | POA: Diagnosis present

## 2022-08-23 DIAGNOSIS — R410 Disorientation, unspecified: Principal | ICD-10-CM

## 2022-08-23 LAB — CBC WITH DIFFERENTIAL/PLATELET
Abs Immature Granulocytes: 0.04 10*3/uL (ref 0.00–0.07)
Basophils Absolute: 0 10*3/uL (ref 0.0–0.1)
Basophils Relative: 0 %
Eosinophils Absolute: 0 10*3/uL (ref 0.0–0.5)
Eosinophils Relative: 0 %
HCT: 40.2 % (ref 39.0–52.0)
Hemoglobin: 12.8 g/dL — ABNORMAL LOW (ref 13.0–17.0)
Immature Granulocytes: 0 %
Lymphocytes Relative: 19 %
Lymphs Abs: 1.8 10*3/uL (ref 0.7–4.0)
MCH: 26.2 pg (ref 26.0–34.0)
MCHC: 31.8 g/dL (ref 30.0–36.0)
MCV: 82.4 fL (ref 80.0–100.0)
Monocytes Absolute: 0.9 10*3/uL (ref 0.1–1.0)
Monocytes Relative: 9 %
Neutro Abs: 6.9 10*3/uL (ref 1.7–7.7)
Neutrophils Relative %: 72 %
Platelets: 209 10*3/uL (ref 150–400)
RBC: 4.88 MIL/uL (ref 4.22–5.81)
RDW: 16.3 % — ABNORMAL HIGH (ref 11.5–15.5)
WBC: 9.6 10*3/uL (ref 4.0–10.5)
nRBC: 0 % (ref 0.0–0.2)

## 2022-08-23 LAB — URINALYSIS, COMPLETE (UACMP) WITH MICROSCOPIC
Bilirubin Urine: NEGATIVE
Glucose, UA: NEGATIVE mg/dL
Ketones, ur: NEGATIVE mg/dL
Nitrite: NEGATIVE
Protein, ur: 100 mg/dL — AB
Specific Gravity, Urine: 1.015 (ref 1.005–1.030)
Squamous Epithelial / HPF: NONE SEEN /HPF (ref 0–5)
WBC, UA: >50 WBC/hpf (ref 0–5)
pH: 5 (ref 5.0–8.0)

## 2022-08-23 LAB — COMPREHENSIVE METABOLIC PANEL
ALT: 24 U/L (ref 0–44)
AST: 29 U/L (ref 15–41)
Albumin: 3.8 g/dL (ref 3.5–5.0)
Alkaline Phosphatase: 90 U/L (ref 38–126)
Anion gap: 11 (ref 5–15)
BUN: 27 mg/dL — ABNORMAL HIGH (ref 8–23)
CO2: 25 mmol/L (ref 22–32)
Calcium: 8.7 mg/dL — ABNORMAL LOW (ref 8.9–10.3)
Chloride: 94 mmol/L — ABNORMAL LOW (ref 98–111)
Creatinine, Ser: 1.39 mg/dL — ABNORMAL HIGH (ref 0.61–1.24)
GFR, Estimated: 51 mL/min — ABNORMAL LOW (ref >60)
Glucose, Bld: 122 mg/dL — ABNORMAL HIGH (ref 70–99)
Potassium: 2.8 mmol/L — ABNORMAL LOW (ref 3.5–5.1)
Sodium: 130 mmol/L — ABNORMAL LOW (ref 135–145)
Total Bilirubin: 1 mg/dL (ref 0.3–1.2)
Total Protein: 7.4 g/dL (ref 6.5–8.1)

## 2022-08-23 LAB — RESP PANEL BY RT-PCR (RSV, FLU A&B, COVID)  RVPGX2
Influenza A by PCR: NEGATIVE
Influenza B by PCR: NEGATIVE
Resp Syncytial Virus by PCR: NEGATIVE
SARS Coronavirus 2 by RT PCR: NEGATIVE

## 2022-08-23 LAB — LACTIC ACID, PLASMA: Lactic Acid, Venous: 2 mmol/L (ref 0.5–1.9)

## 2022-08-23 MED ORDER — LACTATED RINGERS IV BOLUS
1000.0000 mL | Freq: Once | INTRAVENOUS | Status: AC
  Administered 2022-08-23: 1000 mL via INTRAVENOUS

## 2022-08-23 MED ORDER — SODIUM CHLORIDE 0.9 % IV SOLN
2.0000 g | Freq: Once | INTRAVENOUS | Status: AC
  Administered 2022-08-23: 2 g via INTRAVENOUS
  Filled 2022-08-23: qty 12.5

## 2022-08-23 NOTE — H&P (Signed)
History and Physical    Patient: Adam Rios. EI:[id number] DOB: 1941/04/10 DOA: 08/23/2022 DOS: the patient was seen and examined on 08/23/2022 PCP: Dola Argyle, MD  Patient coming from: Home  Chief Complaint:  Chief Complaint  Patient presents with   Weakness   Near Syncope   HPI: Adam Rios. is a 82 y.o. male with medical history significant of recurrent UTIs, cognitive impairment, history of CVA, essential hypertension, renal cell carcinoma, neurogenic bladder, chronic indwelling Foley catheter, some dementia, insomnia, chronic diastolic heart failure and history of right lower extremity cellulitis who was just recently admitted and discharged from the hospital with complex UTI.  Patient was discharged home and is now back with similar complaints.  During her last admission he did very well prior to discharge.  But he is now weak unable to function.  Back with significant hypokalemia, hyponatremia as well as AKI.  He has had poor oral intake.  Urinalysis also showed very dirty urine.  Has evidence of recurrent UTI again.  Patient is being admitted for further evaluation and treatment.  Review of Systems: As mentioned in the history of present illness. All other systems reviewed and are negative. 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[place])    Hypertension    Neurogenic bladder    Renal cell carcinoma ([place]) 2005   Renal disorder    Stroke ([place])     01/17/18, 12/21   Some weakness and cognitive decline   Past Surgical History:  Procedure Laterality Date   APPENDECTOMY     CATARACT EXTRACTION W/PHACO Right 07/22/2020   Procedure: CATARACT EXTRACTION PHACO AND INTRAOCULAR LENS PLACEMENT ([place]) RIGHT;  Surgeon: Eulogio Bear, MD;  Location: [place];  Service: Ophthalmology;  Laterality: Right;  4.77 0:44.2   CATARACT EXTRACTION  W/PHACO Left 08/05/2020   Procedure: CATARACT EXTRACTION PHACO AND INTRAOCULAR LENS PLACEMENT ([facility]) LEFT 10.73 01:13.8;  Surgeon: Eulogio Bear, MD;  Location: [place];  Service: Ophthalmology;  Laterality: Left;  Use eye stretcher not chair   HERNIA REPAIR     LUMBAR FUSION     NEPHRECTOMY Left 2005   TEMPORARY PACEMAKER N/A 05/03/2022   Procedure: TEMPORARY PACEMAKER;  Surgeon: Yolonda Kida, MD;  Location: [facility];  Service: Cardiovascular;  Laterality: N/A;   Social History:  reports that he quit smoking about 19 years ago. His smoking use included cigarettes. He has a 2.50 pack-year smoking history. He has never used smokeless tobacco. He reports that he does not currently use alcohol. He reports that he does not use drugs.  Allergies  Allergen Reactions   Empagliflozin     Recurrent UTI with sepsis on jardiance.   Fentanyl Nausea Only   Oxycodone Nausea And Vomiting    Family History  Problem Relation Age of Onset   Cerebral aneurysm Father    Cerebral aneurysm Sister     Prior to Admission medications   Medication Sig Start Date End Date Taking? Authorizing Provider  atorvastatin (LIPITOR) 40 MG tablet Take 40 mg by mouth daily.   Yes [provider]  donepezil (ARICEPT) 10 MG tablet Take 10 mg by mouth at bedtime.   Yes [provider]  finasteride (PROSCAR) 5 MG tablet Take 5 mg by mouth daily. 04/27/22  Yes [provider]  gabapentin (NEURONTIN) 300 MG capsule Take 1  capsule (300 mg total) by mouth at bedtime. 06/14/22  Yes Vasireddy, Grier Mitts, MD  metoprolol succinate (TOPROL-XL) 25 MG 24 hr tablet Take 25 mg by mouth daily. 08/03/22 08/03/23 Yes [provider]  mirabegron ER (MYRBETRIQ) 25 MG TB24 tablet Take 25 mg by mouth daily.   Yes [provider]  omeprazole (PRILOSEC) 20 MG capsule Take 1 capsule (20 mg total) by mouth daily. 05/21/21  Yes Jennye Boroughs, MD  potassium chloride SA (KLOR-CON M)  20 MEQ tablet Take 1 tablet (20 mEq total) by mouth daily. 08/15/22  Yes Wieting, Richard, MD  torsemide (DEMADEX) 20 MG tablet Take 1 tablet (20 mg total) by mouth 2 (two) times daily. 08/15/22  Yes Wieting, Richard, MD  vitamin B-12 (CYANOCOBALAMIN) 1000 MCG tablet Take 1,000 mcg by mouth daily.   Yes [provider]  acetaminophen (TYLENOL) 325 MG tablet Take 500 mg by mouth every 6 (six) hours as needed. 08/14/14   [provider]  aspirin 81 MG EC tablet Take 81 mg by mouth daily as needed.    [provider]  cyclobenzaprine (FLEXERIL) 5 MG tablet Take 5-10 mg by mouth 3 (three) times daily as needed for muscle spasms. 07/30/22   [provider]  diclofenac Sodium (VOLTAREN) 1 % GEL Apply 2 g topically 2 (two) times daily as needed. 05/11/21   [provider]  hydrocortisone cream ([employer] CORTISONE MAXIMUM STRENGTH) 1 % Apply 1 Application topically as needed for itching.    [provider]  polyethylene glycol (MIRALAX / GLYCOLAX) 17 g packet Take 17 g by mouth daily as needed for severe constipation. 08/15/22   Loletha Grayer, MD  senna-docusate (SENOKOT-S) 8.6-50 MG tablet Take 1 tablet by mouth 2 (two) times daily as needed. 08/15/22   Loletha Grayer, MD    Physical Exam: Vitals:   08/23/22 2113 08/23/22 2115 08/23/22 2116 08/23/22 2200  BP:  110/75  110/86  Pulse: (!) 122 (!) 123  (!) 118  Resp: 18 17  17   Temp:  98.5 F (36.9 C)    TempSrc:  Oral    SpO2: 94% 95%  96%  Weight:   101.2 kg   Height:   6\' 1"  (1.854 m)    Constitutional: Chronically ill looking, frail, NAD, calm, comfortable Eyes: PERRL, lids and conjunctivae normal ENMT: Mucous membranes are dry. Posterior pharynx clear of any exudate or lesions.Normal dentition.  Neck: normal, supple, no masses, no thyromegaly Respiratory: clear to auscultation bilaterally, no wheezing, no crackles. Normal respiratory effort. No accessory muscle use.  Cardiovascular: Sinus  tachycardia, no murmurs / rubs / gallops. No extremity edema. 2+ pedal pulses. No carotid bruits.  Abdomen: no tenderness, no masses palpated. No hepatosplenomegaly. Bowel sounds positive.  Musculoskeletal: Good range of motion, no joint swelling or tenderness, Skin: no rashes, lesions, ulcers. No induration Neurologic: CN 2-12 grossly intact. Sensation intact, DTR normal. Strength 5/5 in all 4.  Psychiatric: Impaired judgment and insight. Alert and oriented x 3. Normal mood  Data Reviewed:  Afebrile but heart rate 123, white count 9.6 hemoglobin 12.8, platelets 209.  Sodium 130 potassium 2.8 chloride 94 CO2 25, BUN is 27 creatinine 1.39 and calcium 8.7.  Glucose 122.  Lactic acid is 2.0.  Acute viral screen is negative.  Urinalysis showed cloudy urine yellow with large leukocytes.  Nitrite negative.  White count more than 50 with few bacteria.  CT abdomen pelvis is currently pending and CT head without contrast pending.  Chest x-ray showed questionable developing  hazy infiltrate in the right upper lobe peripherally..  Assessment and Plan:  #1 generalized weakness: Secondary to recurrent UTI in the elderly.  Patient is failing to thrive at home.  He was discharged home and apparently not able to call.  We will admit the patient.  Get PT and OT consult.  Treat his UTI.  Evaluate for possible short-term rehab.  #2 recurrent complex UTI: Patient will be admitted.  Empirically placed on cefepime.  Await urine culture and sensitivities.  His previous culture was E. coli.  Resistant to quinolones and trimethoprim.  Was sensitive to cefepime.  #3 history of renal cell carcinoma: In remission.  Has chronic indwelling catheter.  Continue catheter care.  #4 BPH with obstruction neuropathy: Continue Foley catheter.  #5 history of diastolic CHF: Patient appears dry.  We will hydrate gently.  #6 history of CVA: Appears stable with no residual effects.  #7 GERD: Continue with PPIs.    Advance Care  Planning:   Code Status: Full Code   Consults: PT and OT consult  Family Communication: No family at bedside  Severity of Illness: The appropriate patient status for this patient is INPATIENT. Inpatient status is judged to be reasonable and necessary in order to provide the required intensity of service to ensure the patient's safety. The patient's presenting symptoms, physical exam findings, and initial radiographic and laboratory data in the context of their chronic comorbidities is felt to place them at high risk for further clinical deterioration. Furthermore, it is not anticipated that the patient will be medically stable for discharge from the hospital within 2 midnights of admission.   * I certify that at the point of admission it is my clinical judgment that the patient will require inpatient hospital care spanning beyond 2 midnights from the point of admission due to high intensity of service, high risk for further deterioration and high frequency of surveillance required.*  AuthorBarbette Merino, MD 08/23/2022 11:41 PM  For on call review [email or web address].

## 2022-08-23 NOTE — ED Notes (Signed)
Adam Balboa, MD, made aware of lactic 2.0

## 2022-08-23 NOTE — ED Provider Notes (Signed)
[facility] Provider Note    Event Date/Time   First MD Initiated Contact with Patient 08/23/22 2110     (approximate)   History   Weakness and Near Syncope   HPI  Adam Giovino. is a 82 y.o. male   who presents to the emergency department today because of concerns for confusion and weakness.  Patient is unable to give significant history as he is somewhat confused as to what is been going on.  He does state that he has been living with his daughter and he is having a hard time taking care of himself.  For example today he defecated and was unable to clean himself effectively.  The patient was recently in the hospital and discharged after admission for sepsis secondary to urinary tract infection.  Patient denies any fevers or chills since going home.    Physical Exam   Triage Vital Signs: ED Triage Vitals  Enc Vitals Group     BP 08/23/22 2115 110/75     Pulse Rate 08/23/22 2113 (!) 122     Resp 08/23/22 2113 (!) 22     Temp 08/23/22 2115 98.5 F (36.9 C)     Temp Source 08/23/22 2115 Oral     SpO2 08/23/22 2113 94 %     Weight 08/23/22 2116 223 lb (101.2 kg)     Height 08/23/22 2116 6\' 1"  (1.854 m)     Head Circumference --      Peak Flow --      Pain Score 08/23/22 2114 8     Pain Loc --      Pain Edu? --      Excl. in [place]? --     Most recent vital signs: Vitals:   08/23/22 2113 08/23/22 2115  BP:  110/75  Pulse: (!) 122 (!) 123  Resp: (!) 22 17  Temp:  98.5 F (36.9 C)  SpO2: 94% 95%   General: Awake, alert, oriented. CV:  Good peripheral perfusion. Tachycardia. Resp:  Normal effort. Lungs clear. Abd:  No distention.    ED Results / Procedures / Treatments   Labs (all labs ordered are listed, but only abnormal results are displayed) Labs Reviewed  LACTIC ACID, PLASMA - Abnormal; Notable for the following components:      Result Value   Lactic Acid, Venous 2.0 (*)    All other components within normal limits   COMPREHENSIVE METABOLIC PANEL - Abnormal; Notable for the following components:   Sodium 130 (*)    Potassium 2.8 (*)    Chloride 94 (*)    Glucose, Bld 122 (*)    BUN 27 (*)    Creatinine, Ser 1.39 (*)    Calcium 8.7 (*)    GFR, Estimated 51 (*)    All other components within normal limits  CBC WITH DIFFERENTIAL/PLATELET - Abnormal; Notable for the following components:   Hemoglobin 12.8 (*)    RDW 16.3 (*)    All other components within normal limits  URINALYSIS, COMPLETE (UACMP) WITH MICROSCOPIC - Abnormal; Notable for the following components:   Color, Urine YELLOW (*)    APPearance CLOUDY (*)    Hgb urine dipstick SMALL (*)    Protein, ur 100 (*)    Leukocytes,Ua LARGE (*)    Bacteria, UA FEW (*)    All other components within normal limits  RESP PANEL BY RT-PCR (RSV, FLU A&B, COVID)  RVPGX2  CULTURE, BLOOD (ROUTINE X 2)  CULTURE, BLOOD (  ROUTINE X 2)  LACTIC ACID, PLASMA  PROCALCITONIN     EKG  I, Nance Pear, attending physician, personally viewed and interpreted this EKG  EKG Time: 2119 Rate: 125 Rhythm: sinus tachycardia Axis: left axis deviation Intervals: qtc 518 QRS: narrow ST changes: no st elevation Impression: abnormal ekg    RADIOLOGY I independently interpreted and visualized the CXR. My interpretation: No pneumonia Radiology interpretation: IMPRESSION:  1. Questionable developing hazy infiltrate right upper lobe  periphery. Followup PA and lateral chest X-ray is recommended to see  if this persists. Otherwise, if pneumonia is clinically suspected, a  follow-up study is recommended after treatment to ensure clearing.  2. Chronic elevation of the right hemidiaphragm.  3. Aortic atherosclerosis.     PROCEDURES:  Critical Care performed: Yes  CRITICAL CARE Performed by: Nance Pear   Total critical care time: 30 minutes  Critical care time was exclusive of separately billable procedures and treating other patients.  Critical  care was necessary to treat or prevent imminent or life-threatening deterioration.  Critical care was time spent personally by me on the following activities: development of treatment plan with patient and/or surrogate as well as nursing, discussions with consultants, evaluation of patient's response to treatment, examination of patient, obtaining history from patient or surrogate, ordering and performing treatments and interventions, ordering and review of laboratory studies, ordering and review of radiographic studies, pulse oximetry and re-evaluation of patient's condition.   Procedures    MEDICATIONS ORDERED IN ED: Medications - No data to display   IMPRESSION / MDM / [place] / ED COURSE  I reviewed the triage vital signs and the nursing notes.                              Differential diagnosis includes, but is not limited to, infection, anemia, deconditioning  Patient's presentation is most consistent with acute presentation with potential threat to life or bodily function.   The patient is on the cardiac monitor to evaluate for evidence of arrhythmia and/or significant heart rate changes.  Patient presented to the emergency department today because of concerns for confusion and weakness.  Patient was just discharged from the hospital a week ago.  It does sound like he is having a hard time taking care of himself at home.  Initial vital signs here with some tachycardia.  Afebrile.  Blood work is concerning for dehydration with slight AKI, hyponatremia and hypochloremia.  No leukocytosis in the blood work but there is slight lactic acidosis of 2.0.  This could be related to the dehydration or potentially infection.  Chest x-ray shows possible pneumonia although patient denies respiratory complaints.  Urine however is concerning for recurrent urinary tract infection with greater than 50 white blood cells and white blood cell clumps.  Will start cefepime.  Will consult  hospitalist service for admission.      FINAL CLINICAL IMPRESSION(S) / ED DIAGNOSES   Final diagnoses:  Confusion  AKI (acute kidney injury) ([place])  Urinary tract infection without hematuria, site unspecified    Note:  This document was prepared using Dragon voice recognition software and may include unintentional dictation errors.    Nance Pear, MD 08/23/22 [phone]

## 2022-08-23 NOTE — ED Triage Notes (Signed)
Pt arrives via [facility] from home. EMS was called out for near syncope/weakness. Pt was recently admitted for a UTI and was discharged about 7 days ago. Pt did take full course of antibiotics. Pt is alert and able to answer all orientation questions correctly. Pt reports 8/10 chronic foot pain, but denies any other pain. Denies difficulty with urination, but does have an indwelling catheter that he has had for 12 years. Pt does report diarrhea that started 2 days ago.

## 2022-08-23 NOTE — ED Provider Notes (Signed)
-----------------------------------------   11:16 PM on 08/23/2022 -----------------------------------------  Blood pressure 110/86, pulse (!) 118, temperature 98.5 F (36.9 C), temperature source Oral, resp. rate 17, height 6\' 1"  (1.854 m), weight 101.2 kg, SpO2 96 %.  Assuming care from Dr. Archie Balboa.  In short, Adam Rios. is a 82 y.o. male with a chief complaint of Weakness and Near Syncope .  Refer to the original H&P for additional details.  The current plan of care is to follow-up UA and additional labs.  ----------------------------------------- 11:38 PM on 08/23/2022 ----------------------------------------- Urinalysis concerning for infection, testing for COVID-19 is unremarkable.  Given increasing weakness and concern for sepsis, case discussed with hospitalist for admission.    Blake Divine, MD 08/23/22 [phone]

## 2022-08-24 ENCOUNTER — Other Ambulatory Visit: Payer: Self-pay

## 2022-08-24 DIAGNOSIS — N401 Enlarged prostate with lower urinary tract symptoms: Secondary | ICD-10-CM | POA: Diagnosis not present

## 2022-08-24 DIAGNOSIS — I10 Essential (primary) hypertension: Secondary | ICD-10-CM

## 2022-08-24 DIAGNOSIS — Z8619 Personal history of other infectious and parasitic diseases: Secondary | ICD-10-CM | POA: Diagnosis not present

## 2022-08-24 DIAGNOSIS — I5032 Chronic diastolic (congestive) heart failure: Secondary | ICD-10-CM

## 2022-08-24 DIAGNOSIS — R531 Weakness: Secondary | ICD-10-CM | POA: Diagnosis not present

## 2022-08-24 DIAGNOSIS — N138 Other obstructive and reflux uropathy: Secondary | ICD-10-CM

## 2022-08-24 LAB — COMPREHENSIVE METABOLIC PANEL
ALT: 18 U/L (ref 0–44)
AST: 23 U/L (ref 15–41)
Albumin: 3 g/dL — ABNORMAL LOW (ref 3.5–5.0)
Alkaline Phosphatase: 69 U/L (ref 38–126)
Anion gap: 12 (ref 5–15)
BUN: 25 mg/dL — ABNORMAL HIGH (ref 8–23)
CO2: 25 mmol/L (ref 22–32)
Calcium: 8.5 mg/dL — ABNORMAL LOW (ref 8.9–10.3)
Chloride: 96 mmol/L — ABNORMAL LOW (ref 98–111)
Creatinine, Ser: 1.16 mg/dL (ref 0.61–1.24)
GFR, Estimated: >60 mL/min (ref >60)
Glucose, Bld: 123 mg/dL — ABNORMAL HIGH (ref 70–99)
Potassium: 3.1 mmol/L — ABNORMAL LOW (ref 3.5–5.1)
Sodium: 133 mmol/L — ABNORMAL LOW (ref 135–145)
Total Bilirubin: 0.8 mg/dL (ref 0.3–1.2)
Total Protein: 6 g/dL — ABNORMAL LOW (ref 6.5–8.1)

## 2022-08-24 LAB — CBC
HCT: 32.7 % — ABNORMAL LOW (ref 39.0–52.0)
Hemoglobin: 10.6 g/dL — ABNORMAL LOW (ref 13.0–17.0)
MCH: 26.8 pg (ref 26.0–34.0)
MCHC: 32.4 g/dL (ref 30.0–36.0)
MCV: 82.6 fL (ref 80.0–100.0)
Platelets: 154 10*3/uL (ref 150–400)
RBC: 3.96 MIL/uL — ABNORMAL LOW (ref 4.22–5.81)
RDW: 16.3 % — ABNORMAL HIGH (ref 11.5–15.5)
WBC: 8.6 10*3/uL (ref 4.0–10.5)
nRBC: 0 % (ref 0.0–0.2)

## 2022-08-24 LAB — LACTIC ACID, PLASMA: Lactic Acid, Venous: 1.5 mmol/L (ref 0.5–1.9)

## 2022-08-24 LAB — MAGNESIUM: Magnesium: 1.8 mg/dL (ref 1.7–2.4)

## 2022-08-24 LAB — BRAIN NATRIURETIC PEPTIDE: B Natriuretic Peptide: 193.7 pg/mL — ABNORMAL HIGH (ref 0.0–100.0)

## 2022-08-24 LAB — C DIFFICILE QUICK SCREEN W PCR REFLEX
C Diff antigen: NEGATIVE
C Diff interpretation: NOT DETECTED
C Diff toxin: NEGATIVE

## 2022-08-24 LAB — POTASSIUM: Potassium: 2.9 mmol/L — ABNORMAL LOW (ref 3.5–5.1)

## 2022-08-24 LAB — PROCALCITONIN: Procalcitonin: <0.1 ng/mL

## 2022-08-24 MED ORDER — ORAL CARE MOUTH RINSE: 15.0000 mL | OROMUCOSAL | Status: DC | PRN

## 2022-08-24 MED ORDER — FINASTERIDE 5 MG PO TABS
5.0000 mg | ORAL_TABLET | Freq: Every day | ORAL | Status: DC
  Administered 2022-08-24 – 2022-08-28 (×5): 5 mg via ORAL
  Filled 2022-08-24 (×5): qty 1

## 2022-08-24 MED ORDER — KCL-LACTATED RINGERS-D5W 20 MEQ/L IV SOLN
INTRAVENOUS | Status: DC
  Filled 2022-08-24 (×4): qty 1000

## 2022-08-24 MED ORDER — POTASSIUM CHLORIDE 10 MEQ/100ML IV SOLN
10.0000 meq | INTRAVENOUS | Status: AC
  Administered 2022-08-24 (×3): 10 meq via INTRAVENOUS
  Filled 2022-08-24 (×3): qty 100

## 2022-08-24 MED ORDER — ONDANSETRON HCL 4 MG PO TABS: 4.0000 mg | ORAL_TABLET | Freq: Four times a day (QID) | ORAL | Status: DC | PRN

## 2022-08-24 MED ORDER — ENOXAPARIN SODIUM 60 MG/0.6ML IJ SOSY
0.5000 mg/kg | PREFILLED_SYRINGE | INTRAMUSCULAR | Status: DC
  Administered 2022-08-24 – 2022-08-28 (×5): 50 mg via SUBCUTANEOUS
  Filled 2022-08-24 (×5): qty 0.6

## 2022-08-24 MED ORDER — SENNOSIDES-DOCUSATE SODIUM 8.6-50 MG PO TABS: 1.0000 | ORAL_TABLET | Freq: Two times a day (BID) | ORAL | Status: DC | PRN

## 2022-08-24 MED ORDER — SODIUM CHLORIDE 0.9 % IV SOLN: 1.0000 g | Freq: Two times a day (BID) | INTRAVENOUS | Status: DC

## 2022-08-24 MED ORDER — ATORVASTATIN CALCIUM 20 MG PO TABS
40.0000 mg | ORAL_TABLET | Freq: Every day | ORAL | Status: DC
  Administered 2022-08-24 – 2022-08-28 (×5): 40 mg via ORAL
  Filled 2022-08-24 (×5): qty 2

## 2022-08-24 MED ORDER — CHLORHEXIDINE GLUCONATE CLOTH 2 % EX PADS
6.0000 | MEDICATED_PAD | Freq: Every day | CUTANEOUS | Status: DC
  Administered 2022-08-24 – 2022-08-28 (×5): 6 via TOPICAL

## 2022-08-24 MED ORDER — ACETAMINOPHEN 650 MG RE SUPP: 650.0000 mg | Freq: Four times a day (QID) | RECTAL | Status: DC | PRN

## 2022-08-24 MED ORDER — ONDANSETRON HCL 4 MG/2ML IJ SOLN: 4.0000 mg | Freq: Four times a day (QID) | INTRAMUSCULAR | Status: DC | PRN

## 2022-08-24 MED ORDER — METOPROLOL SUCCINATE ER 25 MG PO TB24
25.0000 mg | ORAL_TABLET | Freq: Every day | ORAL | Status: DC
  Administered 2022-08-25 – 2022-08-28 (×4): 25 mg via ORAL
  Filled 2022-08-24 (×5): qty 1

## 2022-08-24 MED ORDER — VITAMIN B-12 1000 MCG PO TABS
1000.0000 ug | ORAL_TABLET | Freq: Every day | ORAL | Status: DC
  Administered 2022-08-24 – 2022-08-28 (×5): 1000 ug via ORAL
  Filled 2022-08-24 (×5): qty 1

## 2022-08-24 MED ORDER — POTASSIUM CHLORIDE 10 MEQ/100ML IV SOLN
10.0000 meq | INTRAVENOUS | Status: AC
  Administered 2022-08-24 (×2): 10 meq via INTRAVENOUS
  Filled 2022-08-24 (×2): qty 100

## 2022-08-24 MED ORDER — PANTOPRAZOLE SODIUM 40 MG PO TBEC
40.0000 mg | DELAYED_RELEASE_TABLET | Freq: Every day | ORAL | Status: DC
  Administered 2022-08-24 – 2022-08-28 (×5): 40 mg via ORAL
  Filled 2022-08-24 (×5): qty 1

## 2022-08-24 MED ORDER — DONEPEZIL HCL 5 MG PO TABS
10.0000 mg | ORAL_TABLET | Freq: Every day | ORAL | Status: DC
  Administered 2022-08-24 – 2022-08-27 (×4): 10 mg via ORAL
  Filled 2022-08-24 (×4): qty 2

## 2022-08-24 MED ORDER — MIRABEGRON ER 25 MG PO TB24
25.0000 mg | ORAL_TABLET | Freq: Every day | ORAL | Status: DC
  Administered 2022-08-24 – 2022-08-27 (×4): 25 mg via ORAL
  Filled 2022-08-24 (×4): qty 1

## 2022-08-24 MED ORDER — GABAPENTIN 300 MG PO CAPS
300.0000 mg | ORAL_CAPSULE | Freq: Every day | ORAL | Status: DC
  Administered 2022-08-24 – 2022-08-27 (×5): 300 mg via ORAL
  Filled 2022-08-24 (×5): qty 1

## 2022-08-24 MED ORDER — ACETAMINOPHEN 325 MG PO TABS
650.0000 mg | ORAL_TABLET | Freq: Four times a day (QID) | ORAL | Status: DC | PRN
  Administered 2022-08-27 – 2022-08-28 (×2): 650 mg via ORAL
  Filled 2022-08-24 (×2): qty 2

## 2022-08-24 MED ORDER — SODIUM CHLORIDE 0.9 % IV SOLN: 2.0000 g | Freq: Two times a day (BID) | INTRAVENOUS | Status: DC

## 2022-08-24 MED ORDER — POTASSIUM CHLORIDE CRYS ER 20 MEQ PO TBCR
40.0000 meq | EXTENDED_RELEASE_TABLET | Freq: Once | ORAL | Status: AC
  Administered 2022-08-24: 40 meq via ORAL
  Filled 2022-08-24: qty 2

## 2022-08-24 MED ORDER — SODIUM CHLORIDE 0.9 % IV SOLN
2.0000 g | INTRAVENOUS | Status: DC
  Administered 2022-08-24: 2 g via INTRAVENOUS
  Filled 2022-08-24: qty 12.5
  Filled 2022-08-24: qty 2

## 2022-08-24 NOTE — Assessment & Plan Note (Signed)
Continue statin. 

## 2022-08-24 NOTE — Assessment & Plan Note (Signed)
Blood pressure with an lower normal limit. Mild intermittent tachycardia. -Continue with home metoprolol if systolic above [id number]

## 2022-08-24 NOTE — Assessment & Plan Note (Signed)
Patient with history of recurrent UTIs with chronic indwelling catheter in place.  UA still looked dirty but no leukocytosis or fever.  No urinary symptoms except chronic dribbling around Foley catheter.  He was placed on cefepime based on most recent urine culture. -Continue antibiotics for another day and if blood cultures remain negative we will discontinue them

## 2022-08-24 NOTE — Progress Notes (Signed)
Pharmacy Antibiotic Note  Natrell Koss. is a 82 y.o. male admitted on 08/23/2022 with UTI.  Pharmacy has been consulted for Cefepime dosing.  Plan: Cefepime 2 gm q24hr per indication & renal fxn.  Pharmacy will continue to follow and will adjust abx dosing whenever warranted.  Temp (24hrs), Avg:98.5 F (36.9 C), Min:98.5 F (36.9 C), Max:98.5 F (36.9 C)   Recent Labs  Lab 08/23/22 2142  WBC 9.6  CREATININE 1.39*  LATICACIDVEN 2.0*    Estimated Creatinine Clearance: 52.1 mL/min (A) (by C-G formula based on SCr of 1.39 mg/dL (H)).    Allergies  Allergen Reactions   Empagliflozin     Recurrent UTI with sepsis on jardiance.   Fentanyl Nausea Only   Oxycodone Nausea And Vomiting    Antimicrobials this admission: 3/24 Cefepime >>   Microbiology results: 3/24 BCx: Pending  Thank you for allowing pharmacy to be a part of this patient's care.  Renda Rolls, PharmD, [facility] 08/24/2022 12:04 AM

## 2022-08-24 NOTE — Assessment & Plan Note (Signed)
Clinically appears dry.  Holding home torsemide as patient is being given gentle IV fluid. -Check BNP

## 2022-08-24 NOTE — Assessment & Plan Note (Signed)
Most likely secondary to torsemide use.  Magnesium at 1.8 -Replace potassium and monitor

## 2022-08-24 NOTE — Progress Notes (Signed)
Pharmacy Antibiotic Note  Adam Rios. is a 82 y.o. male admitted on 08/23/2022 with UTI.  Pharmacy has been consulted for Cefepime dosing.  Day 2 of cefepime. CrCl > 11mL/min  Plan: Cefepime 2 gm q24hr per indication & renal fxn.  Pharmacy will continue to follow and will adjust abx dosing whenever warranted.  Temp (24hrs), Avg:98 F (36.7 C), Min:97.7 F (36.5 C), Max:98.5 F (36.9 C)   Recent Labs  Lab 08/23/22 2142 08/24/22 0603  WBC 9.6 8.6  CREATININE 1.39* 1.16  LATICACIDVEN 1.5  2.0*  --      Estimated Creatinine Clearance: 56.4 mL/min (by C-G formula based on SCr of 1.16 mg/dL).    Allergies  Allergen Reactions   Empagliflozin     Recurrent UTI with sepsis on jardiance.   Fentanyl Nausea Only   Oxycodone Nausea And Vomiting    Antimicrobials this admission: 3/24 Cefepime >>   Microbiology results: 3/24 BCx: NG < 12 hours  Thank you for allowing pharmacy to be a part of this patient's care.  Glean Salvo, PharmD, BCPS Clinical Pharmacist  08/24/2022 4:01 PM

## 2022-08-24 NOTE — Hospital Course (Addendum)
Taken from H&P.  Adam Locket. is a 82 y.o. male with medical history significant of recurrent UTIs, cognitive impairment, history of CVA, essential hypertension, renal cell carcinoma, neurogenic bladder, chronic indwelling Foley catheter, some dementia, insomnia, chronic diastolic heart failure and history of right lower extremity cellulitis who was just recently admitted and discharged from the hospital with complex UTI.  Patient was discharged home and is now back with similar complaints.  During her last admission he did very well prior to discharge.  But he is now weak unable to function.  Back with significant hypokalemia, hyponatremia as well as AKI.  He has had poor oral intake.  Urinalysis also showed very dirty urine.  Has evidence of recurrent UTI again.   Our physical therapist recommended going to rehab during most recent hospitalization but patient refused and made that poor choice, he was sent home with home health but he does not have any other help at home to make to his appointments.  During this most recent hospitalization he was also found to have urine leaking around the Foley catheter, urology was consulted and it does appear to be a chronic issue for which he is using depends at home.  They were recommending follow-up with his own urologist at [facility].    3/25: Blood pressure borderline soft at 94/59, rest of vitals stable.  Improving sodium and potassium, procalcitonin negative.  Lactic acidosis has been resolved-most likely secondary to poor p.o. intake and dehydration.  UA does look dirty but most likely colonization due to chronic indwelling catheter.  Currently on cefepime-if blood cultures remain negative in 24 hours then we can discontinue antibiotics as he recently received adequate treatment for UTI. Pending PT/OT evaluation.  TOC was consulted for placement based on recent recommendations.  3/26: Vitals stable, labs with mild but improving hypokalemia.  Blood cultures  remain negative.  We will discontinue antibiotics.  C. difficile PCR was negative.  BNP slightly elevated at 193.  Discontinuing IV fluid.

## 2022-08-24 NOTE — Assessment & Plan Note (Signed)
Continue PPI ?

## 2022-08-24 NOTE — Progress Notes (Signed)
Patient's BP 88/73 this afternoon. MD Amin aware, metoprolol held.

## 2022-08-24 NOTE — Progress Notes (Signed)
Progress Note   Patient: Adam Rios. [id number] DOB: Apr 07, 1941 DOA: 08/23/2022     1 DOS: the patient was seen and examined on 08/24/2022   Brief hospital course: Taken from H&P.  Adam Locket. is a 82 y.o. male with medical history significant of recurrent UTIs, cognitive impairment, history of CVA, essential hypertension, renal cell carcinoma, neurogenic bladder, chronic indwelling Foley catheter, some dementia, insomnia, chronic diastolic heart failure and history of right lower extremity cellulitis who was just recently admitted and discharged from the hospital with complex UTI.  Patient was discharged home and is now back with similar complaints.  During her last admission he did very well prior to discharge.  But he is now weak unable to function.  Back with significant hypokalemia, hyponatremia as well as AKI.  He has had poor oral intake.  Urinalysis also showed very dirty urine.  Has evidence of recurrent UTI again.   Our physical therapist recommended going to rehab during most recent hospitalization but patient refused and made that poor choice, he was sent home with home health but he does not have any other help at home to make to his appointments.  During this most recent hospitalization he was also found to have urine leaking around the Foley catheter, urology was consulted and it does appear to be a chronic issue for which he is using depends at home.  They were recommending follow-up with his own urologist at [facility].    3/25: Blood pressure borderline soft at 94/59, rest of vitals stable.  Improving sodium and potassium, procalcitonin negative.  Lactic acidosis has been resolved-most likely secondary to poor p.o. intake and dehydration.  UA does look dirty but most likely colonization due to chronic indwelling catheter.  Currently on cefepime-if blood cultures remain negative in 24 hours then we can discontinue antibiotics as he recently received adequate treatment for  UTI. Pending PT/OT evaluation.  TOC was consulted for placement based on recent recommendations.  Assessment and Plan: * Generalized weakness Patient with history of recurrent UTI and failure to thrive at home.  Unable to take care of himself and appears dehydrated. PT recommended SNF during most recent hospitalization but he declined at that time and decided to go home with home health. Patient would like to proceed with rehab placement at this time. -PT/OT evaluation  History of recurrent UTI and ESBL UTI  Patient with history of recurrent UTIs with chronic indwelling catheter in place.  UA still looked dirty but no leukocytosis or fever.  No urinary symptoms except chronic dribbling around Foley catheter.  He was placed on cefepime based on most recent urine culture. -Continue antibiotics for another day and if blood cultures remain negative we will discontinue them   Renal cell carcinoma s/p nephrectomy, left Apparently in remission. -Continue outpatient follow-up  BPH with obstruction/lower urinary tract symptoms Being managed by his urologist at [facility].  History of recent procedure and a chronic indwelling catheter is in place. -Continue with catheter care -Continue home Myrbetriq and finasteride -Continue with outpatient urology follow-up  Essential hypertension Blood pressure with an lower normal limit. Mild intermittent tachycardia. -Continue with home metoprolol if systolic above [id number]  Hypokalemia Most likely secondary to torsemide use.  Magnesium at 1.8 -Replace potassium and monitor  History of CVA (cerebrovascular accident) -Continue statin  GERD (gastroesophageal reflux disease) -Continue PPI  Heart failure with preserved ejection fraction (HCC) Clinically appears dry.  Holding home torsemide as patient is being given gentle IV fluid. -  Check BNP  Dyslipidemia -Continue statin   Subjective: Patient was feeling weak when seen today.  He was sleeping but easily  arousable.  We discussed about going to rehab as he is unable to take care of himself at home.  Patient I agree stating that to what ever you think is better for me.  Physical Exam: Vitals:   08/23/22 2300 08/24/22 0130 08/24/22 0506 08/24/22 0748  BP: 109/66 (!) 98/56 92/63 (!) 94/59  Pulse: (!) 112 81 86 68  Resp: 14 16 18 18   Temp:  97.7 F (36.5 C) 98 F (36.7 C) 97.8 F (36.6 C)  TempSrc:  Axillary Axillary   SpO2: 94% 97% 95% 93%  Weight:  95 kg    Height:  6\' 1"  (1.854 m)     General.  Chronically ill-appearing elderly man, in no acute distress. Pulmonary.  Lungs clear bilaterally, normal respiratory effort. CV.  Regular rate and rhythm, no JVD, rub or murmur. Abdomen.  Soft, nontender, nondistended, BS positive. CNS.  Alert and oriented .  No focal neurologic deficit. Extremities.  No edema, no cyanosis, pulses intact and symmetrical. Psychiatry.  Appears to have some cognitive impairment  Data Reviewed: Prior data reviewed  Family Communication: Discussed with daughter on phone  Disposition: Status is: Inpatient Remains inpatient appropriate because: Severity of illness  Planned Discharge Destination: Skilled nursing facility  Time spent: 45 minutes  This record has been created using Systems analyst. Errors have been sought and corrected,but may not always be located. Such creation errors do not reflect on the standard of care.   Author: Lorella Nimrod, MD 08/24/2022 12:11 PM  For on call review [email or web address].

## 2022-08-24 NOTE — Evaluation (Signed)
Physical Therapy Evaluation Patient Details Name: Adam Rios. MRN: [id number] DOB: Nov 17, 1940 Today's Date: 08/24/2022  History of Present Illness  Pt is an 82 y/o M admitted on 08/23/22 after presenting with c/o weakness. Pt found to have hypokalemia, hyponatremia, & AKI. Pt is being treated for recurrent complex UTI & generalized weakness. PMH: recurrent UTIs, cognitive impairment, CVA, HTN, renal cell carcinoma, neurogenic bladder, chronic indwelling Foley catheter, dementia, insomnia, chronic diastolic heart failure, RLE cellulitis  Clinical Impression  Pt seen for PT evaluation with OT joining for co-tx. Pt poor historian re: PLOF so called pt's daughter via telephone to confirm. Prior to pt's initial hospitalization pt was able to ambulate household distances with rollator & mod I. On this date, pt requires min assist for bed mobility & min assist for transfers. Pt presents with global weakness & BM incontinence. Pt would benefit from ongoing PT services to address deficits noted below to reduce fall risk. BP at end of session in LUE sitting in recliner: 101/70 mmHg MAP 82, HR 113 bpm.   Recommendations for follow up therapy are one component of a multi-disciplinary discharge planning process, led by the attending physician.  Recommendations may be updated based on patient status, additional functional criteria and insurance authorization.  Follow Up Recommendations Can patient physically be transported by private vehicle: No     Assistance Recommended at Discharge Frequent or constant Supervision/Assistance  Patient can return home with the following  A little help with bathing/dressing/bathroom;Assistance with cooking/housework;Assist for transportation;Help with stairs or ramp for entrance;A little help with walking and/or transfers    Equipment Recommendations None recommended by PT (TBD in next venue)  Recommendations for Other Services       Functional Status Assessment  Patient has had a recent decline in their functional status and demonstrates the ability to make significant improvements in function in a reasonable and predictable amount of time.     Precautions / Restrictions Precautions Precautions: Fall Restrictions Weight Bearing Restrictions: No      Mobility  Bed Mobility Overal bed mobility: Needs Assistance Bed Mobility: Supine to Sit Rolling: Min assist         General bed mobility comments: extra time to move BLE to EOB, assistance to upright trunk    Transfers Overall transfer level: Needs assistance Equipment used: Rolling walker (2 wheels) Transfers: Sit to/from Stand Sit to Stand: Min assist   Step pivot transfers: Min assist, Min guard, +2 safety/equipment       General transfer comment: Cuing re: hand placement during STS, assistance to power up. Pt performs step pivot bed>recliner, recliner<>BSC with RW.    Ambulation/Gait                  Stairs            Wheelchair Mobility    Modified Rankin (Stroke Patients Only)       Balance Overall balance assessment: Needs assistance Sitting-balance support: Feet supported Sitting balance-Leahy Scale: Good Sitting balance - Comments: close supervision static sitting   Standing balance support: Bilateral upper extremity supported, Reliant on assistive device for balance, During functional activity Standing balance-Leahy Scale: Poor                               Pertinent Vitals/Pain Pain Assessment Pain Assessment: No/denies pain    Home Living Family/patient expects to be discharged to:: Private residence Living Arrangements: Children Available Help at Discharge:  Family;Available PRN/intermittently (pt's son-in-law works 3rd shift & sleeps during the day, pt's daughter works outside of the home during the day) Type of Home: House Home Access: Stairs to enter Entrance Stairs-Rails: None Technical brewer of Steps: 1 partial  step   Home Layout: Able to live on main level with bedroom/bathroom Home Equipment: Air cabin crew (4 wheels);Wheelchair - manual;Grab bars - toilet;Grab bars - tub/shower Additional Comments: lives with daughter, son in law and grand children, receives meals on wheels & has a personal care aide 1x/week to assist with bathing.    Prior Function Prior Level of Function : Needs assist             Mobility Comments: Pt sleeps in lift chair. Prior to last hospital admission pt was ambulatory with rollator, ambulating household distances, receiving Meals on Wheels. ADLs Comments: Has a personal care aide 1x/week that assists with bathing, requires some assistance for dressing but pt notes he has a sock aide. Pt uses briefs 2/2 bladder leakage but BM incontinence is new.     Hand Dominance        Extremity/Trunk Assessment   Upper Extremity Assessment Upper Extremity Assessment: Generalized weakness    Lower Extremity Assessment Lower Extremity Assessment: Generalized weakness       Communication   Communication: No difficulties  Cognition Arousal/Alertness: Awake/alert Behavior During Therapy: Flat affect Overall Cognitive Status: Impaired/Different from baseline Area of Impairment: Memory, Following commands, Safety/judgement, Awareness, Problem solving                     Memory: Decreased short-term memory, Decreased recall of precautions Following Commands: Follows one step commands consistently, Follows one step commands with increased time Safety/Judgement: Decreased awareness of safety, Decreased awareness of deficits Awareness: Emergent   General Comments: Pt provides inaccurate PLOF information (called daughter to confirm).        General Comments General comments (skin integrity, edema, etc.): Pt with incontinent BM during step pivot bed>recliner, so assisted onto [facility] for additional BM. OT provides assistance for peri hygiene & pt assisted with  changing into clean gown.    Exercises     Assessment/Plan    PT Assessment Patient needs continued PT services  PT Problem List Decreased range of motion;Decreased strength;Decreased activity tolerance;Decreased balance;Decreased mobility;Decreased cognition;Decreased knowledge of use of DME;Decreased safety awareness;Cardiopulmonary status limiting activity       PT Treatment Interventions DME instruction;Stair training;Gait training;Functional mobility training;Therapeutic activities;Therapeutic exercise;Balance training;Cognitive remediation;Patient/family education;Neuromuscular re-education    PT Goals (Current goals can be found in the Care Plan section)  Acute Rehab PT Goals Patient Stated Goal: get better PT Goal Formulation: With patient Time For Goal Achievement: 09/07/22 Potential to Achieve Goals: Fair    Frequency Min 2X/week     Co-evaluation PT/OT/SLP Co-Evaluation/Treatment: Yes Reason for Co-Treatment:  (pt fatigued) PT goals addressed during session: Mobility/safety with mobility;Balance;Proper use of DME         AM-PAC PT "6 Clicks" Mobility  Outcome Measure Help needed turning from your back to your side while in a flat bed without using bedrails?: A Little Help needed moving from lying on your back to sitting on the side of a flat bed without using bedrails?: A Lot Help needed moving to and from a bed to a chair (including a wheelchair)?: A Little Help needed standing up from a chair using your arms (e.g., wheelchair or bedside chair)?: A Little Help needed to walk in hospital room?: A Lot Help needed climbing 3-5  steps with a railing? : Total 6 Click Score: 14    End of Session   Activity Tolerance: Patient limited by fatigue;Patient tolerated treatment well Patient left: in chair;with chair alarm set;with call bell/phone within reach Nurse Communication: Mobility status (bladder leaking from penis even with catheter in) PT Visit Diagnosis:  Muscle weakness (generalized) (M62.81);Difficulty in walking, not elsewhere classified (R26.2);Unsteadiness on feet (R26.81)    Time: [id number] PT Time Calculation (min) (ACUTE ONLY): 27 min   Charges:   PT Evaluation $PT Eval Low Complexity: 1 Low PT Treatments $Therapeutic Activity: 8-22 mins        Lavone Nian, PT, DPT 08/24/22, 1:45 PM   Waunita Schooner 08/24/2022, 1:43 PM

## 2022-08-24 NOTE — Assessment & Plan Note (Signed)
Apparently in remission. -Continue outpatient follow-up

## 2022-08-24 NOTE — Progress Notes (Signed)
PHARMACIST - PHYSICIAN COMMUNICATION  CONCERNING:  Enoxaparin (Lovenox) for DVT Prophylaxis    RECOMMENDATION: Patient was prescribed enoxaprin 40mg  q24 hours for VTE prophylaxis.   Filed Weights   08/23/22 2116  Weight: 101.2 kg (223 lb)    Body mass index is 29.42 kg/m.  Estimated Creatinine Clearance: 52.1 mL/min (A) (by C-G formula based on SCr of 1.39 mg/dL (H)).   Based on [place] patient is candidate for enoxaparin 0.5mg /kg TBW SQ every 24 hours based on BMI being >30.  DESCRIPTION: Pharmacy has adjusted enoxaparin dose per [facility] policy.  Patient is now receiving enoxaparin 0.5 mg/kg every 24 hours   Renda Rolls, PharmD, [facility] 08/24/2022 1:14 AM

## 2022-08-24 NOTE — Evaluation (Signed)
Occupational Therapy Evaluation Patient Details Name: Adam Rios. MRN: [id number] DOB: 03/18/1941 Today's Date: 08/24/2022   History of Present Illness Pt is an 82 y/o M admitted on 08/23/22 after presenting with c/o weakness. Pt found to have hypokalemia, hyponatremia, & AKI. Pt is being treated for recurrent complex UTI & generalized weakness. PMH: recurrent UTIs, cognitive impairment, CVA, HTN, renal cell carcinoma, neurogenic bladder, chronic indwelling Foley catheter, dementia, insomnia, chronic diastolic heart failure, RLE cellulitis   Clinical Impression   Patient agreeable to OT evaluation. Patient presenting with decreased independence in self care, balance, functional mobility/transfers, endurance, and safety awareness. Pt with cognitive deficits at baseline (spoke with daughter to conform home set up/PLOF). PTA pt received assistance for bathing (1x/wk with PCA), LB dressing (has sock aid), and IADLs. Pt is normally Mod I for functional mobility using a rollator. Pt currently functioning at CGA-Min A +2 for BSC transfer using a RW, Max A for LB dressing, Max A for posterior hygiene, and Min A for UB dressing. Pt will benefit from acute OT to increase overall independence in the areas of ADLs and functional mobility in order to safely discharge to next venue of care. OT recommends ongoing therapy upon discharge to maximize safety and independence with ADLs, decrease fall risk, decrease caregiver burden, and promote return to PLOF.     Recommendations for follow up therapy are one component of a multi-disciplinary discharge planning process, led by the attending physician.  Recommendations may be updated based on patient status, additional functional criteria and insurance authorization.   Assistance Recommended at Discharge Frequent or constant Supervision/Assistance  Patient can return home with the following A lot of help with bathing/dressing/bathroom;A lot of help with walking and/or  transfers;Direct supervision/assist for financial management;Direct supervision/assist for medications management;Assistance with cooking/housework;Assist for transportation;Help with stairs or ramp for entrance    Functional Status Assessment  Patient has had a recent decline in their functional status and demonstrates the ability to make significant improvements in function in a reasonable and predictable amount of time.  Equipment Recommendations  Other (comment) (defer to next venue of care)    Recommendations for Other Services       Precautions / Restrictions Precautions Precautions: Fall Restrictions Weight Bearing Restrictions: No      Mobility Bed Mobility               General bed mobility comments: NT, pt received sitting at EOB with PT    Transfers Overall transfer level: Needs assistance Equipment used: Rolling walker (2 wheels) Transfers: Sit to/from Stand Sit to Stand: Min assist     Step pivot transfers: Min assist, Min guard, +2 safety/equipment (from bed>recliner then recliner<>BSC)     General transfer comment: VC for hand placement      Balance Overall balance assessment: Needs assistance Sitting-balance support: Feet supported Sitting balance-Leahy Scale: Good     Standing balance support: Bilateral upper extremity supported, Reliant on assistive device for balance, During functional activity Standing balance-Leahy Scale: Poor                             ADL either performed or assessed with clinical judgement   ADL Overall ADL's : Needs assistance/impaired                 Upper Body Dressing : Minimal assistance;Sitting Upper Body Dressing Details (indicate cue type and reason): to don/doff gown Lower Body Dressing: Maximal assistance;Sitting/lateral leans  Lower Body Dressing Details (indicate cue type and reason): socks Toilet Transfer: Min guard;Minimal assistance;BSC/3in1;Rolling walker (2 wheels);+2 for  safety/equipment   Toileting- Clothing Manipulation and Hygiene: Maximal assistance;Sit to/from stand Toileting - Clothing Manipulation Details (indicate cue type and reason): for posterior hygiene after incontinent BM, pt able to transfer to [facility] to finish toileting             Vision Patient Visual Report: No change from baseline       Perception     Praxis      Pertinent Vitals/Pain Pain Assessment Pain Assessment: No/denies pain     Hand Dominance Right   Extremity/Trunk Assessment Upper Extremity Assessment Upper Extremity Assessment: Generalized weakness   Lower Extremity Assessment Lower Extremity Assessment: Generalized weakness       Communication Communication Communication: No difficulties   Cognition Arousal/Alertness: Awake/alert Behavior During Therapy: Flat affect Overall Cognitive Status: History of cognitive impairments - at baseline Area of Impairment: Memory, Following commands, Safety/judgement, Awareness, Problem solving                     Memory: Decreased short-term memory, Decreased recall of precautions Following Commands: Follows one step commands consistently, Follows one step commands with increased time Safety/Judgement: Decreased awareness of safety, Decreased awareness of deficits Awareness: Emergent Problem Solving: Slow processing, Requires verbal cues General Comments: Pt provides inaccurate PLOF information (called daughter to confirm).     General Comments  BP at end of session: 101/70 (82), HR 113.    Exercises Other Exercises Other Exercises: OT provided education re: role of OT, OT POC, post acute recs, sitting up for all meals, EOB/OOB mobility with assistance, home/fall safety.      Shoulder Instructions      Home Living Family/patient expects to be discharged to:: Private residence Living Arrangements: Children (daughter, son in law, 2 grandchildren) Available Help at Discharge: Family;Available  PRN/intermittently (pt's son-in-law works 3rd shift & sleeps during the day, pt's daughter works outside of the home during the day) Type of Home: House Home Access: Stairs to enter [employer] of Steps: 1 partial step Entrance Stairs-Rails: None Home Layout: Able to live on main level with bedroom/bathroom     Bathroom Shower/Tub: Gaffer         Home Equipment: Air cabin crew (4 wheels);Wheelchair - manual;Grab bars - toilet;Grab bars - tub/shower;Adaptive equipment Adaptive Equipment: Sock aid Additional Comments: lives with daughter, son in law and grand children, receives meals on wheels & has a personal care aide 1x/week to assist with bathing.      Prior Functioning/Environment Prior Level of Function : Needs assist             Mobility Comments: Pt sleeps in lift chair. Prior to last hospital admission pt was ambulatory with rollator, ambulating household distances ADLs Comments: Has a personal care aide 1x/week that assists with bathing, requires some assistance for dressing but pt notes he has a sock aid. Pt uses briefs 2/2 bladder leakage but BM incontinence is new. Meals on wheels        OT Problem List: Decreased strength;Decreased activity tolerance;Decreased safety awareness;Impaired balance (sitting and/or standing);Decreased knowledge of use of DME or AE;Pain;Decreased cognition;Decreased range of motion;Cardiopulmonary status limiting activity      OT Treatment/Interventions: Self-care/ADL training;Therapeutic activities;Energy conservation;DME and/or AE instruction;Balance training;Patient/family education;Neuromuscular education;Manual therapy;Modalities;Splinting;Cognitive remediation/compensation;Visual/perceptual remediation/compensation;Therapeutic exercise    OT Goals(Current goals can be found in the care plan section) Acute Rehab OT Goals Patient Stated Goal: to  go home OT Goal Formulation: With patient Time For Goal  Achievement: 09/07/22 Potential to Achieve Goals: Fair   OT Frequency: Min 2X/week    Co-evaluation PT/OT/SLP Co-Evaluation/Treatment: Yes Reason for Co-Treatment: Other (comment) (pt fatigued) PT goals addressed during session: Mobility/safety with mobility;Balance;Proper use of DME OT goals addressed during session: ADL's and self-care;Proper use of Adaptive equipment and DME      AM-PAC OT "6 Clicks" Daily Activity     Outcome Measure Help from another person eating meals?: None Help from another person taking care of personal grooming?: A Little Help from another person toileting, which includes using toliet, bedpan, or urinal?: A Lot Help from another person bathing (including washing, rinsing, drying)?: A Lot Help from another person to put on and taking off regular upper body clothing?: A Little Help from another person to put on and taking off regular lower body clothing?: A Lot 6 Click Score: 16   End of Session Equipment Utilized During Treatment: Gait belt;Rolling walker (2 wheels) Nurse Communication: Mobility status;Other (comment) (BM on BSC, foley catheter leaking/dripping)  Activity Tolerance: Patient tolerated treatment well;Patient limited by fatigue Patient left: in chair;with call bell/phone within reach;with chair alarm set  OT Visit Diagnosis: Muscle weakness (generalized) (M62.81);Unsteadiness on feet (R26.81)                Time: [id number] OT Time Calculation (min): 21 min Charges:  OT General Charges $OT Visit: 1 Visit OT Evaluation $OT Eval Low Complexity: 1 Low  [facility] MS, OTR/L ascom [phone]  08/24/22, 3:11 PM

## 2022-08-24 NOTE — Assessment & Plan Note (Signed)
Patient with history of recurrent UTI and failure to thrive at home.  Unable to take care of himself and appears dehydrated. PT recommended SNF during most recent hospitalization but he declined at that time and decided to go home with home health. Patient would like to proceed with rehab placement at this time. -PT/OT evaluation

## 2022-08-24 NOTE — Assessment & Plan Note (Addendum)
Being managed by his urologist at [facility].  History of recent procedure and a chronic indwelling catheter is in place. -Continue with catheter care -Continue home Myrbetriq and finasteride -Continue with outpatient urology follow-up

## 2022-08-25 DIAGNOSIS — I10 Essential (primary) hypertension: Secondary | ICD-10-CM | POA: Diagnosis not present

## 2022-08-25 DIAGNOSIS — N401 Enlarged prostate with lower urinary tract symptoms: Secondary | ICD-10-CM | POA: Diagnosis not present

## 2022-08-25 DIAGNOSIS — Z8619 Personal history of other infectious and parasitic diseases: Secondary | ICD-10-CM | POA: Diagnosis not present

## 2022-08-25 DIAGNOSIS — R531 Weakness: Secondary | ICD-10-CM | POA: Diagnosis not present

## 2022-08-25 LAB — BASIC METABOLIC PANEL
Anion gap: 7 (ref 5–15)
BUN: 19 mg/dL (ref 8–23)
CO2: 24 mmol/L (ref 22–32)
Calcium: 8.4 mg/dL — ABNORMAL LOW (ref 8.9–10.3)
Chloride: 103 mmol/L (ref 98–111)
Creatinine, Ser: 1.16 mg/dL (ref 0.61–1.24)
GFR, Estimated: >60 mL/min (ref >60)
Glucose, Bld: 90 mg/dL (ref 70–99)
Potassium: 3.3 mmol/L — ABNORMAL LOW (ref 3.5–5.1)
Sodium: 134 mmol/L — ABNORMAL LOW (ref 135–145)

## 2022-08-25 MED ORDER — POTASSIUM CHLORIDE CRYS ER 20 MEQ PO TBCR
40.0000 meq | EXTENDED_RELEASE_TABLET | Freq: Once | ORAL | Status: AC
  Administered 2022-08-25: 40 meq via ORAL
  Filled 2022-08-25: qty 2

## 2022-08-25 NOTE — Progress Notes (Addendum)
Mobility Specialist - Progress Note   08/25/22 1500  Mobility  Activity Ambulated with assistance in hallway  Level of Assistance Standby assist, set-up cues, supervision of patient - no hands on  Assistive Device Front wheel walker  Distance Ambulated (ft) 40 ft  Activity Response Tolerated well  $Mobility charge 1 Mobility     Pt sitting in recliner upon arrival, utilizing RA. Pt emotional and teary-eyed throughout session d/t the thought of "having to spend birthday in a facility"---active listening and support provided. Pt completed 4 STS in session with minA-minG depending on level of fatigue. Pt ambulated in hallway with minG. No LOB. VC to increase step-length with good follow through. Appears to perform better when given distance goals or stopping points to achieve. Noted BUE shaky x2 this date when coming into standing but unsure if its d/t pt being emotional or something else. Further activity limited d/t continent BM during ambulation. Pt wheeled back to room; linens cleaned, gown changed and peri-care performed. Pt left in recliner with alarm set, needs in reach. RN notified.    Kathee Delton Mobility Specialist 08/25/22, 4:06 PM

## 2022-08-25 NOTE — Assessment & Plan Note (Signed)
Patient with history of recurrent UTIs with chronic indwelling catheter in place.  UA still looked dirty but no leukocytosis or fever.  No urinary symptoms except chronic dribbling around Foley catheter.  He was placed on cefepime based on most recent urine culture. -Discontinue antibiotics as blood cultures remain negative.

## 2022-08-25 NOTE — TOC Initial Note (Signed)
Transition of Care (TOC) - Initial/Assessment Note    Patient Details  Name: Adam Rios. MRN: [id number] Date of Birth: [birth year]  Transition of Care [facility]) CM/SW Contact:    Candie Chroman, LCSW Phone Number: 08/25/2022, 3:45 PM  Clinical Narrative:  CSW met with patient. No supports at bedside. CSW introduced role and explained that therapy recommendations would be discussed. Patient is agreeable to SNF placement but became tearful because his grandchildren were supposed to throw him a birthday party. Gave CMS scores for facilities within 25 miles of his zip code. No further concerns. CSW encouraged patient to contact CSW as needed. CSW will continue to follow patient for support and facilitate discharge to SNF once medically stable.                Expected Discharge Plan: Skilled Nursing Facility Barriers to Discharge: Continued Medical Work up   Patient Goals and CMS Choice            Expected Discharge Plan and Services     Post Acute Care Choice: [place] Living arrangements for the past 2 months: Single Family Home                                      Prior Living Arrangements/Services Living arrangements for the past 2 months: Single Family Home Lives with:: Adult Children, Relatives Patient language and need for interpreter reviewed:: Yes Do you feel safe going back to the place where you live?: Yes      Need for Family Participation in Patient Care: Yes (Comment) Care giver support system in place?: Yes (comment) Current home services: Homehealth aide, Home OT, Home PT, Home RN Criminal Activity/Legal Involvement Pertinent to Current Situation/Hospitalization: No - Comment as needed  Activities of Daily Living Home Assistive Devices/Equipment: Environmental consultant (specify type), Other (Comment) (chronic foley) ADL Screening (condition at time of admission) Patient's cognitive ability adequate to safely complete daily activities?: Yes Is the  patient deaf or have difficulty hearing?: No Does the patient have difficulty seeing, even when wearing glasses/contacts?: No Does the patient have difficulty concentrating, remembering, or making decisions?: Yes Patient able to express need for assistance with ADLs?: Yes Does the patient have difficulty dressing or bathing?: Yes Independently performs ADLs?: No Communication: Independent Dressing (OT): Needs assistance Is this a change from baseline?: Pre-admission baseline Grooming: Needs assistance Is this a change from baseline?: Pre-admission baseline Feeding: Independent Bathing: Needs assistance Is this a change from baseline?: Pre-admission baseline Toileting: Needs assistance Is this a change from baseline?: Pre-admission baseline In/Out Bed: Needs assistance Is this a change from baseline?: Pre-admission baseline Walks in Home: Needs assistance Is this a change from baseline?: Pre-admission baseline Does the patient have difficulty walking or climbing stairs?: Yes Weakness of Legs: Both Weakness of Arms/Hands: None  Permission Sought/Granted Permission sought to share information with : Chartered certified accountant granted to share information with : Yes, Verbal Permission Granted     Permission granted to share info w AGENCY: SNF's        Emotional Assessment Appearance:: Appears stated age Attitude/Demeanor/Rapport: Engaged Affect (typically observed): Calm, Tearful/Crying Orientation: : Oriented to Self, Oriented to Place, Oriented to  Time, Oriented to Situation Alcohol / Substance Use: Not Applicable Psych Involvement: No (comment)  Admission diagnosis:  Confusion [R41.0] Generalized weakness [R53.1] AKI (acute kidney injury) ([name]) [N17.9] Urinary tract infection without hematuria, site unspecified [  N39.0] Patient Active Problem List   Diagnosis Date Noted   Generalized weakness 08/23/2022   Hypokalemia 08/14/2022   AKI (acute kidney  injury) ([name]) 08/09/2022   Leukocytosis 08/09/2022   Severe sepsis ([name]) 08/09/2022   Heart failure with preserved ejection fraction ([name]) 08/09/2022   Acute hemorrhagic cystitis 06/11/2022   Iron deficiency anemia due to chronic blood loss 06/11/2022   Dyslipidemia 06/11/2022   GERD without esophagitis 06/11/2022   Lower extremity cellulitis 06/11/2022   Heart block AV complete ([name]) 05/03/2022   Second degree heart block 05/02/2022   Acute on chronic congestive heart failure ([name]) 04/30/2022   Acute on chronic diastolic CHF (congestive heart failure) ([name]) 04/30/2022   Obesity (BMI 30-39.9) 04/30/2022   Chronic indwelling Foley catheter 04/30/2022   Septic shock ([name]) 02/11/2022   Pressure injury of skin 02/11/2022   Bacteremia 11/22/2021   Hematuria 11/21/2021   Renal cell carcinoma s/p nephrectomy, left 11/21/2021   History of recurrent UTI and ESBL UTI  11/21/2021   BPH with obstruction/lower urinary tract symptoms [id number]   Complicated UTI (urinary tract infection) 09/09/2021   Neuropathy 09/09/2021   History of CVA (cerebrovascular accident) 09/09/2021   Fall 05/14/2021   Frailty 03/04/2018   Acute encephalopathy 01/30/2018   GERD (gastroesophageal reflux disease) 04/05/2017   Pain 04/05/2017   Weakness 04/05/2017   Recurrent UTI 01/20/2016   Essential hypertension 07/31/2014   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 04/10/2014   Ventral hernia without obstruction or gangrene 04/10/2014   Acute low back pain without sciatica 12/26/2013   Fusion of spine of lumbar region 12/26/2013   Anasarca 09/26/2013   Hemorrhoid 09/26/2013   Neurogenic bladder 09/26/2013   Renal cell cancer ([place]) 09/26/2013   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   Lower urinary tract infectious disease 10/28/2012   PCP:  Dola Argyle, MD Pharmacy:   [facility] [phone] - [address], [place] AT [facility] OF [address] [place] [address] Phone: [phone]  Fax: [phone]     Social Determinants of Health (SDOH) Social History: SDOH Screenings   Food Insecurity: No Food Insecurity (08/24/2022)  Housing: Low Risk  (08/24/2022)  Transportation Needs: No Transportation Needs (08/24/2022)  Utilities: Not At Risk (08/24/2022)  Tobacco Use: Medium Risk (08/23/2022)   SDOH Interventions:     Readmission Risk Interventions    08/11/2022    4:53 PM 06/11/2022    2:19 PM 02/12/2022    2:17 PM  Readmission Risk Prevention Plan  Transportation Screening Complete Complete Complete  PCP or Specialist Appt within 3-5 Days  Complete Complete  HRI or Home Care Consult  Complete Complete  Social Work Consult for [place] Planning/Counseling  Complete Complete  Palliative Care Screening  Not Applicable Not Applicable  Medication Review Press photographer) Complete Complete Referral to Pharmacy  [facility] or [place] Complete    Skilled Nursing Facility Complete

## 2022-08-25 NOTE — Progress Notes (Signed)
Progress Note   Patient: Adam Rios. [id number] DOB: 1940-11-24 DOA: 08/23/2022     2 DOS: the patient was seen and examined on 08/25/2022   Brief hospital course: Taken from H&P.  Adam Rios. is a 82 y.o. male with medical history significant of recurrent UTIs, cognitive impairment, history of CVA, essential hypertension, renal cell carcinoma, neurogenic bladder, chronic indwelling Foley catheter, some dementia, insomnia, chronic diastolic heart failure and history of right lower extremity cellulitis who was just recently admitted and discharged from the hospital with complex UTI.  Patient was discharged home and is now back with similar complaints.  During her last admission he did very well prior to discharge.  But he is now weak unable to function.  Back with significant hypokalemia, hyponatremia as well as AKI.  He has had poor oral intake.  Urinalysis also showed very dirty urine.  Has evidence of recurrent UTI again.   Our physical therapist recommended going to rehab during most recent hospitalization but patient refused and made that poor choice, he was sent home with home health but he does not have any other help at home to make to his appointments.  During this most recent hospitalization he was also found to have urine leaking around the Foley catheter, urology was consulted and it does appear to be a chronic issue for which he is using depends at home.  They were recommending follow-up with his own urologist at [facility].    3/25: Blood pressure borderline soft at 94/59, rest of vitals stable.  Improving sodium and potassium, procalcitonin negative.  Lactic acidosis has been resolved-most likely secondary to poor p.o. intake and dehydration.  UA does look dirty but most likely colonization due to chronic indwelling catheter.  Currently on cefepime-if blood cultures remain negative in 24 hours then we can discontinue antibiotics as he recently received adequate treatment for  UTI. Pending PT/OT evaluation.  TOC was consulted for placement based on recent recommendations.  3/26: Vitals stable, labs with mild but improving hypokalemia.  Blood cultures remain negative.  We will discontinue antibiotics.  C. difficile PCR was negative.  BNP slightly elevated at 193.  Discontinuing IV fluid.  Assessment and Plan: * Generalized weakness Patient with history of recurrent UTI and failure to thrive at home.  Unable to take care of himself and appears dehydrated. PT recommended SNF during most recent hospitalization but he declined at that time and decided to go home with home health. Patient would like to proceed with rehab placement at this time. -PT/OT evaluation  History of recurrent UTI and ESBL UTI  Patient with history of recurrent UTIs with chronic indwelling catheter in place.  UA still looked dirty but no leukocytosis or fever.  No urinary symptoms except chronic dribbling around Foley catheter.  He was placed on cefepime based on most recent urine culture. -Discontinue antibiotics as blood cultures remain negative.  Renal cell carcinoma s/p nephrectomy, left Apparently in remission. -Continue outpatient follow-up  BPH with obstruction/lower urinary tract symptoms Being managed by his urologist at [facility].  History of recent procedure and a chronic indwelling catheter is in place. -Continue with catheter care -Continue home Myrbetriq and finasteride -Continue with outpatient urology follow-up  Essential hypertension Blood pressure with an lower normal limit. Mild intermittent tachycardia. -Continue with home metoprolol if systolic above [id number]  Hypokalemia Most likely secondary to torsemide use.  Magnesium at 1.8 -Replace potassium and monitor  History of CVA (cerebrovascular accident) -Continue statin  GERD (gastroesophageal  reflux disease) -Continue PPI  Heart failure with preserved ejection fraction (HCC) Clinically appears dry.  Holding home  torsemide as patient is being given gentle IV fluid. -Check BNP  Dyslipidemia -Continue statin   Subjective: Patient was seen and examined today.  No new concerns.  Physical Exam: Vitals:   08/24/22 1525 08/24/22 1942 08/25/22 0430 08/25/22 0813  BP: 113/75 123/76 118/74 109/66  Pulse: (!) 108 100 97 70  Resp: 18 18 18 18   Temp: 97.8 F (36.6 C) 98 F (36.7 C) 98 F (36.7 C) 97.9 F (36.6 C)  TempSrc:  Oral Oral   SpO2: 100% 96% 98% 98%  Weight:      Height:       General.  Frail elderly man, in no acute distress. Pulmonary.  Lungs clear bilaterally, normal respiratory effort. CV.  Regular rate and rhythm, no JVD, rub or murmur. Abdomen.  Soft, nontender, nondistended, BS positive. CNS.  Alert and oriented .  No focal neurologic deficit. Extremities.  No edema, no cyanosis, pulses intact and symmetrical. Psychiatry.  Appears to have some cognitive impairment.   Data Reviewed: Prior data reviewed  Family Communication: Discussed with daughter on phone  Disposition: Status is: Inpatient Remains inpatient appropriate because: Severity of illness  Planned Discharge Destination: Skilled nursing facility  Time spent: 40 minutes  This record has been created using Systems analyst. Errors have been sought and corrected,but may not always be located. Such creation errors do not reflect on the standard of care.   Author: Lorella Nimrod, MD 08/25/2022 2:27 PM  For on call review [email or web address].

## 2022-08-25 NOTE — NC FL2 (Signed)
[place] LEVEL OF CARE FORM     IDENTIFICATION  Patient Name: Adam Rios. Birthdate: May 29, 1941 Sex: male Admission Date (Current Location): 08/23/2022  [facility] and [place] Number:  Engineering geologist and Address:  [facility], [address] [place], [place] [address]      Provider Number: [id number]  Attending Physician Name and Address:  Lorella Nimrod, MD  Relative Name and Phone Number:       Current Level of Care: Hospital Recommended Level of Care: [place] Prior Approval Number:    Date Approved/Denied:   PASRR Number: [id number]  Discharge Plan: SNF    Current Diagnoses: Patient Active Problem List   Diagnosis Date Noted   Generalized weakness 08/23/2022   Hypokalemia 08/14/2022   AKI (acute kidney injury) ([place]) 08/09/2022   Leukocytosis 08/09/2022   Severe sepsis ([place]) 08/09/2022   Heart failure with preserved ejection fraction ([place]) 08/09/2022   Acute hemorrhagic cystitis 06/11/2022   Iron deficiency anemia due to chronic blood loss 06/11/2022   Dyslipidemia 06/11/2022   GERD without esophagitis 06/11/2022   Lower extremity cellulitis 06/11/2022   Heart block AV complete ([place]) 05/03/2022   Second degree heart block 05/02/2022   Acute on chronic congestive heart failure ([place]) 04/30/2022   Acute on chronic diastolic CHF (congestive heart failure) ([place]) 04/30/2022   Obesity (BMI 30-39.9) 04/30/2022   Chronic indwelling Foley catheter 04/30/2022   Septic shock ([place]) 02/11/2022   Pressure injury of skin 02/11/2022   Bacteremia 11/22/2021   Hematuria 11/21/2021   Renal cell carcinoma s/p nephrectomy, left 11/21/2021   History of recurrent UTI and ESBL UTI  11/21/2021   BPH with obstruction/lower urinary tract symptoms [id number]   Complicated UTI (urinary tract infection) 09/09/2021   Neuropathy 09/09/2021   History of CVA (cerebrovascular accident) 09/09/2021   Fall 05/14/2021   Frailty  03/04/2018   Acute encephalopathy 01/30/2018   GERD (gastroesophageal reflux disease) 04/05/2017   Pain 04/05/2017   Weakness 04/05/2017   Recurrent UTI 01/20/2016   Essential hypertension 07/31/2014   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 04/10/2014   Ventral hernia without obstruction or gangrene 04/10/2014   Acute low back pain without sciatica 12/26/2013   Fusion of spine of lumbar region 12/26/2013   Anasarca 09/26/2013   Hemorrhoid 09/26/2013   Neurogenic bladder 09/26/2013   Renal cell cancer ([place]) 09/26/2013   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 MASD on right and left buttocks: Foam every 3 days.)                       Personal Care Assistance Level of Assistance  Bathing, Feeding, Dressing Bathing Assistance: Maximum assistance Feeding assistance: Limited assistance Dressing Assistance: Maximum assistance     Functional Limitations Info  Sight, Hearing, Speech Sight Info: Adequate Hearing Info: Adequate Speech Info: Adequate    SPECIAL CARE FACTORS FREQUENCY  PT (By licensed PT), OT (By licensed OT)     PT Frequency: 5 x week OT Frequency: 5 x week  Contractures Contractures Info: Not present    Additional Factors Info  Code Status, Allergies Code Status Info: Full code Allergies Info: Empagliflozin, Fentanyl, Oxycodone     Isolation Precautions Info: Contact precautions: ESBL, MRSA, MDR Bacteria     Current Medications (08/25/2022):  This is the current hospital active  medication list Current Facility-Administered Medications  Medication Dose Route Frequency Provider Last Rate Last Admin   acetaminophen (TYLENOL) tablet 650 mg  650 mg Oral Q6H PRN Elwyn Reach, MD       Or   acetaminophen (TYLENOL) suppository 650 mg  650 mg Rectal Q6H PRN Elwyn Reach, MD       atorvastatin (LIPITOR) tablet 40 mg  40 mg Oral Daily Lorella Nimrod, MD   40 mg at 08/25/22 0926   Chlorhexidine Gluconate Cloth 2 % PADS 6 each  6 each Topical Q0600 Lorella Nimrod, MD   6 each at 08/25/22 [id number]   cyanocobalamin (VITAMIN B12) tablet 1,000 mcg  1,000 mcg Oral Daily Lorella Nimrod, MD   1,000 mcg at 08/25/22 0927   donepezil (ARICEPT) tablet 10 mg  10 mg Oral QHS Amin, Soundra Pilon, MD   10 mg at 08/24/22 2200   enoxaparin (LOVENOX) injection 50 mg  0.5 mg/kg Subcutaneous Q24H Gala Romney L, MD   50 mg at 08/25/22 0926   finasteride (PROSCAR) tablet 5 mg  5 mg Oral Daily Gala Romney L, MD   5 mg at 08/25/22 [id number]   gabapentin (NEURONTIN) capsule 300 mg  300 mg Oral QHS Jonelle Sidle, Mohammad L, MD   300 mg at 08/24/22 2200   metoprolol succinate (TOPROL-XL) 24 hr tablet 25 mg  25 mg Oral Daily Lorella Nimrod, MD   25 mg at 08/25/22 0927   mirabegron ER (MYRBETRIQ) tablet 25 mg  25 mg Oral Daily Gala Romney L, MD   25 mg at 08/25/22 0926   ondansetron (ZOFRAN) tablet 4 mg  4 mg Oral Q6H PRN Elwyn Reach, MD       Or   ondansetron (ZOFRAN) injection 4 mg  4 mg Intravenous Q6H PRN Elwyn Reach, MD       Oral care mouth rinse  15 mL Mouth Rinse PRN Elwyn Reach, MD       pantoprazole (PROTONIX) EC tablet 40 mg  40 mg Oral Daily Lorella Nimrod, MD   40 mg at 08/25/22 [id number]   senna-docusate (Senokot-S) tablet 1 tablet  1 tablet Oral BID PRN Elwyn Reach, MD         Discharge Medications: Please see discharge summary for a list of discharge medications.  Relevant Imaging Results:  Relevant Lab Results:   Additional Information SS#: [id number]  Candie Chroman, LCSW

## 2022-08-26 DIAGNOSIS — R531 Weakness: Secondary | ICD-10-CM | POA: Diagnosis not present

## 2022-08-26 LAB — BASIC METABOLIC PANEL
Anion gap: 8 (ref 5–15)
BUN: 14 mg/dL (ref 8–23)
CO2: 24 mmol/L (ref 22–32)
Calcium: 8.9 mg/dL (ref 8.9–10.3)
Chloride: 105 mmol/L (ref 98–111)
Creatinine, Ser: 1.12 mg/dL (ref 0.61–1.24)
GFR, Estimated: >60 mL/min (ref >60)
Glucose, Bld: 130 mg/dL — ABNORMAL HIGH (ref 70–99)
Potassium: 3.5 mmol/L (ref 3.5–5.1)
Sodium: 137 mmol/L (ref 135–145)

## 2022-08-26 LAB — MAGNESIUM: Magnesium: 1.7 mg/dL (ref 1.7–2.4)

## 2022-08-26 MED ORDER — TORSEMIDE 20 MG PO TABS: 20.0000 mg | ORAL_TABLET | Freq: Two times a day (BID) | ORAL | Status: DC

## 2022-08-26 MED ORDER — ASPIRIN 81 MG PO TBEC
81.0000 mg | DELAYED_RELEASE_TABLET | Freq: Every day | ORAL | Status: DC
  Administered 2022-08-27 – 2022-08-28 (×2): 81 mg via ORAL
  Filled 2022-08-26 (×2): qty 1

## 2022-08-26 MED ORDER — POTASSIUM CHLORIDE CRYS ER 20 MEQ PO TBCR
20.0000 meq | EXTENDED_RELEASE_TABLET | Freq: Every day | ORAL | Status: DC
  Administered 2022-08-26 – 2022-08-28 (×3): 20 meq via ORAL
  Filled 2022-08-26 (×3): qty 1

## 2022-08-26 MED ORDER — TORSEMIDE 20 MG PO TABS
20.0000 mg | ORAL_TABLET | Freq: Every day | ORAL | Status: DC
  Administered 2022-08-26 – 2022-08-28 (×3): 20 mg via ORAL
  Filled 2022-08-26 (×3): qty 1

## 2022-08-26 NOTE — Progress Notes (Signed)
Physical Therapy Treatment Patient Details Name: Adam Rios. MRN: [id number] DOB: Aug 20, 1940 Today's Date: 08/26/2022   History of Present Illness Pt is an 82 y/o M admitted on 08/23/22 after presenting with c/o weakness. Pt found to have hypokalemia, hyponatremia, & AKI. Pt is being treated for recurrent complex UTI & generalized weakness. PMH: recurrent UTIs, cognitive impairment, CVA, HTN, renal cell carcinoma, neurogenic bladder, chronic indwelling Foley catheter, dementia, insomnia, chronic diastolic heart failure, RLE cellulitis    PT Comments    Pt was pleasant and motivated to participate during the session and put forth good effort throughout. Pt required cuing for sequencing and min A with bed mobility tasks and close CGA during sit to stand from an elevated EOB.  Pt ambulated with a very slow, effortful cadence with occasional shuffling steps with cues for upright posture and amb closer to the RW, no overt LOB but pt remains at an elevated risk for falls.  Pt will benefit from continued PT services upon discharge to safely address deficits listed in patient problem list for decreased caregiver assistance and eventual return to PLOF.     Recommendations for follow up therapy are one component of a multi-disciplinary discharge planning process, led by the attending physician.  Recommendations may be updated based on patient status, additional functional criteria and insurance authorization.  Follow Up Recommendations  Can patient physically be transported by private vehicle: Yes    Assistance Recommended at Discharge Frequent or constant Supervision/Assistance  Patient can return home with the following A little help with bathing/dressing/bathroom;Assistance with cooking/housework;Assist for transportation;Help with stairs or ramp for entrance;A little help with walking and/or transfers   Equipment Recommendations  Other (comment) (TBD at next venue of care)    Recommendations  for Other Services       Precautions / Restrictions Precautions Precautions: Fall Restrictions Weight Bearing Restrictions: No     Mobility  Bed Mobility Overal bed mobility: Needs Assistance Bed Mobility: Supine to Sit     Supine to sit: Min assist     General bed mobility comments: Min A for BLE and trunk control    Transfers Overall transfer level: Needs assistance Equipment used: Rolling walker (2 wheels) Transfers: Sit to/from Stand Sit to Stand: Min guard, From elevated surface           General transfer comment: Mod VC for hand placement and increased trunk flexion    Ambulation/Gait Ambulation/Gait assistance: Min guard Gait Distance (Feet): 12 Feet Assistive device: Rolling walker (2 wheels) Gait Pattern/deviations: Step-through pattern, Decreased step length - right, Decreased step length - left, Trunk flexed, Shuffle Gait velocity: decreased     General Gait Details: Very slow, effortful cadence with occasional shuffling steps with cues for upright posture and amb closer to the RW, no overt LOB   Stairs             Wheelchair Mobility    Modified Rankin (Stroke Patients Only)       Balance Overall balance assessment: Needs assistance   Sitting balance-Leahy Scale: Good     Standing balance support: Bilateral upper extremity supported, Reliant on assistive device for balance, During functional activity Standing balance-Leahy Scale: Fair                              Cognition Arousal/Alertness: Awake/alert Behavior During Therapy: WFL for tasks assessed/performed Overall Cognitive Status: History of cognitive impairments - at baseline  Exercises Total Joint Exercises Ankle Circles/Pumps: AROM, Strengthening, Both, 10 reps Quad Sets: Strengthening, Both, 10 reps Gluteal Sets: Strengthening, Both, 10 reps Long Arc Quad: Strengthening, Both, 10 reps Knee  Flexion: Strengthening, Both, 10 reps Other Exercises Other Exercises: HEP education for BLE APs, QS, and GS    General Comments        Pertinent Vitals/Pain Pain Assessment Pain Assessment: No/denies pain    Home Living                          Prior Function            PT Goals (current goals can now be found in the care plan section) Progress towards PT goals: Progressing toward goals    Frequency    Min 2X/week      PT Plan Current plan remains appropriate    Co-evaluation              AM-PAC PT "6 Clicks" Mobility   Outcome Measure  Help needed turning from your back to your side while in a flat bed without using bedrails?: A Little Help needed moving from lying on your back to sitting on the side of a flat bed without using bedrails?: A Little Help needed moving to and from a bed to a chair (including a wheelchair)?: A Little Help needed standing up from a chair using your arms (e.g., wheelchair or bedside chair)?: A Little Help needed to walk in hospital room?: A Little Help needed climbing 3-5 steps with a railing? : A Lot 6 Click Score: 17    End of Session Equipment Utilized During Treatment: Gait belt Activity Tolerance: Patient tolerated treatment well Patient left: in chair;with chair alarm set;with call bell/phone within reach Nurse Communication: Mobility status PT Visit Diagnosis: Muscle weakness (generalized) (M62.81);Difficulty in walking, not elsewhere classified (R26.2);Unsteadiness on feet (R26.81)     Time: [id number] PT Time Calculation (min) (ACUTE ONLY): 23 min  Charges:  $Gait Training: 8-22 mins $Therapeutic Exercise: 8-22 mins                     D. Scott Adrain Nesbit PT, DPT 08/26/22, 10:38 AM

## 2022-08-26 NOTE — Progress Notes (Signed)
Occupational Therapy Treatment Patient Details Name: Adam Rios. MRN: [id number] DOB: [birth year] Today's Date: 08/26/2022   History of present illness Pt is an 82 y/o M admitted on 08/23/22 after presenting with c/o weakness. Pt found to have hypokalemia, hyponatremia, & AKI. Pt is being treated for recurrent complex UTI & generalized weakness. PMH: recurrent UTIs, cognitive impairment, CVA, HTN, renal cell carcinoma, neurogenic bladder, chronic indwelling Foley catheter, dementia, insomnia, chronic diastolic heart failure, RLE cellulitis   OT comments  Patient received sitting in recliner and agreeable to OT. Tx session targeted improving tolerance for functional mobility in the setting of ADL tasks. Pt agreeable to complete functional mobility to bedside sink in order to engage in grooming tasks. Pt attempted STS 2x from recliner, however, unsuccessful despite Max A. Pt functionally limited by generalized weakness and fatigue this date. He endorsed SOB with activity, VSS. Pt then engaged in seated grooming tasks with set up-supervision and seated BUE/LE exercises (see details below). Pt left as received with all needs in reach. Pt is making progress toward goal completion. D/C recommendation remains appropriate. OT will continue to follow acutely.    Recommendations for follow up therapy are one component of a multi-disciplinary discharge planning process, led by the attending physician.  Recommendations may be updated based on patient status, additional functional criteria and insurance authorization.    Assistance Recommended at Discharge Frequent or constant Supervision/Assistance  Patient can return home with the following  A lot of help with bathing/dressing/bathroom;A lot of help with walking and/or transfers;Direct supervision/assist for financial management;Direct supervision/assist for medications management;Assistance with cooking/housework;Assist for transportation;Help with stairs or  ramp for entrance   Equipment Recommendations  Other (comment) (defer to next venue of care)    Recommendations for Other Services      Precautions / Restrictions Precautions Precautions: Fall Restrictions Weight Bearing Restrictions: No       Mobility Bed Mobility Overal bed mobility: Needs Assistance             General bed mobility comments: pt received/left in recliner    Transfers Overall transfer level: Needs assistance Equipment used: Rolling walker (2 wheels) Transfers: Sit to/from Stand             General transfer comment: Attempting STS 2x from recliner, however, unsuccessful despite Max A. Mulitmodal cues required for hand placement/safe technique     Balance Overall balance assessment: Needs assistance Sitting-balance support: Feet supported Sitting balance-Leahy Scale: Good         Standing balance comment: unable to acheive fully upright posture this date 2/2 weakness and fatigue           ADL either performed or assessed with clinical judgement   ADL Overall ADL's : Needs assistance/impaired     Grooming: Set up;Supervision/safety;Sitting;Wash/dry face       General ADL Comments: Pt functionally limited by generalized weakness, decreased endurance, and fatigue this date.    Extremity/Trunk Assessment Upper Extremity Assessment Upper Extremity Assessment: Generalized weakness   Lower Extremity Assessment Lower Extremity Assessment: Generalized weakness        Vision Patient Visual Report: No change from baseline     Perception     Praxis      Cognition Arousal/Alertness: Awake/alert Behavior During Therapy: WFL for tasks assessed/performed Overall Cognitive Status: History of cognitive impairments - at baseline       General Comments: followed commands well, pt reported he has been reviewing list of possible STR locations given to him by CSW  Exercises General Exercises - Upper Extremity Shoulder  Flexion: AROM, Both, 10 reps, Seated Shoulder Horizontal ABduction: AROM, Both, 10 reps, Seated Shoulder Horizontal ADduction: AROM, Both, 10 reps, Seated Elbow Flexion: AROM, Both, 10 reps, Seated Elbow Extension: AROM, Both, 10 reps, Seated General Exercises - Lower Extremity Hip ABduction/ADduction: AROM, Both, 10 reps, Seated Hip Flexion/Marching: AROM, Both, 10 reps, Seated Toe Raises: AROM, Both, 10 reps, Seated Heel Raises: AROM, Both, 10 reps, Seated    Shoulder Instructions       General Comments Pt endorsed SOB with activity    Pertinent Vitals/ Pain       Pain Assessment Pain Assessment: Faces Faces Pain Scale: Hurts a little bit Pain Location: B feet (neuropathy) Pain Descriptors / Indicators: Burning Pain Intervention(s): Limited activity within patient's tolerance, Monitored during session, Repositioned  Home Living            Prior Functioning/Environment              Frequency  Min 2X/week        Progress Toward Goals  OT Goals(current goals can now be found in the care plan section)  Progress towards OT goals: Progressing toward goals  Acute Rehab OT Goals Patient Stated Goal: to go home OT Goal Formulation: With patient Time For Goal Achievement: 09/07/22 Potential to Achieve Goals: [place] Discharge plan remains appropriate;Frequency remains appropriate    Co-evaluation                 AM-PAC OT "6 Clicks" Daily Activity     Outcome Measure   Help from another person eating meals?: None Help from another person taking care of personal grooming?: A Little Help from another person toileting, which includes using toliet, bedpan, or urinal?: A Lot Help from another person bathing (including washing, rinsing, drying)?: A Lot Help from another person to put on and taking off regular upper body clothing?: A Little Help from another person to put on and taking off regular lower body clothing?: A Lot 6 Click Score: 16    End  of Session Equipment Utilized During Treatment: Gait belt;Rolling walker (2 wheels)  OT Visit Diagnosis: Muscle weakness (generalized) (M62.81);Unsteadiness on feet (R26.81)   Activity Tolerance Patient limited by fatigue   Patient Left in chair;with call bell/phone within reach;with chair alarm set   Nurse Communication Mobility status        Time: 1020-1036 OT Time Calculation (min): 16 min  Charges: OT General Charges $OT Visit: 1 Visit OT Treatments $Self Care/Home Management : 8-22 mins  [facility] MS, OTR/L ascom [phone]  08/26/22, 1:24 PM

## 2022-08-26 NOTE — Progress Notes (Signed)
Progress Note   Patient: Adam Rios. [id number] DOB: April 13, 1941 DOA: 08/23/2022     3 DOS: the patient was seen and examined on 08/26/2022   Brief hospital course: Jaco Treas. is a 82 y.o. male with medical history significant of recurrent UTIs, cognitive impairment, history of CVA, essential hypertension, renal cell carcinoma, neurogenic bladder, chronic indwelling Foley catheter, some dementia, insomnia, chronic diastolic heart failure and history of right lower extremity cellulitis who was just recently admitted and discharged from the hospital with complex UTI.  Patient was discharged home and is now back with similar complaints.  During her last admission he did very well prior to discharge.  But he is now weak unable to function.  Back with significant hypokalemia, hyponatremia as well as AKI.  He has had poor oral intake.  Urinalysis also showed very dirty urine.  Has evidence of recurrent UTI again.    Our physical therapist recommended going to rehab during most recent hospitalization but patient refused and made that poor choice, he was sent home with home health but he does not have any other help at home to make to his appointments.   During this most recent hospitalization he was also found to have urine leaking around the Foley catheter, urology was consulted and it does appear to be a chronic issue for which he is using depends at home.  They were recommending follow-up with his own urologist at [facility].    3/25: Blood pressure borderline soft at 94/59, rest of vitals stable.  Improving sodium and potassium, procalcitonin negative.  Lactic acidosis has been resolved-most likely secondary to poor p.o. intake and dehydration.  UA does look dirty but most likely colonization due to chronic indwelling catheter.  Currently on cefepime-if blood cultures remain negative in 24 hours then we can discontinue antibiotics as he recently received adequate treatment for UTI. Pending PT/OT  evaluation.  TOC was consulted for placement based on recent recommendations.   3/26: Vitals stable, labs with mild but improving hypokalemia.  Blood cultures remain negative.  We will discontinue antibiotics.  C. difficile PCR was negative.  BNP slightly elevated at 193.  Discontinuing IV fluid.  Assessment and Plan: * Generalized weakness Patient with history of recurrent UTI and failure to thrive at home.  Unable to take care of himself and appears dehydrated. PT recommended SNF during most recent hospitalization but he declined at that time and decided to go home with home health. Patient would like to proceed with rehab placement at this time. -TOC snf bed search underway  History of recurrent UTI and ESBL UTI  Patient with history of recurrent UTIs with chronic indwelling catheter in place.  UA still looked dirty but no leukocytosis or fever.  No urinary symptoms except chronic dribbling around Foley catheter.  He was placed on cefepime based on most recent urine culture. -Discontinued antibiotics as blood cultures remain negative, no clinical s/s of uti  Renal cell carcinoma s/p nephrectomy, left Apparently in remission. -Continue outpatient follow-up  BPH with obstruction/lower urinary tract symptoms Being managed by his urologist at [facility].  History of recent procedure and a chronic indwelling catheter is in place. Foley was exchanged earlier this month -Continue with catheter care -Continue home Myrbetriq and finasteride -Continue with outpatient urology follow-up  Essential hypertension Blood pressure with an lower normal limit. Mild intermittent tachycardia. 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  Chronically ill-appearing elderly man, in no acute distress. Pulmonary.  Lungs clear bilaterally, normal respiratory effort. CV.  Regular rate and rhythm, no JVD, rub or murmur. Abdomen.  Soft, nontender, nondistended, BS positive. CNS.  Alert and oriented .  No focal neurologic deficit. Extremities.  No edema, no cyanosis, pulses intact and symmetrical. Psychiatry.  Appears to have some cognitive impairment  Data Reviewed: Prior data reviewed  Family Communication: daughter updated telephonically 3/27  Disposition: Status is: Inpatient Remains inpatient appropriate because: Severity of illness  Planned Discharge Destination: Skilled nursing facility  Time spent: 35 min  Author: Desma Maxim, MD 08/26/2022 11:44 AM  For on call review [email or web address].

## 2022-08-26 NOTE — Progress Notes (Signed)
Mobility Specialist - Progress Note   08/26/22 1600  Mobility  Activity Ambulated with assistance in hallway;Transferred to/from [facility];Transferred from chair to bed  Level of Assistance Standby assist, set-up cues, supervision of patient - no hands on  Assistive Device Front wheel walker  Distance Ambulated (ft) 20 ft  Activity Response Tolerated well  $Mobility charge 1 Mobility     Pt sitting in recliner upon arrival, utilizing RA. Pt STS 4x with modA +2 (first 2 trials) and minA +2 (last 2 trials). Unable to stand with maxA +1 this date. Incontinent BM upon standing, pt transferred to [facility] for continuation of BM. Assist for peri-care. Pt ambulated in hallway with minG. VC for increasing step length and posture. Further activity limited d/t 2nd incontinent BM. Pt returned to room via chair; peri-care performed. Pt transferred to bed with alarm set, needs in reach.    Kathee Delton Mobility Specialist 08/26/22, 5:05 PM

## 2022-08-26 NOTE — TOC Progression Note (Addendum)
Transition of Care (TOC) - Progression Note    Patient Details  Name: Adam Rios. MRN: [id number] Date of Birth: June 26, 1940  Transition of Care [place]) CM/SW Contact  Candie Chroman, LCSW Phone Number: 08/26/2022, 10:58 AM  Clinical Narrative:   CSW provided bed offers. Patient will review and CSW will follow up later today for decision.  4:02 pm: Patient has not chosen a SNF yet. Asked him to have decision by tomorrow morning.  Expected Discharge Plan: [place] Barriers to Discharge: Continued Medical Work up  Expected Discharge Plan and Services     Post Acute Care Choice: [place] Living arrangements for the past 2 months: Single Family Home                                       Social Determinants of Health (SDOH) Interventions SDOH Screenings   Food Insecurity: No Food Insecurity (08/24/2022)  Housing: Low Risk  (08/24/2022)  Transportation Needs: No Transportation Needs (08/24/2022)  Utilities: Not At Risk (08/24/2022)  Tobacco Use: Medium Risk (08/23/2022)    Readmission Risk Interventions    08/11/2022    4:53 PM 06/11/2022    2:19 PM 02/12/2022    2:17 PM  Readmission Risk Prevention Plan  Transportation Screening Complete Complete Complete  PCP or Specialist Appt within 3-5 Days  Complete Complete  HRI or Home Care Consult  Complete Complete  Social Work Consult for [place] Planning/Counseling  Complete Complete  Palliative Care Screening  Not Applicable Not Applicable  Medication Review Press photographer) Complete Complete Referral to Pharmacy  [facility] or [place] Complete    Skilled Nursing Facility Complete

## 2022-08-27 DIAGNOSIS — R531 Weakness: Secondary | ICD-10-CM | POA: Diagnosis not present

## 2022-08-27 MED ORDER — MIRABEGRON ER 50 MG PO TB24
50.0000 mg | ORAL_TABLET | Freq: Every day | ORAL | Status: DC
  Administered 2022-08-28: 50 mg via ORAL
  Filled 2022-08-27: qty 1

## 2022-08-27 NOTE — Progress Notes (Signed)
Progress Note   Patient: Adam Rios. [id number] DOB: 1941/05/01 DOA: 08/23/2022     4 DOS: the patient was seen and examined on 08/27/2022   Brief hospital course: Adam Rios. is a 82 y.o. male with medical history significant of recurrent UTIs, cognitive impairment, history of CVA, essential hypertension, renal cell carcinoma, neurogenic bladder, chronic indwelling Foley catheter, some dementia, insomnia, chronic diastolic heart failure and history of right lower extremity cellulitis who was just recently admitted and discharged from the hospital with complex UTI.  Patient was discharged home and is now back with similar complaints.  During her last admission he did very well prior to discharge.  But he is now weak unable to function.  Back with significant hypokalemia, hyponatremia as well as AKI.  He has had poor oral intake.  Urinalysis also showed very dirty urine.  Has evidence of recurrent UTI again.    Our physical therapist recommended going to rehab during most recent hospitalization but patient refused and made that poor choice, he was sent home with home health but he does not have any other help at home to make to his appointments.   During this most recent hospitalization he was also found to have urine leaking around the Foley catheter, urology was consulted and it does appear to be a chronic issue for which he is using depends at home.  They were recommending follow-up with his own urologist at [facility].    3/25: Blood pressure borderline soft at 94/59, rest of vitals stable.  Improving sodium and potassium, procalcitonin negative.  Lactic acidosis has been resolved-most likely secondary to poor p.o. intake and dehydration.  UA does look dirty but most likely colonization due to chronic indwelling catheter.  Currently on cefepime-if blood cultures remain negative in 24 hours then we can discontinue antibiotics as he recently received adequate treatment for UTI. Pending PT/OT  evaluation.  TOC was consulted for placement based on recent recommendations.   3/26: Vitals stable, labs with mild but improving hypokalemia.  Blood cultures remain negative.  We will discontinue antibiotics.  C. difficile PCR was negative.  BNP slightly elevated at 193.  Discontinuing IV fluid.  Assessment and Plan: * Generalized weakness Patient with history of recurrent UTI and failure to thrive at home.  Unable to take care of himself and appears dehydrated. PT recommended SNF during most recent hospitalization but he declined at that time and decided to go home with home health. Patient would like to proceed with rehab placement at this time. -TOC snf bed search underway  History of recurrent UTI and ESBL UTI  Patient with history of recurrent UTIs with chronic indwelling catheter in place.  UA still looked dirty but no leukocytosis or fever.  No urinary symptoms except chronic dribbling around Foley catheter.  He was placed on cefepime based on most recent urine culture. -Discontinued antibiotics as blood cultures remain negative, no clinical s/s of uti  Renal cell carcinoma s/p nephrectomy, left Apparently in remission. -Continue outpatient follow-up  Neurogenic bladder BPH s/p holep Being managed by his urologist at [facility].  History of recent procedure and a chronic indwelling catheter is in place. Foley was exchanged earlier this month. Urology consulted this hospitlization due to patient complaint of leakage around the catheter. They advise outpt urology f/u, increase dose myrbetriq to help with bladder spasm -Continue with catheter care -Continue home Myrbetriq and finasteride, will increase dose of the former -Continue with outpatient urology follow-up  Essential hypertension Blood  pressure with an lower normal limit. Mild intermittent tachycardia. 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  Chronically ill-appearing elderly man, in no acute distress. Pulmonary.  Lungs clear bilaterally, normal respiratory effort. CV.  Regular rate and rhythm, no JVD, rub or murmur. Abdomen.  Soft, nontender, nondistended, BS positive. CNS.  Alert and oriented .  No focal neurologic deficit. Extremities.  No edema, no cyanosis, pulses intact and symmetrical. Psychiatry.  Appears to have some cognitive impairment  Data Reviewed: Prior data reviewed  Family Communication: daughter updated telephonically 3/27. No answer when called today  Disposition: Status is: Inpatient Remains inpatient appropriate because: Severity of illness  Planned Discharge Destination: Skilled nursing facility  Time spent: 35 min  Author: Desma Maxim, MD 08/27/2022 3:20 PM  For on call review [email or web address].

## 2022-08-27 NOTE — TOC Progression Note (Signed)
Transition of Care (TOC) - Progression Note    Patient Details  Name: Adam Rios. MRN: [id number] Date of Birth: 30-Mar-1941  Transition of Care [facility]) CM/SW [place], LCSW Phone Number: 08/27/2022, 3:41 PM  Clinical Narrative:  Patient has accepted bed offer from [facility]. Admissions coordinator is aware. CSW started insurance authorization.   Expected Discharge Plan: [place] Barriers to Discharge: Continued Medical Work up  Expected Discharge Plan and Services     Post Acute Care Choice: [place] Living arrangements for the past 2 months: Single Family Home                                       Social Determinants of Health (SDOH) Interventions SDOH Screenings   Food Insecurity: No Food Insecurity (08/24/2022)  Housing: Low Risk  (08/24/2022)  Transportation Needs: No Transportation Needs (08/24/2022)  Utilities: Not At Risk (08/24/2022)  Tobacco Use: Medium Risk (08/23/2022)    Readmission Risk Interventions    08/11/2022    4:53 PM 06/11/2022    2:19 PM 02/12/2022    2:17 PM  Readmission Risk Prevention Plan  Transportation Screening Complete Complete Complete  PCP or Specialist Appt within 3-5 Days  Complete Complete  HRI or Home Care Consult  Complete Complete  Social Work Consult for [place] Planning/Counseling  Complete Complete  Palliative Care Screening  Not Applicable Not Applicable  Medication Review Press photographer) Complete Complete Referral to Pharmacy  [facility] or [place] Complete    Skilled Nursing Facility Complete

## 2022-08-27 NOTE — Progress Notes (Signed)
Physical Therapy Treatment Patient Details Name: Adam Rios. MRN: [id number] DOB: 25-Jun-1940 Today's Date: 08/27/2022   History of Present Illness Pt is an 82 y/o M admitted on 08/23/22 after presenting with c/o weakness. Pt found to have hypokalemia, hyponatremia, & AKI. Pt is being treated for recurrent complex UTI & generalized weakness. PMH: recurrent UTIs, cognitive impairment, CVA, HTN, renal cell carcinoma, neurogenic bladder, chronic indwelling Foley catheter, dementia, insomnia, chronic diastolic heart failure, RLE cellulitis    PT Comments    Pt resting in recliner upon PT arrival; initially planning on focusing on strengthening activities/ex's d/t pt reporting already walking this morning but pt requesting to walk again instead.  During session pt mod to max assist to stand from recliner x2 trials and CGA to ambulate 45 feet x2 (sitting rest break between) with RW use.  Improved walking cadence noted 2nd ambulation trial.  Will continue to focus on strengthening, endurance, balance, and progressive functional mobility during hospitalization.    Recommendations for follow up therapy are one component of a multi-disciplinary discharge planning process, led by the attending physician.  Recommendations may be updated based on patient status, additional functional criteria and insurance authorization.  Follow Up Recommendations  Can patient physically be transported by private vehicle: No    Assistance Recommended at Discharge Frequent or constant Supervision/Assistance  Patient can return home with the following A little help with bathing/dressing/bathroom;Assistance with cooking/housework;Assist for transportation;Help with stairs or ramp for entrance;A lot of help with walking and/or transfers   Equipment Recommendations  Rolling walker (2 wheels);BSC/3in1    Recommendations for Other Services       Precautions / Restrictions Precautions Precautions:  Fall Restrictions Weight Bearing Restrictions: No     Mobility  Bed Mobility               General bed mobility comments: Deferred (pt sitting in recliner beginning/end of session)    Transfers Overall transfer level: Needs assistance Equipment used: Rolling walker (2 wheels) Transfers: Sit to/from Stand Sit to Stand: Mod assist, Max assist           General transfer comment: x2 trials standing from recliner; vc's for scooting to edge of recliner, UE/LE placement, and overall technique; assist to initiate stand and control descent sitting    Ambulation/Gait Ambulation/Gait assistance: Min guard Gait Distance (Feet):  (45 feet x2) Assistive device: Rolling walker (2 wheels) Gait Pattern/deviations: Step-through pattern, Decreased step length - right, Decreased step length - left, Trunk flexed, Shuffle Gait velocity: decreased     General Gait Details: increased effort and time to take steps (although pt walking a little faster on 2nd trial); vc's for upright posture (pt with more flexed trunk with fatigue)   Stairs             Wheelchair Mobility    Modified Rankin (Stroke Patients Only)       Balance Overall balance assessment: Needs assistance Sitting-balance support: No upper extremity supported, Feet supported Sitting balance-Leahy Scale: Good Sitting balance - Comments: steady sitting reaching within BOS   Standing balance support: Bilateral upper extremity supported, Reliant on assistive device for balance, During functional activity Standing balance-Leahy Scale: Fair Standing balance comment: steady static standing in flexed posture using RW                            Cognition Arousal/Alertness: Awake/alert Behavior During Therapy: WFL for tasks assessed/performed Overall Cognitive Status: History of cognitive  impairments - at baseline                                 General Comments: did fairly well following  cues        Exercises      General Comments        Pertinent Vitals/Pain Pain Assessment Pain Assessment: 0-10 Pain Score: 7  Pain Location: B feet (neuropathy) Pain Descriptors / Indicators: Burning Pain Intervention(s): Limited activity within patient's tolerance, Monitored during session, Premedicated before session, Repositioned Vitals (HR and O2 on room air) stable and WFL throughout treatment session.    Home Living                          Prior Function            PT Goals (current goals can now be found in the care plan section) Acute Rehab PT Goals Patient Stated Goal: get better PT Goal Formulation: With patient Time For Goal Achievement: 09/07/22 Potential to Achieve Goals: Fair Progress towards PT goals: Progressing toward goals    Frequency    Min 2X/week      PT Plan Current plan remains appropriate    Co-evaluation              AM-PAC PT "6 Clicks" Mobility   Outcome Measure  Help needed turning from your back to your side while in a flat bed without using bedrails?: A Little Help needed moving from lying on your back to sitting on the side of a flat bed without using bedrails?: A Little Help needed moving to and from a bed to a chair (including a wheelchair)?: A Little Help needed standing up from a chair using your arms (e.g., wheelchair or bedside chair)?: A Lot Help needed to walk in hospital room?: A Little Help needed climbing 3-5 steps with a railing? : A Lot 6 Click Score: 16    End of Session Equipment Utilized During Treatment: Gait belt Activity Tolerance: Patient tolerated treatment well Patient left: in chair;with call bell/phone within reach;with chair alarm set Nurse Communication: Other (comment) (pt reporting foley catheter leaking) PT Visit Diagnosis: Muscle weakness (generalized) (M62.81);Difficulty in walking, not elsewhere classified (R26.2);Unsteadiness on feet (R26.81)     Time: [id number] PT  Time Calculation (min) (ACUTE ONLY): 33 min  Charges:  $Gait Training: 8-22 mins $Therapeutic Activity: 8-22 mins                     Leitha Bleak, PT 08/27/22, 12:41 PM

## 2022-08-27 NOTE — Progress Notes (Addendum)
Mobility Specialist - Progress Note   08/27/22 1100  Mobility  Activity Ambulated with assistance in hallway  Level of Assistance Standby assist, set-up cues, supervision of patient - no hands on  Assistive Device Front wheel walker  Distance Ambulated (ft) 95 ft  Activity Response Tolerated well  $Mobility charge 1 Mobility     Pt lying in bed upon arrival, utilizing RA. Pt completed bed mobility with modA for trunk support. MaxA +1 and extra time to stand from standard bed height. Pt ambulated in hallway with minG. 1 seated rest break taken. ModA +2 to stand from recliner d/t feet sliding. VC for posture and increasing step-length. Pt becomes emotional and teary-eyed during session about "having to be shipped off somewhere"---active listening and support provided. Pt put forth good effort with session today. Pt left in chair with alarm set, needs in reach.    Kathee Delton Mobility Specialist 08/27/22, 11:32 AM

## 2022-08-28 DIAGNOSIS — R531 Weakness: Secondary | ICD-10-CM | POA: Diagnosis not present

## 2022-08-28 LAB — CULTURE, BLOOD (ROUTINE X 2)
Culture: NO GROWTH
Culture: NO GROWTH
Special Requests: ADEQUATE
Special Requests: ADEQUATE

## 2022-08-28 MED ORDER — TORSEMIDE 20 MG PO TABS: 20.0000 mg | ORAL_TABLET | Freq: Every day | ORAL | 0 refills | Status: DC

## 2022-08-28 MED ORDER — MIRABEGRON ER 50 MG PO TB24: 50.0000 mg | ORAL_TABLET | Freq: Every day | ORAL | Status: DC

## 2022-08-28 NOTE — Progress Notes (Signed)
Mobility Specialist - Progress Note   08/28/22 1100  Mobility  Activity Ambulated with assistance in hallway;Transferred from bed to chair  Level of Assistance Standby assist, set-up cues, supervision of patient - no hands on  Assistive Device Front wheel walker  Distance Ambulated (ft) 140 ft  Activity Response Tolerated well  $Mobility charge 1 Mobility     Pt lying in bed upon arrival, utilizing RA. Pt rolled L/R with minA for peri-care prior to OOB activity. Pt completed STS from bed with modA. Ambulated in hallway (95' + 61') with minG and 1 seated rest break. Pt left in recliner with alarm set, needs in reach.    Kathee Delton Mobility Specialist 08/28/22, 11:23 AM

## 2022-08-28 NOTE — TOC Transition Note (Addendum)
Transition of Care [facility]) - CM/SW Discharge Note   Patient Details  Name: Adam Rios. MRN: [id number] Date of Birth: 03-17-1941  [facility]) CM/SW Contact:  Laurena Slimmer, RN Phone Number: 08/28/2022, 11:04 AM   Clinical Narrative:    Navi# [id number] approved 3/28-4/01 MD notified.  Per facility patient can be accepted today Spoke with Adam Rios  in admissions Patient assigned room # 701 Nurse will call report to [phone] MD, nurse, and family notified.  Face sheet and medical necessity forms printed to the floor to be added to the EMS pack EMS arranged  Discharge summary and SNF tranfer report sent in [place].   Spoke with patient's granddaughter, Adam Rios. She stated she was never informed patient was going to [facility]. She was advised patient was presented with offers and choose [facility]. She was advised of patient's rights and his right to appeal or change facilities once he gets to [facility]. She agreed to proceed with discharge to [facility] today   12:30pm  Spoke with patient at bedside per his request. He stated he was no longer agreeable to go to SNF. He wanted to go home however he does not have a plan for care home or able to identify anyone that will assist him. He was not able to follow this RNCM while conversing. Per MD patient lacks decision making capacity.   12:45pm Spoke with patient's daughter regarding home dischage vs SNF to [facility]. She refused patient coming home and is agreeable to proceed with discharge today to [facility]. Patient's right were reiterated.   TOC signing off.    Barriers to Discharge: Continued Medical Work up   Patient Goals and CMS Choice      Discharge Placement                         Discharge Plan and Services Additional resources added to the After Visit Summary for       Post Acute Care Choice: [place]                               Social Determinants of Health (SDOH) Interventions SDOH Screenings    Food Insecurity: No Food Insecurity (08/24/2022)  Housing: Low Risk  (08/24/2022)  Transportation Needs: No Transportation Needs (08/24/2022)  Utilities: Not At Risk (08/24/2022)  Tobacco Use: Medium Risk (08/23/2022)     Readmission Risk Interventions    08/11/2022    4:53 PM 06/11/2022    2:19 PM 02/12/2022    2:17 PM  Readmission Risk Prevention Plan  Transportation Screening Complete Complete Complete  PCP or Specialist Appt within 3-5 Days  Complete Complete  HRI or Home Care Consult  Complete Complete  Social Work Consult for [place] Planning/Counseling  Complete Complete  Palliative Care Screening  Not Applicable Not Applicable  Medication Review Press photographer) Complete Complete Referral to Pharmacy  [facility] or [place] Complete    Skilled Nursing Facility Complete

## 2022-08-28 NOTE — Discharge Summary (Signed)
Adam Rios. [id number] DOB: Dec 15, 1940 DOA: 08/23/2022  PCP: Dola Argyle, MD  Admit date: 08/23/2022 Discharge date: 08/28/2022  Time spent: 35 minutes  Recommendations for Outpatient Follow-up:  Pcp and urology f/u    Discharge Diagnoses:  Principal Problem:   Generalized weakness Active Problems:   History of recurrent UTI and ESBL UTI    Renal cell carcinoma s/p nephrectomy, left   BPH with obstruction/lower urinary tract symptoms   Essential hypertension   Hypokalemia   History of CVA (cerebrovascular accident)   GERD (gastroesophageal reflux disease)   Heart failure with preserved ejection fraction ([place])   Dyslipidemia   Discharge Condition: stable  Diet recommendation: heart healthy  Filed Weights   08/23/22 2116 08/24/22 0130  Weight: 101.2 kg 95 kg    History of present illness:  From admission h and p  Adam Rios. is a 82 y.o. male with medical history significant of recurrent UTIs, cognitive impairment, history of CVA, essential hypertension, renal cell carcinoma, neurogenic bladder, chronic indwelling Foley catheter, some dementia, insomnia, chronic diastolic heart failure and history of right lower extremity cellulitis who was just recently admitted and discharged from the hospital with complex UTI.  Patient was discharged home and is now back with similar complaints.  During her last admission he did very well prior to discharge.  But he is now weak unable to function.  Back with significant hypokalemia, hyponatremia as well as AKI.  He has had poor oral intake.  Urinalysis also showed very dirty urine.  Has evidence of recurrent UTI again.  Patient is being admitted for further evaluation and treatment.   Hospital Course:  Patient admitted with generalized weakness, failure to thrive at home. Discharged to skilled nursing. Recent treatment for UTI but no signs or symptoms of such here. Does have neurogenic bladder and chronic indwelling foley.  He complained of leakage around the foley and urology was consulted. They advised outpt f/u with urology but did suggest trial of increasing dose of myrbetriq so we have done that. Chronic medical problems are stable.  Procedures: none   Consultations: urology  Discharge Exam: Vitals:   08/28/22 0510 08/28/22 0840  BP: 121/68 125/73  Pulse: 62 62  Resp: 20 14  Temp: 98.1 F (36.7 C) 97.6 F (36.4 C)  SpO2: 96% 97%    General.  Chronically ill-appearing elderly man, in no acute distress. Pulmonary.  Lungs clear bilaterally, normal respiratory effort. CV.  Regular rate and rhythm, no JVD, rub or murmur. Abdomen.  Soft, nontender, nondistended, BS positive. CNS.  Alert and oriented .  No focal neurologic deficit. Extremities.  No edema, no cyanosis, pulses intact and symmetrical. Psychiatry.  Appears to have some cognitive impairment  Discharge Instructions   Discharge Instructions     Diet - low sodium heart healthy   Complete by: As directed    Increase activity slowly   Complete by: As directed    No wound care   Complete by: As directed       Allergies as of 08/28/2022       Reactions   Empagliflozin    Recurrent UTI with sepsis on jardiance.   Fentanyl Nausea Only   Oxycodone Nausea And Vomiting        Medication List     TAKE these medications    acetaminophen 325 MG tablet Commonly known as: TYLENOL Take 500 mg by mouth every 6 (six) hours as needed.   aspirin EC 81 MG tablet Take  81 mg by mouth daily as needed.   atorvastatin 40 MG tablet Commonly known as: LIPITOR Take 40 mg by mouth daily.   [employer] Cortisone Maximum Strength 1 % Generic drug: hydrocortisone cream Apply 1 Application topically as needed for itching.   cyanocobalamin 1000 MCG tablet Commonly known as: VITAMIN B12 Take 1,000 mcg by mouth daily.   cyclobenzaprine 5 MG tablet Commonly known as: FLEXERIL Take 5-10 mg by mouth 3 (three) times daily as needed for muscle  spasms.   diclofenac Sodium 1 % Gel Commonly known as: VOLTAREN Apply 2 g topically 2 (two) times daily as needed.   donepezil 10 MG tablet Commonly known as: ARICEPT Take 10 mg by mouth at bedtime.   finasteride 5 MG tablet Commonly known as: PROSCAR Take 5 mg by mouth daily.   gabapentin 300 MG capsule Commonly known as: NEURONTIN Take 1 capsule (300 mg total) by mouth at bedtime.   metoprolol succinate 25 MG 24 hr tablet Commonly known as: TOPROL-XL Take 25 mg by mouth daily.   mirabegron ER 50 MG Tb24 tablet Commonly known as: MYRBETRIQ Take 1 tablet (50 mg total) by mouth daily. What changed:  medication strength how much to take   omeprazole 20 MG capsule Commonly known as: PRILOSEC Take 1 capsule (20 mg total) by mouth daily.   polyethylene glycol 17 g packet Commonly known as: MIRALAX / GLYCOLAX Take 17 g by mouth daily as needed for severe constipation.   potassium chloride SA 20 MEQ tablet Commonly known as: KLOR-CON M Take 1 tablet (20 mEq total) by mouth daily.   senna-docusate 8.6-50 MG tablet Commonly known as: Senokot-S Take 1 tablet by mouth 2 (two) times daily as needed.   torsemide 20 MG tablet Commonly known as: DEMADEX Take 1 tablet (20 mg total) by mouth daily. What changed: when to take this       Allergies  Allergen Reactions   Empagliflozin     Recurrent UTI with sepsis on jardiance.   Fentanyl Nausea Only   Oxycodone Nausea And Vomiting    Contact information for follow-up providers     Dola Argyle, MD Follow up.   Specialty: Family Medicine        [facility] urologist Follow up.               Contact information for after-discharge care     Destination     [facility] Preferred SNF .   Service: Skilled Nursing Contact information: [address] [place] [phone]                      The results of significant diagnostics from this  hospitalization (including imaging, microbiology, ancillary and laboratory) are listed below for reference.    Significant Diagnostic Studies: CT Head Wo Contrast  Result Date: 08/23/2022 CLINICAL DATA:  Altered mental status and recent syncopal episode EXAM: CT HEAD WITHOUT CONTRAST TECHNIQUE: Contiguous axial images were obtained from the base of the skull through the vertex without intravenous contrast. RADIATION DOSE REDUCTION: This exam was performed according to the departmental dose-optimization program which includes automated exposure control, adjustment of the mA and/or kV according to patient size and/or use of iterative reconstruction technique. COMPARISON:  07/10/2022 FINDINGS: Brain: No evidence of acute infarction, hemorrhage, hydrocephalus, extra-axial collection or mass lesion/mass effect. Chronic atrophic and ischemic changes are again seen and stable. Vascular: No hyperdense vessel or unexpected calcification. Skull: Normal. Negative for fracture  or focal lesion. Sinuses/Orbits: No acute finding. Other: None. IMPRESSION: Chronic atrophic and ischemic changes without acute abnormality. Electronically Signed   By: Inez Catalina M.D.   On: 08/23/2022 23:51   DG Chest Port 1 View  Result Date: 08/23/2022 CLINICAL DATA:  Questionable sepsis. EXAM: PORTABLE CHEST 1 VIEW COMPARISON:  Portable chest 08/09/2022 FINDINGS: Mild cardiomegaly. No evidence of CHF. Stable dual lead left chest pacing system and wire insertions. The mediastinum is stable with aortic tortuosity, calcification and ectasia. There are calcified azygous and AP window level mediastinal nodes. There is chronic elevation of the right hemidiaphragm. Questionable developing hazy infiltrate right upper lobe mid field periphery. The remaining lungs are clear. No substantial pleural effusion is seen. Osteopenia and thoracic spondylosis. PA and lateral study in full inspiration is recommended to see if the above persists. In all other  respects no further changes. IMPRESSION: 1. Questionable developing hazy infiltrate right upper lobe periphery. Followup PA and lateral chest X-ray is recommended to see if this persists. Otherwise, if pneumonia is clinically suspected, a follow-up study is recommended after treatment to ensure clearing. 2. Chronic elevation of the right hemidiaphragm. 3. Aortic atherosclerosis. Electronically Signed   By: Telford Nab M.D.   On: 08/23/2022 22:31   DG Chest Port 1 View  Result Date: 08/09/2022 CLINICAL DATA:  Sepsis EXAM: PORTABLE CHEST 1 VIEW COMPARISON:  Chest radiograph 07/10/2022 FINDINGS: Multi lead pacer apparatus overlies the left hemithorax, stable in position. Stable cardiomegaly. Aortic tortuosity and atherosclerosis. Persistent elevation right hemidiaphragm. Similar bilateral mid lower lung heterogeneous opacities. No pleural effusion or pneumothorax. Thoracic spine degenerative changes. IMPRESSION: Similar bilateral mid and lower lung heterogeneous opacities which may represent atelectasis or infection. Electronically Signed   By: Lovey Newcomer M.D.   On: 08/09/2022 18:35   CT ABDOMEN PELVIS WO CONTRAST  Result Date: 08/09/2022 CLINICAL DATA:  Abdominal and flank pain.  Kidney stones suspected. EXAM: CT ABDOMEN AND PELVIS WITHOUT CONTRAST TECHNIQUE: Multidetector CT imaging of the abdomen and pelvis was performed following the standard protocol without IV contrast. RADIATION DOSE REDUCTION: This exam was performed according to the departmental dose-optimization program which includes automated exposure control, adjustment of the mA and/or kV according to patient size and/or use of iterative reconstruction technique. COMPARISON:  04/30/2022. FINDINGS: Lower chest: Dependent atelectasis noted bilaterally. Ascending thoracic aorta is 4.6 cm diameter. Hepatobiliary: Stable 3.7 cm left hepatic cyst. No followup imaging is recommended. There is no evidence for gallstones, gallbladder wall thickening,  or pericholecystic fluid. No intrahepatic or extrahepatic biliary dilation. Pancreas: No focal mass lesion. No dilatation of the main duct. No intraparenchymal cyst. No peripancreatic edema. Spleen: No splenomegaly. No focal mass lesion. Adrenals/Urinary Tract: No adrenal nodule or mass. Right kidney unremarkable. Left kidney surgically absent. Right ureter unremarkable. Bladder decompressed by Foley catheter. Gas in the bladder lumen compatible with the instrumentation. Stomach/Bowel: Stomach is unremarkable. No gastric wall thickening. No evidence of outlet obstruction. Duodenum is normally positioned as is the ligament of Treitz. No small bowel wall thickening. No small bowel dilatation. The terminal ileum is normal. The appendix is not well visualized, but there is no edema or inflammation in the region of the cecum. No gross colonic mass. No colonic wall thickening. Vascular/Lymphatic: There is moderate atherosclerotic calcification of the abdominal aorta without aneurysm. There is no gastrohepatic or hepatoduodenal ligament lymphadenopathy. No retroperitoneal or mesenteric lymphadenopathy. No pelvic sidewall lymphadenopathy. Reproductive: Unremarkable. Other: No intraperitoneal free fluid. Musculoskeletal: No worrisome lytic or sclerotic osseous abnormality. Diffuse degenerative  disc disease noted lumbar spine. IMPRESSION: 1. No acute findings in the abdomen or pelvis. Specifically, no findings to explain the patient's history of abdominal pain. 2. Status post left nephrectomy. 3. 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 Ascending thoracic aortic aneurysm. Recommend semi-annual imaging followup by CTA or MRA and referral to cardiothoracic surgery if not already obtained. This recommendation follows 2010 ACCF/AHA/AATS/ACR/ASA/SCA/SCAI/SIR/STS/SVM Guidelines for the Diagnosis and Management of Patients With Thoracic Aortic Disease. Circulation. 2010; [id number]. Aortic aneurysm NOS (ICD10-I71.9) 4.  Aortic  Atherosclerosis (ICD10-I70.0). Electronically Signed   By: Misty Stanley M.D.   On: 08/09/2022 16:34    Microbiology: Recent Results (from the past 240 hour(s))  Blood Culture (routine x 2)     Status: None   Collection Time: 08/23/22  9:42 PM   Specimen: BLOOD  Result Value Ref Range Status   Specimen Description BLOOD BLOOD LEFT FOREARM  Final   Special Requests   Final    BOTTLES DRAWN AEROBIC AND ANAEROBIC Blood Culture adequate volume   Culture   Final    NO GROWTH 5 DAYS Performed at [facility], [address] [place], [place] [address]    Report Status 08/28/2022 FINAL  Final  Blood Culture (routine x 2)     Status: None   Collection Time: 08/23/22  9:42 PM   Specimen: BLOOD  Result Value Ref Range Status   Specimen Description BLOOD BLOOD RIGHT ARM  Final   Special Requests   Final    BOTTLES DRAWN AEROBIC AND ANAEROBIC Blood Culture adequate volume   Culture   Final    NO GROWTH 5 DAYS Performed at [facility], [address] [place], [place] [address]    Report Status 08/28/2022 FINAL  Final  Resp panel by RT-PCR (RSV, Flu A&B, Covid) Anterior Nasal Swab     Status: None   Collection Time: 08/23/22  9:42 PM   Specimen: Anterior Nasal Swab  Result Value Ref Range Status   SARS Coronavirus 2 by RT PCR NEGATIVE NEGATIVE Final    Comment: (NOTE) SARS-CoV-2 target nucleic acids are NOT DETECTED.  The SARS-CoV-2 RNA is generally detectable in upper respiratory specimens during the acute phase of infection. The lowest concentration of SARS-CoV-2 viral copies this assay can detect is 138 copies/mL. A negative result does not preclude SARS-Cov-2 infection and should not be used as the sole basis for treatment or other patient management decisions. A negative result may occur with  improper specimen collection/handling, submission of specimen other than nasopharyngeal swab, presence of viral mutation(s) within the areas targeted by this assay, and  inadequate number of viral copies(<138 copies/mL). A negative result must be combined with clinical observations, patient history, and epidemiological information. The expected result is Negative.  Fact Sheet for Patients:  EntrepreneurPulse.com.au  Fact Sheet for Healthcare Providers:  IncredibleEmployment.be  This test is no t yet approved or cleared by the [place] FDA and  has been authorized for detection and/or diagnosis of SARS-CoV-2 by FDA under an Emergency Use Authorization (EUA). This EUA will remain  in effect (meaning this test can be used) for the duration of the COVID-19 declaration under Section 564(b)(1) of the Act, 21 U.S.C.section 360bbb-3(b)(1), unless the authorization is terminated  or revoked sooner.       Influenza A by PCR NEGATIVE NEGATIVE Final   Influenza B by PCR NEGATIVE NEGATIVE Final    Comment: (NOTE) The Xpert Xpress SARS-CoV-2/FLU/RSV plus assay is intended as an aid in the diagnosis  of influenza from Nasopharyngeal swab specimens and should not be used as a sole basis for treatment. Nasal washings and aspirates are unacceptable for Xpert Xpress SARS-CoV-2/FLU/RSV testing.  Fact Sheet for Patients: EntrepreneurPulse.com.au  Fact Sheet for Healthcare Providers: IncredibleEmployment.be  This test is not yet approved or cleared by the [place] FDA and has been authorized for detection and/or diagnosis of SARS-CoV-2 by FDA under an Emergency Use Authorization (EUA). This EUA will remain in effect (meaning this test can be used) for the duration of the COVID-19 declaration under Section 564(b)(1) of the Act, 21 U.S.C. section 360bbb-3(b)(1), unless the authorization is terminated or revoked.     Resp Syncytial Virus by PCR NEGATIVE NEGATIVE Final    Comment: (NOTE) Fact Sheet for Patients: EntrepreneurPulse.com.au  Fact Sheet for Healthcare  Providers: IncredibleEmployment.be  This test is not yet approved or cleared by the [place] FDA and has been authorized for detection and/or diagnosis of SARS-CoV-2 by FDA under an Emergency Use Authorization (EUA). This EUA will remain in effect (meaning this test can be used) for the duration of the COVID-19 declaration under Section 564(b)(1) of the Act, 21 U.S.C. section 360bbb-3(b)(1), unless the authorization is terminated or revoked.  Performed at [facility], [place], [place] [address]   C Difficile Quick Screen w PCR reflex     Status: None   Collection Time: 08/24/22  9:56 AM   Specimen: STOOL  Result Value Ref Range Status   C Diff antigen NEGATIVE NEGATIVE Final   C Diff toxin NEGATIVE NEGATIVE Final   C Diff interpretation No C. difficile detected.  Final    Comment: Performed at [facility], [address] [place], [place] [address]     Labs: Basic Metabolic Panel: Recent Labs  Lab 08/23/22 2142 08/24/22 0603 08/24/22 1223 08/25/22 0454 08/26/22 0950  NA 130* 133*  --  134* 137  K 2.8* 3.1* 2.9* 3.3* 3.5  CL 94* 96*  --  103 105  CO2 25 25  --  24 24  GLUCOSE 122* 123*  --  90 130*  BUN 27* 25*  --  19 14  CREATININE 1.39* 1.16  --  1.16 1.12  CALCIUM 8.7* 8.5*  --  8.4* 8.9  MG  --  1.8  --   --  1.7   Liver Function Tests: Recent Labs  Lab 08/23/22 2142 08/24/22 0603  AST 29 23  ALT 24 18  ALKPHOS 90 69  BILITOT 1.0 0.8  PROT 7.4 6.0*  ALBUMIN 3.8 3.0*   No results for input(s): "LIPASE", "AMYLASE" in the last 168 hours. No results for input(s): "AMMONIA" in the last 168 hours. CBC: Recent Labs  Lab 08/23/22 2142 08/24/22 0603  WBC 9.6 8.6  NEUTROABS 6.9  --   HGB 12.8* 10.6*  HCT 40.2 32.7*  MCV 82.4 82.6  PLT 209 154   Cardiac Enzymes: No results for input(s): "CKTOTAL", "CKMB", "CKMBINDEX", "TROPONINI" in the last 168 hours. BNP: BNP (last 3 results) Recent Labs     01/01/22 1720 04/29/22 1642 08/24/22 1223  BNP 71.9 119.6* 193.7*    ProBNP (last 3 results) No results for input(s): "PROBNP" in the last 8760 hours.  CBG: No results for input(s): "GLUCAP" in the last 168 hours.     Signed:  Desma Maxim MD.  [facility] 08/28/2022, 11:28 AM

## 2022-08-28 NOTE — Care Management Important Message (Signed)
Important Message  Patient Details  Name: Adam Rios. MRN: [id number] Date of Birth: Apr 17, 1941   Medicare Important Message Given:  Yes  Reviewed Medicare IM with patient via room phone [phone].  Also reviewed Medicare IM with Nunzio Cobbs, daughter, at [phone].  Copy of Medicare IM sent securely to daughter's attention at [email or web address].  A copy of Medicare IM was to be delivered to patient's room as well, though learned that patient had already been picked up by EMS prior to being delivered.    [name] 08/28/2022, 2:36 PM

## 2022-08-28 NOTE — Plan of Care (Signed)
  Problem: Education: Goal: Knowledge of General Education information will improve Description: Including pain rating scale, medication(s)/side effects and non-pharmacologic comfort measures Outcome: Progressing   Problem: Clinical Measurements: Goal: Respiratory complications will improve Outcome: Progressing   Problem: Clinical Measurements: Goal: Cardiovascular complication will be avoided Outcome: Progressing   Problem: Activity: Goal: Risk for activity intolerance will decrease Outcome: Progressing   Problem: Nutrition: Goal: Adequate nutrition will be maintained Outcome: Progressing   Problem: Coping: Goal: Level of anxiety will decrease Outcome: Progressing   Problem: Pain Managment: Goal: General experience of comfort will improve Outcome: Progressing   Problem: Safety: Goal: Ability to remain free from injury will improve Outcome: Progressing   Problem: Skin Integrity: Goal: Risk for impaired skin integrity will decrease Outcome: Progressing   

## 2022-09-28 ENCOUNTER — Emergency Department: Payer: Medicare Other

## 2022-09-28 ENCOUNTER — Inpatient Hospital Stay
Admission: EM | Admit: 2022-09-28 | Discharge: 2022-10-13 | DRG: 698 | Disposition: A | Discharge status: Home Health | Payer: Medicare Other | Attending: Student | Admitting: Student

## 2022-09-28 ENCOUNTER — Encounter: Payer: Self-pay | Admitting: Internal Medicine

## 2022-09-28 ENCOUNTER — Other Ambulatory Visit: Payer: Self-pay

## 2022-09-28 DIAGNOSIS — Z8673 Personal history of transient ischemic attack (TIA), and cerebral infarction without residual deficits: Secondary | ICD-10-CM

## 2022-09-28 DIAGNOSIS — A4159 Other Gram-negative sepsis: Secondary | ICD-10-CM | POA: Diagnosis present

## 2022-09-28 DIAGNOSIS — G629 Polyneuropathy, unspecified: Secondary | ICD-10-CM | POA: Diagnosis present

## 2022-09-28 DIAGNOSIS — E876 Hypokalemia: Secondary | ICD-10-CM | POA: Diagnosis present

## 2022-09-28 DIAGNOSIS — D649 Anemia, unspecified: Secondary | ICD-10-CM | POA: Diagnosis present

## 2022-09-28 DIAGNOSIS — I472 Ventricular tachycardia, unspecified: Secondary | ICD-10-CM | POA: Diagnosis present

## 2022-09-28 DIAGNOSIS — N179 Acute kidney failure, unspecified: Secondary | ICD-10-CM | POA: Diagnosis present

## 2022-09-28 DIAGNOSIS — E860 Dehydration: Secondary | ICD-10-CM | POA: Diagnosis present

## 2022-09-28 DIAGNOSIS — I110 Hypertensive heart disease with heart failure: Secondary | ICD-10-CM | POA: Diagnosis present

## 2022-09-28 DIAGNOSIS — Z87891 Personal history of nicotine dependence: Secondary | ICD-10-CM | POA: Diagnosis not present

## 2022-09-28 DIAGNOSIS — I739 Peripheral vascular disease, unspecified: Secondary | ICD-10-CM | POA: Diagnosis present

## 2022-09-28 DIAGNOSIS — Z85528 Personal history of other malignant neoplasm of kidney: Secondary | ICD-10-CM | POA: Diagnosis not present

## 2022-09-28 DIAGNOSIS — N138 Other obstructive and reflux uropathy: Secondary | ICD-10-CM | POA: Diagnosis present

## 2022-09-28 DIAGNOSIS — E559 Vitamin D deficiency, unspecified: Secondary | ICD-10-CM | POA: Diagnosis present

## 2022-09-28 DIAGNOSIS — K219 Gastro-esophageal reflux disease without esophagitis: Secondary | ICD-10-CM | POA: Diagnosis present

## 2022-09-28 DIAGNOSIS — Z7902 Long term (current) use of antithrombotics/antiplatelets: Secondary | ICD-10-CM

## 2022-09-28 DIAGNOSIS — T83511A Infection and inflammatory reaction due to indwelling urethral catheter, initial encounter: Principal | ICD-10-CM | POA: Diagnosis present

## 2022-09-28 DIAGNOSIS — I5033 Acute on chronic diastolic (congestive) heart failure: Secondary | ICD-10-CM | POA: Diagnosis present

## 2022-09-28 DIAGNOSIS — R6520 Severe sepsis without septic shock: Secondary | ICD-10-CM | POA: Diagnosis present

## 2022-09-28 DIAGNOSIS — Z79899 Other long term (current) drug therapy: Secondary | ICD-10-CM

## 2022-09-28 DIAGNOSIS — R4182 Altered mental status, unspecified: Principal | ICD-10-CM

## 2022-09-28 DIAGNOSIS — F0390 Unspecified dementia without behavioral disturbance: Secondary | ICD-10-CM | POA: Diagnosis present

## 2022-09-28 DIAGNOSIS — N401 Enlarged prostate with lower urinary tract symptoms: Secondary | ICD-10-CM | POA: Diagnosis present

## 2022-09-28 DIAGNOSIS — Z7982 Long term (current) use of aspirin: Secondary | ICD-10-CM

## 2022-09-28 DIAGNOSIS — N390 Urinary tract infection, site not specified: Secondary | ICD-10-CM | POA: Diagnosis present

## 2022-09-28 DIAGNOSIS — Z1330 Encounter for screening examination for mental health and behavioral disorders, unspecified: Secondary | ICD-10-CM | POA: Diagnosis not present

## 2022-09-28 DIAGNOSIS — Z950 Presence of cardiac pacemaker: Secondary | ICD-10-CM

## 2022-09-28 DIAGNOSIS — I442 Atrioventricular block, complete: Secondary | ICD-10-CM | POA: Diagnosis present

## 2022-09-28 DIAGNOSIS — E785 Hyperlipidemia, unspecified: Secondary | ICD-10-CM | POA: Diagnosis present

## 2022-09-28 DIAGNOSIS — Z905 Acquired absence of kidney: Secondary | ICD-10-CM

## 2022-09-28 DIAGNOSIS — A419 Sepsis, unspecified organism: Secondary | ICD-10-CM | POA: Diagnosis present

## 2022-09-28 DIAGNOSIS — G9341 Metabolic encephalopathy: Secondary | ICD-10-CM | POA: Diagnosis present

## 2022-09-28 DIAGNOSIS — I10 Essential (primary) hypertension: Secondary | ICD-10-CM | POA: Diagnosis present

## 2022-09-28 DIAGNOSIS — N319 Neuromuscular dysfunction of bladder, unspecified: Secondary | ICD-10-CM | POA: Diagnosis present

## 2022-09-28 DIAGNOSIS — I639 Cerebral infarction, unspecified: Secondary | ICD-10-CM | POA: Insufficient documentation

## 2022-09-28 DIAGNOSIS — I5032 Chronic diastolic (congestive) heart failure: Secondary | ICD-10-CM | POA: Diagnosis present

## 2022-09-28 DIAGNOSIS — Y846 Urinary catheterization as the cause of abnormal reaction of the patient, or of later complication, without mention of misadventure at the time of the procedure: Secondary | ICD-10-CM | POA: Diagnosis present

## 2022-09-28 DIAGNOSIS — I4891 Unspecified atrial fibrillation: Secondary | ICD-10-CM | POA: Insufficient documentation

## 2022-09-28 LAB — COMPREHENSIVE METABOLIC PANEL
ALT: 16 U/L (ref 0–44)
AST: 25 U/L (ref 15–41)
Albumin: 3.7 g/dL (ref 3.5–5.0)
Alkaline Phosphatase: 93 U/L (ref 38–126)
Anion gap: 13 (ref 5–15)
BUN: 27 mg/dL — ABNORMAL HIGH (ref 8–23)
CO2: 26 mmol/L (ref 22–32)
Calcium: 8.5 mg/dL — ABNORMAL LOW (ref 8.9–10.3)
Chloride: 97 mmol/L — ABNORMAL LOW (ref 98–111)
Creatinine, Ser: 1.47 mg/dL — ABNORMAL HIGH (ref 0.61–1.24)
GFR, Estimated: 47 mL/min — ABNORMAL LOW (ref >60)
Glucose, Bld: 172 mg/dL — ABNORMAL HIGH (ref 70–99)
Potassium: 2.5 mmol/L — CL (ref 3.5–5.1)
Sodium: 136 mmol/L (ref 135–145)
Total Bilirubin: 1.4 mg/dL — ABNORMAL HIGH (ref 0.3–1.2)
Total Protein: 7.4 g/dL (ref 6.5–8.1)

## 2022-09-28 LAB — URINALYSIS, W/ REFLEX TO CULTURE (INFECTION SUSPECTED)
Bilirubin Urine: NEGATIVE
Glucose, UA: NEGATIVE mg/dL
Hgb urine dipstick: NEGATIVE
Ketones, ur: NEGATIVE mg/dL
Nitrite: NEGATIVE
Protein, ur: 100 mg/dL — AB
Specific Gravity, Urine: 1.017 (ref 1.005–1.030)
Squamous Epithelial / HPF: NONE SEEN /HPF (ref 0–5)
pH: 9 — ABNORMAL HIGH (ref 5.0–8.0)

## 2022-09-28 LAB — CBC WITH DIFFERENTIAL/PLATELET
Abs Immature Granulocytes: 0.06 10*3/uL (ref 0.00–0.07)
Basophils Absolute: 0 10*3/uL (ref 0.0–0.1)
Basophils Relative: 0 %
Eosinophils Absolute: 0.1 10*3/uL (ref 0.0–0.5)
Eosinophils Relative: 1 %
HCT: 41.6 % (ref 39.0–52.0)
Hemoglobin: 13.3 g/dL (ref 13.0–17.0)
Immature Granulocytes: 1 %
Lymphocytes Relative: 6 %
Lymphs Abs: 0.5 10*3/uL — ABNORMAL LOW (ref 0.7–4.0)
MCH: 26.6 pg (ref 26.0–34.0)
MCHC: 32 g/dL (ref 30.0–36.0)
MCV: 83.2 fL (ref 80.0–100.0)
Monocytes Absolute: 0.7 10*3/uL (ref 0.1–1.0)
Monocytes Relative: 8 %
Neutro Abs: 8.1 10*3/uL — ABNORMAL HIGH (ref 1.7–7.7)
Neutrophils Relative %: 84 %
Platelets: 184 10*3/uL (ref 150–400)
RBC: 5 MIL/uL (ref 4.22–5.81)
RDW: 15.7 % — ABNORMAL HIGH (ref 11.5–15.5)
WBC: 9.4 10*3/uL (ref 4.0–10.5)
nRBC: 0 % (ref 0.0–0.2)

## 2022-09-28 LAB — BRAIN NATRIURETIC PEPTIDE: B Natriuretic Peptide: 381.8 pg/mL — ABNORMAL HIGH (ref 0.0–100.0)

## 2022-09-28 MED ORDER — MIRABEGRON ER 50 MG PO TB24
50.0000 mg | ORAL_TABLET | Freq: Every day | ORAL | Status: DC
  Administered 2022-09-29 – 2022-10-13 (×15): 50 mg via ORAL
  Filled 2022-09-28 (×15): qty 1

## 2022-09-28 MED ORDER — POTASSIUM CHLORIDE 20 MEQ PO PACK: 40.0000 meq | PACK | Freq: Every day | ORAL | Status: DC

## 2022-09-28 MED ORDER — POTASSIUM CHLORIDE CRYS ER 20 MEQ PO TBCR: 20.0000 meq | EXTENDED_RELEASE_TABLET | Freq: Every day | ORAL | Status: DC

## 2022-09-28 MED ORDER — TRAMADOL HCL 50 MG PO TABS
50.0000 mg | ORAL_TABLET | Freq: Three times a day (TID) | ORAL | Status: DC | PRN
  Administered 2022-10-02 – 2022-10-09 (×3): 50 mg via ORAL
  Filled 2022-09-28 (×3): qty 1

## 2022-09-28 MED ORDER — PANTOPRAZOLE SODIUM 40 MG PO TBEC
40.0000 mg | DELAYED_RELEASE_TABLET | Freq: Every day | ORAL | Status: DC
  Administered 2022-09-29 – 2022-10-13 (×15): 40 mg via ORAL
  Filled 2022-09-28 (×15): qty 1

## 2022-09-28 MED ORDER — POLYETHYLENE GLYCOL 3350 17 G PO PACK
17.0000 g | PACK | Freq: Every day | ORAL | Status: DC | PRN
  Administered 2022-10-07: 17 g via ORAL
  Filled 2022-09-28: qty 1

## 2022-09-28 MED ORDER — ENOXAPARIN SODIUM 40 MG/0.4ML IJ SOSY
40.0000 mg | PREFILLED_SYRINGE | INTRAMUSCULAR | Status: DC
  Administered 2022-09-29 – 2022-10-13 (×15): 40 mg via SUBCUTANEOUS
  Filled 2022-09-28 (×15): qty 0.4

## 2022-09-28 MED ORDER — ACETAMINOPHEN 500 MG PO TABS
500.0000 mg | ORAL_TABLET | Freq: Four times a day (QID) | ORAL | Status: DC | PRN
  Administered 2022-09-29 – 2022-10-10 (×5): 500 mg via ORAL
  Filled 2022-09-28 (×5): qty 1

## 2022-09-28 MED ORDER — FINASTERIDE 5 MG PO TABS
5.0000 mg | ORAL_TABLET | Freq: Every day | ORAL | Status: DC
  Administered 2022-09-29 – 2022-10-13 (×15): 5 mg via ORAL
  Filled 2022-09-28 (×15): qty 1

## 2022-09-28 MED ORDER — ATORVASTATIN CALCIUM 20 MG PO TABS
40.0000 mg | ORAL_TABLET | Freq: Every day | ORAL | Status: DC
  Administered 2022-09-29 – 2022-10-13 (×15): 40 mg via ORAL
  Filled 2022-09-28 (×15): qty 2

## 2022-09-28 MED ORDER — POTASSIUM CHLORIDE 10 MEQ/100ML IV SOLN
10.0000 meq | INTRAVENOUS | Status: AC
  Administered 2022-09-28 – 2022-09-29 (×4): 10 meq via INTRAVENOUS
  Filled 2022-09-28 (×4): qty 100

## 2022-09-28 MED ORDER — SODIUM CHLORIDE 0.9 % IV SOLN
2.0000 g | INTRAVENOUS | Status: DC
  Administered 2022-09-28 – 2022-09-30 (×3): 2 g via INTRAVENOUS
  Filled 2022-09-28: qty 2
  Filled 2022-09-28 (×2): qty 20

## 2022-09-28 MED ORDER — DONEPEZIL HCL 5 MG PO TABS
10.0000 mg | ORAL_TABLET | Freq: Every day | ORAL | Status: DC
  Administered 2022-09-29 – 2022-10-12 (×14): 10 mg via ORAL
  Filled 2022-09-28 (×14): qty 2

## 2022-09-28 MED ORDER — GABAPENTIN 300 MG PO CAPS
300.0000 mg | ORAL_CAPSULE | Freq: Every day | ORAL | Status: DC
  Administered 2022-09-29 – 2022-10-12 (×14): 300 mg via ORAL
  Filled 2022-09-28 (×14): qty 1

## 2022-09-28 MED ORDER — ASPIRIN 81 MG PO TBEC
81.0000 mg | DELAYED_RELEASE_TABLET | Freq: Every day | ORAL | Status: DC
  Administered 2022-09-29 – 2022-10-13 (×15): 81 mg via ORAL
  Filled 2022-09-28 (×18): qty 1

## 2022-09-28 MED ORDER — CYANOCOBALAMIN 500 MCG PO TABS
1000.0000 ug | ORAL_TABLET | Freq: Every day | ORAL | Status: DC
  Administered 2022-09-29 – 2022-10-13 (×15): 1000 ug via ORAL
  Filled 2022-09-28 (×15): qty 2

## 2022-09-28 MED ORDER — SODIUM CHLORIDE 0.9 % IV BOLUS (SEPSIS)
500.0000 mL | Freq: Once | INTRAVENOUS | Status: AC
  Administered 2022-09-28: 500 mL via INTRAVENOUS

## 2022-09-28 MED ORDER — SENNOSIDES-DOCUSATE SODIUM 8.6-50 MG PO TABS: 1.0000 | ORAL_TABLET | Freq: Two times a day (BID) | ORAL | Status: DC | PRN

## 2022-09-28 MED ORDER — TORSEMIDE 20 MG PO TABS: 20.0000 mg | ORAL_TABLET | Freq: Every day | ORAL | Status: DC

## 2022-09-28 MED ORDER — METOPROLOL SUCCINATE ER 25 MG PO TB24
25.0000 mg | ORAL_TABLET | Freq: Every day | ORAL | Status: DC
  Administered 2022-09-30 – 2022-10-04 (×5): 25 mg via ORAL
  Filled 2022-09-28 (×6): qty 1

## 2022-09-28 MED ORDER — LACTATED RINGERS IV SOLN: INTRAVENOUS | Status: DC

## 2022-09-28 NOTE — Subjective & Objective (Signed)
Adam Rios, an 82 y/o with a medical history significant of recurrent UTIs, cognitive impairment, history of CVA, essential hypertension, renal cell carcinoma, neurogenic bladder, chronic indwelling Foley catheter, some dementia, insomnia, chronic diastolic heart failure and history of right lower extremity cellulitis. He has had a PPM placed and 09/08/22 had a cardiomems implant via RHC to the pulmonary artery for assist in management of HFpEF.    Patient has been living at home at his daughter's and has been doing well: eating, ambulating, cognating. Today he had a sudden change in condition: he had an episode of slumping over on the shower chair, staring off into space and being pretty much unresponsive. He was also weaker, unable to ambulate unassisted. Due to his change in condition he was brought to [facility] for further evaluation.

## 2022-09-28 NOTE — Assessment & Plan Note (Signed)
Patient presents with tachycardia, hypotension at 93/72, renal insufficiency. EDP initiated code sepsis - patient received 500 cc NS bolus and LR at 150cc/hr, Rocephin 2 g. U/A returned with many bacteria, 21-50 WBC/hpf.  Plan Continue Rocephin 2 g q 24  x 5 days

## 2022-09-28 NOTE — ED Provider Notes (Signed)
[facility] Provider Note   Event Date/Time   First MD Initiated Contact with Patient 09/28/22 2108     (approximate) History  Altered Mental Status  HPI Adam Rios. is a 82 y.o. male with a past medical history of CHF and recurrent urinary tract infections who presents after an episode of altered mental status in which family found patient staring and unresponsive for an unknown period of time prior to returning to relative baseline.  Patient denies any complaints at this time ROS: Patient currently denies any vision changes, tinnitus, difficulty speaking, facial droop, sore throat, chest pain, shortness of breath, abdominal pain, nausea/vomiting/diarrhea, dysuria, or weakness/numbness/paresthesias in any extremity   Physical Exam  Triage Vital Signs: ED Triage Vitals  Enc Vitals Group     BP      Pulse      Resp      Temp      Temp src      SpO2      Weight      Height      Head Circumference      Peak Flow      Pain Score      Pain Loc      Pain Edu?      Excl. in GC?    Most recent vital signs: Vitals:   09/28/22 2112 09/28/22 2200  BP: 93/72 108/61  Pulse: (!) 131 (!) 111  Resp: 13 19  Temp: 98.2 F (36.8 C)   SpO2: 92% 91%   General: Awake, oriented x4. CV:  Good peripheral perfusion.  Resp:  Normal effort.  Abd:  No distention.  Other:  Elderly overweight Caucasian male laying in bed in no acute distress ED Results / Procedures / Treatments  Labs (all labs ordered are listed, but only abnormal results are displayed) Labs Reviewed  COMPREHENSIVE METABOLIC PANEL - Abnormal; Notable for the following components:      Result Value   Potassium 2.5 (*)    Chloride 97 (*)    Glucose, Bld 172 (*)    BUN 27 (*)    Creatinine, Ser 1.47 (*)    Calcium 8.5 (*)    Total Bilirubin 1.4 (*)    GFR, Estimated 47 (*)    All other components within normal limits  CBC WITH DIFFERENTIAL/PLATELET - Abnormal; Notable for the following  components:   RDW 15.7 (*)    Neutro Abs 8.1 (*)    Lymphs Abs 0.5 (*)    All other components within normal limits  URINALYSIS, W/ REFLEX TO CULTURE (INFECTION SUSPECTED) - Abnormal; Notable for the following components:   Color, Urine AMBER (*)    APPearance TURBID (*)    pH 9.0 (*)    Protein, ur 100 (*)    Leukocytes,Ua LARGE (*)    Bacteria, UA MANY (*)    All other components within normal limits  BRAIN NATRIURETIC PEPTIDE - Abnormal; Notable for the following components:   B Natriuretic Peptide 381.8 (*)    All other components within normal limits  URINE CULTURE   EKG ED ECG REPORT I, Merwyn Katos, the attending physician, personally viewed and interpreted this ECG. Date: 09/28/2022 EKG Time: 2127 Rate: 127 Rhythm: Atrial fibrillation with nonspecific IVCD and LAD QRS Axis: normal Intervals: normal ST/T Wave abnormalities: normal Narrative Interpretation: Atrial fibrillation with IVCD and LAD no evidence of acute ischemia PROCEDURES: Critical Care performed: No .1-3 Lead EKG Interpretation  Performed by: Donna Bernard  K, MD Authorized by: Merwyn Katos, MD     Interpretation: abnormal     ECG rate:  110   ECG rate assessment: tachycardic     Rhythm: atrial fibrillation     Ectopy: none     Conduction: normal    MEDICATIONS ORDERED IN ED: Medications  lactated ringers infusion ( Intravenous New Bag/Given 09/28/22 2232)  sodium chloride 0.9 % bolus 500 mL (500 mLs Intravenous New Bag/Given 09/28/22 2230)  cefTRIAXone (ROCEPHIN) 2 g in sodium chloride 0.9 % 100 mL IVPB (2 g Intravenous New Bag/Given 09/28/22 2230)  potassium chloride 10 mEq in 100 mL IVPB (10 mEq Intravenous New Bag/Given 09/28/22 2230)  potassium chloride (KLOR-CON) packet 40 mEq (has no administration in time range)   IMPRESSION / MDM / ASSESSMENT AND PLAN / ED COURSE  I reviewed the triage vital signs and the nursing notes.                             The patient is on the cardiac monitor  to evaluate for evidence of arrhythmia and/or significant heart rate changes. Patient's presentation is most consistent with acute presentation with potential threat to life or bodily function. Patient presents for altered mental status of unknown origin  Will obtain medical workup and discuss with social work to try to obtain collateral information.  Given History, Physical, and Workup there is no overt concern for a dangerous emergent cause such as, but not limited to, CNS infection, severe Toxidrome, severe metabolic derangement, or stroke.  Patient does show signs of urinary tract infection and will receive empiric antibiotics.  Patient also shows signs of significant hypokalemia with potassium of 2.5 that will be repleted with oral and p.o. potassium.  Disposition: Admit; the patient is suffering altered mental status that is persistent and therefore they will be admitted.   FINAL CLINICAL IMPRESSION(S) / ED DIAGNOSES   Final diagnoses:  Altered mental status, unspecified altered mental status type  Lower urinary tract infectious disease  Hypokalemia   Rx / DC Orders   ED Discharge Orders     None      Note:  This document was prepared using Dragon voice recognition software and may include unintentional dictation errors.   Merwyn Katos, MD 09/28/22 [phone]

## 2022-09-28 NOTE — Progress Notes (Signed)
CODE SEPSIS - PHARMACY COMMUNICATION  **Broad Spectrum Antibiotics should be administered within 1 hour of Sepsis diagnosis**  Time Code Sepsis Called/Page Received: 2217  Antibiotics Ordered: Ceftriaxone  Time of 1st antibiotic administration: 2230  Otelia Sergeant, PharmD, [facility] 09/28/2022 11:14 PM

## 2022-09-28 NOTE — Assessment & Plan Note (Signed)
Patient followed at [facility] heart failure clinic. He has been doing relatively well on his medical regimen. He has had several admission for decompensation of HFpEF.  Last echo 04/30/22 with EF 50-55%, grade I DD. On March 4th he was stable, K = 4.4, classified as NYHA III heart failure. 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 Evaluation revealed him to have no respiratory distress, lungs CTAP, BNP mildly elevated at 381.  Plan Continue home medical regimen  Careful rehydration  Daily weights

## 2022-09-28 NOTE — ED Triage Notes (Signed)
Pt BIB EMS from home, hx dementia, family states pt went unresponsive ("blank stare,") family states history chronic UTI's, CVA 2019, CHF,  CBG 196. 97.8 F HR 120's

## 2022-09-28 NOTE — ED Notes (Signed)
Reported K+ of 2.5 to Dr. Vicente Males

## 2022-09-28 NOTE — Assessment & Plan Note (Signed)
Patient with h/o CVA in the past. Had an event at home with loss of muscle tone, absence behavior and inability to ambulate. His symptoms did improve by the time of EDP eval. Patient verbal, follows commands and has no focal findings on physical exam. 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  Plan CT head - no acute abnormality  MRI brain if possible - patient has pacemaker and cardiomems implant. MRI cannot be done at [facility] - would need transfer to [facility]. Given absence of acute symptoms at this time will defer study and reconsider after re-evaluation 09/29/22.  No indication for acute neurology consult as symptoms have resolve  Continue aspirin, add plavix 75 mg daily

## 2022-09-28 NOTE — Assessment & Plan Note (Signed)
Chronic problem. He has indwelling foley catheter.  Plan Continue home meds

## 2022-09-28 NOTE — Assessment & Plan Note (Signed)
EKG at presentation machine read as Atrial Fib with RVR to 127. No prior h/o a. Fib. Pulse is regular. He has PPM in place. Question if patient in sinus tachycardia 2/2 dehydration and sepsis. He is taking ARB and BB.  Plan No change in medical regimen at this time  Medical tele admit  F/u EKG 09/29/22 after rehydration. If patient in a. Fib will need CCB and cardiology consult

## 2022-09-29 ENCOUNTER — Encounter: Payer: Self-pay | Admitting: Internal Medicine

## 2022-09-29 ENCOUNTER — Inpatient Hospital Stay: Payer: Medicare Other

## 2022-09-29 DIAGNOSIS — A419 Sepsis, unspecified organism: Secondary | ICD-10-CM

## 2022-09-29 DIAGNOSIS — N390 Urinary tract infection, site not specified: Secondary | ICD-10-CM | POA: Diagnosis not present

## 2022-09-29 DIAGNOSIS — G9341 Metabolic encephalopathy: Secondary | ICD-10-CM | POA: Diagnosis present

## 2022-09-29 LAB — CBC
HCT: 40.1 % (ref 39.0–52.0)
Hemoglobin: 12.9 g/dL — ABNORMAL LOW (ref 13.0–17.0)
MCH: 26.9 pg (ref 26.0–34.0)
MCHC: 32.2 g/dL (ref 30.0–36.0)
MCV: 83.7 fL (ref 80.0–100.0)
Platelets: 177 10*3/uL (ref 150–400)
RBC: 4.79 MIL/uL (ref 4.22–5.81)
RDW: 15.7 % — ABNORMAL HIGH (ref 11.5–15.5)
WBC: 10 10*3/uL (ref 4.0–10.5)
nRBC: 0 % (ref 0.0–0.2)

## 2022-09-29 LAB — BASIC METABOLIC PANEL
Anion gap: 12 (ref 5–15)
BUN: 24 mg/dL — ABNORMAL HIGH (ref 8–23)
CO2: 25 mmol/L (ref 22–32)
Calcium: 8.5 mg/dL — ABNORMAL LOW (ref 8.9–10.3)
Chloride: 97 mmol/L — ABNORMAL LOW (ref 98–111)
Creatinine, Ser: 1.34 mg/dL — ABNORMAL HIGH (ref 0.61–1.24)
GFR, Estimated: 53 mL/min — ABNORMAL LOW (ref >60)
Glucose, Bld: 108 mg/dL — ABNORMAL HIGH (ref 70–99)
Potassium: 2.9 mmol/L — ABNORMAL LOW (ref 3.5–5.1)
Sodium: 134 mmol/L — ABNORMAL LOW (ref 135–145)

## 2022-09-29 LAB — LACTIC ACID, PLASMA
Lactic Acid, Venous: 1.9 mmol/L (ref 0.5–1.9)
Lactic Acid, Venous: 1.6 mmol/L (ref 0.5–1.9)

## 2022-09-29 LAB — CULTURE, BLOOD (ROUTINE X 2)

## 2022-09-29 LAB — MAGNESIUM: Magnesium: 1.8 mg/dL (ref 1.7–2.4)

## 2022-09-29 MED ORDER — MAGNESIUM SULFATE 2 GM/50ML IV SOLN
2.0000 g | Freq: Once | INTRAVENOUS | Status: AC
  Administered 2022-09-29: 2 g via INTRAVENOUS
  Filled 2022-09-29: qty 50

## 2022-09-29 MED ORDER — LACTATED RINGERS IV SOLN: INTRAVENOUS | Status: DC

## 2022-09-29 MED ORDER — RINGERS IV SOLN: INTRAVENOUS | Status: DC

## 2022-09-29 MED ORDER — PHENOL 1.4 % MT LIQD: 1.0000 | OROMUCOSAL | Status: DC | PRN

## 2022-09-29 MED ORDER — POTASSIUM CHLORIDE CRYS ER 20 MEQ PO TBCR: 20.0000 meq | EXTENDED_RELEASE_TABLET | Freq: Every day | ORAL | Status: DC

## 2022-09-29 MED ORDER — POTASSIUM CHLORIDE 20 MEQ PO PACK
40.0000 meq | PACK | ORAL | Status: AC
  Administered 2022-09-29 (×2): 40 meq via ORAL
  Filled 2022-09-29 (×2): qty 2

## 2022-09-29 MED ORDER — CLOPIDOGREL BISULFATE 75 MG PO TABS
75.0000 mg | ORAL_TABLET | Freq: Every day | ORAL | Status: DC
  Administered 2022-09-29 – 2022-10-13 (×15): 75 mg via ORAL
  Filled 2022-09-29 (×15): qty 1

## 2022-09-29 MED ORDER — CHLORHEXIDINE GLUCONATE CLOTH 2 % EX PADS
6.0000 | MEDICATED_PAD | Freq: Every day | CUTANEOUS | Status: DC
  Administered 2022-09-29 – 2022-10-13 (×15): 6 via TOPICAL

## 2022-09-29 NOTE — Assessment & Plan Note (Signed)
Patient presented with tachycardia, hypotension, renal insufficiency, positive U/A. Code sepsis initiated by EDP. Patient received NS bolus - 500cc and currently LR at 150/hr. Rocephin 2 g IV administered in ED.  Plan Medical tele admit  Continue Abx

## 2022-09-29 NOTE — Assessment & Plan Note (Signed)
Stable chronic problem  Plan  Continue PPI

## 2022-09-29 NOTE — H&P (Signed)
History and Physical    Adam Rios. WGN:[id number] DOB: 13-Nov-1940 DOA: 09/28/2022  DOS: the patient was seen and examined on 09/28/2022  PCP: Laqueta Due, MD   Patient coming from: Home  I have personally briefly reviewed patient's old medical records in [facility]  [name], an 82 y/o with a medical history significant of recurrent UTIs, cognitive impairment, history of CVA, essential hypertension, renal cell carcinoma, neurogenic bladder, chronic indwelling Foley catheter, some dementia, insomnia, chronic diastolic heart failure and history of right lower extremity cellulitis. He has had a PPM placed and 09/08/22 had a cardiomems implant via RHC to the pulmonary artery for assist in management of HFpEF.    Patient has been living at home at his daughter's and has been doing well: eating, ambulating, cognating. Today he had a sudden change in condition: he had an episode of slumping over on the shower chair, staring off into space and being pretty much unresponsive. He was also weaker, unable to ambulate unassisted. Due to his change in condition he was brought to [facility] for further evaluation.    ED Course: T 98.2  BP 93/72 to 108/61 after fluid bolus  HR 120-130 R 19. Patient appeared ill. He was cogent and had no focal neurologic finding on EDP exam. Lab - K 2.5 glucose 172  BUN/CR 27/1.47 (08/26/22 14/1.12). WBC 9.4 withy 84/6/8 Hgb 13.3, BNP 381 U/A turbid, LE large, many bacteria/hpf, 21-50 WBC/hpf. In ED code sepsis initiated: NS bolus 500cc, Rocephin 2g given. K 10 meq IV runs x 4. TRH called to admit for continued evaluation and management of UTI with sepsis, possible CVA, tachycardia.   Review of Systems: caveat - patient with underlying dementia and poor historian Review of Systems  Constitutional:  Negative for chills, fever and weight loss.  HENT: Negative.    Eyes: Negative.   Respiratory:  Negative for cough and shortness of breath.   Cardiovascular:  Negative  for chest pain and palpitations.  Gastrointestinal: Negative.   Genitourinary: Negative.   Musculoskeletal: Negative.   Skin:        H/o decubitus at sacrum. Very dry skin both feet - chronic  Neurological:  Positive for weakness. Negative for loss of consciousness.  Psychiatric/Behavioral: Negative.      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HCC)    Hypertension    Neurogenic bladder    Renal cell carcinoma (HCC) 2005   Renal disorder    Stroke (HCC)     01/17/18, 12/21   Some weakness and cognitive decline    Past Surgical History:  Procedure Laterality Date   APPENDECTOMY     CATARACT EXTRACTION W/PHACO Right 07/22/2020   Procedure: CATARACT EXTRACTION PHACO AND INTRAOCULAR LENS PLACEMENT (IOC) RIGHT;  Surgeon: Nevada Crane, MD;  Location: [facility];  Service: Ophthalmology;  Laterality: Right;  4.77 0:44.2   CATARACT EXTRACTION W/PHACO Left 08/05/2020   Procedure: CATARACT EXTRACTION PHACO AND INTRAOCULAR LENS PLACEMENT (IOC) LEFT 10.73 01:13.8;  Surgeon: Nevada Crane, MD;  Location: [facility];  Service: Ophthalmology;  Laterality: Left;  Use eye stretcher not chair   HERNIA REPAIR     LUMBAR FUSION     NEPHRECTOMY Left 2005   TEMPORARY PACEMAKER N/A 05/03/2022   Procedure: TEMPORARY PACEMAKER;  Surgeon: Alwyn Pea, MD;  Location: [facility];  Service:  Cardiovascular;  Laterality: N/A;   Soc Hx - widowed. Lives with his daughter, her husband and two children. Retired - worked as a Landscape architect.    reports that he quit smoking about 19 years ago. His smoking use included cigarettes. He has a 2.50 pack-year smoking history. He has never used smokeless tobacco. He reports that he does not currently use alcohol. He reports that he does not use drugs.  Allergies  Allergen Reactions   Empagliflozin      Recurrent UTI with sepsis on jardiance.   Fentanyl Nausea Only   Oxycodone Nausea And Vomiting    Family History  Problem Relation Age of Onset   Cerebral aneurysm Father    Cerebral aneurysm Sister     Prior to Admission medications   Medication Sig Start Date End Date Taking? Authorizing Provider  acetaminophen (TYLENOL) 325 MG tablet Take 500 mg by mouth every 6 (six) hours as needed. 08/14/14   [provider]  aspirin 81 MG EC tablet Take 81 mg by mouth daily as needed.    [provider]  atorvastatin (LIPITOR) 40 MG tablet Take 40 mg by mouth daily.    [provider]  clopidogrel (PLAVIX) 75 MG tablet Take 75 mg by mouth daily. 09/08/22   [provider]  cyclobenzaprine (FLEXERIL) 5 MG tablet Take 5-10 mg by mouth 3 (three) times daily as needed for muscle spasms. 07/30/22   [provider]  diclofenac Sodium (VOLTAREN) 1 % GEL Apply 2 g topically 2 (two) times daily as needed. 05/11/21   [provider]  donepezil (ARICEPT) 10 MG tablet Take 10 mg by mouth at bedtime.    [provider]  finasteride (PROSCAR) 5 MG tablet Take 5 mg by mouth daily. 04/27/22   [provider]  gabapentin (NEURONTIN) 300 MG capsule Take 1 capsule (300 mg total) by mouth at bedtime. 06/14/22   Susa Griffins, MD  hydrocortisone cream ([employer] CORTISONE MAXIMUM STRENGTH) 1 % Apply 1 Application topically as needed for itching.    [provider]  metoprolol succinate (TOPROL-XL) 25 MG 24 hr tablet Take 25 mg by mouth daily. 08/03/22 08/03/23  [provider]  mirabegron ER (MYRBETRIQ) 50 MG TB24 tablet Take 1 tablet (50 mg total) by mouth daily. 08/28/22   Wouk, Wilfred Curtis, MD  omeprazole (PRILOSEC) 20 MG capsule Take 1 capsule (20 mg total) by mouth daily. 05/21/21   Lurene Shadow, MD  polyethylene glycol (MIRALAX / GLYCOLAX) 17 g packet Take 17 g by mouth daily as needed for severe constipation. 08/15/22   Alford Highland, MD  potassium chloride SA (KLOR-CON M) 20 MEQ tablet Take 1 tablet (20 mEq total) by mouth daily. 08/15/22   Alford Highland, MD  senna-docusate (SENOKOT-S) 8.6-50 MG tablet Take 1 tablet by mouth 2 (two) times daily as needed. 08/15/22   Alford Highland, MD  torsemide (DEMADEX) 20 MG tablet Take 1 tablet (20 mg total) by mouth daily. 08/28/22   Wouk, Wilfred Curtis, MD  vitamin B-12 (CYANOCOBALAMIN) 1000 MCG tablet Take 1,000 mcg by mouth daily.    [provider]    Physical Exam: Vitals:   09/28/22 2120 09/28/22 2200 09/28/22 2300 09/29/22 0000  BP:  108/61 96/71 (!) 86/65  Pulse:  (!) 111 (!) 114 (!) 118  Resp:  19 16   Temp:      TempSrc:      SpO2:  91% 95% 94%  Weight: 101.2 kg     Height:  6\' 1"  (1.854 m)       Physical Exam Constitutional:      General: He is not in acute distress.    Appearance: He is not toxic-appearing.     Comments: Awake, follows commands. Appears dissheveled. In no acute distress.   HENT:     Head: Normocephalic and atraumatic.     Mouth/Throat:     Mouth: Mucous membranes are dry.     Pharynx: No oropharyngeal exudate.  Eyes:     Extraocular Movements: Extraocular movements intact.     Conjunctiva/sclera: Conjunctivae normal.     Pupils: Pupils are equal, round, and reactive to light.  Neck:     Vascular: No carotid bruit.  Cardiovascular:     Rate and Rhythm: Regular rhythm. Tachycardia present.     Heart sounds: No murmur heard.    No gallop.     Comments: Quiet precordium. Heart sounds very distant -increased AP diameter chest- practically inaudible. 2+ radial pulses, 2+ right DP pulse, no palpable pulse left DP. Left foot with palor, slow capillary refill toes (2-3 secs). 2+ femoral pulse left.  Pulmonary:     Effort: Pulmonary effort is normal.     Breath sounds: Normal breath sounds. No wheezing or rales.  Abdominal:     General: Bowel sounds are normal.     Palpations: Abdomen is soft.     Tenderness: There is no  abdominal tenderness. There is no guarding.     Comments: Ventral surgical scar  Genitourinary:    Comments: Foley catheter in place Musculoskeletal:        General: No swelling or deformity.     Cervical back: Neck supple. No rigidity.     Right lower leg: No edema.     Left lower leg: No edema.  Lymphadenopathy:     Cervical: No cervical adenopathy.  Skin:    General: Skin is warm and dry.     Capillary Refill: Capillary refill takes 2 to 3 seconds.     Coloration: Skin is not jaundiced.     Findings: No bruising or lesion.     Comments: No lesions noted left foot. Mildly erythematous skin at sacrum w/o open ulcer. Dry scaly skin both feet.   Neurological:     General: No focal deficit present.     Cranial Nerves: No cranial nerve deficit.     Comments: Patient awake, alert to location, person. Clear speech. CN- no facial asymmetry, nl facial muscle movement, no deviation of tongue, PERRLA, EOMI MS- able to move to command. Generalized weakness but no focal weakness.   Psychiatric:        Mood and Affect: Mood normal.      Labs on Admission: I have personally reviewed following labs and imaging studies  CBC: Recent Labs  Lab 09/28/22 2130  WBC 9.4  NEUTROABS 8.1*  HGB 13.3  HCT 41.6  MCV 83.2  PLT 184   Basic Metabolic Panel: Recent Labs  Lab 09/28/22 2130  NA 136  K 2.5*  CL 97*  CO2 26  GLUCOSE 172*  BUN 27*  CREATININE 1.47*  CALCIUM 8.5*   GFR: Estimated Creatinine Clearance: 48.4 mL/min (A) (by C-G formula based on SCr of 1.47 mg/dL (H)). Liver Function Tests: Recent Labs  Lab 09/28/22 2130  AST 25  ALT 16  ALKPHOS 93  BILITOT 1.4*  PROT 7.4  ALBUMIN 3.7   No results for input(s): "LIPASE", "AMYLASE" in the last 168 hours. No results for input(s): "  AMMONIA" in the last 168 hours. Coagulation Profile: No results for input(s): "INR", "PROTIME" in the last 168 hours. Cardiac Enzymes: No results for input(s): "CKTOTAL", "CKMB", "CKMBINDEX",  "TROPONINI" in the last 168 hours. BNP (last 3 results) No results for input(s): "PROBNP" in the last 8760 hours. HbA1C: No results for input(s): "HGBA1C" in the last 72 hours. CBG: No results for input(s): "GLUCAP" in the last 168 hours. Lipid Profile: No results for input(s): "CHOL", "HDL", "LDLCALC", "TRIG", "CHOLHDL", "LDLDIRECT" in the last 72 hours. Thyroid Function Tests: No results for input(s): "TSH", "T4TOTAL", "FREET4", "T3FREE", "THYROIDAB" in the last 72 hours. Anemia Panel: No results for input(s): "VITAMINB12", "FOLATE", "FERRITIN", "TIBC", "IRON", "RETICCTPCT" in the last 72 hours. Urine analysis:    Component Value Date/Time   COLORURINE AMBER (A) 09/28/2022 2130   APPEARANCEUR TURBID (A) 09/28/2022 2130   LABSPEC 1.017 09/28/2022 2130   PHURINE 9.0 (H) 09/28/2022 2130   GLUCOSEU NEGATIVE 09/28/2022 2130   HGBUR NEGATIVE 09/28/2022 2130   BILIRUBINUR NEGATIVE 09/28/2022 2130   KETONESUR NEGATIVE 09/28/2022 2130   PROTEINUR 100 (A) 09/28/2022 2130   NITRITE NEGATIVE 09/28/2022 2130   LEUKOCYTESUR LARGE (A) 09/28/2022 2130    Radiological Exams on Admission: I have personally reviewed images CT Head Wo Contrast  Result Date: 09/28/2022 CLINICAL DATA:  Mental status change, unknown cause EXAM: CT HEAD WITHOUT CONTRAST TECHNIQUE: Contiguous axial images were obtained from the base of the skull through the vertex without intravenous contrast. RADIATION DOSE REDUCTION: This exam was performed according to the departmental dose-optimization program which includes automated exposure control, adjustment of the mA and/or kV according to patient size and/or use of iterative reconstruction technique. COMPARISON:  CT head 08/23/2022 FINDINGS: Brain: Stable prominence of the lateral ventricles may be related to central predominant atrophy, although a component of normal pressure/communicating hydrocephalus cannot be excluded. Patchy and confluent areas of decreased attenuation are  noted throughout the deep and periventricular white matter of the cerebral hemispheres bilaterally, compatible with chronic microvascular ischemic disease. No evidence of large-territorial acute infarction. No parenchymal hemorrhage. No mass lesion. No extra-axial collection. No mass effect or midline shift. No hydrocephalus. Basilar cisterns are patent. Vascular: No hyperdense vessel. Atherosclerotic calcifications are present within the cavernous internal carotid and vertebral arteries. Skull: No acute fracture or focal lesion. Sinuses/Orbits: Paranasal sinuses and mastoid air cells are clear. Bilateral lens replacement. Otherwise the orbits are unremarkable. Other: None. IMPRESSION: 1. No acute intracranial abnormality. 2. Stable prominence of the lateral ventricles may be related to central predominant atrophy, although a component of normal pressure/communicating hydrocephalus cannot be excluded. Electronically Signed   By: Tish Frederickson M.D.   On: 09/28/2022 23:29    EKG: I have personally reviewed EKG: machine read as a fib at 127.  Assessment/Plan Principal Problem:   Sepsis secondary to UTI [facility]) Active Problems:   Acute on chronic diastolic CHF (congestive heart failure) (HCC)   Complicated UTI (urinary tract infection)   BPH with obstruction/lower urinary tract symptoms   Essential hypertension   GERD (gastroesophageal reflux disease)   Neurogenic bladder   Heart block AV complete (HCC)   Atrial fibrillation with RVR (HCC)   PAD (peripheral artery disease) (HCC)   CVA (cerebral vascular accident) (HCC)    Assessment and Plan: * Sepsis secondary to UTI (HCC) >>ASSESSMENT AND PLAN FOR SEPSIS (HCC) WRITTEN ON 09/29/2022 12:12 AM BY Saahir Prude E, MD  Patient presented with tachycardia, hypotension, renal insufficiency, positive U/A. Code sepsis initiated by EDP. Patient received NS  bolus - 500cc and currently LR at 150/hr. Rocephin 2 g IV administered in ED.  Plan Medical tele  admit  Continue Abx    Acute on chronic diastolic CHF (congestive heart failure) (HCC) Patient followed at [facility] heart failure clinic. He has been doing relatively well on his medical regimen. He has had several admission for decompensation of HFpEF.  Last echo 04/30/22 with EF 50-55%, grade I DD. On March 4th he was stable, K = 4.4, classified as NYHA III heart failure. 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 Evaluation revealed him to have no respiratory distress, lungs CTAP, BNP mildly elevated at 381.  Plan Continue home medical regimen  Careful rehydration  Daily weights  Complicated UTI (urinary tract infection) Patient presents with tachycardia, hypotension at 93/72, renal insufficiency. EDP initiated code sepsis - patient received 500 cc NS bolus and LR at 150cc/hr, Rocephin 2 g. U/A returned with many bacteria, 21-50 WBC/hpf.  Plan Continue Rocephin 2 g q 24  x 5 days  BPH with obstruction/lower urinary tract symptoms Chronic problem. He has indwelling foley catheter.  Plan Continue home meds  Essential hypertension BP soft at presentation but improved after fluid bolus. AT admission exam BP back down to 90s/70's.  Plan  Continue torsemide, continue BB  IVF at 150/hr at 12 hrs and reassess.   Neurogenic bladder Patient with indwelling foley. Most likely source of abnormal U/A and recurrent infections.   Plan Continue foley catheter. If not changed in > 4 weeks will need to replace.   GERD (gastroesophageal reflux disease) Stable chronic problem  Plan  Continue PPI  CVA (cerebral vascular accident) [facility]) Patient with h/o CVA in the past. Had an event at home with loss of muscle tone, absence behavior and inability to ambulate. His symptoms did improve by the time of EDP eval. Patient verbal, follows commands and has no focal findings on physical exam. 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  Plan CT head - no acute abnormality  MRI brain if possible - patient has pacemaker and cardiomems implant. [facility] cannot be done at [facility] - would need transfer to [facility]. Given absence of acute symptoms at this time will defer study and reconsider after re-evaluation 09/29/22.  No indication for acute neurology consult as symptoms have resolve  Continue aspirin, add plavix 75 mg daily  PAD (peripheral artery disease) (HCC) On exam patient's left foot is very cool to touch from ankle down, no palpable DP pulse, slow (3-4 sec) capillary refill. No skin break down noted.  Plan ABI - can be deferred to outpatient  Plavix  Atrial fibrillation with RVR (HCC) EKG at presentation machine read as Atrial Fib with RVR to 127. No prior h/o a. Fib. Pulse is regular. He has PPM in place. Question if patient in sinus tachycardia 2/2 dehydration and sepsis. He is taking ARB and BB.  Plan No change in medical regimen at this time  Medical tele admit  F/u EKG 09/29/22 after rehydration. If patient in a. Fib will need CCB and cardiology consult  Heart block AV complete (HCC) Patient with PPM. EKG reveals rapid rate, question of A fib vs sinus tach with PACs  Plan See a. Fib above       DVT prophylaxis: Lovenox Code Status: Full Code - discussed with daughter - she states that his wish was for full  code status. She understands his poor prognosis in the event of Cardiac or respiratory arrest.  Family Communication: Dominica Severin - daughter - discussed his dx and tx plan including work up for possible new CVA.  Disposition Plan: TBD  Consults called: none  Admission status: Inpatient, Telemetry bed   Illene Regulus, MD [facility] 09/29/2022, 12:36 AM

## 2022-09-29 NOTE — Assessment & Plan Note (Signed)
Patient with indwelling foley. Most likely source of abnormal U/A and recurrent infections.   Plan Continue foley catheter. If not changed in > 4 weeks will need to replace.

## 2022-09-29 NOTE — Assessment & Plan Note (Signed)
BP soft at presentation but improved after fluid bolus. AT admission exam BP back down to 90s/70's.  Plan  Continue torsemide, continue BB  IVF at 150/hr at 12 hrs and reassess.

## 2022-09-29 NOTE — Progress Notes (Addendum)
Progress Note    Adam Rios.  UJW:[id number] DOB: 08-26-1940  DOA: 09/28/2022 PCP: Laqueta Due, MD      Brief Narrative:    Medical records reviewed and are as summarized below:  Adam Rios. is a 82 y.o. male significant of recurrent UTIs, cognitive impairment, history of CVA, essential hypertension, renal cell carcinoma, neurogenic bladder, chronic indwelling Foley catheter, insomnia, chronic diastolic CHF, history of right lower extremity cellulitis, s/p permanent pacemaker for heart block, CardioMEMS implant via right heart catheter pulmonary artery on 09/08/2022.  He was brought to the hospital because of change in mental status.  Reportedly, he was pretty much unresponsive and was staring into space.  He was tachycardic (heart rate 131), tachypneic (respiratory rate 20), hypotensive (BP 93/72) and hypokalemic (potassium 2.5).  He was found to have severe sepsis secondary to acute complicated UTI in the setting of chronic indwelling catheter, AKI and acute metabolic encephalopathy.      Assessment/Plan:   Principal Problem:   Sepsis secondary to UTI [facility]) Active Problems:   Chronic diastolic CHF (congestive heart failure) (HCC)   Complicated UTI (urinary tract infection)   BPH with obstruction/lower urinary tract symptoms   AKI (acute kidney injury) (HCC)   Essential hypertension   GERD (gastroesophageal reflux disease)   Neurogenic bladder   Atrial fibrillation with RVR (HCC)   PAD (peripheral artery disease) (HCC)   History of stroke   Acute metabolic encephalopathy   Body mass index is 29.42 kg/m.   Severe sepsis secondary to acute complicated UTI in the setting of chronic indwelling catheter: Continue IV ceftriaxone.  Follow-up urine culture. Chest x-ray done today did not show any acute abnormality.   AKI: Improving (down from 1.47-1.34).  Baseline creatinine is around 1.12.  Continue IV fluids but reduce rate to 50 mL/h.  Monitor  BMP   Acute metabolic encephalopathy with underlying cognitive decline: Mental status has improved and he is probably at his baseline   BPH and neurogenic bladder with chronic indwelling Foley catheter: Daughter suspects Foley catheter was last changed about a month ago.  Change Foley catheter.   Hypokalemia: Replete potassium and monitor levels.  Magnesium is low normal at 1.8.  Replete with IV magnesium sulfate.   Sinus tachycardia with PACs: Repeat EKGs did not show any clear evidence of atrial fibrillation.  There was also evidence of atrial-sensed ventricular paced rhythm.   Chronic diastolic CHF: Compensated.  No acute CHF at this time.  Hold torsemide.  2D echo in November 2023 showed EF estimated at 50 to 55%, grade 1 diastolic dysfunction.   CT head on 09/28/2022: No acute intracranial abnormality, stable prominence of the lateral ventricles may be related to central predominant atrophy, although a component of normal pressure/communicating hydrocephalus cannot be excluded.  MRI of the brain can be done in the outpatient setting.  PCP can arrange this.  This was discussed with his daughter.   Other comorbidities include s/p permanent pacemaker placement on December 2023, CardioMEMS implant for CHF monitoring on 09/08/2022, history of stroke, cognitive decline    Diet Order             Diet Heart Room service appropriate? Yes with Assist; Fluid consistency: Thin  Diet effective now                            Consultants: None  Procedures: None    Medications:  aspirin EC  81 mg Oral Daily   atorvastatin  40 mg Oral Daily   Chlorhexidine Gluconate Cloth  6 each Topical Daily   clopidogrel  75 mg Oral Daily   cyanocobalamin  1,000 mcg Oral Daily   donepezil  10 mg Oral QHS   enoxaparin (LOVENOX) injection  40 mg Subcutaneous Q24H   finasteride  5 mg Oral Daily   gabapentin  300 mg Oral QHS   metoprolol succinate  25 mg Oral Daily   mirabegron ER   50 mg Oral Daily   pantoprazole  40 mg Oral Daily   [START ON 09/30/2022] potassium chloride SA  20 mEq Oral Daily   Continuous Infusions:  cefTRIAXone (ROCEPHIN)  IV Stopped (09/28/22 2318)   lactated ringers 50 mL/hr at 09/29/22 1205     Anti-infectives (From admission, onward)    Start     Dose/Rate Route Frequency Ordered Stop   09/28/22 2230  cefTRIAXone (ROCEPHIN) 2 g in sodium chloride 0.9 % 100 mL IVPB        2 g 200 mL/hr over 30 Minutes Intravenous Every 24 hours 09/28/22 2217 10/05/22 2229              Family Communication/Anticipated D/C date and plan/Code Status   DVT prophylaxis: enoxaparin (LOVENOX) injection 40 mg Start: 09/29/22 1000     Code Status: Full Code  Family Communication: Plan discussed with [name], daughter, over the phone Disposition Plan: Plan to discharge home in 1 to 2 days   Status is: Inpatient Remains inpatient appropriate because: IV antibiotics for UTI       Subjective:   Interval events noted.  He has no complaints.  He feels better today.  No abdominal pain or vomiting.  Objective:    Vitals:   09/29/22 0202 09/29/22 0443 09/29/22 0730 09/29/22 1233  BP: 112/64 99/69 99/64  101/66  Pulse: 84 75 (!) 56 (!) 52  Resp: 19 17 15 16   Temp: (!) 97.5 F (36.4 C) 97.7 F (36.5 C) 98.5 F (36.9 C) 99.1 F (37.3 C)  TempSrc: Oral Oral    SpO2: 96% 96% 93% 100%  Weight:      Height:       No data found.   Intake/Output Summary (Last 24 hours) at 09/29/2022 1237 Last data filed at 09/29/2022 1205 Gross per 24 hour  Intake 2298.37 ml  Output 800 ml  Net 1498.37 ml   Filed Weights   09/28/22 2120  Weight: 101.2 kg    Exam:  GEN: NAD SKIN: Warm and dry EYES: No pallor or icterus ENT: MMM CV: RRR PULM: Bibasilar rales, no wheezing ABD: soft, ND, NT, +BS CNS: AAO x 3, non focal EXT: No edema or tenderness GU: Foley catheter draining amber urine       Data Reviewed:   I have personally reviewed  following labs and imaging studies:  Labs: Labs show the following:   Basic Metabolic Panel: Recent Labs  Lab 09/28/22 2130 09/29/22 0245 09/29/22 0534  NA 136 134*  --   K 2.5* 2.9*  --   CL 97* 97*  --   CO2 26 25  --   GLUCOSE 172* 108*  --   BUN 27* 24*  --   CREATININE 1.47* 1.34*  --   CALCIUM 8.5* 8.5*  --   MG  --   --  1.8   GFR Estimated Creatinine Clearance: 53.1 mL/min (A) (by C-G formula based on SCr of 1.34 mg/dL (H)). Liver  Function Tests: Recent Labs  Lab 09/28/22 2130  AST 25  ALT 16  ALKPHOS 93  BILITOT 1.4*  PROT 7.4  ALBUMIN 3.7   No results for input(s): "LIPASE", "AMYLASE" in the last 168 hours. No results for input(s): "AMMONIA" in the last 168 hours. Coagulation profile No results for input(s): "INR", "PROTIME" in the last 168 hours.  CBC: Recent Labs  Lab 09/28/22 2130 09/29/22 0245  WBC 9.4 10.0  NEUTROABS 8.1*  --   HGB 13.3 12.9*  HCT 41.6 40.1  MCV 83.2 83.7  PLT 184 177   Cardiac Enzymes: No results for input(s): "CKTOTAL", "CKMB", "CKMBINDEX", "TROPONINI" in the last 168 hours. BNP (last 3 results) No results for input(s): "PROBNP" in the last 8760 hours. CBG: No results for input(s): "GLUCAP" in the last 168 hours. D-Dimer: No results for input(s): "DDIMER" in the last 72 hours. Hgb A1c: No results for input(s): "HGBA1C" in the last 72 hours. Lipid Profile: No results for input(s): "CHOL", "HDL", "LDLCALC", "TRIG", "CHOLHDL", "LDLDIRECT" in the last 72 hours. Thyroid function studies: No results for input(s): "TSH", "T4TOTAL", "T3FREE", "THYROIDAB" in the last 72 hours.  Invalid input(s): "FREET3" Anemia work up: No results for input(s): "VITAMINB12", "FOLATE", "FERRITIN", "TIBC", "IRON", "RETICCTPCT" in the last 72 hours. Sepsis Labs: Recent Labs  Lab 09/28/22 2130 09/29/22 0245 09/29/22 0540  WBC 9.4 10.0  --   LATICACIDVEN  --  1.9 1.6    Microbiology Recent Results (from the past 240 hour(s))   Culture, blood (Routine X 2) w Reflex to ID Panel     Status: None (Preliminary result)   Collection Time: 09/29/22  2:45 AM   Specimen: BLOOD  Result Value Ref Range Status   Specimen Description BLOOD LEFT HAND  Final   Special Requests   Final    BOTTLES DRAWN AEROBIC ONLY Blood Culture results may not be optimal due to an inadequate volume of blood received in culture bottles   Culture   Final    NO GROWTH < 12 HOURS Performed at [facility], [address], [place], [place] [address]    Report Status PENDING  Incomplete  Culture, blood (Routine X 2) w Reflex to ID Panel     Status: None (Preliminary result)   Collection Time: 09/29/22  2:45 AM   Specimen: BLOOD  Result Value Ref Range Status   Specimen Description BLOOD LEFT HAND  Final   Special Requests   Final    BOTTLES DRAWN AEROBIC AND ANAEROBIC Blood Culture results may not be optimal due to an inadequate volume of blood received in culture bottles   Culture   Final    NO GROWTH < 12 HOURS Performed at [facility], [address], [place], [place] [address]    Report Status PENDING  Incomplete    Procedures and diagnostic studies:  DG Chest Port 1 View  Result Date: 09/29/2022 CLINICAL DATA:  Sepsis EXAM: PORTABLE CHEST 1 VIEW COMPARISON:  08/23/2022 FINDINGS: Single frontal view of the chest demonstrates stable dual lead pacemaker. The cardiac silhouette is unremarkable. Elevated right hemidiaphragm. No airspace disease, effusion, or pneumothorax. No acute bony abnormality. IMPRESSION: 1. No acute intrathoracic process. Electronically Signed   By: Sharlet Salina M.D.   On: 09/29/2022 08:59   CT Head Wo Contrast  Result Date: 09/28/2022 CLINICAL DATA:  Mental status change, unknown cause EXAM: CT HEAD WITHOUT CONTRAST TECHNIQUE: Contiguous axial images were obtained from the base of the skull through the vertex without intravenous  contrast. RADIATION DOSE REDUCTION: This exam was performed  according to the departmental dose-optimization program which includes automated exposure control, adjustment of the mA and/or kV according to patient size and/or use of iterative reconstruction technique. COMPARISON:  CT head 08/23/2022 FINDINGS: Brain: Stable prominence of the lateral ventricles may be related to central predominant atrophy, although a component of normal pressure/communicating hydrocephalus cannot be excluded. Patchy and confluent areas of decreased attenuation are noted throughout the deep and periventricular white matter of the cerebral hemispheres bilaterally, compatible with chronic microvascular ischemic disease. No evidence of large-territorial acute infarction. No parenchymal hemorrhage. No mass lesion. No extra-axial collection. No mass effect or midline shift. No hydrocephalus. Basilar cisterns are patent. Vascular: No hyperdense vessel. Atherosclerotic calcifications are present within the cavernous internal carotid and vertebral arteries. Skull: No acute fracture or focal lesion. Sinuses/Orbits: Paranasal sinuses and mastoid air cells are clear. Bilateral lens replacement. Otherwise the orbits are unremarkable. Other: None. IMPRESSION: 1. No acute intracranial abnormality. 2. Stable prominence of the lateral ventricles may be related to central predominant atrophy, although a component of normal pressure/communicating hydrocephalus cannot be excluded. Electronically Signed   By: Tish Frederickson M.D.   On: 09/28/2022 23:29               LOS: 1 day   Kofi Murrell  [facility]   Pager on [email or web address]. If 7PM-7AM, please contact night-coverage at [email or web address]     09/29/2022, 12:37 PM

## 2022-09-29 NOTE — Sepsis Progress Note (Signed)
Following for sepsis monitoring ?

## 2022-09-29 NOTE — Assessment & Plan Note (Signed)
Patient with PPM. EKG reveals rapid rate, question of A fib vs sinus tach with PACs  Plan See a. Fib above

## 2022-09-29 NOTE — Progress Notes (Signed)
Patient transferred to 103 from ED. Patient alert and oriented x3. Belonings include Blue shirt long sleeve and blue jogging pants. Patient on TELE monitoring  box 103. Patient incontinent of bowels. Peri care and Foley catheter care/chg performed. Foreskin had a thick white odorous discharge noted around penial head. Thick yellowish discharge noted foley catheter tubing. Patient has no order for Foley catheter at this time. Covering NP KP notified.  Foley Chronic for Neurogenic bladder, Patient stated that foley was placed by urology. Stage 1 noted on right and left buttocks, sacrum mepilex applied. Patient is able to turn self. HOB 30, feet elevated. Patient oriented to room. Resting in bed. Call light within reach. Bed alarm on. Needs met at this time.

## 2022-09-29 NOTE — TOC Initial Note (Signed)
Transition of Care (TOC) - Initial/Assessment Note    Patient Details  Name: Adam Rios. MRN: [id number] Date of Birth: July 31, 1940  Transition of Care [facility]) CM/SW Contact:    Allena Katz, LCSW Phone Number: 09/29/2022, 2:25 PM  Clinical Narrative:  Pt admitted from home after becoming unresponsive. Pt recently admitted to [facility] last admission. TOC will continue to follow for care plan and updates. Pt is active with [facility] for home health services.                  Patient Goals and CMS Choice            Expected Discharge Plan and Services                                              Prior Living Arrangements/Services                       Activities of Daily Living Home Assistive Devices/Equipment: None ADL Screening (condition at time of admission) Patient's cognitive ability adequate to safely complete daily activities?: No Is the patient deaf or have difficulty hearing?: No Does the patient have difficulty seeing, even when wearing glasses/contacts?: No Does the patient have difficulty concentrating, remembering, or making decisions?: Yes Patient able to express need for assistance with ADLs?: Yes Does the patient have difficulty dressing or bathing?: Yes Independently performs ADLs?: No Communication: Independent Dressing (OT): Needs assistance Is this a change from baseline?: Pre-admission baseline Grooming: Needs assistance Is this a change from baseline?: Pre-admission baseline Feeding: Independent Bathing: Needs assistance Is this a change from baseline?: Pre-admission baseline Toileting: Needs assistance Is this a change from baseline?: Pre-admission baseline In/Out Bed: Needs assistance Is this a change from baseline?: Pre-admission baseline Walks in Home: Needs assistance Is this a change from baseline?: Pre-admission baseline Does the patient have difficulty walking or climbing stairs?: Yes Weakness of Legs:  Both Weakness of Arms/Hands: None  Permission Sought/Granted                  Emotional Assessment              Admission diagnosis:  Hypokalemia [E87.6] Lower urinary tract infectious disease [N39.0] Sepsis secondary to UTI (HCC) [A41.9, N39.0] Altered mental status, unspecified altered mental status type [R41.82] Patient Active Problem List   Diagnosis Date Noted   Acute metabolic encephalopathy 09/29/2022   Atrial fibrillation with RVR (HCC) 09/28/2022   PAD (peripheral artery disease) (HCC) 09/28/2022   History of stroke 09/28/2022   Sepsis secondary to UTI (HCC) 09/28/2022   Generalized weakness 08/23/2022   Hypokalemia 08/14/2022   AKI (acute kidney injury) (HCC) 08/09/2022   Leukocytosis 08/09/2022   Severe sepsis (HCC) 08/09/2022   Heart failure with preserved ejection fraction (HCC) 08/09/2022   Acute hemorrhagic cystitis 06/11/2022   Iron deficiency anemia due to chronic blood loss 06/11/2022   Dyslipidemia 06/11/2022   GERD without esophagitis 06/11/2022   Lower extremity cellulitis 06/11/2022   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   Second degree heart block 05/02/2022   Acute on chronic congestive heart failure (HCC) 04/30/2022   Chronic diastolic CHF (congestive heart failure) (HCC) 04/30/2022   Obesity (BMI 30-39.9) 04/30/2022   Chronic indwelling Foley catheter 04/30/2022   Septic shock (HCC) 02/11/2022   Pressure injury of skin 02/11/2022   Bacteremia  11/22/2021   Hematuria 11/21/2021   Renal cell carcinoma s/p nephrectomy, left 11/21/2021   History of recurrent UTI and ESBL UTI  11/21/2021   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   Complicated UTI (urinary tract infection) 09/09/2021   Neuropathy 09/09/2021   History of CVA (cerebrovascular accident) 09/09/2021   Fall 05/14/2021   Frailty 03/04/2018   Acute encephalopathy 01/30/2018   GERD (gastroesophageal reflux disease) 04/05/2017   Pain 04/05/2017   Weakness  04/05/2017   Recurrent UTI 01/20/2016   Essential hypertension 07/31/2014   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 04/10/2014   Ventral hernia without obstruction or gangrene 04/10/2014   Acute low back pain without sciatica 12/26/2013   Fusion of spine of lumbar region 12/26/2013   Anasarca 09/26/2013   Hemorrhoid 09/26/2013   Neurogenic bladder 09/26/2013   Renal cell cancer (HCC) 09/26/2013   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   Lower urinary tract infectious disease 10/28/2012   PCP:  Laqueta Due, MD Pharmacy:   [facility] [phone] - [place],  - [address] AT [facility] [address] [place] [address] [place] [address] Phone: [phone] Fax: [phone]     Social Determinants of Health (SDOH) Social History: SDOH Screenings   Food Insecurity: No Food Insecurity (09/29/2022)  Housing: Low Risk  (09/29/2022)  Transportation Needs: No Transportation Needs (09/29/2022)  Utilities: Not At Risk (09/29/2022)  Tobacco Use: Medium Risk (09/29/2022)   SDOH Interventions:     Readmission Risk Interventions    08/11/2022    4:53 PM 06/11/2022    2:19 PM 02/12/2022    2:17 PM  Readmission Risk Prevention Plan  Transportation Screening Complete Complete Complete  PCP or Specialist Appt within 3-5 Days  Complete Complete  HRI or Home Care Consult  Complete Complete  Social Work Consult for Recovery Care Planning/Counseling  Complete Complete  Palliative Care Screening  Not Applicable Not Applicable  Medication Review Oceanographer) Complete Complete Referral to Pharmacy  [facility] or Home Care Consult Complete    Skilled Nursing Facility Complete

## 2022-09-29 NOTE — Assessment & Plan Note (Signed)
On exam patient's left foot is very cool to touch from ankle down, no palpable DP pulse, slow (3-4 sec) capillary refill. No skin break down noted.  Plan ABI - can be deferred to outpatient  Plavix

## 2022-09-29 NOTE — Assessment & Plan Note (Signed)
>>  ASSESSMENT AND PLAN FOR SEPSIS (HCC) WRITTEN ON 09/29/2022 12:12 AM BY Woodward Klem E, MD  Patient presented with tachycardia, hypotension, renal insufficiency, positive U/A. Code sepsis initiated by EDP. Patient received NS bolus - 500cc and currently LR at 150/hr. Rocephin 2 g IV administered in ED.  Plan Medical tele admit  Continue Abx

## 2022-09-30 ENCOUNTER — Inpatient Hospital Stay
Admit: 2022-09-30 | Discharge: 2022-09-30 | Disposition: A | Discharge status: Home / Self Care | Payer: Medicare Other | Attending: Internal Medicine | Admitting: Internal Medicine

## 2022-09-30 DIAGNOSIS — N390 Urinary tract infection, site not specified: Secondary | ICD-10-CM | POA: Diagnosis not present

## 2022-09-30 DIAGNOSIS — A419 Sepsis, unspecified organism: Secondary | ICD-10-CM | POA: Diagnosis not present

## 2022-09-30 LAB — CBC WITH DIFFERENTIAL/PLATELET
Abs Immature Granulocytes: 0.02 10*3/uL (ref 0.00–0.07)
Basophils Absolute: 0 10*3/uL (ref 0.0–0.1)
Basophils Relative: 0 %
Eosinophils Absolute: 0.1 10*3/uL (ref 0.0–0.5)
Eosinophils Relative: 1 %
HCT: 34.2 % — ABNORMAL LOW (ref 39.0–52.0)
Hemoglobin: 10.9 g/dL — ABNORMAL LOW (ref 13.0–17.0)
Immature Granulocytes: 0 %
Lymphocytes Relative: 26 %
Lymphs Abs: 1.5 10*3/uL (ref 0.7–4.0)
MCH: 27 pg (ref 26.0–34.0)
MCHC: 31.9 g/dL (ref 30.0–36.0)
MCV: 84.7 fL (ref 80.0–100.0)
Monocytes Absolute: 0.6 10*3/uL (ref 0.1–1.0)
Monocytes Relative: 11 %
Neutro Abs: 3.6 10*3/uL (ref 1.7–7.7)
Neutrophils Relative %: 62 %
Platelets: 141 10*3/uL — ABNORMAL LOW (ref 150–400)
RBC: 4.04 MIL/uL — ABNORMAL LOW (ref 4.22–5.81)
RDW: 15.9 % — ABNORMAL HIGH (ref 11.5–15.5)
WBC: 5.9 10*3/uL (ref 4.0–10.5)
nRBC: 0 % (ref 0.0–0.2)

## 2022-09-30 LAB — BASIC METABOLIC PANEL WITH GFR
Anion gap: 10 (ref 5–15)
BUN: 16 mg/dL (ref 8–23)
CO2: 21 mmol/L — ABNORMAL LOW (ref 22–32)
Calcium: 8.2 mg/dL — ABNORMAL LOW (ref 8.9–10.3)
Chloride: 100 mmol/L (ref 98–111)
Creatinine, Ser: 1.03 mg/dL (ref 0.61–1.24)
GFR, Estimated: >60 mL/min
Glucose, Bld: 131 mg/dL — ABNORMAL HIGH (ref 70–99)
Potassium: 3.6 mmol/L (ref 3.5–5.1)
Sodium: 131 mmol/L — ABNORMAL LOW (ref 135–145)

## 2022-09-30 LAB — BASIC METABOLIC PANEL
Anion gap: 6 (ref 5–15)
BUN: 16 mg/dL (ref 8–23)
CO2: 24 mmol/L (ref 22–32)
Calcium: 8.1 mg/dL — ABNORMAL LOW (ref 8.9–10.3)
Chloride: 103 mmol/L (ref 98–111)
Creatinine, Ser: 1.06 mg/dL (ref 0.61–1.24)
GFR, Estimated: >60 mL/min (ref >60)
Glucose, Bld: 86 mg/dL (ref 70–99)
Potassium: 2.9 mmol/L — ABNORMAL LOW (ref 3.5–5.1)
Sodium: 133 mmol/L — ABNORMAL LOW (ref 135–145)

## 2022-09-30 LAB — ECHOCARDIOGRAM LIMITED: Height: 73 in

## 2022-09-30 LAB — CULTURE, BLOOD (ROUTINE X 2)

## 2022-09-30 LAB — MAGNESIUM: Magnesium: 2.3 mg/dL (ref 1.7–2.4)

## 2022-09-30 MED ORDER — POTASSIUM CHLORIDE CRYS ER 20 MEQ PO TBCR
40.0000 meq | EXTENDED_RELEASE_TABLET | Freq: Once | ORAL | Status: AC
  Administered 2022-09-30: 40 meq via ORAL
  Filled 2022-09-30: qty 2

## 2022-09-30 MED ORDER — MAGIC MOUTHWASH
10.0000 mL | Freq: Four times a day (QID) | ORAL | Status: DC
  Administered 2022-09-30 – 2022-10-13 (×45): 10 mL via ORAL
  Filled 2022-09-30 (×56): qty 10

## 2022-09-30 MED ORDER — POTASSIUM CHLORIDE CRYS ER 20 MEQ PO TBCR
40.0000 meq | EXTENDED_RELEASE_TABLET | ORAL | Status: AC
  Administered 2022-09-30 (×2): 40 meq via ORAL
  Filled 2022-09-30 (×2): qty 2

## 2022-09-30 NOTE — Hospital Course (Signed)
PMH of HTN, CVA, anxiety RCC, neurogenic bladder with chronic indwelling Foley catheter presented to hospital with complaints of unresponsive episode at home.  Reportedly patient was giving blank stare.  Currently being treated for UTI.

## 2022-09-30 NOTE — Evaluation (Signed)
Physical Therapy Evaluation Patient Details Name: Adam Rios. MRN: [id number] DOB: 19-Dec-1940 Today's Date: 09/30/2022  History of Present Illness  Pt is an 82 y/o with a medical history significant of recurrent UTIs, cognitive impairment, history of CVA, essential hypertension, renal cell carcinoma, neurogenic bladder, chronic indwelling Foley catheter, some dementia, insomnia, chronic diastolic heart failure and history of right lower extremity cellulitis. He has had a PPM placed and 09/08/22 had a cardiomems implant via RHC to the pulmonary artery for assist in management of HFpEF. MD assessment includes: Severe sepsis and acute UTI secondary to chronic indwelling Foley catheter secondary to Proteus mirabilis infection, acute metabolic encephalopathy, AKI, and normocytic anemia.   Clinical Impression  Pt was pleasant and motivated to participate during the session and put forth good effort throughout. Pt required significant effort, use of bed rail, and min A during sup to sit with cues for general sequencing.  Pt was able to stand from multiple surfaces during transfer training without physical assistance but needed significant cuing for increased trunk flexion and hand placement.  Pt was able to amb 1 x 10 feet and 1 x 15 feet with low cadence but was generally steady with cues for upright posture and amb closer to the RW.  Pt will benefit from continued PT services upon discharge to safely address deficits listed in patient problem list for decreased caregiver assistance and eventual return to PLOF.         Recommendations for follow up therapy are one component of a multi-disciplinary discharge planning process, led by the attending physician.  Recommendations may be updated based on patient status, additional functional criteria and insurance authorization.  Follow Up Recommendations       Assistance Recommended at Discharge Intermittent Supervision/Assistance  Patient can return home  with the following  A little help with walking and/or transfers;A little help with bathing/dressing/bathroom;Assistance with cooking/housework;Assist for transportation;Help with stairs or ramp for entrance    Equipment Recommendations None recommended by PT  Recommendations for Other Services       Functional Status Assessment Patient has had a recent decline in their functional status and demonstrates the ability to make significant improvements in function in a reasonable and predictable amount of time.     Precautions / Restrictions Precautions Precautions: Fall Restrictions Weight Bearing Restrictions: No      Mobility  Bed Mobility Overal bed mobility: Needs Assistance Bed Mobility: Supine to Sit     Supine to sit: Min assist     General bed mobility comments: Min A and use of bed rail to come to full upright sitting; pt normally sleeps in recliner    Transfers Overall transfer level: Needs assistance Equipment used: Rolling walker (2 wheels) Transfers: Sit to/from Stand Sit to Stand: Min guard           General transfer comment: Mod verbal cues for hand placement and increased trunk flexion    Ambulation/Gait Ambulation/Gait assistance: Min guard Gait Distance (Feet): 15 Feet x 1, 10 Feet x 1 Assistive device: Rolling walker (2 wheels) Gait Pattern/deviations: Step-through pattern, Decreased step length - right, Decreased step length - left, Trunk flexed Gait velocity: decreased     General Gait Details: Slow cadence with short B step length and mod trunk flexion but generally steady without LOB or buckling  Stairs            Wheelchair Mobility    Modified Rankin (Stroke Patients Only)       Balance Overall  balance assessment: Needs assistance Sitting-balance support: Feet supported, Single extremity supported Sitting balance-Leahy Scale: Good     Standing balance support: Bilateral upper extremity supported, During functional activity,  Reliant on assistive device for balance Standing balance-Leahy Scale: Fair                               Pertinent Vitals/Pain Pain Assessment Pain Assessment: No/denies pain    Home Living Family/patient expects to be discharged to:: Private residence Living Arrangements: Children Available Help at Discharge: Family;Available PRN/intermittently Type of Home: House Home Access: Stairs to enter Entrance Stairs-Rails: None Entrance Stairs-Number of Steps: 1 partial step   Home Layout: Able to live on main level with bedroom/bathroom Home Equipment: Educational psychologist (4 wheels);Wheelchair - manual;Grab bars - toilet;Grab bars - tub/shower;Adaptive equipment Additional Comments: Pt lives with daughter, son in law and grandchildren, receives meals on wheels & has a personal care aide 1x/week to assist with bathing    Prior Function               Mobility Comments: Mod Ind ambulation household distances with a rollator, sleeps in recliner ADLs Comments: Has a personal care aide 1x/week that assists with bathing, requires some assistance for dressing. Pt uses briefs and receives meals on wheels.     Hand Dominance   Dominant Hand: Right    Extremity/Trunk Assessment   Upper Extremity Assessment Upper Extremity Assessment: Generalized weakness    Lower Extremity Assessment Lower Extremity Assessment: Generalized weakness       Communication   Communication: No difficulties  Cognition Arousal/Alertness: Awake/alert Behavior During Therapy: WFL for tasks assessed/performed Overall Cognitive Status: No family/caregiver present to determine baseline cognitive functioning                                          General Comments      Exercises Other Exercises Other Exercises: sit to/from stand transfer training from multiple height surfaces   Assessment/Plan    PT Assessment Patient needs continued PT services  PT Problem List  Decreased strength;Decreased activity tolerance;Decreased balance;Decreased mobility;Decreased knowledge of use of DME       PT Treatment Interventions DME instruction;Gait training;Stair training;Functional mobility training;Therapeutic activities;Therapeutic exercise;Balance training;Patient/family education    PT Goals (Current goals can be found in the Care Plan section)  Acute Rehab PT Goals Patient Stated Goal: To get back home PT Goal Formulation: With patient Time For Goal Achievement: 10/13/22 Potential to Achieve Goals: Good    Frequency Min 3X/week     Co-evaluation               AM-PAC PT "6 Clicks" Mobility  Outcome Measure Help needed turning from your back to your side while in a flat bed without using bedrails?: A Little Help needed moving from lying on your back to sitting on the side of a flat bed without using bedrails?: A Little Help needed moving to and from a bed to a chair (including a wheelchair)?: A Little Help needed standing up from a chair using your arms (e.g., wheelchair or bedside chair)?: A Little Help needed to walk in hospital room?: A Little Help needed climbing 3-5 steps with a railing? : A Lot 6 Click Score: 17    End of Session Equipment Utilized During Treatment: Gait belt Activity Tolerance: Patient tolerated treatment  well Patient left: in chair;with call bell/phone within reach;with chair alarm set;with nursing/sitter in room Nurse Communication: Mobility status PT Visit Diagnosis: Difficulty in walking, not elsewhere classified (R26.2);Muscle weakness (generalized) (M62.81)    Time: 1610-9604 PT Time Calculation (min) (ACUTE ONLY): 29 min   Charges:   PT Evaluation $PT Eval Moderate Complexity: 1 Mod PT Treatments $Therapeutic Activity: 8-22 mins      D. Elly Modena PT, DPT 09/30/22, 5:32 PM

## 2022-09-30 NOTE — Progress Notes (Signed)
[facility] Progress Note Patient: Adam Rios. ZOX:[id number] DOB: 04-Jan-1941 DOA: 09/28/2022  DOS: the patient was seen and examined on 09/30/2022  Brief hospital course: PMH of HTN, CVA, anxiety RCC, neurogenic bladder with chronic indwelling Foley catheter presented to hospital with complaints of unresponsive episode at home.  Reportedly patient was giving blank stare.  Currently being treated for UTI. Assessment and Plan: Severe sepsis acute UTI secondary to chronic indwelling Foley catheter secondary to Proteus mirabilis infection. With tachycardia, tachypnea as well as acute kidney injury meeting criteria for severe sepsis at the time of admission with source of infection is UTI. On IV antibiotics. Foley catheter changed on 4/30. Blood cultures so far negative.  Urine culture growing Proteus.  Sensitivity currently pending. Sepsis physiology resolved. Mentation improving. Monitor.  Acute metabolic encephalopathy. Unresponsive episode. Original reason for patient's presentation. Mentation improving.  No focal deficit.  Able to follow all commands.  Likely combination of AKI as well as sepsis currently dehydration.  AKI. Hypokalemia Potassium 2.5 at the time of admission.  Serum creatinine 1.47.  Baseline 1.12. Currently with IV fluids, potassium and serum creatinine both improved. Stop fluids and monitor. Potassium replaced.  Normocytic anemia. Hemoglobin 10.9. On repletion 13.3. Likely this is dilutional drop no bleeding. Monitor.  Dementia. On Aricept.  No behavioral issues.  Chronic neuropathy. Gabapentin nightly.  Milligram. Will continue for now.  History of chronic diastolic CHF. On torsemide at home.  EF 50-55% in 2023. Currently euvolemic. Monitor.  HLD. Continuing statin.  History of CVA. On aspirin and Plavix. Which I will continue.  Concern for A-fib with RVR-ruled out. NSVT' Reported to have A-fib at the time of admission. On my  evaluation on the EKG there are P waves seen in the lead II. Currently no indication for A-fib for now. Most likely sinus tachycardia in the setting of sepsis at the time of admission. Resume Toprol-XL. 25 mg daily.   Subjective: No nausea no vomiting no fever no chills.  Denies any acute complaint.  NSVT seen on the telemetry.  Asymptomatic.  Physical Exam: General: in Mild distress, No Rash Cardiovascular: S1 and S2 Present, No Murmur Respiratory: Good respiratory effort, Bilateral Air entry present. No Crackles, No wheezes Abdomen: Bowel Sound present, No tenderness Extremities: No edema Neuro: Alert and oriented x3, no new focal deficit  Data Reviewed: I have Reviewed nursing notes, Vitals, and Lab results. Since last encounter, pertinent lab results CBC and BMP   . I have ordered test including CBC and BMP  .   Disposition: Status is: Inpatient Remains inpatient appropriate because: Currently awaiting culture and sensitivity.  PT OT also consulted.  enoxaparin (LOVENOX) injection 40 mg Start: 09/29/22 1000   Family Communication: No one at bedside Level of care: Telemetry Medical   Vitals:   09/30/22 0110 09/30/22 0507 09/30/22 0734 09/30/22 1141  BP: 114/62 123/80 117/75 95/66  Pulse: 69 85 71 73  Resp: 18 18 20 18   Temp: 98.2 F (36.8 C) 98.4 F (36.9 C) 98.7 F (37.1 C) 98.4 F (36.9 C)  TempSrc: Oral     SpO2: 96% 97% 100% 96%  Weight:      Height:         Author: Lynden Oxford, MD 09/30/2022 3:53 PM  Please look on [email or web address] to find out who is on call.

## 2022-10-01 DIAGNOSIS — N390 Urinary tract infection, site not specified: Secondary | ICD-10-CM | POA: Diagnosis not present

## 2022-10-01 DIAGNOSIS — A419 Sepsis, unspecified organism: Secondary | ICD-10-CM | POA: Diagnosis not present

## 2022-10-01 LAB — BASIC METABOLIC PANEL
Anion gap: 7 (ref 5–15)
BUN: 15 mg/dL (ref 8–23)
CO2: 23 mmol/L (ref 22–32)
Calcium: 8.2 mg/dL — ABNORMAL LOW (ref 8.9–10.3)
Chloride: 106 mmol/L (ref 98–111)
Creatinine, Ser: 1.02 mg/dL (ref 0.61–1.24)
GFR, Estimated: >60 mL/min (ref >60)
Glucose, Bld: 85 mg/dL (ref 70–99)
Potassium: 3.9 mmol/L (ref 3.5–5.1)
Sodium: 136 mmol/L (ref 135–145)

## 2022-10-01 LAB — CBC
HCT: 35.6 % — ABNORMAL LOW (ref 39.0–52.0)
Hemoglobin: 11 g/dL — ABNORMAL LOW (ref 13.0–17.0)
MCH: 26.5 pg (ref 26.0–34.0)
MCHC: 30.9 g/dL (ref 30.0–36.0)
MCV: 85.8 fL (ref 80.0–100.0)
Platelets: 149 10*3/uL — ABNORMAL LOW (ref 150–400)
RBC: 4.15 MIL/uL — ABNORMAL LOW (ref 4.22–5.81)
RDW: 16 % — ABNORMAL HIGH (ref 11.5–15.5)
WBC: 6.8 10*3/uL (ref 4.0–10.5)
nRBC: 0 % (ref 0.0–0.2)

## 2022-10-01 LAB — ECHOCARDIOGRAM LIMITED
Area-P 1/2: 3.81 cm2
P 1/2 time: 386 msec
S' Lateral: 3 cm
Weight: 3568 oz

## 2022-10-01 LAB — GLUCOSE, CAPILLARY: Glucose-Capillary: 116 mg/dL — ABNORMAL HIGH (ref 70–99)

## 2022-10-01 LAB — URINE CULTURE: Culture: >=100000 — AB

## 2022-10-01 LAB — MAGNESIUM: Magnesium: 2.3 mg/dL (ref 1.7–2.4)

## 2022-10-01 LAB — CULTURE, BLOOD (ROUTINE X 2): Culture: NO GROWTH

## 2022-10-01 MED ORDER — CEPHALEXIN 500 MG PO CAPS: 500.0000 mg | ORAL_CAPSULE | Freq: Three times a day (TID) | ORAL | Status: DC

## 2022-10-01 MED ORDER — SENNOSIDES-DOCUSATE SODIUM 8.6-50 MG PO TABS
1.0000 | ORAL_TABLET | Freq: Two times a day (BID) | ORAL | Status: DC | PRN
  Administered 2022-10-06: 1 via ORAL
  Filled 2022-10-01: qty 1

## 2022-10-01 MED ORDER — CEPHALEXIN 500 MG PO CAPS
500.0000 mg | ORAL_CAPSULE | Freq: Four times a day (QID) | ORAL | Status: AC
  Administered 2022-10-01 – 2022-10-07 (×25): 500 mg via ORAL
  Filled 2022-10-01 (×25): qty 1

## 2022-10-01 MED ORDER — POTASSIUM CHLORIDE CRYS ER 20 MEQ PO TBCR
20.0000 meq | EXTENDED_RELEASE_TABLET | Freq: Once | ORAL | Status: AC
  Administered 2022-10-01: 20 meq via ORAL
  Filled 2022-10-01: qty 1

## 2022-10-01 MED ORDER — TORSEMIDE 20 MG PO TABS
20.0000 mg | ORAL_TABLET | Freq: Every day | ORAL | Status: DC
  Administered 2022-10-01: 20 mg via ORAL
  Filled 2022-10-01: qty 1

## 2022-10-01 NOTE — Progress Notes (Signed)
PT Cancellation Note  Patient Details Name: Adam Rios. MRN: [id number] DOB: [birth year]   Cancelled Treatment:     PT attempt. 2nd attempt today. First attempt, pt having drainage from penis around catheter. RN made aware. 2nd attempt. Pt refused." I just can't do it now that I'm so worn out. I sat up in the chair for too long. Can you come back in the morning?" Author will return in the morning as requested. Acute PT will continue current POC progressing pt as able.     Rushie Chestnut 10/01/2022, 3:14 PM

## 2022-10-01 NOTE — Evaluation (Signed)
Occupational Therapy Evaluation Patient Details Name: Adam Rios. MRN: [id number] DOB: 1940/07/18 Today's Date: 10/01/2022   History of Present Illness Pt is an 82 y/o with a medical history significant of recurrent UTIs, cognitive impairment, history of CVA, essential hypertension, renal cell carcinoma, neurogenic bladder, chronic indwelling Foley catheter, some dementia, insomnia, chronic diastolic heart failure and history of right lower extremity cellulitis. He has had a PPM placed and 09/08/22 had a cardiomems implant via RHC to the pulmonary artery for assist in management of HFpEF. MD assessment includes: Severe sepsis and acute UTI secondary to chronic indwelling Foley catheter secondary to Proteus mirabilis infection, acute metabolic encephalopathy, AKI, and normocytic anemia.   Clinical Impression   Patient received for OT evaluation. See flowsheet below for details of function. Generally, patient requiring MIN A for sit to stand t/f, CGA with RW and gait belt for functional mobility, and set up-MIN A for ADLs. Pt walked approx 10 feet to sink, stood x4 minutes for grooming, and walked back to recliner chair. Slow pace, forward flexed posture at RW, short steps, no overt loss of balance. Patient will benefit from continued OT while in acute care.      Recommendations for follow up therapy are one component of a multi-disciplinary discharge planning process, led by the attending physician.  Recommendations may be updated based on patient status, additional functional criteria and insurance authorization.   Assistance Recommended at Discharge Frequent or constant Supervision/Assistance  Patient can return home with the following A little help with bathing/dressing/bathroom;A little help with walking and/or transfers;Assistance with cooking/housework;Direct supervision/assist for medications management;Direct supervision/assist for financial management;Assist for transportation;Help with  stairs or ramp for entrance    Functional Status Assessment  Patient has had a recent decline in their functional status and demonstrates the ability to make significant improvements in function in a reasonable and predictable amount of time.  Equipment Recommendations  None recommended by OT    Recommendations for Other Services       Precautions / Restrictions Precautions Precautions: Fall Restrictions Weight Bearing Restrictions: No      Mobility Bed Mobility               General bed mobility comments: NT; pt received in recliner    Transfers Overall transfer level: Needs assistance Equipment used: Rolling walker (2 wheels) Transfers: Sit to/from Stand Sit to Stand: Min assist           General transfer comment: Cues for hand placement and foot placement prior to transfer.      Balance Overall balance assessment: Needs assistance Sitting-balance support: Feet supported Sitting balance-Leahy Scale: Good     Standing balance support: Bilateral upper extremity supported, During functional activity, Reliant on assistive device for balance Standing balance-Leahy Scale: Fair Standing balance comment: no overt loss of balance noted                           ADL either performed or assessed with clinical judgement   ADL Overall ADL's : Needs assistance/impaired     Grooming: Wash/dry face;Oral care;Min guard;Standing;Brushing hair;Moderate assistance Grooming Details (indicate cue type and reason): OT CGA during standing for safety (gait belt used)                             Functional mobility during ADLs: Min guard;Rolling walker (2 wheels) General ADL Comments: Pt with forward flexed trunk posture  at [facility] during mobility. Difficulty with sit to stand from recliner chair. Anticipate pt to be near functional baseline with ADLs (requiring AE or assist for LB ADLs from seated).     Vision         Perception     Praxis       Pertinent Vitals/Pain Pain Assessment Pain Assessment: No/denies pain     Hand Dominance Right   Extremity/Trunk Assessment Upper Extremity Assessment Upper Extremity Assessment: Generalized weakness (UE ROM to approx 130 degrees; needing assist to comb back of hair; functional strength; 3/5 shoulder strength. BIL hand grip functional.)   Lower Extremity Assessment Lower Extremity Assessment: Generalized weakness       Communication Communication Communication: No difficulties   Cognition Arousal/Alertness: Awake/alert Behavior During Therapy: WFL for tasks assessed/performed Overall Cognitive Status: No family/caregiver present to determine baseline cognitive functioning                                 General Comments: Pt is oriented to person and fact that he's in the hospital. Unable to state the date, but when told that we just started a new month, states, "oh, May!" Follows all commands appropriately. Is pleasanta nd agreeable.     General Comments  Pt on room air throughout session. Pt with skin tear on R forearm that began bleeding; RN notified and bandage provided.    Exercises     Shoulder Instructions      Home Living Family/patient expects to be discharged to:: Private residence Living Arrangements: Children Available Help at Discharge: Family;Available PRN/intermittently Type of Home: House Home Access: Stairs to enter [employer] of Steps: 1 partial step Entrance Stairs-Rails: None Home Layout: Able to live on main level with bedroom/bathroom     Bathroom Shower/Tub: Walk-in shower         Home Equipment: Educational psychologist (4 wheels);Wheelchair - manual;Grab bars - toilet;Grab bars - tub/shower;Adaptive equipment Adaptive Equipment: Sock aid;Reacher;Long-handled shoe horn (per pt report) Additional Comments: Pt lives with daughter, son in law and grandchildren, receives meals on wheels & has a personal care aide 1x/week  to assist with bathing      Prior Functioning/Environment Prior Level of Function : Needs assist             Mobility Comments: Mod Ind ambulation household distances with a rollator, sleeps in recliner. Pt denies recent falls. ADLs Comments: Has a personal care aide 1x/week that assists with bathing, requires some assistance for dressing. Pt uses briefs and receives meals on wheels. States he uses sock aid for LB dressing. Pt unable to state what he enjoys doing for fun.        OT Problem List: Decreased activity tolerance;Impaired balance (sitting and/or standing);Decreased safety awareness      OT Treatment/Interventions: Self-care/ADL training;Therapeutic exercise;Therapeutic activities    OT Goals(Current goals can be found in the care plan section) Acute Rehab OT Goals Patient Stated Goal: Get better; go home OT Goal Formulation: With patient Time For Goal Achievement: 10/15/22 Potential to Achieve Goals: Good ADL Goals Pt Will Perform Grooming: with modified independence;standing Pt Will Transfer to Toilet: with modified independence;ambulating;grab bars Pt Will Perform Toileting - Clothing Manipulation and hygiene: with modified independence;sit to/from stand  OT Frequency: Min 2X/week    Co-evaluation              AM-PAC OT "6 Clicks" Daily Activity     Outcome Measure  Help from another person eating meals?: None Help from another person taking care of personal grooming?: A Little Help from another person toileting, which includes using toliet, bedpan, or urinal?: A Little Help from another person bathing (including washing, rinsing, drying)?: A Lot Help from another person to put on and taking off regular upper body clothing?: A Little Help from another person to put on and taking off regular lower body clothing?: A Lot 6 Click Score: 17   End of Session Equipment Utilized During Treatment: Gait belt;Rolling walker (2 wheels) Nurse Communication: Mobility  status;Other (comment) (pt's R forearm skin tear)  Activity Tolerance: Patient tolerated treatment well;Patient limited by fatigue Patient left: in chair;with call bell/phone within reach;with chair alarm set  OT Visit Diagnosis: Unsteadiness on feet (R26.81)                Time: 1610-9604 OT Time Calculation (min): 26 min Charges:  OT General Charges $OT Visit: 1 Visit OT Evaluation $OT Eval Moderate Complexity: 1 Mod OT Treatments $Self Care/Home Management : 8-22 mins  Linward Foster, MS, OTR/L  Alvester Morin 10/01/2022, 9:44 AM

## 2022-10-01 NOTE — Progress Notes (Signed)
[facility] Progress Note Patient: Adam Rios. WUJ:[id number] DOB: Aug 04, 1940 DOA: 09/28/2022  DOS: the patient was seen and examined on 10/01/2022  Brief hospital course: PMH of HTN, CVA, anxiety RCC, neurogenic bladder with chronic indwelling Foley catheter presented to hospital with complaints of unresponsive episode at home.  Reportedly patient was giving blank stare.  Currently being treated for UTI. Assessment and Plan: Severe sepsis acute UTI secondary to chronic indwelling Foley catheter secondary to Proteus mirabilis infection. With tachycardia, tachypnea as well as acute kidney injury meeting criteria for severe sepsis at the time of admission with source of infection is UTI. On IV antibiotics. Foley catheter changed on 4/30. Blood cultures so far negative.  Urine culture growing Proteus.  Sensitivity currently pending. Sepsis physiology resolved. Mentation improving. Monitor.  Acute metabolic encephalopathy. Unresponsive episode. Original reason for patient's presentation. Mentation improving.  No focal deficit.  Able to follow all commands.  Likely combination of AKI as well as sepsis currently dehydration.  AKI. Hypokalemia Potassium 2.5 at the time of admission.  Serum creatinine 1.47.  Baseline 1.12. Currently with IV fluids, potassium and serum creatinine both improved. Stop fluids and monitor. Potassium replaced.  Concern for A-fib with RVR-ruled out. Frequent PVC unknown NSVT Reported to have A-fib at the time of admission. On my evaluation on the EKG there are P waves seen in the lead II. Currently no evidence of A-fib for now. Most likely sinus tachycardia in the setting of sepsis at the time of admission. Continue Toprol-XL. Echocardiogram performed shows preserved EF. Patient has established discharge with [facility] cardiology and recommend to follow-up with them outpatient.  Chronic diastolic CHF. On torsemide at home.  EF 55 to 60%. Currently  euvolemic.  Has frequent visits to [facility] urgent care clinic for IV diuresis. Will initiate torsemide in the hospital.  Monitor as the patient's blood pressure is still soft before discharge home.  Normocytic anemia. Hemoglobin 10.9. On admission 13.3. Likely this is dilutional drop, no bleeding. Monitor.  Dementia. On Aricept.  No behavioral issues.  Chronic neuropathy. Gabapentin nightly.  Milligram. Will continue for now.  HLD. Continuing statin.  History of CVA. On aspirin and Plavix. Which I will continue.  Subjective: No acute complaint.  No nausea no vomiting.  Reported some sore throat which currently resolved. No thrush seen.  Physical Exam: General: in Mild distress, No Rash Cardiovascular: S1 and S2 Present, No Murmur Respiratory: Good respiratory effort, Bilateral Air entry present.  Basal crackles, No wheezes Abdomen: Bowel Sound present, No tenderness Extremities: Bilateral edema Neuro: Alert and oriented x3, no new focal deficit   Data Reviewed: I have Reviewed nursing notes, Vitals, and Lab results. Since last encounter, pertinent lab results CBC and BMP   . I have ordered test including BMP and magnesium  .    Disposition: Status is: Inpatient Remains inpatient appropriate because: Monitor while initiation of diuresis for blood pressure stability.  Likely home on 5/3.  enoxaparin (LOVENOX) injection 40 mg Start: 09/29/22 1000   Family Communication: Discussed with daughter on the phone. Level of care: Telemetry Medical   Vitals:   10/01/22 0500 10/01/22 0503 10/01/22 0815 10/01/22 1100  BP:  113/70 126/78 115/72  Pulse:  64 60   Resp:  20 17 18   Temp:  98.3 F (36.8 C)  97.6 F (36.4 C)  TempSrc:    Oral  SpO2:  97% 100% 100%  Weight: 99 kg     Height:  Author: Lynden Oxford, MD 10/01/2022 3:59 PM  Please look on [email or web address] to find out who is on call.

## 2022-10-02 DIAGNOSIS — A419 Sepsis, unspecified organism: Secondary | ICD-10-CM | POA: Diagnosis not present

## 2022-10-02 DIAGNOSIS — N390 Urinary tract infection, site not specified: Secondary | ICD-10-CM | POA: Diagnosis not present

## 2022-10-02 LAB — BASIC METABOLIC PANEL
Anion gap: 8 (ref 5–15)
BUN: 18 mg/dL (ref 8–23)
CO2: 26 mmol/L (ref 22–32)
Calcium: 8.2 mg/dL — ABNORMAL LOW (ref 8.9–10.3)
Chloride: 104 mmol/L (ref 98–111)
Creatinine, Ser: 1.37 mg/dL — ABNORMAL HIGH (ref 0.61–1.24)
GFR, Estimated: 52 mL/min — ABNORMAL LOW (ref >60)
Glucose, Bld: 98 mg/dL (ref 70–99)
Potassium: 4 mmol/L (ref 3.5–5.1)
Sodium: 138 mmol/L (ref 135–145)

## 2022-10-02 LAB — CBC
HCT: 35.7 % — ABNORMAL LOW (ref 39.0–52.0)
Hemoglobin: 11.3 g/dL — ABNORMAL LOW (ref 13.0–17.0)
MCH: 26.8 pg (ref 26.0–34.0)
MCHC: 31.7 g/dL (ref 30.0–36.0)
MCV: 84.8 fL (ref 80.0–100.0)
Platelets: 156 10*3/uL (ref 150–400)
RBC: 4.21 MIL/uL — ABNORMAL LOW (ref 4.22–5.81)
RDW: 16 % — ABNORMAL HIGH (ref 11.5–15.5)
WBC: 6.1 10*3/uL (ref 4.0–10.5)
nRBC: 0 % (ref 0.0–0.2)

## 2022-10-02 LAB — MAGNESIUM: Magnesium: 2.1 mg/dL (ref 1.7–2.4)

## 2022-10-02 NOTE — TOC Progression Note (Signed)
Transition of Care (TOC) - Progression Note    Patient Details  Name: Adam Rios. MRN: [id number] Date of Birth: September 13, 1940  Transition of Care [facility]) CM/SW Contact  Allena Katz, LCSW Phone Number: 10/02/2022, 2:17 PM  Clinical Narrative:    CSW spoke with daughter who reports pt is alone from 7-4 daily. Daughter states [employer]PS was called on her and that she doesn't have the funds to pay for private duty services but had been working with [facility]. Daughter reports that she is not taking patient home until a plan is established as she cannot afford to pay a caregiver every month. CSW spoke with [facility] representative, with daughters permission,  who stated that during a previous admission it was noted that pt was unable to make his own decisions and for that reason they are unable to see patient at home due to him being unable to make his own decisions and being unsafe alone. Pt assigned Joy sumpter from [employer] [phone]. CSW LVM with Joy and will wait to hear back. Unsure if medicaid application has been started for patient. Daughter reports pt makes around 1500 a month in disability.         Expected Discharge Plan and Services                                               Social Determinants of Health (SDOH) Interventions SDOH Screenings   Food Insecurity: No Food Insecurity (09/29/2022)  Housing: Low Risk  (09/29/2022)  Transportation Needs: No Transportation Needs (09/29/2022)  Utilities: Not At Risk (09/29/2022)  Tobacco Use: Medium Risk (09/29/2022)    Readmission Risk Interventions    08/11/2022    4:53 PM 06/11/2022    2:19 PM 02/12/2022    2:17 PM  Readmission Risk Prevention Plan  Transportation Screening Complete Complete Complete  PCP or Specialist Appt within 3-5 Days  Complete Complete  HRI or Home Care Consult  Complete Complete  Social Work Consult for Recovery Care Planning/Counseling  Complete Complete  Palliative Care Screening   Not Applicable Not Applicable  Medication Review [facility]) Complete Complete Referral to Pharmacy  [facility] or Home Care Consult Complete    Skilled Nursing Facility Complete

## 2022-10-02 NOTE — Progress Notes (Signed)
[facility] Progress Note Patient: Adam Rios. ZOX:[id number] DOB: [birth year] DOA: 09/28/2022  DOS: the patient was seen and examined on 10/02/2022  Brief hospital course: PMH of HTN, CVA, anxiety RCC, neurogenic bladder with chronic indwelling Foley catheter presented to hospital with complaints of unresponsive episode at home.  Reportedly patient was giving blank stare.  Currently being treated for UTI. Assessment and Plan: Severe sepsis acute UTI secondary to chronic indwelling Foley catheter secondary to Proteus mirabilis infection. With tachycardia, tachypnea as well as acute kidney injury meeting criteria for severe sepsis at the time of admission with source of infection is UTI. On IV antibiotics. Foley catheter changed on 4/30. Blood cultures so far negative.  Urine culture growing Proteus.  On oral Keflex based on sensitivity.  Will request longer-term therapy. Sepsis physiology resolved. Mentation improving. Monitor.  Acute metabolic encephalopathy. Unresponsive episode. Original reason for patient's presentation. Mentation improving.  No focal deficit.  Able to follow all commands.  Likely combination of AKI as well as sepsis currently dehydration. Patient does appear to have capacity to make medical decisions as of evaluation on 5/3.  Patient does not appear to be making right decisions as he desires to go home with his severe deconditioning.  AKI. Hypokalemia Potassium 2.5 at the time of admission.  Serum creatinine 1.47.  Baseline 1.12. Currently with IV fluids, potassium and serum creatinine both improved. Potassium replaced.  Concern for A-fib with RVR-ruled out. Frequent PVC unknown NSVT Reported to have A-fib at the time of admission. On my evaluation on the EKG there are P waves seen in the lead II. Currently no evidence of A-fib for now. Most likely sinus tachycardia in the setting of sepsis at the time of admission. Continue Toprol-XL. Echocardiogram  performed shows preserved EF. Patient has established discharge with [facility] cardiology and recommend to follow-up with them outpatient.  Chronic diastolic CHF. On torsemide at home.  EF 55 to 60%. Currently euvolemic.  Has frequent visits to [facility] urgent care clinic for IV diuresis. Torsemide was resumed but renal function worsened therefore on hold again.  Normocytic anemia. Hemoglobin 10.9. On admission 13.3. Likely this is dilutional drop, no bleeding. Monitor.  Dementia. On Aricept.  No behavioral issues.  Chronic neuropathy. Gabapentin nightly.   Will continue for now.  HLD. Continuing statin.  History of CVA. On aspirin and Plavix. Which I will continue.  History of HoLEP Chronic indwelling Foley catheter. Patient appears to have history of chronic intermittent catheterization for 10+ years. After his HoLEP in January 2024 he was unable to perform intermittent self-catheterization and had constant urinary leakage. In February 2024 patient's primary urologist approved for placement of a chronic indwelling Foley catheter for incontinence. Patient appears to have recurrent UTIs since then including this admission. Discussed with urology, recommend to continue current management but recommend a voiding trial with urology outpatient since he has a Foley catheter for so long. Foley catheter already upped in size from 56 [place] to 16 [place] and continues to have leakage. Unable to perform urodynamic studies in the hospital. Outpatient follow-up with primary urologist recommended with Dr. Al Corpus. Continue Myrbetriq.  Social issues. It appears that the patient case is currently being evaluated by APS.  Unsure reason why.  Social worker currently on the case.  Will awaiting further clarity on the situation.  Patient wants to go back home.  Subjective: No acute complaint.  No nausea no vomiting no fever no chills.  Physical Exam: Clear to auscultation. S1-S2 present. Bowel sound  present. No edema. Anxious  Data Reviewed: I have Reviewed nursing notes, Vitals, and Lab results. Reviewed BMP.  Reordered BMP and magnesium.  Disposition: Status is: Inpatient Remains inpatient appropriate because: Awaiting improvement in renal function.  Awaiting clarity on home situation given APS involvement.  enoxaparin (LOVENOX) injection 40 mg Start: 09/29/22 1000   Family Communication: Discussed with daughter on the phone on 5/2. Level of care: Telemetry Medical   Vitals:   10/02/22 0205 10/02/22 0526 10/02/22 0724 10/02/22 1549  BP:  121/80 124/77 94/71  Pulse:  62 64 66  Resp:  16 16 18   Temp:  97.8 F (36.6 C) 97.9 F (36.6 C) 98 F (36.7 C)  TempSrc:      SpO2:  98% 99% 97%  Weight: 95.5 kg     Height:         Author: Lynden Oxford, MD 10/02/2022 5:57 PM  Please look on [email or web address] to find out who is on call.

## 2022-10-02 NOTE — Progress Notes (Signed)
Physical Therapy Treatment Patient Details Name: Adam Rios. MRN: [id number] DOB: 1940/06/13 Today's Date: 10/02/2022   History of Present Illness Pt is an 82 y/o with a medical history significant of recurrent UTIs, cognitive impairment, history of CVA, essential hypertension, renal cell carcinoma, neurogenic bladder, chronic indwelling Foley catheter, some dementia, insomnia, chronic diastolic heart failure and history of right lower extremity cellulitis. He has had a PPM placed and 09/08/22 had a cardiomems implant via RHC to the pulmonary artery for assist in management of HFpEF. MD assessment includes: Severe sepsis and acute UTI secondary to chronic indwelling Foley catheter secondary to Proteus mirabilis infection, acute metabolic encephalopathy, AKI, and normocytic anemia.    PT Comments    Pt was asleep in long sitting upon arrival. He is A and O x 3 but lacks insight of deficits and presents with poor overall safety awareness. Pt lives with daughter and family. Discussed level of assistance available at home at DC. Per pt, family works or in school during the day. Author attempted to reach pt's daughter post session to discuss concerns however had to leave voicemail. Pt does require assistance to exit bed, stand, and ambulate with RW. Upon standing, continued urine leak around foley cath. MD aware. Pt uses rollator at baseline but is unwilling to use RW. Discussed STR at DC but pt refuses any discussion." I'm going home." Pt will need extensive PT going forward to maximize safety and independence with all ADLs.    Recommendations for follow up therapy are one component of a multi-disciplinary discharge planning process, led by the attending physician.  Recommendations may be updated based on patient status, additional functional criteria and insurance authorization.     Assistance Recommended at Discharge Frequent or constant Supervision/Assistance  Patient can return home with the  following A little help with walking and/or transfers;A lot of help with bathing/dressing/bathroom;Assistance with cooking/housework;Direct supervision/assist for medications management;Direct supervision/assist for financial management;Assist for transportation;Help with stairs or ramp for entrance   Equipment Recommendations  Other (comment) (pt has personal rollator at home however author recommends use of RW. pt currently refusing RW use at DC)       Precautions / Restrictions Precautions Precautions: Fall Restrictions Weight Bearing Restrictions: No     Mobility  Bed Mobility Overal bed mobility: Needs Assistance Bed Mobility: Supine to Sit  Supine to sit: Min assist, Mod assist   Transfers Overall transfer level: Needs assistance Equipment used: Rolling walker (2 wheels) Transfers: Sit to/from Stand Sit to Stand: Min assist  General transfer comment: Min assist from elevated bed height. mod assist form lower recliner surface    Ambulation/Gait Ambulation/Gait assistance: Min guard Gait Distance (Feet): 50 Feet Assistive device: Rolling walker (2 wheels) Gait Pattern/deviations: Trunk flexed, Decreased step length - right, Decreased step length - left Gait velocity: decreased General Gait Details: pt ambulated 2 x 50 ft with CGA.   Stairs Stairs: Yes  General stair comments: pt unwilling to attempt at this time due to fatigue    Balance Overall balance assessment: Needs assistance Sitting-balance support: Feet supported Sitting balance-Leahy Scale: Good     Standing balance support: Bilateral upper extremity supported, During functional activity, Reliant on assistive device for balance Standing balance-Leahy Scale: Fair    Cognition Arousal/Alertness: Awake/alert Behavior During Therapy: WFL for tasks assessed/performed Overall Cognitive Status: No family/caregiver present to determine baseline cognitive functioning    General Comments: Pt is A and O x 3  General Comments General comments (skin integrity, edema, etc.): Discussed avauilable assistnce at home. pt is alone during the day when family at work. Relayed to MD concerns. Pt refused STR at DC. Author attempted to contact pt's daughter however had to leave voicemail.      Pertinent Vitals/Pain Pain Assessment Pain Assessment: No/denies pain     PT Goals (current goals can now be found in the care plan section) Acute Rehab PT Goals Patient Stated Goal: go home Progress towards PT goals: Progressing toward goals    Frequency    Min 3X/week      PT Plan Discharge plan needs to be updated       AM-PAC PT "6 Clicks" Mobility   Outcome Measure  Help needed turning from your back to your side while in a flat bed without using bedrails?: A Little Help needed moving from lying on your back to sitting on the side of a flat bed without using bedrails?: A Lot Help needed moving to and from a bed to a chair (including a wheelchair)?: A Little Help needed standing up from a chair using your arms (e.g., wheelchair or bedside chair)?: A Lot Help needed to walk in hospital room?: A Little Help needed climbing 3-5 steps with a railing? : A Lot 6 Click Score: 15    End of Session Equipment Utilized During Treatment: Gait belt Activity Tolerance: Patient limited by fatigue Patient left: in chair;with call bell/phone within reach;with chair alarm set;with nursing/sitter in room Nurse Communication: Mobility status PT Visit Diagnosis: Difficulty in walking, not elsewhere classified (R26.2);Muscle weakness (generalized) (M62.81)     Time: 2952-8413 PT Time Calculation (min) (ACUTE ONLY): 27 min  Charges:  $Gait Training: 8-22 mins $Therapeutic Activity: 8-22 mins                    Jetta Lout PTA 10/02/22, 10:16 AM

## 2022-10-02 NOTE — Progress Notes (Signed)
Occupational Therapy Treatment Patient Details Name: Adam Rios. MRN: [id number] DOB: 12/31/1940 Today's Date: 10/02/2022   History of present illness Pt is an 82 y/o with a medical history significant of recurrent UTIs, cognitive impairment, history of CVA, essential hypertension, renal cell carcinoma, neurogenic bladder, chronic indwelling Foley catheter, some dementia, insomnia, chronic diastolic heart failure and history of right lower extremity cellulitis. He has had a PPM placed and 09/08/22 had a cardiomems implant via RHC to the pulmonary artery for assist in management of HFpEF. MD assessment includes: Severe sepsis and acute UTI secondary to chronic indwelling Foley catheter secondary to Proteus mirabilis infection, acute metabolic encephalopathy, AKI, and normocytic anemia.   OT comments  Pt received semi-reclined in bed. Appearing alert, motivated; willing to work with OT on grooming at sink. T/f CGA with gait belt sit to stand. See flowsheet below for further details of session. Left semi-reclined in bed. with all needs in reach.  Patient will benefit from continued OT while in acute care.    Recommendations for follow up therapy are one component of a multi-disciplinary discharge planning process, led by the attending physician.  Recommendations may be updated based on patient status, additional functional criteria and insurance authorization.    Assistance Recommended at Discharge Frequent or constant Supervision/Assistance  Patient can return home with the following  A little help with bathing/dressing/bathroom;A little help with walking and/or transfers;Assistance with cooking/housework;Direct supervision/assist for medications management;Direct supervision/assist for financial management;Assist for transportation;Help with stairs or ramp for entrance   Equipment Recommendations  None recommended by OT    Recommendations for Other Services      Precautions / Restrictions  Precautions Precautions: Fall Restrictions Weight Bearing Restrictions: No       Mobility Bed Mobility Overal bed mobility: Needs Assistance Bed Mobility: Supine to Sit, Sit to Supine     Supine to sit: Min guard, HOB elevated (with bed rails) Sit to supine: Min guard   General bed mobility comments: extra time    Transfers Overall transfer level: Needs assistance Equipment used: Rolling walker (2 wheels) Transfers: Sit to/from Stand Sit to Stand: Min guard           General transfer comment: from slightly elevated bed; pushing from bed     Balance                                           ADL either performed or assessed with clinical judgement   ADL Overall ADL's : Needs assistance/impaired     Grooming: Wash/dry face;Oral care;Min guard;Standing;Brushing hair;Moderate assistance Grooming Details (indicate cue type and reason): OT CGA during standing for safety (gait belt used). Pt leaning forward at sink with forearms for energy conservation.                             Functional mobility during ADLs: Min guard;Rolling walker (2 wheels) General ADL Comments: Pt with forward flexed trunk posture at RW during mobility.    Extremity/Trunk Assessment Upper Extremity Assessment Upper Extremity Assessment: Generalized weakness   Lower Extremity Assessment Lower Extremity Assessment: Generalized weakness;Defer to PT evaluation        Vision       Perception     Praxis      Cognition Arousal/Alertness: Awake/alert Behavior During Therapy: [facility] for tasks assessed/performed Overall Cognitive Status:  No family/caregiver present to determine baseline cognitive functioning                                 General Comments: Pt is A and O x 3; pleasant and motivated to get out of bed with OT.        Exercises      Shoulder Instructions       General Comments Per PT notes, pt is at home alone when family is  working; pt is high risk for falling while doing ADLs/mobility alone.    Pertinent Vitals/ Pain       Pain Assessment Pain Assessment: No/denies pain  Home Living                                          Prior Functioning/Environment              Frequency  Min 2X/week        Progress Toward Goals  OT Goals(current goals can now be found in the care plan section)  Progress towards OT goals: Progressing toward goals  Acute Rehab OT Goals Patient Stated Goal: Get better OT Goal Formulation: With patient Time For Goal Achievement: 10/15/22 Potential to Achieve Goals: Good ADL Goals Pt Will Perform Grooming: with modified independence;standing Pt Will Transfer to Toilet: with modified independence;ambulating;grab bars Pt Will Perform Toileting - Clothing Manipulation and hygiene: with modified independence;sit to/from stand  Plan Discharge plan remains appropriate    Co-evaluation                 AM-PAC OT "6 Clicks" Daily Activity     Outcome Measure   Help from another person eating meals?: None Help from another person taking care of personal grooming?: A Little Help from another person toileting, which includes using toliet, bedpan, or urinal?: A Little Help from another person bathing (including washing, rinsing, drying)?: A Lot Help from another person to put on and taking off regular upper body clothing?: A Little Help from another person to put on and taking off regular lower body clothing?: A Lot 6 Click Score: 17    End of Session Equipment Utilized During Treatment: Gait belt;Rolling walker (2 wheels)  OT Visit Diagnosis: Unsteadiness on feet (R26.81)   Activity Tolerance Patient tolerated treatment well;Patient limited by fatigue   Patient Left in bed;with call bell/phone within reach;with bed alarm set   Nurse Communication Mobility status        Time: 4782-9562 OT Time Calculation (min): 21 min  Charges: OT  Treatments $Self Care/Home Management : 8-22 mins  Linward Foster, MS, OTR/L   Alvester Morin 10/02/2022, 4:18 PM

## 2022-10-03 DIAGNOSIS — N390 Urinary tract infection, site not specified: Secondary | ICD-10-CM | POA: Diagnosis not present

## 2022-10-03 DIAGNOSIS — A419 Sepsis, unspecified organism: Secondary | ICD-10-CM | POA: Diagnosis not present

## 2022-10-03 LAB — CBC
HCT: 34.4 % — ABNORMAL LOW (ref 39.0–52.0)
Hemoglobin: 10.9 g/dL — ABNORMAL LOW (ref 13.0–17.0)
MCH: 26.7 pg (ref 26.0–34.0)
MCHC: 31.7 g/dL (ref 30.0–36.0)
MCV: 84.3 fL (ref 80.0–100.0)
Platelets: 168 10*3/uL (ref 150–400)
RBC: 4.08 MIL/uL — ABNORMAL LOW (ref 4.22–5.81)
RDW: 16 % — ABNORMAL HIGH (ref 11.5–15.5)
WBC: 6.1 10*3/uL (ref 4.0–10.5)
nRBC: 0 % (ref 0.0–0.2)

## 2022-10-03 LAB — BASIC METABOLIC PANEL
Anion gap: 7 (ref 5–15)
BUN: 17 mg/dL (ref 8–23)
CO2: 22 mmol/L (ref 22–32)
Calcium: 8.3 mg/dL — ABNORMAL LOW (ref 8.9–10.3)
Chloride: 109 mmol/L (ref 98–111)
Creatinine, Ser: 1.06 mg/dL (ref 0.61–1.24)
GFR, Estimated: >60 mL/min (ref >60)
Glucose, Bld: 90 mg/dL (ref 70–99)
Potassium: 3.7 mmol/L (ref 3.5–5.1)
Sodium: 138 mmol/L (ref 135–145)

## 2022-10-03 LAB — CULTURE, BLOOD (ROUTINE X 2)

## 2022-10-03 LAB — MAGNESIUM: Magnesium: 2.1 mg/dL (ref 1.7–2.4)

## 2022-10-03 NOTE — Progress Notes (Signed)
PROGRESS NOTE  [name]. WJX:[id number] DOB: January 04, 1941 DOA: 09/28/2022 PCP: Laqueta Due, MD  Brief History   PMH of HTN, CVA, anxiety RCC, neurogenic bladder with chronic indwelling Foley catheter presented to hospital with complaints of unresponsive episode at home. Reportedly patient was giving blank stare. Currently being treated for UTI.  On 10/03/2022 the patient is more responsive, but still confused. Placement is pending. Home health has refused to see him as when they were caring for him before, they stopped when it appeared that the patient was unable to make decisions for himself. Dr. Allena Katz has determined that the patient is decisional. Awaiting placement. A & P  Severe sepsis acute UTI secondary to chronic indwelling Foley catheter secondary to Proteus mirabilis infection. With tachycardia, tachypnea as well as acute kidney injury meeting criteria for severe sepsis at the time of admission with source of infection is UTI. On IV antibiotics. Foley catheter changed on 4/30. Blood cultures so far negative.  Urine culture growing Proteus.  On oral Keflex based on sensitivity.  Will request longer-term therapy. Sepsis physiology resolved. Mentation improving. Monitor.   Acute metabolic encephalopathy. Unresponsive episode. Original reason for patient's presentation. Mentation improving.  No focal deficit.  Able to follow all commands.  Likely combination of AKI as well as sepsis currently dehydration. Patient does appear to have capacity to make medical decisions as of evaluation on 5/3.  Patient does not appear to be making right decisions as he desires to go home with his severe deconditioning.   AKI. Hypokalemia Resolved. K is 3.7 today.  Creatinine is at baseline at 1.06.  Monitor creatinine, electrolytes, and volume status.   Concern for A-fib with RVR-ruled out. Frequent PVC unknown NSVT Reported to have A-fib at the time of admission. On my evaluation on the  EKG there are P waves seen in the lead II. Currently no evidence of A-fib for now. Most likely sinus tachycardia in the setting of sepsis at the time of admission. Continue Toprol-XL. Echocardiogram performed shows preserved EF. Patient has established discharge with [facility] cardiology and recommend to follow-up with them outpatient.   Chronic diastolic CHF. On torsemide at home.  EF 55 to 60%. Currently euvolemic.  Has frequent visits to [facility] urgent care clinic for IV diuresis. Torsemide was resumed but renal function worsened therefore on hold again.   Normocytic anemia. Hemoglobin 10.9. On admission 13.3. Likely this is dilutional drop, no bleeding. Monitor.   Dementia. On Aricept.  No behavioral issues.   Chronic neuropathy. Gabapentin nightly.   Will continue for now.   HLD. Continuing statin.   History of CVA. On aspirin and Plavix. Which I will continue.   History of HoLEP Chronic indwelling Foley catheter. Patient appears to have history of chronic intermittent catheterization for 10+ years. After his HoLEP in January 2024 he was unable to perform intermittent self-catheterization and had constant urinary leakage. In February 2024 patient's primary urologist approved for placement of a chronic indwelling Foley catheter for incontinence. Patient appears to have recurrent UTIs since then including this admission. Discussed with urology, recommend to continue current management but recommend a voiding trial with urology outpatient since he has a Foley catheter for so long. Foley catheter already upped in size from 61 [place] to 16 [place] and continues to have leakage. Unable to perform urodynamic studies in the hospital. Outpatient follow-up with primary urologist recommended with Dr. Al Corpus. Continue Myrbetriq.   Social issues. It appears that the patient case is currently being evaluated by  APS. There have been concerns that he is unable to make decisions for  himself.  By Dr. Eliane Decree evaluation on 10/02/2022, he seemed to be decisional. In any case, he is not able to go home with home health as home health cannot follow him there, because they have determined that he cannot make his own decisions. TOC is working on placement.   I have seen and examined this patient myself. I have spent 35 minutes in his evaluation and care.  DVT prophylaxis: Lovenox Code Status: Full Code Family Communication: None available Disposition Plan: SNF placement    Susann Lawhorne, DO [facility] Direct contact: see [email or web address]  7PM-7AM contact night coverage as above 10/03/2022, 12:49 PM  LOS: 5 days   Consultants  None  Procedures  None  Antibiotics   Anti-infectives (From admission, onward)    Start     Dose/Rate Route Frequency Ordered Stop   10/01/22 1800  cephALEXin (KEFLEX) capsule 500 mg        500 mg Oral Every 6 hours 10/01/22 1040     10/01/22 1400  cephALEXin (KEFLEX) capsule 500 mg  Status:  Discontinued        500 mg Oral Every 8 hours 10/01/22 0955 10/01/22 1040   09/28/22 2230  cefTRIAXone (ROCEPHIN) 2 g in sodium chloride 0.9 % 100 mL IVPB  Status:  Discontinued        2 g 200 mL/hr over 30 Minutes Intravenous Every 24 hours 09/28/22 2217 10/01/22 0955        Interval History/Subjective  The patient is awake. Somewhat confused this morning, but knows where he is and   Objective   Vitals:  Vitals:   10/03/22 0350 10/03/22 0700  BP: 124/71 117/67  Pulse: 61 62  Resp: 18 18  Temp: 98.2 F (36.8 C) 97.8 F (36.6 C)  SpO2: 94%     Exam:  Constitutional:  Appears calm and comfortable Eyes:  pupils and irises appear normal Normal lids and conjunctivae ENMT:  grossly normal hearing  Lips appear normal external ears, nose appear normal Oropharynx: mucosa, tongue,posterior pharynx appear normal Neck:  neck appears normal, no masses, normal ROM, supple no thyromegaly Respiratory:  CTA bilaterally, no w/r/r.  Respiratory  effort normal. No retractions or accessory muscle use Cardiovascular:  RRR, no m/r/g No LE extremity edema   Normal pedal pulses Abdomen:  Abdomen appears normal; no tenderness or masses No hernias No HSM Musculoskeletal:  Digits/nails BUE: no clubbing, cyanosis, petechiae, infection exam of joints, bones, muscles of at least one of following: head/neck, RUE, LUE, RLE, LLE   strength and tone normal, no atrophy, no abnormal movements No tenderness, masses Normal ROM, no contractures  gait and station Skin:  No rashes, lesions, ulcers palpation of skin: no induration or nodules Neurologic:  CN 2-12 intact Sensation all 4 extremities intact Psychiatric:  Mental status Mood, affect appropriate Orientation to person, place Poor understanding of his medical situation or treatments.  I have personally reviewed the following:   Today's Data   Vitals:   10/03/22 0350 10/03/22 0700  BP: 124/71 117/67  Pulse: 61 62  Resp: 18 18  Temp: 98.2 F (36.8 C) 97.8 F (36.6 C)  SpO2: 94%      Lab Data  CBC    Component Value Date/Time   WBC 6.1 10/03/2022 0434   RBC 4.08 (L) 10/03/2022 0434   HGB 10.9 (L) 10/03/2022 0434   HCT 34.4 (L) 10/03/2022 0434   PLT 168 10/03/2022 0434  MCV 84.3 10/03/2022 0434   MCH 26.7 10/03/2022 0434   MCHC 31.7 10/03/2022 0434   RDW 16.0 (H) 10/03/2022 0434   LYMPHSABS 1.5 09/30/2022 0436   MONOABS 0.6 09/30/2022 0436   EOSABS 0.1 09/30/2022 0436   BASOSABS 0.0 09/30/2022 0436      Latest Ref Rng & Units 10/03/2022    4:34 AM 10/02/2022    5:20 AM 10/01/2022    4:22 AM  BMP  Glucose 70 - 99 mg/dL 90  98  85   BUN 8 - 23 mg/dL 17  18  15    Creatinine 0.61 - 1.24 mg/dL 1.91  4.78  2.95   Sodium 135 - 145 mmol/L 138  138  136   Potassium 3.5 - 5.1 mmol/L 3.7  4.0  3.9   Chloride 98 - 111 mmol/L 109  104  106   CO2 22 - 32 mmol/L 22  26  23    Calcium 8.9 - 10.3 mg/dL 8.3  8.2  8.2      Micro Data   Results for orders placed or  performed during the hospital encounter of 09/28/22  Urine Culture     Status: Abnormal   Collection Time: 09/28/22  9:30 PM   Specimen: Urine, Random  Result Value Ref Range Status   Specimen Description   Final    URINE, RANDOM Performed at [facility], [address] [place], [place] [address]    Special Requests   Final    NONE Reflexed from [id number] Performed at [facility], [address] [place], [place] [address]    Culture >=100,000 COLONIES/mL PROTEUS MIRABILIS (A)  Final   Report Status 10/01/2022 FINAL  Final   Organism ID, Bacteria PROTEUS MIRABILIS (A)  Final      Susceptibility   Proteus mirabilis - MIC*    AMPICILLIN <=2 SENSITIVE Sensitive     CEFAZOLIN 8 SENSITIVE Sensitive     CEFEPIME <=0.12 SENSITIVE Sensitive     CEFTRIAXONE <=0.25 SENSITIVE Sensitive     CIPROFLOXACIN >=4 RESISTANT Resistant     GENTAMICIN <=1 SENSITIVE Sensitive     IMIPENEM 2 SENSITIVE Sensitive     NITROFURANTOIN 128 RESISTANT Resistant     TRIMETH/SULFA <=20 SENSITIVE Sensitive     AMPICILLIN/SULBACTAM <=2 SENSITIVE Sensitive     PIP/TAZO <=4 SENSITIVE Sensitive     * >=100,000 COLONIES/mL PROTEUS MIRABILIS  Culture, blood (Routine X 2) w Reflex to ID Panel     Status: None (Preliminary result)   Collection Time: 09/29/22  2:45 AM   Specimen: BLOOD  Result Value Ref Range Status   Specimen Description BLOOD LEFT HAND  Final   Special Requests   Final    BOTTLES DRAWN AEROBIC ONLY Blood Culture results may not be optimal due to an inadequate volume of blood received in culture bottles   Culture   Final    NO GROWTH 4 DAYS Performed at [facility], [address], [place], [place] [address]    Report Status PENDING  Incomplete  Culture, blood (Routine X 2) w Reflex to ID Panel     Status: None (Preliminary result)   Collection Time: 09/29/22  2:45 AM   Specimen: BLOOD  Result Value Ref Range Status   Specimen Description BLOOD LEFT HAND  Final    Special Requests   Final    BOTTLES DRAWN AEROBIC AND ANAEROBIC Blood Culture results may not be optimal due to an inadequate volume of blood received in culture  bottles   Culture   Final    NO GROWTH 4 DAYS Performed at [facility], [address] [place], [place] [address]    Report Status PENDING  Incomplete     Imaging  CXR - no acute intrathoracic process CT head: No acute intracranial abnormality Stable prominence of the lateral ventricles - likely central predominant atrophy. 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  Cardiology Data  Echocardiogram: LVEF 55-60% , moderate concentric LVH. No regional wall motion abnormalities. Left ventricular diastolic parameters were normal. Moderate dilatation of the right and left atria.  EKG Atrial fibrillation with nonspecific IVCD  Scheduled Meds:  aspirin EC  81 mg Oral Daily   atorvastatin  40 mg Oral Daily   cephALEXin  500 mg Oral Q6H   Chlorhexidine Gluconate Cloth  6 each Topical Daily   clopidogrel  75 mg Oral Daily   cyanocobalamin  1,000 mcg Oral Daily   donepezil  10 mg Oral QHS   enoxaparin (LOVENOX) injection  40 mg Subcutaneous Q24H   finasteride  5 mg Oral Daily   gabapentin  300 mg Oral QHS   magic mouthwash  10 mL Oral QID   metoprolol succinate  25 mg Oral Daily   mirabegron ER  50 mg Oral Daily   pantoprazole  40 mg Oral Daily   Continuous Infusions:  Principal Problem:   Sepsis secondary to UTI (HCC) Active Problems:   Chronic diastolic CHF (congestive heart failure) (HCC)   Complicated UTI (urinary tract infection)   BPH with obstruction/lower urinary tract symptoms   AKI (acute kidney injury) (HCC)   Essential hypertension   GERD (gastroesophageal reflux disease)   Neurogenic bladder   Atrial fibrillation with RVR (HCC)   PAD (peripheral artery disease) (HCC)   History of stroke   Acute metabolic encephalopathy   LOS: 5 days

## 2022-10-04 DIAGNOSIS — N390 Urinary tract infection, site not specified: Secondary | ICD-10-CM | POA: Diagnosis not present

## 2022-10-04 DIAGNOSIS — A419 Sepsis, unspecified organism: Secondary | ICD-10-CM | POA: Diagnosis not present

## 2022-10-04 LAB — CBC WITH DIFFERENTIAL/PLATELET
Abs Immature Granulocytes: 0.03 10*3/uL (ref 0.00–0.07)
Basophils Absolute: 0 10*3/uL (ref 0.0–0.1)
Basophils Relative: 0 %
Eosinophils Absolute: 0.2 10*3/uL (ref 0.0–0.5)
Eosinophils Relative: 3 %
HCT: 35.2 % — ABNORMAL LOW (ref 39.0–52.0)
Hemoglobin: 11 g/dL — ABNORMAL LOW (ref 13.0–17.0)
Immature Granulocytes: 0 %
Lymphocytes Relative: 31 %
Lymphs Abs: 2.1 10*3/uL (ref 0.7–4.0)
MCH: 26.6 pg (ref 26.0–34.0)
MCHC: 31.3 g/dL (ref 30.0–36.0)
MCV: 85.2 fL (ref 80.0–100.0)
Monocytes Absolute: 0.5 10*3/uL (ref 0.1–1.0)
Monocytes Relative: 7 %
Neutro Abs: 4 10*3/uL (ref 1.7–7.7)
Neutrophils Relative %: 59 %
Platelets: 180 10*3/uL (ref 150–400)
RBC: 4.13 MIL/uL — ABNORMAL LOW (ref 4.22–5.81)
RDW: 16 % — ABNORMAL HIGH (ref 11.5–15.5)
WBC: 6.8 10*3/uL (ref 4.0–10.5)
nRBC: 0 % (ref 0.0–0.2)

## 2022-10-04 LAB — BASIC METABOLIC PANEL
Anion gap: 6 (ref 5–15)
BUN: 14 mg/dL (ref 8–23)
CO2: 24 mmol/L (ref 22–32)
Calcium: 8 mg/dL — ABNORMAL LOW (ref 8.9–10.3)
Chloride: 108 mmol/L (ref 98–111)
Creatinine, Ser: 1 mg/dL (ref 0.61–1.24)
GFR, Estimated: >60 mL/min (ref >60)
Glucose, Bld: 85 mg/dL (ref 70–99)
Potassium: 3.8 mmol/L (ref 3.5–5.1)
Sodium: 138 mmol/L (ref 135–145)

## 2022-10-04 LAB — CULTURE, BLOOD (ROUTINE X 2): Culture: NO GROWTH

## 2022-10-04 NOTE — Progress Notes (Signed)
PROGRESS NOTE  Adam Rios. WUJ:[id number] DOB: 1940/10/11 DOA: 09/28/2022 PCP: Laqueta Due, MD  Brief History   PMH of HTN, CVA, anxiety RCC, neurogenic bladder with chronic indwelling Foley catheter presented to hospital with complaints of unresponsive episode at home. Reportedly patient was giving blank stare. Currently being treated for UTI.  On 10/03/2022 the patient is more responsive, but still confused. Placement is pending. Home health has refused to see him as when they were caring for him before, they stopped when it appeared that the patient was unable to make decisions for himself. Dr. Allena Katz has determined that the patient is decisional. Awaiting placement. A & P  Severe sepsis acute UTI secondary to chronic indwelling Foley catheter secondary to Proteus mirabilis infection. With tachycardia, tachypnea as well as acute kidney injury meeting criteria for severe sepsis at the time of admission with source of infection is UTI. On IV antibiotics. Foley catheter changed on 4/30. Blood cultures so far negative.  Urine culture growing Proteus.  On oral Keflex based on sensitivity.  Will request longer-term therapy. Sepsis physiology resolved. Mentation improving. Monitor.   Acute metabolic encephalopathy. Unresponsive episode. Original reason for patient's presentation. Mentation improving.  No focal deficit.  Able to follow all commands.  Likely combination of AKI as well as sepsis currently dehydration. Patient does appear to have capacity to make medical decisions as of evaluation on 5/3.  Patient does not appear to be making right decisions as he desires to go home with his severe deconditioning.   AKI. Hypokalemia Resolved. K is 3.8 today.  Creatinine is at baseline at 1.00 Monitor creatinine, electrolytes, and volume status.   Concern for A-fib with RVR-ruled out. Frequent PVC unknown NSVT Reported to have A-fib at the time of admission. On my evaluation on the EKG  there are P waves seen in the lead II. Currently no evidence of A-fib for now. Most likely sinus tachycardia in the setting of sepsis at the time of admission. Continue Toprol-XL. Echocardiogram performed shows preserved EF at 55-60% with possible infiltrative process such as amyloid/sarcoid. There is moderate concentric LVH. There are no LV regional wall motion abnormalities. Normal diastolic parameters. Patient has established discharge with [facility] cardiology and recommend to follow-up with them outpatient.   Chronic diastolic CHF. On torsemide at home.  EF 55 to 60%. Currently euvolemic.  Has frequent visits to [facility] urgent care clinic for IV diuresis. Torsemide was resumed but renal function worsened therefore on hold again.   Normocytic anemia. Hemoglobin 10.9. On admission 13.3. Likely this is dilutional drop, no bleeding. Monitor.   Dementia. On Aricept.  No behavioral issues.   Chronic neuropathy. Gabapentin nightly.   Will continue for now.   HLD. Continuing statin.   History of CVA. On aspirin and Plavix. Which I will continue.   History of HoLEP Chronic indwelling Foley catheter. Patient appears to have history of chronic intermittent catheterization for 10+ years. After his HoLEP in January 2024 he was unable to perform intermittent self-catheterization and had constant urinary leakage. In February 2024 patient's primary urologist approved for placement of a chronic indwelling Foley catheter for incontinence. Patient appears to have recurrent UTIs since then including this admission. Discussed with urology, recommend to continue current management but recommend a voiding trial with urology outpatient since he has a Foley catheter for so long. Foley catheter already upped in size from 65 [place] to 16 [place] and continues to have leakage. Unable to perform urodynamic studies in the hospital. Outpatient follow-up  with primary urologist recommended with Dr.  Al Corpus. Continue Myrbetriq.   Social issues. It appears that the patient case is currently being evaluated by [facility]PS. There have been concerns that he is unable to make decisions for  himself. By Dr. Eliane Decree evaluation on 10/02/2022, he seemed to be decisional. In any case, he is not able to go home with home health as home health cannot follow him there, because they have determined that he cannot make his own decisions. TOC is working on placement.   I have seen and examined this patient myself. I have spent 32 minutes in his evaluation and care.  DVT prophylaxis: Lovenox Code Status: Full Code Family Communication: None available Disposition Plan: SNF placement    Isis Costanza, DO [facility] Direct contact: see [email or web address]  7PM-7AM contact night coverage as above 10/04/2022, 2:26 PM<  LOS: 5 days   Consultants  None  Procedures  None  Antibiotics   Anti-infectives (From admission, onward)    Start     Dose/Rate Route Frequency Ordered Stop   10/01/22 1800  cephALEXin (KEFLEX) capsule 500 mg        500 mg Oral Every 6 hours 10/01/22 1040     10/01/22 1400  cephALEXin (KEFLEX) capsule 500 mg  Status:  Discontinued        500 mg Oral Every 8 hours 10/01/22 0955 10/01/22 1040   09/28/22 2230  cefTRIAXone (ROCEPHIN) 2 g in sodium chloride 0.9 % 100 mL IVPB  Status:  Discontinued        2 g 200 mL/hr over 30 Minutes Intravenous Every 24 hours 09/28/22 2217 10/01/22 0955        Interval History/Subjective  The patient is awake. No new complaints.  Objective   Vitals:  Vitals:   10/04/22 0841 10/04/22 1230  BP: 129/80 120/68  Pulse: 71 61  Resp: 16 16  Temp: 99.1 F (37.3 C) 98.1 F (36.7 C)  SpO2: 100% 96%     Exam:  Constitutional:  The patient is awake, alert, and oriented x 2. No acute distress. Respiratory:  No increased work of breathing. No wheezes, rales, or rhonchi No tactile fremitus Cardiovascular:  Regular rate and rhythm No murmurs,  ectopy, or gallups. No lateral PMI. No thrills. Abdomen:  Abdomen is soft, non-tender, non-distended No hernias, masses, or organomegaly Normoactive bowel sounds.  Musculoskeletal:  No cyanosis, clubbing, or edema Skin:  No rashes, lesions, ulcers palpation of skin: no induration or nodules Neurologic:  CN 2-12 intact Sensation all 4 extremities intact Psychiatric:  Mental status Mood, affect appropriate Orientation to person, place, but not time. judgment and insight are impaired. Pt does not have an understanding of his medical conditions.   I have personally reviewed the following:   Today's Data   Vitals:   10/04/22 0841 10/04/22 1230  BP: 129/80 120/68  Pulse: 71 61  Resp: 16 16  Temp: 99.1 F (37.3 C) 98.1 F (36.7 C)  SpO2: 100% 96%      Lab Data   CBC    Component Value Date/Time   WBC 6.8 10/04/2022 0521   RBC 4.13 (L) 10/04/2022 0521   HGB 11.0 (L) 10/04/2022 0521   HCT 35.2 (L) 10/04/2022 0521   PLT 180 10/04/2022 0521   MCV 85.2 10/04/2022 0521   MCH 26.6 10/04/2022 0521   MCHC 31.3 10/04/2022 0521   RDW 16.0 (H) 10/04/2022 0521   LYMPHSABS 2.1 10/04/2022 0521   MONOABS 0.5 10/04/2022 0521   EOSABS 0.2  10/04/2022 0521   BASOSABS 0.0 10/04/2022 0521       Latest Ref Rng & Units 10/04/2022    5:21 AM 10/03/2022    4:34 AM 10/02/2022    5:20 AM  BMP  Glucose 70 - 99 mg/dL 85  90  98   BUN 8 - 23 mg/dL 14  17  18    Creatinine 0.61 - 1.24 mg/dL 1.61  0.96  0.45   Sodium 135 - 145 mmol/L 138  138  138   Potassium 3.5 - 5.1 mmol/L 3.8  3.7  4.0   Chloride 98 - 111 mmol/L 108  109  104   CO2 22 - 32 mmol/L 24  22  26    Calcium 8.9 - 10.3 mg/dL 8.0  8.3  8.2    Micro Data   Results for orders placed or performed during the hospital encounter of 09/28/22  Urine Culture     Status: Abnormal   Collection Time: 09/28/22  9:30 PM   Specimen: Urine, Random  Result Value Ref Range Status   Specimen Description   Final    URINE, RANDOM Performed  at [facility], [address] [place], [place] [address]    Special Requests   Final    NONE Reflexed from [id number] Performed at [facility], [address] [place], [place] [address]    Culture >=100,000 COLONIES/mL PROTEUS MIRABILIS (A)  Final   Report Status 10/01/2022 FINAL  Final   Organism ID, Bacteria PROTEUS MIRABILIS (A)  Final      Susceptibility   Proteus mirabilis - MIC*    AMPICILLIN <=2 SENSITIVE Sensitive     CEFAZOLIN 8 SENSITIVE Sensitive     CEFEPIME <=0.12 SENSITIVE Sensitive     CEFTRIAXONE <=0.25 SENSITIVE Sensitive     CIPROFLOXACIN >=4 RESISTANT Resistant     GENTAMICIN <=1 SENSITIVE Sensitive     IMIPENEM 2 SENSITIVE Sensitive     NITROFURANTOIN 128 RESISTANT Resistant     TRIMETH/SULFA <=20 SENSITIVE Sensitive     AMPICILLIN/SULBACTAM <=2 SENSITIVE Sensitive     PIP/TAZO <=4 SENSITIVE Sensitive     * >=100,000 COLONIES/mL PROTEUS MIRABILIS  Culture, blood (Routine X 2) w Reflex to ID Panel     Status: None (Preliminary result)   Collection Time: 09/29/22  2:45 AM   Specimen: BLOOD  Result Value Ref Range Status   Specimen Description BLOOD LEFT HAND  Final   Special Requests   Final    BOTTLES DRAWN AEROBIC ONLY Blood Culture results may not be optimal due to an inadequate volume of blood received in culture bottles   Culture   Final    NO GROWTH 4 DAYS Performed at [facility], [address], [place], [place] [address]    Report Status PENDING  Incomplete  Culture, blood (Routine X 2) w Reflex to ID Panel     Status: None (Preliminary result)   Collection Time: 09/29/22  2:45 AM   Specimen: BLOOD  Result Value Ref Range Status   Specimen Description BLOOD LEFT HAND  Final   Special Requests   Final    BOTTLES DRAWN AEROBIC AND ANAEROBIC Blood Culture results may not be optimal due to an inadequate volume of blood received in culture bottles   Culture   Final    NO GROWTH 4 DAYS Performed at [facility],  [address], [place], [place] [address]    Report Status PENDING  Incomplete     Imaging  CXR - no acute intrathoracic process CT head: No acute intracranial abnormality Stable prominence of the lateral ventricles - likely central predominant atrophy. 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  Cardiology Data  Echocardiogram: LVEF 55-60% , moderate concentric LVH. No regional wall motion abnormalities. Left ventricular diastolic parameters were normal. Moderate dilatation of the right and left atria.  EKG Atrial fibrillation with nonspecific IVCD  Scheduled Meds:  aspirin EC  81 mg Oral Daily   atorvastatin  40 mg Oral Daily   cephALEXin  500 mg Oral Q6H   Chlorhexidine Gluconate Cloth  6 each Topical Daily   clopidogrel  75 mg Oral Daily   cyanocobalamin  1,000 mcg Oral Daily   donepezil  10 mg Oral QHS   enoxaparin (LOVENOX) injection  40 mg Subcutaneous Q24H   finasteride  5 mg Oral Daily   gabapentin  300 mg Oral QHS   magic mouthwash  10 mL Oral QID   metoprolol succinate  25 mg Oral Daily   mirabegron ER  50 mg Oral Daily   pantoprazole  40 mg Oral Daily   Continuous Infusions:  Principal Problem:   Sepsis secondary to UTI (HCC) Active Problems:   Chronic diastolic CHF (congestive heart failure) (HCC)   Complicated UTI (urinary tract infection)   BPH with obstruction/lower urinary tract symptoms   AKI (acute kidney injury) (HCC)   Essential hypertension   GERD (gastroesophageal reflux disease)   Neurogenic bladder   Atrial fibrillation with RVR (HCC)   PAD (peripheral artery disease) (HCC)   History of stroke   Acute metabolic encephalopathy   LOS: 5 days

## 2022-10-05 DIAGNOSIS — N390 Urinary tract infection, site not specified: Secondary | ICD-10-CM | POA: Diagnosis not present

## 2022-10-05 DIAGNOSIS — A419 Sepsis, unspecified organism: Secondary | ICD-10-CM | POA: Diagnosis not present

## 2022-10-05 LAB — VITAMIN D 25 HYDROXY (VIT D DEFICIENCY, FRACTURES): Vit D, 25-Hydroxy: 20.06 ng/mL — ABNORMAL LOW (ref 30–100)

## 2022-10-05 LAB — CREATININE, SERUM
Creatinine, Ser: 0.92 mg/dL (ref 0.61–1.24)
GFR, Estimated: >60 mL/min (ref >60)

## 2022-10-05 LAB — FOLATE: Folate: 6.8 ng/mL (ref >5.9)

## 2022-10-05 MED ORDER — POLYSACCHARIDE IRON COMPLEX 150 MG PO CAPS
150.0000 mg | ORAL_CAPSULE | Freq: Every day | ORAL | Status: DC
  Administered 2022-10-05 – 2022-10-13 (×9): 150 mg via ORAL
  Filled 2022-10-05 (×9): qty 1

## 2022-10-05 MED ORDER — METOPROLOL TARTRATE 25 MG PO TABS
12.5000 mg | ORAL_TABLET | Freq: Two times a day (BID) | ORAL | Status: DC
  Administered 2022-10-05 – 2022-10-13 (×14): 12.5 mg via ORAL
  Filled 2022-10-05 (×16): qty 1

## 2022-10-05 NOTE — Progress Notes (Addendum)
Physical Therapy Treatment Patient Details Name: Adam Rios. MRN: [id number] DOB: [birth year] Today's Date: 10/05/2022   History of Present Illness [name] is an 82 y/o with a medical history significant of recurrent UTIs, cognitive impairment, history of CVA, essential hypertension, renal cell carcinoma, neurogenic bladder, chronic indwelling Foley catheter, some dementia, insomnia, chronic diastolic heart failure and history of right lower extremity cellulitis. He has had a PPM placed and 09/08/22 had a cardiomems implant via RHC to the pulmonary artery for assist in management of HFpEF. MD assessment includes: Severe sepsis and acute UTI secondary to chronic indwelling Foley catheter secondary to Proteus mirabilis infection, acute metabolic encephalopathy, AKI, and normocytic anemia.    PT Comments    Pt in bed on arrival, done eating breakfast, pt agreeable to session. Pt able to come to sitting with great effort, but no assistance needed. Pt balanced well at EOB. Pt partakes in STS c RW, several times from elevated surface, does better when RW is adjusted to optimize UE ergonomics. Pt able to march in place c heavy UE support, but is cautious. Pt AMB to door and back, then sits to rest. Pt progressing toward goals, but remains much weaker than baseline. Will continue to follow.    Recommendations for follow up therapy are one component of a multi-disciplinary discharge planning process, led by the attending physician.  Recommendations may be updated based on patient status, additional functional criteria and insurance authorization.  Follow Up Recommendations       Assistance Recommended at Discharge Frequent or constant Supervision/Assistance  Patient can return home with the following A little help with walking and/or transfers;A lot of help with bathing/dressing/bathroom;Assistance with cooking/housework;Direct supervision/assist for medications management;Direct supervision/assist for  financial management;Assist for transportation;Help with stairs or ramp for entrance   Equipment Recommendations  None recommended by PT    Recommendations for Other Services       Precautions / Restrictions Precautions Precautions: Fall Restrictions Weight Bearing Restrictions: No     Mobility  Bed Mobility Overal bed mobility: Needs Assistance Bed Mobility: Supine to Sit     Supine to sit: Supervision          Transfers Overall transfer level: Needs assistance Equipment used: Rolling walker (2 wheels) Transfers: Sit to/from Stand Sit to Stand: Min guard, From elevated surface                Ambulation/Gait Ambulation/Gait assistance: Min guard Gait Distance (Feet): 40 Feet Assistive device: Rolling walker (2 wheels) Gait Pattern/deviations: Trunk flexed, Decreased step length - right, Decreased step length - left       General Gait Details: to door once, unable to do a second lap; pt agreeable to sit EOB for a while prior to MS returning later   Praxair Mobility    Modified Rankin (Stroke Patients Only)       Balance                                            Cognition Arousal/Alertness: Awake/alert Behavior During Therapy: WFL for tasks assessed/performed Overall Cognitive Status: Within Functional Limits for tasks assessed  Exercises Other Exercises Other Exercises: STS from elevated height x3 Other Exercises: Marching in place x20 a bedside with RW    General Comments        Pertinent Vitals/Pain Pain Assessment Pain Assessment: No/denies pain (typical pain)    Home Living                          Prior Function            PT Goals (current goals can now be found in the care plan section) Acute Rehab PT Goals Patient Stated Goal: go home PT Goal Formulation: With patient Time For Goal Achievement:  10/13/22 Potential to Achieve Goals: Good Progress towards PT goals: Progressing toward goals    Frequency    Min 3X/week      PT Plan Discharge plan needs to be updated    Co-evaluation              AM-PAC PT "6 Clicks" Mobility   Outcome Measure  Help needed turning from your back to your side while in a flat bed without using bedrails?: A Little Help needed moving from lying on your back to sitting on the side of a flat bed without using bedrails?: A Little Help needed moving to and from a bed to a chair (including a wheelchair)?: A Lot Help needed standing up from a chair using your arms (e.g., wheelchair or bedside chair)?: A Lot Help needed to walk in hospital room?: A Little Help needed climbing 3-5 steps with a railing? : A Lot 6 Click Score: 15    End of Session   Activity Tolerance: Patient limited by fatigue;Patient tolerated treatment well Patient left: with call bell/phone within reach;in bed (pt not agreeable to going to recliner, as he was 'abandoned here' over the weekend by NSG for 'I'm not sure how long') Nurse Communication: Mobility status PT Visit Diagnosis: Difficulty in walking, not elsewhere classified (R26.2);Muscle weakness (generalized) (M62.81)     Time: 1610-9604 PT Time Calculation (min) (ACUTE ONLY): 25 min  Charges:  $Therapeutic Exercise: 23-37 mins                    12:03 PM, 10/05/22 Rosamaria Lints, PT, DPT Physical Therapist - [facility]  [phone] (ASCOM)   Arturo Freundlich C 10/05/2022, 12:00 PM

## 2022-10-05 NOTE — Progress Notes (Signed)
[facility] Progress Note  Patient: Adam Rios.    ZOX:[id number]  DOA: 09/28/2022     Date of Service: the patient was seen and examined on 10/05/2022  Chief Complaint  Patient presents with   Altered Mental Status   Brief hospital course: PMH of HTN, CVA, anxiety RCC, neurogenic bladder with chronic indwelling Foley catheter presented to hospital with complaints of unresponsive episode at home. Reportedly patient was giving blank stare. Currently being treated for UTI.  On 10/03/2022 the patient is more responsive, but still confused. Placement is pending. Home health has refused to see him as when they were caring for him before, they stopped when it appeared that the patient was unable to make decisions for himself. Dr. Allena Katz has determined that the patient is decisional. Awaiting placement.   Assessment and Plan:  Severe sepsis acute UTI secondary to chronic indwelling Foley catheter secondary to Proteus mirabilis infection. With tachycardia, tachypnea as well as acute kidney injury meeting criteria for severe sepsis at the time of admission with source of infection is UTI. S/p IV ceftriaxone x 2 doses  Foley catheter changed on 4/30. Blood cultures so far negative.  Urine culture growing Proteus.   On 5/2, started oral Keflex based on sensitivity.  Patient may need long-term prophylactic antibiotics Sepsis physiology resolved. Monitor.   Acute metabolic encephalopathy. Unresponsive episode, Original reason for patient's presentation. Mentation improving.  No focal deficit.  Able to follow all commands.  Likely combination of AKI as well as sepsis currently dehydration. Patient does appear to have capacity to make medical decisions as of evaluation on 5/3.  Patient does not appear to be making right decisions as he desires to go home with his severe deconditioning.   AKI. Hypokalemia Resolved. K is 3.8 today.  Creatinine is at baseline at 1.00 Monitor creatinine,  electrolytes, and volume status.   Concern for A-fib with RVR-ruled out. Frequent PVC unknown NSVT Reported to have A-fib at the time of admission. On my evaluation on the EKG there are P waves seen in the lead II. Currently no evidence of A-fib for now. Most likely sinus tachycardia in the setting of sepsis at the time of admission. Continue Toprol-XL. Echocardiogram performed shows preserved EF at 55-60% with possible infiltrative process such as amyloid/sarcoid. There is moderate concentric LVH. There are no LV regional wall motion abnormalities. Normal diastolic parameters. Patient has established discharge with [facility] cardiology and recommend to follow-up with them outpatient.   Chronic diastolic CHF. On torsemide at home.  EF 55 to 60%. Currently euvolemic.  Has frequent visits to [facility] urgent care clinic for IV diuresis. Torsemide was resumed but renal function worsened therefore on hold again.   Normocytic anemia. Hemoglobin 10.9. On admission 13.3. Likely this is dilutional drop, no bleeding. Monitor.   Dementia. On Aricept.  No behavioral issues.   Chronic neuropathy. Gabapentin nightly.   Will continue for now.   HLD. Continuing statin.   History of CVA. On aspirin and Plavix. Which I will continue.   History of HoLEP Chronic indwelling Foley catheter. Patient appears to have history of chronic intermittent catheterization for 10+ years. After his HoLEP in January 2024 he was unable to perform intermittent self-catheterization and had constant urinary leakage. In February 2024 patient's primary urologist approved for placement of a chronic indwelling Foley catheter for incontinence. Patient appears to have recurrent UTIs since then including this admission. Discussed with urology, recommend to continue current management but recommend a voiding trial with urology  outpatient since he has a Foley catheter for so long. Foley catheter already upped in size from 40 [place]  to 16 [place] and continues to have leakage. Unable to perform urodynamic studies in the hospital. Outpatient follow-up with primary urologist recommended with Dr. Al Corpus. Continue Myrbetriq.   Social issues. It appears that the patient case is currently being evaluated by APS. There have been concerns that he is unable to make decisions for  himself. By Dr. Eliane Decree evaluation on 10/02/2022, he seemed to be decisional. In any case, he is not able to go home with home health as home health cannot follow him there, because they have determined that he cannot make his own decisions. TOC is working on placement.  5/6 DSS is coming to evaluate him today and asked not to release the patient as per TOC.  Body mass index is 28.36 kg/m.  Interventions:       Diet: Heart healthy diet DVT Prophylaxis: Subcutaneous Lovenox   Advance goals of care discussion: Full code  Family Communication: family was not present at bedside, at the time of interview.  The pt provided permission to discuss medical plan with the family. Opportunity was given to ask question and all questions were answered satisfactorily.   Disposition:  Pt is from Home, admitted with unresponsiveness, sepsis, UTI, AKI, chronic Foley catheter, descending, awaiting for placement, TOC consulted for discharge planning, Discharge to SNF vs [facility] with [facility].  Stable to discharge, awaiting for placement, patient wants to go home but he is not able to make the decision.  TOC is following. Pending DSS evaluation today as per TOC  Subjective: No significant events overnight, patient is currently AAO x 3, denied any complaints resting comfortably.  Physical Exam: General: NAD, lying comfortably Appear in no distress, affect appropriate Eyes: PERRLA ENT: Oral Mucosa Clear, moist  Neck: no JVD,  Cardiovascular: S1 and S2 Present, no Murmur,  Respiratory: good respiratory effort, Bilateral Air entry equal and Decreased, no Crackles, no  wheezes Abdomen: Bowel Sound present, Soft and no tenderness,  Skin: no rashes Extremities: no Pedal edema, no calf tenderness Neurologic: without any new focal findings Gait not checked due to patient safety concerns  Vitals:   10/04/22 2333 10/05/22 0356 10/05/22 0755 10/05/22 1211  BP: 118/71 121/69 122/68 113/66  Pulse: 61 (!) 59 (!) 59 65  Resp: 14 16 19 18   Temp: 97.6 F (36.4 C) 98.6 F (37 C) 98.2 F (36.8 C) 98 F (36.7 C)  TempSrc:    Oral  SpO2: 99% 98% 97% 97%  Weight:      Height:        Intake/Output Summary (Last 24 hours) at 10/05/2022 1458 Last data filed at 10/05/2022 0600 Gross per 24 hour  Intake --  Output 900 ml  Net -900 ml   Filed Weights   10/02/22 0205 10/03/22 0439 10/04/22 0500  Weight: 95.5 kg 96 kg 97.5 kg    Data Reviewed: I have personally reviewed and interpreted daily labs, tele strips, imagings as discussed above. I reviewed all nursing notes, pharmacy notes, vitals, pertinent old records I have discussed plan of care as described above with RN and patient/family.  CBC: Recent Labs  Lab 09/28/22 2130 09/29/22 0245 09/30/22 0436 10/01/22 0422 10/02/22 0520 10/03/22 0434 10/04/22 0521  WBC 9.4   < > 5.9 6.8 6.1 6.1 6.8  NEUTROABS 8.1*  --  3.6  --   --   --  4.0  HGB 13.3   < >  10.9* 11.0* 11.3* 10.9* 11.0*  HCT 41.6   < > 34.2* 35.6* 35.7* 34.4* 35.2*  MCV 83.2   < > 84.7 85.8 84.8 84.3 85.2  PLT 184   < > 141* 149* 156 168 180   < > = values in this interval not displayed.   Basic Metabolic Panel: Recent Labs  Lab 09/29/22 0534 09/30/22 0436 09/30/22 1415 10/01/22 0422 10/02/22 0520 10/03/22 0434 10/04/22 0521 10/05/22 0542  NA  --  133* 131* 136 138 138 138  --   K  --  2.9* 3.6 3.9 4.0 3.7 3.8  --   CL  --  103 100 106 104 109 108  --   CO2  --  24 21* 23 26 22 24   --   GLUCOSE  --  86 131* 85 98 90 85  --   BUN  --  16 16 15 18 17 14   --   CREATININE  --  1.06 1.03 1.02 1.37* 1.06 1.00 0.92  CALCIUM  --   8.1* 8.2* 8.2* 8.2* 8.3* 8.0*  --   MG 1.8 2.3  --  2.3 2.1 2.1  --   --     Studies: No results found.  Scheduled Meds:  aspirin EC  81 mg Oral Daily   atorvastatin  40 mg Oral Daily   cephALEXin  500 mg Oral Q6H   Chlorhexidine Gluconate Cloth  6 each Topical Daily   clopidogrel  75 mg Oral Daily   cyanocobalamin  1,000 mcg Oral Daily   donepezil  10 mg Oral QHS   enoxaparin (LOVENOX) injection  40 mg Subcutaneous Q24H   finasteride  5 mg Oral Daily   gabapentin  300 mg Oral QHS   iron polysaccharides  150 mg Oral Daily   magic mouthwash  10 mL Oral QID   metoprolol tartrate  12.5 mg Oral BID   mirabegron ER  50 mg Oral Daily   pantoprazole  40 mg Oral Daily   Continuous Infusions: PRN Meds: acetaminophen, phenol, polyethylene glycol, senna-docusate, traMADol  Time spent: 35 minutes  Author: Gillis Santa. MD [facility] 10/05/2022 2:58 PM  To reach On-call, see care teams to locate the attending and reach out to them via [email or web address]. If 7PM-7AM, please contact night-coverage If you still have difficulty reaching the attending provider, please page the [facility] (Director on Call) for [facility] on amion for assistance.

## 2022-10-05 NOTE — Progress Notes (Signed)
Mobility Specialist - Progress Note   10/05/22 1100  Mobility  Activity Ambulated with assistance in hallway;Transferred from bed to chair;Turned to right side;Turned to left side  Level of Assistance Standby assist, set-up cues, supervision of patient - no hands on  Assistive Device Front wheel walker  Distance Ambulated (ft) 70 ft  Activity Response Tolerated well  $Mobility charge 1 Mobility  Mobility Specialist Start Time (ACUTE ONLY) 1105  Mobility Specialist Stop Time (ACUTE ONLY) 1140  Mobility Specialist Time Calculation (min) (ACUTE ONLY) 35 min     Pt lying in bed upon arrival, utilizing RA. Pt motivated for activity. Rolled L/R with minA for peri-care and gown change prior to OOB. Completed bed mobility with minA. STS with maxA from standard bed height, but only needing modA from recliner. Ambulated in hallway (25' + 45') with minG. VC for increasing step-length. Flexed posture and heavy UE use onto RW. Pt wheeled back to room via chair follow. Left in recliner with alarm set, needs in reach.    Filiberto Pinks Mobility Specialist 10/05/22, 11:55 AM

## 2022-10-05 NOTE — TOC Progression Note (Signed)
Transition of Care (TOC) - Progression Note    Patient Details  Name: Adam Rios. MRN: [id number] Date of Birth: 17-Aug-1940  Transition of Care [facility]) CM/SW Contact  Allena Katz, LCSW Phone Number: 10/05/2022, 10:12 AM  Clinical Narrative:  CSW spoke with Joy at DSS who is requesting we don't release patient today as she needs to come see him. Joy requested the note from MD that pt has decision making capacity. CSW emailed to [employer].          Expected Discharge Plan and Services                                               Social Determinants of Health (SDOH) Interventions SDOH Screenings   Food Insecurity: No Food Insecurity (09/29/2022)  Housing: Low Risk  (09/29/2022)  Transportation Needs: No Transportation Needs (09/29/2022)  Utilities: Not At Risk (09/29/2022)  Tobacco Use: Medium Risk (09/29/2022)    Readmission Risk Interventions    08/11/2022    4:53 PM 06/11/2022    2:19 PM 02/12/2022    2:17 PM  Readmission Risk Prevention Plan  Transportation Screening Complete Complete Complete  PCP or Specialist Appt within 3-5 Days  Complete Complete  HRI or Home Care Consult  Complete Complete  Social Work Consult for Recovery Care Planning/Counseling  Complete Complete  Palliative Care Screening  Not Applicable Not Applicable  Medication Review Oceanographer) Complete Complete Referral to Pharmacy  [facility] or Home Care Consult Complete    Skilled Nursing Facility Complete

## 2022-10-06 DIAGNOSIS — N390 Urinary tract infection, site not specified: Secondary | ICD-10-CM | POA: Diagnosis not present

## 2022-10-06 DIAGNOSIS — A419 Sepsis, unspecified organism: Secondary | ICD-10-CM | POA: Diagnosis not present

## 2022-10-06 LAB — BASIC METABOLIC PANEL
Anion gap: 8 (ref 5–15)
BUN: 13 mg/dL (ref 8–23)
CO2: 21 mmol/L — ABNORMAL LOW (ref 22–32)
Calcium: 8.3 mg/dL — ABNORMAL LOW (ref 8.9–10.3)
Chloride: 107 mmol/L (ref 98–111)
Creatinine, Ser: 0.93 mg/dL (ref 0.61–1.24)
GFR, Estimated: >60 mL/min (ref >60)
Glucose, Bld: 89 mg/dL (ref 70–99)
Potassium: 4 mmol/L (ref 3.5–5.1)
Sodium: 136 mmol/L (ref 135–145)

## 2022-10-06 LAB — CBC
HCT: 34.5 % — ABNORMAL LOW (ref 39.0–52.0)
Hemoglobin: 10.9 g/dL — ABNORMAL LOW (ref 13.0–17.0)
MCH: 26.7 pg (ref 26.0–34.0)
MCHC: 31.6 g/dL (ref 30.0–36.0)
MCV: 84.4 fL (ref 80.0–100.0)
Platelets: 199 10*3/uL (ref 150–400)
RBC: 4.09 MIL/uL — ABNORMAL LOW (ref 4.22–5.81)
RDW: 15.7 % — ABNORMAL HIGH (ref 11.5–15.5)
WBC: 6.9 10*3/uL (ref 4.0–10.5)
nRBC: 0 % (ref 0.0–0.2)

## 2022-10-06 LAB — MAGNESIUM: Magnesium: 2.2 mg/dL (ref 1.7–2.4)

## 2022-10-06 LAB — PHOSPHORUS: Phosphorus: 3 mg/dL (ref 2.5–4.6)

## 2022-10-06 MED ORDER — FOLIC ACID 1 MG PO TABS
1.0000 mg | ORAL_TABLET | Freq: Every day | ORAL | Status: DC
  Administered 2022-10-06 – 2022-10-13 (×8): 1 mg via ORAL
  Filled 2022-10-06 (×8): qty 1

## 2022-10-06 MED ORDER — VITAMIN D (ERGOCALCIFEROL) 1.25 MG (50000 UNIT) PO CAPS
50000.0000 [IU] | ORAL_CAPSULE | ORAL | Status: DC
  Administered 2022-10-06 – 2022-10-13 (×2): 50000 [IU] via ORAL
  Filled 2022-10-06 (×2): qty 1

## 2022-10-06 NOTE — TOC Progression Note (Addendum)
Transition of Care (TOC) - Progression Note    Patient Details  Name: Adam Rios. MRN: [id number] Date of Birth: 1940/09/05  Transition of Care [facility]) CM/SW Contact  Allena Katz, LCSW Phone Number: 10/06/2022, 10:09 AM  Clinical Narrative:   CSW spoke with patient about going to rehab. Pt very resistant to this idea. CSW shared with pt that daughter is uncomfortable taking him home with the level of care that he needs. Pt would not answer a lot of questions when prompted. Pt said he MAY agree to rehab when told there wasn't another option due to daughter being unable to take him home. When asked if pt was urinating on himself at home pt states no that he both defecates and urinates through a catheter.  CSW explained that patient is unable to defecate through his catheter. Upon hearing this, patient responded that he would get up and walk to the bathroom at home. Per previous reports from daughter and APS this was not the case at home.   5/7 10:48am   Referrals sent out for Rehab.   1:00pm CSW spoke with Joy from APS to update her. Joy requested an FL2 be emailed to her. CSW has emailed this. Joy also requested we start a [place] application. CSW to message medicaid specialist.    1:37pm Per Jewel Baize, medicaid specialist, pt has medicaid MQB and that a [place] application has to be done by the county and cannot be started here in the hospital.     Expected Discharge Plan and Services                                               Social Determinants of Health (SDOH) Interventions SDOH Screenings   Food Insecurity: No Food Insecurity (09/29/2022)  Housing: Low Risk  (09/29/2022)  Transportation Needs: No Transportation Needs (09/29/2022)  Utilities: Not At Risk (09/29/2022)  Tobacco Use: Medium Risk (09/29/2022)    Readmission Risk Interventions    08/11/2022    4:53 PM 06/11/2022    2:19 PM 02/12/2022    2:17 PM  Readmission Risk Prevention Plan  Transportation  Screening Complete Complete Complete  PCP or Specialist Appt within 3-5 Days  Complete Complete  HRI or Home Care Consult  Complete Complete  Social Work Consult for Recovery Care Planning/Counseling  Complete Complete  Palliative Care Screening  Not Applicable Not Applicable  Medication Review Oceanographer) Complete Complete Referral to Pharmacy  [facility] or Home Care Consult Complete    Skilled Nursing Facility Complete

## 2022-10-06 NOTE — [place] FL2 (Signed)
[place] MEDICAID FL2 LEVEL OF CARE FORM     IDENTIFICATION  Patient Name: Adam Rios. Birthdate: 07-Nov-1940 Sex: male Admission Date (Current Location): 09/28/2022  [facility] and [place] Number:  Chiropodist and Address:  [facility], [address], [place], [place] [address]      Provider Number: [id number]  Attending Physician Name and Address:  Gillis Santa, MD  Relative Name and Phone Number:  EMRAH, CORRIEA (Daughter) [phone]    Current Level of Care: Hospital Recommended Level of Care: Skilled Nursing Facility Prior Approval Number:    Date Approved/Denied:   PASRR Number: [id number]  Discharge Plan: SNF    Current Diagnoses: Patient Active Problem List   Diagnosis Date Noted   Acute metabolic encephalopathy 09/29/2022   Atrial fibrillation with RVR (HCC) 09/28/2022   PAD (peripheral artery disease) (HCC) 09/28/2022   History of stroke 09/28/2022   Sepsis secondary to UTI (HCC) 09/28/2022   Generalized weakness 08/23/2022   Hypokalemia 08/14/2022   AKI (acute kidney injury) (HCC) 08/09/2022   Leukocytosis 08/09/2022   Severe sepsis (HCC) 08/09/2022   Heart failure with preserved ejection fraction (HCC) 08/09/2022   Acute hemorrhagic cystitis 06/11/2022   Iron deficiency anemia due to chronic blood loss 06/11/2022   Dyslipidemia 06/11/2022   GERD without esophagitis 06/11/2022   Lower extremity cellulitis 06/11/2022   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   Second degree heart block 05/02/2022   Acute on chronic congestive heart failure (HCC) 04/30/2022   Chronic diastolic CHF (congestive heart failure) (HCC) 04/30/2022   Obesity (BMI 30-39.9) 04/30/2022   Chronic indwelling Foley catheter 04/30/2022   Septic shock (HCC) 02/11/2022   Pressure injury of skin 02/11/2022   Bacteremia 11/22/2021   Hematuria 11/21/2021   Renal cell carcinoma s/p nephrectomy, left 11/21/2021   History of recurrent UTI and ESBL  UTI  11/21/2021   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   Complicated UTI (urinary tract infection) 09/09/2021   Neuropathy 09/09/2021   History of CVA (cerebrovascular accident) 09/09/2021   Fall 05/14/2021   Frailty 03/04/2018   Acute encephalopathy 01/30/2018   GERD (gastroesophageal reflux disease) 04/05/2017   Pain 04/05/2017   Weakness 04/05/2017   Recurrent UTI 01/20/2016   Essential hypertension 07/31/2014   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 04/10/2014   Ventral hernia without obstruction or gangrene 04/10/2014   Acute low back pain without sciatica 12/26/2013   Fusion of spine of lumbar region 12/26/2013   Anasarca 09/26/2013   Hemorrhoid 09/26/2013   Neurogenic bladder 09/26/2013   Renal cell cancer (HCC) 09/26/2013   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   Lower urinary tract infectious disease 10/28/2012    Orientation RESPIRATION BLADDER Height & Weight     Self, Time, Situation, Place  Normal Incontinent, External catheter Weight: 213 lb 13.5 oz (97 kg) Height:  6\' 1"  (185.4 cm)  BEHAVIORAL SYMPTOMS/MOOD NEUROLOGICAL BOWEL NUTRITION STATUS      Continent Diet  AMBULATORY STATUS COMMUNICATION OF NEEDS Skin   Extensive Assist Verbally  (stage 1 sacral ulcer buttocks, foam dressing daily,  L toe abrasion scabbed over.)                       Personal Care Assistance Level of Assistance  Bathing, Feeding, Dressing Bathing Assistance: Maximum assistance Feeding assistance: Maximum assistance Dressing Assistance: Maximum assistance     Functional Limitations Info  Sight, Hearing, Speech Sight Info: Adequate Hearing Info: Adequate Speech Info: Adequate  SPECIAL CARE FACTORS FREQUENCY  PT (By licensed PT), OT (By licensed OT)     PT Frequency: 5 times a week OT Frequency: 5 times a week            Contractures Contractures Info: Not present    Additional Factors Info  Code Status, Allergies, Isolation Precautions Code Status  Info: FULL Allergies Info: Empagliflozin  Fentanyl  Oxycodone     Isolation Precautions Info: ESBL, MRSA, MDR     Current Medications (10/06/2022):  This is the current hospital active medication list Current Facility-Administered Medications  Medication Dose Route Frequency Provider Last Rate Last Admin   acetaminophen (TYLENOL) tablet 500 mg  500 mg Oral Q6H PRN Norins, Rosalyn Gess, MD   500 mg at 10/02/22 0981   aspirin EC tablet 81 mg  81 mg Oral Daily Norins, Rosalyn Gess, MD   81 mg at 10/06/22 1022   atorvastatin (LIPITOR) tablet 40 mg  40 mg Oral Daily Norins, Rosalyn Gess, MD   40 mg at 10/06/22 1022   cephALEXin (KEFLEX) capsule 500 mg  500 mg Oral Q6H Jaynie Bream, RPH   500 mg at 10/06/22 1914   Chlorhexidine Gluconate Cloth 2 % PADS 6 each  6 each Topical Daily Lurene Shadow, MD   6 each at 10/05/22 0911   clopidogrel (PLAVIX) tablet 75 mg  75 mg Oral Daily Norins, Rosalyn Gess, MD   75 mg at 10/06/22 1022   cyanocobalamin (VITAMIN B12) tablet 1,000 mcg  1,000 mcg Oral Daily Jacques Navy, MD   1,000 mcg at 10/06/22 1022   donepezil (ARICEPT) tablet 10 mg  10 mg Oral QHS Jacques Navy, MD   10 mg at 10/05/22 2029   enoxaparin (LOVENOX) injection 40 mg  40 mg Subcutaneous Q24H Norins, Rosalyn Gess, MD   40 mg at 10/06/22 1022   finasteride (PROSCAR) tablet 5 mg  5 mg Oral Daily Norins, Rosalyn Gess, MD   5 mg at 10/06/22 1022   folic acid (FOLVITE) tablet 1 mg  1 mg Oral Daily Gillis Santa, MD   1 mg at 10/06/22 1021   gabapentin (NEURONTIN) capsule 300 mg  300 mg Oral QHS Norins, Rosalyn Gess, MD   300 mg at 10/05/22 2029   iron polysaccharides (NIFEREX) capsule 150 mg  150 mg Oral Daily Gillis Santa, MD   150 mg at 10/06/22 1023   magic mouthwash  10 mL Oral QID Rolly Salter, MD   10 mL at 10/06/22 1023   metoprolol tartrate (LOPRESSOR) tablet 12.5 mg  12.5 mg Oral BID Gillis Santa, MD   12.5 mg at 10/06/22 1021   mirabegron ER (MYRBETRIQ) tablet 50 mg  50 mg Oral Daily Norins,  Rosalyn Gess, MD   50 mg at 10/06/22 1023   pantoprazole (PROTONIX) EC tablet 40 mg  40 mg Oral Daily Norins, Rosalyn Gess, MD   40 mg at 10/06/22 1022   phenol (CHLORASEPTIC) mouth spray 1 spray  1 spray Mouth/Throat PRN Lurene Shadow, MD       polyethylene glycol (MIRALAX / GLYCOLAX) packet 17 g  17 g Oral Daily PRN Norins, Rosalyn Gess, MD       senna-docusate (Senokot-S) tablet 1 tablet  1 tablet Oral BID PRN Jaynie Bream, RPH   1 tablet at 10/06/22 1022   traMADol (ULTRAM) tablet 50 mg  50 mg Oral Q8H PRN Jacques Navy, MD   50 mg at 10/04/22 2125   Vitamin D (Ergocalciferol) (  DRISDOL) 1.25 MG (50000 UNIT) capsule 50,000 Units  50,000 Units Oral Q7 days Gillis Santa, MD         Discharge Medications: Please see discharge summary for a list of discharge medications.  Relevant Imaging Results:  Relevant Lab Results:   Additional Information [id number]  Allena Katz, LCSW

## 2022-10-06 NOTE — Progress Notes (Addendum)
Mobility Specialist - Progress Note   10/06/22 1400  Mobility  Activity Ambulated with assistance in hallway;Transferred from chair to bed  Level of Assistance Standby assist, set-up cues, supervision of patient - no hands on  Assistive Device Front wheel walker  Distance Ambulated (ft) 125 ft  Activity Response Tolerated well  $Mobility charge 1 Mobility  Mobility Specialist Start Time (ACUTE ONLY) 1405  Mobility Specialist Stop Time (ACUTE ONLY) 1438  Mobility Specialist Time Calculation (min) (ACUTE ONLY) 33 min     Pt sitting in recliner upon arrival, utilizing RA. Pt STS first attempt from recliner with maxA +2 but does progress to min-modA +2 during session. Post lean. Pt ambulated in hallway (30' + 45' + 50') with minG. Pt reports stiffness and weakness in RUE. Pain in B feet d/t neuropathy. Noted pt veering to L during ambulation this date. O2 mid 90s throughout ambulation, HR low-mid 70s. Pt returned to bed at end of session, assist on LE to return supine. Alarm set, needs in reach. RN notified.    Filiberto Pinks Mobility Specialist 10/06/22, 2:54 PM

## 2022-10-06 NOTE — Progress Notes (Signed)
[facility] Progress Note  Patient: Adam Rios.    NWG:[id number]  DOA: 09/28/2022     Date of Service: the patient was seen and examined on 10/06/2022  Chief Complaint  Patient presents with   Altered Mental Status   Brief hospital course: PMH of HTN, CVA, anxiety RCC, neurogenic bladder with chronic indwelling Foley catheter presented to hospital with complaints of unresponsive episode at home. Reportedly patient was giving blank stare. Currently being treated for UTI.  On 10/03/2022 the patient is more responsive, but still confused. Placement is pending. Home health has refused to see him as when they were caring for him before, they stopped when it appeared that the patient was unable to make decisions for himself. Dr. Allena Katz has determined that the patient is decisional. Awaiting placement.   Assessment and Plan:  Severe sepsis acute UTI secondary to chronic indwelling Foley catheter secondary to Proteus mirabilis infection. With tachycardia, tachypnea as well as acute kidney injury meeting criteria for severe sepsis at the time of admission with source of infection is UTI. S/p IV ceftriaxone x 2 doses  Foley catheter changed on 4/30. Blood cultures so far negative.  Urine culture growing Proteus.   On 5/2, started oral Keflex based on sensitivity.  Patient may need long-term prophylactic antibiotics Sepsis physiology resolved. Monitor.   Acute metabolic encephalopathy. Unresponsive episode, Original reason for patient's presentation. Mentation improving.  No focal deficit.  Able to follow all commands.  Likely combination of AKI as well as sepsis currently dehydration. Patient does appear to have capacity to make medical decisions as of evaluation on 5/3.  Patient does not appear to be making right decisions as he desires to go home with his severe deconditioning.   AKI. Hypokalemia Resolved. K is 4.0 today.  Creatinine is at baseline at 1.00 Monitor creatinine,  electrolytes, and volume status.   Concern for A-fib with RVR-ruled out. Frequent PVC unknown NSVT Reported to have A-fib at the time of admission. On my evaluation on the EKG there are P waves seen in the lead II. Currently no evidence of A-fib for now. Most likely sinus tachycardia in the setting of sepsis at the time of admission. Continue Toprol-XL. Echocardiogram performed shows preserved EF at 55-60% with possible infiltrative process such as amyloid/sarcoid. There is moderate concentric LVH. There are no LV regional wall motion abnormalities. Normal diastolic parameters. Patient has established discharge with [facility] cardiology and recommend to follow-up with them outpatient.   Chronic diastolic CHF. On torsemide at home.  EF 55 to 60%. Currently euvolemic.  Has frequent visits to [facility] urgent care clinic for IV diuresis. Torsemide was resumed but renal function worsened therefore on hold again.   Normocytic anemia. Hemoglobin 10.9. On admission 13.3. Likely this is dilutional drop, no bleeding. Monitor.   Folic acid level 6.8, at lower end, started folic acid 1 mg p.o. daily.  Repeat folic acid level after 3 months  Vitamin D deficiency: started vitamin D 50,000 units p.o. weekly, follow with PCP to repeat vitamin D level after 3 to 6 months.  Dementia. On Aricept.  No behavioral issues.   Chronic neuropathy. Gabapentin nightly.   Will continue for now.   HLD. Continuing statin.   History of CVA. On aspirin and Plavix. Which I will continue.   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 Patient appears to have history of chronic intermittent catheterization for 10+ years. After his HoLEP in January  2024 he was unable to perform intermittent self-catheterization and had constant urinary leakage. In February 2024 patient's primary urologist approved for placement of a chronic indwelling Foley catheter for  incontinence. Patient appears to have recurrent UTIs since then including this admission. Discussed with urology, recommend to continue current management but recommend a voiding trial with urology outpatient since he has a Foley catheter for so long. Foley catheter already upped in size from 23 [place] to 16 [place] and continues to have leakage. Unable to perform urodynamic studies in the hospital. Outpatient follow-up with primary urologist recommended with Dr. Al Corpus. Continue Myrbetriq. 5/7 d/w urologist Dr Lonna Cobb, rec f/u with his primary urologist as an outpatient, no intervention.  Social issues. It appears that the patient case is currently being evaluated by APS. There have been concerns that he is unable to make decisions for  himself. By Dr. Eliane Decree evaluation on 10/02/2022, he seemed to be decisional. In any case, he is not able to go home with home health as home health cannot follow him there, because they have determined that he cannot make his own decisions. TOC is working on placement.  5/6 DSS is coming to evaluate him today and asked not to release the patient as per TOC.  Body mass index is 28.36 kg/m.  Interventions:       Diet: Heart healthy diet DVT Prophylaxis: Subcutaneous Lovenox   Advance goals of care discussion: Full code  Family Communication: family was not present at bedside, at the time of interview.  The pt provided permission to discuss medical plan with the family. Opportunity was given to ask question and all questions were answered satisfactorily.   Disposition:  Pt is from Home, admitted with unresponsiveness, sepsis, UTI, AKI, chronic Foley catheter, descending, awaiting for placement, TOC consulted for discharge planning, Discharge to SNF vs Home with [facility].  Stable to discharge, awaiting for placement, patient wants to go home but he is not able to make the decision.  TOC is following for SNF placement. Pending DSS evaluation today as per  TOC  Subjective: No significant events overnight, patient is currently AAO x 3, resting comfortably.  Patient is complaining of leaking of urine around the catheter which he does not like, would like it to be fixed. Discussed with urologist, nothing can be done, urine is leaking because of his prostate removal done at [facility].  Urologist recommended to follow at [facility] with his own primary urologist.   Physical Exam: General: NAD, lying comfortably Appear in no distress, affect appropriate Eyes: PERRLA ENT: Oral Mucosa Clear, moist  Neck: no JVD,  Cardiovascular: S1 and S2 Present, no Murmur,  Respiratory: good respiratory effort, Bilateral Air entry equal and Decreased, no Crackles, no wheezes Abdomen: Bowel Sound present, Soft and no tenderness,  Skin: no rashes Extremities: no Pedal edema, no calf tenderness Neurologic: without any new focal findings Gait not checked due to patient safety concerns  Vitals:   10/06/22 0429 10/06/22 0500 10/06/22 0850 10/06/22 1200  BP: 117/68  120/62 114/64  Pulse: (!) 59  (!) 58 (!) 59  Resp: 18  18 18   Temp: 98.1 F (36.7 C)  97.6 F (36.4 C) 97.8 F (36.6 C)  TempSrc: Oral  Oral   SpO2: 97%  96% 98%  Weight:  97 kg    Height:       No intake or output data in the 24 hours ending 10/06/22 1542  Filed Weights   10/03/22 0439 10/04/22 0500 10/06/22 0500  Weight:  96 kg 97.5 kg 97 kg    Data Reviewed: I have personally reviewed and interpreted daily labs, tele strips, imagings as discussed above. I reviewed all nursing notes, pharmacy notes, vitals, pertinent old records I have discussed plan of care as described above with RN and patient/family.  CBC: Recent Labs  Lab 09/30/22 0436 10/01/22 0422 10/02/22 0520 10/03/22 0434 10/04/22 0521 10/06/22 0415  WBC 5.9 6.8 6.1 6.1 6.8 6.9  NEUTROABS 3.6  --   --   --  4.0  --   HGB 10.9* 11.0* 11.3* 10.9* 11.0* 10.9*  HCT 34.2* 35.6* 35.7* 34.4* 35.2* 34.5*  MCV 84.7 85.8 84.8 84.3 85.2  84.4  PLT 141* 149* 156 168 180 199   Basic Metabolic Panel: Recent Labs  Lab 09/30/22 0436 09/30/22 1415 10/01/22 0422 10/02/22 0520 10/03/22 0434 10/04/22 0521 10/05/22 0542 10/06/22 0415  NA 133*   < > 136 138 138 138  --  136  K 2.9*   < > 3.9 4.0 3.7 3.8  --  4.0  CL 103   < > 106 104 109 108  --  107  CO2 24   < > 23 26 22 24   --  21*  GLUCOSE 86   < > 85 98 90 85  --  89  BUN 16   < > 15 18 17 14   --  13  CREATININE 1.06   < > 1.02 1.37* 1.06 1.00 0.92 0.93  CALCIUM 8.1*   < > 8.2* 8.2* 8.3* 8.0*  --  8.3*  MG 2.3  --  2.3 2.1 2.1  --   --  2.2  PHOS  --   --   --   --   --   --   --  3.0   < > = values in this interval not displayed.    Studies: No results found.  Scheduled Meds:  aspirin EC  81 mg Oral Daily   atorvastatin  40 mg Oral Daily   cephALEXin  500 mg Oral Q6H   Chlorhexidine Gluconate Cloth  6 each Topical Daily   clopidogrel  75 mg Oral Daily   cyanocobalamin  1,000 mcg Oral Daily   donepezil  10 mg Oral QHS   enoxaparin (LOVENOX) injection  40 mg Subcutaneous Q24H   finasteride  5 mg Oral Daily   folic acid  1 mg Oral Daily   gabapentin  300 mg Oral QHS   iron polysaccharides  150 mg Oral Daily   magic mouthwash  10 mL Oral QID   metoprolol tartrate  12.5 mg Oral BID   mirabegron ER  50 mg Oral Daily   pantoprazole  40 mg Oral Daily   Vitamin D (Ergocalciferol)  50,000 Units Oral Q7 days   Continuous Infusions: PRN Meds: acetaminophen, phenol, polyethylene glycol, senna-docusate, traMADol  Time spent: 35 minutes  Author: Gillis Santa. MD [facility] 10/06/2022 3:42 PM  To reach On-call, see care teams to locate the attending and reach out to them via [email or web address]. If 7PM-7AM, please contact night-coverage If you still have difficulty reaching the attending provider, please page the [facility] (Director on Call) for [facility] on amion for assistance.

## 2022-10-07 ENCOUNTER — Ambulatory Visit: Payer: Self-pay | Admitting: Urology

## 2022-10-07 DIAGNOSIS — N390 Urinary tract infection, site not specified: Secondary | ICD-10-CM | POA: Diagnosis not present

## 2022-10-07 DIAGNOSIS — Z1330 Encounter for screening examination for mental health and behavioral disorders, unspecified: Secondary | ICD-10-CM

## 2022-10-07 DIAGNOSIS — A419 Sepsis, unspecified organism: Secondary | ICD-10-CM | POA: Diagnosis not present

## 2022-10-07 NOTE — TOC Progression Note (Addendum)
Transition of Care (TOC) - Progression Note    Patient Details  Name: Adam Rios. MRN: [id number] Date of Birth: 06/08/1940  Transition of Care [facility]) CM/SW Contact  Adam Katz, LCSW Phone Number: 10/07/2022, 2:07 PM  Clinical Narrative:   Referral made to [facility]. [facility] to contact patient. CSW spoke with Adam Rios with DSS. Adam Rios is going to contact daughter about getting a Special assistance application started for patient. MD got a second opinion from urology and they state nothing can be done for patients catheter leaking since bladder spasm medication has already been initiated.  MD has ordered a psych consult to assess patients capacity. Per Adam Rios at DSS, they cannot assist unless pt lacks capacity.       Expected Discharge Plan and Services                                               Social Determinants of Health (SDOH) Interventions SDOH Screenings   Food Insecurity: No Food Insecurity (09/29/2022)  Housing: Low Risk  (09/29/2022)  Transportation Needs: No Transportation Needs (09/29/2022)  Utilities: Not At Risk (09/29/2022)  Tobacco Use: Medium Risk (09/29/2022)    Readmission Risk Interventions    08/11/2022    4:53 PM 06/11/2022    2:19 PM 02/12/2022    2:17 PM  Readmission Risk Prevention Plan  Transportation Screening Complete Complete Complete  PCP or Specialist Appt within 3-5 Days  Complete Complete  HRI or Home Care Consult  Complete Complete  Social Work Consult for Recovery Care Planning/Counseling  Complete Complete  Palliative Care Screening  Not Applicable Not Applicable  Medication Review Oceanographer) Complete Complete Referral to Pharmacy  [facility] or Home Care Consult Complete    Skilled Nursing Facility Complete

## 2022-10-07 NOTE — Plan of Care (Signed)

## 2022-10-07 NOTE — Progress Notes (Signed)
[facility] Progress Note  Patient: Adam Rios.    WJX:[id number]  DOA: 09/28/2022     Date of Service: the patient was seen and examined on 10/07/2022  Chief Complaint  Patient presents with   Altered Mental Status   Brief hospital course: PMH of HTN, CVA, anxiety RCC, neurogenic bladder with chronic indwelling Foley catheter presented to hospital with complaints of unresponsive episode at home. Reportedly patient was giving blank stare. Currently being treated for UTI.  On 10/03/2022 the patient is more responsive, but still confused. Placement is pending. Home health has refused to see him as when they were caring for him before, they stopped when it appeared that the patient was unable to make decisions for himself. Dr. Allena Katz has determined that the patient is decisional. Awaiting placement.   Assessment and Plan:  Severe sepsis acute UTI secondary to chronic indwelling Foley catheter secondary to Proteus mirabilis infection. With tachycardia, tachypnea as well as acute kidney injury meeting criteria for severe sepsis at the time of admission with source of infection is UTI. S/p IV ceftriaxone x 2 doses  Foley catheter changed on 4/30. Blood cultures so far negative.  Urine culture growing Proteus.   On 5/2, started oral Keflex based on sensitivity.  Patient may need long-term prophylactic antibiotics Sepsis physiology resolved. Monitor.   Acute metabolic encephalopathy, Resolved  Unresponsive episode, Original reason for patient's presentation. Mentation improving.  No focal deficit.  Able to follow all commands.  Likely combination of AKI as well as sepsis currently dehydration. Patient does appear to have capacity to make medical decisions as of evaluation on 5/3.  Patient does not appear to be making right decisions as he desires to go home with his severe deconditioning. 5/8 consulted psychiatrist to eval patient for decision-making capacity as per requirement by APS so  that we can proceed further   AKI. Hypokalemia Resolved. K is 4.0  Creatinine is at baseline at 1.00 Monitor creatinine, electrolytes, and volume status.   Concern for A-fib with RVR-ruled out. Frequent PVC unknown NSVT Reported to have A-fib at the time of admission. On my evaluation on the EKG there are P waves seen in the lead II. Currently no evidence of A-fib for now. Most likely sinus tachycardia in the setting of sepsis at the time of admission. Continue Toprol-XL. Echocardiogram performed shows preserved EF at 55-60% with possible infiltrative process such as amyloid/sarcoid. There is moderate concentric LVH. There are no LV regional wall motion abnormalities. Normal diastolic parameters. Patient has established discharge with [facility] cardiology and recommend to follow-up with them outpatient.   Chronic diastolic CHF. On torsemide at home.  EF 55 to 60%. Currently euvolemic.  Has frequent visits to [facility] urgent care clinic for IV diuresis. Torsemide was resumed but renal function worsened therefore on hold again.   Normocytic anemia. Hemoglobin 10.9. On admission 13.3. Likely this is dilutional drop, no bleeding. Monitor.   Folic acid level 6.8, at lower end, started folic acid 1 mg p.o. daily.  Repeat folic acid level after 3 months  Vitamin D deficiency: started vitamin D 50,000 units p.o. weekly, follow with PCP to repeat vitamin D level after 3 to 6 months.  Dementia. On Aricept.  No behavioral issues.   Chronic neuropathy. Gabapentin nightly.   Will continue for now.   HLD. Continuing statin.   History of CVA. On aspirin and Plavix. Which I will continue.   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 Patient appears to have history of chronic intermittent catheterization for 10+ years. After his HoLEP in January 2024 he was unable to perform intermittent self-catheterization and had constant urinary  leakage. In February 2024 patient's primary urologist approved for placement of a chronic indwelling Foley catheter for incontinence. Patient appears to have recurrent UTIs since then including this admission. Discussed with urology, recommend to continue current management but recommend a voiding trial with urology outpatient since he has a Foley catheter for so long. Foley catheter already upped in size from 79 [place] to 16 [place] and continues to have leakage. Unable to perform urodynamic studies in the hospital. Outpatient follow-up with primary urologist recommended with Dr. Al Corpus. Continue Myrbetriq. 5/7 d/w urologist Dr Lonna Cobb, rec f/u with his primary urologist as an outpatient, no intervention. 5/8 discussed with another urologist Dr. Apolinar Junes who suggested to do bladder scan to rule out urinary retention and kinking of Foley, continue Myrbetriq, no any other intervention.    Social issues. It appears that the patient case is currently being evaluated by APS. There have been concerns that he is unable to make decisions for  himself. By Dr. Eliane Decree evaluation on 10/02/2022, he seemed to be decisional. In any case, he is not able to go home with home health as home health cannot follow him there, because they have determined that he cannot make his own decisions. TOC is working on placement.  5/6 DSS is coming to evaluate him today and asked not to release the patient as per TOC.  Body mass index is 28.36 kg/m.  Interventions:       Diet: Heart healthy diet DVT Prophylaxis: Subcutaneous Lovenox   Advance goals of care discussion: Full code  Family Communication: family was not present at bedside, at the time of interview.  The pt provided permission to discuss medical plan with the family. Opportunity was given to ask question and all questions were answered satisfactorily.   Disposition:  Pt is from Home, admitted with unresponsiveness, sepsis, UTI, AKI, chronic Foley catheter,  descending, awaiting for placement, TOC consulted for discharge planning, Discharge to SNF vs Home with [facility].  Stable to discharge, awaiting for placement, patient wants to go home but he is not able to make the decision.  TOC is following for SNF placement. Pending DSS evaluation today as per TOC  Subjective: No significant events overnight, patient was sitting comfortably in the recliner, still urine is leaking, denies any other complaints.  Physical Exam: General: NAD, lying comfortably Appear in no distress, affect appropriate Eyes: PERRLA ENT: Oral Mucosa Clear, moist  Neck: no JVD,  Cardiovascular: S1 and S2 Present, no Murmur,  Respiratory: good respiratory effort, Bilateral Air entry equal and Decreased, no Crackles, no wheezes Abdomen: Bowel Sound present, Soft and no tenderness,  Skin: no rashes Extremities: no Pedal edema, no calf tenderness Neurologic: without any new focal findings Gait not checked due to patient safety concerns  Vitals:   10/06/22 2330 10/07/22 0230 10/07/22 0425 10/07/22 0700  BP: 123/72  114/62 122/61  Pulse: (!) 59  (!) 59 62  Resp: 16  16 17   Temp: 98.1 F (36.7 C)  98.6 F (37 C) 98.5 F (36.9 C)  TempSrc:      SpO2: 99%  98% 98%  Weight:  94.8 kg    Height:        Intake/Output Summary (Last 24 hours) at 10/07/2022 1349 Last data filed at 10/07/2022 0600 Gross per 24 hour  Intake --  Output 1675 ml  Net -1675 ml    Filed Weights   10/04/22 0500 10/06/22 0500 10/07/22 0230  Weight: 97.5 kg 97 kg 94.8 kg    Data Reviewed: I have personally reviewed and interpreted daily labs, tele strips, imagings as discussed above. I reviewed all nursing notes, pharmacy notes, vitals, pertinent old records I have discussed plan of care as described above with RN and patient/family.  CBC: Recent Labs  Lab 10/01/22 0422 10/02/22 0520 10/03/22 0434 10/04/22 0521 10/06/22 0415  WBC 6.8 6.1 6.1 6.8 6.9  NEUTROABS  --   --   --  4.0  --   HGB  11.0* 11.3* 10.9* 11.0* 10.9*  HCT 35.6* 35.7* 34.4* 35.2* 34.5*  MCV 85.8 84.8 84.3 85.2 84.4  PLT 149* 156 168 180 199   Basic Metabolic Panel: Recent Labs  Lab 10/01/22 0422 10/02/22 0520 10/03/22 0434 10/04/22 0521 10/05/22 0542 10/06/22 0415  NA 136 138 138 138  --  136  K 3.9 4.0 3.7 3.8  --  4.0  CL 106 104 109 108  --  107  CO2 23 26 22 24   --  21*  GLUCOSE 85 98 90 85  --  89  BUN 15 18 17 14   --  13  CREATININE 1.02 1.37* 1.06 1.00 0.92 0.93  CALCIUM 8.2* 8.2* 8.3* 8.0*  --  8.3*  MG 2.3 2.1 2.1  --   --  2.2  PHOS  --   --   --   --   --  3.0    Studies: No results found.  Scheduled Meds:  aspirin EC  81 mg Oral Daily   atorvastatin  40 mg Oral Daily   cephALEXin  500 mg Oral Q6H   Chlorhexidine Gluconate Cloth  6 each Topical Daily   clopidogrel  75 mg Oral Daily   cyanocobalamin  1,000 mcg Oral Daily   donepezil  10 mg Oral QHS   enoxaparin (LOVENOX) injection  40 mg Subcutaneous Q24H   finasteride  5 mg Oral Daily   folic acid  1 mg Oral Daily   gabapentin  300 mg Oral QHS   iron polysaccharides  150 mg Oral Daily   magic mouthwash  10 mL Oral QID   metoprolol tartrate  12.5 mg Oral BID   mirabegron ER  50 mg Oral Daily   pantoprazole  40 mg Oral Daily   Vitamin D (Ergocalciferol)  50,000 Units Oral Q7 days   Continuous Infusions: PRN Meds: acetaminophen, phenol, polyethylene glycol, senna-docusate, traMADol  Time spent: 35 minutes  Author: Gillis Santa. MD [facility] 10/07/2022 1:49 PM  To reach On-call, see care teams to locate the attending and reach out to them via [email or web address]. If 7PM-7AM, please contact night-coverage If you still have difficulty reaching the attending provider, please page the [facility] (Director on Call) for [facility] on amion for assistance.

## 2022-10-07 NOTE — Progress Notes (Signed)
Physical Therapy Treatment Patient Details Name: Adam Rios. MRN: [id number] DOB: 17-Aug-1940 Today's Date: 10/07/2022   History of Present Illness Gene Defrees is an 82 y/o with a medical history significant of recurrent UTIs, cognitive impairment, history of CVA, essential hypertension, renal cell carcinoma, neurogenic bladder, chronic indwelling Foley catheter, some dementia, insomnia, chronic diastolic heart failure and history of right lower extremity cellulitis. He has had a PPM placed and 09/08/22 had a cardiomems implant via RHC to the pulmonary artery for assist in management of HFpEF. MD assessment includes: Severe sepsis and acute UTI secondary to chronic indwelling Foley catheter secondary to Proteus mirabilis infection, acute metabolic encephalopathy, AKI, and normocytic anemia.    PT Comments    Pt was asleep in long sitting upon arrival. He is alert and oriented x 2 but lacks insight of overall situation and current deficits. His bed linens were soaked with urine even with having foley cath in place. MD aware. Pt was unaware bed was wet. Author assisted pt with hygiene care prior to OOB activity. He was able to exit L side of bed with increased time + min assist. HOB was elevated. He stood from elevated surface height with CGA but required min assist from standard height. Pt does have posterior LOB upon standing in which required intervention to prevent fall. Session progressed to ambulation 2 x 40 ft with RW + chair follow for safety. Pt remains far from baseline abilities and will continue to benefit from skilled PT to maximize independence and safety with all ADLs.   Recommendations for follow up therapy are one component of a multi-disciplinary discharge planning process, led by the attending physician.  Recommendations may be updated based on patient status, additional functional criteria and insurance authorization.     Assistance Recommended at Discharge Frequent or constant  Supervision/Assistance  Patient can return home with the following A little help with walking and/or transfers;A lot of help with bathing/dressing/bathroom;Assistance with cooking/housework;Direct supervision/assist for medications management;Direct supervision/assist for financial management;Assist for transportation;Help with stairs or ramp for entrance   Equipment Recommendations  None recommended by PT    Recommendations for Other Services       Precautions / Restrictions Precautions Precautions: Fall Restrictions Weight Bearing Restrictions: No     Mobility  Bed Mobility Overal bed mobility: Needs Assistance Bed Mobility: Supine to Sit  Supine to sit: Min assist  General bed mobility comments: Pt required min assist of one to exit L side of bed with increased time + vcs. HOB was elevated.    Transfers Overall transfer level: Needs assistance Equipment used: Rolling walker (2 wheels) Transfers: Sit to/from Stand Sit to Stand: Min assist  General transfer comment: Min assist to stand from standard height surface. CGA from slightly elevated bed height    Ambulation/Gait Ambulation/Gait assistance: Min guard Gait Distance (Feet): 40 Feet Assistive device: Rolling walker (2 wheels) Gait Pattern/deviations: Trunk flexed, Decreased step length - right, Decreased step length - left Gait velocity: decreased  General Gait Details: Pt required CGA for safety with one occasion of mod assist to prevent LOB. Pt tends to ambulate with severely flexed posture and and short shuffling steps   Balance Overall balance assessment: Needs assistance Sitting-balance support: Feet supported Sitting balance-Leahy Scale: Good     Standing balance support: Bilateral upper extremity supported, During functional activity, Reliant on assistive device for balance Standing balance-Leahy Scale: Poor Standing balance comment: pt did have 1 occasion of posterior LOB in which intervention required to  prevent fall    Cognition Arousal/Alertness: Awake/alert Behavior During Therapy: WFL for tasks assessed/performed Overall Cognitive Status: Within Functional Limits for tasks assessed    General Comments: Pt is A and O x 3 but does lack good insight of current situation and his physical limitations.           General Comments General comments (skin integrity, edema, etc.): Pt lacks insight of current situation. Pt has foley cath but its been leaking throughout admission. pt's bed linens completely soaked with pt unaware.      Pertinent Vitals/Pain Pain Assessment Pain Assessment: No/denies pain     PT Goals (current goals can now be found in the care plan section) Acute Rehab PT Goals Patient Stated Goal: none stated Progress towards PT goals: Progressing toward goals    Frequency    Min 3X/week      PT Plan Current plan remains appropriate       AM-PAC PT "6 Clicks" Mobility   Outcome Measure  Help needed turning from your back to your side while in a flat bed without using bedrails?: A Little Help needed moving from lying on your back to sitting on the side of a flat bed without using bedrails?: A Little Help needed moving to and from a bed to a chair (including a wheelchair)?: A Lot Help needed standing up from a chair using your arms (e.g., wheelchair or bedside chair)?: A Lot Help needed to walk in hospital room?: A Little Help needed climbing 3-5 steps with a railing? : A Lot 6 Click Score: 15    End of Session Equipment Utilized During Treatment: Gait belt Activity Tolerance: Patient tolerated treatment well;Patient limited by fatigue Patient left: in chair;with call bell/phone within reach;with chair alarm set Nurse Communication: Mobility status PT Visit Diagnosis: Difficulty in walking, not elsewhere classified (R26.2);Muscle weakness (generalized) (M62.81)     Time: 1610-9604 PT Time Calculation (min) (ACUTE ONLY): 31 min  Charges:  $Gait  Training: 8-22 mins $Therapeutic Activity: 8-22 mins                     Jetta Lout PTA 10/07/22, 12:18 PM

## 2022-10-07 NOTE — Progress Notes (Signed)
Mobility Specialist - Progress Note   10/07/22 1400  Mobility  Activity Ambulated with assistance in hallway  Level of Assistance Standby assist, set-up cues, supervision of patient - no hands on  Assistive Device Front wheel walker  Distance Ambulated (ft) 180 ft  Activity Response Tolerated well  $Mobility charge 1 Mobility  Mobility Specialist Start Time (ACUTE ONLY) 1248  Mobility Specialist Stop Time (ACUTE ONLY) 1315  Mobility Specialist Time Calculation (min) (ACUTE ONLY) 27 min     Pt sitting in recliner upon arrival, utilizing RA. Pt STS with modA x3. Initial post lean in standing. Ambulated (60' + 47' + 40') with minG. Forward flexed lean. No complaints. Pt left in recliner with alarm on, needs in reach.    Adam Rios Mobility Specialist 10/07/22, 2:08 PM

## 2022-10-07 NOTE — TOC Progression Note (Addendum)
Transition of Care (TOC) - Progression Note    Patient Details  Name: Adam Rios. MRN: [id number] Date of Birth: 1941-05-08  Transition of Care [facility]) CM/SW Contact  Allena Katz, LCSW Phone Number: 10/07/2022, 10:27 AM  Clinical Narrative:     CSW spoke with patient regarding plan of care. CSW informed pt he had used all of his skilled days and would have to go to LTC if wanting placement. Pt reports he is not interested in this and would like to pursue other avenues. CSW spoke with patient about PACE services, a hospital bed, and BSC at home. Pt is interested in all of these. Pt's aide company [employer] reports patients daughter in law who lives out of state wants to assist with paying for additional aide services. Patient is agreeable to this as well. CSW will reach out to daughter in law and confirm. Daughter updated about plan. CSW will reach out to daughter in law and ensure she is able to assist and then arrange for DME to be delivered.    10:31am  CSW LVM with APS social worker Magazine features editor.  11:21am  Pt's catheter has been leaking urine out of it and CSW has requested we formulate a plan to resolve this before pt returns home. Per [employer], Daughter in law can assist with aide services until pt is enrolled in PACE but says she can only contribute 500 a month. Per Cammy Copa with [employer], this would give the patient 3 shifts a week for four hours with the amount the daughter is already contributing. CSW will follow up with APS to see if this is sufficient or if they are planning on obtaining a protective order. Pt seems confused at times and CSW has requested a cognitive evaluation.       Expected Discharge Plan and Services                                               Social Determinants of Health (SDOH) Interventions SDOH Screenings   Food Insecurity: No Food Insecurity (09/29/2022)  Housing: Low Risk  (09/29/2022)  Transportation Needs: No  Transportation Needs (09/29/2022)  Utilities: Not At Risk (09/29/2022)  Tobacco Use: Medium Risk (09/29/2022)    Readmission Risk Interventions    08/11/2022    4:53 PM 06/11/2022    2:19 PM 02/12/2022    2:17 PM  Readmission Risk Prevention Plan  Transportation Screening Complete Complete Complete  PCP or Specialist Appt within 3-5 Days  Complete Complete  HRI or Home Care Consult  Complete Complete  Social Work Consult for Recovery Care Planning/Counseling  Complete Complete  Palliative Care Screening  Not Applicable Not Applicable  Medication Review Oceanographer) Complete Complete Referral to Pharmacy  [facility] or Home Care Consult Complete    Skilled Nursing Facility Complete

## 2022-10-07 NOTE — Progress Notes (Signed)
Occupational Therapy Treatment Patient Details Name: Adam Rios. MRN: [id number] DOB: 1940-12-13 Today's Date: 10/07/2022   History of present illness Adam Rios is an 82 y/o with a medical history significant of recurrent UTIs, cognitive impairment, history of CVA, essential hypertension, renal cell carcinoma, neurogenic bladder, chronic indwelling Foley catheter, some dementia, insomnia, chronic diastolic heart failure and history of right lower extremity cellulitis. He has had a PPM placed and 09/08/22 had a cardiomems implant via RHC to the pulmonary artery for assist in management of HFpEF. MD assessment includes: Severe sepsis and acute UTI secondary to chronic indwelling Foley catheter secondary to Proteus mirabilis infection, acute metabolic encephalopathy, AKI, and normocytic anemia.   OT comments  Upon entering the room, pt seated in recliner chair and agreeable to OT intervention. Pt reports being agreeable to working on grooming tasks in standing at sink. Pt stands from recliner chair with mod lifting assistance with use of RW. Pt ambulates with min A and use of RW 15' to sink. Pt stands to comb hair, wash face, and brush teeth with min guard for standing balance. Pt endorses B LEs feeling fatigued and returning back to recliner chair in same manner as above. Pt is tearful talking about home situation and wanting to return home but needing he needs additional services and assistance to do so.   Recommendations for follow up therapy are one component of a multi-disciplinary discharge planning process, led by the attending physician.  Recommendations may be updated based on patient status, additional functional criteria and insurance authorization.    Assistance Recommended at Discharge Frequent or constant Supervision/Assistance  Patient can return home with the following  A little help with bathing/dressing/bathroom;A little help with walking and/or transfers;Assistance with  cooking/housework;Direct supervision/assist for medications management;Direct supervision/assist for financial management;Assist for transportation;Help with stairs or ramp for entrance   Equipment Recommendations  Other (comment) (defer to next venue of care)       Precautions / Restrictions Precautions Precautions: Fall Restrictions Weight Bearing Restrictions: No       Mobility Bed Mobility               General bed mobility comments: seated in recliner chair    Transfers Overall transfer level: Needs assistance Equipment used: Rolling walker (2 wheels) Transfers: Sit to/from Stand Sit to Stand: Min assist                 Balance Overall balance assessment: Needs assistance Sitting-balance support: Feet supported Sitting balance-Leahy Scale: Good     Standing balance support: Bilateral upper extremity supported, During functional activity, Reliant on assistive device for balance Standing balance-Leahy Scale: Poor                             ADL either performed or assessed with clinical judgement   ADL Overall ADL's : Needs assistance/impaired     Grooming: Wash/dry face;Brushing hair;Oral care;Standing;Min guard                                      Extremity/Trunk Assessment Upper Extremity Assessment Upper Extremity Assessment: Generalized weakness   Lower Extremity Assessment Lower Extremity Assessment: Generalized weakness        Vision Patient Visual Report: No change from baseline            Cognition Arousal/Alertness: Awake/alert Behavior During Therapy: [facility] for  tasks assessed/performed Overall Cognitive Status: Within Functional Limits for tasks assessed                                 General Comments: Pt is A and O x 3 but does lack good insight of current situation and his physical limitations.              General Comments Pt lacks insight of current situation. Pt has foley cath  but its been leaking throughout admission. pt's bed linens completely soaked with pt unaware.    Pertinent Vitals/ Pain       Pain Assessment Pain Assessment: No/denies pain         Frequency  Min 2X/week        Progress Toward Goals  OT Goals(current goals can now be found in the care plan section)  Progress towards OT goals: Progressing toward goals     Plan Discharge plan remains appropriate;Frequency remains appropriate       AM-PAC OT "6 Clicks" Daily Activity     Outcome Measure   Help from another person eating meals?: None Help from another person taking care of personal grooming?: A Little Help from another person toileting, which includes using toliet, bedpan, or urinal?: A Little Help from another person bathing (including washing, rinsing, drying)?: A Lot Help from another person to put on and taking off regular upper body clothing?: A Little Help from another person to put on and taking off regular lower body clothing?: A Lot 6 Click Score: 17    End of Session Equipment Utilized During Treatment: Gait belt;Rolling walker (2 wheels)  OT Visit Diagnosis: Unsteadiness on feet (R26.81)   Activity Tolerance Patient tolerated treatment well;Patient limited by fatigue   Patient Left in bed;with call bell/phone within reach;with bed alarm set   Nurse Communication Mobility status        Time: 3086-5784 OT Time Calculation (min): 20 min  Charges: OT General Charges $OT Visit: 1 Visit OT Treatments $Self Care/Home Management : 8-22 mins  Jackquline Denmark, MS, OTR/L , CBIS ascom [phone]  10/07/22, 3:03 PM

## 2022-10-07 NOTE — Consult Note (Signed)
[facility] Face-to-Face Psychiatry Consult   Reason for Consult: Consult for a 82-year-old man currently in the hospital recovering from sepsis.  Question about "capacity" Referring Physician:  Lucianne Muss Patient Identification: Adam Rios. MRN:  [id number] Principal Diagnosis: Sepsis secondary to UTI [facility]) Diagnosis:  Principal Problem:   Sepsis secondary to UTI [facility]) Active Problems:   Essential hypertension   GERD (gastroesophageal reflux disease)   Neurogenic bladder   Complicated UTI (urinary tract infection)   BPH with obstruction/lower urinary tract symptoms   Chronic diastolic CHF (congestive heart failure) (HCC)   AKI (acute kidney injury) (HCC)   Atrial fibrillation with RVR (HCC)   PAD (peripheral artery disease) (HCC)   History of stroke   Acute metabolic encephalopathy   Total Time spent with patient: 30 minutes  Subjective:   Adam Rios. is a 82 y.o. male patient admitted with "I had a UTI".  HPI: Patient seen and chart reviewed.  82 year old man with a history of multiple medical problems in the hospital with altered mental status and confusion initially related to UTI now recovering.  Apparently there has been some discussion about his needs at discharge.  Patient tells me he is looking forward to discharge and that when he does he will be going back home to live at the same home where he had been prior to admission.  He tells me that he lives with his daughter as well as his daughter's husband and her children.  He tells me that before coming into the hospital his daughter assisted him with managing his medication.  Also daughter often assisted with food preparation and otherwise he got meals delivered at lunchtime.  Patient is able to articulate that he will need assistance for his basic living routine after discharge.  He says he needs some help taking a shower and needs help with meal preparation.  He says he no longer drives and needs to have someone take him to  appointments.  Acknowledges that he needs assistance with medication management.  Patient tells me that he spoke with social worker earlier today and that they presented an option of the [facility] program.  He was not entirely sure what that was but was under the impression that it was a system or program by which he could get some in-home help.  Patient acknowledges that the idea of an assisted living facility was discussed but that he did not want to do that.  Otherwise patient has no mental health complaints.  Denies depression denies psychotic symptoms.  Patient states he has no history of any mental health problems in the past or mental health treatment.  He is alert and oriented x 4 and is able to remember 2 out of 3 objects at 3 minutes.  His conversation is articulate and he stays on topic.  Past Psychiatric History: No past psychiatric history of any sort identified  Risk to Self:   Risk to Others:   Prior Inpatient Therapy:   Prior Outpatient Therapy:    Past Medical History:  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HCC)    Hypertension    Neurogenic bladder    Renal cell carcinoma (HCC) 2005   Renal disorder    Stroke (HCC)     01/17/18, 12/21   Some weakness and cognitive decline    Past Surgical History:  Procedure Laterality Date  APPENDECTOMY     CATARACT EXTRACTION W/PHACO Right 07/22/2020   Procedure: CATARACT EXTRACTION PHACO AND INTRAOCULAR LENS PLACEMENT (IOC) RIGHT;  Surgeon: Nevada Crane, MD;  Location: [facility];  Service: Ophthalmology;  Laterality: Right;  4.77 0:44.2   CATARACT EXTRACTION W/PHACO Left 08/05/2020   Procedure: CATARACT EXTRACTION PHACO AND INTRAOCULAR LENS PLACEMENT (IOC) LEFT 10.73 01:13.8;  Surgeon: Nevada Crane, MD;  Location: [facility];  Service: Ophthalmology;  Laterality: Left;  Use eye stretcher  not chair   HERNIA REPAIR     LUMBAR FUSION     NEPHRECTOMY Left 2005   TEMPORARY PACEMAKER N/A 05/03/2022   Procedure: TEMPORARY PACEMAKER;  Surgeon: Alwyn Pea, MD;  Location: [facility] INVASIVE CV LAB;  Service: Cardiovascular;  Laterality: N/A;   Family History:  Family History  Problem Relation Age of Onset   Cerebral aneurysm Father    Cerebral aneurysm Sister    Family Psychiatric  History: See previous Social History:  Social History   Substance and Sexual Activity  Alcohol Use Not Currently     Social History   Substance and Sexual Activity  Drug Use Never    Social History   Socioeconomic History   Marital status: Divorced    Spouse name: Not on file   Number of children: Not on file   Years of education: Not on file   Highest education level: Not on file  Occupational History   Not on file  Tobacco Use   Smoking status: Former    Packs/day: 0.25    Years: 10.00    Additional pack years: 0.00    Total pack years: 2.50    Types: Cigarettes    Quit date: 2005    Years since quitting: 19.3   Smokeless tobacco: Never  Vaping Use   Vaping Use: Never used  Substance and Sexual Activity   Alcohol use: Not Currently   Drug use: Never   Sexual activity: Not Currently  Other Topics Concern   Not on file  Social History Narrative   Not on file   Social Determinants of Health   Financial Resource Strain: Not on file  Food Insecurity: No Food Insecurity (09/29/2022)   Hunger Vital Sign    Worried About Running Out of Food in the Last Year: Never true    Ran Out of Food in the Last Year: Never true  Transportation Needs: No Transportation Needs (09/29/2022)   PRAPARE - Administrator, Civil Service (Medical): No    Lack of Transportation (Non-Medical): No  Physical Activity: Not on file  Stress: Not on file  Social Connections: Not on file   Additional Social History:    Allergies:   Allergies  Allergen Reactions   Empagliflozin      Recurrent UTI with sepsis on jardiance.   Fentanyl Nausea Only   Oxycodone Nausea And Vomiting    Labs:  Results for orders placed or performed during the hospital encounter of 09/28/22 (from the past 48 hour(s))  Basic metabolic panel     Status: Abnormal   Collection Time: 10/06/22  4:15 AM  Result Value Ref Range   Sodium 136 135 - 145 mmol/L   Potassium 4.0 3.5 - 5.1 mmol/L   Chloride 107 98 - 111 mmol/L   CO2 21 (L) 22 - 32 mmol/L   Glucose, Bld 89 70 - 99 mg/dL    Comment: Glucose reference range applies only to samples taken after fasting for at least 8  hours.   BUN 13 8 - 23 mg/dL   Creatinine, Ser 1.61 0.61 - 1.24 mg/dL   Calcium 8.3 (L) 8.9 - 10.3 mg/dL   GFR, Estimated >09 >60 mL/min    Comment: (NOTE) Calculated using the CKD-EPI Creatinine Equation (2021)    Anion gap 8 5 - 15    Comment: Performed at [facility], [address] [place], [place] [address]  CBC     Status: Abnormal   Collection Time: 10/06/22  4:15 AM  Result Value Ref Range   WBC 6.9 4.0 - 10.5 K/uL   RBC 4.09 (L) 4.22 - 5.81 MIL/uL   Hemoglobin 10.9 (L) 13.0 - 17.0 g/dL   HCT 81.1 (L) 91.4 - 78.2 %   MCV 84.4 80.0 - 100.0 fL   MCH 26.7 26.0 - 34.0 pg   MCHC 31.6 30.0 - 36.0 g/dL   RDW 95.6 (H) 21.3 - 08.6 %   Platelets 199 150 - 400 K/uL   nRBC 0.0 0.0 - 0.2 %    Comment: Performed at [facility], [address], [place], [place] [address]  Phosphorus     Status: None   Collection Time: 10/06/22  4:15 AM  Result Value Ref Range   Phosphorus 3.0 2.5 - 4.6 mg/dL    Comment: Performed at [facility], [address], [place], [place] [address]  Magnesium     Status: None   Collection Time: 10/06/22  4:15 AM  Result Value Ref Range   Magnesium 2.2 1.7 - 2.4 mg/dL    Comment: Performed at [facility], [address], [place], [place] [address]    Current Facility-Administered Medications  Medication Dose Route Frequency Provider Last Rate Last  Admin   acetaminophen (TYLENOL) tablet 500 mg  500 mg Oral Q6H PRN Jacques Navy, MD   500 mg at 10/02/22 2440   aspirin EC tablet 81 mg  81 mg Oral Daily Norins, Rosalyn Gess, MD   81 mg at 10/07/22 1217   atorvastatin (LIPITOR) tablet 40 mg  40 mg Oral Daily Norins, Rosalyn Gess, MD   40 mg at 10/07/22 1218   cephALEXin (KEFLEX) capsule 500 mg  500 mg Oral Q6H Gillis Santa, MD   500 mg at 10/07/22 1225   Chlorhexidine Gluconate Cloth 2 % PADS 6 each  6 each Topical Daily Lurene Shadow, MD   6 each at 10/07/22 1358   clopidogrel (PLAVIX) tablet 75 mg  75 mg Oral Daily Norins, Rosalyn Gess, MD   75 mg at 10/07/22 1223   cyanocobalamin (VITAMIN B12) tablet 1,000 mcg  1,000 mcg Oral Daily Jacques Navy, MD   1,000 mcg at 10/07/22 1217   donepezil (ARICEPT) tablet 10 mg  10 mg Oral QHS Norins, Rosalyn Gess, MD   10 mg at 10/06/22 2222   enoxaparin (LOVENOX) injection 40 mg  40 mg Subcutaneous Q24H Norins, Rosalyn Gess, MD   40 mg at 10/07/22 1215   finasteride (PROSCAR) tablet 5 mg  5 mg Oral Daily Norins, Rosalyn Gess, MD   5 mg at 10/07/22 1219   folic acid (FOLVITE) tablet 1 mg  1 mg Oral Daily Gillis Santa, MD   1 mg at 10/07/22 1217   gabapentin (NEURONTIN) capsule 300 mg  300 mg Oral QHS Norins, Rosalyn Gess, MD   300 mg at 10/06/22 2223   iron polysaccharides (NIFEREX) capsule 150 mg  150 mg Oral Daily Gillis Santa, MD   150 mg at 10/07/22  1216   magic mouthwash  10 mL Oral QID Rolly Salter, MD   10 mL at 10/07/22 1521   metoprolol tartrate (LOPRESSOR) tablet 12.5 mg  12.5 mg Oral BID Gillis Santa, MD   12.5 mg at 10/07/22 1220   mirabegron ER (MYRBETRIQ) tablet 50 mg  50 mg Oral Daily Norins, Rosalyn Gess, MD   50 mg at 10/07/22 1216   pantoprazole (PROTONIX) EC tablet 40 mg  40 mg Oral Daily Norins, Rosalyn Gess, MD   40 mg at 10/07/22 1223   phenol (CHLORASEPTIC) mouth spray 1 spray  1 spray Mouth/Throat PRN Lurene Shadow, MD       polyethylene glycol (MIRALAX / GLYCOLAX) packet 17 g  17 g Oral Daily  PRN Norins, Rosalyn Gess, MD       senna-docusate (Senokot-S) tablet 1 tablet  1 tablet Oral BID PRN Jaynie Bream, RPH   1 tablet at 10/06/22 1022   traMADol (ULTRAM) tablet 50 mg  50 mg Oral Q8H PRN Norins, Rosalyn Gess, MD   50 mg at 10/04/22 2125   Vitamin D (Ergocalciferol) (DRISDOL) 1.25 MG (50000 UNIT) capsule 50,000 Units  50,000 Units Oral Q7 days Gillis Santa, MD   50,000 Units at 10/06/22 1228    Musculoskeletal: Strength & Muscle Tone: decreased Gait & Station: unsteady Patient leans: N/A            Psychiatric Specialty Exam:  Presentation  General Appearance: No data recorded Eye Contact:No data recorded Speech:No data recorded Speech Volume:No data recorded Handedness:No data recorded  Mood and Affect  Mood:No data recorded Affect:No data recorded  Thought Process  Thought Processes:No data recorded Descriptions of Associations:No data recorded Orientation:No data recorded Thought Content:No data recorded History of Schizophrenia/Schizoaffective disorder:No data recorded Duration of Psychotic Symptoms:No data recorded Hallucinations:No data recorded Ideas of Reference:No data recorded Suicidal Thoughts:No data recorded Homicidal Thoughts:No data recorded  Sensorium  Memory:No data recorded Judgment:No data recorded Insight:No data recorded  Executive Functions  Concentration:No data recorded Attention Span:No data recorded Recall:No data recorded Fund of Knowledge:No data recorded Language:No data recorded  Psychomotor Activity  Psychomotor Activity:No data recorded  Assets  Assets:No data recorded  Sleep  Sleep:No data recorded  Physical Exam: Physical Exam Vitals reviewed.  Constitutional:      Appearance: Normal appearance.  HENT:     Head: Normocephalic and atraumatic.     Mouth/Throat:     Pharynx: Oropharynx is clear.  Eyes:     Pupils: Pupils are equal, round, and reactive to light.  Cardiovascular:     Rate and  Rhythm: Normal rate and regular rhythm.  Pulmonary:     Effort: Pulmonary effort is normal.     Breath sounds: Normal breath sounds.  Abdominal:     General: Abdomen is flat.     Palpations: Abdomen is soft.  Musculoskeletal:        General: Normal range of motion.  Skin:    General: Skin is warm and dry.  Neurological:     General: No focal deficit present.     Mental Status: He is alert. Mental status is at baseline.  Psychiatric:        Attention and Perception: Attention normal.        Mood and Affect: Mood normal. Affect is blunt.        Speech: Speech normal.        Behavior: Behavior normal.        Thought Content: Thought content normal.  Cognition and Memory: Cognition normal.        Judgment: Judgment normal.    Review of Systems  Constitutional: Negative.   HENT: Negative.    Eyes: Negative.   Respiratory: Negative.    Cardiovascular: Negative.   Gastrointestinal: Negative.   Musculoskeletal: Negative.   Skin: Negative.   Neurological: Negative.   Psychiatric/Behavioral: Negative.     Blood pressure 122/61, pulse 62, temperature 98.5 F (36.9 C), resp. rate 17, height 6\' 1"  (1.854 m), weight 94.8 kg, SpO2 98 %. Body mass index is 27.57 kg/m.  Treatment Plan Summary: Plan patient with no past psychiatric history and no current psychiatric complaints.  He is alert and oriented x 4.  He is able to engage in appropriate conversation about his needs after the hospital.  There is nothing about this patient that suggest to me that he lacks any fundamental capacity.  Typically capacity evaluations require some very specific decision be at hand in order to assess a person's capacity to make that decision.  In this case I am not clear what the specific decision would be but in general he seems to have general capacity such that I would recommend continuing to respect his wishes.  If this is unclear or there is any other specific question please reconsult [place] and let us  know.  Disposition: No evidence of imminent risk to self or others at present.    Mordecai Rasmussen, MD 10/07/2022 3:40 PM

## 2022-10-08 DIAGNOSIS — A419 Sepsis, unspecified organism: Secondary | ICD-10-CM | POA: Diagnosis not present

## 2022-10-08 DIAGNOSIS — N390 Urinary tract infection, site not specified: Secondary | ICD-10-CM | POA: Diagnosis not present

## 2022-10-08 NOTE — Plan of Care (Signed)

## 2022-10-08 NOTE — TOC Progression Note (Signed)
Transition of Care (TOC) - Progression Note    Patient Details  Name: Ryshawn Wiegand. MRN: [id number] Date of Birth: June 09, 1940  Transition of Care [facility]) CM/SW Contact  Allena Katz, LCSW Phone Number: 10/08/2022, 3:37 PM  Clinical Narrative:   CSW spoke with Maralyn Sago with [facility] who states they are able to accept referral for PT/OT/RN/AID/SW and see the patient within 48 hours of discharge.         Expected Discharge Plan and Services                                               Social Determinants of Health (SDOH) Interventions SDOH Screenings   Food Insecurity: No Food Insecurity (09/29/2022)  Housing: Low Risk  (09/29/2022)  Transportation Needs: No Transportation Needs (09/29/2022)  Utilities: Not At Risk (09/29/2022)  Tobacco Use: Medium Risk (09/29/2022)    Readmission Risk Interventions    08/11/2022    4:53 PM 06/11/2022    2:19 PM 02/12/2022    2:17 PM  Readmission Risk Prevention Plan  Transportation Screening Complete Complete Complete  PCP or Specialist Appt within 3-5 Days  Complete Complete  HRI or Home Care Consult  Complete Complete  Social Work Consult for Recovery Care Planning/Counseling  Complete Complete  Palliative Care Screening  Not Applicable Not Applicable  Medication Review [employer]) Complete Complete Referral to Pharmacy  [facility] or Home Care Consult Complete    Skilled Nursing Facility Complete

## 2022-10-08 NOTE — Progress Notes (Signed)
Mobility Specialist - Progress Note   10/08/22 1200  Mobility  Activity Ambulated with assistance in hallway  Level of Assistance Standby assist, set-up cues, supervision of patient - no hands on  Assistive Device Front wheel walker  Distance Ambulated (ft) 180 ft  Activity Response Tolerated well  Mobility Referral Yes  $Mobility charge 1 Mobility  Mobility Specialist Start Time (ACUTE ONLY) 1208  Mobility Specialist Stop Time (ACUTE ONLY) 1232  Mobility Specialist Time Calculation (min) (ACUTE ONLY) 24 min     Pt sitting in recliner upon arrival, utilizing RA. Pt STS from chair with heavy modA and foot blocking on LLE to prevent sliding. Noted pt with increased front flexed posture during ambulation this date. No LOB. Pt ambulated hallway (90' + 90') with minG. No complaints. Pt left in chair with alarm set, needs in reach.    Filiberto Pinks Mobility Specialist 10/08/22, 12:35 PM

## 2022-10-08 NOTE — Progress Notes (Signed)
[facility] Progress Note  Patient: Adam Rios.    ZOX:[id number]  DOA: 09/28/2022     Date of Service: the patient was seen and examined on 10/08/2022  Chief Complaint  Patient presents with   Altered Mental Status   Brief hospital course: PMH of HTN, CVA, anxiety RCC, neurogenic bladder with chronic indwelling Foley catheter presented to hospital with complaints of unresponsive episode at home. Reportedly patient was giving blank stare. Currently being treated for UTI.  On 10/03/2022 the patient is more responsive, but still confused. Placement is pending. Home health has refused to see him as when they were caring for him before, they stopped when it appeared that the patient was unable to make decisions for himself. Dr. Allena Katz has determined that the patient is decisional. Awaiting placement.   Assessment and Plan:  Severe sepsis acute UTI secondary to chronic indwelling Foley catheter secondary to Proteus mirabilis infection. With tachycardia, tachypnea as well as acute kidney injury meeting criteria for severe sepsis at the time of admission with source of infection is UTI. S/p IV ceftriaxone x 2 doses  Foley catheter changed on 4/30. Blood cultures so far negative.  Urine culture growing Proteus.   On 5/2--5/8 oral Keflex based on sensitivity.  Completed course Sepsis physiology resolved. Monitor.   Acute metabolic encephalopathy, Resolved  Unresponsive episode, Original reason for patient's presentation. Mentation improving.  No focal deficit.  Able to follow all commands.  Likely combination of AKI as well as sepsis currently dehydration. Patient does appear to have capacity to make medical decisions as of evaluation on 5/3.  Patient does not appear to be making right decisions as he desires to go home with his severe deconditioning. 5/8 consulted psychiatrist to eval patient for decision-making capacity and as per psych patient does have decision-making capacity, and we  should respect his wishes.   AKI. Hypokalemia Resolved. K is 4.0  Creatinine is at baseline at 1.00 Monitor creatinine, electrolytes, and volume status.   Concern for A-fib with RVR-ruled out. Frequent PVC unknown NSVT Reported to have A-fib at the time of admission. On my evaluation on the EKG there are P waves seen in the lead II. Currently no evidence of A-fib for now. Most likely sinus tachycardia in the setting of sepsis at the time of admission. Continue Toprol-XL. Echocardiogram performed shows preserved EF at 55-60% with possible infiltrative process such as amyloid/sarcoid. There is moderate concentric LVH. There are no LV regional wall motion abnormalities. Normal diastolic parameters. Patient has established discharge with [facility] cardiology and recommend to follow-up with them outpatient.   Chronic diastolic CHF. On torsemide at home.  EF 55 to 60%. Currently euvolemic.  Has frequent visits to [facility] urgent care clinic for IV diuresis. Torsemide was resumed but renal function worsened therefore on hold again.   Normocytic anemia. Hemoglobin 10.9. On admission 13.3. Likely this is dilutional drop, no bleeding. Monitor.   Folic acid level 6.8, at lower end, started folic acid 1 mg p.o. daily.  Repeat folic acid level after 3 months  Vitamin D deficiency: started vitamin D 50,000 units p.o. weekly, follow with PCP to repeat vitamin D level after 3 to 6 months.  Dementia. On Aricept.  No behavioral issues.   Chronic neuropathy. Gabapentin nightly.   Will continue for now.   HLD. Continuing statin.   History of CVA. On aspirin and Plavix. Which I will continue.   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 Patient appears to have history of chronic intermittent catheterization for 10+ years. After his HoLEP in January 2024 he was unable to perform intermittent self-catheterization and had constant urinary  leakage. In February 2024 patient's primary urologist approved for placement of a chronic indwelling Foley catheter for incontinence. Patient appears to have recurrent UTIs since then including this admission. Discussed with urology, recommend to continue current management but recommend a voiding trial with urology outpatient since he has a Foley catheter for so long. Foley catheter already upped in size from 57 [place] to 16 [place] and continues to have leakage. Unable to perform urodynamic studies in the hospital. Outpatient follow-up with primary urologist recommended with Dr. Al Corpus. Continue Myrbetriq. 5/7 d/w urologist Dr Lonna Cobb, rec f/u with his primary urologist as an outpatient, no intervention. 5/8 discussed with another urologist Dr. Apolinar Junes who suggested to do bladder scan to rule out urinary retention and kinking of Foley, continue Myrbetriq, no any other intervention.    Social issues. It appears that the patient case is currently being evaluated by APS. There have been concerns that he is unable to make decisions for  himself. By Dr. Eliane Decree evaluation on 10/02/2022, he seemed to be decisional. In any case, he is not able to go home with home health as home health cannot follow him there, because they have determined that he cannot make his own decisions. TOC is working on discharge planning 5/6 DSS is coming to evaluate him today and asked not to release the patient as per TOC.  Body mass index is 28.36 kg/m.  Interventions:       Diet: Heart healthy diet DVT Prophylaxis: Subcutaneous Lovenox   Advance goals of care discussion: Full code  Family Communication: family was not present at bedside, at the time of interview.  The pt provided permission to discuss medical plan with the family. Opportunity was given to ask question and all questions were answered satisfactorily.   Disposition:  Pt is from Home, admitted with unresponsiveness, sepsis, UTI, AKI, chronic Foley  catheter, descending, awaiting for placement, TOC consulted for discharge planning, Discharge to Home with HH.  Stable to discharge, awaiting for all documents to be delivered as per [facility], most likely discharge on Saturday, 10/10/2022.   Subjective: No significant events overnight, patient was sitting comfortably in the recliner, still urine is leaking, denies any other complaints. Discussed with patient that got an opinion from 2 different urologist and both did not offer any solution.  So patient needs to wear briefs and follow with his own urologist.  Physical Exam: General: NAD, lying comfortably Appear in no distress, affect appropriate Eyes: PERRLA ENT: Oral Mucosa Clear, moist  Neck: no JVD,  Cardiovascular: S1 and S2 Present, no Murmur,  Respiratory: good respiratory effort, Bilateral Air entry equal and Decreased, no Crackles, no wheezes Abdomen: Bowel Sound present, Soft and no tenderness,  Skin: no rashes Extremities: no Pedal edema, no calf tenderness Neurologic: without any new focal findings Gait not checked due to patient safety concerns  Vitals:   10/07/22 2334 10/08/22 0439 10/08/22 0830 10/08/22 1140  BP: 121/68 112/79 126/70 110/70  Pulse: 64 64 64 64  Resp: 16 19 16 16   Temp: 98 F (36.7 C) 98.7 F (37.1 C) 98.6 F (37 C) 98.5 F (36.9 C)  TempSrc: Oral     SpO2: 99% 96% 96% 98%  Weight:      Height:        Intake/Output Summary (Last 24 hours) at  10/08/2022 1157 Last data filed at 10/07/2022 1853 Gross per 24 hour  Intake --  Output 450 ml  Net -450 ml    Filed Weights   10/04/22 0500 10/06/22 0500 10/07/22 0230  Weight: 97.5 kg 97 kg 94.8 kg    Data Reviewed: I have personally reviewed and interpreted daily labs, tele strips, imagings as discussed above. I reviewed all nursing notes, pharmacy notes, vitals, pertinent old records I have discussed plan of care as described above with RN and patient/family.  CBC: Recent Labs  Lab 10/02/22 0520  10/03/22 0434 10/04/22 0521 10/06/22 0415  WBC 6.1 6.1 6.8 6.9  NEUTROABS  --   --  4.0  --   HGB 11.3* 10.9* 11.0* 10.9*  HCT 35.7* 34.4* 35.2* 34.5*  MCV 84.8 84.3 85.2 84.4  PLT 156 168 180 199   Basic Metabolic Panel: Recent Labs  Lab 10/02/22 0520 10/03/22 0434 10/04/22 0521 10/05/22 0542 10/06/22 0415  NA 138 138 138  --  136  K 4.0 3.7 3.8  --  4.0  CL 104 109 108  --  107  CO2 26 22 24   --  21*  GLUCOSE 98 90 85  --  89  BUN 18 17 14   --  13  CREATININE 1.37* 1.06 1.00 0.92 0.93  CALCIUM 8.2* 8.3* 8.0*  --  8.3*  MG 2.1 2.1  --   --  2.2  PHOS  --   --   --   --  3.0    Studies: No results found.  Scheduled Meds:  aspirin EC  81 mg Oral Daily   atorvastatin  40 mg Oral Daily   Chlorhexidine Gluconate Cloth  6 each Topical Daily   clopidogrel  75 mg Oral Daily   cyanocobalamin  1,000 mcg Oral Daily   donepezil  10 mg Oral QHS   enoxaparin (LOVENOX) injection  40 mg Subcutaneous Q24H   finasteride  5 mg Oral Daily   folic acid  1 mg Oral Daily   gabapentin  300 mg Oral QHS   iron polysaccharides  150 mg Oral Daily   magic mouthwash  10 mL Oral QID   metoprolol tartrate  12.5 mg Oral BID   mirabegron ER  50 mg Oral Daily   pantoprazole  40 mg Oral Daily   Vitamin D (Ergocalciferol)  50,000 Units Oral Q7 days   Continuous Infusions: PRN Meds: acetaminophen, phenol, polyethylene glycol, senna-docusate, traMADol  Time spent: 35 minutes  Author: Gillis Santa. MD [facility] 10/08/2022 11:57 AM  To reach On-call, see care teams to locate the attending and reach out to them via [email or web address]. If 7PM-7AM, please contact night-coverage If you still have difficulty reaching the attending provider, please page the [facility] (Director on Call) for [facility] on amion for assistance.

## 2022-10-08 NOTE — Progress Notes (Signed)
Pt requires a hospital bed due to his Congestive heart failure that requires repositioning that cannot be achieved in a standard bed.

## 2022-10-08 NOTE — Progress Notes (Addendum)
Patient is not able to walk the distance required to go the bathroom, or he/she is unable to safely negotiate stairs required to access the bathroom.  A 3in1 BSC will alleviate this problem  

## 2022-10-08 NOTE — TOC Progression Note (Addendum)
Transition of Care (TOC) - Progression Note    Patient Details  Name: Adam Rios. MRN: [id number] Date of Birth: Oct 26, 1940  Transition of Care [facility]) CM/SW Contact  Allena Katz, LCSW Phone Number: 10/08/2022, 10:59 AM  Clinical Narrative:   CSW spoke with patient to inform him of care plan at home. Per Verlon Au with [facility], pt will have an aide for four hours, MWF and someone in the family will stay with patient on Thursday. Per Verlon Au, Daughter in law has already pre paid for services for him at home they are just waiting to get the payment from the daughter for her half. Frances Furbish was in with patient prior to admission. Pt reports he is agreeable to this plan and wants to do whatever he needs to stay out of SNF/LTC. He reports he has went too many times and is "done with it". Pt agreeable to [facility], Hospital bed at home. CSW ordered this through [facility]. Pt also very interested in [facility] services. Message left for Nat Math at [facility] to follow back up with patient. CSW will follow up with patients daughter to inform her of plan. CSW also left a message with Joy with APS to update her on plan. CSW waiting to hear if they are able to accept back.    12:00pm  CSW spoke with Joy with DSS about discharge plan and catheter. Joy reports that if urology is stating nothing more can be done pt is good to discharge with the plan we have in place and they will not need to get involved as patient has capacity per psych eval.     12:45pm CSW LVM with daughter  1:00pm  CSW spoke with [facility]ACE who reported that they spoke with the patient and will see the patient at home to do an assessment.   Expected Discharge Plan and Services                                               Social Determinants of Health (SDOH) Interventions SDOH Screenings   Food Insecurity: No Food Insecurity (09/29/2022)  Housing: Low Risk  (09/29/2022)  Transportation Needs: No Transportation Needs  (09/29/2022)  Utilities: Not At Risk (09/29/2022)  Tobacco Use: Medium Risk (09/29/2022)    Readmission Risk Interventions    08/11/2022    4:53 PM 06/11/2022    2:19 PM 02/12/2022    2:17 PM  Readmission Risk Prevention Plan  Transportation Screening Complete Complete Complete  PCP or Specialist Appt within 3-5 Days  Complete Complete  HRI or Home Care Consult  Complete Complete  Social Work Consult for Recovery Care Planning/Counseling  Complete Complete  Palliative Care Screening  Not Applicable Not Applicable  Medication Review Oceanographer) Complete Complete Referral to Pharmacy  [facility] or Home Care Consult Complete    Skilled Nursing Facility Complete

## 2022-10-08 NOTE — Plan of Care (Signed)

## 2022-10-09 LAB — BASIC METABOLIC PANEL
Anion gap: 8 (ref 5–15)
BUN: 13 mg/dL (ref 8–23)
CO2: 22 mmol/L (ref 22–32)
Calcium: 8.2 mg/dL — ABNORMAL LOW (ref 8.9–10.3)
Chloride: 107 mmol/L (ref 98–111)
Creatinine, Ser: 0.97 mg/dL (ref 0.61–1.24)
GFR, Estimated: >60 mL/min (ref >60)
Glucose, Bld: 86 mg/dL (ref 70–99)
Potassium: 4 mmol/L (ref 3.5–5.1)
Sodium: 137 mmol/L (ref 135–145)

## 2022-10-09 LAB — PHOSPHORUS: Phosphorus: 2.8 mg/dL (ref 2.5–4.6)

## 2022-10-09 LAB — CBC
HCT: 34.5 % — ABNORMAL LOW (ref 39.0–52.0)
Hemoglobin: 10.7 g/dL — ABNORMAL LOW (ref 13.0–17.0)
MCH: 26.4 pg (ref 26.0–34.0)
MCHC: 31 g/dL (ref 30.0–36.0)
MCV: 85.2 fL (ref 80.0–100.0)
Platelets: 223 10*3/uL (ref 150–400)
RBC: 4.05 MIL/uL — ABNORMAL LOW (ref 4.22–5.81)
RDW: 15.9 % — ABNORMAL HIGH (ref 11.5–15.5)
WBC: 7.5 10*3/uL (ref 4.0–10.5)
nRBC: 0 % (ref 0.0–0.2)

## 2022-10-09 LAB — MAGNESIUM: Magnesium: 2.2 mg/dL (ref 1.7–2.4)

## 2022-10-09 NOTE — Progress Notes (Signed)
Physical Therapy Treatment Patient Details Name: Adam Rios. MRN: [id number] DOB: May 20, 1941 Today's Date: 10/09/2022   History of Present Illness Adam Rios is an 82 y/o with a medical history significant of recurrent UTIs, cognitive impairment, history of CVA, essential hypertension, renal cell carcinoma, neurogenic bladder, chronic indwelling Foley catheter, some dementia, insomnia, chronic diastolic heart failure and history of right lower extremity cellulitis. He has had a PPM placed and 09/08/22 had a cardiomems implant via RHC to the pulmonary artery for assist in management of HFpEF. MD assessment includes: Severe sepsis and acute UTI secondary to chronic indwelling Foley catheter secondary to Proteus mirabilis infection, acute metabolic encephalopathy, AKI, and normocytic anemia.    PT Comments    Pt resting in recliner upon PT arrival; agreeable to therapy.  During session pt mod assist with transfers using RW and CGA to ambulate 110 feet and then (after sitting rest break in recliner) 100 feet with RW use.  6/10 B LE pain (nurse notified and brought pt pain medication beginning of session).  Will continue to focus on strengthening, balance, and progressive functional mobility during hospitalization.    Recommendations for follow up therapy are one component of a multi-disciplinary discharge planning process, led by the attending physician.  Recommendations may be updated based on patient status, additional functional criteria and insurance authorization.        Assistance Recommended at Discharge Frequent or constant Supervision/Assistance  Patient can return home with the following A little help with walking and/or transfers;A lot of help with bathing/dressing/bathroom;Assistance with cooking/housework;Direct supervision/assist for medications management;Direct supervision/assist for financial management;Assist for transportation;Help with stairs or ramp for entrance   Equipment  Recommendations  None recommended by PT    Recommendations for Other Services       Precautions / Restrictions Precautions Precautions: Fall Restrictions Weight Bearing Restrictions: No     Mobility  Bed Mobility               General bed mobility comments: Deferred (pt in recliner beginning/end of session)    Transfers Overall transfer level: Needs assistance Equipment used: Rolling walker (2 wheels) Transfers: Sit to/from Stand Sit to Stand: Mod assist           General transfer comment: x3 trials standing from recliner; assist to block L foot from slipping forward; assist to initiate stand and come to full upright position and to steady; vc's for UE/LE placement and overall technique    Ambulation/Gait Ambulation/Gait assistance: Min guard Gait Distance (Feet):  (110 feet; 100 feet) Assistive device: Rolling walker (2 wheels) Gait Pattern/deviations: Trunk flexed, Decreased step length - right, Decreased step length - left Gait velocity: decreased     General Gait Details: vc's for upright posture; pt tending to walk with flexed posture and short shuffling steps   Stairs             Wheelchair Mobility    Modified Rankin (Stroke Patients Only)       Balance Overall balance assessment: Needs assistance Sitting-balance support: No upper extremity supported, Feet supported Sitting balance-Leahy Scale: Good Sitting balance - Comments: steady sitting reaching within BOS   Standing balance support: Bilateral upper extremity supported, During functional activity, Reliant on assistive device for balance Standing balance-Leahy Scale: Fair                              Cognition Arousal/Alertness: Awake/alert Behavior During Therapy: WFL for tasks assessed/performed  Overall Cognitive Status: Within Functional Limits for tasks assessed                                          Exercises      General Comments  Pt  agreeable to PT session.      Pertinent Vitals/Pain Pain Assessment Pain Assessment: 0-10 Pain Score: 6  Pain Location: B LE's Pain Descriptors / Indicators: Aching, Sore Pain Intervention(s): Limited activity within patient's tolerance, Monitored during session, Repositioned Vitals (HR and O2 on room air) stable and WFL throughout treatment session.    Home Living                          Prior Function            PT Goals (current goals can now be found in the care plan section) Acute Rehab PT Goals Patient Stated Goal: to improve strength and mobility PT Goal Formulation: With patient Time For Goal Achievement: 10/13/22 Potential to Achieve Goals: Good Progress towards PT goals: Progressing toward goals    Frequency    Min 3X/week      PT Plan Current plan remains appropriate    Co-evaluation              AM-PAC PT "6 Clicks" Mobility   Outcome Measure  Help needed turning from your back to your side while in a flat bed without using bedrails?: A Little Help needed moving from lying on your back to sitting on the side of a flat bed without using bedrails?: A Little Help needed moving to and from a bed to a chair (including a wheelchair)?: A Little Help needed standing up from a chair using your arms (e.g., wheelchair or bedside chair)?: A Lot Help needed to walk in hospital room?: A Little Help needed climbing 3-5 steps with a railing? : A Lot 6 Click Score: 16    End of Session Equipment Utilized During Treatment: Gait belt Activity Tolerance: Patient tolerated treatment well;Patient limited by fatigue Patient left: in chair;with call bell/phone within reach;with chair alarm set Nurse Communication: Mobility status;Precautions;Patient requests pain meds (nursing gave pt pain meds beginning of session) PT Visit Diagnosis: Difficulty in walking, not elsewhere classified (R26.2);Muscle weakness (generalized) (M62.81)     Time: 4098-1191 PT  Time Calculation (min) (ACUTE ONLY): 29 min  Charges:  $Gait Training: 8-22 mins $Therapeutic Exercise: 8-22 mins                     Hendricks Limes, PT 10/09/22, 3:09 PM

## 2022-10-09 NOTE — Plan of Care (Signed)
  Problem: Education: Goal: Knowledge of General Education information will improve Description: Including pain rating scale, medication(s)/side effects and non-pharmacologic comfort measures Outcome: Progressing   Problem: Health Behavior/Discharge Planning: Goal: Ability to manage health-related needs will improve Outcome: Progressing   Problem: Clinical Measurements: Goal: Ability to maintain clinical measurements within normal limits will improve Outcome: Progressing   Problem: Coping: Goal: Level of anxiety will decrease Outcome: Progressing   Problem: Elimination: Goal: Will not experience complications related to bowel motility Outcome: Progressing   Problem: Pain Managment: Goal: General experience of comfort will improve Outcome: Progressing   Problem: Safety: Goal: Ability to remain free from injury will improve Outcome: Progressing   Problem: Skin Integrity: Goal: Risk for impaired skin integrity will decrease Outcome: Progressing   

## 2022-10-09 NOTE — Care Management Important Message (Signed)
Important Message  Patient Details  Name: Adam Rios. MRN: [id number] Date of Birth: Jun 11, 1940   Medicare Important Message Given:  Yes  I reviewed the Important Message from Medicare over the phone [phone]) since the patient is in an isolation room. He stated he understood his rights and I wished a speedy recovery and thanked him for his time.   Olegario Messier A Aaron Bostwick 10/09/2022, 2:31 PM

## 2022-10-09 NOTE — TOC Progression Note (Signed)
Transition of Care (TOC) - Progression Note    Patient Details  Name: Daimian Booth. MRN: [id number] Date of Birth: [birth year]  Transition of Care [facility]) CM/SW Contact  Allena Katz, LCSW Phone Number: 10/09/2022, 12:16 PM  Clinical Narrative:  CSW Left two voicemail's for Joy with [place] DSS to clarify plan.           Expected Discharge Plan and Services                                               Social Determinants of Health (SDOH) Interventions SDOH Screenings   Food Insecurity: No Food Insecurity (09/29/2022)  Housing: Low Risk  (09/29/2022)  Transportation Needs: No Transportation Needs (09/29/2022)  Utilities: Not At Risk (09/29/2022)  Tobacco Use: Medium Risk (09/29/2022)    Readmission Risk Interventions    08/11/2022    4:53 PM 06/11/2022    2:19 PM 02/12/2022    2:17 PM  Readmission Risk Prevention Plan  Transportation Screening Complete Complete Complete  PCP or Specialist Appt within 3-5 Days  Complete Complete  HRI or Home Care Consult  Complete Complete  Social Work Consult for Recovery Care Planning/Counseling  Complete Complete  Palliative Care Screening  Not Applicable Not Applicable  Medication Review Oceanographer) Complete Complete Referral to Pharmacy  [facility] or Home Care Consult Complete    Skilled Nursing Facility Complete

## 2022-10-09 NOTE — Progress Notes (Signed)
[facility] Progress Note  Patient: Adam Rios.    ZOX:[id number]  DOA: 09/28/2022     Date of Service: the patient was seen and examined on 10/09/2022  Chief Complaint  Patient presents with   Altered Mental Status   Brief hospital course: PMH of HTN, CVA, anxiety RCC, neurogenic bladder with chronic indwelling Foley catheter presented to hospital with complaints of unresponsive episode at home. Reportedly patient was giving blank stare. Currently being treated for UTI.  On 10/03/2022 the patient is more responsive, but still confused. Placement is pending. Home health has refused to see him as when they were caring for him before, they stopped when it appeared that the patient was unable to make decisions for himself. Dr. Allena Katz has determined that the patient is decisional. Awaiting placement.   Assessment and Plan:  Severe sepsis acute UTI secondary to chronic indwelling Foley catheter secondary to Proteus mirabilis infection. With tachycardia, tachypnea as well as acute kidney injury meeting criteria for severe sepsis at the time of admission with source of infection is UTI. S/p IV ceftriaxone x 2 doses  Foley catheter changed on 4/30. Blood cultures so far negative.  Urine culture growing Proteus.   On 5/2--5/8 oral Keflex based on sensitivity.  Completed course Sepsis physiology resolved. Monitor.   Acute metabolic encephalopathy, Resolved  Unresponsive episode, Original reason for patient's presentation. Mentation improving.  No focal deficit.  Able to follow all commands.  Likely combination of AKI as well as sepsis currently dehydration. Patient does appear to have capacity to make medical decisions as of evaluation on 5/3.  Patient does not appear to be making right decisions as he desires to go home with his severe deconditioning. 5/8 consulted psychiatrist to eval patient for decision-making capacity and as per psych patient does have decision-making capacity, and  we should respect his wishes.   AKI. Hypokalemia Resolved. K is 4.0  Creatinine is at baseline at 1.00 Monitor creatinine, electrolytes, and volume status.   Concern for A-fib with RVR-ruled out. Frequent PVC unknown NSVT Reported to have A-fib at the time of admission. On my evaluation on the EKG there are P waves seen in the lead II. Currently no evidence of A-fib for now. Most likely sinus tachycardia in the setting of sepsis at the time of admission. Continue Toprol-XL. Echocardiogram performed shows preserved EF at 55-60% with possible infiltrative process such as amyloid/sarcoid. There is moderate concentric LVH. There are no LV regional wall motion abnormalities. Normal diastolic parameters. Patient has established discharge with [facility] cardiology and recommend to follow-up with them outpatient.   Chronic diastolic CHF. On torsemide at home.  EF 55 to 60%. Currently euvolemic.  Has frequent visits to [facility] urgent care clinic for IV diuresis. Torsemide was resumed but renal function worsened therefore on hold again.   Normocytic anemia. Hemoglobin 10.9. On admission 13.3. Likely this is dilutional drop, no bleeding. Monitor.   Folic acid level 6.8, at lower end, started folic acid 1 mg p.o. daily.  Repeat folic acid level after 3 months  Vitamin D deficiency: started vitamin D 50,000 units p.o. weekly, follow with PCP to repeat vitamin D level after 3 to 6 months.  Dementia. On Aricept.  No behavioral issues.   Chronic neuropathy. Gabapentin nightly.   Will continue for now.   HLD. Continuing statin.   History of CVA. On aspirin and Plavix. Which I will continue.   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 Patient appears to have history of chronic intermittent catheterization for 10+ years. After his HoLEP in January 2024 he was unable to perform intermittent self-catheterization and had constant  urinary leakage. In February 2024 patient's primary urologist approved for placement of a chronic indwelling Foley catheter for incontinence. Patient appears to have recurrent UTIs since then including this admission. Discussed with urology, recommend to continue current management but recommend a voiding trial with urology outpatient since he has a Foley catheter for so long. Foley catheter already upped in size from 98 [place] to 16 [place] and continues to have leakage. Unable to perform urodynamic studies in the hospital. Outpatient follow-up with primary urologist recommended with Dr. Al Corpus. Continue Myrbetriq. 5/7 d/w urologist Dr Lonna Cobb, rec f/u with his primary urologist as an outpatient, no intervention. 5/8 discussed with another urologist Dr. Apolinar Junes who suggested to do bladder scan to rule out urinary retention and kinking of Foley, continue Myrbetriq, no any other intervention.    Social issues. It appears that the patient case is currently being evaluated by APS. There have been concerns that he is unable to make decisions for  himself. By Dr. Eliane Decree evaluation on 10/02/2022, he seemed to be decisional. In any case, he is not able to go home with home health as home health cannot follow him there, because they have determined that he cannot make his own decisions. TOC is working on discharge planning 5/6 DSS evaluate him and asked not to release the patient as per TOC.  Body mass index is 28.36 kg/m.  Interventions:       Diet: Heart healthy diet DVT Prophylaxis: Subcutaneous Lovenox   Advance goals of care discussion: Full code  Family Communication: family was not present at bedside, at the time of interview.  The pt provided permission to discuss medical plan with the family. Opportunity was given to ask question and all questions were answered satisfactorily.   Disposition:  Pt is from Home, admitted with unresponsiveness, sepsis, UTI, AKI, chronic Foley catheter,  descending, awaiting for placement, TOC consulted for discharge planning, Discharge to Home with HH.  Stable to discharge, awaiting for all equipments to be delivered as per [facility], most likely discharge on Saturday, 10/10/2022.   Subjective: No significant events overnight, patient was sitting comfortably in the recliner, he was talking to someone over the phone.  Did not offer any complaints.   Physical Exam: General: NAD, lying comfortably Appear in no distress, affect appropriate Eyes: PERRLA ENT: Oral Mucosa Clear, moist  Neck: no JVD,  Cardiovascular: S1 and S2 Present, no Murmur,  Respiratory: good respiratory effort, Bilateral Air entry equal and Decreased, no Crackles, no wheezes Abdomen: Bowel Sound present, Soft and no tenderness,  Skin: no rashes Extremities: no Pedal edema, no calf tenderness Neurologic: without any new focal findings Gait not checked due to patient safety concerns  Vitals:   10/08/22 2355 10/09/22 0510 10/09/22 0703 10/09/22 0816  BP: 123/66 111/74  136/71  Pulse: 64 60  (!) 59  Resp: 19 20  18   Temp: 98.3 F (36.8 C) 98.1 F (36.7 C)  (!) 97.5 F (36.4 C)  TempSrc:    Oral  SpO2: 99% 97%  99%  Weight:   95.1 kg   Height:        Intake/Output Summary (Last 24 hours) at 10/09/2022 1536 Last data filed at 10/09/2022 1410 Gross per 24 hour  Intake 360 ml  Output 700 ml  Net -340 ml    Filed  Weights   10/07/22 0230 10/08/22 0705 10/09/22 0703  Weight: 94.8 kg 97 kg 95.1 kg    Data Reviewed: I have personally reviewed and interpreted daily labs, tele strips, imagings as discussed above. I reviewed all nursing notes, pharmacy notes, vitals, pertinent old records I have discussed plan of care as described above with RN and patient/family.  CBC: Recent Labs  Lab 10/03/22 0434 10/04/22 0521 10/06/22 0415 10/09/22 0459  WBC 6.1 6.8 6.9 7.5  NEUTROABS  --  4.0  --   --   HGB 10.9* 11.0* 10.9* 10.7*  HCT 34.4* 35.2* 34.5* 34.5*  MCV 84.3  85.2 84.4 85.2  PLT 168 180 199 223   Basic Metabolic Panel: Recent Labs  Lab 10/03/22 0434 10/04/22 0521 10/05/22 0542 10/06/22 0415 10/09/22 0459  NA 138 138  --  136 137  K 3.7 3.8  --  4.0 4.0  CL 109 108  --  107 107  CO2 22 24  --  21* 22  GLUCOSE 90 85  --  89 86  BUN 17 14  --  13 13  CREATININE 1.06 1.00 0.92 0.93 0.97  CALCIUM 8.3* 8.0*  --  8.3* 8.2*  MG 2.1  --   --  2.2 2.2  PHOS  --   --   --  3.0 2.8    Studies: No results found.  Scheduled Meds:  aspirin EC  81 mg Oral Daily   atorvastatin  40 mg Oral Daily   Chlorhexidine Gluconate Cloth  6 each Topical Daily   clopidogrel  75 mg Oral Daily   cyanocobalamin  1,000 mcg Oral Daily   donepezil  10 mg Oral QHS   enoxaparin (LOVENOX) injection  40 mg Subcutaneous Q24H   finasteride  5 mg Oral Daily   folic acid  1 mg Oral Daily   gabapentin  300 mg Oral QHS   iron polysaccharides  150 mg Oral Daily   magic mouthwash  10 mL Oral QID   metoprolol tartrate  12.5 mg Oral BID   mirabegron ER  50 mg Oral Daily   pantoprazole  40 mg Oral Daily   Vitamin D (Ergocalciferol)  50,000 Units Oral Q7 days   Continuous Infusions: PRN Meds: acetaminophen, phenol, polyethylene glycol, senna-docusate, traMADol  Time spent: 35 minutes  Author: Gillis Santa. MD [facility] 10/09/2022 3:36 PM  To reach On-call, see care teams to locate the attending and reach out to them via [email or web address]. If 7PM-7AM, please contact night-coverage If you still have difficulty reaching the attending provider, please page the [facility] (Director on Call) for [facility] on amion for assistance.

## 2022-10-09 NOTE — Progress Notes (Signed)
Occupational Therapy Treatment Patient Details Name: Adam Rios. MRN: [id number] DOB: 04/08/1941 Today's Date: 10/09/2022   History of present illness Adam Rios is an 82 y/o with a medical history significant of recurrent UTIs, cognitive impairment, history of CVA, essential hypertension, renal cell carcinoma, neurogenic bladder, chronic indwelling Foley catheter, some dementia, insomnia, chronic diastolic heart failure and history of right lower extremity cellulitis. He has had a PPM placed and 09/08/22 had a cardiomems implant via RHC to the pulmonary artery for assist in management of HFpEF. MD assessment includes: Severe sepsis and acute UTI secondary to chronic indwelling Foley catheter secondary to Proteus mirabilis infection, acute metabolic encephalopathy, AKI, and normocytic anemia.   OT comments  Upon entering the room, pt seated in recliner chair and agreeable to OT intervention. Pt reports having a good morning and recalling breakfast items. Pt declined ADL tasks this session when offered and requested to "walk some more". Pt needing mod lifting assistance to stands from recliner chair. He showed good safety awareness with proper hand placement for transfer. Pt ambulates 120' with RW with min guard - min A for safety. Pt does report fatigue and UE weakness on walker when returning to recliner chair. Pt discussing PACE program as being something he is interested in at discharge and overall in better spirits. Pt remains in recliner chair with chair alarm activated and all needs within reach.    Recommendations for follow up therapy are one component of a multi-disciplinary discharge planning process, led by the attending physician.  Recommendations may be updated based on patient status, additional functional criteria and insurance authorization.    Assistance Recommended at Discharge Frequent or constant Supervision/Assistance  Patient can return home with the following  A little help  with bathing/dressing/bathroom;A little help with walking and/or transfers;Assistance with cooking/housework;Direct supervision/assist for medications management;Direct supervision/assist for financial management;Assist for transportation;Help with stairs or ramp for entrance   Equipment Recommendations  Other (comment) (defer to next venue of care)       Precautions / Restrictions Precautions Precautions: Fall       Mobility Bed Mobility               General bed mobility comments: seated in recliner chair    Transfers Overall transfer level: Needs assistance Equipment used: Rolling walker (2 wheels) Transfers: Sit to/from Stand Sit to Stand: Mod assist                 Balance Overall balance assessment: Needs assistance Sitting-balance support: Feet supported Sitting balance-Leahy Scale: Good     Standing balance support: Bilateral upper extremity supported, During functional activity, Reliant on assistive device for balance Standing balance-Leahy Scale: Poor                             ADL either performed or assessed with clinical judgement   ADL                                         General ADL Comments: pt declined self care tasks    Extremity/Trunk Assessment Upper Extremity Assessment Upper Extremity Assessment: Generalized weakness   Lower Extremity Assessment Lower Extremity Assessment: Generalized weakness        Vision Patient Visual Report: No change from baseline            Cognition Arousal/Alertness: Awake/alert  Behavior During Therapy: WFL for tasks assessed/performed Overall Cognitive Status: Within Functional Limits for tasks assessed                                                     Pertinent Vitals/ Pain       Pain Assessment Pain Assessment: No/denies pain         Frequency  Min 2X/week        Progress Toward Goals  OT Goals(current goals can now be  found in the care plan section)  Progress towards OT goals: Progressing toward goals     Plan Discharge plan remains appropriate;Frequency remains appropriate       AM-PAC OT "6 Clicks" Daily Activity     Outcome Measure   Help from another person eating meals?: None Help from another person taking care of personal grooming?: A Little Help from another person toileting, which includes using toliet, bedpan, or urinal?: A Little Help from another person bathing (including washing, rinsing, drying)?: A Lot Help from another person to put on and taking off regular upper body clothing?: A Little Help from another person to put on and taking off regular lower body clothing?: A Lot 6 Click Score: 17    End of Session Equipment Utilized During Treatment: Rolling walker (2 wheels)  OT Visit Diagnosis: Unsteadiness on feet (R26.81)   Activity Tolerance Patient tolerated treatment well   Patient Left in bed;with call bell/phone within reach;with bed alarm set   Nurse Communication Mobility status        Time: 1041-1101 OT Time Calculation (min): 20 min  Charges: OT General Charges $OT Visit: 1 Visit OT Treatments $Therapeutic Activity: 8-22 mins  Jackquline Denmark, MS, OTR/L , CBIS ascom [phone]  10/09/22, 12:55 PM

## 2022-10-10 NOTE — Progress Notes (Signed)
[facility] Progress Note  Patient: Adam Rios.    ZOX:[id number]  DOA: 09/28/2022     Date of Service: the patient was seen and examined on 10/10/2022  Chief Complaint  Patient presents with   Altered Mental Status   Brief hospital course: PMH of HTN, CVA, anxiety RCC, neurogenic bladder with chronic indwelling Foley catheter presented to hospital with complaints of unresponsive episode at home. Reportedly patient was giving blank stare. Currently being treated for UTI.  On 10/03/2022 the patient is more responsive, but still confused. Placement is pending. Home health has refused to see him as when they were caring for him before, they stopped when it appeared that the patient was unable to make decisions for himself. Dr. Allena Katz has determined that the patient is decisional. Awaiting placement.   Assessment and Plan:  Severe sepsis acute UTI secondary to chronic indwelling Foley catheter secondary to Proteus mirabilis infection. With tachycardia, tachypnea as well as acute kidney injury meeting criteria for severe sepsis at the time of admission with source of infection is UTI. S/p IV ceftriaxone x 2 doses  Foley catheter changed on 4/30. Blood cultures so far negative.  Urine culture growing Proteus.   On 5/2--5/8 oral Keflex based on sensitivity.  Completed course Sepsis physiology resolved. Monitor.   Acute metabolic encephalopathy, Resolved  Unresponsive episode, Original reason for patient's presentation. Mentation improving.  No focal deficit.  Able to follow all commands.  Likely combination of AKI as well as sepsis currently dehydration. Patient does appear to have capacity to make medical decisions as of evaluation on 5/3.  Patient does not appear to be making right decisions as he desires to go home with his severe deconditioning. 5/8 consulted psychiatrist to eval patient for decision-making capacity and as per psych patient does have decision-making capacity, and  we should respect his wishes.   AKI. Hypokalemia Resolved. K is 4.0  Creatinine is at baseline at 1.00 Monitor creatinine, electrolytes, and volume status.   Concern for A-fib with RVR-ruled out. Frequent PVC unknown NSVT Reported to have A-fib at the time of admission. On my evaluation on the EKG there are P waves seen in the lead II. Currently no evidence of A-fib for now. Most likely sinus tachycardia in the setting of sepsis at the time of admission. Continue Toprol-XL. Echocardiogram performed shows preserved EF at 55-60% with possible infiltrative process such as amyloid/sarcoid. There is moderate concentric LVH. There are no LV regional wall motion abnormalities. Normal diastolic parameters. Patient has established discharge with [facility] cardiology and recommend to follow-up with them outpatient.   Chronic diastolic CHF. On torsemide at home.  EF 55 to 60%. Currently euvolemic.  Has frequent visits to [facility] urgent care clinic for IV diuresis. Torsemide was resumed but renal function worsened therefore on hold again.   Normocytic anemia. Hemoglobin 10.9. On admission 13.3. Likely this is dilutional drop, no bleeding. Monitor.   Folic acid level 6.8, at lower end, started folic acid 1 mg p.o. daily.  Repeat folic acid level after 3 months  Vitamin D deficiency: started vitamin D 50,000 units p.o. weekly, follow with PCP to repeat vitamin D level after 3 to 6 months.  Dementia. On Aricept.  No behavioral issues.   Chronic neuropathy. Gabapentin nightly.   Will continue for now.   HLD. Continuing statin.   History of CVA. On aspirin and Plavix. Which I will continue.   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 Patient appears to have history of chronic intermittent catheterization for 10+ years. After his HoLEP in January 2024 he was unable to perform intermittent self-catheterization and had constant  urinary leakage. In February 2024 patient's primary urologist approved for placement of a chronic indwelling Foley catheter for incontinence. Patient appears to have recurrent UTIs since then including this admission. Discussed with urology, recommend to continue current management but recommend a voiding trial with urology outpatient since he has a Foley catheter for so long. Foley catheter already upped in size from 26 [place] to 16 [place] and continues to have leakage. Unable to perform urodynamic studies in the hospital. Outpatient follow-up with primary urologist recommended with Dr. Al Corpus. Continue Myrbetriq. 5/7 d/w urologist Dr Lonna Cobb, rec f/u with his primary urologist as an outpatient, no intervention. 5/8 discussed with another urologist Dr. Apolinar Junes who suggested to do bladder scan to rule out urinary retention and kinking of Foley, continue Myrbetriq, no any other intervention.    Social issues. It appears that the patient case is currently being evaluated by APS. There have been concerns that he is unable to make decisions for  himself. By Dr. Eliane Decree evaluation on 10/02/2022, he seemed to be decisional. In any case, he is not able to go home with home health as home health cannot follow him there, because they have determined that he cannot make his own decisions. TOC is working on discharge planning 5/6 DSS evaluate him and asked not to release the patient as per TOC.  Body mass index is 28.36 kg/m.  Interventions:       Diet: Heart healthy diet DVT Prophylaxis: Subcutaneous Lovenox   Advance goals of care discussion: Full code  Family Communication: family was not present at bedside, at the time of interview.  The pt provided permission to discuss medical plan with the family. Opportunity was given to ask question and all questions were answered satisfactorily.   Disposition:  Pt is from Home, admitted with unresponsiveness, sepsis, UTI, AKI, chronic Foley catheter,  descending, awaiting for placement, TOC consulted for discharge planning, Discharge to Home with HH.  Stable to discharge, awaiting for all equipments to be delivered as per [facility], most likely discharge on Monday, 10/12/2022.   Subjective: No significant events overnight, patient denies any complaints, sitting comfortably on the recliner.  Still awaiting for final recommendation from MBSS.  Physical Exam: General: NAD, lying comfortably Appear in no distress, affect appropriate Eyes: PERRLA ENT: Oral Mucosa Clear, moist  Neck: no JVD,  Cardiovascular: S1 and S2 Present, no Murmur,  Respiratory: good respiratory effort, Bilateral Air entry equal and Decreased, no Crackles, no wheezes Abdomen: Bowel Sound present, Soft and no tenderness,  Skin: no rashes Extremities: no Pedal edema, no calf tenderness Neurologic: without any new focal findings Gait not checked due to patient safety concerns  Vitals:   10/09/22 2010 10/10/22 0433 10/10/22 0500 10/10/22 0800  BP: 115/77 128/76  120/80  Pulse: 89 61  68  Resp: 18 18  18   Temp: 98 F (36.7 C) 98.2 F (36.8 C)    TempSrc: Oral     SpO2: 97% 97%    Weight:   94.6 kg   Height:        Intake/Output Summary (Last 24 hours) at 10/10/2022 1359 Last data filed at 10/10/2022 1018 Gross per 24 hour  Intake 600 ml  Output 650 ml  Net -50 ml    Filed Weights   10/08/22 0705 10/09/22 0703 10/10/22 0500  Weight: 97 kg 95.1 kg 94.6 kg    Data Reviewed: I have personally reviewed and interpreted daily labs, tele strips, imagings as discussed above. I reviewed all nursing notes, pharmacy notes, vitals, pertinent old records I have discussed plan of care as described above with RN and patient/family.  CBC: Recent Labs  Lab 10/04/22 0521 10/06/22 0415 10/09/22 0459  WBC 6.8 6.9 7.5  NEUTROABS 4.0  --   --   HGB 11.0* 10.9* 10.7*  HCT 35.2* 34.5* 34.5*  MCV 85.2 84.4 85.2  PLT 180 199 223   Basic Metabolic Panel: Recent Labs  Lab  10/04/22 0521 10/05/22 0542 10/06/22 0415 10/09/22 0459  NA 138  --  136 137  K 3.8  --  4.0 4.0  CL 108  --  107 107  CO2 24  --  21* 22  GLUCOSE 85  --  89 86  BUN 14  --  13 13  CREATININE 1.00 0.92 0.93 0.97  CALCIUM 8.0*  --  8.3* 8.2*  MG  --   --  2.2 2.2  PHOS  --   --  3.0 2.8    Studies: No results found.  Scheduled Meds:  aspirin EC  81 mg Oral Daily   atorvastatin  40 mg Oral Daily   Chlorhexidine Gluconate Cloth  6 each Topical Daily   clopidogrel  75 mg Oral Daily   cyanocobalamin  1,000 mcg Oral Daily   donepezil  10 mg Oral QHS   enoxaparin (LOVENOX) injection  40 mg Subcutaneous Q24H   finasteride  5 mg Oral Daily   folic acid  1 mg Oral Daily   gabapentin  300 mg Oral QHS   iron polysaccharides  150 mg Oral Daily   magic mouthwash  10 mL Oral QID   metoprolol tartrate  12.5 mg Oral BID   mirabegron ER  50 mg Oral Daily   pantoprazole  40 mg Oral Daily   Vitamin D (Ergocalciferol)  50,000 Units Oral Q7 days   Continuous Infusions: PRN Meds: acetaminophen, phenol, polyethylene glycol, senna-docusate, traMADol  Time spent: 35 minutes  Author: Gillis Santa. MD [facility] 10/10/2022 1:59 PM  To reach On-call, see care teams to locate the attending and reach out to them via [email or web address]. If 7PM-7AM, please contact night-coverage If you still have difficulty reaching the attending provider, please page the [facility] (Director on Call) for [facility] on amion for assistance.

## 2022-10-10 NOTE — Plan of Care (Signed)

## 2022-10-11 NOTE — Progress Notes (Signed)
[facility] Progress Note  Patient: Adam Rios.    WUJ:[id number]  DOA: 09/28/2022     Date of Service: the patient was seen and examined on 10/11/2022  Chief Complaint  Patient presents with   Altered Mental Status   Brief hospital course: PMH of HTN, CVA, anxiety RCC, neurogenic bladder with chronic indwelling Foley catheter presented to hospital with complaints of unresponsive episode at home. Reportedly patient was giving blank stare. Currently being treated for UTI.  On 10/03/2022 the patient is more responsive, but still confused. Placement is pending. Home health has refused to see him as when they were caring for him before, they stopped when it appeared that the patient was unable to make decisions for himself. Dr. Allena Katz has determined that the patient is decisional. Awaiting placement.   Assessment and Plan:  Severe sepsis acute UTI secondary to chronic indwelling Foley catheter secondary to Proteus mirabilis infection. With tachycardia, tachypnea as well as acute kidney injury meeting criteria for severe sepsis at the time of admission with source of infection is UTI. S/p IV ceftriaxone x 2 doses  Foley catheter changed on 4/30. Blood cultures so far negative.  Urine culture growing Proteus.   On 5/2--5/8 oral Keflex based on sensitivity.  Completed course Sepsis physiology resolved. Monitor.   Acute metabolic encephalopathy, Resolved  Unresponsive episode, Original reason for patient's presentation. Mentation improving.  No focal deficit.  Able to follow all commands.  Likely combination of AKI as well as sepsis currently dehydration. Patient does appear to have capacity to make medical decisions as of evaluation on 5/3.  Patient does not appear to be making right decisions as he desires to go home with his severe deconditioning. 5/8 consulted psychiatrist to eval patient for decision-making capacity and as per psych patient does have decision-making capacity, and  we should respect his wishes.   AKI. Hypokalemia Resolved. K is 4.0  Creatinine is at baseline at 1.00 Monitor creatinine, electrolytes, and volume status.   Concern for A-fib with RVR-ruled out. Frequent PVC unknown NSVT Reported to have A-fib at the time of admission. On my evaluation on the EKG there are P waves seen in the lead II. Currently no evidence of A-fib for now. Most likely sinus tachycardia in the setting of sepsis at the time of admission. Continue Toprol-XL. Echocardiogram performed shows preserved EF at 55-60% with possible infiltrative process such as amyloid/sarcoid. There is moderate concentric LVH. There are no LV regional wall motion abnormalities. Normal diastolic parameters. Patient has established discharge with [facility] cardiology and recommend to follow-up with them outpatient.   Chronic diastolic CHF. On torsemide at home.  EF 55 to 60%. Currently euvolemic.  Has frequent visits to [facility] urgent care clinic for IV diuresis. Torsemide was resumed but renal function worsened therefore on hold again.   Normocytic anemia. Hemoglobin 10.9. On admission 13.3. Likely this is dilutional drop, no bleeding. Monitor.   Folic acid level 6.8, at lower end, started folic acid 1 mg p.o. daily.  Repeat folic acid level after 3 months  Vitamin D deficiency: started vitamin D 50,000 units p.o. weekly, follow with PCP to repeat vitamin D level after 3 to 6 months.  Dementia. On Aricept.  No behavioral issues.   Chronic neuropathy. Gabapentin nightly.   Will continue for now.   HLD. Continuing statin.   History of CVA. On aspirin and Plavix. Which I will continue.   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 Patient appears to have history of chronic intermittent catheterization for 10+ years. After his HoLEP in January 2024 he was unable to perform intermittent self-catheterization and had constant  urinary leakage. In February 2024 patient's primary urologist approved for placement of a chronic indwelling Foley catheter for incontinence. Patient appears to have recurrent UTIs since then including this admission. Discussed with urology, recommend to continue current management but recommend a voiding trial with urology outpatient since he has a Foley catheter for so long. Foley catheter already upped in size from 4 [place] to 16 [place] and continues to have leakage. Unable to perform urodynamic studies in the hospital. Outpatient follow-up with primary urologist recommended with Dr. Al Corpus. Continue Myrbetriq. 5/7 d/w urologist Dr Lonna Cobb, rec f/u with his primary urologist as an outpatient, no intervention. 5/8 discussed with another urologist Dr. Apolinar Junes who suggested to do bladder scan to rule out urinary retention and kinking of Foley, continue Myrbetriq, no any other intervention.    Social issues. It appears that the patient case is currently being evaluated by APS. There have been concerns that he is unable to make decisions for  himself. By Dr. Eliane Decree evaluation on 10/02/2022, he seemed to be decisional. In any case, he is not able to go home with home health as home health cannot follow him there, because they have determined that he cannot make his own decisions. TOC is working on discharge planning 5/6 DSS evaluate him and asked not to release the patient as per TOC.  Body mass index is 28.36 kg/m.  Interventions:       Diet: Heart healthy diet DVT Prophylaxis: Subcutaneous Lovenox   Advance goals of care discussion: Full code  Family Communication: family was not present at bedside, at the time of interview.  The pt provided permission to discuss medical plan with the family. Opportunity was given to ask question and all questions were answered satisfactorily.   Disposition:  Pt is from Home, admitted with unresponsiveness, sepsis, UTI, AKI, chronic Foley catheter,  descending, awaiting for placement, TOC consulted for discharge planning, Discharge to Home with HH.  Stable to discharge, awaiting for all equipments to be delivered as per [facility], most likely discharge on Monday, 10/12/2022.   Subjective: No significant events overnight, patient was laying comfortably in the bed.  Denies any complaints, we are still awaiting for final recommendation from DSS, TOC is following for discharge planning most likely tomorrow a.m.   Physical Exam: General: NAD, lying comfortably Appear in no distress, affect appropriate Eyes: PERRLA ENT: Oral Mucosa Clear, moist  Neck: no JVD,  Cardiovascular: S1 and S2 Present, no Murmur,  Respiratory: good respiratory effort, Bilateral Air entry equal and Decreased, no Crackles, no wheezes Abdomen: Bowel Sound present, Soft and no tenderness,  Skin: no rashes Extremities: no Pedal edema, no calf tenderness Neurologic: without any new focal findings Gait not checked due to patient safety concerns  Vitals:   10/11/22 0429 10/11/22 0500 10/11/22 0817 10/11/22 0839  BP: 115/74  130/70 132/72  Pulse: (!) 59  68 (!) 59  Resp: 16   16  Temp: 98.4 F (36.9 C)   (!) 97.4 F (36.3 C)  TempSrc:      SpO2: 99%   100%  Weight:  95.3 kg    Height:        Intake/Output Summary (Last 24 hours) at 10/11/2022 1628 Last data filed at 10/11/2022 1423 Gross per 24 hour  Intake 240 ml  Output 1275 ml  Net -1035 ml    Filed Weights   10/09/22 0703 10/10/22 0500 10/11/22 0500  Weight: 95.1 kg 94.6 kg 95.3 kg    Data Reviewed: I have personally reviewed and interpreted daily labs, tele strips, imagings as discussed above. I reviewed all nursing notes, pharmacy notes, vitals, pertinent old records I have discussed plan of care as described above with RN and patient/family.  CBC: Recent Labs  Lab 10/06/22 0415 10/09/22 0459  WBC 6.9 7.5  HGB 10.9* 10.7*  HCT 34.5* 34.5*  MCV 84.4 85.2  PLT 199 223   Basic Metabolic  Panel: Recent Labs  Lab 10/05/22 0542 10/06/22 0415 10/09/22 0459  NA  --  136 137  K  --  4.0 4.0  CL  --  107 107  CO2  --  21* 22  GLUCOSE  --  89 86  BUN  --  13 13  CREATININE 0.92 0.93 0.97  CALCIUM  --  8.3* 8.2*  MG  --  2.2 2.2  PHOS  --  3.0 2.8    Studies: No results found.  Scheduled Meds:  aspirin EC  81 mg Oral Daily   atorvastatin  40 mg Oral Daily   Chlorhexidine Gluconate Cloth  6 each Topical Daily   clopidogrel  75 mg Oral Daily   cyanocobalamin  1,000 mcg Oral Daily   donepezil  10 mg Oral QHS   enoxaparin (LOVENOX) injection  40 mg Subcutaneous Q24H   finasteride  5 mg Oral Daily   folic acid  1 mg Oral Daily   gabapentin  300 mg Oral QHS   iron polysaccharides  150 mg Oral Daily   magic mouthwash  10 mL Oral QID   metoprolol tartrate  12.5 mg Oral BID   mirabegron ER  50 mg Oral Daily   pantoprazole  40 mg Oral Daily   Vitamin D (Ergocalciferol)  50,000 Units Oral Q7 days   Continuous Infusions: PRN Meds: acetaminophen, phenol, polyethylene glycol, senna-docusate, traMADol  Time spent: 35 minutes  Author: Gillis Santa. MD [facility] 10/11/2022 4:28 PM  To reach On-call, see care teams to locate the attending and reach out to them via [email or web address]. If 7PM-7AM, please contact night-coverage If you still have difficulty reaching the attending provider, please page the [facility] (Director on Call) for [facility] on amion for assistance.

## 2022-10-12 LAB — CREATININE, SERUM
Creatinine, Ser: 0.99 mg/dL (ref 0.61–1.24)
GFR, Estimated: >60 mL/min (ref >60)

## 2022-10-12 NOTE — Progress Notes (Signed)
[facility] Progress Note  Patient: Adam Rios.    ZOX:[id number]  DOA: 09/28/2022     Date of Service: the patient was seen and examined on 10/12/2022  Chief Complaint  Patient presents with   Altered Mental Status   Brief hospital course: PMH of HTN, CVA, anxiety RCC, neurogenic bladder with chronic indwelling Foley catheter presented to hospital with complaints of unresponsive episode at home. Reportedly patient was giving blank stare. Currently being treated for UTI.  On 10/03/2022 the patient is more responsive, but still confused. Placement is pending. Home health has refused to see him as when they were caring for him before, they stopped when it appeared that the patient was unable to make decisions for himself. Dr. Allena Katz has determined that the patient is decisional. Awaiting placement.   Assessment and Plan:  Severe sepsis acute UTI secondary to chronic indwelling Foley catheter secondary to Proteus mirabilis infection. With tachycardia, tachypnea as well as acute kidney injury meeting criteria for severe sepsis at the time of admission with source of infection is UTI. S/p IV ceftriaxone x 2 doses  Foley catheter changed on 4/30. Blood cultures so far negative.  Urine culture growing Proteus.   On 5/2--5/8 oral Keflex based on sensitivity.  Completed course Sepsis physiology resolved. Monitor.   Acute metabolic encephalopathy, Resolved  Unresponsive episode, Original reason for patient's presentation. Mentation improving.  No focal deficit.  Able to follow all commands.  Likely combination of AKI as well as sepsis currently dehydration. Patient does appear to have capacity to make medical decisions as of evaluation on 5/3.  Patient does not appear to be making right decisions as he desires to go home with his severe deconditioning. 5/8 consulted psychiatrist to eval patient for decision-making capacity and as per psych patient does have decision-making capacity, and  we should respect his wishes.   AKI. Hypokalemia Resolved. K is 4.0  Creatinine is at baseline at 1.00 Monitor creatinine, electrolytes, and volume status.   Concern for A-fib with RVR-ruled out. Frequent PVC unknown NSVT Reported to have A-fib at the time of admission. On my evaluation on the EKG there are P waves seen in the lead II. Currently no evidence of A-fib for now. Most likely sinus tachycardia in the setting of sepsis at the time of admission. Continue Toprol-XL. Echocardiogram performed shows preserved EF at 55-60% with possible infiltrative process such as amyloid/sarcoid. There is moderate concentric LVH. There are no LV regional wall motion abnormalities. Normal diastolic parameters. Patient has established discharge with [facility] cardiology and recommend to follow-up with them outpatient.   Chronic diastolic CHF. On torsemide at home.  EF 55 to 60%. Currently euvolemic.  Has frequent visits to [facility] urgent care clinic for IV diuresis. Torsemide was resumed but renal function worsened therefore on hold again.   Normocytic anemia. Hemoglobin 10.9. On admission 13.3. Likely this is dilutional drop, no bleeding. Monitor.   Folic acid level 6.8, at lower end, started folic acid 1 mg p.o. daily.  Repeat folic acid level after 3 months  Vitamin D deficiency: started vitamin D 50,000 units p.o. weekly, follow with PCP to repeat vitamin D level after 3 to 6 months.  Dementia. On Aricept.  No behavioral issues.   Chronic neuropathy. Gabapentin nightly.   Will continue for now.   HLD. Continuing statin.   History of CVA. On aspirin and Plavix. Which I will continue.   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 Patient appears to have history of chronic intermittent catheterization for 10+ years. After his HoLEP in January 2024 he was unable to perform intermittent self-catheterization and had constant  urinary leakage. In February 2024 patient's primary urologist approved for placement of a chronic indwelling Foley catheter for incontinence. Patient appears to have recurrent UTIs since then including this admission. Discussed with urology, recommend to continue current management but recommend a voiding trial with urology outpatient since he has a Foley catheter for so long. Foley catheter already upped in size from 58 [place] to 16 [place] and continues to have leakage. Unable to perform urodynamic studies in the hospital. Outpatient follow-up with primary urologist recommended with Dr. Al Corpus. Continue Myrbetriq. 5/7 d/w urologist Dr Lonna Cobb, rec f/u with his primary urologist as an outpatient, no intervention. 5/8 discussed with another urologist Dr. Apolinar Junes who suggested to do bladder scan to rule out urinary retention and kinking of Foley, continue Myrbetriq, no any other intervention.    Social issues. It appears that the patient case is currently being evaluated by APS. There have been concerns that he is unable to make decisions for  himself. By Dr. Eliane Decree evaluation on 10/02/2022, he seemed to be decisional. In any case, he is not able to go home with home health as home health cannot follow him there, because they have determined that he cannot make his own decisions. TOC is working on discharge planning 5/6 DSS evaluate him and asked not to release the patient as per TOC.  Body mass index is 28.36 kg/m.  Interventions:       Diet: Heart healthy diet DVT Prophylaxis: Subcutaneous Lovenox   Advance goals of care discussion: Full code  Family Communication: family was not present at bedside, at the time of interview.  The pt provided permission to discuss medical plan with the family. Opportunity was given to ask question and all questions were answered satisfactorily.   Disposition:  Pt is from Home, admitted with unresponsiveness, sepsis, UTI, AKI, chronic Foley catheter,  descending, awaiting for placement, TOC consulted for discharge planning, Discharge to Home with HH.  Stable to discharge, awaiting for all equipments to be delivered as per [facility], most likely discharge on Tuesday, 10/13/2022.   Subjective: No significant events overnight, patient was laying comfortably in the bed.  Denies any complaints   Physical Exam: General: NAD, lying comfortably Appear in no distress, affect appropriate Eyes: PERRLA ENT: Oral Mucosa Clear, moist  Neck: no JVD,  Cardiovascular: S1 and S2 Present, no Murmur,  Respiratory: good respiratory effort, Bilateral Air entry equal and Decreased, no Crackles, no wheezes Abdomen: Bowel Sound present, Soft and no tenderness,  Skin: no rashes Extremities: no Pedal edema, no calf tenderness Neurologic: without any new focal findings Gait not checked due to patient safety concerns  Vitals:   10/12/22 0540 10/12/22 0949 10/12/22 0950 10/12/22 1609  BP: 116/74 119/70  129/70  Pulse: 65 (!) 102 96 66  Resp: 18 18  16   Temp: 98.9 F (37.2 C) 98 F (36.7 C)  98 F (36.7 C)  TempSrc:    Oral  SpO2: 98% 98%  99%  Weight:      Height:        Intake/Output Summary (Last 24 hours) at 10/12/2022 1633 Last data filed at 10/12/2022 4098 Gross per 24 hour  Intake 240 ml  Output 950 ml  Net -710 ml    Filed Weights   10/09/22 0703 10/10/22 0500 10/11/22 0500  Weight: 95.1  kg 94.6 kg 95.3 kg    Data Reviewed: I have personally reviewed and interpreted daily labs, tele strips, imagings as discussed above. I reviewed all nursing notes, pharmacy notes, vitals, pertinent old records I have discussed plan of care as described above with RN and patient/family.  CBC: Recent Labs  Lab 10/06/22 0415 10/09/22 0459  WBC 6.9 7.5  HGB 10.9* 10.7*  HCT 34.5* 34.5*  MCV 84.4 85.2  PLT 199 223   Basic Metabolic Panel: Recent Labs  Lab 10/06/22 0415 10/09/22 0459 10/12/22 0438  NA 136 137  --   K 4.0 4.0  --   CL 107 107  --    CO2 21* 22  --   GLUCOSE 89 86  --   BUN 13 13  --   CREATININE 0.93 0.97 0.99  CALCIUM 8.3* 8.2*  --   MG 2.2 2.2  --   PHOS 3.0 2.8  --     Studies: No results found.  Scheduled Meds:  aspirin EC  81 mg Oral Daily   atorvastatin  40 mg Oral Daily   Chlorhexidine Gluconate Cloth  6 each Topical Daily   clopidogrel  75 mg Oral Daily   cyanocobalamin  1,000 mcg Oral Daily   donepezil  10 mg Oral QHS   enoxaparin (LOVENOX) injection  40 mg Subcutaneous Q24H   finasteride  5 mg Oral Daily   folic acid  1 mg Oral Daily   gabapentin  300 mg Oral QHS   iron polysaccharides  150 mg Oral Daily   magic mouthwash  10 mL Oral QID   metoprolol tartrate  12.5 mg Oral BID   mirabegron ER  50 mg Oral Daily   pantoprazole  40 mg Oral Daily   Vitamin D (Ergocalciferol)  50,000 Units Oral Q7 days   Continuous Infusions: PRN Meds: acetaminophen, phenol, polyethylene glycol, senna-docusate, traMADol  Time spent: 35 minutes  Author: Gillis Santa. MD [facility] 10/12/2022 4:33 PM  To reach On-call, see care teams to locate the attending and reach out to them via [email or web address]. If 7PM-7AM, please contact night-coverage If you still have difficulty reaching the attending provider, please page the [facility] (Director on Call) for [facility] on amion for assistance.

## 2022-10-12 NOTE — Care Management Important Message (Signed)
Important Message  Patient Details  Name: Adam Rios. MRN: [id number] Date of Birth: [birth year]   Medicare Important Message Given:  Yes     Adam Rios 10/12/2022, 11:50 AM

## 2022-10-12 NOTE — Plan of Care (Signed)
  Problem: Skin Integrity: Goal: Risk for impaired skin integrity will decrease Outcome: Progressing   Problem: Safety: Goal: Ability to remain free from injury will improve Outcome: Progressing   Problem: Pain Managment: Goal: General experience of comfort will improve Outcome: Progressing   Problem: Elimination: Goal: Will not experience complications related to bowel motility Outcome: Progressing   Problem: Coping: Goal: Level of anxiety will decrease Outcome: Progressing   Problem: Nutrition: Goal: Adequate nutrition will be maintained Outcome: Progressing   Problem: Clinical Measurements: Goal: Cardiovascular complication will be avoided Outcome: Progressing

## 2022-10-12 NOTE — Progress Notes (Signed)
Physical Therapy Treatment Patient Details Name: Adam Rios. MRN: [id number] DOB: January 30, 1941 Today's Date: 10/12/2022   History of Present Illness Adam Rios is an 82 y/o with a medical history significant of recurrent UTIs, cognitive impairment, history of CVA, essential hypertension, renal cell carcinoma, neurogenic bladder, chronic indwelling Foley catheter, some dementia, insomnia, chronic diastolic heart failure and history of right lower extremity cellulitis. He has had a PPM placed and 09/08/22 had a cardiomems implant via RHC to the pulmonary artery for assist in management of HFpEF. MD assessment includes: Severe sepsis and acute UTI secondary to chronic indwelling Foley catheter secondary to Proteus mirabilis infection, acute metabolic encephalopathy, AKI, and normocytic anemia.    PT Comments    Pt resting in bed upon PT arrival; agreeable to therapy.  Pt appearing teary intermittently during session d/t reporting he has been waiting all day for his daughter to call him.  During session pt SBA semi-supine to sitting edge of bed; mod assist to stand up to RW; and CGA to ambulate 5 feet x2 with RW use.  Upon standing, pt noted to be incontinent of BM so pt assisted with clean-up.  After walking 5 feet with RW pt noted with leaking of fluid on floor (nurse notified and reports leaking has been an issue despite foley catheter) so brief placed on pt.  After attempting walking again pt's phone rang and was his daughter so ambulation distance limited to 5 feet again d/t pt very emotional and wanting to talk to his daughter.  Pt set-up with all needs in reach and NT present with pt end of session; nurse updated on pt's session.   Recommendations for follow up therapy are one component of a multi-disciplinary discharge planning process, led by the attending physician.  Recommendations may be updated based on patient status, additional functional criteria and insurance authorization.         Assistance Recommended at Discharge Frequent or constant Supervision/Assistance  Patient can return home with the following A little help with walking and/or transfers;A lot of help with bathing/dressing/bathroom;Assistance with cooking/housework;Direct supervision/assist for medications management;Direct supervision/assist for financial management;Assist for transportation;Help with stairs or ramp for entrance   Equipment Recommendations  Rolling walker (2 wheels);BSC/3in1    Recommendations for Other Services       Precautions / Restrictions Precautions Precautions: Fall Restrictions Weight Bearing Restrictions: No     Mobility  Bed Mobility Overal bed mobility: Needs Assistance Bed Mobility: Supine to Sit     Supine to sit: Supervision, HOB elevated     General bed mobility comments: mild increased effort to perform on own; use of bed rail    Transfers Overall transfer level: Needs assistance Equipment used: Rolling walker (2 wheels) Transfers: Sit to/from Stand Sit to Stand: Mod assist           General transfer comment: x1 trial standing from bed and x1 trial standing from recliner; assist to initiate stand and come to full stand; assist to control descent sitting    Ambulation/Gait Ambulation/Gait assistance: Min guard Gait Distance (Feet):  (5 feet x2) Assistive device: Rolling walker (2 wheels) Gait Pattern/deviations: Trunk flexed, Decreased step length - right, Decreased step length - left Gait velocity: decreased     General Gait Details: vc's for upright posture; pt tending to walk with flexed posture and short shuffling steps   Stairs             Wheelchair Mobility    Modified Rankin (Stroke Patients Only)  Balance Overall balance assessment: Needs assistance Sitting-balance support: No upper extremity supported, Feet supported Sitting balance-Leahy Scale: Good Sitting balance - Comments: steady sitting reaching within BOS    Standing balance support: Bilateral upper extremity supported, During functional activity, Reliant on assistive device for balance Standing balance-Leahy Scale: Poor Standing balance comment: pt stabilizing in standing with B LE against bed or recliner (for static standing)                            Cognition Arousal/Alertness: Awake/alert Behavior During Therapy:  (Tearful (d/t reporting waiting for daughter to call all day)) Overall Cognitive Status: Within Functional Limits for tasks assessed                                          Exercises      General Comments  Nursing cleared pt for participation in physical therapy.  Pt agreeable to PT session.      Pertinent Vitals/Pain Pain Assessment Pain Assessment: Faces Faces Pain Scale: Hurts a little bit Pain Location: B LE's Pain Descriptors / Indicators: Aching, Sore Pain Intervention(s): Limited activity within patient's tolerance, Monitored during session, Repositioned    Home Living                          Prior Function            PT Goals (current goals can now be found in the care plan section) Acute Rehab PT Goals Patient Stated Goal: to improve strength and mobility PT Goal Formulation: With patient Time For Goal Achievement: 10/13/22 Potential to Achieve Goals: Good Progress towards PT goals: Progressing toward goals    Frequency    Min 3X/week      PT Plan Current plan remains appropriate    Co-evaluation              AM-PAC PT "6 Clicks" Mobility   Outcome Measure  Help needed turning from your back to your side while in a flat bed without using bedrails?: A Little Help needed moving from lying on your back to sitting on the side of a flat bed without using bedrails?: A Little Help needed moving to and from a bed to a chair (including a wheelchair)?: A Little Help needed standing up from a chair using your arms (e.g., wheelchair or bedside  chair)?: A Lot Help needed to walk in hospital room?: A Little Help needed climbing 3-5 steps with a railing? : A Lot 6 Click Score: 16    End of Session Equipment Utilized During Treatment: Gait belt Activity Tolerance: Patient tolerated treatment well Patient left: in chair;with call bell/phone within reach;with chair alarm set;with nursing/sitter in room Nurse Communication: Mobility status;Precautions PT Visit Diagnosis: Difficulty in walking, not elsewhere classified (R26.2);Muscle weakness (generalized) (M62.81)     Time: 1610-9604 PT Time Calculation (min) (ACUTE ONLY): 23 min  Charges:  $Therapeutic Activity: 23-37 mins                     Hendricks Limes, PT 10/12/22, 4:24 PM

## 2022-10-12 NOTE — TOC Progression Note (Signed)
Transition of Care (TOC) - Progression Note    Patient Details  Name: Adam Rios. MRN: [id number] Date of Birth: 31-May-1941  Transition of Care [facility]) CM/SW Contact  Allena Katz, LCSW Phone Number: 10/12/2022, 10:08 AM  Clinical Narrative:   Per Cammy Copa at [facility], daughter [name] has called and paid for her share of aide services for the patient. [name] still needs to sign paperwork for services to be completed. Per [employer], hospital bed and BSC not delivered yet but anticipating it may be today that they can deliver.          Expected Discharge Plan and Services                                               Social Determinants of Health (SDOH) Interventions SDOH Screenings   Food Insecurity: No Food Insecurity (09/29/2022)  Housing: Low Risk  (09/29/2022)  Transportation Needs: No Transportation Needs (09/29/2022)  Utilities: Not At Risk (09/29/2022)  Tobacco Use: Medium Risk (09/29/2022)    Readmission Risk Interventions    08/11/2022    4:53 PM 06/11/2022    2:19 PM 02/12/2022    2:17 PM  Readmission Risk Prevention Plan  Transportation Screening Complete Complete Complete  PCP or Specialist Appt within 3-5 Days  Complete Complete  HRI or Home Care Consult  Complete Complete  Social Work Consult for Recovery Care Planning/Counseling  Complete Complete  Palliative Care Screening  Not Applicable Not Applicable  Medication Review Oceanographer) Complete Complete Referral to Pharmacy  [facility] or Home Care Consult Complete    Skilled Nursing Facility Complete

## 2022-10-12 NOTE — Care Management Important Message (Signed)
Important Message  Patient Details  Name: Adam Rios. MRN: [id number] Date of Birth: 03-07-1941   Medicare Important Message Given:  Yes  Patient remains in an isolation room so I reviewed his Important Message from Medicare with him again by phone [phone]). He stated everyone has been very good to him but he is ready to get home. I thanked for his time and wished him a speedy recovery.   Olegario Messier A Imanuel Pruiett 10/12/2022, 11:50 AM

## 2022-10-13 LAB — CBC
HCT: 36.2 % — ABNORMAL LOW (ref 39.0–52.0)
Hemoglobin: 11.3 g/dL — ABNORMAL LOW (ref 13.0–17.0)
MCH: 26.7 pg (ref 26.0–34.0)
MCHC: 31.2 g/dL (ref 30.0–36.0)
MCV: 85.4 fL (ref 80.0–100.0)
Platelets: 246 10*3/uL (ref 150–400)
RBC: 4.24 MIL/uL (ref 4.22–5.81)
RDW: 15.8 % — ABNORMAL HIGH (ref 11.5–15.5)
WBC: 8 10*3/uL (ref 4.0–10.5)
nRBC: 0 % (ref 0.0–0.2)

## 2022-10-13 MED ORDER — POLYSACCHARIDE IRON COMPLEX 150 MG PO CAPS: 150.0000 mg | ORAL_CAPSULE | Freq: Every day | ORAL | 2 refills | Status: DC

## 2022-10-13 MED ORDER — VITAMIN D (ERGOCALCIFEROL) 1.25 MG (50000 UNIT) PO CAPS: 50000.0000 [IU] | ORAL_CAPSULE | ORAL | 0 refills | Status: AC

## 2022-10-13 MED ORDER — FOLIC ACID 1 MG PO TABS: 1.0000 mg | ORAL_TABLET | Freq: Every day | ORAL | 2 refills | Status: AC

## 2022-10-13 MED ORDER — PANTOPRAZOLE SODIUM 20 MG PO TBEC: 20.0000 mg | DELAYED_RELEASE_TABLET | Freq: Every day | ORAL | 2 refills | Status: DC

## 2022-10-13 NOTE — Progress Notes (Signed)
Received med order to discharg patient to home, reviewed discharge instructions, homes meds, prescriptions and follow up appointments with patient and patient verbalized understanding

## 2022-10-13 NOTE — TOC Transition Note (Addendum)
Transition of Care [facility]) - CM/SW Discharge Note   Patient Details  Name: Adam Rios. MRN: [id number] Date of Birth: 1941-01-04  Transition [facility] Care [facility]) CM/SW Contact:  Allena Katz, LCSW Phone Number: 10/13/2022, 1:59 PM   Clinical Narrative:   Pt discharging home. CSW spoke with daughter [name] who confirmed hospital bed delivery from [facility]. Daughter agreeable to pt being discharged today. CSW spoke with sarah from [facility] who stated they will be out within 48 hours of discharge for [facility] PT/OT/RN/AID.SW. Cammy Copa with [facility] states they will start aide services on patient this week and he is good to go from their end.  Daughter requesting EMS transport. Medical necessity printed to unit. [facility] to be arranged.           Patient Goals and CMS Choice      Discharge Placement                         Discharge Plan and Services Additional resources added to the After Visit Summary for                                       Social Determinants of Health (SDOH) Interventions SDOH Screenings   Food Insecurity: No Food Insecurity (09/29/2022)  Housing: Low Risk  (09/29/2022)  Transportation Needs: No Transportation Needs (09/29/2022)  Utilities: Not At Risk (09/29/2022)  Tobacco Use: Medium Risk (09/29/2022)     Readmission Risk Interventions    08/11/2022    4:53 PM 06/11/2022    2:19 PM 02/12/2022    2:17 PM  Readmission Risk Prevention Plan  Transportation Screening Complete Complete Complete  PCP or Specialist Appt within 3-5 Days  Complete Complete  HRI or Home Care Consult  Complete Complete  Social Work Consult for Recovery Care Planning/Counseling  Complete Complete  Palliative Care Screening  Not Applicable Not Applicable  Medication Review Oceanographer) Complete Complete Referral to Pharmacy  [facility] or Home Care Consult Complete    Skilled Nursing Facility Complete

## 2022-10-13 NOTE — Progress Notes (Signed)
Mobility Specialist - Progress Note   10/13/22 1000  Mobility  Activity Ambulated with assistance in hallway  Level of Assistance Standby assist, set-up cues, supervision of patient - no hands on  Assistive Device Front wheel walker  Distance Ambulated (ft) 180 ft  Activity Response Tolerated well  $Mobility charge 1 Mobility  Mobility Specialist Start Time (ACUTE ONLY) 0925  Mobility Specialist Stop Time (ACUTE ONLY) 1011  Mobility Specialist Time Calculation (min) (ACUTE ONLY) 46 min     During mobility: 83 HR, >95% SpO2   Pt sitting in recliner upon arrival, utilizing RA. Pt completed STS with modA, peri-hygiene completed upon standing. Post lean in initial standing, requiring extra time for balance. Pt ambulated in hallway (55' + 77' + 50') with minG. No LOB. Pt does endorse feeling SOB with activity this date, however sats maintained high 90s throughout session. VC for navigating around obstacles, as pt has forward flexed lean and occasional downward gaze. Pt returned to recliner with alarm set, needs in reach. Pt gets emotional about possible d/c today as Chartered loss adjuster prepares to exit, encouragement provided.    Adam Rios Mobility Specialist 10/13/22, 10:33 AM

## 2022-10-13 NOTE — Discharge Summary (Signed)
[facility] Discharge Summary   Patient: Adam Rios. ZOX:[id number]  PCP: Laqueta Due, MD  Date of admission: 09/28/2022   Date of discharge:  10/13/2022     Discharge Diagnoses:  Principal Problem:   Sepsis secondary to UTI [facility]) Active Problems:   Chronic diastolic CHF (congestive heart failure) (HCC)   Complicated UTI (urinary tract infection)   BPH with obstruction/lower urinary tract symptoms   AKI (acute kidney injury) (HCC)   Essential hypertension   GERD (gastroesophageal reflux disease)   Neurogenic bladder   Atrial fibrillation with RVR (HCC)   PAD (peripheral artery disease) (HCC)   History of stroke   Acute metabolic encephalopathy   Admitted From: Home Disposition:  Home with health services  Recommendations for Outpatient Follow-up:  Follow-up with PCP in 1 week, continue to monitor BP at home and follow with PCP directed medications accordingly.  Blood pressures running soft so decreased Toprol XL 12.5 mg p.o. daily. Follow-up with urology for further management as an outpatient. Follow-up with cardiology as per schedule Follow up LABS/TEST:     Diet recommendation: Cardiac diet  Activity: The patient is advised to gradually reintroduce usual activities, as tolerated  Discharge Condition: stable  Code Status: Full code   History of present illness: As per the H and P dictated on admission Hospital Course:  Adam Rios, an 82 y/o with PMH of HTN, CVA, anxiety RCC, neurogenic bladder with chronic indwelling Foley catheter presented to hospital with complaints of unresponsive episode at home. Reportedly patient was giving blank stare. Currently being treated for UTI.    Assessment and Plan: # Severe sepsis acute UTI secondary to chronic indwelling Foley catheter secondary to Proteus mirabilis infection. With tachycardia, tachypnea as well as acute kidney injury meeting criteria for severe sepsis at the time of admission with source of infection  is UTI. S/p IV ceftriaxone x 2 doses,  5/2--5/8 oral Keflex based on sensitivity.  Completed course Foley catheter changed on 4/30. Blood cultures so far negative.  Urine culture growing Proteus. Sepsis physiology resolved.  Patient was advised to follow-up with a urologist to exchange Foley catheter every 4 weeks.  # Acute metabolic encephalopathy, Resolved  # Unresponsive episode, Original reason for patient's presentation. Mentation improving.  No focal deficit.  Able to follow all commands.  Likely combination of AKI as well as sepsis currently dehydration. Patient does appear to have capacity to make medical decisions as of evaluation on 5/3.  Patient does not appear to be making right decisions as he desires to go home with his severe deconditioning. 5/8 consulted psychiatrist to eval patient for decision-making capacity and as per psych patient does have decision-making capacity, and we should respect his wishes. # AKI and Hypokalemia, Resolved. K is 4.0, Creatinine is at baseline at 1.00 # Concern for A-fib with RVR-ruled out. Frequent PVC unknown NSVT Reported to have A-fib at the time of admission. on the EKG there are P waves seen in the lead II. Currently no evidence of A-fib for now. Most likely sinus tachycardia in the setting of sepsis at the time of admission. Continue Toprol-XL. Echocardiogram performed shows preserved EF at 55-60% with possible infiltrative process such as amyloid/sarcoid. There is moderate concentric LVH. There are no LV regional wall motion abnormalities. Normal diastolic parameters. Patient has established discharge with [facility] cardiology and recommend to follow-up with them outpatient. # Chronic diastolic CHF, pt was on torsemide at home.  EF 55 to 60%. Currently euvolemic.  Has frequent visits  to [facility] urgent care clinic for IV diuresis. Torsemide was resumed but renal function worsened therefore on hold again.  Discontinued torsemide on discharge, patient was advised  to continue fluid restriction 1.5 L/day.  Follow with PCP and cardiology as an outpatient for further management if there is any lower extremity swelling or worsening of CHF. # Normocytic anemia: Hb 11.3, on admission hemoglobin 13.3.  So far remained stable, did not drop much.  Follow with PCP to repeat CBC after few weeks.  Continued oral iron supplement # Folic acid level 6.8, at lower end, started folic acid 1 mg p.o. daily.  Repeat folic acid level after 3 months # Vitamin D deficiency: started vitamin D 50,000 units p.o. weekly, follow with PCP to repeat vitamin D level after 3 to 6 months. # Dementia, On Aricept.  No behavioral issues. # Chronic neuropathy, continued gabapentin nightly home dose on discharge.  # HLD, Continuing statin. # History of CVA, on aspirin and Plavix. continued. 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 Patient appears to have history of chronic intermittent catheterization for 10+ years. After his HoLEP in January 2024 he was unable to perform intermittent self-catheterization and had constant urinary leakage. In February 2024 patient's primary urologist approved for placement of a chronic indwelling Foley catheter for incontinence. Patient appears to have recurrent UTIs since then including this admission. Discussed with urology, recommend to continue current management but recommend a voiding trial with urology outpatient since he has a Foley catheter for so long. Foley catheter already upped in size from 80 [place] to 16 [place] and continues to have leakage. Unable to perform urodynamic studies in the hospital. Outpatient follow-up with primary urologist recommended with Dr. Al Corpus. Continue Myrbetriq. 5/7 d/w urologist Dr Lonna Cobb, rec f/u with his primary urologist as an outpatient, no intervention. 5/8 discussed with another urologist Dr. Apolinar Junes who suggested to do bladder scan to rule out urinary  retention and kinking of Foley, continue Myrbetriq, no any other intervention.   # Social issues: Patient case was evaluated by APS. There have been concerns that he is unable to make decisions for  himself. By Dr. Eliane Decree evaluation on 10/02/2022.  Psych was consulted to determine the capacity, and made a decision that patient does have decision-making capacity.  Home health services were arranged, TOC help appreciated to discharge him home.  Patient agreed with the discharge planning.  Body mass index is 27.72 kg/m.  Nutrition Interventions:   Patient was seen by physical therapy, who recommended Home health, which was arranged. On the day of the discharge the patient's vitals were stable, and no other acute medical condition were reported by patient. the patient was felt safe to be discharge at Home with Home health.  Consultants: Psychiatrist Procedures: None  Discharge Exam: General: Appear in no distress, no Rash; Oral Mucosa Clear, moist. Cardiovascular: S1 and S2 Present, no Murmur, Respiratory: normal respiratory effort, Bilateral Air entry present and no Crackles, no wheezes Abdomen: Bowel Sound present, Soft and no tenderness, no hernia Extremities: no Pedal edema, no calf tenderness Neurology: alert and oriented to time, place, and person affect appropriate.  Filed Weights   10/09/22 0703 10/10/22 0500 10/11/22 0500  Weight: 95.1 kg 94.6 kg 95.3 kg   Vitals:   10/13/22 0539 10/13/22 0901  BP: 119/66 101/73  Pulse: 64 81  Resp: 18 17  Temp: 98.3 F (36.8 C) 97.6 F (36.4 C)  SpO2: 96% 99%  DISCHARGE MEDICATION: Allergies as of 10/13/2022       Reactions   Empagliflozin    Recurrent UTI with sepsis on jardiance.   Fentanyl Nausea Only   Oxycodone Nausea And Vomiting        Medication List     STOP taking these medications    omeprazole 20 MG capsule Commonly known as: PRILOSEC Replaced by: pantoprazole 20 MG tablet   potassium chloride SA 20 MEQ  tablet Commonly known as: KLOR-CON M   torsemide 20 MG tablet Commonly known as: DEMADEX       TAKE these medications    acetaminophen 325 MG tablet Commonly known as: TYLENOL Take 500 mg by mouth every 6 (six) hours as needed.   aspirin EC 81 MG tablet Take 81 mg by mouth daily as needed.   atorvastatin 40 MG tablet Commonly known as: LIPITOR Take 40 mg by mouth daily.   clopidogrel 75 MG tablet Commonly known as: PLAVIX Take 75 mg by mouth daily.   [employer] Cortisone Maximum Strength 1 % Generic drug: hydrocortisone cream Apply 1 Application topically as needed for itching.   cyanocobalamin 1000 MCG tablet Commonly known as: VITAMIN B12 Take 1,000 mcg by mouth daily.   cyclobenzaprine 5 MG tablet Commonly known as: FLEXERIL Take 5-10 mg by mouth 3 (three) times daily as needed for muscle spasms.   diclofenac Sodium 1 % Gel Commonly known as: VOLTAREN Apply 2 g topically 2 (two) times daily as needed.   donepezil 10 MG tablet Commonly known as: ARICEPT Take 10 mg by mouth at bedtime.   finasteride 5 MG tablet Commonly known as: PROSCAR Take 5 mg by mouth daily.   folic acid 1 MG tablet Commonly known as: FOLVITE Take 1 tablet (1 mg total) by mouth daily. Start taking on: Oct 14, 2022   gabapentin 300 MG capsule Commonly known as: NEURONTIN Take 1 capsule (300 mg total) by mouth at bedtime.   iron polysaccharides 150 MG capsule Commonly known as: NIFEREX Take 1 capsule (150 mg total) by mouth daily. Start taking on: Oct 14, 2022   metoprolol succinate 25 MG 24 hr tablet Commonly known as: TOPROL-XL Take 0.5 tablets (12.5 mg total) by mouth daily. What changed: how much to take   mirabegron ER 50 MG Tb24 tablet Commonly known as: MYRBETRIQ Take 1 tablet (50 mg total) by mouth daily.   pantoprazole 20 MG tablet Commonly known as: PROTONIX Take 1 tablet (20 mg total) by mouth daily. Start taking on: Oct 14, 2022 Replaces: omeprazole 20 MG capsule    polyethylene glycol 17 g packet Commonly known as: MIRALAX / GLYCOLAX Take 17 g by mouth daily as needed for severe constipation.   senna-docusate 8.6-50 MG tablet Commonly known as: Senokot-S Take 1 tablet by mouth 2 (two) times daily as needed.   Vitamin D (Ergocalciferol) 1.25 MG (50000 UNIT) Caps capsule Commonly known as: DRISDOL Take 1 capsule (50,000 Units total) by mouth every 7 (seven) days. Start taking on: Oct 20, 2022               Durable Medical Equipment  (From admission, onward)           Start     Ordered   10/08/22 0958  For home use only DME 3 n 1  Once        10/08/22 0958   10/08/22 0958  For home use only DME Hospital bed  Once       Comments: therapeutic  mattress  Question Answer Comment  Length of Need Lifetime   Bed type Semi-electric      10/08/22 0958           Allergies  Allergen Reactions   Empagliflozin     Recurrent UTI with sepsis on jardiance.   Fentanyl Nausea Only   Oxycodone Nausea And Vomiting   Discharge Instructions     Call MD for:  difficulty breathing, headache or visual disturbances   Complete by: As directed    Call MD for:  extreme fatigue   Complete by: As directed    Call MD for:  persistant dizziness or light-headedness   Complete by: As directed    Call MD for:  persistant nausea and vomiting   Complete by: As directed    Call MD for:  severe uncontrolled pain   Complete by: As directed    Call MD for:  temperature >100.4   Complete by: As directed    Diet - low sodium heart healthy   Complete by: As directed    Discharge instructions   Complete by: As directed    Follow-up with PCP in 1 week, continue to monitor BP at home and follow with PCP directed medications accordingly.  Blood pressures running soft so decreased Toprol XL 12.5 mg p.o. daily. Follow-up with urology for further management as an outpatient. Follow-up with cardiology as per schedule   Increase activity slowly   Complete by:  As directed    No wound care   Complete by: As directed        The results of significant diagnostics from this hospitalization (including imaging, microbiology, ancillary and laboratory) are listed below for reference.    Significant Diagnostic Studies: ECHOCARDIOGRAM LIMITED  Result Date: 10/01/2022    ECHOCARDIOGRAM LIMITED REPORT   Patient Name:   Kysir Chivers. Date of Exam: 09/30/2022 Medical Rec #:  [id number]          Height:       73.0 in Accession #:    [id number]         Weight:       223.0 lb Date of Birth:  04/15/1941           BSA:          2.254 m Patient Age:    82 years           BP:           108/61 mmHg Patient Gender: M                  HR:           119 bpm. Exam Location:  [facility] Procedure: 2D Echo, Limited Echo, Limited Color Doppler and Cardiac Doppler Indications:     I47.2 Ventricular Tachycardia.  History:         Patient has prior history of Echocardiogram examinations, most                  recent 04/30/2022. Stroke; Risk Factors:Hypertension.  Sonographer:     Daphine Deutscher RDCS Referring Phys:  [id number] Zachery Conch PATEL Diagnosing Phys: Alwyn Pea MD IMPRESSIONS  1. Consider infiltrative process ie..Amyloid/Sarcoid.  2. Left ventricular ejection fraction, by estimation, is 55 to 60%. The left ventricle has normal function. The left ventricle has no regional wall motion abnormalities. There is moderate concentric left ventricular hypertrophy. Left ventricular diastolic parameters were normal.  3. Right ventricular systolic function is normal. The right  ventricular size is normal.  4. Left atrial size was moderately dilated.  5. Right atrial size was mild to moderately dilated.  6. The mitral valve is normal in structure. Trivial mitral valve regurgitation.  7. The aortic valve is grossly normal. Aortic valve regurgitation is trivial. Aortic valve sclerosis is present, with no evidence of aortic valve stenosis. Conclusion(s)/Recommendation(s): Poor windows for  evaluation of left ventricular function by transthoracic echocardiography. Would recommend an alternative means of evaluation. FINDINGS  Left Ventricle: Left ventricular ejection fraction, by estimation, is 55 to 60%. The left ventricle has normal function. The left ventricle has no regional wall motion abnormalities. There is moderate concentric left ventricular hypertrophy. Left ventricular diastolic parameters were normal. Right Ventricle: The right ventricular size is normal. No increase in right ventricular wall thickness. Right ventricular systolic function is normal. Left Atrium: Left atrial size was moderately dilated. Right Atrium: Right atrial size was mild to moderately dilated. Pericardium: There is no evidence of pericardial effusion. Mitral Valve: The mitral valve is normal in structure. Trivial mitral valve regurgitation. Tricuspid Valve: The tricuspid valve is normal in structure. Tricuspid valve regurgitation is mild. Aortic Valve: The aortic valve is grossly normal. Aortic valve regurgitation is trivial. Aortic regurgitation PHT measures 386 msec. Aortic valve sclerosis is present, with no evidence of aortic valve stenosis. Pulmonic Valve: The pulmonic valve was normal in structure. Pulmonic valve regurgitation is not visualized. Aorta: The ascending aorta was not well visualized. Additional Comments: Consider infiltrative process ie..Amyloid/Sarcoid.  LEFT VENTRICLE PLAX 2D LVIDd:         4.30 cm Diastology LVIDs:         3.00 cm LV e' medial:    5.00 cm/s LV PW:         1.60 cm LV E/e' medial:  19.0 LV IVS:        1.60 cm LV e' lateral:   8.70 cm/s                        LV E/e' lateral: 10.9  LEFT ATRIUM         Index LA diam:    5.00 cm 2.22 cm/m  AORTIC VALVE AI PHT:      386 msec  AORTA Ao Root diam: 4.30 cm MITRAL VALVE MV Area (PHT): 3.81 cm MV Decel Time: 199 msec MV E velocity: 95.20 cm/s MV A velocity: 88.55 cm/s MV E/A ratio:  1.08 Dwayne Salome Arnt MD Electronically signed by Alwyn Pea MD Signature Date/Time: 10/01/2022/7:41:56 AM    Final    DG Chest Port 1 View  Result Date: 09/29/2022 CLINICAL DATA:  Sepsis EXAM: PORTABLE CHEST 1 VIEW COMPARISON:  08/23/2022 FINDINGS: Single frontal view of the chest demonstrates stable dual lead pacemaker. The cardiac silhouette is unremarkable. Elevated right hemidiaphragm. No airspace disease, effusion, or pneumothorax. No acute bony abnormality. IMPRESSION: 1. No acute intrathoracic process. Electronically Signed   By: Sharlet Salina M.D.   On: 09/29/2022 08:59   CT Head Wo Contrast  Result Date: 09/28/2022 CLINICAL DATA:  Mental status change, unknown cause EXAM: CT HEAD WITHOUT CONTRAST TECHNIQUE: Contiguous axial images were obtained from the base of the skull through the vertex without intravenous contrast. RADIATION DOSE REDUCTION: This exam was performed according to the departmental dose-optimization program which includes automated exposure control, adjustment of the mA and/or kV according to patient size and/or use of iterative reconstruction technique. COMPARISON:  CT head 08/23/2022 FINDINGS: Brain: Stable prominence of  the lateral ventricles may be related to central predominant atrophy, although a component of normal pressure/communicating hydrocephalus cannot be excluded. Patchy and confluent areas of decreased attenuation are noted throughout the deep and periventricular white matter of the cerebral hemispheres bilaterally, compatible with chronic microvascular ischemic disease. No evidence of large-territorial acute infarction. No parenchymal hemorrhage. No mass lesion. No extra-axial collection. No mass effect or midline shift. No hydrocephalus. Basilar cisterns are patent. Vascular: No hyperdense vessel. Atherosclerotic calcifications are present within the cavernous internal carotid and vertebral arteries. Skull: No acute fracture or focal lesion. Sinuses/Orbits: Paranasal sinuses and mastoid air cells are clear.  Bilateral lens replacement. Otherwise the orbits are unremarkable. Other: None. IMPRESSION: 1. No acute intracranial abnormality. 2. Stable prominence of the lateral ventricles may be related to central predominant atrophy, although a component of normal pressure/communicating hydrocephalus cannot be excluded. Electronically Signed   By: Tish Frederickson M.D.   On: 09/28/2022 23:29    Microbiology: No results found for this or any previous visit (from the past 240 hour(s)).   Labs: CBC: Recent Labs  Lab 10/09/22 0459 10/13/22 0529  WBC 7.5 8.0  HGB 10.7* 11.3*  HCT 34.5* 36.2*  MCV 85.2 85.4  PLT 223 246   Basic Metabolic Panel: Recent Labs  Lab 10/09/22 0459 10/12/22 0438  NA 137  --   K 4.0  --   CL 107  --   CO2 22  --   GLUCOSE 86  --   BUN 13  --   CREATININE 0.97 0.99  CALCIUM 8.2*  --   MG 2.2  --   PHOS 2.8  --    Liver Function Tests: No results for input(s): "AST", "ALT", "ALKPHOS", "BILITOT", "PROT", "ALBUMIN" in the last 168 hours. No results for input(s): "LIPASE", "AMYLASE" in the last 168 hours. No results for input(s): "AMMONIA" in the last 168 hours. Cardiac Enzymes: No results for input(s): "CKTOTAL", "CKMB", "CKMBINDEX", "TROPONINI" in the last 168 hours. BNP (last 3 results) Recent Labs    04/29/22 1642 08/24/22 1223 09/28/22 2130  BNP 119.6* 193.7* 381.8*   CBG: No results for input(s): "GLUCAP" in the last 168 hours.  Time spent: 35 minutes  Signed:  Gillis Santa  [facility] 10/13/2022 2:13 PM

## 2022-10-31 ENCOUNTER — Other Ambulatory Visit: Payer: Self-pay

## 2022-10-31 ENCOUNTER — Emergency Department
Admission: EM | Admit: 2022-10-31 | Discharge: 2022-11-01 | Disposition: A | Discharge status: Home / Self Care | Payer: Medicare Other | Attending: Emergency Medicine | Admitting: Emergency Medicine

## 2022-10-31 DIAGNOSIS — X58XXXA Exposure to other specified factors, initial encounter: Secondary | ICD-10-CM | POA: Diagnosis not present

## 2022-10-31 DIAGNOSIS — S3730XA Unspecified injury of urethra, initial encounter: Secondary | ICD-10-CM | POA: Insufficient documentation

## 2022-10-31 DIAGNOSIS — N3001 Acute cystitis with hematuria: Secondary | ICD-10-CM | POA: Diagnosis not present

## 2022-10-31 DIAGNOSIS — S91114A Laceration without foreign body of right lesser toe(s) without damage to nail, initial encounter: Secondary | ICD-10-CM | POA: Diagnosis present

## 2022-10-31 DIAGNOSIS — E876 Hypokalemia: Secondary | ICD-10-CM | POA: Insufficient documentation

## 2022-10-31 DIAGNOSIS — W07XXXA Fall from chair, initial encounter: Secondary | ICD-10-CM | POA: Insufficient documentation

## 2022-10-31 DIAGNOSIS — W19XXXA Unspecified fall, initial encounter: Secondary | ICD-10-CM

## 2022-10-31 LAB — URINALYSIS, ROUTINE W REFLEX MICROSCOPIC: Specific Gravity, Urine: 1.03 (ref 1.005–1.030)

## 2022-10-31 LAB — COMPREHENSIVE METABOLIC PANEL
ALT: 21 U/L (ref 0–44)
AST: 43 U/L — ABNORMAL HIGH (ref 15–41)
Albumin: 3.2 g/dL — ABNORMAL LOW (ref 3.5–5.0)
Alkaline Phosphatase: 73 U/L (ref 38–126)
Anion gap: 12 (ref 5–15)
BUN: 18 mg/dL (ref 8–23)
CO2: 20 mmol/L — ABNORMAL LOW (ref 22–32)
Calcium: 8.4 mg/dL — ABNORMAL LOW (ref 8.9–10.3)
Chloride: 104 mmol/L (ref 98–111)
Creatinine, Ser: 1.26 mg/dL — ABNORMAL HIGH (ref 0.61–1.24)
GFR, Estimated: 57 mL/min — ABNORMAL LOW (ref >60)
Glucose, Bld: 108 mg/dL — ABNORMAL HIGH (ref 70–99)
Potassium: 2.9 mmol/L — ABNORMAL LOW (ref 3.5–5.1)
Sodium: 136 mmol/L (ref 135–145)
Total Bilirubin: 1.1 mg/dL (ref 0.3–1.2)
Total Protein: 6.7 g/dL (ref 6.5–8.1)

## 2022-10-31 LAB — CBC WITH DIFFERENTIAL/PLATELET
Abs Immature Granulocytes: 0.07 10*3/uL (ref 0.00–0.07)
Basophils Absolute: 0 10*3/uL (ref 0.0–0.1)
Basophils Relative: 0 %
Eosinophils Absolute: 0 10*3/uL (ref 0.0–0.5)
Eosinophils Relative: 0 %
HCT: 37.5 % — ABNORMAL LOW (ref 39.0–52.0)
Hemoglobin: 11.9 g/dL — ABNORMAL LOW (ref 13.0–17.0)
Immature Granulocytes: 1 %
Lymphocytes Relative: 9 %
Lymphs Abs: 1.3 10*3/uL (ref 0.7–4.0)
MCH: 26.8 pg (ref 26.0–34.0)
MCHC: 31.7 g/dL (ref 30.0–36.0)
MCV: 84.5 fL (ref 80.0–100.0)
Monocytes Absolute: 1 10*3/uL (ref 0.1–1.0)
Monocytes Relative: 7 %
Neutro Abs: 12.4 10*3/uL — ABNORMAL HIGH (ref 1.7–7.7)
Neutrophils Relative %: 83 %
Platelets: 182 10*3/uL (ref 150–400)
RBC: 4.44 MIL/uL (ref 4.22–5.81)
RDW: 15.1 % (ref 11.5–15.5)
WBC: 14.8 10*3/uL — ABNORMAL HIGH (ref 4.0–10.5)
nRBC: 0 % (ref 0.0–0.2)

## 2022-10-31 MED ORDER — LIDOCAINE-EPINEPHRINE-TETRACAINE (LET) TOPICAL GEL
3.0000 mL | Freq: Once | TOPICAL | Status: AC
  Administered 2022-10-31: 3 mL via TOPICAL
  Filled 2022-10-31 (×2): qty 3

## 2022-10-31 NOTE — ED Triage Notes (Signed)
Pt presents to ER from home with family member with c/o fall, and right pinky toe lac that happened this aftermoon.  Pt states he was trying to sit in his chair, when he put his elbow on a remote, that caused him to slide down the chair onto the floor.  Pt has indwelling foley in place on arrival.  Pt states when he fell, he believed he may have causes some urethral trauma, as he started to see some frank blood in the catheter bag.  Foley is still draining, but is extremely bloody in appearance. Pt is otherwise A&O x4 and in NAD in triage.

## 2022-10-31 NOTE — ED Provider Notes (Signed)
[facility] Provider Note   Event Date/Time   First MD Initiated Contact with Patient 10/31/22 2151     (approximate) History  Extremity Laceration and Fall  HPI Adam Rios. is a 82 y.o. male with a past medical history of chronic indwelling Foley catheter who presents after a mechanical fall from his lift chair.  Patient states that he was attempting to transfer from his lift chair to the couch when he slipped on a remote and suffered trauma to the right fifth toe including a laceration and significant pain.  Patient is also concerned as he has continued to have hematuria into his Foley catheter after being seen yesterday in clinic and treated for a urinary tract infection as a presumed source of this hematuria. ROS: Patient currently denies any vision changes, tinnitus, difficulty speaking, facial droop, sore throat, chest pain, shortness of breath, abdominal pain, nausea/vomiting/diarrhea, dysuria, or weakness/numbness/paresthesias in any extremity   Physical Exam  Triage Vital Signs: ED Triage Vitals  Enc Vitals Group     BP 10/31/22 2145 (!) 100/44     Pulse Rate 10/31/22 2145 (!) 125     Resp 10/31/22 2145 19     Temp 10/31/22 2145 98 F (36.7 C)     Temp Source 10/31/22 2145 Oral     SpO2 10/31/22 2145 96 %     Weight 10/31/22 2146 223 lb (101.2 kg)     Height --      Head Circumference --      Peak Flow --      Pain Score 10/31/22 2146 0     Pain Loc --      Pain Edu? --      Excl. in GC? --    Most recent vital signs: Vitals:   10/31/22 2145  BP: (!) 100/44  Pulse: (!) 125  Resp: 19  Temp: 98 F (36.7 C)  SpO2: 96%   General: Awake, oriented x4. CV:  Good peripheral perfusion.  Resp:  Normal effort.  Abd:  No distention.  Other:  Overweight elderly Caucasian male laying in bed in no acute distress.  Right fifth toe with a 1 cm linear laceration to the ventral aspect ED Results / Procedures / Treatments  Labs (all labs ordered  are listed, but only abnormal results are displayed) Labs Reviewed  CBC WITH DIFFERENTIAL/PLATELET  COMPREHENSIVE METABOLIC PANEL  URINALYSIS, ROUTINE W REFLEX MICROSCOPIC   PROCEDURES: Critical Care performed: No Procedures MEDICATIONS ORDERED IN ED: Medications  lidocaine-EPINEPHrine-tetracaine (LET) topical gel (3 mLs Topical Given 10/31/22 2226)   IMPRESSION / MDM / ASSESSMENT AND PLAN / ED COURSE  I reviewed the triage vital signs and the nursing notes.                             The patient is on the cardiac monitor to evaluate for evidence of arrhythmia and/or significant heart rate changes. Patient's presentation is most consistent with acute presentation with potential threat to life or bodily function. Presenting after a fall that occurred just prior to arrival, resulting in injury to the right pinky toe. The mechanism of injury was a mechanical ground level fall without syncope or near-syncope. The current level of pain is moderate. There was no loss of consciousness, confusion, seizure, or memory impairment. There is a laceration associated with the injury. Denies neck pain. The patient does not take blood thinner medications. Denies vomiting, numbness/weakness, fever  Dispo: Discharge with PCP follow-up       FINAL CLINICAL IMPRESSION(S) / ED DIAGNOSES   Final diagnoses:  Fall in home, initial encounter  Laceration of lesser toe of right foot without foreign body present or damage to nail, initial encounter   Rx / DC Orders   ED Discharge Orders     None      Note:  This document was prepared using Dragon voice recognition software and may include unintentional dictation errors.

## 2022-11-01 ENCOUNTER — Emergency Department: Payer: Medicare Other

## 2022-11-01 ENCOUNTER — Other Ambulatory Visit: Payer: Self-pay

## 2022-11-01 ENCOUNTER — Emergency Department
Admission: EM | Admit: 2022-11-01 | Discharge: 2022-11-01 | Disposition: A | Discharge status: Home / Self Care | Payer: Medicare Other | Source: Home / Self Care | Attending: Emergency Medicine | Admitting: Emergency Medicine

## 2022-11-01 DIAGNOSIS — X58XXXA Exposure to other specified factors, initial encounter: Secondary | ICD-10-CM | POA: Insufficient documentation

## 2022-11-01 DIAGNOSIS — S3730XA Unspecified injury of urethra, initial encounter: Secondary | ICD-10-CM | POA: Insufficient documentation

## 2022-11-01 DIAGNOSIS — E876 Hypokalemia: Secondary | ICD-10-CM | POA: Insufficient documentation

## 2022-11-01 DIAGNOSIS — N3001 Acute cystitis with hematuria: Secondary | ICD-10-CM | POA: Insufficient documentation

## 2022-11-01 LAB — URINALYSIS, ROUTINE W REFLEX MICROSCOPIC
Bilirubin Urine: NEGATIVE
Glucose, UA: NEGATIVE mg/dL
Ketones, ur: 5 mg/dL — AB
Nitrite: POSITIVE — AB
Protein, ur: 30 mg/dL — AB
RBC / HPF: >50 RBC/hpf (ref 0–5)
RBC / HPF: >50 RBC/hpf (ref 0–5)
Specific Gravity, Urine: 1.023 (ref 1.005–1.030)
Specific Gravity, Urine: 1.023 (ref 1.005–1.030)
Squamous Epithelial / HPF: NONE SEEN /HPF (ref 0–5)
Squamous Epithelial / HPF: NONE SEEN /HPF (ref 0–5)
WBC, UA: >50 WBC/hpf (ref 0–5)
WBC, UA: >50 WBC/hpf (ref 0–5)
pH: 5 (ref 5.0–8.0)

## 2022-11-01 LAB — CBC WITH DIFFERENTIAL/PLATELET
Abs Immature Granulocytes: 0.05 K/uL (ref 0.00–0.07)
Basophils Absolute: 0 K/uL (ref 0.0–0.1)
Basophils Relative: 0 %
Eosinophils Absolute: 0 K/uL (ref 0.0–0.5)
Eosinophils Relative: 0 %
HCT: 36.1 % — ABNORMAL LOW (ref 39.0–52.0)
Hemoglobin: 11.3 g/dL — ABNORMAL LOW (ref 13.0–17.0)
Immature Granulocytes: 0 %
Lymphocytes Relative: 10 %
Lymphs Abs: 1.3 K/uL (ref 0.7–4.0)
MCH: 26.7 pg (ref 26.0–34.0)
MCHC: 31.3 g/dL (ref 30.0–36.0)
MCV: 85.1 fL (ref 80.0–100.0)
Monocytes Absolute: 0.7 K/uL (ref 0.1–1.0)
Monocytes Relative: 6 %
Neutro Abs: 10.5 K/uL — ABNORMAL HIGH (ref 1.7–7.7)
Neutrophils Relative %: 84 %
Platelets: 175 K/uL (ref 150–400)
RBC: 4.24 MIL/uL (ref 4.22–5.81)
RDW: 15 % (ref 11.5–15.5)
WBC: 12.5 K/uL — ABNORMAL HIGH (ref 4.0–10.5)
nRBC: 0 % (ref 0.0–0.2)

## 2022-11-01 LAB — COMPREHENSIVE METABOLIC PANEL WITH GFR
ALT: 22 U/L (ref 0–44)
AST: 42 U/L — ABNORMAL HIGH (ref 15–41)
Albumin: 3 g/dL — ABNORMAL LOW (ref 3.5–5.0)
Alkaline Phosphatase: 72 U/L (ref 38–126)
Anion gap: 8 (ref 5–15)
BUN: 18 mg/dL (ref 8–23)
CO2: 27 mmol/L (ref 22–32)
Calcium: 8 mg/dL — ABNORMAL LOW (ref 8.9–10.3)
Chloride: 103 mmol/L (ref 98–111)
Creatinine, Ser: 1.11 mg/dL (ref 0.61–1.24)
GFR, Estimated: >60 mL/min
Glucose, Bld: 127 mg/dL — ABNORMAL HIGH (ref 70–99)
Potassium: 2.8 mmol/L — ABNORMAL LOW (ref 3.5–5.1)
Sodium: 138 mmol/L (ref 135–145)
Total Bilirubin: 1.1 mg/dL (ref 0.3–1.2)
Total Protein: 6.5 g/dL (ref 6.5–8.1)

## 2022-11-01 LAB — MAGNESIUM: Magnesium: 1.6 mg/dL — ABNORMAL LOW (ref 1.7–2.4)

## 2022-11-01 MED ORDER — ACETAMINOPHEN 500 MG PO TABS
1000.0000 mg | ORAL_TABLET | Freq: Once | ORAL | Status: AC
  Administered 2022-11-01: 1000 mg via ORAL
  Filled 2022-11-01: qty 2

## 2022-11-01 MED ORDER — POTASSIUM CHLORIDE CRYS ER 20 MEQ PO TBCR
40.0000 meq | EXTENDED_RELEASE_TABLET | Freq: Once | ORAL | Status: AC
  Administered 2022-11-01: 40 meq via ORAL
  Filled 2022-11-01: qty 2

## 2022-11-01 MED ORDER — POTASSIUM CHLORIDE 10 MEQ/100ML IV SOLN
10.0000 meq | INTRAVENOUS | Status: AC
  Administered 2022-11-01 (×2): 10 meq via INTRAVENOUS
  Filled 2022-11-01 (×2): qty 100

## 2022-11-01 MED ORDER — SODIUM CHLORIDE 0.9 % IV SOLN
2.0000 g | Freq: Once | INTRAVENOUS | Status: AC
  Administered 2022-11-01: 2 g via INTRAVENOUS
  Filled 2022-11-01: qty 20

## 2022-11-01 MED ORDER — POTASSIUM CHLORIDE CRYS ER 20 MEQ PO TBCR: 20.0000 meq | EXTENDED_RELEASE_TABLET | Freq: Two times a day (BID) | ORAL | 0 refills | Status: DC

## 2022-11-01 MED ORDER — MAGNESIUM SULFATE 2 GM/50ML IV SOLN
2.0000 g | Freq: Once | INTRAVENOUS | Status: AC
  Administered 2022-11-01: 2 g via INTRAVENOUS
  Filled 2022-11-01: qty 50

## 2022-11-01 MED ORDER — CEFDINIR 300 MG PO CAPS: 300.0000 mg | ORAL_CAPSULE | Freq: Two times a day (BID) | ORAL | 0 refills | Status: AC

## 2022-11-01 NOTE — Discharge Instructions (Addendum)
Your bleeding was most likely related to the Foley catheter having slipped out and gone into your penis which probably caused an injury to your urethra and cause some of the bleeding.  We have replaced the Foley and placed a connection on it so it is more on your leg and does not slip out again.  I have also changed antibiotics and recommend you stop taking the ciprofloxacin and start taking the cefdinir given your history of resistance to Cipro.  I am unable to see the urine culture from [facility] to know for sure if it would be resistant this time but I have sent new urine cultures today.  Your potassium and magnesium were also low and I have started you on some supplementation for these.  This needs to be rechecked by your primary care doctor in a few days and return to the ER if develop worsening symptoms or any other concerns, fevers, confusion   IMPRESSION: Malpositioned Foley catheter noted, with balloon now in penile urethra.   No evidence of urolithiasis, hydronephrosis, or other acute findings.   Prior left nephrectomy.

## 2022-11-01 NOTE — ED Triage Notes (Signed)
Pt reports was treated for UTI and took his meds but now has blood in his urine and wants to be checked out for that.

## 2022-11-01 NOTE — ED Provider Notes (Signed)
[facility] Provider Note    Event Date/Time   First MD Initiated Contact with Patient 11/01/22 1542     (approximate)   History   Hematuria   HPI  Adam Rios. is a 82 y.o. male who comes in with concern for blood in his urine.  Patient reports that he is currently on antibiotics.  I reviewed the note from 5/31 where patient was started on Cipro due to concerns for UTI.  However patient was recently admitted for Proteus UTI with sepsis and confusion back in April and the urine culture was resistant to Cipro and a Macrobid.  They also did not replace patient's Foley and it has been over a month and it was supposed to be replaced every month.  He reports that even though he is been taking the antibiotic he still having pain in his penis as well as hematuria.  He denies any significant abdominal discomfort.   Physical Exam   Triage Vital Signs: ED Triage Vitals [11/01/22 1141]  Enc Vitals Group     BP 137/64     Pulse Rate 82     Resp 20     Temp 97.8 F (36.6 C)     Temp Source Oral     SpO2 95 %     Weight 222 lb 10.6 oz (101 kg)     Height 6\' 1"  (1.854 m)     Head Circumference      Peak Flow      Pain Score 0     Pain Loc      Pain Edu?      Excl. in GC?     Most recent vital signs: Vitals:   11/01/22 1141 11/01/22 1353  BP: 137/64 98/60  Pulse: 82 64  Resp: 20 18  Temp: 97.8 F (36.6 C) 98.3 F (36.8 C)  SpO2: 95% 96%     General: Awake, no distress.  CV:  Good peripheral perfusion.  Resp:  Normal effort.  Abd:  No distention.  Other:  No testicle pain.  No swelling of the testicles.  He reports pain in his penis.  He had a Foley in place but no attachment to the side of his leg it.   ED Results / Procedures / Treatments   Labs (all labs ordered are listed, but only abnormal results are displayed) Labs Reviewed  CBC WITH DIFFERENTIAL/PLATELET - Abnormal; Notable for the following components:      Result Value   WBC  12.5 (*)    Hemoglobin 11.3 (*)    HCT 36.1 (*)    Neutro Abs 10.5 (*)    All other components within normal limits  COMPREHENSIVE METABOLIC PANEL - Abnormal; Notable for the following components:   Potassium 2.8 (*)    Glucose, Bld 127 (*)    Calcium 8.0 (*)    Albumin 3.0 (*)    AST 42 (*)    All other components within normal limits  URINALYSIS, ROUTINE W REFLEX MICROSCOPIC - Abnormal; Notable for the following components:   Color, Urine GROSSLY BLOODY (*)    APPearance TURBID (*)    Glucose, UA   (*)    Value: TEST NOT REPORTED DUE TO COLOR INTERFERENCE OF URINE PIGMENT   Hgb urine dipstick   (*)    Value: TEST NOT REPORTED DUE TO COLOR INTERFERENCE OF URINE PIGMENT   Bilirubin Urine   (*)    Value: TEST NOT REPORTED DUE TO COLOR  INTERFERENCE OF URINE PIGMENT   Ketones, ur   (*)    Value: TEST NOT REPORTED DUE TO COLOR INTERFERENCE OF URINE PIGMENT   Protein, ur   (*)    Value: TEST NOT REPORTED DUE TO COLOR INTERFERENCE OF URINE PIGMENT   Nitrite   (*)    Value: TEST NOT REPORTED DUE TO COLOR INTERFERENCE OF URINE PIGMENT   Leukocytes,Ua   (*)    Value: TEST NOT REPORTED DUE TO COLOR INTERFERENCE OF URINE PIGMENT   Bacteria, UA MANY (*)    All other components within normal limits  MAGNESIUM     EKG  My interpretation of EKG:  Normal sinus rate of 64 without any ST elevation or T wave inversions, normal intervals  RADIOLOGY I have reviewed the CT personally interpreted no evidence of any kidney stones  PROCEDURES:  Critical Care performed: No  Procedures   MEDICATIONS ORDERED IN ED: Medications  magnesium sulfate IVPB 2 g 50 mL (has no administration in time range)  potassium chloride 10 mEq in 100 mL IVPB (has no administration in time range)  cefTRIAXone (ROCEPHIN) 2 g in sodium chloride 0.9 % 100 mL IVPB (has no administration in time range)  potassium chloride SA (KLOR-CON M) CR tablet 40 mEq (40 mEq Oral Given 11/01/22 1632)     IMPRESSION / MDM /  ASSESSMENT AND PLAN / ED COURSE  I reviewed the triage vital signs and the nursing notes.   Patient's presentation is most consistent with acute presentation with potential threat to life or bodily function.   Patient comes in with concern for continued penile pain and blood in his urine.  CT imaging was done to evaluate for kidney stone given his continued bleeding and pain.  CT imaging shows malpositioned Foley catheter with balloon in the penile urethra therefore will remove this and place a new Foley catheter this could have been what is causing some of the pain and bleeding.  Will send off a new urine.  It is also still possible that this could still be related to a UTI and the antibiotic that was previously picked was previously resistant although not able to see the urine culture from [facility] of most recent recent.  I will send a new urine culture off of the new catheter and give a dose of ceftriaxone and probably switch patient to cefdinir given his prior UTIs have been sensitive.  He does have a history of sepsis and his white count was elevated and is slightly downtrending is not currently septic today that I feel like he requires admission.  He does have an incidentally noted low potassium and magnesium will give some repletion here and have him follow-up for recheck with his primary care doctor. EKG without changes  Discussed with Dr Pete Glatter -removal of the Foley given I wanted to get out given concern for potential UTI and history of septic from UTIs with elevated white count there was a little bit of hematuria noted out of the penis I suspect a urethral injury secondary to the ball but having been inflated into the urethra.  He stated that it was okay to reinsert Foley as long as there it was easy to pass and had good urine output afterwards.  He can follow-up outpatient with his urologist at [facility].  After replacement of the Foley patient on repeat evaluation no longer had any significant  bleeding noted from the urethra.  His urine was mostly coming out clear with a little bit  of blood tinge but much improved from previous therefore I suspect that some of this hematuria was all related to the Foley being in the wrong spot. No retention- doesnot nheed bladder irrigation- urine in the bladder clear-  However just to be safe given his history of sepsis we will switch him from ciprofloxacin to cefdinir especially given the trauma to the urethra and he can follow-up outpatient with his PCP.  Patient expressed understanding and felt comfortable with discharge home  Considered admission given hypokalemia but patient is tolerating p.o. and medications were given in the ER to help correct this and he can follow-up outpatient given asymptomatic without EKG changes    FINAL CLINICAL IMPRESSION(S) / ED DIAGNOSES   Final diagnoses:  Hypomagnesemia  Hypokalemia  Injury of urethra, initial encounter  Acute cystitis with hematuria     Rx / DC Orders   ED Discharge Orders     None        Note:  This document was prepared using Dragon voice recognition software and may include unintentional dictation errors.   Concha Se, MD 11/01/22 [phone]

## 2022-11-01 NOTE — ED Notes (Signed)
Called [facility] for transport home 

## 2022-11-01 NOTE — ED Notes (Signed)
Call;ed and cancelled [facility] transport per Gerilyn Pilgrim, RN

## 2022-11-01 NOTE — ED Triage Notes (Signed)
Pt in via EMS from home with c/o re-evaluation for uti. Pt has catheter in place with red urine. #20 g to right AC. Pt was treated for a UTI on Friday and wants to have it checked again. HR 70, paced rhythm 136/77, 98.0 temp, CBG 159 Pt was seen here yesterday for laceration.

## 2022-11-02 LAB — URINE CULTURE

## 2022-11-02 IMAGING — CT CT ABD-PELV W/ CM
2 of 5 series · 15 of 46 positions shown, 17 images · IV contrast (APPLIED)
Comparison: 09/07/2021

CLINICAL DATA: Pain left lower quadrant of abdomen

EXAM:
CT ABDOMEN AND PELVIS WITH CONTRAST
TECHNIQUE: Multidetector CT imaging of the abdomen and pelvis was performed
using the standard protocol following bolus administration of
intravenous contrast.

[Series 3: axial st · axial · 0.88mm/px · z∈[-345,+125]mm · 12 of 112 slices shown, 14 images]
[im 9/112  soft-tissue]
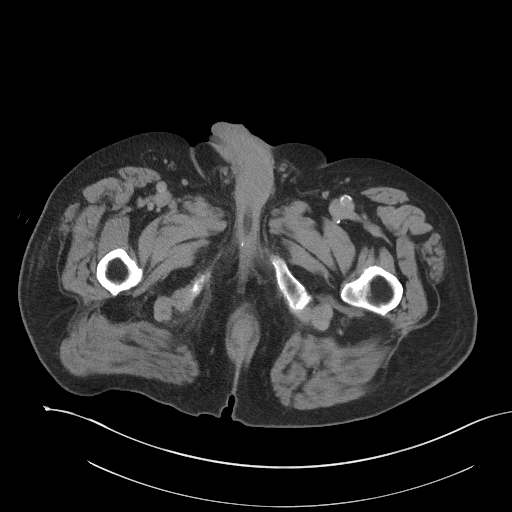
[im 9/112  bone]
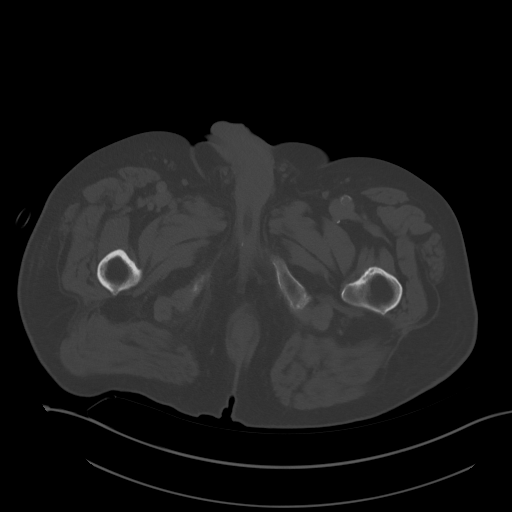
[im 18/112  soft-tissue]
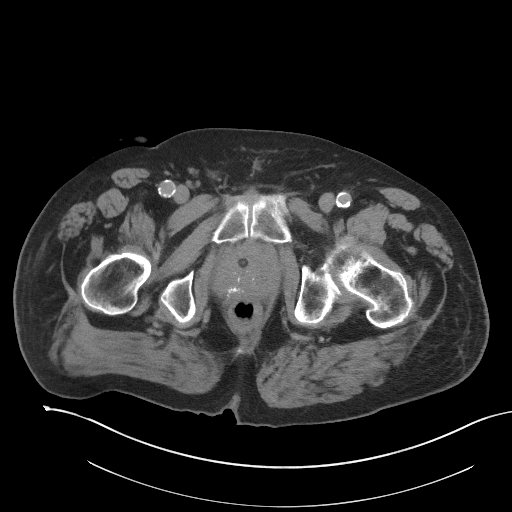
[im 26/112  soft-tissue]
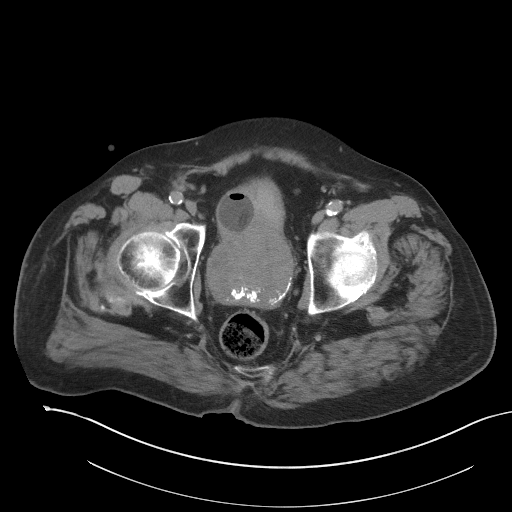
[im 35/112  soft-tissue]
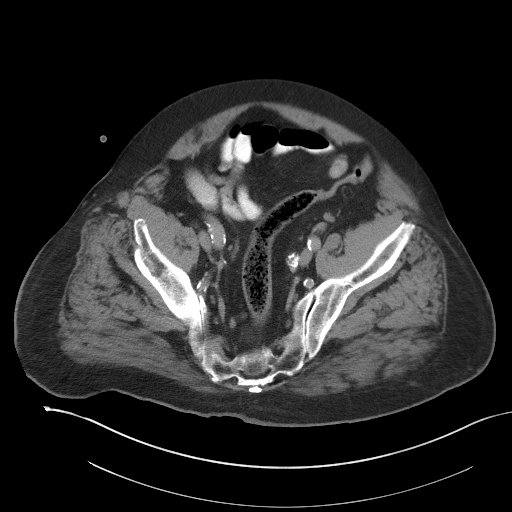
[im 43/112  soft-tissue]
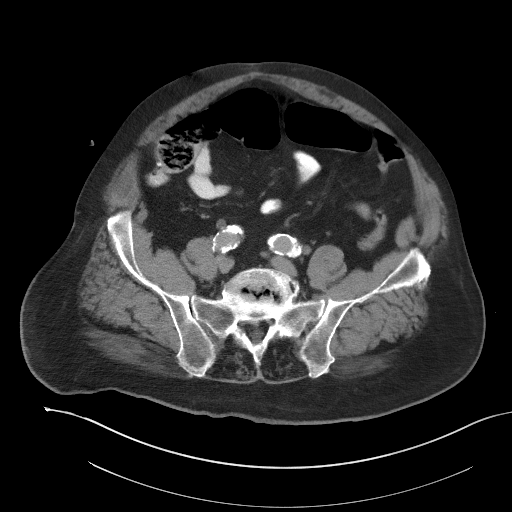
[im 52/112  soft-tissue]
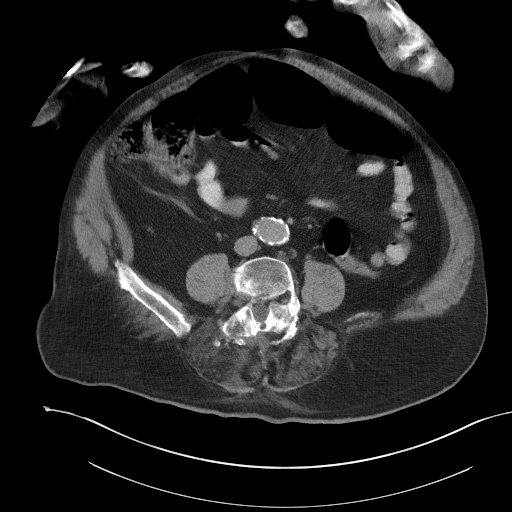
[im 60/112  soft-tissue]
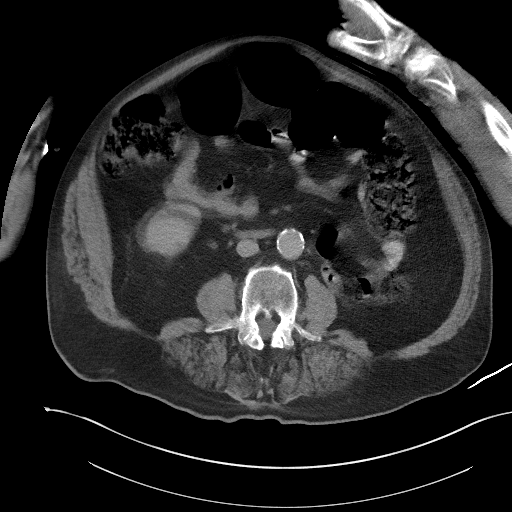
[im 69/112  soft-tissue]
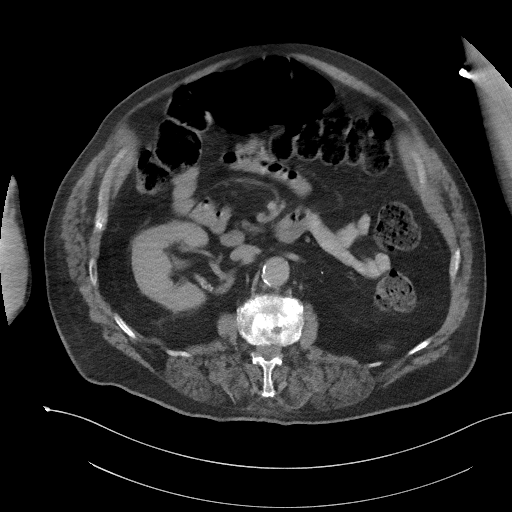
[im 77/112  soft-tissue]
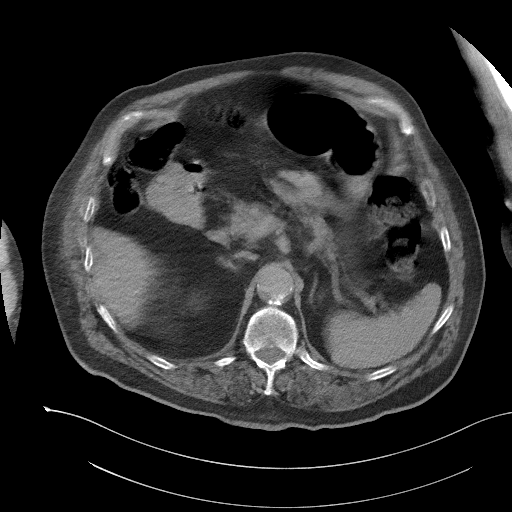
[im 77/112  bone]
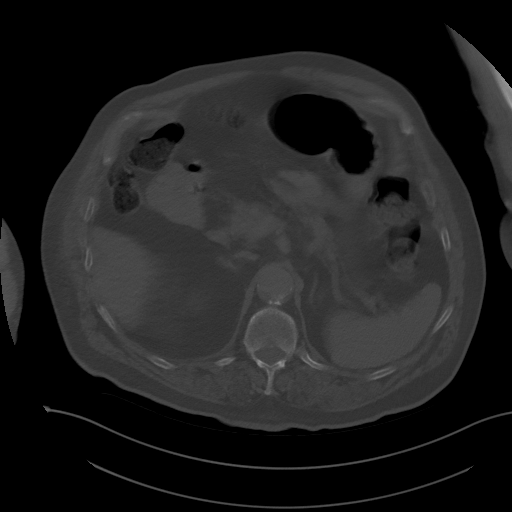
[im 86/112  soft-tissue]
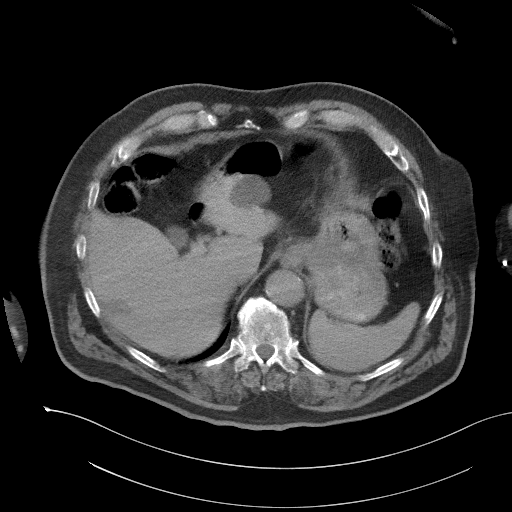
[im 94/112  soft-tissue]
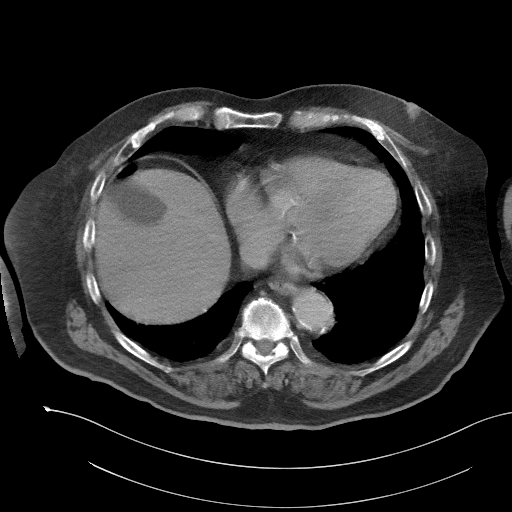
[im 103/112  soft-tissue]
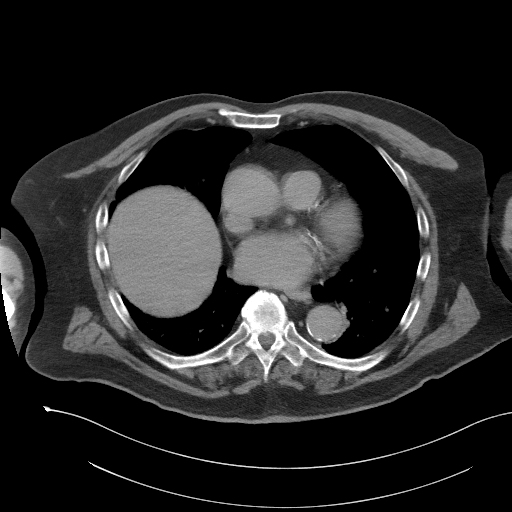

[Series 7: coronal st · coronal · 1.01mm/px · 3 of 133 slices shown]
[im 45/133  soft-tissue]
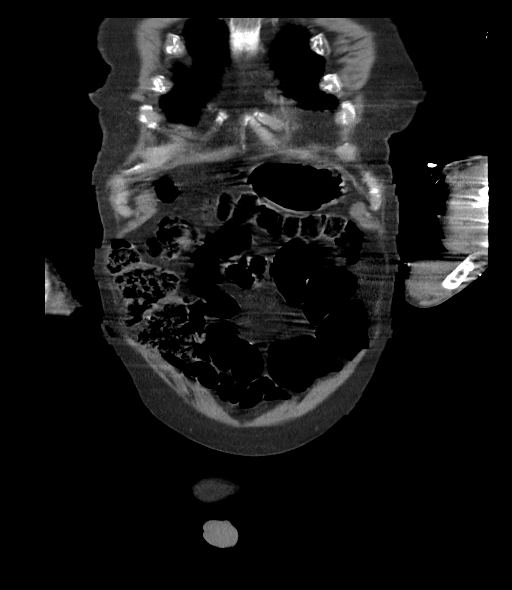
[im 59/133  soft-tissue]
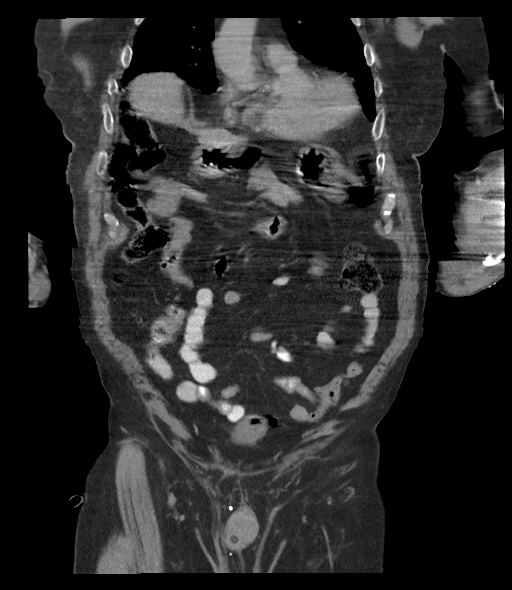
[im 74/133  soft-tissue]
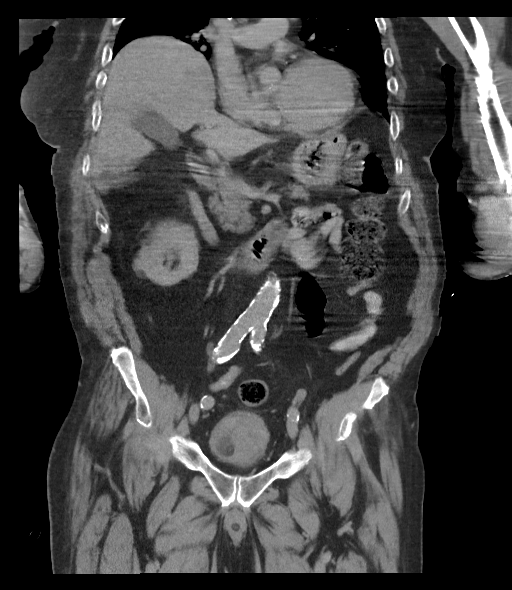

[15 of 46 positions shown; findings below may reference images not displayed]

RADIATION DOSE REDUCTION: This exam was performed according to the
departmental dose-optimization program which includes automated
exposure control, adjustment of the mA and/or kV according to
patient size and/or use of iterative reconstruction technique.

CONTRAST:  80mL OMNIPAQUE IOHEXOL 350 MG/ML SOLN
FINDINGS: Lower chest: Coronary artery calcifications are seen. Linear
densities in the right lower lung fields may suggest minimal
subsegmental atelectasis. Breathing motion limits evaluation of
lower lung fields.

Hepatobiliary: There is no dilation of bile ducts. There is 3.6 cm
fluid density structure in the left lobe with no significant change.
Gallbladder is unremarkable.

Pancreas: No focal abnormality is seen.

Spleen: Unremarkable.

Adrenals/Urinary Tract: Adrenals are unremarkable. There is evidence
of previous left nephrectomy. There is no hydronephrosis in the
right kidney. There are calcifications in the renal artery branches.
No definite renal stones are seen. There is low-density structure in
the upper pole of right kidney which is not optimally characterized.
Urinary bladder is not distended. Foley catheter is seen in the
bladder.

Stomach/Bowel: Small hiatal hernia is seen. Small bowel loops are
not dilated. Appendix is not seen. There is no significant wall
thickening in the colon. There is no pericolic stranding. There is
incomplete distention of sigmoid.

Vascular/Lymphatic: Extensive atherosclerotic plaques and
calcifications are seen in the aorta and its major branches.

Reproductive: There is marked enlargement of prostate projecting
into the base of the bladder. There are scattered coarse
calcifications in the prostate.

Other: There is no ascites or pneumoperitoneum. There is diastasis
between the rectus muscles in the upper abdomen. Left inguinal
hernia containing fat is seen.

Musculoskeletal: Degenerative changes are noted in the lumbar spine
with spinal stenosis and encroachment of neural foramina at multiple
levels. There is minimal retrolisthesis at L2-L3 level. There is
previous laminectomy at L3-L4 level.
IMPRESSION: There is no evidence of intestinal obstruction or pneumoperitoneum.
Status post left nephrectomy. There is no hydronephrosis in the
right kidney.

Coronary artery calcifications are seen. Cyst is seen in the liver.
Small hiatal hernia. Markedly enlarged prostate. Lumbar spondylosis.
Other findings as described in the body of the report.

## 2022-11-03 LAB — URINE CULTURE: Culture: >=100000 — AB

## 2022-11-04 LAB — URINE CULTURE: Culture: >=100000 — AB

## 2022-11-27 ENCOUNTER — Emergency Department: Payer: Medicare Other

## 2022-11-27 ENCOUNTER — Other Ambulatory Visit: Payer: Self-pay

## 2022-11-27 ENCOUNTER — Inpatient Hospital Stay
Admission: EM | Admit: 2022-11-27 | Discharge: 2022-12-04 | Disposition: A | Discharge status: Skilled Nursing Facility | Payer: Medicare Other | Attending: Internal Medicine | Admitting: Internal Medicine

## 2022-11-27 DIAGNOSIS — S72002A Fracture of unspecified part of neck of left femur, initial encounter for closed fracture: Principal | ICD-10-CM | POA: Diagnosis present

## 2022-11-27 DIAGNOSIS — Z8673 Personal history of transient ischemic attack (TIA), and cerebral infarction without residual deficits: Secondary | ICD-10-CM

## 2022-11-27 DIAGNOSIS — I110 Hypertensive heart disease with heart failure: Secondary | ICD-10-CM | POA: Diagnosis present

## 2022-11-27 DIAGNOSIS — E871 Hypo-osmolality and hyponatremia: Secondary | ICD-10-CM | POA: Insufficient documentation

## 2022-11-27 DIAGNOSIS — D62 Acute posthemorrhagic anemia: Secondary | ICD-10-CM | POA: Diagnosis not present

## 2022-11-27 DIAGNOSIS — Y9301 Activity, walking, marching and hiking: Secondary | ICD-10-CM | POA: Diagnosis present

## 2022-11-27 DIAGNOSIS — Z6829 Body mass index (BMI) 29.0-29.9, adult: Secondary | ICD-10-CM

## 2022-11-27 DIAGNOSIS — W010XXA Fall on same level from slipping, tripping and stumbling without subsequent striking against object, initial encounter: Secondary | ICD-10-CM | POA: Diagnosis present

## 2022-11-27 DIAGNOSIS — Z87891 Personal history of nicotine dependence: Secondary | ICD-10-CM

## 2022-11-27 DIAGNOSIS — Z905 Acquired absence of kidney: Secondary | ICD-10-CM | POA: Diagnosis not present

## 2022-11-27 DIAGNOSIS — I5032 Chronic diastolic (congestive) heart failure: Secondary | ICD-10-CM | POA: Diagnosis present

## 2022-11-27 DIAGNOSIS — T83511A Infection and inflammatory reaction due to indwelling urethral catheter, initial encounter: Secondary | ICD-10-CM | POA: Diagnosis present

## 2022-11-27 DIAGNOSIS — N319 Neuromuscular dysfunction of bladder, unspecified: Secondary | ICD-10-CM | POA: Diagnosis present

## 2022-11-27 DIAGNOSIS — Z7902 Long term (current) use of antithrombotics/antiplatelets: Secondary | ICD-10-CM | POA: Diagnosis not present

## 2022-11-27 DIAGNOSIS — I739 Peripheral vascular disease, unspecified: Secondary | ICD-10-CM | POA: Diagnosis present

## 2022-11-27 DIAGNOSIS — F0150 Vascular dementia without behavioral disturbance: Secondary | ICD-10-CM | POA: Diagnosis present

## 2022-11-27 DIAGNOSIS — Z85528 Personal history of other malignant neoplasm of kidney: Secondary | ICD-10-CM

## 2022-11-27 DIAGNOSIS — Z789 Other specified health status: Secondary | ICD-10-CM | POA: Insufficient documentation

## 2022-11-27 DIAGNOSIS — R5381 Other malaise: Secondary | ICD-10-CM | POA: Diagnosis present

## 2022-11-27 DIAGNOSIS — R000 Tachycardia, unspecified: Secondary | ICD-10-CM | POA: Diagnosis not present

## 2022-11-27 DIAGNOSIS — N400 Enlarged prostate without lower urinary tract symptoms: Secondary | ICD-10-CM | POA: Diagnosis present

## 2022-11-27 DIAGNOSIS — E785 Hyperlipidemia, unspecified: Secondary | ICD-10-CM | POA: Diagnosis present

## 2022-11-27 DIAGNOSIS — K219 Gastro-esophageal reflux disease without esophagitis: Secondary | ICD-10-CM | POA: Diagnosis present

## 2022-11-27 DIAGNOSIS — Z8619 Personal history of other infectious and parasitic diseases: Secondary | ICD-10-CM

## 2022-11-27 DIAGNOSIS — E872 Acidosis, unspecified: Secondary | ICD-10-CM | POA: Insufficient documentation

## 2022-11-27 DIAGNOSIS — S51012A Laceration without foreign body of left elbow, initial encounter: Secondary | ICD-10-CM | POA: Diagnosis present

## 2022-11-27 DIAGNOSIS — G629 Polyneuropathy, unspecified: Secondary | ICD-10-CM | POA: Diagnosis present

## 2022-11-27 DIAGNOSIS — Z79899 Other long term (current) drug therapy: Secondary | ICD-10-CM

## 2022-11-27 DIAGNOSIS — I10 Essential (primary) hypertension: Secondary | ICD-10-CM | POA: Diagnosis present

## 2022-11-27 DIAGNOSIS — R531 Weakness: Secondary | ICD-10-CM | POA: Diagnosis present

## 2022-11-27 DIAGNOSIS — I4891 Unspecified atrial fibrillation: Secondary | ICD-10-CM | POA: Diagnosis present

## 2022-11-27 DIAGNOSIS — Y846 Urinary catheterization as the cause of abnormal reaction of the patient, or of later complication, without mention of misadventure at the time of the procedure: Secondary | ICD-10-CM | POA: Diagnosis present

## 2022-11-27 DIAGNOSIS — B9620 Unspecified Escherichia coli [E. coli] as the cause of diseases classified elsewhere: Secondary | ICD-10-CM | POA: Diagnosis present

## 2022-11-27 DIAGNOSIS — N390 Urinary tract infection, site not specified: Secondary | ICD-10-CM | POA: Diagnosis present

## 2022-11-27 DIAGNOSIS — N3281 Overactive bladder: Secondary | ICD-10-CM | POA: Diagnosis present

## 2022-11-27 DIAGNOSIS — Z7982 Long term (current) use of aspirin: Secondary | ICD-10-CM

## 2022-11-27 DIAGNOSIS — E663 Overweight: Secondary | ICD-10-CM | POA: Insufficient documentation

## 2022-11-27 LAB — COMPREHENSIVE METABOLIC PANEL
ALT: 18 U/L (ref 0–44)
AST: 20 U/L (ref 15–41)
Albumin: 3.2 g/dL — ABNORMAL LOW (ref 3.5–5.0)
Alkaline Phosphatase: 61 U/L (ref 38–126)
Anion gap: 8 (ref 5–15)
BUN: 25 mg/dL — ABNORMAL HIGH (ref 8–23)
CO2: 20 mmol/L — ABNORMAL LOW (ref 22–32)
Calcium: 8.3 mg/dL — ABNORMAL LOW (ref 8.9–10.3)
Chloride: 103 mmol/L (ref 98–111)
Creatinine, Ser: 1.07 mg/dL (ref 0.61–1.24)
GFR, Estimated: >60 mL/min (ref >60)
Glucose, Bld: 125 mg/dL — ABNORMAL HIGH (ref 70–99)
Potassium: 4.4 mmol/L (ref 3.5–5.1)
Sodium: 131 mmol/L — ABNORMAL LOW (ref 135–145)
Total Bilirubin: 0.6 mg/dL (ref 0.3–1.2)
Total Protein: 6.3 g/dL — ABNORMAL LOW (ref 6.5–8.1)

## 2022-11-27 LAB — CBC WITH DIFFERENTIAL/PLATELET
Abs Immature Granulocytes: 0.06 10*3/uL (ref 0.00–0.07)
Basophils Absolute: 0 10*3/uL (ref 0.0–0.1)
Basophils Relative: 0 %
Eosinophils Absolute: 0.1 10*3/uL (ref 0.0–0.5)
Eosinophils Relative: 2 %
HCT: 32.8 % — ABNORMAL LOW (ref 39.0–52.0)
Hemoglobin: 10.2 g/dL — ABNORMAL LOW (ref 13.0–17.0)
Immature Granulocytes: 1 %
Lymphocytes Relative: 20 %
Lymphs Abs: 1.2 10*3/uL (ref 0.7–4.0)
MCH: 26.8 pg (ref 26.0–34.0)
MCHC: 31.1 g/dL (ref 30.0–36.0)
MCV: 86.1 fL (ref 80.0–100.0)
Monocytes Absolute: 0.5 10*3/uL (ref 0.1–1.0)
Monocytes Relative: 8 %
Neutro Abs: 4.3 10*3/uL (ref 1.7–7.7)
Neutrophils Relative %: 69 %
Platelets: 170 10*3/uL (ref 150–400)
RBC: 3.81 MIL/uL — ABNORMAL LOW (ref 4.22–5.81)
RDW: 15.5 % (ref 11.5–15.5)
WBC: 6.2 10*3/uL (ref 4.0–10.5)
nRBC: 0 % (ref 0.0–0.2)

## 2022-11-27 LAB — APTT: aPTT: 33 seconds (ref 24–36)

## 2022-11-27 LAB — PROTIME-INR
INR: 1.1 (ref 0.8–1.2)
Prothrombin Time: 14.1 seconds (ref 11.4–15.2)

## 2022-11-27 LAB — BRAIN NATRIURETIC PEPTIDE: B Natriuretic Peptide: 223.8 pg/mL — ABNORMAL HIGH (ref 0.0–100.0)

## 2022-11-27 MED ORDER — ASPIRIN 81 MG PO TBEC: 81.0000 mg | DELAYED_RELEASE_TABLET | Freq: Every day | ORAL | Status: DC

## 2022-11-27 MED ORDER — MORPHINE SULFATE (PF) 2 MG/ML IV SOLN
2.0000 mg | Freq: Once | INTRAVENOUS | Status: AC
  Administered 2022-11-27: 2 mg via INTRAVENOUS
  Filled 2022-11-27: qty 1

## 2022-11-27 MED ORDER — CLOPIDOGREL BISULFATE 75 MG PO TABS: 75.0000 mg | ORAL_TABLET | Freq: Every day | ORAL | Status: DC

## 2022-11-27 MED ORDER — ONDANSETRON HCL 4 MG/2ML IJ SOLN
4.0000 mg | Freq: Once | INTRAMUSCULAR | Status: AC
  Administered 2022-11-27: 4 mg via INTRAVENOUS
  Filled 2022-11-27: qty 2

## 2022-11-27 MED ORDER — PANTOPRAZOLE SODIUM 20 MG PO TBEC
20.0000 mg | DELAYED_RELEASE_TABLET | Freq: Every day | ORAL | Status: DC
  Administered 2022-11-29 – 2022-12-04 (×6): 20 mg via ORAL
  Filled 2022-11-27 (×7): qty 1

## 2022-11-27 MED ORDER — PROSIGHT PO TABS
1.0000 | ORAL_TABLET | Freq: Every day | ORAL | Status: DC
  Filled 2022-11-27: qty 1

## 2022-11-27 MED ORDER — MORPHINE SULFATE (PF) 2 MG/ML IV SOLN
0.5000 mg | INTRAVENOUS | Status: DC | PRN
  Administered 2022-11-27 – 2022-11-28 (×2): 0.5 mg via INTRAVENOUS
  Filled 2022-11-27 (×2): qty 1

## 2022-11-27 MED ORDER — ATORVASTATIN CALCIUM 20 MG PO TABS
40.0000 mg | ORAL_TABLET | Freq: Every day | ORAL | Status: DC
  Administered 2022-11-28 – 2022-12-04 (×7): 40 mg via ORAL
  Filled 2022-11-27 (×7): qty 2

## 2022-11-27 MED ORDER — GABAPENTIN 300 MG PO CAPS
300.0000 mg | ORAL_CAPSULE | Freq: Every day | ORAL | Status: DC
  Administered 2022-11-27 – 2022-12-03 (×7): 300 mg via ORAL
  Filled 2022-11-27 (×7): qty 1

## 2022-11-27 MED ORDER — TAMSULOSIN HCL 0.4 MG PO CAPS
0.4000 mg | ORAL_CAPSULE | Freq: Every day | ORAL | Status: DC
  Administered 2022-11-28 – 2022-12-04 (×8): 0.4 mg via ORAL
  Filled 2022-11-27 (×7): qty 1

## 2022-11-27 MED ORDER — HYDROCODONE-ACETAMINOPHEN 5-325 MG PO TABS: 1.0000 | ORAL_TABLET | Freq: Four times a day (QID) | ORAL | Status: DC | PRN

## 2022-11-27 MED ORDER — SPIRONOLACTONE 25 MG PO TABS: 25.0000 mg | ORAL_TABLET | Freq: Every day | ORAL | Status: DC

## 2022-11-27 MED ORDER — METOPROLOL SUCCINATE ER 25 MG PO TB24
12.5000 mg | ORAL_TABLET | Freq: Every day | ORAL | Status: DC
  Administered 2022-11-28 – 2022-12-03 (×4): 12.5 mg via ORAL
  Filled 2022-11-27 (×6): qty 1

## 2022-11-27 MED ORDER — CEFAZOLIN SODIUM-DEXTROSE 2-4 GM/100ML-% IV SOLN
2.0000 g | Freq: Once | INTRAVENOUS | Status: AC
  Administered 2022-11-28: 2 g via INTRAVENOUS
  Filled 2022-11-27: qty 100

## 2022-11-27 MED ORDER — POLYETHYLENE GLYCOL 3350 17 G PO PACK: 17.0000 g | PACK | Freq: Every day | ORAL | Status: DC | PRN

## 2022-11-27 MED ORDER — FINASTERIDE 5 MG PO TABS
5.0000 mg | ORAL_TABLET | Freq: Every day | ORAL | Status: DC
  Administered 2022-11-28 – 2022-12-04 (×7): 5 mg via ORAL
  Filled 2022-11-27 (×7): qty 1

## 2022-11-27 MED ORDER — FENTANYL CITRATE PF 50 MCG/ML IJ SOSY
25.0000 ug | PREFILLED_SYRINGE | Freq: Once | INTRAMUSCULAR | Status: DC
  Filled 2022-11-27: qty 1

## 2022-11-27 MED ORDER — MIRABEGRON ER 50 MG PO TB24
50.0000 mg | ORAL_TABLET | Freq: Every day | ORAL | Status: DC
  Administered 2022-11-29 – 2022-12-04 (×6): 50 mg via ORAL
  Filled 2022-11-27 (×7): qty 1

## 2022-11-27 NOTE — Consult Note (Signed)
ORTHOPAEDIC CONSULTATION  REQUESTING PHYSICIAN: Venora Maples, MD  Chief Complaint:   L hip pain  History of Present Illness: Adam Rios. is a 82 y.o. malewho had a fall earlier today after he tripped over his Foley catheter (he has neurogenic bladder).  The patient noted immediate hip pain and inability to ambulate.  The patient ambulates with a walker at baseline.  The patient lives at home with his daughter.  Pain is worse with any sort of movement.  X-rays in the emergency department show a left displaced femoral neck fracture.  He does have a medical history of prior CVA, PAD, vascular dementia, neurogenic bladder, and renal cell carcinoma. He does take Plavix.   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HCC)    Hypertension    Neurogenic bladder    Renal cell carcinoma (HCC) 2005   Renal disorder    Stroke (HCC)     01/17/18, 12/21   Some weakness and cognitive decline   Past Surgical History:  Procedure Laterality Date   APPENDECTOMY     CATARACT EXTRACTION W/PHACO Right 07/22/2020   Procedure: CATARACT EXTRACTION PHACO AND INTRAOCULAR LENS PLACEMENT (IOC) RIGHT;  Surgeon: Nevada Crane, MD;  Location: [facility];  Service: Ophthalmology;  Laterality: Right;  4.77 0:44.2   CATARACT EXTRACTION W/PHACO Left 08/05/2020   Procedure: CATARACT EXTRACTION PHACO AND INTRAOCULAR LENS PLACEMENT (IOC) LEFT 10.73 01:13.8;  Surgeon: Nevada Crane, MD;  Location: [facility];  Service: Ophthalmology;  Laterality: Left;  Use eye stretcher not chair   HERNIA REPAIR     LUMBAR FUSION     NEPHRECTOMY Left 2005   TEMPORARY PACEMAKER N/A 05/03/2022   Procedure: TEMPORARY PACEMAKER;  Surgeon: Alwyn Pea, MD;  Location: [facility];  Service: Cardiovascular;   Laterality: N/A;   Social History   Socioeconomic History   Marital status: Divorced    Spouse name: Not on file   Number of children: Not on file   Years of education: Not on file   Highest education level: Not on file  Occupational History   Not on file  Tobacco Use   Smoking status: Former    Packs/day: 0.25    Years: 10.00    Additional pack years: 0.00    Total pack years: 2.50    Types: Cigarettes    Quit date: 2005    Years since quitting: 19.5   Smokeless tobacco: Never  Vaping Use   Vaping Use: Never used  Substance and Sexual Activity   Alcohol use: Not Currently   Drug use: Never   Sexual activity: Not Currently  Other Topics Concern   Not on file  Social History Narrative   Not on file   Social Determinants of Health   Financial Resource Strain: Not on file  Food Insecurity: No Food Insecurity (09/29/2022)   Hunger Vital Sign    Worried About Running Out of Food in the Last Year: Never true    Ran Out of Food in the Last Year: Never true  Transportation Needs: No Transportation Needs (09/29/2022)   PRAPARE - Administrator, Civil Service (Medical): No    Lack of Transportation (Non-Medical): No  Physical Activity: Not on file  Stress: Not on file  Social Connections: Not on file   Family History  Problem Relation Age of  Onset   Cerebral aneurysm Father    Cerebral aneurysm Sister    Allergies  Allergen Reactions   Empagliflozin     Recurrent UTI with sepsis on jardiance.   Fentanyl Nausea Only   Oxycodone Nausea And Vomiting   Prior to Admission medications   Medication Sig Start Date End Date Taking? Authorizing Provider  acetaminophen (TYLENOL) 325 MG tablet Take 500 mg by mouth every 6 (six) hours as needed. 08/14/14  Yes [provider]  aspirin 81 MG EC tablet Take 81 mg by mouth daily as needed.   Yes [provider]  atorvastatin (LIPITOR) 40 MG tablet Take 40 mg by mouth daily.   Yes [provider]   clopidogrel (PLAVIX) 75 MG tablet Take 75 mg by mouth daily. 09/08/22  Yes [provider]  finasteride (PROSCAR) 5 MG tablet Take 5 mg by mouth daily. 04/27/22  Yes [provider]  folic acid (FOLVITE) 1 MG tablet Take 1 tablet (1 mg total) by mouth daily. 10/14/22 01/12/23 Yes Gillis Santa, MD  gabapentin (NEURONTIN) 300 MG capsule Take 1 capsule (300 mg total) by mouth at bedtime. 06/14/22  Yes Vasireddy, Clerance Lav, MD  hydrocortisone cream ([employer] CORTISONE MAXIMUM STRENGTH) 1 % Apply 1 Application topically as needed for itching.   Yes [provider]  iron polysaccharides (NIFEREX) 150 MG capsule Take 1 capsule (150 mg total) by mouth daily. 10/14/22 01/12/23 Yes Gillis Santa, MD  metoprolol succinate (TOPROL-XL) 25 MG 24 hr tablet Take 0.5 tablets (12.5 mg total) by mouth daily. 10/13/22  Yes Gillis Santa, MD  mirabegron ER (MYRBETRIQ) 50 MG TB24 tablet Take 1 tablet (50 mg total) by mouth daily. 08/28/22  Yes Wouk, Wilfred Curtis, MD  pantoprazole (PROTONIX) 20 MG tablet Take 1 tablet (20 mg total) by mouth daily. 10/14/22 01/12/23 Yes Gillis Santa, MD  senna-docusate (SENOKOT-S) 8.6-50 MG tablet Take 1 tablet by mouth 2 (two) times daily as needed. 08/15/22  Yes Wieting, Richard, MD  spironolactone (ALDACTONE) 25 MG tablet Take 1 tablet by mouth daily. 11/05/22 11/05/23 Yes [provider]  tamsulosin (FLOMAX) 0.4 MG CAPS capsule Take 1 capsule by mouth daily. 10/30/22  Yes [provider]  vitamin B-12 (CYANOCOBALAMIN) 1000 MCG tablet Take 1,000 mcg by mouth daily.   Yes [provider]  cyclobenzaprine (FLEXERIL) 5 MG tablet Take 5-10 mg by mouth 3 (three) times daily as needed for muscle spasms. Patient not taking: Reported on 11/27/2022 07/30/22   [provider]  diclofenac Sodium (VOLTAREN) 1 % GEL Apply 2 g topically 2 (two) times daily as needed. Patient not taking: Reported on 11/27/2022 05/11/21   [provider]  donepezil  (ARICEPT) 10 MG tablet Take 10 mg by mouth at bedtime. Patient not taking: Reported on 11/27/2022    [provider]  polyethylene glycol (MIRALAX / GLYCOLAX) 17 g packet Take 17 g by mouth daily as needed for severe constipation. Patient not taking: Reported on 11/27/2022 08/15/22   Alford Highland, MD  potassium chloride SA (KLOR-CON M) 20 MEQ tablet Take 1 tablet (20 mEq total) by mouth 2 (two) times daily for 3 days. 11/01/22 11/04/22  Concha Se, MD  torsemide (DEMADEX) 20 MG tablet Take 40 mg by mouth 2 (two) times daily. Patient not taking: Reported on 11/27/2022 10/19/22   [provider]  Vitamin D, Ergocalciferol, (DRISDOL) 1.25 MG (50000 UNIT) CAPS capsule Take 1 capsule (50,000 Units total) by mouth every 7 (seven) days. 10/20/22 01/18/23  Gillis Santa,  MD   Recent Labs    11/27/22 1304 11/27/22 1632  WBC 6.2  --   HGB 10.2*  --   HCT 32.8*  --   PLT 170  --   K 4.4  --   CL 103  --   CO2 20*  --   BUN 25*  --   CREATININE 1.07  --   GLUCOSE 125*  --   CALCIUM 8.3*  --   INR  --  1.1   DG Chest 1 View  Result Date: 11/27/2022 CLINICAL DATA:  Trauma, fall EXAM: CHEST  1 VIEW COMPARISON:  09/29/2022 FINDINGS: Transverse diameter of heart is increased. There are no signs of pulmonary edema or focal pulmonary consolidation. There is no pleural effusion or pneumothorax. Patient's chin is partially obscuring the apices. Pacemaker battery is seen in the left infraclavicular region. There is linear metallic density overlying the left hilum, possibly intravascular structure from previous vascular intervention or monitoring device. IMPRESSION: There are no signs of pulmonary edema or focal pulmonary consolidation. Electronically Signed   By: Ernie Avena M.D.   On: 11/27/2022 14:19   DG Knee Complete 4 Views Left  Result Date: 11/27/2022 CLINICAL DATA:  Trauma, fall EXAM: LEFT KNEE - COMPLETE 4+ VIEW COMPARISON:  None Available. FINDINGS: No displaced fracture or  dislocation is seen. There is no significant effusion. In 2 of the images, there is questionable irregularity in the medial aspect of medial tibial plateau. Bony spurs are seen in patella. Arterial calcifications are seen in soft tissues. IMPRESSION: No displaced fracture or dislocation is seen. There is questionable minimal cortical irregularity in the medial aspect of medial tibial plateau which may be a normal variation or residual change from previous injury. Less likely possibility would be recent undisplaced fracture. There is no significant effusion in the left knee joint. Degenerative changes are noted in the patella with bony spurs. Arteriosclerosis. Electronically Signed   By: Ernie Avena M.D.   On: 11/27/2022 14:14   DG Elbow Complete Left  Result Date: 11/27/2022 CLINICAL DATA:  Trauma, fall EXAM: LEFT ELBOW - COMPLETE 3+ VIEW COMPARISON:  None Available. FINDINGS: No recent fracture or dislocation is seen. There is 1 mm smoothly marginated calcification adjacent to the olecranon process. There is no significant displacement of posterior fat pad. Small bony spurs are noted in olecranon and coronoid processes and proximal ulna. IMPRESSION: No recent fracture or dislocation is seen in left elbow. 1 mm smoothly marginated calcification adjacent to the olecranon process may suggest calcific tendinosis. Degenerative changes with bony spurs are noted in proximal ulna. Electronically Signed   By: Ernie Avena M.D.   On: 11/27/2022 14:11   DG Hip Unilat With Pelvis 2-3 Views Left  Result Date: 11/27/2022 CLINICAL DATA:  Trauma, fall EXAM: DG HIP (WITH OR WITHOUT PELVIS) 2-3V LEFT COMPARISON:  None Available. FINDINGS: Fracture is seen in the neck cough left femur. There is superior displacement of greater trochanter in relation to the head. There is overriding of fracture fragments. There is no dislocation. There is possible minimal flattening of right femoral head. There is possible  cortical irregularity in the lateral margin of right ischium. IMPRESSION: Displaced fracture is seen in the neck of left femur. There is no dislocation. There is possible minimal flattening of right femoral head. There is possible cortical irregularity in the lateral margin of right ischium. These findings are not optimally evaluated. If clinically warranted, routine images of right hip may be considered. Electronically Signed  By: Ernie Avena M.D.   On: 11/27/2022 14:09     Positive ROS: All other systems have been reviewed and were otherwise negative with the exception of those mentioned in the HPI and as above.  Physical Exam: BP 126/77   Pulse 73   Temp 97.8 F (36.6 C) (Oral)   Resp 15   Ht 6\' 1"  (1.854 m)   Wt 101.2 kg   SpO2 100%   BMI 29.42 kg/m  General:  Alert, no acute distress Psychiatric:  Patient is competent for consent with normal   Orthopedic Exam:  LLE: + DF/PF/EHL SILT grossly over foot Foot wwp +Log roll/axial load Leg shortened and externally rotated  RLE: 5/5 DF/PF/EHL SILT s/s/t/sp/dp distr Foot wwp Able to FF, ER, IR hip without significant discomfort  X-rays:  As above: L displaced femoral neck fracture. Degenerative changes of R femoral head and cortical irregularities of R ischium  Assessment/Plan: Adam Rios. is a 82 y.o. male with a L displaced femoral neck fracture   1. I discussed the various treatment options including both surgical and non-surgical management of the fracture with the patient and/or family (medical PoA). We discussed the high risk of perioperative complications due to patient's age, dementia, and other co-morbidities. After discussion of risks, benefits, and alternatives to surgery, the family and/or patient were in agreement to proceed with surgery. The goals of surgery would be to provide adequate pain relief and allow for mobilization. Plan for surgery is L hip hemiarthroplasty tomorrow, 11/28/22. 2. NPO after  midnight 3. Hold anticoagulation in advance of OR 4. Admit to Hospitalist service   Signa Kell   11/27/2022 6:32 PM

## 2022-11-27 NOTE — ED Provider Notes (Signed)
[facility] Provider Note    Event Date/Time   First MD Initiated Contact with Patient 11/27/22 1249     (approximate)   History   Fall, Hip Pain, and Weakness   HPI  Adam Rios. is a 82 y.o. male with history of hypertension, neurogenic bladder, renal cell carcinoma s/p nephrectomy, A-fib presenting to the emergency department for evaluation after fall.  Patient was walking with his rollator earlier when he tripped himself on his Foley catheter causing his rollator to shift and him to fall onto his left side.  He did not hit his head.  No loss of consciousness.  Not on anticoagulation.  Does report feeling some general weakness over the past couple days, but denies any preceding chest pain, shortness of breath, lightheadedness.    Physical Exam   Triage Vital Signs: ED Triage Vitals  Enc Vitals Group     BP 11/27/22 1255 107/69     Pulse Rate 11/27/22 1255 84     Resp 11/27/22 1255 17     Temp 11/27/22 1255 97.9 F (36.6 C)     Temp src --      SpO2 11/27/22 1255 95 %     Weight 11/27/22 1256 223 lb (101.2 kg)     Height 11/27/22 1256 6\' 1"  (1.854 m)     Head Circumference --      Peak Flow --      Pain Score 11/27/22 1256 8     Pain Loc --      Pain Edu? --      Excl. in GC? --     Most recent vital signs: Vitals:   11/27/22 1420 11/27/22 1500  BP: 105/71 126/77  Pulse: 80 73  Resp: 15 15  Temp:    SpO2: (!) 86% 100%    Nursing notes and vital signs reviewed.  General: Adult male, laying in bed, awake interactive Head: Atraumatic Chest: Symmetric chest rise, no tenderness to palpation.  Cardiac: Regular rhythm and rate.  Respiratory: Lungs clear to auscultation Abdomen: Soft, nondistended. No tenderness to palpation.  Pelvis: Stable in AP and lateral compression.  Tenderness palpation over the left hip MSK: The left lower extremity is shortened and externally rotated.  Some pain with range of motion of the right lower  extremity, but is able to range it.  Unable to lift left lower extremity antigravity at the hip secondary to pain.  Mild tenderness to palpation over the anterior knee.  No tenderness over the distal left lower extremity.  Skin tear over the left elbow with mild associated tenderness.  Full range of motion of bilateral upper extremities.  2+ DP pulses bilaterally. Neuro: Alert, oriented. GCS 15. Normal sensation to light touch in bilateral upper and lower extremity. Skin: No evidence of burns or lacerations.  ED Results / Procedures / Treatments   Labs (all labs ordered are listed, but only abnormal results are displayed) Labs Reviewed  COMPREHENSIVE METABOLIC PANEL - Abnormal; Notable for the following components:      Result Value   Sodium 131 (*)    CO2 20 (*)    Glucose, Bld 125 (*)    BUN 25 (*)    Calcium 8.3 (*)    Total Protein 6.3 (*)    Albumin 3.2 (*)    All other components within normal limits  CBC WITH DIFFERENTIAL/PLATELET - Abnormal; Notable for the following components:   RBC 3.81 (*)    Hemoglobin 10.2 (*)  HCT 32.8 (*)    All other components within normal limits  PROTIME-INR  APTT     EKG EKG independently reviewed interpreted by myself (ER attending) demonstrates:  EKG demonstrates sinus rhythm at a rate of 83, PR 197, QRS 129, QTc 455, no acute ST changes, right bundle block noted  RADIOLOGY Imaging independently reviewed and interpreted by myself demonstrates:  Chest x-Lunabelle Oatley without focal consolidation.  Knee x-Estella Malatesta without obvious fracture.  Radiology notes questionable minimal cortical irregularity, not specifically consistent with patient's location of pain, lower suspicion that this is reflective of an acute fracture. Elbow x-Yesica Kemler without acute fracture. Hip x-Kalaya Infantino does demonstrate a displaced fracture of the left femoral neck as well as some cortical irregularity over the right femoral head.  PROCEDURES:  Critical Care performed:  No  Procedures   MEDICATIONS ORDERED IN ED: Medications  morphine (PF) 2 MG/ML injection 2 mg (2 mg Intravenous Given 11/27/22 1313)  ondansetron (ZOFRAN) injection 4 mg (4 mg Intravenous Given 11/27/22 1313)  morphine (PF) 2 MG/ML injection 2 mg (2 mg Intravenous Given 11/27/22 1543)     IMPRESSION / MDM / ASSESSMENT AND PLAN / ED COURSE  I reviewed the triage vital signs and the nursing notes.  Differential diagnosis includes, but is not limited to, left hip fracture, dislocation, other extremity injury, no clinical history suggestive of head or neck trauma, no evidence of thoracoabdominal trauma on exam.  Consideration for anemia, electrolyte abnormality, arrhythmia for etiology of patient's weakness.  Patient's presentation is most consistent with acute presentation with potential threat to life or bodily function.  82 year old male presenting to the emergency department for evaluation after a mechanical fall found to have left hip fracture.  Does report some weakness, but labs without significant derangement, stable anemia noted.  Case reviewed with Dr. Allena Katz with orthopedics.  He did recommend obtaining coags which have been added.  He will plan for operative intervention tomorrow.  Will reach to hospitalist team to discuss admission.  Case discussed with hospitalist team.  They will evaluate the patient for anticipated admission.      FINAL CLINICAL IMPRESSION(S) / ED DIAGNOSES   Final diagnoses:  Closed displaced fracture of left femoral neck (HCC)  Weakness     Rx / DC Orders   ED Discharge Orders     None        Note:  This document was prepared using Dragon voice recognition software and may include unintentional dictation errors.   [name], MD 11/27/22 [phone]

## 2022-11-27 NOTE — Assessment & Plan Note (Addendum)
Secondary to mechanical fall, however both patient and his daughter report that patient was feeling much weaker today.  Has a history of recurrent UTIs, patient denies any issue with his urinary tract and is not tender in suprapubic palpation.  Daughter however has noted weakness in the past as a symptom of impending UTI.  Will replace catheter and draw urinalysis and urine culture off new catheter.  Also has some lower extremity edema, denies any chest pain or shortness of breath, but is requiring intermittent supplemental oxygen, will evaluate BNP to see if CHF exacerbation is contributing. - N.p.o. at midnight for likely OR in the a.m. - UA and urine culture to evaluate possible UTIs precipitant - Check BNP - PT/OT consultation - TOC consultation - Pain meds as needed - Hold aspirin and Plavix

## 2022-11-27 NOTE — ED Triage Notes (Signed)
PT arrives via EMS from home. Pt reports generalized weakness since yesterday. Pt states he tripped over his foley back and landed on his left side. Pt arrives AxOx4. Denies hitting his head. Pt does have shortening and rotation noted to left leg. Pt c/o pain to left knee and left hip.

## 2022-11-27 NOTE — H&P (Signed)
History and Physical    Patient: Adam Rios. QVZ:[id number] DOB: March 19, 1941 DOA: 11/27/2022 DOS: the patient was seen and examined on 11/27/2022 PCP: Laqueta Due, MD  Patient coming from: Home  Chief Complaint:  Chief Complaint  Patient presents with   Fall   Hip Pain   Weakness   HPI: Adam Rios. is a 82 y.o. male with medical history significant for chronic diastolic CHF, recurrent UTI secondary to indwelling catheter, hypertension, neurogenic bladder, A-fib with RVR, PAD, history of stroke, who presents after a fall with left hip pain.  Patient reports he was ambulating at home with his walker when he tripped over his urine bag and Foley and fell hard on his left hip.  He endorses having pain in that area, denies numbness or tingling in his left foot.  He reports that he felt very weak earlier today and he feels that this also contributed to the fall.  In discussion with his daughter she also reports that he has seemed weak and wonders if he has a UTI as this has been a presenting symptom in the past.  In the ED vital signs were unremarkable, with pain medicines he did have some mild oxygen desaturations which improved with 2 L nasal cannula.  CBC showed stable anemia but otherwise normal, CMP largely unremarkable, coags normal.  EKG showed no signs of acute ischemia.  Plain films of the chest, left knee, and left elbow did not show any acute findings.  Plain films of the left hip showed displaced fracture of the neck of the left femur.  Orthopedics was consulted with tentative plan to take patient to the OR tomorrow.  Review of Systems: As mentioned in the history of present illness. All other systems reviewed and are negative. 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HCC)    Hypertension    Neurogenic bladder    Renal cell carcinoma (HCC) 2005    Renal disorder    Stroke (HCC)     01/17/18, 12/21   Some weakness and cognitive decline   Past Surgical History:  Procedure Laterality Date   APPENDECTOMY     CATARACT EXTRACTION W/PHACO Right 07/22/2020   Procedure: CATARACT EXTRACTION PHACO AND INTRAOCULAR LENS PLACEMENT (IOC) RIGHT;  Surgeon: Nevada Crane, MD;  Location: [facility];  Service: Ophthalmology;  Laterality: Right;  4.77 0:44.2   CATARACT EXTRACTION W/PHACO Left 08/05/2020   Procedure: CATARACT EXTRACTION PHACO AND INTRAOCULAR LENS PLACEMENT (IOC) LEFT 10.73 01:13.8;  Surgeon: Nevada Crane, MD;  Location: [facility];  Service: Ophthalmology;  Laterality: Left;  Use eye stretcher not chair   HERNIA REPAIR     LUMBAR FUSION     NEPHRECTOMY Left 2005   TEMPORARY PACEMAKER N/A 05/03/2022   Procedure: TEMPORARY PACEMAKER;  Surgeon: Alwyn Pea, MD;  Location: [facility];  Service: Cardiovascular;  Laterality: N/A;   Social History:  reports that he quit smoking about 19 years ago. His smoking use included cigarettes. He has a 2.50 pack-year smoking history. He has never used smokeless tobacco. He reports that he does not currently use alcohol. He reports that he does not use drugs.  Allergies  Allergen Reactions   Empagliflozin     Recurrent UTI with sepsis on jardiance.   Fentanyl Nausea Only   Oxycodone Nausea And Vomiting    Family  History  Problem Relation Age of Onset   Cerebral aneurysm Father    Cerebral aneurysm Sister     Prior to Admission medications   Medication Sig Start Date End Date Taking? Authorizing Provider  acetaminophen (TYLENOL) 325 MG tablet Take 500 mg by mouth every 6 (six) hours as needed. 08/14/14  Yes [provider]  aspirin 81 MG EC tablet Take 81 mg by mouth daily as needed.   Yes [provider]  atorvastatin (LIPITOR) 40 MG tablet Take 40 mg by mouth daily.   Yes [provider]  clopidogrel (PLAVIX) 75 MG tablet  Take 75 mg by mouth daily. 09/08/22  Yes [provider]  finasteride (PROSCAR) 5 MG tablet Take 5 mg by mouth daily. 04/27/22  Yes [provider]  folic acid (FOLVITE) 1 MG tablet Take 1 tablet (1 mg total) by mouth daily. 10/14/22 01/12/23 Yes Gillis Santa, MD  gabapentin (NEURONTIN) 300 MG capsule Take 1 capsule (300 mg total) by mouth at bedtime. 06/14/22  Yes Vasireddy, Clerance Lav, MD  hydrocortisone cream ([employer] CORTISONE MAXIMUM STRENGTH) 1 % Apply 1 Application topically as needed for itching.   Yes [provider]  iron polysaccharides (NIFEREX) 150 MG capsule Take 1 capsule (150 mg total) by mouth daily. 10/14/22 01/12/23 Yes Gillis Santa, MD  metoprolol succinate (TOPROL-XL) 25 MG 24 hr tablet Take 0.5 tablets (12.5 mg total) by mouth daily. 10/13/22  Yes Gillis Santa, MD  mirabegron ER (MYRBETRIQ) 50 MG TB24 tablet Take 1 tablet (50 mg total) by mouth daily. 08/28/22  Yes Wouk, Wilfred Curtis, MD  pantoprazole (PROTONIX) 20 MG tablet Take 1 tablet (20 mg total) by mouth daily. 10/14/22 01/12/23 Yes Gillis Santa, MD  senna-docusate (SENOKOT-S) 8.6-50 MG tablet Take 1 tablet by mouth 2 (two) times daily as needed. 08/15/22  Yes Wieting, Richard, MD  spironolactone (ALDACTONE) 25 MG tablet Take 1 tablet by mouth daily. 11/05/22 11/05/23 Yes [provider]  tamsulosin (FLOMAX) 0.4 MG CAPS capsule Take 1 capsule by mouth daily. 10/30/22  Yes [provider]  vitamin B-12 (CYANOCOBALAMIN) 1000 MCG tablet Take 1,000 mcg by mouth daily.   Yes [provider]  cyclobenzaprine (FLEXERIL) 5 MG tablet Take 5-10 mg by mouth 3 (three) times daily as needed for muscle spasms. Patient not taking: Reported on 11/27/2022 07/30/22   [provider]  diclofenac Sodium (VOLTAREN) 1 % GEL Apply 2 g topically 2 (two) times daily as needed. Patient not taking: Reported on 11/27/2022 05/11/21   [provider]  donepezil (ARICEPT) 10 MG tablet Take 10 mg by  mouth at bedtime. Patient not taking: Reported on 11/27/2022    [provider]  polyethylene glycol (MIRALAX / GLYCOLAX) 17 g packet Take 17 g by mouth daily as needed for severe constipation. Patient not taking: Reported on 11/27/2022 08/15/22   Alford Highland, MD  potassium chloride SA (KLOR-CON M) 20 MEQ tablet Take 1 tablet (20 mEq total) by mouth 2 (two) times daily for 3 days. 11/01/22 11/04/22  Concha Se, MD  torsemide (DEMADEX) 20 MG tablet Take 40 mg by mouth 2 (two) times daily. Patient not taking: Reported on 11/27/2022 10/19/22   [provider]  Vitamin D, Ergocalciferol, (DRISDOL) 1.25 MG (50000 UNIT) CAPS capsule Take 1 capsule (50,000 Units total) by mouth every 7 (seven) days. 10/20/22 01/18/23  Gillis Santa, MD    Physical Exam: Vitals:   11/27/22 1300 11/27/22 1420 11/27/22 1500 11/27/22 1633  BP: 103/68 105/71 126/77  Pulse: 82 80 73   Resp: 19 15 15    Temp:    97.8 F (36.6 C)  TempSrc:    Oral  SpO2: 93% (!) 86% 100%   Weight:      Height:       Physical Exam Constitutional:      General: He is not in acute distress.    Appearance: He is not ill-appearing, toxic-appearing or diaphoretic.  HENT:     Head: Normocephalic and atraumatic.     Mouth/Throat:     Mouth: Mucous membranes are moist.  Eyes:     General: No scleral icterus. Cardiovascular:     Rate and Rhythm: Normal rate and regular rhythm.     Heart sounds: Normal heart sounds.  Pulmonary:     Effort: Pulmonary effort is normal.     Breath sounds: Normal breath sounds.  Abdominal:     General: Abdomen is flat.     Palpations: Abdomen is soft.  Musculoskeletal:     Right lower leg: Edema present.     Left lower leg: Edema present.     Comments: Mild shortening and external rotation of left lower extremity  Neurological:     Mental Status: He is alert.  Psychiatric:        Mood and Affect: Mood normal.        Behavior: Behavior normal.        Thought Content: Thought  content normal.     Data Reviewed: Results for orders placed or performed during the hospital encounter of 11/27/22 (from the past 24 hour(s))  Comprehensive metabolic panel     Status: Abnormal   Collection Time: 11/27/22  1:04 PM  Result Value Ref Range   Sodium 131 (L) 135 - 145 mmol/L   Potassium 4.4 3.5 - 5.1 mmol/L   Chloride 103 98 - 111 mmol/L   CO2 20 (L) 22 - 32 mmol/L   Glucose, Bld 125 (H) 70 - 99 mg/dL   BUN 25 (H) 8 - 23 mg/dL   Creatinine, Ser 1.61 0.61 - 1.24 mg/dL   Calcium 8.3 (L) 8.9 - 10.3 mg/dL   Total Protein 6.3 (L) 6.5 - 8.1 g/dL   Albumin 3.2 (L) 3.5 - 5.0 g/dL   AST 20 15 - 41 U/L   ALT 18 0 - 44 U/L   Alkaline Phosphatase 61 38 - 126 U/L   Total Bilirubin 0.6 0.3 - 1.2 mg/dL   GFR, Estimated >09 >60 mL/min   Anion gap 8 5 - 15  CBC with Differential     Status: Abnormal   Collection Time: 11/27/22  1:04 PM  Result Value Ref Range   WBC 6.2 4.0 - 10.5 K/uL   RBC 3.81 (L) 4.22 - 5.81 MIL/uL   Hemoglobin 10.2 (L) 13.0 - 17.0 g/dL   HCT 45.4 (L) 09.8 - 11.9 %   MCV 86.1 80.0 - 100.0 fL   MCH 26.8 26.0 - 34.0 pg   MCHC 31.1 30.0 - 36.0 g/dL   RDW 14.7 82.9 - 56.2 %   Platelets 170 150 - 400 K/uL   nRBC 0.0 0.0 - 0.2 %   Neutrophils Relative % 69 %   Neutro Abs 4.3 1.7 - 7.7 K/uL   Lymphocytes Relative 20 %   Lymphs Abs 1.2 0.7 - 4.0 K/uL   Monocytes Relative 8 %   Monocytes Absolute 0.5 0.1 - 1.0 K/uL   Eosinophils Relative 2 %   Eosinophils Absolute 0.1 0.0 - 0.5  K/uL   Basophils Relative 0 %   Basophils Absolute 0.0 0.0 - 0.1 K/uL   Immature Granulocytes 1 %   Abs Immature Granulocytes 0.06 0.00 - 0.07 K/uL  Protime-INR     Status: None   Collection Time: 11/27/22  4:32 PM  Result Value Ref Range   Prothrombin Time 14.1 11.4 - 15.2 seconds   INR 1.1 0.8 - 1.2  APTT     Status: None   Collection Time: 11/27/22  4:32 PM  Result Value Ref Range   aPTT 33 24 - 36 seconds   DG Chest 1 View CLINICAL DATA:  Trauma, fall  EXAM: CHEST  1  VIEW  COMPARISON:  09/29/2022  FINDINGS: Transverse diameter of heart is increased. There are no signs of pulmonary edema or focal pulmonary consolidation. There is no pleural effusion or pneumothorax. Patient's chin is partially obscuring the apices. Pacemaker battery is seen in the left infraclavicular region. There is linear metallic density overlying the left hilum, possibly intravascular structure from previous vascular intervention or monitoring device.  IMPRESSION: There are no signs of pulmonary edema or focal pulmonary consolidation.  Electronically Signed   By: Ernie Avena M.D.   On: 11/27/2022 14:19 DG Knee Complete 4 Views Left CLINICAL DATA:  Trauma, fall  EXAM: LEFT KNEE - COMPLETE 4+ VIEW  COMPARISON:  None Available.  FINDINGS: No displaced fracture or dislocation is seen. There is no significant effusion. In 2 of the images, there is questionable irregularity in the medial aspect of medial tibial plateau. Bony spurs are seen in patella. Arterial calcifications are seen in soft tissues.  IMPRESSION: No displaced fracture or dislocation is seen. There is questionable minimal cortical irregularity in the medial aspect of medial tibial plateau which may be a normal variation or residual change from previous injury. Less likely possibility would be recent undisplaced fracture. There is no significant effusion in the left knee joint.  Degenerative changes are noted in the patella with bony spurs. Arteriosclerosis.  Electronically Signed   By: Ernie Avena M.D.   On: 11/27/2022 14:14 DG Elbow Complete Left CLINICAL DATA:  Trauma, fall  EXAM: LEFT ELBOW - COMPLETE 3+ VIEW  COMPARISON:  None Available.  FINDINGS: No recent fracture or dislocation is seen. There is 1 mm smoothly marginated calcification adjacent to the olecranon process. There is no significant displacement of posterior fat pad. Small bony spurs are noted in olecranon  and coronoid processes and proximal ulna.  IMPRESSION: No recent fracture or dislocation is seen in left elbow. 1 mm smoothly marginated calcification adjacent to the olecranon process may suggest calcific tendinosis. Degenerative changes with bony spurs are noted in proximal ulna.  Electronically Signed   By: Ernie Avena M.D.   On: 11/27/2022 14:11 DG Hip Unilat With Pelvis 2-3 Views Left CLINICAL DATA:  Trauma, fall  EXAM: DG HIP (WITH OR WITHOUT PELVIS) 2-3V LEFT  COMPARISON:  None Available.  FINDINGS: Fracture is seen in the neck cough left femur. There is superior displacement of greater trochanter in relation to the head. There is overriding of fracture fragments. There is no dislocation. There is possible minimal flattening of right femoral head. There is possible cortical irregularity in the lateral margin of right ischium.  IMPRESSION: Displaced fracture is seen in the neck of left femur. There is no dislocation.  There is possible minimal flattening of right femoral head. There is possible cortical irregularity in the lateral margin of right ischium. These findings are not optimally evaluated.  If clinically warranted, routine images of right hip may be considered.  Electronically Signed   By: Ernie Avena M.D.   On: 11/27/2022 14:09   Assessment and Plan: Adam Rios. is a 82 y.o. male with medical history significant for chronic diastolic CHF, recurrent UTI secondary to indwelling catheter, hypertension, neurogenic bladder, A-fib with RVR, PAD, history of stroke, who presents after a fall with left hip pain and is found to have a hip fracture.   * Closed left hip fracture (HCC) Secondary to mechanical fall, however both patient and his daughter report that patient was feeling much weaker today.  Has a history of recurrent UTIs, patient denies any issue with his urinary tract and is not tender in suprapubic palpation.  Daughter however has  noted weakness in the past as a symptom of impending UTI.  Will replace catheter and draw urinalysis and urine culture off new catheter.  Also has some lower extremity edema, denies any chest pain or shortness of breath, but is requiring intermittent supplemental oxygen, will evaluate BNP to see if CHF exacerbation is contributing. - N.p.o. at midnight for likely OR in the a.m. - UA and urine culture to evaluate possible UTIs precipitant - Check BNP - PT/OT consultation - TOC consultation - Pain meds as needed - Hold aspirin and Plavix  Known medical problems Hyperlipidemia-continue atorvastatin BPH-continue finasteride, tamsulosin Neuropathy-continue gabapentin CHF-continue Toprol, hold aspirin and Plavix OAB-continue Myrbetriq GERD-continue PPI Hypertension-continue spironolactone      Advance Care Planning:   Code Status: Full Code   Consults: Orthopedic surgery  Family Communication: Daughter [name] updated by telephone  Severity of Illness: The appropriate patient status for this patient is INPATIENT. Inpatient status is judged to be reasonable and necessary in order to provide the required intensity of service to ensure the patient's safety. The patient's presenting symptoms, physical exam findings, and initial radiographic and laboratory data in the context of their chronic comorbidities is felt to place them at high risk for further clinical deterioration. Furthermore, it is not anticipated that the patient will be medically stable for discharge from the hospital within 2 midnights of admission.   * I certify that at the point of admission it is my clinical judgment that the patient will require inpatient hospital care spanning beyond 2 midnights from the point of admission due to high intensity of service, high risk for further deterioration and high frequency of surveillance required.*  Author: Venora Maples, MD 11/27/2022 5:51 PM  For on call review [email or web address].

## 2022-11-27 NOTE — Assessment & Plan Note (Addendum)
Hyperlipidemia-continue atorvastatin BPH-continue finasteride, tamsulosin Neuropathy-continue gabapentin CHF-continue Toprol, hold aspirin and Plavix OAB-continue Myrbetriq GERD-continue PPI Hypertension-continue spironolactone

## 2022-11-28 ENCOUNTER — Encounter
Admission: EM | Disposition: A | Discharge status: Skilled Nursing Facility | Payer: Self-pay | Source: Home / Self Care | Attending: Internal Medicine

## 2022-11-28 ENCOUNTER — Other Ambulatory Visit: Payer: Self-pay

## 2022-11-28 ENCOUNTER — Inpatient Hospital Stay: Payer: Medicare Other | Admitting: Anesthesiology

## 2022-11-28 ENCOUNTER — Inpatient Hospital Stay: Payer: Medicare Other

## 2022-11-28 DIAGNOSIS — I10 Essential (primary) hypertension: Secondary | ICD-10-CM | POA: Diagnosis not present

## 2022-11-28 DIAGNOSIS — E872 Acidosis, unspecified: Secondary | ICD-10-CM | POA: Insufficient documentation

## 2022-11-28 DIAGNOSIS — I5032 Chronic diastolic (congestive) heart failure: Secondary | ICD-10-CM | POA: Diagnosis not present

## 2022-11-28 DIAGNOSIS — S72002A Fracture of unspecified part of neck of left femur, initial encounter for closed fracture: Secondary | ICD-10-CM | POA: Diagnosis not present

## 2022-11-28 DIAGNOSIS — E663 Overweight: Secondary | ICD-10-CM | POA: Insufficient documentation

## 2022-11-28 DIAGNOSIS — E871 Hypo-osmolality and hyponatremia: Secondary | ICD-10-CM | POA: Insufficient documentation

## 2022-11-28 HISTORY — PX: HIP ARTHROPLASTY: SHX981

## 2022-11-28 LAB — URINALYSIS, COMPLETE (UACMP) WITH MICROSCOPIC
Bilirubin Urine: NEGATIVE
Glucose, UA: NEGATIVE mg/dL
Ketones, ur: NEGATIVE mg/dL
Nitrite: NEGATIVE
Protein, ur: NEGATIVE mg/dL
Specific Gravity, Urine: 1.012 (ref 1.005–1.030)
pH: 5 (ref 5.0–8.0)

## 2022-11-28 SURGERY — HEMIARTHROPLASTY, HIP, DIRECT ANTERIOR APPROACH, FOR FRACTURE
Anesthesia: General | Site: Hip | Laterality: Left

## 2022-11-28 MED ORDER — METOCLOPRAMIDE HCL 5 MG/ML IJ SOLN: 5.0000 mg | Freq: Three times a day (TID) | INTRAMUSCULAR | Status: DC | PRN

## 2022-11-28 MED ORDER — 0.9 % SODIUM CHLORIDE (POUR BTL) OPTIME
TOPICAL | Status: DC | PRN
  Administered 2022-11-28: 1000 mL
  Administered 2022-11-28: 3000 mL

## 2022-11-28 MED ORDER — PHENYLEPHRINE HCL (PRESSORS) 10 MG/ML IV SOLN
INTRAVENOUS | Status: DC | PRN
  Administered 2022-11-28 (×3): 80 ug via INTRAVENOUS

## 2022-11-28 MED ORDER — SUGAMMADEX SODIUM 200 MG/2ML IV SOLN
INTRAVENOUS | Status: DC | PRN
  Administered 2022-11-28: 200 mg via INTRAVENOUS

## 2022-11-28 MED ORDER — MENTHOL 3 MG MT LOZG: 1.0000 | LOZENGE | OROMUCOSAL | Status: DC | PRN

## 2022-11-28 MED ORDER — PROPOFOL 10 MG/ML IV BOLUS
INTRAVENOUS | Status: AC
  Filled 2022-11-28: qty 20

## 2022-11-28 MED ORDER — BISACODYL 10 MG RE SUPP
10.0000 mg | Freq: Every day | RECTAL | Status: DC | PRN
  Administered 2022-12-03: 10 mg via RECTAL
  Filled 2022-11-28: qty 1

## 2022-11-28 MED ORDER — OXYCODONE HCL 5 MG PO TABS
5.0000 mg | ORAL_TABLET | ORAL | Status: DC | PRN
  Administered 2022-11-29: 5 mg via ORAL
  Filled 2022-11-28: qty 2
  Filled 2022-11-28 (×2): qty 1

## 2022-11-28 MED ORDER — ONDANSETRON HCL 4 MG/2ML IJ SOLN: 4.0000 mg | Freq: Once | INTRAMUSCULAR | Status: DC | PRN

## 2022-11-28 MED ORDER — PHENOL 1.4 % MT LIQD: 1.0000 | OROMUCOSAL | Status: DC | PRN

## 2022-11-28 MED ORDER — KETOROLAC TROMETHAMINE 15 MG/ML IJ SOLN
7.5000 mg | Freq: Four times a day (QID) | INTRAMUSCULAR | Status: AC
  Administered 2022-11-28 – 2022-11-29 (×4): 7.5 mg via INTRAVENOUS
  Filled 2022-11-28 (×4): qty 1

## 2022-11-28 MED ORDER — TORSEMIDE 20 MG PO TABS
40.0000 mg | ORAL_TABLET | Freq: Every day | ORAL | Status: DC
  Administered 2022-11-28 – 2022-12-04 (×7): 40 mg via ORAL
  Filled 2022-11-28 (×7): qty 2

## 2022-11-28 MED ORDER — ACETAMINOPHEN 10 MG/ML IV SOLN: 1000.0000 mg | Freq: Once | INTRAVENOUS | Status: DC | PRN

## 2022-11-28 MED ORDER — ROCURONIUM BROMIDE 10 MG/ML (PF) SYRINGE
PREFILLED_SYRINGE | INTRAVENOUS | Status: AC
  Filled 2022-11-28: qty 10

## 2022-11-28 MED ORDER — TRANEXAMIC ACID-NACL 1000-0.7 MG/100ML-% IV SOLN
INTRAVENOUS | Status: AC
  Filled 2022-11-28: qty 100

## 2022-11-28 MED ORDER — ROCURONIUM BROMIDE 100 MG/10ML IV SOLN
INTRAVENOUS | Status: DC | PRN
  Administered 2022-11-28: 50 mg via INTRAVENOUS
  Administered 2022-11-28: 10 mg via INTRAVENOUS
  Administered 2022-11-28: 20 mg via INTRAVENOUS
  Administered 2022-11-28: 30 mg via INTRAVENOUS

## 2022-11-28 MED ORDER — CEFAZOLIN SODIUM-DEXTROSE 2-4 GM/100ML-% IV SOLN
INTRAVENOUS | Status: AC
  Filled 2022-11-28: qty 100

## 2022-11-28 MED ORDER — FENTANYL CITRATE (PF) 100 MCG/2ML IJ SOLN
INTRAMUSCULAR | Status: AC
  Filled 2022-11-28: qty 2

## 2022-11-28 MED ORDER — OXYCODONE HCL 5 MG PO TABS: 2.5000 mg | ORAL_TABLET | ORAL | Status: DC | PRN

## 2022-11-28 MED ORDER — HYDROMORPHONE HCL 1 MG/ML IJ SOLN
0.2000 mg | INTRAMUSCULAR | Status: DC | PRN
  Administered 2022-11-28 – 2022-11-29 (×2): 0.2 mg via INTRAVENOUS
  Filled 2022-11-28 (×2): qty 1

## 2022-11-28 MED ORDER — SENNOSIDES-DOCUSATE SODIUM 8.6-50 MG PO TABS: 1.0000 | ORAL_TABLET | Freq: Every evening | ORAL | Status: DC | PRN

## 2022-11-28 MED ORDER — DEXAMETHASONE SODIUM PHOSPHATE 10 MG/ML IJ SOLN
INTRAMUSCULAR | Status: DC | PRN
  Administered 2022-11-28: 5 mg via INTRAVENOUS

## 2022-11-28 MED ORDER — CEFAZOLIN SODIUM-DEXTROSE 2-4 GM/100ML-% IV SOLN
2.0000 g | Freq: Four times a day (QID) | INTRAVENOUS | Status: AC
  Administered 2022-11-28 (×2): 2 g via INTRAVENOUS
  Filled 2022-11-28 (×2): qty 100

## 2022-11-28 MED ORDER — CHLORHEXIDINE GLUCONATE CLOTH 2 % EX PADS
6.0000 | MEDICATED_PAD | Freq: Every day | CUTANEOUS | Status: DC
  Administered 2022-11-28 – 2022-12-04 (×7): 6 via TOPICAL

## 2022-11-28 MED ORDER — SENNA 8.6 MG PO TABS
1.0000 | ORAL_TABLET | Freq: Two times a day (BID) | ORAL | Status: DC
  Filled 2022-11-28: qty 1

## 2022-11-28 MED ORDER — ONDANSETRON HCL 4 MG PO TABS: 4.0000 mg | ORAL_TABLET | Freq: Four times a day (QID) | ORAL | Status: DC | PRN

## 2022-11-28 MED ORDER — DEXAMETHASONE SODIUM PHOSPHATE 10 MG/ML IJ SOLN
INTRAMUSCULAR | Status: AC
  Filled 2022-11-28: qty 1

## 2022-11-28 MED ORDER — TRANEXAMIC ACID-NACL 1000-0.7 MG/100ML-% IV SOLN
1000.0000 mg | Freq: Once | INTRAVENOUS | Status: AC
  Administered 2022-11-28: 1000 mg via INTRAVENOUS
  Filled 2022-11-28: qty 100

## 2022-11-28 MED ORDER — PROPOFOL 10 MG/ML IV BOLUS
INTRAVENOUS | Status: DC | PRN
  Administered 2022-11-28: 80 mg via INTRAVENOUS

## 2022-11-28 MED ORDER — TRAMADOL HCL 50 MG PO TABS
50.0000 mg | ORAL_TABLET | Freq: Four times a day (QID) | ORAL | Status: DC | PRN
  Administered 2022-11-28 – 2022-12-03 (×10): 50 mg via ORAL
  Filled 2022-11-28 (×10): qty 1

## 2022-11-28 MED ORDER — FENTANYL CITRATE (PF) 100 MCG/2ML IJ SOLN: 25.0000 ug | INTRAMUSCULAR | Status: DC | PRN

## 2022-11-28 MED ORDER — ONDANSETRON HCL 4 MG/2ML IJ SOLN
INTRAMUSCULAR | Status: DC | PRN
  Administered 2022-11-28: 4 mg via INTRAVENOUS

## 2022-11-28 MED ORDER — ACETAMINOPHEN 500 MG PO TABS
1000.0000 mg | ORAL_TABLET | Freq: Three times a day (TID) | ORAL | Status: DC
  Administered 2022-11-28 – 2022-12-04 (×16): 1000 mg via ORAL
  Filled 2022-11-28 (×18): qty 2

## 2022-11-28 MED ORDER — ASPIRIN 325 MG PO TBEC
325.0000 mg | DELAYED_RELEASE_TABLET | Freq: Every day | ORAL | Status: DC
  Administered 2022-11-29 – 2022-12-04 (×6): 325 mg via ORAL
  Filled 2022-11-28 (×6): qty 1

## 2022-11-28 MED ORDER — ACETAMINOPHEN 10 MG/ML IV SOLN
INTRAVENOUS | Status: AC
  Filled 2022-11-28: qty 100

## 2022-11-28 MED ORDER — CEFAZOLIN SODIUM-DEXTROSE 2-3 GM-%(50ML) IV SOLR
INTRAVENOUS | Status: DC | PRN
  Administered 2022-11-28: 2 g via INTRAVENOUS

## 2022-11-28 MED ORDER — DOCUSATE SODIUM 100 MG PO CAPS
100.0000 mg | ORAL_CAPSULE | Freq: Two times a day (BID) | ORAL | Status: DC
  Administered 2022-11-29 – 2022-12-04 (×8): 100 mg via ORAL
  Filled 2022-11-28 (×12): qty 1

## 2022-11-28 MED ORDER — LIDOCAINE HCL (PF) 2 % IJ SOLN
INTRAMUSCULAR | Status: AC
  Filled 2022-11-28: qty 5

## 2022-11-28 MED ORDER — PHENYLEPHRINE HCL-NACL 20-0.9 MG/250ML-% IV SOLN
INTRAVENOUS | Status: AC
  Filled 2022-11-28: qty 250

## 2022-11-28 MED ORDER — LIDOCAINE HCL (CARDIAC) PF 100 MG/5ML IV SOSY
PREFILLED_SYRINGE | INTRAVENOUS | Status: DC | PRN
  Administered 2022-11-28: 100 mg via INTRAVENOUS

## 2022-11-28 MED ORDER — FENTANYL CITRATE (PF) 100 MCG/2ML IJ SOLN
INTRAMUSCULAR | Status: DC | PRN
  Administered 2022-11-28 (×5): 25 ug via INTRAVENOUS

## 2022-11-28 MED ORDER — TRANEXAMIC ACID-NACL 1000-0.7 MG/100ML-% IV SOLN
INTRAVENOUS | Status: DC | PRN
  Administered 2022-11-28 (×2): 1000 mg via INTRAVENOUS

## 2022-11-28 MED ORDER — PHENYLEPHRINE HCL-NACL 20-0.9 MG/250ML-% IV SOLN
INTRAVENOUS | Status: DC | PRN
  Administered 2022-11-28: 25 ug/min via INTRAVENOUS

## 2022-11-28 MED ORDER — ONDANSETRON HCL 4 MG/2ML IJ SOLN: 4.0000 mg | Freq: Four times a day (QID) | INTRAMUSCULAR | Status: DC | PRN

## 2022-11-28 MED ORDER — CLOPIDOGREL BISULFATE 75 MG PO TABS
75.0000 mg | ORAL_TABLET | Freq: Every day | ORAL | Status: DC
  Administered 2022-11-29 – 2022-12-04 (×6): 75 mg via ORAL
  Filled 2022-11-28 (×6): qty 1

## 2022-11-28 MED ORDER — NEOMYCIN-POLYMYXIN B GU 40-200000 IR SOLN
Status: DC | PRN
  Administered 2022-11-28: 16 mL

## 2022-11-28 MED ORDER — METOCLOPRAMIDE HCL 5 MG PO TABS: 5.0000 mg | ORAL_TABLET | Freq: Three times a day (TID) | ORAL | Status: DC | PRN

## 2022-11-28 MED ORDER — FLEET ENEMA 7-19 GM/118ML RE ENEM: 1.0000 | ENEMA | Freq: Once | RECTAL | Status: DC | PRN

## 2022-11-28 MED ORDER — ONDANSETRON HCL 4 MG/2ML IJ SOLN
INTRAMUSCULAR | Status: AC
  Filled 2022-11-28: qty 2

## 2022-11-28 MED ORDER — SUGAMMADEX SODIUM 200 MG/2ML IV SOLN: INTRAVENOUS | Status: DC | PRN

## 2022-11-28 MED ORDER — ACETAMINOPHEN 10 MG/ML IV SOLN
INTRAVENOUS | Status: DC | PRN
  Administered 2022-11-28: 1000 mg via INTRAVENOUS

## 2022-11-28 MED ORDER — LACTATED RINGERS IV SOLN: INTRAVENOUS | Status: DC | PRN

## 2022-11-28 MED ORDER — BUPIVACAINE LIPOSOME 1.3 % IJ SUSP
INTRAMUSCULAR | Status: DC | PRN
  Administered 2022-11-28: 50 mL

## 2022-11-28 MED ORDER — SODIUM CHLORIDE 0.9 % IV SOLN: INTRAVENOUS | Status: DC

## 2022-11-28 SURGICAL SUPPLY — 81 items
ADH SKN CLS APL DERMABOND .7 (GAUZE/BANDAGES/DRESSINGS) ×1
ADH SKN CLS LQ APL DERMABOND (GAUZE/BANDAGES/DRESSINGS) ×1
APL PRP STRL LF DISP 70% ISPRP (MISCELLANEOUS) ×2
BLADE SAW SGTL 13X75X1.27 (BLADE) ×1 IMPLANT
BNDG CMPR 5X4 CHSV STRCH STRL (GAUZE/BANDAGES/DRESSINGS) ×1
BNDG COHESIVE 4X5 TAN STRL LF (GAUZE/BANDAGES/DRESSINGS) ×1 IMPLANT
BOWL CEMENT MIXING ADV NOZZLE (MISCELLANEOUS) ×1 IMPLANT
CEMENT BONE 10-PACK (Cement) ×2 IMPLANT
CHLORAPREP W/TINT 26 (MISCELLANEOUS) ×2 IMPLANT
COVER BACK TABLE REUSABLE LG (DRAPES) ×1 IMPLANT
COVER MAYO STAND STRL (DRAPES) ×1 IMPLANT
Cement spacer IMPLANT
DERMABOND ADVANCED .7 DNX12 (GAUZE/BANDAGES/DRESSINGS) ×1 IMPLANT
DERMABOND ADVANCED .7 DNX6 (GAUZE/BANDAGES/DRESSINGS) IMPLANT
DRAPE 3/4 80X56 (DRAPES) ×1 IMPLANT
DRAPE INCISE IOBAN 66X60 STRL (DRAPES) ×1 IMPLANT
DRAPE ORTHO SPLIT 77X108 STRL (DRAPES) ×1
DRAPE SURG ORHT 6 SPLT 77X108 (DRAPES) ×1 IMPLANT
DRAPE U-SHAPE 47X51 STRL (DRAPES) ×1 IMPLANT
DRSG MEPILEX SACRM 8.7X9.8 (GAUZE/BANDAGES/DRESSINGS) IMPLANT
DRSG OPSITE POSTOP 4X10 (GAUZE/BANDAGES/DRESSINGS) IMPLANT
DRSG OPSITE POSTOP 4X12 (GAUZE/BANDAGES/DRESSINGS) IMPLANT
DRSG OPSITE POSTOP 4X8 (GAUZE/BANDAGES/DRESSINGS) IMPLANT
DRSG XEROFORM 1X8 (GAUZE/BANDAGES/DRESSINGS) IMPLANT
ELECT CAUTERY BLADE 6.4 (BLADE) IMPLANT
ELECT REM PT RETURN 9FT ADLT (ELECTROSURGICAL) ×1
ELECTRODE REM PT RTRN 9FT ADLT (ELECTROSURGICAL) ×1 IMPLANT
GAUZE SPONGE 4X4 12PLY STRL (GAUZE/BANDAGES/DRESSINGS) IMPLANT
GAUZE XEROFORM 1X8 LF (GAUZE/BANDAGES/DRESSINGS) ×1 IMPLANT
GLOVE BIOGEL PI IND STRL 8 (GLOVE) ×2 IMPLANT
GLOVE SKINSENSE STRL SZ8.0 LF (GLOVE) ×1 IMPLANT
GLOVE SURG ORTHO 8.0 STRL STRW (GLOVE) ×2 IMPLANT
GOWN STRL REUS W/ TWL LRG LVL3 (GOWN DISPOSABLE) ×1 IMPLANT
GOWN STRL REUS W/ TWL XL LVL3 (GOWN DISPOSABLE) ×1 IMPLANT
GOWN STRL REUS W/TWL LRG LVL3 (GOWN DISPOSABLE) ×1
GOWN STRL REUS W/TWL XL LVL3 (GOWN DISPOSABLE) ×1
HEAD MODULAR ENDO (Orthopedic Implant) ×1 IMPLANT
HEAD UNPLR 53XMDLR STRL HIP (Orthopedic Implant) IMPLANT
HEMOVAC 400ML (MISCELLANEOUS)
HOOD PEEL AWAY T7 (MISCELLANEOUS) ×2 IMPLANT
IMMBOLIZER KNEE 19 BLUE UNIV (SOFTGOODS) ×1 IMPLANT
IV NS IRRIG 3000ML ARTHROMATIC (IV SOLUTION) ×1 IMPLANT
KIT DRAIN HEMOVAC JP 7FR 400ML (MISCELLANEOUS) IMPLANT
KIT PREP HIP W/CEMENT RESTRICT (Miscellaneous) ×1 IMPLANT
KIT PREPARATION TOTAL HIP (Miscellaneous) ×1 IMPLANT
KIT TURNOVER KIT A (KITS) ×1 IMPLANT
MANIFOLD NEPTUNE II (INSTRUMENTS) ×2 IMPLANT
MAT GRAY ABSORB FLUID 28X50 (MISCELLANEOUS) IMPLANT
NDL FILTER BLUNT 18X1 1/2 (NEEDLE) ×1 IMPLANT
NDL HYPO 22X1.5 SAFETY MO (MISCELLANEOUS) ×1 IMPLANT
NDL MAYO CATGUT SZ1 (NEEDLE) ×1 IMPLANT
NDL SAFETY ECLIP 18X1.5 (MISCELLANEOUS) ×1 IMPLANT
NEEDLE FILTER BLUNT 18X1 1/2 (NEEDLE) ×1 IMPLANT
NEEDLE HYPO 22X1.5 SAFETY MO (MISCELLANEOUS) ×1 IMPLANT
NEEDLE MAYO CATGUT SZ1 (NEEDLE) ×1 IMPLANT
NS IRRIG 1000ML POUR BTL (IV SOLUTION) ×1 IMPLANT
PACK HIP PROSTHESIS (MISCELLANEOUS) ×1 IMPLANT
PENCIL SMOKE EVACUATOR (MISCELLANEOUS) ×1 IMPLANT
PILLOW ABDUCTION MEDIUM (MISCELLANEOUS) IMPLANT
PULSAVAC PLUS IRRIG FAN TIP (DISPOSABLE) ×1
SLEEVE UNITRAX V40 (Orthopedic Implant) ×1 IMPLANT
SLEEVE UNITRAX V40 +4 (Orthopedic Implant) IMPLANT
SPONGE T-LAP 18X18 ~~LOC~~+RFID (SPONGE) ×2 IMPLANT
STAPLER SKIN PROX 35W (STAPLE) ×1 IMPLANT
STEM FEM CEMT 49X158 SZ6 127D (Stem) IMPLANT
SUT ETHIBOND #5 BRAIDED 30INL (SUTURE) ×1 IMPLANT
SUT MNCRL 4-0 (SUTURE) ×1
SUT MNCRL 4-0 27XMFL (SUTURE) ×1
SUT VIC AB 0 CT1 36 (SUTURE) ×1 IMPLANT
SUT VIC AB 2-0 CT2 27 (SUTURE) ×2 IMPLANT
SUTURE MNCRL 4-0 27XMF (SUTURE) ×1 IMPLANT
SYR 20ML LL LF (SYRINGE) ×2 IMPLANT
SYR 50ML LL SCALE MARK (SYRINGE) ×1 IMPLANT
TAPE MICROFOAM 4IN (TAPE) ×1 IMPLANT
TIP BRUSH PULSAVAC PLUS 24.33 (MISCELLANEOUS) ×1 IMPLANT
TIP FAN IRRIG PULSAVAC PLUS (DISPOSABLE) ×1 IMPLANT
TRAP FLUID SMOKE EVACUATOR (MISCELLANEOUS) ×1 IMPLANT
TUBE KAMVAC SUCTION (TUBING) ×1 IMPLANT
TUBE SUCT KAM VAC (TUBING) ×1 IMPLANT
WATER STERILE IRR 500ML POUR (IV SOLUTION) ×1 IMPLANT
cement spacer IMPLANT

## 2022-11-28 NOTE — Transfer of Care (Signed)
Immediate Anesthesia Transfer of Care Note  Patient: Adam Rios.  Procedure(s) Performed: ARTHROPLASTY BIPOLAR HIP (HEMIARTHROPLASTY) (Left: Hip)  Patient Location: PACU  Anesthesia Type:General  Level of Consciousness: drowsy  Airway & Oxygen Therapy: Patient Spontanous Breathing and Patient connected to face mask oxygen  Post-op Assessment: Report given to RN and Post -op Vital signs reviewed and stable  Post vital signs: Reviewed and stable  Last Vitals:  Vitals Value Taken Time  BP 128/92 11/28/22 1100  Temp    Pulse 103 11/28/22 1101  Resp 17 11/28/22 1101  SpO2 99 % 11/28/22 1101  Vitals shown include unvalidated device data.  Last Pain:  Vitals:   11/28/22 0720  TempSrc:   PainSc: Asleep         Complications: No notable events documented.

## 2022-11-28 NOTE — Op Note (Signed)
DATE OF SURGERY: 11/28/2022  PREOPERATIVE DIAGNOSIS: Left femoral neck fracture  POSTOPERATIVE DIAGNOSIS: Left femoral neck fracture  PROCEDURE: Left hip hemiarthroplasty  SURGEON: Rosealee Albee, MD  ASSISTANTS:   EBL: 300 cc  COMPONENTS:  Stryker - Accolade C Size 6 Stem Stryker - Unitrax 53mm head with +4 offset neck   INDICATIONS: Adam Rios. is a 82 y.o. male who sustained a displaced femoral neck fracture after a fall. Risks and benefits of hip hemiarthroplasty were explained to the patient and/or family. Risks include but are not limited to bleeding, infection, injury to tissues, nerves, vessels, periprosthetic infection, dislocation, limb length discrepancy and risks of anesthesia. The patient and/or family understands these risks, has completed an informed consent and wishes to proceed.   PROCEDURE:  The patient was identified in the preoperative holding area and the operative extremity was marked.  The patient was then transferred to the operating room suite and mobilized from the hospital gurney to the operating room table. Spinal anesthesia was administered without complication. The patient was then transitioned to a lateral position.  All bony prominences were padded per protocol.  An axillary roll was placed.  Careful attention was paid to the contralateral side peroneal nerve, which was free from pressure with use of appropriate padding and blankets. A time-out was performed to confirm the patient's identity and the correct laterality of surgery. The patient was then prepped and draped in the usual sterile fashion. Appropriate pre-operative antibiotics were administered. Tranexamic acid was administered preoperatively.    An incision that centered on the posterior tip of the greater trochanter with a posterior curve was made. Dissection was carried down through the subcutaneous tissue.  Careful attention was made to maintain hemostasis using electrocautery.  Dissection  brought [place] to the level of the deep fascia where the gluteus maximus muscle and proximal portion of the IT band were identified.  The proximal region of the IT band was incised in linear fashion and this incision was extended proximally in a curvilinear fashion to split the gluteus maximus muscle parallel to its fibers to minimize bleeding.  This was accomplished using a combination bovie electrocautery as well as blunt dissection.  The trochanteric bursa was then visualized and dissected from anterior to posterior. There was significantly more fatty tissue about the short external rotators, piriformis, and gluteus medius tendons. A blunt homan retractor was placed beneath the abductors. The piriformis tendon and short external rotators were visualized. Bovie electrocautery was used to cut these with the capsule as one L-shaped flap. This was tagged at the corner with #5 Ethibond. At this point, the femoral neck fracture was visualized. An oscillating saw was used to make a new neck cut approximately 15mm above the lesser tuberosity with the use of a neck cut guide. The head was then freed from its remaining soft tissue attachments and measured. The head trial was then inserted into the acetabulum and sized appropriately.    We then turned our attention to preparing the femoral canal. First, a box cut was performed utilizing the box osteotome. A canal finder was inserted by hand and sequential broaching was then performed. The calcar planer was inserted onto the broach and used to smooth the calcar appropriately.  A trial stem, neck, and head were inserted into the acetabulum and placed through range of motion. Intraoperative radiographs were taken to assess hardware position and leg lengths. The hip was again dislocated and the femoral trial components were removed. An appropriately sized cement  restrictor was placed. The canal was irrigated with pulse lavage and dried. A cement gun was used to place cement into  the femoral canal. Cement was then pressurized. The actual stem was inserted into the femoral canal and then driven onto the calcar. Position was held until the cement hardened. The trial head was then again inserted on the femoral component and found to be appropriate. Trial was removed and the permanent head/neck was Morse tapered onto the femoral stem and then reduced into the acetabulum.    The hip stability and length were reassessed and found to be satisfactory.  The wound was then copiously irrigated with normal saline solution. The tagged sutures of the capsule and piriformis were sewn to the gluteus medius tendon. This adequately closed the hip capsule. The IT band and gluteus maximus fascia were then closed with 0-Vicryl in a running, locked fashion. Of note, the IT band was not as robust or stout of a layer as normal. A mixture of Exparel and bupivicaine was administered.  The subdermal layer was closed with 2-0 Vicryl in a buried interrupted fashion. Skin was approximated with staples.  Sterile dressing was applied. An abduction pillow was placed. The patient was mobilized from the lateral position back to supine on the operating room table and then awakened from general anesthesia without complication.   POSTOPERATIVE PLAN: The patient will be WBAT on operative extremity. Resume baseline Plavix and start ASA 325mg /day for DVT ppx. IV Abx x 24 hours. PT/OT on POD#1. Posterior hip precautions.

## 2022-11-28 NOTE — Anesthesia Preprocedure Evaluation (Addendum)
Anesthesia Evaluation  Patient identified by MRN, date of birth, ID band Patient awake    Reviewed: Allergy & Precautions, H&P , NPO status , Patient's Chart, lab work & pertinent test results  History of Anesthesia Complications Negative for: history of anesthetic complications  Airway Mallampati: IV  TM Distance: >3 FB Neck ROM: full    Dental  (+) Poor Dentition, Chipped   Pulmonary neg sleep apnea, neg COPD, Patient abstained from smoking.Not current smoker, former smoker   Pulmonary exam normal breath sounds clear to auscultation       Cardiovascular Exercise Tolerance: Good METShypertension, Pt. on medications + Peripheral Vascular Disease and +CHF  (-) CAD and (-) Past MI + dysrhythmias Atrial Fibrillation  Rhythm:regular Rate:Tachycardia  TTE 09/2022: 1. Consider infiltrative process ie..Amyloid/Sarcoid.   2. Left ventricular ejection fraction, by estimation, is 55 to 60%. The  left ventricle has normal function. The left ventricle has no regional  wall motion abnormalities. There is moderate concentric left ventricular  hypertrophy. Left ventricular  diastolic parameters were normal.   3. Right ventricular systolic function is normal. The right ventricular  size is normal.   4. Left atrial size was moderately dilated.   5. Right atrial size was mild to moderately dilated.   6. The mitral valve is normal in structure. Trivial mitral valve  regurgitation.   7. The aortic valve is grossly normal. Aortic valve regurgitation is  trivial. Aortic valve sclerosis is present, with no evidence of aortic  valve stenosis.      Neuro/Psych CVA, No Residual Symptoms negative neurological ROS  negative psych ROS   GI/Hepatic ,GERD  Medicated and Controlled,,(+)     (-) substance abuse    Endo/Other  neg diabetes    Renal/GU Renal disease     Musculoskeletal   Abdominal   Peds  Hematology   Anesthesia Other  Findings Past Medical History: No date: Cognitive impairment No date: Complication of anesthesia     Comment:  Fentanyl causes nausea No date: Hemorrhagic cerebrovascular accident (CVA) (HCC) No date: Hypertension No date: Neurogenic bladder 2005: Renal cell carcinoma (HCC) No date: Renal disorder No date: Stroke [facility])     Comment:   01/17/18, 12/21   Some weakness and cognitive decline  Reproductive/Obstetrics                             Anesthesia Physical Anesthesia Plan  ASA: 3  Anesthesia Plan: General   Post-op Pain Management: Ofirmev IV (intra-op)* and Gabapentin PO (pre-op)*   Induction: Intravenous  PONV Risk Score and Plan: 1 and 3 and Treatment may vary due to age or medical condition, Dexamethasone and Ondansetron  Airway Management Planned: Oral ETT and Video Laryngoscope Planned  Additional Equipment: None  Intra-op Plan:   Post-operative Plan: Extubation in OR  Informed Consent: I have reviewed the patients History and Physical, chart, labs and discussed the procedure including the risks, benefits and alternatives for the proposed anesthesia with the patient or authorized representative who has indicated his/her understanding and acceptance.     Dental Advisory Given  Plan Discussed with: CRNA  Anesthesia Plan Comments: (Patient took plavix within 7 days, precluding spinal. Discussed risks of anesthesia with patient, including PONV, sore throat, lip/dental/eye damage. Rare risks discussed as well, such as cardiorespiratory and neurological sequelae, and allergic reactions. Discussed the role of CRNA in patient's perioperative care. Patient understands.)        Anesthesia Quick Evaluation

## 2022-11-28 NOTE — Anesthesia Postprocedure Evaluation (Signed)
Anesthesia Post Note  Patient: Adam Rios.  Procedure(s) Performed: ARTHROPLASTY BIPOLAR HIP (HEMIARTHROPLASTY) (Left: Hip)  Patient location during evaluation: PACU Anesthesia Type: General Level of consciousness: awake and alert Pain management: pain level controlled Vital Signs Assessment: post-procedure vital signs reviewed and stable Respiratory status: spontaneous breathing, nonlabored ventilation, respiratory function stable and patient connected to nasal cannula oxygen Cardiovascular status: blood pressure returned to baseline and stable Postop Assessment: no apparent nausea or vomiting Anesthetic complications: no   No notable events documented.   Last Vitals:  Vitals:   11/28/22 1228 11/28/22 1526  BP: 123/74 102/64  Pulse: 89 61  Resp: 16 16  Temp: (!) 36.4 C 36.5 C  SpO2: 93% 98%    Last Pain:  Vitals:   11/28/22 1544  TempSrc:   PainSc: 7                  Corinda Gubler

## 2022-11-28 NOTE — Progress Notes (Signed)
  Progress Note   Patient: Adam Rios. WUJ:[id number] DOB: 20-May-1941 DOA: 11/27/2022     1 DOS: the patient was seen and examined on 11/28/2022   Brief hospital course: Adam Chatel. is a 82 y.o. male with medical history significant for chronic diastolic CHF, recurrent UTI secondary to indwelling catheter, hypertension, neurogenic bladder, A-fib with RVR, PAD, history of stroke, who presents after a fall with left hip pain.  X-ray showed left hip displaced fracture of the left femur neck. Surgery performed 6/29.   Principal Problem:   Closed left hip fracture (HCC) Active Problems:   Chronic diastolic CHF (congestive heart failure) (HCC)   Renal cell carcinoma s/p nephrectomy, left   Essential hypertension   Dyslipidemia   GERD without esophagitis   PAD (peripheral artery disease) (HCC)   History of stroke   Known medical problems   Assessment and Plan: Closed left femoral neck fracture. Mechanical fall. Post left hip hemiarthroplasty 6/29.  Seen patient postop, doing well.  Obtain PT/OT. Patient patient is prior functional status, most likely will need nursing home placement.  Chronic diastolic congestive heart failure Patient euvolemic, no exacerbation.  Essential hypertension.   Continue some home medicines.  History of stroke. Debility. PT and OT eval.    Subjective:  Feels sleepy after surgery.  Pain controlled.  Physical Exam: Vitals:   11/28/22 1102 11/28/22 1115 11/28/22 1130 11/28/22 1145  BP:  124/82 (!) 124/95 115/60  Pulse: (!) 103 (!) 102 (!) 101 96  Resp: 17 15 17 14   Temp:   (!) 97.1 F (36.2 C)   TempSrc:      SpO2: 99% 99% 99% 93%  Weight:      Height:       General exam: Appears calm and comfortable  Respiratory system: Clear to auscultation. Respiratory effort normal. Cardiovascular system: S1 & S2 heard, RRR. No JVD, murmurs, rubs, gallops or clicks. No pedal edema. Gastrointestinal system: Abdomen is nondistended, soft and  nontender. No organomegaly or masses felt. Normal bowel sounds heard. Central nervous system: Alert and oriented x2. No focal neurological deficits. Extremities: Symmetric 5 x 5 power. Skin: No rashes, lesions or ulcers Psychiatry:  Mood & affect appropriate.    Data Reviewed:  X-ray and lab results reviewed.  Family Communication: None  Disposition: Status is: Inpatient Remains inpatient appropriate because: Severity of disease, postop.     Time spent: 35 minutes  Author: Marrion Coy, MD 11/28/2022 12:14 PM  For on call review [email or web address].

## 2022-11-28 NOTE — Anesthesia Procedure Notes (Signed)
Procedure Name: Intubation Date/Time: 11/28/2022 8:24 AM  Performed by: Lynden Oxford, CRNAPre-anesthesia Checklist: Patient identified, Emergency Drugs available, Suction available and Patient being monitored Patient Re-evaluated:Patient Re-evaluated prior to induction Oxygen Delivery Method: Circle system utilized Preoxygenation: Pre-oxygenation with 100% oxygen Induction Type: IV induction Ventilation: Mask ventilation without difficulty Laryngoscope Size: McGraph and 4 Grade View: Grade II Tube type: Oral Tube size: 7.0 mm Number of attempts: 1 Airway Equipment and Method: Stylet, Oral airway and Video-laryngoscopy Placement Confirmation: ETT inserted through vocal cords under direct vision, positive ETCO2 and breath sounds checked- equal and bilateral Secured at: 23 cm Tube secured with: Tape Dental Injury: Teeth and Oropharynx as per pre-operative assessment

## 2022-11-28 NOTE — Hospital Course (Signed)
Adam Rios. is a 82 y.o. male with medical history significant for chronic diastolic CHF, recurrent UTI secondary to indwelling catheter, hypertension, neurogenic bladder, A-fib with RVR, PAD, history of stroke, who presents after a fall with left hip pain.  X-ray showed left hip displaced fracture of the left femur neck. Surgery performed 6/29. Patient states that he had a recent increased weakness, some urinary symptoms.  Urine culture grow gram negative rods, history of ESBL, started meropenem on 6/30.

## 2022-11-28 NOTE — Progress Notes (Signed)
OT Cancellation Note  Patient Details Name: Adam Rios. MRN: [id number] DOB: [birth year]   Cancelled Treatment:    Reason Eval/Treat Not Completed: Other (comment) (per chart pt is scheudled for surgery on this date. Pt will need new OT orders when approrpiate.) Oleta Mouse, OTD OTR/L  11/28/22, 8:20 AM

## 2022-11-28 NOTE — H&P (Signed)
H&P reviewed. No significant changes noted.  

## 2022-11-29 DIAGNOSIS — I5032 Chronic diastolic (congestive) heart failure: Secondary | ICD-10-CM | POA: Diagnosis not present

## 2022-11-29 DIAGNOSIS — N390 Urinary tract infection, site not specified: Secondary | ICD-10-CM | POA: Diagnosis not present

## 2022-11-29 DIAGNOSIS — D62 Acute posthemorrhagic anemia: Secondary | ICD-10-CM | POA: Insufficient documentation

## 2022-11-29 DIAGNOSIS — T83511A Infection and inflammatory reaction due to indwelling urethral catheter, initial encounter: Secondary | ICD-10-CM

## 2022-11-29 DIAGNOSIS — S72002A Fracture of unspecified part of neck of left femur, initial encounter for closed fracture: Secondary | ICD-10-CM | POA: Diagnosis not present

## 2022-11-29 LAB — BASIC METABOLIC PANEL
Anion gap: 9 (ref 5–15)
BUN: 24 mg/dL — ABNORMAL HIGH (ref 8–23)
CO2: 22 mmol/L (ref 22–32)
Calcium: 8.1 mg/dL — ABNORMAL LOW (ref 8.9–10.3)
Chloride: 101 mmol/L (ref 98–111)
Creatinine, Ser: 1.21 mg/dL (ref 0.61–1.24)
GFR, Estimated: 60 mL/min — ABNORMAL LOW (ref >60)
Glucose, Bld: 149 mg/dL — ABNORMAL HIGH (ref 70–99)
Potassium: 4.4 mmol/L (ref 3.5–5.1)
Sodium: 132 mmol/L — ABNORMAL LOW (ref 135–145)

## 2022-11-29 LAB — MAGNESIUM: Magnesium: 1.7 mg/dL (ref 1.7–2.4)

## 2022-11-29 LAB — CBC
HCT: 27.6 % — ABNORMAL LOW (ref 39.0–52.0)
Hemoglobin: 8.7 g/dL — ABNORMAL LOW (ref 13.0–17.0)
MCH: 26.6 pg (ref 26.0–34.0)
MCHC: 31.5 g/dL (ref 30.0–36.0)
MCV: 84.4 fL (ref 80.0–100.0)
Platelets: 153 10*3/uL (ref 150–400)
RBC: 3.27 MIL/uL — ABNORMAL LOW (ref 4.22–5.81)
RDW: 15.1 % (ref 11.5–15.5)
WBC: 15.2 10*3/uL — ABNORMAL HIGH (ref 4.0–10.5)
nRBC: 0 % (ref 0.0–0.2)

## 2022-11-29 LAB — URINE CULTURE

## 2022-11-29 MED ORDER — OXYCODONE HCL 5 MG PO TABS: 2.5000 mg | ORAL_TABLET | ORAL | 0 refills | Status: DC | PRN

## 2022-11-29 MED ORDER — SENNOSIDES-DOCUSATE SODIUM 8.6-50 MG PO TABS
2.0000 | ORAL_TABLET | Freq: Two times a day (BID) | ORAL | Status: DC
  Administered 2022-11-29 – 2022-12-04 (×8): 2 via ORAL
  Filled 2022-11-29 (×11): qty 2

## 2022-11-29 MED ORDER — HYDROMORPHONE HCL 1 MG/ML IJ SOLN
0.2000 mg | INTRAMUSCULAR | Status: DC | PRN
Start: 2022-11-29 — End: 2022-11-29

## 2022-11-29 MED ORDER — SODIUM CHLORIDE 0.9 % IV SOLN
1.0000 g | Freq: Three times a day (TID) | INTRAVENOUS | Status: DC
  Administered 2022-11-29 – 2022-11-30 (×3): 1 g via INTRAVENOUS
  Filled 2022-11-29 (×4): qty 20

## 2022-11-29 MED ORDER — HYDROMORPHONE HCL 1 MG/ML IJ SOLN
0.2000 mg | INTRAMUSCULAR | Status: DC | PRN
  Administered 2022-11-29: 0.4 mg via INTRAVENOUS
  Filled 2022-11-29: qty 0.5

## 2022-11-29 MED ORDER — ALUM & MAG HYDROXIDE-SIMETH 200-200-20 MG/5ML PO SUSP
30.0000 mL | Freq: Four times a day (QID) | ORAL | Status: DC | PRN
  Administered 2022-11-29: 30 mL via ORAL
  Filled 2022-11-29: qty 30

## 2022-11-29 MED ORDER — HYDROMORPHONE HCL 1 MG/ML IJ SOLN: 0.2000 mg | INTRAMUSCULAR | Status: DC | PRN

## 2022-11-29 MED ORDER — TRAMADOL HCL 50 MG PO TABS: 50.0000 mg | ORAL_TABLET | Freq: Four times a day (QID) | ORAL | 0 refills | Status: DC | PRN

## 2022-11-29 MED ORDER — SODIUM CHLORIDE 0.9 % IV SOLN
1.0000 g | INTRAVENOUS | Status: DC
  Filled 2022-11-29: qty 10

## 2022-11-29 NOTE — NC FL2 (Signed)
[place] MEDICAID FL2 LEVEL OF CARE FORM     IDENTIFICATION  Patient Name: Adam Rios. Birthdate: [birth year] Sex: male Admission Date (Current Location): 11/27/2022  [facility] and [place] Number:  Chiropodist and Address:  [facility], [address], [place], [place] [address]      Provider Number: [id number]  Attending Physician Name and Address:  Marrion Coy, MD  Relative Name and Phone Number:  BARTOLOME, DUROCHER (Daughter) [phone] (Mobile)    Current Level of Care: Hospital Recommended Level of Care: Skilled Nursing Facility Prior Approval Number:    Date Approved/Denied:   PASRR Number: [id number]  Discharge Plan:      Current Diagnoses: Patient Active Problem List   Diagnosis Date Noted   Acute postoperative anemia due to expected blood loss 11/29/2022   Overweight (BMI 25.0-29.9) 11/28/2022   Hyponatremia 11/28/2022   Metabolic acidosis 11/28/2022   Closed left hip fracture (HCC) 11/27/2022   Known medical problems 11/27/2022   Acute metabolic encephalopathy 09/29/2022   Atrial fibrillation with RVR (HCC) 09/28/2022   PAD (peripheral artery disease) (HCC) 09/28/2022   History of stroke 09/28/2022   Sepsis secondary to UTI (HCC) 09/28/2022   Generalized weakness 08/23/2022   Hypokalemia 08/14/2022   AKI (acute kidney injury) (HCC) 08/09/2022   Leukocytosis 08/09/2022   Severe sepsis (HCC) 08/09/2022   Heart failure with preserved ejection fraction (HCC) 08/09/2022   Acute hemorrhagic cystitis 06/11/2022   Iron deficiency anemia due to chronic blood loss 06/11/2022   Dyslipidemia 06/11/2022   GERD without esophagitis 06/11/2022   Lower extremity cellulitis 06/11/2022   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   Second degree heart block 05/02/2022   Acute on chronic congestive heart failure (HCC) 04/30/2022   Chronic diastolic CHF (congestive heart failure) (HCC) 04/30/2022   Obesity (BMI 30-39.9) 04/30/2022    Chronic indwelling Foley catheter 04/30/2022   Septic shock (HCC) 02/11/2022   Pressure injury of skin 02/11/2022   Bacteremia 11/22/2021   Hematuria 11/21/2021   Renal cell carcinoma s/p nephrectomy, left 11/21/2021   History of recurrent UTI and ESBL UTI  11/21/2021   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   UTI (urinary tract infection) 09/09/2021   Neuropathy 09/09/2021   History of CVA (cerebrovascular accident) 09/09/2021   Fall 05/14/2021   Frailty 03/04/2018   Acute encephalopathy 01/30/2018   GERD (gastroesophageal reflux disease) 04/05/2017   Pain 04/05/2017   Weakness 04/05/2017   Recurrent UTI 01/20/2016   Essential hypertension 07/31/2014   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 04/10/2014   Ventral hernia without obstruction or gangrene 04/10/2014   Acute low back pain without sciatica 12/26/2013   Fusion of spine of lumbar region 12/26/2013   Anasarca 09/26/2013   Hemorrhoid 09/26/2013   Neurogenic bladder 09/26/2013   Renal cell cancer (HCC) 09/26/2013   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   Lower urinary tract infectious disease 10/28/2012    Orientation RESPIRATION BLADDER Height & Weight     Self, Time, Situation, Place  Normal Incontinent, Indwelling catheter Weight: 223 lb (101.2 kg) Height:  6\' 1"  (185.4 cm)  BEHAVIORAL SYMPTOMS/MOOD NEUROLOGICAL BOWEL NUTRITION STATUS      Incontinent Diet  AMBULATORY STATUS COMMUNICATION OF NEEDS Skin   Extensive Assist Verbally Skin abrasions, Bruising (L hip, L elbow, R elbow)                       Personal Care Assistance Level of Assistance  Bathing, Feeding, Dressing Bathing  Assistance: Maximum assistance Feeding assistance: Limited assistance Dressing Assistance: Maximum assistance     Functional Limitations Info             SPECIAL CARE FACTORS FREQUENCY  PT (By licensed PT), OT (By licensed OT)     PT Frequency: 5 times per week OT Frequency: 5 times per week             Contractures      Additional Factors Info  Code Status, Allergies Code Status Info: full Allergies Info: Empagliflozin, Fentanyl, Oxycodone           Current Medications (11/29/2022):  This is the current hospital active medication list Current Facility-Administered Medications  Medication Dose Route Frequency Provider Last Rate Last Admin   acetaminophen (TYLENOL) tablet 1,000 mg  1,000 mg Oral Q8H Signa Kell, MD   1,000 mg at 11/29/22 1338   alum & mag hydroxide-simeth (MAALOX/MYLANTA) 200-200-20 MG/5ML suspension 30 mL  30 mL Oral Q6H PRN Marrion Coy, MD   30 mL at 11/29/22 1224   aspirin EC tablet 325 mg  325 mg Oral Daily Signa Kell, MD   325 mg at 11/29/22 0933   atorvastatin (LIPITOR) tablet 40 mg  40 mg Oral Daily Signa Kell, MD   40 mg at 11/29/22 6045   bisacodyl (DULCOLAX) suppository 10 mg  10 mg Rectal Daily PRN Signa Kell, MD       Chlorhexidine Gluconate Cloth 2 % PADS 6 each  6 each Topical Daily Marrion Coy, MD   6 each at 11/28/22 2140   clopidogrel (PLAVIX) tablet 75 mg  75 mg Oral Daily Signa Kell, MD   75 mg at 11/29/22 4098   docusate sodium (COLACE) capsule 100 mg  100 mg Oral BID Signa Kell, MD   100 mg at 11/29/22 0935   finasteride (PROSCAR) tablet 5 mg  5 mg Oral Daily Signa Kell, MD   5 mg at 11/29/22 1191   gabapentin (NEURONTIN) capsule 300 mg  300 mg Oral QHS Signa Kell, MD   300 mg at 11/28/22 2140   HYDROmorphone (DILAUDID) injection 0.2-0.4 mg  0.2-0.4 mg Intravenous Q4H PRN Signa Kell, MD   0.2 mg at 11/29/22 0151   menthol-cetylpyridinium (CEPACOL) lozenge 3 mg  1 lozenge Oral PRN Signa Kell, MD       Or   phenol (CHLORASEPTIC) mouth spray 1 spray  1 spray Mouth/Throat PRN Signa Kell, MD       meropenem (MERREM) 1 g in sodium chloride 0.9 % 100 mL IVPB  1 g Intravenous Q8H Ronnald Ramp, RPH 200 mL/hr at 11/29/22 1147 1 g at 11/29/22 1147   metoCLOPramide (REGLAN) tablet 5-10 mg  5-10 mg Oral Q8H PRN Signa Kell, MD        Or   metoCLOPramide (REGLAN) injection 5-10 mg  5-10 mg Intravenous Q8H PRN Signa Kell, MD       metoprolol succinate (TOPROL-XL) 24 hr tablet 12.5 mg  12.5 mg Oral Daily Signa Kell, MD   12.5 mg at 11/29/22 0933   mirabegron ER (MYRBETRIQ) tablet 50 mg  50 mg Oral Daily Signa Kell, MD   50 mg at 11/29/22 0935   ondansetron (ZOFRAN) tablet 4 mg  4 mg Oral Q6H PRN Signa Kell, MD       Or   ondansetron [facility]) injection 4 mg  4 mg Intravenous Q6H PRN Signa Kell, MD       oxyCODONE (Oxy IR/ROXICODONE) immediate release tablet 2.5-5 mg  2.5-5 mg Oral Q4H PRN Signa Kell, MD       oxyCODONE (Oxy IR/ROXICODONE) immediate release tablet 5-10 mg  5-10 mg Oral Q4H PRN Signa Kell, MD       pantoprazole (PROTONIX) EC tablet 20 mg  20 mg Oral Daily Signa Kell, MD   20 mg at 11/29/22 0935   senna-docusate (Senokot-S) tablet 2 tablet  2 tablet Oral BID Marrion Coy, MD   2 tablet at 11/29/22 5284   sodium phosphate (FLEET) 7-19 GM/118ML enema 1 enema  1 enema Rectal Once PRN Signa Kell, MD       tamsulosin [place]) capsule 0.4 mg  0.4 mg Oral QPC breakfast Signa Kell, MD   0.4 mg at 11/29/22 0933   torsemide (DEMADEX) tablet 40 mg  40 mg Oral Daily Marrion Coy, MD   40 mg at 11/29/22 0933   traMADol (ULTRAM) tablet 50 mg  50 mg Oral Q6H PRN Signa Kell, MD   50 mg at 11/29/22 1338     Discharge Medications: Please see discharge summary for a list of discharge medications.  Relevant Imaging Results:  Relevant Lab Results:   Additional Information SS #: [id number]  Linetta Regner E Mattson Dayal, LCSW

## 2022-11-29 NOTE — Evaluation (Signed)
Occupational Therapy Evaluation Patient Details Name: Adam Rios. MRN: [id number] DOB: [birth year] Today's Date: 11/29/2022   History of Present Illness Pt is an 82 year old male s/p  left hip hemiarthroplasty 6/29 after mechanical fall; Pmh significant for chronic diastolic CHF, recurrent UTI secondary to indwelling catheter, hypertension, neurogenic bladder, A-fib with RVR, PAD, history of stroke   Clinical Impression   Chart reviewed, pt performing bed morbility with PT as OT enters room for OT evaluation. Pt with poor tolerance for further activity on this date. Pt presents with deficits in processing and safety awareness, requiring frequent vs for precautions. Pt is a ?historian. ,Per chart, pt required assist for ADL/IADL, amb with rollator with a decrease in general function over the last few months. Pt presents with deficits in strength, endurance, activity tolerance, cognition, balance all affecting safe and optimal ADL completion. Pt wold benefit from post acute OT to address functional deficits.      Recommendations for follow up therapy are one component of a multi-disciplinary discharge planning process, led by the attending physician.  Recommendations may be updated based on patient status, additional functional criteria and insurance authorization.   Assistance Recommended at Discharge Frequent or constant Supervision/Assistance  Patient can return home with the following Two people to help with bathing/dressing/bathroom;Two people to help with walking and/or transfers    Functional Status Assessment  Patient has had a recent decline in their functional status and demonstrates the ability to make significant improvements in function in a reasonable and predictable amount of time.  Equipment Recommendations  Other (comment) (per next venue of care)    Recommendations for Other Services       Precautions / Restrictions Precautions Precautions: Fall;Posterior  Hip Precaution Booklet Issued: No Precaution Comments: PT reviewed posterior hip precautions with pt multiple times throughout session; OT present at end of session and pt only able to recall 1/3 precs. Required Braces or Orthoses: Other Brace Other Brace: hip abduction pillow at rest Restrictions Weight Bearing Restrictions: Yes LLE Weight Bearing: Weight bearing as tolerated      Mobility Bed Mobility Overal bed mobility: Needs Assistance Bed Mobility: Sit to Supine       Sit to supine: Max assist (observed pt completing with PT as OT walked into room)   General bed mobility comments: TOTAL A+2 for boost up bed    Transfers                   General transfer comment: anticipate +2 for attempts at standing      Balance Overall balance assessment: Needs assistance, History of Falls Sitting-balance support: Feet supported, Single extremity supported, Bilateral upper extremity supported Sitting balance-Leahy Scale: Poor                                     ADL either performed or assessed with clinical judgement   ADL Overall ADL's : Needs assistance/impaired Eating/Feeding: Set up;Minimal assistance;Sitting Eating/Feeding Details (indicate cue type and reason): difficulties with package/containers Grooming: Wash/dry face;Sitting;Set up           Upper Body Dressing : Moderate assistance Upper Body Dressing Details (indicate cue type and reason): anticipated Lower Body Dressing: Maximal assistance;Total assistance       Toileting- Clothing Manipulation and Hygiene: Maximal assistance;Total assistance Toileting - Clothing Manipulation Details (indicate cue type and reason): chronic foley, anticipate for BM  Vision Patient Visual Report: No change from baseline       Perception     Praxis      Pertinent Vitals/Pain Pain Assessment Pain Assessment: 0-10 Pain Score: 7  Pain Location: L hip Pain Descriptors /  Indicators: Grimacing, Sore Pain Intervention(s): Limited activity within patient's tolerance, Monitored during session, Premedicated before session     Hand Dominance     Extremity/Trunk Assessment Upper Extremity Assessment Upper Extremity Assessment: Generalized weakness   Lower Extremity Assessment Lower Extremity Assessment: LLE deficits/detail LLE Deficits / Details: s/p L hip hemiarthroplasty LLE: Unable to fully assess due to pain LLE Sensation: decreased light touch LLE Coordination: decreased gross motor       Communication Communication Communication: No difficulties   Cognition Arousal/Alertness: Awake/alert Behavior During Therapy: WFL for tasks assessed/performed Overall Cognitive Status: No family/caregiver present to determine baseline cognitive functioning Area of Impairment: Orientation, Attention, Memory, Following commands, Safety/judgement, Awareness, Problem solving                 Orientation Level: Disoriented to, Situation Current Attention Level: Sustained Memory: Decreased short-term memory, Decreased recall of precautions Following Commands: Follows one step commands with increased time Safety/Judgement: Decreased awareness of deficits, Decreased awareness of safety Awareness: Intellectual Problem Solving: Slow processing, Decreased initiation, Difficulty sequencing, Requires verbal cues, Requires tactile cues General Comments: recalled 1/3 precautions after PT had reviewed multiple times in the 30 minutes preceeding this therapist entering room     General Comments       Exercises Other Exercises Other Exercises: edu re: Role of OT, role of rehab, home safety, ADL completion   Shoulder Instructions      Home Living Family/patient expects to be discharged to:: Private residence Living Arrangements: Children Available Help at Discharge: Family;Available PRN/intermittently (HH PT/OT/AID per pt report someone is at the house 5-7 days a  week but ?historian) Type of Home: House Home Access: Stairs to enter [employer] of Steps: 1 partial step Entrance Stairs-Rails: None Home Layout: Able to live on main level with bedroom/bathroom     Bathroom Shower/Tub: Walk-in shower         Home Equipment: Educational psychologist (4 wheels);Wheelchair - manual;Grab bars - toilet;Grab bars - tub/shower;Adaptive equipment;Other (comment) (lift chair) Adaptive Equipment: Sock aid;Reacher;Long-handled shoe horn Additional Comments: pt stated that he has an aide that comes to his home 2x/wk to assist with ADLs. he also reported that he was receiving HHPT and HHOT services, each at 1x/wk; however, pt with cognitive deficits and difficulties with memory (no family present to confirm information provided)      Prior Functioning/Environment Prior Level of Function : Needs assist             Mobility Comments: amb with rollator, fall history ADLs Comments: set up for grooming, feeding; assist required for bathing, dressing, IADLS; pt is able to participate in task but asist required throughout        OT Problem List: Decreased strength;Decreased activity tolerance;Decreased knowledge of use of DME or AE;Decreased safety awareness;Decreased knowledge of precautions;Decreased cognition      OT Treatment/Interventions: Self-care/ADL training;DME and/or AE instruction;Therapeutic activities;Balance training;Therapeutic exercise;Patient/family education    OT Goals(Current goals can be found in the care plan section) Acute Rehab OT Goals Patient Stated Goal: rehab OT Goal Formulation: With patient Time For Goal Achievement: 12/13/22 Potential to Achieve Goals: Good ADL Goals Pt Will Perform Grooming: with min assist;sitting Pt Will Perform Lower Body Dressing: with mod assist Pt Will Transfer to  Toilet: with mod assist;stand pivot transfer Pt Will Perform Toileting - Clothing Manipulation and hygiene: with min assist  OT  Frequency: Min 2X/week    Co-evaluation              AM-PAC OT "6 Clicks" Daily Activity     Outcome Measure Help from another person eating meals?: A Little Help from another person taking care of personal grooming?: A Little Help from another person toileting, which includes using toliet, bedpan, or urinal?: Total Help from another person bathing (including washing, rinsing, drying)?: Total Help from another person to put on and taking off regular upper body clothing?: A Little Help from another person to put on and taking off regular lower body clothing?: A Lot 6 Click Score: 13   End of Session Nurse Communication: Mobility status  Activity Tolerance: Patient tolerated treatment well Patient left: in bed;with call bell/phone within reach;with bed alarm set  OT Visit Diagnosis: Other abnormalities of gait and mobility (R26.89);Muscle weakness (generalized) (M62.81)                Time: 9147-8295 OT Time Calculation (min): 16 min Charges:  OT General Charges $OT Visit: 1 Visit OT Evaluation $OT Eval Moderate Complexity: 1 Mod  Oleta Mouse, OTD OTR/L  11/29/22, 12:19 PM

## 2022-11-29 NOTE — Progress Notes (Signed)
  Subjective: 1 Day Post-Op Procedure(s) (LRB): ARTHROPLASTY BIPOLAR HIP (HEMIARTHROPLASTY) (Left) Patient reports pain as mild.   Patient is well, and has had no acute complaints or problems Plan is to go Rehab after hospital stay. Negative for chest pain and shortness of breath Fever: no Gastrointestinal: Negative for nausea and vomiting  Objective: Vital signs in last 24 hours: Temp:  [97 F (36.1 C)-98.2 F (36.8 C)] 98 F (36.7 C) (06/30 0403) Pulse Rate:  [61-104] 78 (06/30 0403) Resp:  [14-20] 20 (06/30 0403) BP: (93-128)/(60-95) 93/66 (06/30 0403) SpO2:  [93 %-99 %] 94 % (06/30 0403)  Intake/Output from previous day:  Intake/Output Summary (Last 24 hours) at 11/29/2022 0654 Last data filed at 11/29/2022 0616 Gross per 24 hour  Intake 1120 ml  Output 2850 ml  Net -1730 ml    Intake/Output this shift: Total I/O In: 120 [P.O.:120] Out: 1450 [Urine:1450]  Labs: Recent Labs    11/27/22 1304 11/29/22 0441  HGB 10.2* 8.7*   Recent Labs    11/27/22 1304 11/29/22 0441  WBC 6.2 15.2*  RBC 3.81* 3.27*  HCT 32.8* 27.6*  PLT 170 153   Recent Labs    11/27/22 1304 11/29/22 0441  NA 131* 132*  K 4.4 4.4  CL 103 101  CO2 20* 22  BUN 25* 24*  CREATININE 1.07 1.21  GLUCOSE 125* 149*  CALCIUM 8.3* 8.1*   Recent Labs    11/27/22 1632  INR 1.1     EXAM General - Patient is Alert and Confused Extremity - Neurovascular intact Sensation intact distally Dorsiflexion/Plantar flexion intact Compartment soft Dressing/Incision - clean, dry, scant drainage Motor Function - intact, moving foot and toes well on exam.   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HCC)    Hypertension    Neurogenic bladder    Renal cell carcinoma (HCC) 2005   Renal disorder    Stroke (HCC)     01/17/18, 12/21   Some weakness and cognitive decline     Assessment/Plan: 1 Day Post-Op Procedure(s) (LRB): ARTHROPLASTY BIPOLAR HIP (HEMIARTHROPLASTY) (Left) Principal Problem:   Closed left hip fracture (HCC) Active Problems:   Essential hypertension   Renal cell carcinoma s/p nephrectomy, left   Chronic diastolic CHF (congestive heart failure) (HCC)   Dyslipidemia   GERD without esophagitis   PAD (peripheral artery disease) (HCC)   History of stroke   Known medical problems   Overweight (BMI 25.0-29.9)   Hyponatremia   Metabolic acidosis  Estimated body mass index is 29.42 kg/m as calculated from the following:   Height as of this encounter: 6\' 1"  (1.854 m).   Weight as of this encounter: 101.2 kg. Advance diet Up with therapy  DVT Prophylaxis - Foot Pumps, TED hose, and Plavix Weight-Bearing as tolerated to left leg  Dedra Skeens, PA-C Orthopaedic Surgery 11/29/2022, 6:54 AM

## 2022-11-29 NOTE — TOC Initial Note (Signed)
Transition of Care (TOC) - Initial/Assessment Note    Patient Details  Name: Adam Rios. MRN: [id number] Date of Birth: August 25, 1940  Transition of Care [facility]) CM/SW Contact:    Liliana Cline, LCSW Phone Number: 11/29/2022, 2:23 PM  Clinical Narrative:                 Patient recommended for SNF by PT and OT. Per chart review, during recent admission, patient refused SNF and was set up with [facility] and [facility] private pay aide services. Patient is from home with daughter. Has DME at home.  CSW spoke to patient who states he wants to try SNF this time "not going to stay very long." Patient states he has been to many of the local SNFs, he is not sure which would be his preference. He is agreeable to SNF work up and will choose based on bed offers. SNF work up has been started.   Expected Discharge Plan: Skilled Nursing Facility Barriers to Discharge: Continued Medical Work up   Patient Goals and CMS Choice Patient states their goals for this hospitalization and ongoing recovery are:: agrees to SNF CMS Medicare.gov Compare Post Acute Care list provided to:: Patient Choice offered to / list presented to : Patient      Expected Discharge Plan and Services       Living arrangements for the past 2 months: Single Family Home                                      Prior Living Arrangements/Services Living arrangements for the past 2 months: Single Family Home Lives with:: Adult Children Patient language and need for interpreter reviewed:: Yes Do you feel safe going back to the place where you live?: Yes      Need for Family Participation in Patient Care: Yes (Comment) Care giver support system in place?: Yes (comment) Current home services: DME, Homehealth aide, Home OT, Home PT, Home RN Criminal Activity/Legal Involvement Pertinent to Current Situation/Hospitalization: No - Comment as needed  Activities of Daily Living Home Assistive Devices/Equipment:  Other (Comment) (rollator) ADL Screening (condition at time of admission) Patient's cognitive ability adequate to safely complete daily activities?: Yes Is the patient deaf or have difficulty hearing?: No Does the patient have difficulty seeing, even when wearing glasses/contacts?: No Does the patient have difficulty concentrating, remembering, or making decisions?: No Patient able to express need for assistance with ADLs?: Yes Does the patient have difficulty dressing or bathing?: No Independently performs ADLs?: Yes (appropriate for developmental age) Does the patient have difficulty walking or climbing stairs?: Yes Weakness of Legs: Both Weakness of Arms/Hands: None  Permission Sought/Granted Permission sought to share information with : Oceanographer granted to share information with : Yes, Verbal Permission Granted     Permission granted to share info w AGENCY: SNFs        Emotional Assessment       Orientation: : Oriented to Self, Oriented to Place, Oriented to  Time, Oriented to Situation Alcohol / Substance Use: Not Applicable Psych Involvement: No (comment)  Admission diagnosis:  Weakness [R53.1] Closed left hip fracture (HCC) [S72.002A] Closed displaced fracture of left femoral neck (HCC) [S72.002A] Patient Active Problem List   Diagnosis Date Noted   Acute postoperative anemia due to expected blood loss 11/29/2022   Overweight (BMI 25.0-29.9) 11/28/2022   Hyponatremia 11/28/2022  Metabolic acidosis 11/28/2022   Closed left hip fracture (HCC) 11/27/2022   Known medical problems 11/27/2022   Acute metabolic encephalopathy 09/29/2022   Atrial fibrillation with RVR (HCC) 09/28/2022   PAD (peripheral artery disease) (HCC) 09/28/2022   History of stroke 09/28/2022   Sepsis secondary to UTI (HCC) 09/28/2022   Generalized weakness 08/23/2022   Hypokalemia 08/14/2022   AKI (acute kidney injury) (HCC) 08/09/2022   Leukocytosis 08/09/2022    Severe sepsis (HCC) 08/09/2022   Heart failure with preserved ejection fraction (HCC) 08/09/2022   Acute hemorrhagic cystitis 06/11/2022   Iron deficiency anemia due to chronic blood loss 06/11/2022   Dyslipidemia 06/11/2022   GERD without esophagitis 06/11/2022   Lower extremity cellulitis 06/11/2022   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   Second degree heart block 05/02/2022   Acute on chronic congestive heart failure (HCC) 04/30/2022   Chronic diastolic CHF (congestive heart failure) (HCC) 04/30/2022   Obesity (BMI 30-39.9) 04/30/2022   Chronic indwelling Foley catheter 04/30/2022   Septic shock (HCC) 02/11/2022   Pressure injury of skin 02/11/2022   Bacteremia 11/22/2021   Hematuria 11/21/2021   Renal cell carcinoma s/p nephrectomy, left 11/21/2021   History of recurrent UTI and ESBL UTI  11/21/2021   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   UTI (urinary tract infection) 09/09/2021   Neuropathy 09/09/2021   History of CVA (cerebrovascular accident) 09/09/2021   Fall 05/14/2021   Frailty 03/04/2018   Acute encephalopathy 01/30/2018   GERD (gastroesophageal reflux disease) 04/05/2017   Pain 04/05/2017   Weakness 04/05/2017   Recurrent UTI 01/20/2016   Essential hypertension 07/31/2014   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 04/10/2014   Ventral hernia without obstruction or gangrene 04/10/2014   Acute low back pain without sciatica 12/26/2013   Fusion of spine of lumbar region 12/26/2013   Anasarca 09/26/2013   Hemorrhoid 09/26/2013   Neurogenic bladder 09/26/2013   Renal cell cancer (HCC) 09/26/2013   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 [place] [address] Phone: [phone] Fax: [phone]     Social Determinants of Health (SDOH) Social  History: SDOH Screenings   Food Insecurity: No Food Insecurity (11/28/2022)  Housing: Low Risk  (11/28/2022)  Transportation Needs: No Transportation Needs (11/28/2022)  Utilities: Not At Risk (11/28/2022)  Tobacco Use: Medium Risk (11/27/2022)   SDOH Interventions:     Readmission Risk Interventions    08/11/2022    4:53 PM 06/11/2022    2:19 PM 02/12/2022    2:17 PM  Readmission Risk Prevention Plan  Transportation Screening Complete Complete Complete  PCP or Specialist Appt within 3-5 Days  Complete Complete  HRI or Home Care Consult  Complete Complete  Social Work Consult for Recovery Care Planning/Counseling  Complete Complete  Palliative Care Screening  Not Applicable Not Applicable  Medication Review Oceanographer) Complete Complete Referral to Pharmacy  [facility] or Home Care Consult Complete    Skilled Nursing Facility Complete

## 2022-11-29 NOTE — Discharge Instructions (Signed)

## 2022-11-29 NOTE — Evaluation (Signed)
Physical Therapy Evaluation Patient Details Name: Adam Rios. MRN: [id number] DOB: 1941-03-31 Today's Date: 11/29/2022  History of Present Illness  Pt is an 82 y/o male admitted after a mechanical fall at home in which he sustained a L femoral neck fx. Pt s/p L hip hemiarthroplasty with posterior hip precautions, WBAT. PMH including but not limited to HTN, cognitive impairment, CVA, neurogenic bladder.   Clinical Impression  Pt presented supine in bed with HOB elevated, awake and willing to participate in therapy session. He was attempting to eat his breakfast upon PT arrival but was having great difficulty with managing the various cartons, lids, packets, etc. PT provided total A for breakfast set-up. He was then able to demonstrate independence with using a spoon to eat his food (eggs, potatoes, oatmeal) and was able to drink from his coffee cup independently as well. Prior to admission, pt reported that he was able to ambulate with use of a rollator and required assistance from an aide or family for ADLs. Pt lives with his daughter and son-in-law in a house with one step to enter and he is able to stay on the main level. Pt also reported that he has an aide that comes to his home 2x/week, as well as HHPT and HHOT services that he receives 1x/week each. PT reviewed posterior hip precautions with pt multiple times throughout the session, and at the end of the session he was able to recall 1/3 precautions. He also demonstrated difficulty with generalizing those precautions to functional mobility. At the time of evaluation, pt required significant physical assistance with bed mobility (mod-max A) and required min-mod A with use of bilateral UE supports to maintain sitting balance at EOB. Hopeful to progress to standing next session and transferring OOB to recliner chair if able. Pt would continue to benefit from skilled physical therapy services at this time while admitted and after d/c to address the  below listed limitations in order to improve overall safety and independence with functional mobility.      Recommendations for follow up therapy are one component of a multi-disciplinary discharge planning process, led by the attending physician.  Recommendations may be updated based on patient status, additional functional criteria and insurance authorization.  Follow Up Recommendations Can patient physically be transported by private vehicle: No     Assistance Recommended at Discharge Frequent or constant Supervision/Assistance  Patient can return home with the following  Two people to help with walking and/or transfers;A lot of help with bathing/dressing/bathroom;Assistance with cooking/housework;Assistance with feeding;Help with stairs or ramp for entrance;Assist for transportation    Equipment Recommendations Other (comment) (defer to next venue of care)  Recommendations for Other Services       Functional Status Assessment Patient has had a recent decline in their functional status and demonstrates the ability to make significant improvements in function in a reasonable and predictable amount of time.     Precautions / Restrictions Precautions Precautions: Fall;Posterior Hip Precaution Booklet Issued: No Precaution Comments: PT reviewed posterior hip precautions with pt multiple times throughout session; OT present at end of session and pt only able to recall 1/3 precs. Required Braces or Orthoses: Other Brace Other Brace: hip abduction pillow at rest Restrictions Weight Bearing Restrictions: Yes LLE Weight Bearing: Weight bearing as tolerated      Mobility  Bed Mobility Overal bed mobility: Needs Assistance Bed Mobility: Supine to Sit, Sit to Supine     Supine to sit: Mod assist, HOB elevated Sit to supine:  Max assist   General bed mobility comments: increased time and effort needed, physical assistance needed with bilateral LE movement off of and back onto bed,  assistance needed for trunk elevation to achieve a sitting position at EOB; pt able to slightly help with use of bed rails    Transfers                   General transfer comment: pt unable to complete transfer this date secondary to pain, fatigue and weakness; will likely require heavy two person physical assistance to complete    Ambulation/Gait                  Stairs            Wheelchair Mobility    Modified Rankin (Stroke Patients Only)       Balance Overall balance assessment: Needs assistance, History of Falls Sitting-balance support: Feet supported, Single extremity supported, Bilateral upper extremity supported Sitting balance-Leahy Scale: Poor Sitting balance - Comments: pt able to sit EOB with 1-2 UE supports and min-mod A with trunk LOB posteriorly x2 Postural control: Right lateral lean, Posterior lean                                   Pertinent Vitals/Pain Pain Assessment Pain Assessment: 0-10 Pain Score: 7  Pain Location: L hip Pain Descriptors / Indicators: Grimacing, Sore Pain Intervention(s): Monitored during session, Repositioned, Premedicated before session    Home Living Family/patient expects to be discharged to:: Private residence Living Arrangements: Children Available Help at Discharge: Family;Available PRN/intermittently Type of Home: House Home Access: Stairs to enter Entrance Stairs-Rails: None Entrance Stairs-Number of Steps: 1 partial step   Home Layout: Able to live on main level with bedroom/bathroom Home Equipment: Educational psychologist (4 wheels);Wheelchair - manual;Grab bars - toilet;Grab bars - tub/shower;Adaptive equipment;Other (comment) (lift chair) Additional Comments: pt stated that he has an aide that comes to his home 2x/wk to assist with ADLs. he also reported that he was receiving HHPT and HHOT services, each at 1x/wk; however, pt with cognitive deficits and difficulties with memory (no  family present to confirm information provided)    Prior Function Prior Level of Function : Needs assist             Mobility Comments: pt ambulates with use of rollator; has a lift chair to assist with transfers, pt reporting that he sleeps in his lift chair ADLs Comments: pt requires assistance from his aide or family for bathing and dressing     Hand Dominance        Extremity/Trunk Assessment   Upper Extremity Assessment Upper Extremity Assessment: Defer to OT evaluation    Lower Extremity Assessment Lower Extremity Assessment: LLE deficits/detail LLE Deficits / Details: pt with AROM limitations and decreased strength secondary to post-op pain and weakness LLE: Unable to fully assess due to pain LLE Sensation: decreased light touch LLE Coordination: decreased gross motor       Communication   Communication: No difficulties  Cognition Arousal/Alertness: Awake/alert Behavior During Therapy: WFL for tasks assessed/performed Overall Cognitive Status: No family/caregiver present to determine baseline cognitive functioning Area of Impairment: Orientation, Attention, Memory, Following commands, Safety/judgement, Awareness, Problem solving                 Orientation Level: Disoriented to, Situation Current Attention Level: Sustained Memory: Decreased short-term memory, Decreased recall of precautions Following Commands:  Follows one step commands with increased time Safety/Judgement: Decreased awareness of deficits, Decreased awareness of safety Awareness: Intellectual Problem Solving: Slow processing, Decreased initiation, Difficulty sequencing, Requires verbal cues, Requires tactile cues          General Comments      Exercises     Assessment/Plan    PT Assessment Patient needs continued PT services  PT Problem List Decreased strength;Decreased range of motion;Decreased activity tolerance;Decreased balance;Decreased mobility;Decreased  coordination;Decreased cognition;Decreased knowledge of use of DME;Decreased safety awareness;Decreased knowledge of precautions;Pain       PT Treatment Interventions DME instruction;Gait training;Stair training;Functional mobility training;Therapeutic activities;Therapeutic exercise;Balance training;Neuromuscular re-education;Patient/family education;Cognitive remediation    PT Goals (Current goals can be found in the Care Plan section)  Acute Rehab PT Goals Patient Stated Goal: decrease pain PT Goal Formulation: With patient Time For Goal Achievement: 12/13/22 Potential to Achieve Goals: Fair    Frequency 7X/week     Co-evaluation               AM-PAC PT "6 Clicks" Mobility  Outcome Measure Help needed turning from your back to your side while in a flat bed without using bedrails?: A Lot Help needed moving from lying on your back to sitting on the side of a flat bed without using bedrails?: A Lot Help needed moving to and from a bed to a chair (including a wheelchair)?: Total Help needed standing up from a chair using your arms (e.g., wheelchair or bedside chair)?: A Lot Help needed to walk in hospital room?: Total Help needed climbing 3-5 steps with a railing? : Total 6 Click Score: 9    End of Session   Activity Tolerance: Patient limited by pain;Patient limited by fatigue Patient left: in bed;with call bell/phone within reach;with bed alarm set;with SCD's reapplied;Other (comment) (hip abduction pillow in place) Nurse Communication: Mobility status PT Visit Diagnosis: Other abnormalities of gait and mobility (R26.89);Pain Pain - Right/Left: Left Pain - part of body: Hip    Time: 0902-0953 PT Time Calculation (min) (ACUTE ONLY): 51 min   Charges:   PT Evaluation $PT Eval Moderate Complexity: 1 Mod PT Treatments $Therapeutic Activity: 23-37 mins        Arletta Bale, DPT  Acute Rehabilitation Services Office [phone]   Alessandra Bevels Braya Habermehl 11/29/2022,  10:53 AM

## 2022-11-29 NOTE — Progress Notes (Signed)
  Progress Note   Patient: Adam Rios. HYQ:[id number] DOB: [birth year] DOA: 11/27/2022     2 DOS: the patient was seen and examined on 11/29/2022   Brief hospital course: Iker Hyland. is a 82 y.o. male with medical history significant for chronic diastolic CHF, recurrent UTI secondary to indwelling catheter, hypertension, neurogenic bladder, A-fib with RVR, PAD, history of stroke, who presents after a fall with left hip pain.  X-ray showed left hip displaced fracture of the left femur neck. Surgery performed 6/29. Patient states that he had a recent increased weakness, some urinary symptoms.  Urine culture grow gram negative rods, history of ESBL, started meropenem on 6/30.   Principal Problem:   Closed left hip fracture (HCC) Active Problems:   Chronic diastolic CHF (congestive heart failure) (HCC)   UTI (urinary tract infection)   Renal cell carcinoma s/p nephrectomy, left   Essential hypertension   Dyslipidemia   GERD without esophagitis   PAD (peripheral artery disease) (HCC)   History of stroke   Known medical problems   Overweight (BMI 25.0-29.9)   Hyponatremia   Metabolic acidosis   Acute postoperative anemia due to expected blood loss   Assessment and Plan: Closed left femoral neck fracture. Mechanical fall. Post left hip hemiarthroplasty 6/29.   Patient doing well, seen by PT/OT, recommending nursing home placement.  Urinary tract infection. Urine culture growing gram-negative rods, he had a recent weakness and urinary symptoms.  Also has history of ESBL, started on meropenem. UTI could be responsible for patient was increased weakness and fall before hospitalization.   Chronic diastolic congestive heart failure Patient euvolemic, no exacerbation.   Essential hypertension.   Continue some home medicines.   History of stroke. Debility. PT and OT      Subjective:  Patient feel better today, denies any short of breath or cough.  Physical  Exam: Vitals:   11/28/22 2122 11/28/22 2346 11/29/22 0403 11/29/22 0824  BP: 110/68 104/74 93/66 108/69  Pulse: 86 78 78 82  Resp: 20 20 20 16   Temp: 98.2 F (36.8 C) (!) 97.5 F (36.4 C) 98 F (36.7 C) 97.8 F (36.6 C)  TempSrc:      SpO2: 98% 97% 94% 90%  Weight:      Height:       General exam: Appears calm and comfortable  Respiratory system: Clear to auscultation. Respiratory effort normal. Cardiovascular system: S1 & S2 heard, RRR. No JVD, murmurs, rubs, gallops or clicks. No pedal edema. Gastrointestinal system: Abdomen is nondistended, soft and nontender. No organomegaly or masses felt. Normal bowel sounds heard. Central nervous system: Alert and oriented. No focal neurological deficits. Extremities: Symmetric 5 x 5 power. Skin: No rashes, lesions or ulcers Psychiatry: Judgement and insight appear normal. Mood & affect appropriate.    Data Reviewed:  Lab results reviewed.  Family Communication: None  Disposition: Status is: Inpatient Remains inpatient appropriate because: Severity of disease, IV treatment, postop.     Time spent: 35 minutes  Author: Marrion Coy, MD 11/29/2022 12:37 PM  For on call review [email or web address].

## 2022-11-30 ENCOUNTER — Encounter: Payer: Self-pay | Admitting: Orthopedic Surgery

## 2022-11-30 DIAGNOSIS — N390 Urinary tract infection, site not specified: Secondary | ICD-10-CM | POA: Diagnosis not present

## 2022-11-30 DIAGNOSIS — S72002A Fracture of unspecified part of neck of left femur, initial encounter for closed fracture: Secondary | ICD-10-CM | POA: Diagnosis not present

## 2022-11-30 DIAGNOSIS — T83511A Infection and inflammatory reaction due to indwelling urethral catheter, initial encounter: Secondary | ICD-10-CM | POA: Diagnosis not present

## 2022-11-30 DIAGNOSIS — D62 Acute posthemorrhagic anemia: Secondary | ICD-10-CM

## 2022-11-30 LAB — URINE CULTURE
Culture: 10000 — AB
Special Requests: NORMAL

## 2022-11-30 LAB — BASIC METABOLIC PANEL
Anion gap: 11 (ref 5–15)
BUN: 30 mg/dL — ABNORMAL HIGH (ref 8–23)
CO2: 25 mmol/L (ref 22–32)
Calcium: 8.5 mg/dL — ABNORMAL LOW (ref 8.9–10.3)
Chloride: 99 mmol/L (ref 98–111)
Creatinine, Ser: 1.27 mg/dL — ABNORMAL HIGH (ref 0.61–1.24)
GFR, Estimated: 56 mL/min — ABNORMAL LOW (ref >60)
Glucose, Bld: 109 mg/dL — ABNORMAL HIGH (ref 70–99)
Potassium: 3.9 mmol/L (ref 3.5–5.1)
Sodium: 135 mmol/L (ref 135–145)

## 2022-11-30 LAB — CBC
HCT: 27.8 % — ABNORMAL LOW (ref 39.0–52.0)
Hemoglobin: 8.8 g/dL — ABNORMAL LOW (ref 13.0–17.0)
MCH: 26.4 pg (ref 26.0–34.0)
MCHC: 31.7 g/dL (ref 30.0–36.0)
MCV: 83.5 fL (ref 80.0–100.0)
Platelets: 157 10*3/uL (ref 150–400)
RBC: 3.33 MIL/uL — ABNORMAL LOW (ref 4.22–5.81)
RDW: 15.6 % — ABNORMAL HIGH (ref 11.5–15.5)
WBC: 12.6 10*3/uL — ABNORMAL HIGH (ref 4.0–10.5)
nRBC: 0 % (ref 0.0–0.2)

## 2022-11-30 LAB — GLUCOSE, CAPILLARY: Glucose-Capillary: 117 mg/dL — ABNORMAL HIGH (ref 70–99)

## 2022-11-30 MED ORDER — SODIUM CHLORIDE 0.9 % IV SOLN
1.0000 g | INTRAVENOUS | Status: DC
  Administered 2022-11-30: 1 g via INTRAVENOUS
  Filled 2022-11-30 (×2): qty 10

## 2022-11-30 NOTE — Progress Notes (Signed)
  Subjective: 2 Days Post-Op Procedure(s) (LRB): ARTHROPLASTY BIPOLAR HIP (HEMIARTHROPLASTY) (Left) Patient reports pain as mild.   Patient is well, and has had no acute complaints or problems Plan is to go Rehab after hospital stay. Negative for chest pain and shortness of breath Fever: no Gastrointestinal: Negative for nausea and vomiting  Objective: Vital signs in last 24 hours: Temp:  [97.8 F (36.6 C)-98.6 F (37 C)] 98.4 F (36.9 C) (07/01 0023) Pulse Rate:  [82-102] 102 (07/01 0023) Resp:  [16-18] 18 (07/01 0023) BP: (101-108)/(58-69) 105/58 (07/01 0023) SpO2:  [90 %-91 %] 91 % (07/01 0023)  Intake/Output from previous day:  Intake/Output Summary (Last 24 hours) at 11/30/2022 0700 Last data filed at 11/30/2022 0300 Gross per 24 hour  Intake --  Output 2425 ml  Net -2425 ml    Intake/Output this shift: Total I/O In: -  Out: 1700 [Urine:1700]  Labs: Recent Labs    11/27/22 1304 11/29/22 0441 11/30/22 0335  HGB 10.2* 8.7* 8.8*   Recent Labs    11/29/22 0441 11/30/22 0335  WBC 15.2* 12.6*  RBC 3.27* 3.33*  HCT 27.6* 27.8*  PLT 153 157   Recent Labs    11/29/22 0441 11/30/22 0335  NA 132* 135  K 4.4 3.9  CL 101 99  CO2 22 25  BUN 24* 30*  CREATININE 1.21 1.27*  GLUCOSE 149* 109*  CALCIUM 8.1* 8.5*   Recent Labs    11/27/22 1632  INR 1.1     EXAM General - Patient is Alert and Confused Extremity - Neurovascular intact Sensation intact distally Dorsiflexion/Plantar flexion intact Compartment soft Dressing/Incision - clean, dry, scant drainage Motor Function - intact, moving foot and toes well on exam.   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HCC)    Hypertension    Neurogenic bladder    Renal cell carcinoma (HCC) 2005   Renal disorder    Stroke (HCC)     01/17/18, 12/21   Some weakness and cognitive decline     Assessment/Plan: 2 Days Post-Op Procedure(s) (LRB): ARTHROPLASTY BIPOLAR HIP (HEMIARTHROPLASTY) (Left) Principal Problem:   Closed left hip fracture (HCC) Active Problems:   Essential hypertension   UTI (urinary tract infection)   Renal cell carcinoma s/p nephrectomy, left   Chronic diastolic CHF (congestive heart failure) (HCC)   Dyslipidemia   GERD without esophagitis   PAD (peripheral artery disease) (HCC)   History of stroke   Known medical problems   Overweight (BMI 25.0-29.9)   Hyponatremia   Metabolic acidosis   Acute postoperative anemia due to expected blood loss  Estimated body mass index is 29.42 kg/m as calculated from the following:   Height as of this encounter: 6\' 1"  (1.854 m).   Weight as of this encounter: 101.2 kg. Advance diet Up with therapy  Discharge planning: Plan to discharge to rehab with physical therapy and Occupational Therapy.  Follow-up with [facility] clinic orthopedics in 2 weeks for staple removal and x-rays of the left hip  DVT Prophylaxis - Foot Pumps, TED hose, and Plavix Weight-Bearing as tolerated to left leg  Dedra Skeens, PA-C Orthopaedic Surgery 11/30/2022, 7:00 AM

## 2022-11-30 NOTE — TOC Progression Note (Signed)
Transition of Care (TOC) - Progression Note    Patient Details  Name: Adam Rios. MRN: [id number] Date of Birth: Sep 25, 1940  Transition of Care [facility]) CM/SW Contact  Marlowe Sax, RN Phone Number: 11/30/2022, 2:24 PM  Clinical Narrative:    Met with the patient he provided permission To call daughter Lennox Laity Daughter [name] and reviewed bed offers They chose [facility] I notified Tiffany at [facility], Ins pending      Expected Discharge Plan: Skilled Nursing Facility Barriers to Discharge: Continued Medical Work up  Expected Discharge Plan and Services       Living arrangements for the past 2 months: Single Family Home                                       Social Determinants of Health (SDOH) Interventions SDOH Screenings   Food Insecurity: No Food Insecurity (11/28/2022)  Housing: Low Risk  (11/28/2022)  Transportation Needs: No Transportation Needs (11/28/2022)  Utilities: Not At Risk (11/28/2022)  Tobacco Use: Medium Risk (11/30/2022)    Readmission Risk Interventions    08/11/2022    4:53 PM 06/11/2022    2:19 PM 02/12/2022    2:17 PM  Readmission Risk Prevention Plan  Transportation Screening Complete Complete Complete  PCP or Specialist Appt within 3-5 Days  Complete Complete  HRI or Home Care Consult  Complete Complete  Social Work Consult for Recovery Care Planning/Counseling  Complete Complete  Palliative Care Screening  Not Applicable Not Applicable  Medication Review Oceanographer) Complete Complete Referral to Pharmacy  [facility] or Home Care Consult Complete    Skilled Nursing Facility Complete

## 2022-11-30 NOTE — Care Management Important Message (Signed)
Important Message  Patient Details  Name: Adam Rios. MRN: [id number] Date of Birth: Nov 10, 1940   Medicare Important Message Given:  N/A - LOS <3 / Initial given by admissions     Terrian Ridlon A Shian Goodnow 11/30/2022, 10:04 AM

## 2022-11-30 NOTE — Progress Notes (Signed)
Physical Therapy Treatment Patient Details Name: Adam Rios. MRN: [id number] DOB: 01/04/1941 Today's Date: 11/30/2022   History of Present Illness Pt is an 82 year old male s/p  left hip hemiarthroplasty 6/29 after mechanical fall; Pmh significant for chronic diastolic CHF, recurrent UTI secondary to indwelling catheter, hypertension, neurogenic bladder, A-fib with RVR, PAD, history of stroke    PT Comments  Pt seen for PT tx with pt received in bed, reporting 6/10 pain. Pt not aware of posterior hip precautions at beginning or end of session after PT educates him on them. PT encouraged pt to perform/participate in LLE strengthening/ROM exercises but pt with c/o pain & falling asleep. Notified nurse of pt's lethargy during session. Pt will require +2 assist to safely attempt mobilization.     Assistance Recommended at Discharge Frequent or constant Supervision/Assistance  If plan is discharge home, recommend the following:  Can travel by private vehicle    Two people to help with walking and/or transfers;Assistance with cooking/housework;Assistance with feeding;Help with stairs or ramp for entrance;Assist for transportation;Two people to help with bathing/dressing/bathroom   No  Equipment Recommendations  None recommended by PT (TBD in next venue)    Recommendations for Other Services       Precautions / Restrictions Precautions Precautions: Fall;Posterior Hip Restrictions Weight Bearing Restrictions: Yes LLE Weight Bearing: Weight bearing as tolerated     Mobility  Bed Mobility                    Transfers                        Ambulation/Gait                   Stairs             Wheelchair Mobility     Tilt Bed    Modified Rankin (Stroke Patients Only)       Balance                                            Cognition Arousal/Alertness: Lethargic Behavior During Therapy: WFL for tasks  assessed/performed Overall Cognitive Status: No family/caregiver present to determine baseline cognitive functioning Area of Impairment: Orientation, Attention, Memory, Following commands, Safety/judgement, Awareness, Problem solving                 Orientation Level: Disoriented to, Situation, Time (reports it's "04" then "84") Current Attention Level: Sustained Memory: Decreased short-term memory, Decreased recall of precautions Following Commands: Follows one step commands with increased time, Follows one step commands inconsistently Safety/Judgement: Decreased awareness of deficits, Decreased awareness of safety Awareness: Intellectual Problem Solving: Slow processing, Decreased initiation, Difficulty sequencing, Requires verbal cues, Requires tactile cues General Comments: Pt unable to recall any posterior hip precautions, not aware he has any, despite PT educating him during session. Poor alertness, falling asleep throughout session, not following simple commands to participate in LLE exercises.        Exercises General Exercises - Lower Extremity Ankle Circles/Pumps: Left, 10 reps, PROM Heel Slides: PROM, Supine, Left, 5 reps Hip ABduction/ADduction: PROM, Supine, Left, 5 reps    General Comments        Pertinent Vitals/Pain Pain Assessment Pain Assessment: 0-10 Pain Score: 6  Pain Location: L hip Pain Descriptors / Indicators: Grimacing, Sore, Guarding Pain Intervention(s):  Monitored during session, Patient requesting pain meds-RN notified, Limited activity within patient's tolerance    Home Living                          Prior Function            PT Goals (current goals can now be found in the care plan section) Acute Rehab PT Goals Patient Stated Goal: decrease pain PT Goal Formulation: With patient Time For Goal Achievement: 12/13/22 Potential to Achieve Goals: Fair Progress towards PT goals: PT to reassess next treatment     Frequency    7X/week      PT Plan Current plan remains appropriate    Co-evaluation              AM-PAC PT "6 Clicks" Mobility   Outcome Measure  Help needed turning from your back to your side while in a flat bed without using bedrails?: Total Help needed moving from lying on your back to sitting on the side of a flat bed without using bedrails?: Total Help needed moving to and from a bed to a chair (including a wheelchair)?: Total Help needed standing up from a chair using your arms (e.g., wheelchair or bedside chair)?: Total Help needed to walk in hospital room?: Total Help needed climbing 3-5 steps with a railing? : Total 6 Click Score: 6    End of Session   Activity Tolerance: Patient limited by lethargy Patient left: in bed;with bed alarm set;with call bell/phone within reach Nurse Communication: Mobility status (pt's request for pain meds but lethargy during session) PT Visit Diagnosis: Other abnormalities of gait and mobility (R26.89);Pain;Muscle weakness (generalized) (M62.81);Difficulty in walking, not elsewhere classified (R26.2) Pain - Right/Left: Left Pain - part of body: Hip     Time: 8295-6213 PT Time Calculation (min) (ACUTE ONLY): 10 min  Charges:    $Therapeutic Activity: 8-22 mins PT General Charges $$ ACUTE PT VISIT: 1 Visit                     Aleda Grana, PT, DPT 11/30/22, 2:20 PM   Sandi Mariscal 11/30/2022, 2:19 PM

## 2022-11-30 NOTE — Progress Notes (Addendum)
  Progress Note   Patient: Adam Rios. WUJ:[id number] DOB: 11/01/1940 DOA: 11/27/2022     3 DOS: the patient was seen and examined on 11/30/2022   Brief hospital course: Adam Rios. is a 82 y.o. male with medical history significant for chronic diastolic CHF, recurrent UTI secondary to indwelling catheter, hypertension, neurogenic bladder, A-fib with RVR, PAD, history of stroke, who presents after a fall with left hip pain.  X-ray showed left hip displaced fracture of the left femur neck. Surgery performed 6/29. Patient states that he had a recent increased weakness, some urinary symptoms.  Urine culture grow gram negative rods, history of ESBL, started meropenem on 6/30.  Changed back to Rocephin 7/1 x 3 days.   Principal Problem:   Closed left hip fracture (HCC) Active Problems:   Chronic diastolic CHF (congestive heart failure) (HCC)   UTI (urinary tract infection)   Renal cell carcinoma s/p nephrectomy, left   Essential hypertension   Dyslipidemia   GERD without esophagitis   PAD (peripheral artery disease) (HCC)   History of stroke   Known medical problems   Overweight (BMI 25.0-29.9)   Hyponatremia   Metabolic acidosis   Acute postoperative anemia due to expected blood loss   Assessment and Plan: Closed left femoral neck fracture. Mechanical fall. Acute blood loss anemia. Post left hip hemiarthroplasty 6/29.   Patient doing well, seen by PT/OT, recommending nursing home placement. Continue as needed pain medicine. Repeat hemoglobin tomorrow.  Urinary tract infection due to indwelling Foley catheter secondary to E. coli. Urine culture growing gram-negative rods, he had a recent weakness and urinary symptoms.  Urine culture finalized with E. coli, susceptible to Rocephin, continue for 3 days.  Chronic diastolic congestive heart failure Patient euvolemic, no exacerbation.   Essential hypertension.   Continue some home medicines.   History of  stroke. Debility. PT and OT      Subjective:  Patient still complaining of hip pain, refused taking oxycodone due to side effects.  Physical Exam: Vitals:   11/29/22 0824 11/29/22 1520 11/30/22 0023 11/30/22 0912  BP: 108/69 101/65 (!) 105/58 110/75  Pulse: 82 92 (!) 102 84  Resp: 16 16 18 17   Temp: 97.8 F (36.6 C) 98.6 F (37 C) 98.4 F (36.9 C) 98.7 F (37.1 C)  TempSrc:      SpO2: 90% 90% 91% 92%  Weight:      Height:       General exam: Appears calm and comfortable  Respiratory system: Clear to auscultation. Respiratory effort normal. Cardiovascular system: S1 & S2 heard, RRR. No JVD, murmurs, rubs, gallops or clicks. No pedal edema. Gastrointestinal system: Abdomen is nondistended, soft and nontender. No organomegaly or masses felt. Normal bowel sounds heard. Central nervous system: Alert and oriented. No focal neurological deficits. Extremities: Symmetric 5 x 5 power. Skin: No rashes, lesions or ulcers Psychiatry: Judgement and insight appear normal. Mood & affect appropriate.    Data Reviewed:  Lab results reviewed.  Family Communication: None  Disposition: Status is: Inpatient Remains inpatient appropriate because: Severity of disease, IV treatment.     Time spent: 35 minutes  Author: Marrion Coy, MD 11/30/2022 12:30 PM  For on call review [email or web address].

## 2022-12-01 LAB — CBC
HCT: 28.1 % — ABNORMAL LOW (ref 39.0–52.0)
Hemoglobin: 9 g/dL — ABNORMAL LOW (ref 13.0–17.0)
MCH: 26.3 pg (ref 26.0–34.0)
MCHC: 32 g/dL (ref 30.0–36.0)
MCV: 82.2 fL (ref 80.0–100.0)
Platelets: 171 10*3/uL (ref 150–400)
RBC: 3.42 MIL/uL — ABNORMAL LOW (ref 4.22–5.81)
RDW: 15.7 % — ABNORMAL HIGH (ref 11.5–15.5)
WBC: 9.4 10*3/uL (ref 4.0–10.5)
nRBC: 0 % (ref 0.0–0.2)

## 2022-12-01 LAB — MAGNESIUM: Magnesium: 2 mg/dL (ref 1.7–2.4)

## 2022-12-01 LAB — BASIC METABOLIC PANEL
Anion gap: 9 (ref 5–15)
BUN: 29 mg/dL — ABNORMAL HIGH (ref 8–23)
CO2: 26 mmol/L (ref 22–32)
Calcium: 8.1 mg/dL — ABNORMAL LOW (ref 8.9–10.3)
Chloride: 99 mmol/L (ref 98–111)
Creatinine, Ser: 0.95 mg/dL (ref 0.61–1.24)
GFR, Estimated: >60 mL/min (ref >60)
Glucose, Bld: 96 mg/dL (ref 70–99)
Potassium: 3.7 mmol/L (ref 3.5–5.1)
Sodium: 134 mmol/L — ABNORMAL LOW (ref 135–145)

## 2022-12-01 MED ORDER — SODIUM CHLORIDE 0.9 % IV SOLN
1.0000 g | INTRAVENOUS | Status: AC
  Administered 2022-12-01: 1 g via INTRAVENOUS
  Filled 2022-12-01: qty 10

## 2022-12-01 NOTE — Progress Notes (Signed)
Per MD hold metoprolol. Pt BP is 103/74 and HR is 81.

## 2022-12-01 NOTE — Progress Notes (Signed)
Physical Therapy Treatment Patient Details Name: Adam Rios. MRN: [id number] DOB: 16-Aug-1940 Today's Date: 12/01/2022   History of Present Illness Pt is an 82 year old male s/p  left hip hemiarthroplasty 6/29 after mechanical fall; Pmh significant for chronic diastolic CHF, recurrent UTI secondary to indwelling catheter, hypertension, neurogenic bladder, A-fib with RVR, PAD, history of stroke    PT Comments  Pt seen for PT tx with co-tx with OT student for pt & therapists' safety. Pt continues to present with decreased cognition & overall awareness. Pt requires max assist +2 for supine<>sit, but does progress to standing attempts from significantly elevated EOB with RW. Pt is making slow, steady progress with max encouragement throughout session. Pt would benefit from +2 assist to progress standing attempts & transferring to recliner.     Assistance Recommended at Discharge Frequent or constant Supervision/Assistance  If plan is discharge home, recommend the following:  Can travel by private vehicle    Two people to help with walking and/or transfers;Assistance with cooking/housework;Assistance with feeding;Help with stairs or ramp for entrance;Assist for transportation;Two people to help with bathing/dressing/bathroom   No  Equipment Recommendations  None recommended by PT (TBD in next venue)    Recommendations for Other Services       Precautions / Restrictions Precautions Precautions: Fall;Posterior Hip Precaution Booklet Issued: No Precaution Comments: Pt unable to recall posterior hip precautions. Reported x2 his surgery was located at his spine. Required Braces or Orthoses: Other Brace Other Brace: hip abduction pillow at rest Restrictions Weight Bearing Restrictions: Yes LLE Weight Bearing: Weight bearing as tolerated     Mobility  Bed Mobility Overal bed mobility: Needs Assistance Bed Mobility: Rolling Rolling: Max assist (positioned pillow between pt's BLE to  maintain posterior hip precautions with rolling)   Supine to sit: Max assist, +2 for physical assistance, +2 for safety/equipment Sit to supine: Max assist, +2 for physical assistance   General bed mobility comments: cuing for initiation & sequencing movement.    Transfers Overall transfer level: Needs assistance Equipment used: Rolling walker (2 wheels) Transfers: Sit to/from Stand Sit to Stand: Max assist, +2 physical assistance           General transfer comment: STS from elevated EOB. Pt attempted STS with 1UE on RW, 1 UE pushing on bed but pt unable to transition UE from bed to RW so pt attempted STS again with BUE on RW. Pt requires cuing for upright posture, forward vs downward gaze, and anterior pelvic shift but pt unable to perform.    Ambulation/Gait                   Stairs             Wheelchair Mobility     Tilt Bed    Modified Rankin (Stroke Patients Only)       Balance Overall balance assessment: Needs assistance, History of Falls Sitting-balance support: Feet supported, Bilateral upper extremity supported Sitting balance-Leahy Scale: Fair Sitting balance - Comments: supervision static sitting with BUE support.   Standing balance support: Bilateral upper extremity supported, Reliant on assistive device for balance Standing balance-Leahy Scale: Poor                              Cognition Arousal/Alertness: Awake/alert Behavior During Therapy: WFL for tasks assessed/performed Overall Cognitive Status: No family/caregiver present to determine baseline cognitive functioning Area of Impairment: Orientation, Attention, Memory, Following commands, Safety/judgement,  Awareness, Problem solving                 Orientation Level: Disoriented to, Situation (pt unaware of reason for admission, reports his sx was in his spine)   Memory: Decreased short-term memory, Decreased recall of precautions Following Commands: Follows one  step commands with increased time, Follows one step commands inconsistently Safety/Judgement: Decreased awareness of deficits, Decreased awareness of safety Awareness: Intellectual Problem Solving: Slow processing, Decreased initiation, Difficulty sequencing, Requires verbal cues, Requires tactile cues General Comments: Pt unaware of incontinent BM.        Exercises General Exercises - Lower Extremity Long Arc Quad: AROM, Seated, Strengthening, Both, 5 reps Other Exercises Other Exercises: Pt participated in scooting to R along EOB with max assist.    General Comments General comments (skin integrity, edema, etc.): Pt requires dependent assist for peri hygiene.      Pertinent Vitals/Pain Pain Assessment Pain Assessment: Faces Faces Pain Scale: Hurts whole lot Pain Location: L hip Pain Descriptors / Indicators: Grimacing, Sore, Guarding Pain Intervention(s): Monitored during session, Limited activity within patient's tolerance, Repositioned    Home Living                          Prior Function            PT Goals (current goals can now be found in the care plan section) Acute Rehab PT Goals Patient Stated Goal: decrease pain PT Goal Formulation: With patient Time For Goal Achievement: 12/13/22 Potential to Achieve Goals: Fair Progress towards PT goals: Progressing toward goals    Frequency    7X/week      PT Plan Current plan remains appropriate    Co-evaluation PT/OT/SLP Co-Evaluation/Treatment: Yes Reason for Co-Treatment: For patient/therapist safety;To address functional/ADL transfers PT goals addressed during session: Mobility/safety with mobility;Balance;Strengthening/ROM        AM-PAC PT "6 Clicks" Mobility   Outcome Measure  Help needed turning from your back to your side while in a flat bed without using bedrails?: Total Help needed moving from lying on your back to sitting on the side of a flat bed without using bedrails?: Total Help  needed moving to and from a bed to a chair (including a wheelchair)?: Total Help needed standing up from a chair using your arms (e.g., wheelchair or bedside chair)?: Total Help needed to walk in hospital room?: Total Help needed climbing 3-5 steps with a railing? : Total 6 Click Score: 6    End of Session Equipment Utilized During Treatment: Gait belt Activity Tolerance: Patient limited by fatigue Patient left: in bed;with call bell/phone within reach;with bed alarm set Nurse Communication: Mobility status PT Visit Diagnosis: Other abnormalities of gait and mobility (R26.89);Pain;Muscle weakness (generalized) (M62.81);Difficulty in walking, not elsewhere classified (R26.2) Pain - Right/Left: Left Pain - part of body: Hip     Time: 6295-2841 PT Time Calculation (min) (ACUTE ONLY): 32 min  Charges:    $Therapeutic Activity: 8-22 mins PT General Charges $$ ACUTE PT VISIT: 1 Visit                     Aleda Grana, PT, DPT 12/01/22, 12:46 PM   Sandi Mariscal 12/01/2022, 12:44 PM

## 2022-12-01 NOTE — Progress Notes (Signed)
  Subjective: 3 Days Post-Op Procedure(s) (LRB): ARTHROPLASTY BIPOLAR HIP (HEMIARTHROPLASTY) (Left) Patient reports pain as moderate.   Patient is well, and has had no acute complaints or problems Plan is to go Rehab after hospital stay. Negative for chest pain and shortness of breath Fever: no Gastrointestinal: Negative for nausea and vomiting  Objective: Vital signs in last 24 hours: Temp:  [97.7 F (36.5 C)-98.4 F (36.9 C)] 97.7 F (36.5 C) (07/02 0731) Pulse Rate:  [81-83] 81 (07/02 0731) Resp:  [15-18] 17 (07/02 0731) BP: (93-109)/(64-75) 103/74 (07/02 0731) SpO2:  [87 %-97 %] 93 % (07/02 0731)  Intake/Output from previous day:  Intake/Output Summary (Last 24 hours) at 12/01/2022 1128 Last data filed at 12/01/2022 1055 Gross per 24 hour  Intake 720 ml  Output 1000 ml  Net -280 ml    Intake/Output this shift: Total I/O In: 240 [P.O.:240] Out: -   Labs: Recent Labs    11/29/22 0441 11/30/22 0335 12/01/22 0535  HGB 8.7* 8.8* 9.0*   Recent Labs    11/30/22 0335 12/01/22 0535  WBC 12.6* 9.4  RBC 3.33* 3.42*  HCT 27.8* 28.1*  PLT 157 171   Recent Labs    11/30/22 0335 12/01/22 0535  NA 135 134*  K 3.9 3.7  CL 99 99  CO2 25 26  BUN 30* 29*  CREATININE 1.27* 0.95  GLUCOSE 109* 96  CALCIUM 8.5* 8.1*   No results for input(s): "LABPT", "INR" in the last 72 hours.    EXAM General - Patient is Alert and Confused Extremity - Neurovascular intact Sensation intact distally Dorsiflexion/Plantar flexion intact Compartment soft Dressing/Incision - clean, dry, scant drainage Motor Function - intact, moving foot and toes well on exam.  Limited sitting ability.  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HCC)    Hypertension    Neurogenic bladder    Renal cell carcinoma (HCC) 2005   Renal disorder    Stroke (HCC)     01/17/18,  12/21   Some weakness and cognitive decline    Assessment/Plan: 3 Days Post-Op Procedure(s) (LRB): ARTHROPLASTY BIPOLAR HIP (HEMIARTHROPLASTY) (Left) Principal Problem:   Closed left hip fracture (HCC) Active Problems:   Essential hypertension   UTI (urinary tract infection)   Renal cell carcinoma s/p nephrectomy, left   Chronic diastolic CHF (congestive heart failure) (HCC)   Dyslipidemia   GERD without esophagitis   PAD (peripheral artery disease) (HCC)   History of stroke   Known medical problems   Overweight (BMI 25.0-29.9)   Hyponatremia   Metabolic acidosis   Acute postoperative anemia due to expected blood loss  Estimated body mass index is 29.42 kg/m as calculated from the following:   Height as of this encounter: 6\' 1"  (1.854 m).   Weight as of this encounter: 101.2 kg. Advance diet Up with therapy  Discharge planning: Plan to discharge to rehab with physical therapy and Occupational Therapy.  Follow-up with [facility] in 2 weeks for staple removal and x-rays of the left hip  DVT Prophylaxis - Foot Pumps, TED hose, and Plavix Weight-Bearing as tolerated to left leg  Dedra Skeens, PA-C Orthopaedic Surgery 12/01/2022, 11:28 AM

## 2022-12-01 NOTE — Progress Notes (Signed)
  Progress Note   Patient: Adam Rios. ZOX:[id number] DOB: 1940/09/01 DOA: 11/27/2022     4 DOS: the patient was seen and examined on 12/01/2022   Brief hospital course: Manus Bergthold. is a 82 y.o. male with medical history significant for chronic diastolic CHF, recurrent UTI secondary to indwelling catheter, hypertension, neurogenic bladder, A-fib with RVR, PAD, history of stroke, who presents after a fall with left hip pain.  X-ray showed left hip displaced fracture of the left femur neck. Surgery performed 6/29. Patient states that he had a recent increased weakness, some urinary symptoms.  Urine culture grow gram negative rods, history of ESBL, started meropenem on 6/30.  Changed back to Rocephin 7/1 x 3 days.  7/2: Metoprolol held as blood pressure was soft.  Waiting for SNF  Principal Problem:   Closed left hip fracture (HCC) Active Problems:   Chronic diastolic CHF (congestive heart failure) (HCC)   UTI (urinary tract infection)   Renal cell carcinoma s/p nephrectomy, left   Essential hypertension   Dyslipidemia   GERD without esophagitis   PAD (peripheral artery disease) (HCC)   History of stroke   Known medical problems   Overweight (BMI 25.0-29.9)   Hyponatremia   Metabolic acidosis   Acute postoperative anemia due to expected blood loss   Assessment and Plan: Closed left femoral neck fracture. Mechanical fall. Acute blood loss anemia. Post left hip hemiarthroplasty 6/29.   Patient doing well, seen by PT/OT, recommending SNF.  TOC working on placement.  Likely discharge on 7/5 Continue as needed pain medicine.  Urinary tract infection due to indwelling Foley catheter secondary to E. coli. Urine culture growing 10,000 colonies of E. coli, he had a recent weakness and urinary symptoms.  Completed antibiotics today.  Chronic diastolic congestive heart failure Patient euvolemic, no exacerbation.   Essential hypertension.   Continue some home medicines.    History of stroke. Debility. PT and OT working with him while here      Subjective:  No new issues.  Reports spilling water in his bed while trying to drink  Physical Exam: Vitals:   11/30/22 1729 11/30/22 1800 12/01/22 0001 12/01/22 0731  BP: 93/64  109/75 103/74  Pulse: 81  83 81  Resp: 15  18 17   Temp: 98.4 F (36.9 C)  98.4 F (36.9 C) 97.7 F (36.5 C)  TempSrc:      SpO2: (!) 87% 97% 93% 93%  Weight:      Height:       General exam: Appears calm and comfortable  Respiratory system: Clear to auscultation. Respiratory effort normal. Cardiovascular system: S1 & S2 heard, RRR. No JVD, murmurs, rubs, gallops or clicks. No pedal edema. Gastrointestinal system: Abdomen is nondistended, soft and nontender. No organomegaly or masses felt. Normal bowel sounds heard. Central nervous system: Alert and oriented. No focal neurological deficits. Extremities: Symmetric 5 x 5 power. Skin: No rashes, lesions or ulcers Psychiatry: Judgement and insight appear normal. Mood & affect appropriate.    Data Reviewed:  Lab results reviewed.  Family Communication: None  Disposition: Status is: Inpatient Remains inpatient appropriate because: Severity of disease, IV treatment.    DVT prophylaxis-SCDs Time spent: 35 minutes  Author: Delfino Lovett, MD 12/01/2022 3:28 PM  For on call review [email or web address].

## 2022-12-01 NOTE — Plan of Care (Signed)

## 2022-12-01 NOTE — Progress Notes (Signed)
Occupational Therapy Treatment Patient Details Name: Adam Rios. MRN: [id number] DOB: Aug 10, 1940 Today's Date: 12/01/2022   History of present illness Pt is an 82 year old male s/p  left hip hemiarthroplasty 6/29 after mechanical fall; Pmh significant for chronic diastolic CHF, recurrent UTI secondary to indwelling catheter, hypertension, neurogenic bladder, A-fib with RVR, PAD, history of stroke   OT comments  Pt seen for OT treatment on this date. Upon arrival to room pt resting in bed, agreeable to tx. Pt reported pain of 8/10 prior to session. Pt was agreeable to movement. Pt requires MAX A for bed mobility to facilitate trunk and LLE. CGA while sitting EOB for 10 minutes. Pt stood for pericare with MAX A +2. Set up for oral hygiene and washing face. Pt making good progress toward goals, will continue to follow POC. Discharge recommendation remains appropriate.     Recommendations for follow up therapy are one component of a multi-disciplinary discharge planning process, led by the attending physician.  Recommendations may be updated based on patient status, additional functional criteria and insurance authorization.    Assistance Recommended at Discharge Frequent or constant Supervision/Assistance  Patient can return home with the following  Two people to help with bathing/dressing/bathroom;Two people to help with walking and/or transfers   Equipment Recommendations  Other (comment) (refer to next venue of care)    Recommendations for Other Services      Precautions / Restrictions Precautions Precautions: Fall;Posterior Hip Precaution Booklet Issued: No Precaution Comments: Pt unable to recall posterior hip precautions. Reported x2 his surgery was located at his spine. Required Braces or Orthoses: Other Brace Other Brace: hip abduction pillow at rest Restrictions Weight Bearing Restrictions: Yes LLE Weight Bearing: Weight bearing as tolerated       Mobility Bed  Mobility Overal bed mobility: Needs Assistance Bed Mobility: Supine to Sit, Sit to Supine     Supine to sit: Max assist Sit to supine: Max assist, +2 for physical assistance   General bed mobility comments: Trunk and LE management provided during bed mobility activities.    Transfers Overall transfer level: Needs assistance Equipment used: Rolling walker (2 wheels) Transfers: Sit to/from Stand Sit to Stand: Max assist, +2 physical assistance           General transfer comment: Pt stood EOB for pericare with RW and MAX A +2     Balance Overall balance assessment: Needs assistance, History of Falls Sitting-balance support: Feet supported, Bilateral upper extremity supported Sitting balance-Leahy Scale: Poor Sitting balance - Comments: Pt was able to perform short bouts of unsupported seated during ADL tasks, but resumed bilateral supported sitting to off shift weight. Postural control: Right lateral lean, Posterior lean Standing balance support: Bilateral upper extremity supported, Reliant on assistive device for balance Standing balance-Leahy Scale: Zero                             ADL either performed or assessed with clinical judgement   ADL Overall ADL's : Needs assistance/impaired     Grooming: Wash/dry face;Oral care;Set up                       Toileting- Clothing Manipulation and Hygiene: Sit to/from stand;Maximal assistance         General ADL Comments: Able to performed seated tasks at EOB with set up, including eating, oral hygiene, and face washing.    Extremity/Trunk Assessment  Vision       Perception     Praxis      Cognition Arousal/Alertness: Lethargic Behavior During Therapy: WFL for tasks assessed/performed Overall Cognitive Status: No family/caregiver present to determine baseline cognitive functioning Area of Impairment: Orientation, Attention, Memory, Following commands, Safety/judgement,  Awareness, Problem solving                 Orientation Level: Disoriented to, Situation (Pt able to state the year and location accurately. Did not remember correct reason for admission, reporting he had spinal surgery.) Current Attention Level: Sustained     Safety/Judgement: Decreased awareness of deficits, Decreased awareness of safety              Exercises      Shoulder Instructions       General Comments Pt requires dependent assist for peri hygiene.    Pertinent Vitals/ Pain       Pain Assessment Pain Assessment: 0-10 Pain Score: 8  Pain Location: L hip Pain Descriptors / Indicators: Grimacing, Sore, Guarding Pain Intervention(s): Monitored during session, Repositioned, Relaxation, Other (comment) (Pain decreased with rest.)  Home Living                                          Prior Functioning/Environment              Frequency  Min 2X/week        Progress Toward Goals  OT Goals(current goals can now be found in the care plan section)  Progress towards OT goals: Progressing toward goals  Acute Rehab OT Goals Patient Stated Goal: To feel better OT Goal Formulation: With patient Time For Goal Achievement: 12/13/22 Potential to Achieve Goals: Good ADL Goals Pt Will Perform Grooming: with min assist;sitting Pt Will Perform Lower Body Dressing: with mod assist Pt Will Transfer to Toilet: with mod assist;stand pivot transfer Pt Will Perform Toileting - Clothing Manipulation and hygiene: with min assist  Plan Discharge plan remains appropriate    Co-evaluation    PT/OT/SLP Co-Evaluation/Treatment: Yes Reason for Co-Treatment: For patient/therapist safety;To address functional/ADL transfers PT goals addressed during session: Mobility/safety with mobility;Balance;Strengthening/ROM        AM-PAC OT "6 Clicks" Daily Activity     Outcome Measure   Help from another person eating meals?: A Little Help from another  person taking care of personal grooming?: A Little Help from another person toileting, which includes using toliet, bedpan, or urinal?: A Lot Help from another person bathing (including washing, rinsing, drying)?: A Lot Help from another person to put on and taking off regular upper body clothing?: A Lot Help from another person to put on and taking off regular lower body clothing?: A Lot 6 Click Score: 14    End of Session Equipment Utilized During Treatment: Gait belt;Rolling walker (2 wheels)  OT Visit Diagnosis: Other abnormalities of gait and mobility (R26.89);Muscle weakness (generalized) (M62.81)   Activity Tolerance     Patient Left in bed;with call bell/phone within reach;with bed alarm set   Nurse Communication          Time: 0981-1914 OT Time Calculation (min): 32 min  Charges: OT General Charges $OT Visit: 1 Visit OT Treatments $Self Care/Home Management : 8-22 mins  Thresa Ross, OTS

## 2022-12-01 NOTE — TOC Progression Note (Signed)
Transition of Care (TOC) - Progression Note    Patient Details  Name: Adam Rios. MRN: [id number] Date of Birth: May 14, 1941  Transition of Care [facility]) CM/SW Contact  Marlowe Sax, RN Phone Number: 12/01/2022, 10:01 AM  Clinical Narrative:    Elmarie Shiley with [facility] notified me that they will not have a bed until Friday   Expected Discharge Plan: Skilled Nursing Facility Barriers to Discharge: Continued Medical Work up  Expected Discharge Plan and Services       Living arrangements for the past 2 months: Single Family Home                                       Social Determinants of Health (SDOH) Interventions SDOH Screenings   Food Insecurity: No Food Insecurity (11/28/2022)  Housing: Low Risk  (11/28/2022)  Transportation Needs: No Transportation Needs (11/28/2022)  Utilities: Not At Risk (11/28/2022)  Tobacco Use: Medium Risk (11/30/2022)    Readmission Risk Interventions    08/11/2022    4:53 PM 06/11/2022    2:19 PM 02/12/2022    2:17 PM  Readmission Risk Prevention Plan  Transportation Screening Complete Complete Complete  PCP or Specialist Appt within 3-5 Days  Complete Complete  HRI or Home Care Consult  Complete Complete  Social Work Consult for Recovery Care Planning/Counseling  Complete Complete  Palliative Care Screening  Not Applicable Not Applicable  Medication Review Oceanographer) Complete Complete Referral to Pharmacy  [facility] or Home Care Consult Complete    Skilled Nursing Facility Complete

## 2022-12-01 NOTE — Plan of Care (Signed)
  Problem: Education: Goal: Knowledge of General Education information will improve Description: Including pain rating scale, medication(s)/side effects and non-pharmacologic comfort measures Outcome: Progressing   Problem: Health Behavior/Discharge Planning: Goal: Ability to manage health-related needs will improve Outcome: Progressing   Problem: Clinical Measurements: Goal: Ability to maintain clinical measurements within normal limits will improve Outcome: Progressing   Problem: Nutrition: Goal: Adequate nutrition will be maintained Outcome: Progressing   Problem: Elimination: Goal: Will not experience complications related to bowel motility Outcome: Progressing   

## 2022-12-02 DIAGNOSIS — I739 Peripheral vascular disease, unspecified: Secondary | ICD-10-CM

## 2022-12-02 NOTE — Progress Notes (Signed)
  Subjective: 4 Days Post-Op Procedure(s) (LRB): ARTHROPLASTY BIPOLAR HIP (HEMIARTHROPLASTY) (Left) Patient reports pain as mild to moderate.   Patient is well, and has had no acute complaints or problems Plan is to go Rehab after hospital stay. Negative for chest pain and shortness of breath Fever: no Gastrointestinal: Negative for nausea and vomiting  Objective: Vital signs in last 24 hours: Temp:  [97.7 F (36.5 C)-98.7 F (37.1 C)] 98.5 F (36.9 C) (07/02 2345) Pulse Rate:  [81-85] 82 (07/02 2345) Resp:  [16-17] 17 (07/02 2345) BP: (103-123)/(70-74) 118/70 (07/02 2345) SpO2:  [93 %-95 %] 95 % (07/02 2345)  Intake/Output from previous day:  Intake/Output Summary (Last 24 hours) at 12/02/2022 0727 Last data filed at 12/02/2022 0500 Gross per 24 hour  Intake 1450 ml  Output 1200 ml  Net 250 ml    Intake/Output this shift: No intake/output data recorded.  Labs: Recent Labs    11/30/22 0335 12/01/22 0535  HGB 8.8* 9.0*   Recent Labs    11/30/22 0335 12/01/22 0535  WBC 12.6* 9.4  RBC 3.33* 3.42*  HCT 27.8* 28.1*  PLT 157 171   Recent Labs    11/30/22 0335 12/01/22 0535  NA 135 134*  K 3.9 3.7  CL 99 99  CO2 25 26  BUN 30* 29*  CREATININE 1.27* 0.95  GLUCOSE 109* 96  CALCIUM 8.5* 8.1*   No results for input(s): "LABPT", "INR" in the last 72 hours.    EXAM General - Patient is Alert and Confused Extremity - Neurovascular intact Sensation intact distally Dorsiflexion/Plantar flexion intact Compartment soft Dressing/Incision - clean, dry, scant drainage Motor Function - intact, moving foot and toes well on exam.  Limited transfer ability.  Max assist  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HCC)    Hypertension    Neurogenic bladder    Renal cell carcinoma (HCC) 2005   Renal disorder    Stroke (HCC)     01/17/18, 12/21    Some weakness and cognitive decline    Assessment/Plan: 4 Days Post-Op Procedure(s) (LRB): ARTHROPLASTY BIPOLAR HIP (HEMIARTHROPLASTY) (Left) Principal Problem:   Closed left hip fracture (HCC) Active Problems:   Essential hypertension   UTI (urinary tract infection)   Renal cell carcinoma s/p nephrectomy, left   Chronic diastolic CHF (congestive heart failure) (HCC)   Dyslipidemia   GERD without esophagitis   PAD (peripheral artery disease) (HCC)   History of stroke   Known medical problems   Overweight (BMI 25.0-29.9)   Hyponatremia   Metabolic acidosis   Acute postoperative anemia due to expected blood loss  Estimated body mass index is 29.42 kg/m as calculated from the following:   Height as of this encounter: 6\' 1"  (1.854 m).   Weight as of this encounter: 101.2 kg. Advance diet Up with therapy  Discharge planning: Plan to discharge to rehab with physical therapy and Occupational Therapy.  Plan for Friday.  Follow-up with [facility] orthopedics in 2 weeks for staple removal and x-rays of the left hip  DVT Prophylaxis - Foot Pumps, TED hose, and Plavix Weight-Bearing as tolerated to left leg  Dedra Skeens, PA-C Orthopaedic Surgery 12/02/2022, 7:27 AM

## 2022-12-02 NOTE — Progress Notes (Signed)
Physical Therapy Treatment Patient Details Name: Adam Rios. MRN: [id number] DOB: July 31, 1940 Today's Date: 12/02/2022   History of Present Illness Pt is an 82 year old male s/p  left hip hemiarthroplasty 6/29 after mechanical fall; Pmh significant for chronic diastolic CHF, recurrent UTI secondary to indwelling catheter, hypertension, neurogenic bladder, A-fib with RVR, PAD, history of stroke    PT Comments  Pt made slight improvement with sit<>stand transfer training being able to position left hand on RW and push up from bed with R hand on bed.  Pt continues to be limited with severe pain in LLE with movement and fatigue continuing to require max A +2 for bed mobility and sit<>stand transfers. Instructed pt to attempt side stepping along bed but pt unable to weight shift sufficiently to step.  Pt wold benefit from post acute PT to address functional deficits.        Assistance Recommended at Discharge Frequent or constant Supervision/Assistance  If plan is discharge home, recommend the following:  Can travel by private vehicle    Two people to help with walking and/or transfers;Assistance with cooking/housework;Assistance with feeding;Help with stairs or ramp for entrance;Assist for transportation;Two people to help with bathing/dressing/bathroom   No  Equipment Recommendations  None recommended by PT (TBD in next venue)    Recommendations for Other Services       Precautions / Restrictions Precautions Precautions: Fall;Posterior Hip Precaution Booklet Issued: No Precaution Comments: reviewed posterior hip precautions with pt. Required Braces or Orthoses: Other Brace Other Brace: hip abduction pillow at rest Restrictions Weight Bearing Restrictions: Yes LLE Weight Bearing: Weight bearing as tolerated     Mobility  Bed Mobility Overal bed mobility: Needs Assistance Bed Mobility: Rolling Rolling: Max assist   Supine to sit: Max assist, +2 for physical assistance, +2  for safety/equipment Sit to supine: Max assist, +2 for physical assistance   General bed mobility comments: cuing for initiation & sequencing movement.    Transfers Overall transfer level: Needs assistance Equipment used: Rolling walker (2 wheels) Transfers: Sit to/from Stand Sit to Stand: Max assist, +2 physical assistance           General transfer comment: STS from elevated EOB. Pt attempted STS with 1UE on RW, 1 UE pushing on bed but pt ABLE to transition UE from bed to RW. Pt requires cuing for upright posture, forward vs downward gaze, and anterior pelvic shift but pt unable to perform.    Ambulation/Gait               General Gait Details: unable to sufficiently weight shift to step.   Stairs             Wheelchair Mobility     Tilt Bed    Modified Rankin (Stroke Patients Only)       Balance Overall balance assessment: Needs assistance, History of Falls Sitting-balance support: Feet supported, Bilateral upper extremity supported Sitting balance-Leahy Scale: Fair Sitting balance - Comments: supervision static sitting with BUE support. Postural control: Right lateral lean, Posterior lean Standing balance support: Bilateral upper extremity supported, Reliant on assistive device for balance Standing balance-Leahy Scale: Poor                              Cognition Arousal/Alertness: Awake/alert Behavior During Therapy: WFL for tasks assessed/performed Overall Cognitive Status: No family/caregiver present to determine baseline cognitive functioning Area of Impairment: Orientation, Attention, Memory, Following commands, Safety/judgement, Awareness, Problem  solving                   Current Attention Level: Sustained Memory: Decreased short-term memory, Decreased recall of precautions Following Commands: Follows one step commands with increased time, Follows one step commands inconsistently Safety/Judgement: Decreased awareness of  deficits, Decreased awareness of safety Awareness: Intellectual Problem Solving: Slow processing, Decreased initiation, Difficulty sequencing, Requires verbal cues, Requires tactile cues General Comments: Pt unaware of incontinent BM.        Exercises      General Comments        Pertinent Vitals/Pain Pain Assessment Pain Score: 10-Worst pain ever Pain Location: L hip Pain Descriptors / Indicators: Grimacing, Sore, Guarding, Moaning Pain Intervention(s): Limited activity within patient's tolerance, Monitored during session, Repositioned, Patient requesting pain meds-RN notified    Home Living                          Prior Function            PT Goals (current goals can now be found in the care plan section) Acute Rehab PT Goals Patient Stated Goal: decrease pain PT Goal Formulation: With patient Time For Goal Achievement: 12/13/22 Potential to Achieve Goals: Fair Progress towards PT goals: Progressing toward goals    Frequency    7X/week      PT Plan Current plan remains appropriate    Co-evaluation              AM-PAC PT "6 Clicks" Mobility   Outcome Measure  Help needed turning from your back to your side while in a flat bed without using bedrails?: Total Help needed moving from lying on your back to sitting on the side of a flat bed without using bedrails?: Total Help needed moving to and from a bed to a chair (including a wheelchair)?: Total Help needed standing up from a chair using your arms (e.g., wheelchair or bedside chair)?: Total   Help needed climbing 3-5 steps with a railing? : Total 6 Click Score: 5    End of Session   Activity Tolerance: Patient limited by fatigue;Patient limited by pain Patient left: in bed;with call bell/phone within reach;with bed alarm set Nurse Communication: Mobility status PT Visit Diagnosis: Other abnormalities of gait and mobility (R26.89);Pain;Muscle weakness (generalized) (M62.81);Difficulty in  walking, not elsewhere classified (R26.2) Pain - Right/Left: Left Pain - part of body: Hip     Time: 1020-1040 PT Time Calculation (min) (ACUTE ONLY): 20 min  Charges:    $Therapeutic Activity: 8-22 mins PT General Charges $$ ACUTE PT VISIT: 1 Visit                     Hortencia Conradi, PTA  12/02/22, 11:00 AM

## 2022-12-02 NOTE — Plan of Care (Signed)

## 2022-12-02 NOTE — Plan of Care (Signed)
  Problem: Education: Goal: Knowledge of General Education information will improve Description: Including pain rating scale, medication(s)/side effects and non-pharmacologic comfort measures Outcome: Progressing   Problem: Clinical Measurements: Goal: Ability to maintain clinical measurements within normal limits will improve Outcome: Progressing   Problem: Nutrition: Goal: Adequate nutrition will be maintained Outcome: Progressing   Problem: Coping: Goal: Level of anxiety will decrease Outcome: Progressing   Problem: Pain Managment: Goal: General experience of comfort will improve Outcome: Progressing   

## 2022-12-02 NOTE — TOC Progression Note (Signed)
Transition of Care (TOC) - Progression Note    Patient Details  Name: Adam Rios. MRN: [id number] Date of Birth: 1940/12/20  Transition of Care [facility]) CM/SW Contact  Marlowe Sax, RN Phone Number: 12/02/2022, 9:41 AM  Clinical Narrative:     Egbert Garibaldi and offered a peer to peer, to gain approval to go to [facility], call number [phone] option 5 Physician will need to call by 130 PM, I notified the physician   Expected Discharge Plan: Skilled Nursing Facility Barriers to Discharge: Continued Medical Work up  Expected Discharge Plan and Services       Living arrangements for the past 2 months: Single Family Home                                       Social Determinants of Health (SDOH) Interventions SDOH Screenings   Food Insecurity: No Food Insecurity (11/28/2022)  Housing: Low Risk  (11/28/2022)  Transportation Needs: No Transportation Needs (11/28/2022)  Utilities: Not At Risk (11/28/2022)  Tobacco Use: Medium Risk (11/30/2022)    Readmission Risk Interventions    08/11/2022    4:53 PM 06/11/2022    2:19 PM 02/12/2022    2:17 PM  Readmission Risk Prevention Plan  Transportation Screening Complete Complete Complete  PCP or Specialist Appt within 3-5 Days  Complete Complete  HRI or Home Care Consult  Complete Complete  Social Work Consult for Recovery Care Planning/Counseling  Complete Complete  Palliative Care Screening  Not Applicable Not Applicable  Medication Review Oceanographer) Complete Complete Referral to Pharmacy  [facility] or Home Care Consult Complete    Skilled Nursing Facility Complete

## 2022-12-02 NOTE — Progress Notes (Signed)
Per MD hold metoprolol. Pt's BP is 102/62 and HR is 63.

## 2022-12-02 NOTE — TOC Progression Note (Signed)
Transition of Care (TOC) - Progression Note    Patient Details  Name: Adam Rios. MRN: [id number] Date of Birth: 03-13-1941  Transition of Care [facility]) CM/SW Contact  Marlowe Sax, RN Phone Number: 12/02/2022, 2:18 PM  Clinical Narrative:    Peer to peer completed Ins approved to go to [facility] they will not have a bed until Friday   Expected Discharge Plan: Skilled Nursing Facility Barriers to Discharge: Continued Medical Work up  Expected Discharge Plan and Services       Living arrangements for the past 2 months: Single Family Home                                       Social Determinants of Health (SDOH) Interventions SDOH Screenings   Food Insecurity: No Food Insecurity (11/28/2022)  Housing: Low Risk  (11/28/2022)  Transportation Needs: No Transportation Needs (11/28/2022)  Utilities: Not At Risk (11/28/2022)  Tobacco Use: Medium Risk (11/30/2022)    Readmission Risk Interventions    08/11/2022    4:53 PM 06/11/2022    2:19 PM 02/12/2022    2:17 PM  Readmission Risk Prevention Plan  Transportation Screening Complete Complete Complete  PCP or Specialist Appt within 3-5 Days  Complete Complete  HRI or Home Care Consult  Complete Complete  Social Work Consult for Recovery Care Planning/Counseling  Complete Complete  Palliative Care Screening  Not Applicable Not Applicable  Medication Review Oceanographer) Complete Complete Referral to Pharmacy  [facility] or Home Care Consult Complete    Skilled Nursing Facility Complete

## 2022-12-02 NOTE — Progress Notes (Signed)
  Progress Note   Patient: Adam Rios. VPX:[id number] DOB: 1941/04/12 DOA: 11/27/2022     5 DOS: the patient was seen and examined on 12/02/2022   Brief hospital course: Adam Rios. is a 82 y.o. male with medical history significant for chronic diastolic CHF, recurrent UTI secondary to indwelling catheter, hypertension, neurogenic bladder, A-fib with RVR, PAD, history of stroke, who presents after a fall with left hip pain.  X-ray showed left hip displaced fracture of the left femur neck. Surgery performed 6/29. Patient states that he had a recent increased weakness, some urinary symptoms.  Urine culture grow gram negative rods, history of ESBL, started meropenem on 6/30.  Changed back to Rocephin 7/1 x 3 days.  7/2: Metoprolol held as blood pressure was soft.  Waiting for SNF  Principal Problem:   Closed left hip fracture (HCC) Active Problems:   Chronic diastolic CHF (congestive heart failure) (HCC)   UTI (urinary tract infection)   Renal cell carcinoma s/p nephrectomy, left   Essential hypertension   Dyslipidemia   GERD without esophagitis   PAD (peripheral artery disease) (HCC)   History of stroke   Known medical problems   Overweight (BMI 25.0-29.9)   Hyponatremia   Metabolic acidosis   Acute postoperative anemia due to expected blood loss   Assessment and Plan: Closed left femoral neck fracture. Mechanical fall. Acute blood loss anemia. Post left hip hemiarthroplasty 6/29.   Patient doing well, seen by PT/OT, recommending SNF.  TOC working on placement.  Likely discharge on 7/5 to [facility] Continue as needed pain medicine.  Urinary tract infection due to indwelling Foley catheter secondary to E. coli. Urine culture growing 10,000 colonies of E. coli, he had a recent weakness and urinary symptoms.  Completed antibiotics today.  Chronic diastolic congestive heart failure Patient euvolemic, no exacerbation.   Essential hypertension.   Continue some home  medicines.   History of stroke. Debility. PT and OT working with him while here      Subjective:  No new issues.  Willing to work with therapy.  Requesting some water  Physical Exam: Vitals:   12/01/22 1628 12/01/22 2345 12/02/22 0737 12/02/22 1612  BP: 123/70 118/70 102/62 111/61  Pulse: 85 82 63 (!) 50  Resp: 16 17 17 16   Temp: 98.7 F (37.1 C) 98.5 F (36.9 C) 98.2 F (36.8 C) 98.3 F (36.8 C)  TempSrc:  Oral    SpO2: 94% 95% 94% 97%  Weight:      Height:       General exam: Appears calm and comfortable  Respiratory system: Clear to auscultation. Respiratory effort normal. Cardiovascular system: S1 & S2 heard, RRR. No JVD, murmurs, rubs, gallops or clicks. No pedal edema. Gastrointestinal system: Abdomen is nondistended, soft and nontender. No organomegaly or masses felt. Normal bowel sounds heard. Central nervous system: Alert and oriented. No focal neurological deficits. Extremities: Symmetric 5 x 5 power. Skin: No rashes, lesions or ulcers Psychiatry: Judgement and insight appear normal. Mood & affect appropriate.    Data Reviewed:  Lab results reviewed.  Family Communication: None  Disposition: Status is: Inpatient Remains inpatient appropriate because: Severity of disease, IV treatment.    DVT prophylaxis-SCDs Time spent: 35 minutes  Author: Delfino Lovett, MD 12/02/2022 4:20 PM  For on call review [email or web address].

## 2022-12-03 DIAGNOSIS — R000 Tachycardia, unspecified: Secondary | ICD-10-CM

## 2022-12-03 DIAGNOSIS — E872 Acidosis, unspecified: Secondary | ICD-10-CM

## 2022-12-03 MED ORDER — CLONAZEPAM 1 MG PO TABS
1.0000 mg | ORAL_TABLET | Freq: Two times a day (BID) | ORAL | Status: DC
  Administered 2022-12-03 – 2022-12-04 (×2): 1 mg via ORAL
  Filled 2022-12-03 (×2): qty 1

## 2022-12-03 MED ORDER — TRAMADOL HCL 50 MG PO TABS: 50.0000 mg | ORAL_TABLET | Freq: Two times a day (BID) | ORAL | Status: DC | PRN

## 2022-12-03 MED ORDER — CLONAZEPAM 1 MG PO TABS: 1.0000 mg | ORAL_TABLET | Freq: Two times a day (BID) | ORAL | Status: DC | PRN

## 2022-12-03 MED ORDER — DILTIAZEM HCL 30 MG PO TABS
30.0000 mg | ORAL_TABLET | Freq: Four times a day (QID) | ORAL | Status: DC
  Administered 2022-12-03 – 2022-12-04 (×4): 30 mg via ORAL
  Filled 2022-12-03 (×4): qty 1

## 2022-12-03 MED ORDER — CLONAZEPAM 0.5 MG PO TABS: 0.5000 mg | ORAL_TABLET | Freq: Two times a day (BID) | ORAL | Status: DC

## 2022-12-03 NOTE — Progress Notes (Addendum)
   Subjective: 5 Days Post-Op Procedure(s) (LRB): ARTHROPLASTY BIPOLAR HIP (HEMIARTHROPLASTY) (Left) Patient reports pain as mild.   Patient is well, and has had no acute complaints or problems Denies any CP, SOB, ABD pain. We will continue therapy today.  Plan is to go Skilled nursing facility after hospital stay.  Objective: Vital signs in last 24 hours: Temp:  [98.2 F (36.8 C)-98.3 F (36.8 C)] 98.2 F (36.8 C) (07/04 0747) Pulse Rate:  [50-70] 70 (07/04 0747) Resp:  [16-19] 19 (07/04 0747) BP: (108-125)/(61-82) 125/82 (07/04 0747) SpO2:  [94 %-97 %] 94 % (07/04 0747)  Intake/Output from previous day: 07/03 0701 - 07/04 0700 In: 240 [P.O.:240] Out: 1500 [Urine:1500] Intake/Output this shift: No intake/output data recorded.  Recent Labs    12/01/22 0535  HGB 9.0*   Recent Labs    12/01/22 0535  WBC 9.4  RBC 3.42*  HCT 28.1*  PLT 171   Recent Labs    12/01/22 0535  NA 134*  K 3.7  CL 99  CO2 26  BUN 29*  CREATININE 0.95  GLUCOSE 96  CALCIUM 8.1*   No results for input(s): "LABPT", "INR" in the last 72 hours.  EXAM General - Patient is Alert, Appropriate, and Oriented Extremity - Neurovascular intact Sensation intact distally Intact pulses distally Dorsiflexion/Plantar flexion intact No cellulitis present Compartment soft Dressing - dressing C/D/I and no drainage Motor Function - intact, moving foot and toes well on exam.   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HCC)    Hypertension    Neurogenic bladder    Renal cell carcinoma (HCC) 2005   Renal disorder    Stroke (HCC)     01/17/18, 12/21   Some weakness and cognitive decline    Assessment/Plan:   5 Days Post-Op Procedure(s) (LRB): ARTHROPLASTY BIPOLAR HIP (HEMIARTHROPLASTY) (Left) Principal Problem:   Closed left hip fracture (HCC) Active Problems:    Essential hypertension   UTI (urinary tract infection)   Renal cell carcinoma s/p nephrectomy, left   Chronic diastolic CHF (congestive heart failure) (HCC)   Dyslipidemia   GERD without esophagitis   PAD (peripheral artery disease) (HCC)   History of stroke   Known medical problems   Overweight (BMI 25.0-29.9)   Hyponatremia   Metabolic acidosis   Acute postoperative anemia due to expected blood loss  Estimated body mass index is 29.42 kg/m as calculated from the following:   Height as of this encounter: 6\' 1"  (1.854 m).   Weight as of this encounter: 101.2 kg. Advance diet Up with therapy, weightbearing as tolerated left lower extremity Pain controlled Labs are stable, hemoglobin 9.0, trending up. Vital signs are stable Care management to assist with discharge to skilled nursing facility  Patient will need follow-up with [facility] orthopedics in 2 weeks Resume baseline Plavix and start aspirin 325 mg daily for DVT prophylaxis  DVT Prophylaxis -  aspirin, Plavix, teds, SCDs Weight-Bearing as tolerated to left leg   T. Cranston Neighbor, PA-C [facility] 12/03/2022, 11:56 AM   Patient seen and examined, agree with above plan.  The patient is doing well status post left hip hemiarthroplasty, no concerns at this time.  Pain is controlled.  Discussed DVT prophylaxis, pain medication use, and safe transition to SNF.  All questions answered the patient agrees with above plan.  Reinaldo Berber MD

## 2022-12-03 NOTE — Progress Notes (Signed)
  Progress Note   Patient: Adam Rios. ZOX:[id number] DOB: 24-Sep-1940 DOA: 11/27/2022     6 DOS: the patient was seen and examined on 12/03/2022   Brief hospital course: Adam Rios. is a 82 y.o. male with medical history significant for chronic diastolic CHF, recurrent UTI secondary to indwelling catheter, hypertension, neurogenic bladder, A-fib with RVR, PAD, history of stroke, who presents after a fall with left hip pain.  X-ray showed left hip displaced fracture of the left femur neck. Surgery performed 6/29. Patient states that he had a recent increased weakness, some urinary symptoms.  Urine culture grow gram negative rods, history of ESBL, started meropenem on 6/30.  Changed back to Rocephin 7/1 x 3 days.  7/2 - 7/4 waiting for SNF   Principal Problem:   Closed left hip fracture (HCC) Active Problems:   Chronic diastolic CHF (congestive heart failure) (HCC)   UTI (urinary tract infection)   Renal cell carcinoma s/p nephrectomy, left   Essential hypertension   Dyslipidemia   GERD without esophagitis   PAD (peripheral artery disease) (HCC)   History of stroke   Known medical problems   Overweight (BMI 25.0-29.9)   Hyponatremia   Metabolic acidosis   Acute postoperative anemia due to expected blood loss   Assessment and Plan: Closed left femoral neck fracture. Mechanical fall. Acute blood loss anemia. Post left hip hemiarthroplasty 6/29.   Patient doing well, seen by PT/OT, recommending SNF.  TOC working on placement.  Likely discharge on 7/5 to [facility] Continue as needed pain medicine.  Urinary tract infection due to indwelling Foley catheter secondary to E. coli. Urine culture growing 10,000 colonies of E. coli, he had a recent weakness and urinary symptoms.  Completed antibiotics today.  Chronic diastolic congestive heart failure Patient euvolemic, no exacerbation.   Essential hypertension.   Continue some home medicines.   History of  stroke. Debility. PT and OT working with him while here      Subjective:  No new issues.  He denies any depression, family was concerned about this  Physical Exam: Vitals:   12/02/22 0737 12/02/22 1612 12/02/22 2348 12/03/22 0747  BP: 102/62 111/61 108/63 125/82  Pulse: 63 (!) 50 66 70  Resp: 17 16 17 19   Temp: 98.2 F (36.8 C) 98.3 F (36.8 C) 98.2 F (36.8 C) 98.2 F (36.8 C)  TempSrc:   Oral   SpO2: 94% 97% 97% 94%  Weight:      Height:       General exam: Appears calm and comfortable  Respiratory system: Clear to auscultation. Respiratory effort normal. Cardiovascular system: S1 & S2 heard, RRR. No JVD, murmurs, rubs, gallops or clicks. No pedal edema. Gastrointestinal system: Abdomen is nondistended, soft and nontender. No organomegaly or masses felt. Normal bowel sounds heard. Central nervous system: Alert and oriented. No focal neurological deficits. Extremities: Symmetric 5 x 5 power. Skin: No rashes, lesions or ulcers Psychiatry: Judgement and insight appear normal. Mood & affect appropriate.    Data Reviewed:  Lab results reviewed.  Family Communication: None at bedside  Disposition: Status is: Inpatient Remains inpatient appropriate because: Medically stable, waiting for placement    DVT prophylaxis-SCDs Time spent: 35 minutes  Author: Delfino Lovett, MD 12/03/2022 3:09 PM  For on call review [email or web address].

## 2022-12-03 NOTE — Plan of Care (Signed)
  Problem: Health Behavior/Discharge Planning: Goal: Ability to manage health-related needs will improve Outcome: Progressing   Problem: Clinical Measurements: Goal: Diagnostic test results will improve Outcome: Progressing Goal: Respiratory complications will improve Outcome: Progressing Goal: Cardiovascular complication will be avoided Outcome: Progressing   Problem: Activity: Goal: Risk for activity intolerance will decrease Outcome: Progressing   Problem: Nutrition: Goal: Adequate nutrition will be maintained Outcome: Progressing   Problem: Pain Managment: Goal: General experience of comfort will improve Outcome: Progressing   

## 2022-12-03 NOTE — Progress Notes (Signed)
Physical Therapy Treatment Patient Details Name: Adam Rios. MRN: [id number] DOB: [birth year] Today's Date: 12/03/2022   History of Present Illness Pt is an 82 year old male s/p  left hip hemiarthroplasty 6/29 after mechanical fall; Pmh significant for chronic diastolic CHF, recurrent UTI secondary to indwelling catheter, hypertension, neurogenic bladder, A-fib with RVR, PAD, history of stroke    PT Comments  Pt resting in bed upon PT arrival; pt's daughter left beginning of session.  Pt c/o 6-7/10 L hip pain during session (nurse notified of pt's request for pain meds).  During session pt max assist x2 with bed mobility; mod to max assist x2 to stand from elevated bed height up to RW (x3 trials); and min to mod assist x2 to take x5 steps in place with B LE's (UE support on RW).  Pt requiring pacing and rest breaks d/t pt fatigue.  Pt's HR 80 bpm at rest in bed beginning of session but noted to be 136 bpm at rest in bed end of session (nurse notified immediately and nurse reported she would recheck pt soon once pt able to rest some).  Will continue to focus on strengthening, balance, and progressive functional mobility per pt tolerance.    Assistance Recommended at Discharge Frequent or constant Supervision/Assistance  If plan is discharge home, recommend the following:  Can travel by private vehicle    Two people to help with walking and/or transfers;Assistance with cooking/housework;Assistance with feeding;Help with stairs or ramp for entrance;Assist for transportation;Two people to help with bathing/dressing/bathroom   No  Equipment Recommendations  Other (comment) (TBD at next facility)    Recommendations for Other Services       Precautions / Restrictions Precautions Precautions: Fall;Posterior Hip Required Braces or Orthoses: Other Brace Other Brace: Hip abduction pillow at rest Restrictions Weight Bearing Restrictions: Yes LLE Weight Bearing: Weight bearing as tolerated      Mobility  Bed Mobility Overal bed mobility: Needs Assistance Bed Mobility: Supine to Sit, Sit to Supine     Supine to sit: Max assist, +2 for physical assistance Sit to supine: Max assist, +2 for physical assistance   General bed mobility comments: assist for trunk and B LE's; vc's for technique    Transfers Overall transfer level: Needs assistance Equipment used: Rolling walker (2 wheels) Transfers: Sit to/from Stand Sit to Stand: Mod assist, Max assist, +2 physical assistance, From elevated surface           General transfer comment: x3 sit to stand trials from elevated bed height up to RW; vc's to push UE's off of bed; vc's for LE placement; asisst to initiate stand and come to full upright stand and assist to control descent sitting; vc's for technique    Ambulation/Gait Ambulation/Gait assistance: Min assist, Mod assist, +2 physical assistance Gait Distance (Feet):  (pt took x5 steps in place with B LE's) Assistive device: Rolling walker (2 wheels)   Gait velocity: decreased     General Gait Details: vc's and assist to shift weight to take steps; decreased foot clearance R LE compared to L LE; vc's for technique; assist for balance   Stairs             Wheelchair Mobility     Tilt Bed    Modified Rankin (Stroke Patients Only)       Balance Overall balance assessment: Needs assistance, History of Falls Sitting-balance support: Feet supported, Bilateral upper extremity supported Sitting balance-Leahy Scale: Fair Sitting balance - Comments: steady static sitting  Standing balance support: Bilateral upper extremity supported, Reliant on assistive device for balance Standing balance-Leahy Scale: Poor Standing balance comment: assist for balance in standing                            Cognition Arousal/Alertness: Awake/alert Behavior During Therapy: Anxious Overall Cognitive Status: No family/caregiver present to determine baseline  cognitive functioning Area of Impairment: Orientation, Attention, Memory, Following commands, Safety/judgement, Awareness, Problem solving                 Orientation Level: Disoriented to, Situation Current Attention Level: Sustained Memory: Decreased short-term memory, Decreased recall of precautions Following Commands: Follows one step commands with increased time, Follows one step commands inconsistently Safety/Judgement: Decreased awareness of deficits, Decreased awareness of safety Awareness: Intellectual Problem Solving: Slow processing, Decreased initiation, Difficulty sequencing, Requires verbal cues, Requires tactile cues          Exercises      General Comments General comments (skin integrity, edema, etc.): Drainage noted L hip dressing (PA present beginning of session assessing pt and checking dressing).  Nursing cleared pt for participation in physical therapy.  Pt agreeable to PT session.      Pertinent Vitals/Pain Pain Assessment Pain Assessment: 0-10 Pain Score: 6  Pain Location: L hip Pain Descriptors / Indicators: Grimacing, Sore, Guarding Pain Intervention(s): Limited activity within patient's tolerance, Monitored during session, Premedicated before session, Repositioned, Patient requesting pain meds-RN notified SpO2 sats WFL on room air during session.    Home Living                          Prior Function            PT Goals (current goals can now be found in the care plan section) Acute Rehab PT Goals Patient Stated Goal: decrease pain PT Goal Formulation: With patient Time For Goal Achievement: 12/13/22 Potential to Achieve Goals: Fair Progress towards PT goals: Progressing toward goals    Frequency    7X/week      PT Plan Current plan remains appropriate    Co-evaluation              AM-PAC PT "6 Clicks" Mobility   Outcome Measure  Help needed turning from your back to your side while in a flat bed without  using bedrails?: Total Help needed moving from lying on your back to sitting on the side of a flat bed without using bedrails?: Total Help needed moving to and from a bed to a chair (including a wheelchair)?: Total Help needed standing up from a chair using your arms (e.g., wheelchair or bedside chair)?: Total Help needed to walk in hospital room?: Total Help needed climbing 3-5 steps with a railing? : Total 6 Click Score: 6    End of Session Equipment Utilized During Treatment: Gait belt Activity Tolerance: Patient limited by fatigue;Patient limited by pain Patient left: in bed;with call bell/phone within reach;with bed alarm set;with SCD's reapplied;Other (comment) (hip aBduction pillow in place; B heels floating via towel roll) Nurse Communication: Mobility status;Precautions;Weight bearing status;Patient requests pain meds PT Visit Diagnosis: Other abnormalities of gait and mobility (R26.89);Pain;Muscle weakness (generalized) (M62.81);Difficulty in walking, not elsewhere classified (R26.2) Pain - Right/Left: Left Pain - part of body: Hip     Time: 1610-9604 PT Time Calculation (min) (ACUTE ONLY): 30 min  Charges:    $Therapeutic Activity: 23-37 mins PT General Charges $$ ACUTE  PT VISIT: 1 Visit                     Hendricks Limes, PT 12/03/22, 12:34 PM

## 2022-12-03 NOTE — Plan of Care (Signed)
  Problem: Education: Goal: Knowledge of General Education information will improve Description: Including pain rating scale, medication(s)/side effects and non-pharmacologic comfort measures Outcome: Progressing   Problem: Clinical Measurements: Goal: Ability to maintain clinical measurements within normal limits will improve Outcome: Progressing   Problem: Nutrition: Goal: Adequate nutrition will be maintained Outcome: Progressing   Problem: Coping: Goal: Level of anxiety will decrease Outcome: Progressing   Problem: Pain Managment: Goal: General experience of comfort will improve Outcome: Progressing   

## 2022-12-03 NOTE — TOC Progression Note (Signed)
Transition of Care (TOC) - Progression Note    Patient Details  Name: Adam Rios. MRN: [id number] Date of Birth: 04/07/1941  Transition of Care [facility]) CM/SW Contact  Adam Sax, RN Phone Number: 12/03/2022, 11:41 AM  Clinical Narrative:    [name] notified me that they are not able to offer a bed, We reviewed the bed offers and they chose [name], I called [name] and asked  for [name], they requested that I call her at [phone], I called her and let her know that he asked for [name], She stated that they will accept him Ins has to be switched to [name], I called [name] and left a VM asking to switch and went on Portal and sent message as well   Expected Discharge Plan: Skilled Nursing Facility Barriers to Discharge: Insurance Authorization  Expected Discharge Plan and Services       Living arrangements for the past 2 months: Single Family Home                                       Social Determinants of Health (SDOH) Interventions SDOH Screenings   Food Insecurity: No Food Insecurity (11/28/2022)  Housing: Low Risk  (11/28/2022)  Transportation Needs: No Transportation Needs (11/28/2022)  Utilities: Not At Risk (11/28/2022)  Tobacco Use: Medium Risk (11/30/2022)    Readmission Risk Interventions    08/11/2022    4:53 PM 06/11/2022    2:19 PM 02/12/2022    2:17 PM  Readmission Risk Prevention Plan  Transportation Screening Complete Complete Complete  PCP or Specialist Appt within 3-5 Days  Complete Complete  HRI or Home Care Consult  Complete Complete  Social Work Consult for Recovery Care Planning/Counseling  Complete Complete  Palliative Care Screening  Not Applicable Not Applicable  Medication Review [employer]) Complete Complete Referral to Pharmacy  [facility] or Home Care Consult Complete    Skilled Nursing Facility Complete

## 2022-12-04 DIAGNOSIS — K219 Gastro-esophageal reflux disease without esophagitis: Secondary | ICD-10-CM

## 2022-12-04 MED ORDER — METOCLOPRAMIDE HCL 5 MG PO TABS: 5.0000 mg | ORAL_TABLET | Freq: Three times a day (TID) | ORAL | Status: DC | PRN

## 2022-12-04 MED ORDER — TRAMADOL HCL 50 MG PO TABS
50.0000 mg | ORAL_TABLET | Freq: Four times a day (QID) | ORAL | Status: DC
  Administered 2022-12-04: 50 mg via ORAL
  Filled 2022-12-04: qty 1

## 2022-12-04 MED ORDER — DONEPEZIL HCL 10 MG PO TABS: 10.0000 mg | ORAL_TABLET | Freq: Every day | ORAL | Status: DC

## 2022-12-04 MED ORDER — METOPROLOL SUCCINATE ER 25 MG PO TB24: 25.0000 mg | ORAL_TABLET | Freq: Every day | ORAL | 11 refills | Status: DC

## 2022-12-04 MED ORDER — ONDANSETRON HCL 4 MG PO TABS: 4.0000 mg | ORAL_TABLET | Freq: Four times a day (QID) | ORAL | 0 refills | Status: DC | PRN

## 2022-12-04 MED ORDER — TORSEMIDE 40 MG PO TABS: 40.0000 mg | ORAL_TABLET | Freq: Every day | ORAL | Status: DC

## 2022-12-04 MED ORDER — DOCUSATE SODIUM 100 MG PO CAPS: 100.0000 mg | ORAL_CAPSULE | Freq: Two times a day (BID) | ORAL | 0 refills | Status: DC

## 2022-12-04 NOTE — Progress Notes (Signed)
Report given to Sierra, RN from [facility] 

## 2022-12-04 NOTE — Progress Notes (Signed)
Physical Therapy Treatment Patient Details Name: Adam Rios. MRN: [id number] DOB: 10-10-1940 Today's Date: 12/04/2022   History of Present Illness Pt is an 82 year old male s/p  left hip hemiarthroplasty 6/29 after mechanical fall; Pmh significant for chronic diastolic CHF, recurrent UTI secondary to indwelling catheter, hypertension, neurogenic bladder, A-fib with RVR, PAD, history of stroke    PT Comments  Pt was long sitting in bed, awake, and oriented x 3. Unable to recall hip precautions however seems to be at baseline cognitively. Author knows pt from previous admissions. Pt was motivated and endorses no pain at rest. Was able to tolerate getting OOB and taking a few antalgic steps to recliner. Recommend continued +2 assistance for any/all mobility/transfers/ and gait. Pt is agreeable to STR at DC. Acute PT will continue to follow and progress per current POC.       Assistance Recommended at Discharge Frequent or constant Supervision/Assistance  If plan is discharge home, recommend the following:  Can travel by private vehicle    Two people to help with walking and/or transfers;Assistance with cooking/housework;Assistance with feeding;Help with stairs or ramp for entrance;Assist for transportation;Two people to help with bathing/dressing/bathroom      Equipment Recommendations  Other (comment) (Defer to next level of care)       Precautions / Restrictions Precautions Precautions: Fall;Posterior Hip Precaution Booklet Issued: No Precaution Comments: reviewed posterior hip precautions with pt. Other Brace: Hip abduction pillow at rest Restrictions Weight Bearing Restrictions: Yes LLE Weight Bearing: Weight bearing as tolerated     Mobility  Bed Mobility Overal bed mobility: Needs Assistance Bed Mobility: Supine to Sit  Supine to sit: Max assist, HOB elevated  General bed mobility comments: increased time to perform with vcs for technique and sequencing.     Transfers Overall transfer level: Needs assistance Equipment used: Rolling walker (2 wheels) Transfers: Sit to/from Stand Sit to Stand: From elevated surface, Max assist  General transfer comment: Max assist to stand form elevated bed height wioth vcs for handplacement and overall technique improvements. author had to block feet from sliding. Once in standing, pt was able to static stand with min-mod assist.    Ambulation/Gait Ambulation/Gait assistance: Mod assist Gait Distance (Feet): 4 Feet Assistive device: Rolling walker (2 wheels) Gait Pattern/deviations: Step-to pattern, Antalgic, Trunk flexed Gait velocity: decreased  General Gait Details: Pt was able to advance to taking steps form EOB to recliner. Vcs for posture correction and overall sequencing. pt fatigued quickly. HR hit 137bpm. HR at rest was 117bpm. Pt endoerses feeling well once repositioned in recliner.    Balance Overall balance assessment: Needs assistance, History of Falls Sitting-balance support: Feet supported, Bilateral upper extremity supported Sitting balance-Leahy Scale: Poor Sitting balance - Comments: Has several occasions of posterior LOB while sitting EOB with intervention to prevent falling backwards   Standing balance support: Bilateral upper extremity supported, During functional activity, Reliant on assistive device for balance Standing balance-Leahy Scale: Poor Standing balance comment: Pt remains high fall risk       Cognition Arousal/Alertness: Awake/alert Behavior During Therapy: WFL for tasks assessed/performed Overall Cognitive Status: No family/caregiver present to determine baseline cognitive functioning    Following Commands: Follows one step commands with increased time, Follows one step commands inconsistently Safety/Judgement: Decreased awareness of safety, Decreased awareness of deficits     General Comments: Pt is A and O x 3. Unaware of hip precautions but did adhere during  session with vcing  General Comments General comments (skin integrity, edema, etc.): Lengthy education on importance of exercises and maintaining hip precautions. will need continued rview of exercises and hip prcautions. Poor carryover between sessions.      Pertinent Vitals/Pain Pain Assessment Pain Assessment: No/denies pain Pain Score: 0-No pain Pain Location: L hip Pain Descriptors / Indicators: Grimacing, Sore, Guarding Pain Intervention(s): Limited activity within patient's tolerance, Monitored during session, Premedicated before session, Repositioned     PT Goals (current goals can now be found in the care plan section) Acute Rehab PT Goals Patient Stated Goal: walk again so I can return home after rehab Progress towards PT goals: Progressing toward goals    Frequency    7X/week      PT Plan Current plan remains appropriate    Co-evaluation     PT goals addressed during session: Mobility/safety with mobility;Balance;Proper use of DME;Strengthening/ROM        AM-PAC PT "6 Clicks" Mobility   Outcome Measure  Help needed turning from your back to your side while in a flat bed without using bedrails?: A Lot Help needed moving from lying on your back to sitting on the side of a flat bed without using bedrails?: A Lot Help needed moving to and from a bed to a chair (including a wheelchair)?: A Lot Help needed standing up from a chair using your arms (e.g., wheelchair or bedside chair)?: A Lot Help needed to walk in hospital room?: A Lot Help needed climbing 3-5 steps with a railing? : Total 6 Click Score: 11    End of Session Equipment Utilized During Treatment: Gait belt Activity Tolerance: Patient tolerated treatment well;Patient limited by fatigue Patient left: in chair;with call bell/phone within reach;with chair alarm set;with nursing/sitter in room Nurse Communication: Mobility status;Precautions;Weight bearing status;Patient requests pain  meds PT Visit Diagnosis: Other abnormalities of gait and mobility (R26.89);Pain;Muscle weakness (generalized) (M62.81);Difficulty in walking, not elsewhere classified (R26.2) Pain - Right/Left: Left Pain - part of body: Hip     Time: 1610-9604 PT Time Calculation (min) (ACUTE ONLY): 37 min  Charges:    $Therapeutic Activity: 23-37 mins PT General Charges $$ ACUTE PT VISIT: 1 Visit                     Jetta Lout PTA 12/04/22, 9:10 AM

## 2022-12-04 NOTE — Care Management Important Message (Signed)
Important Message  Patient Details  Name: Adam Rios. MRN: [id number] Date of Birth: 10-May-1941   Medicare Important Message Given:  Yes     Olegario Messier A [place] 12/04/2022, 12:18 PM

## 2022-12-04 NOTE — Discharge Summary (Signed)
Physician Discharge Summary   Patient: Adam Rios. MRN: [id number] DOB: 08-16-1940  Admit date:     11/27/2022  Discharge date: 12/04/22  Discharge Physician: Delfino Lovett   PCP: Laqueta Due, MD   Recommendations at discharge:    F/up with outpt providers as requested  Discharge Diagnoses: Principal Problem:   Closed left hip fracture [facility]) Active Problems:   Chronic diastolic CHF (congestive heart failure) (HCC)   UTI (urinary tract infection)   Renal cell carcinoma s/p nephrectomy, left   Essential hypertension   Dyslipidemia   GERD without esophagitis   PAD (peripheral artery disease) (HCC)   History of stroke   Known medical problems   Overweight (BMI 25.0-29.9)   Hyponatremia   Metabolic acidosis   Acute postoperative anemia due to expected blood loss  Hospital Course: Adam Rios. is a 82 y.o. male with medical history significant for chronic diastolic CHF, recurrent UTI secondary to indwelling catheter, hypertension, neurogenic bladder, A-fib with RVR, PAD, history of stroke, who presents after a fall with left hip pain.  X-ray showed left hip displaced fracture of the left femur neck. Surgery performed 6/29. Patient states that he had a recent increased weakness, some urinary symptoms.  Urine culture grow gram negative rods, history of ESBL, started meropenem on 6/30.  Changed back to Rocephin 7/1 x 3 days.  Assessment and Plan:  Closed left femoral neck fracture. Mechanical fall. Acute blood loss anemia. Post left hip hemiarthroplasty 6/29.   Patient doing well, seen by PT/OT, recommending SNF. Being DCed to [facility] today   Urinary tract infection due to indwelling Foley catheter secondary to E. coli. Urine culture growing 10,000 colonies of E. coli, he had a recent weakness and urinary symptoms.  Completed antibiotics.   Chronic diastolic congestive heart failure Patient euvolemic, no exacerbation.   Essential hypertension.   Continue  some home medicines.   History of stroke. Debility. PT and OT working with him while here  Known medical problems Hyperlipidemia-continue atorvastatin BPH-continue finasteride, tamsulosin Neuropathy-continue gabapentin CHF-continue Toprol, aspirin and Plavix OAB-continue Myrbetriq GERD-continue PPI Hypertension-continue spironolactone         Consultants: Ortho Procedures performed: Left hip hemiarthroplasty on 6/29  Disposition: Skilled nursing facility Diet recommendation:  Discharge Diet Orders (From admission, onward)     Start     Ordered   12/04/22 0000  Diet - low sodium heart healthy        12/04/22 1036           Carb modified diet DISCHARGE MEDICATION: Allergies as of 12/04/2022       Reactions   Empagliflozin    Recurrent UTI with sepsis on jardiance.   Fentanyl Nausea Only   Oxycodone Nausea And Vomiting        Medication List     STOP taking these medications    cyclobenzaprine 5 MG tablet Commonly known as: FLEXERIL   diclofenac Sodium 1 % Gel Commonly known as: VOLTAREN   polyethylene glycol 17 g packet Commonly known as: MIRALAX / GLYCOLAX   potassium chloride SA 20 MEQ tablet Commonly known as: KLOR-CON M       TAKE these medications    acetaminophen 325 MG tablet Commonly known as: TYLENOL Take 500 mg by mouth every 6 (six) hours as needed.   aspirin EC 81 MG tablet Take 81 mg by mouth daily as needed.   atorvastatin 40 MG tablet Commonly known as: LIPITOR Take 40 mg by mouth daily.  clopidogrel 75 MG tablet Commonly known as: PLAVIX Take 75 mg by mouth daily.   [employer] Cortisone Maximum Strength 1 % Generic drug: hydrocortisone cream Apply 1 Application topically as needed for itching.   cyanocobalamin 1000 MCG tablet Commonly known as: VITAMIN B12 Take 1,000 mcg by mouth daily.   docusate sodium 100 MG capsule Commonly known as: COLACE Take 1 capsule (100 mg total) by mouth 2 (two) times daily.    donepezil 10 MG tablet Commonly known as: ARICEPT Take 1 tablet (10 mg total) by mouth at bedtime.   finasteride 5 MG tablet Commonly known as: PROSCAR Take 5 mg by mouth daily.   folic acid 1 MG tablet Commonly known as: FOLVITE Take 1 tablet (1 mg total) by mouth daily.   gabapentin 300 MG capsule Commonly known as: NEURONTIN Take 1 capsule (300 mg total) by mouth at bedtime.   iron polysaccharides 150 MG capsule Commonly known as: NIFEREX Take 1 capsule (150 mg total) by mouth daily.   metoCLOPramide 5 MG tablet Commonly known as: REGLAN Take 1-2 tablets (5-10 mg total) by mouth every 8 (eight) hours as needed for nausea (if ondansetron (ZOFRAN) ineffective.).   metoprolol succinate 25 MG 24 hr tablet Commonly known as: TOPROL-XL Take 1 tablet (25 mg total) by mouth daily. What changed: how much to take   mirabegron ER 50 MG Tb24 tablet Commonly known as: MYRBETRIQ Take 1 tablet (50 mg total) by mouth daily.   ondansetron 4 MG tablet Commonly known as: ZOFRAN Take 1 tablet (4 mg total) by mouth every 6 (six) hours as needed for nausea.   oxyCODONE 5 MG immediate release tablet Commonly known as: Oxy IR/ROXICODONE Take 0.5-1 tablets (2.5-5 mg total) by mouth every 4 (four) hours as needed for moderate pain (pain score 4-6).   pantoprazole 20 MG tablet Commonly known as: PROTONIX Take 1 tablet (20 mg total) by mouth daily.   senna-docusate 8.6-50 MG tablet Commonly known as: Senokot-S Take 1 tablet by mouth 2 (two) times daily as needed.   spironolactone 25 MG tablet Commonly known as: ALDACTONE Take 1 tablet by mouth daily.   tamsulosin 0.4 MG Caps capsule Commonly known as: FLOMAX Take 1 capsule by mouth daily.   Torsemide 40 MG Tabs Take 40 mg by mouth daily. Start taking on: December 05, 2022 What changed:  medication strength when to take this   traMADol 50 MG tablet Commonly known as: ULTRAM Take 1 tablet (50 mg total) by mouth every 6 (six) hours  as needed for moderate pain.   Vitamin D (Ergocalciferol) 1.25 MG (50000 UNIT) Caps capsule Commonly known as: DRISDOL Take 1 capsule (50,000 Units total) by mouth every 7 (seven) days.        Contact information for follow-up providers     Dedra Skeens, PA-C Follow up in 2 week(s).   Specialty: Orthopedic Surgery Why: For staple removal and x-rays of the left hip Contact information: [address] [place] [address] [phone]              Contact information for after-discharge care     Destination     [facility] Preferred SNF .   Service: Skilled Nursing Contact information: [address] [place] [address] [phone]                    Discharge Exam: Ceasar Mons Weights   11/27/22 1256  Weight: 101.2 kg  General exam: Appears calm and comfortable  Respiratory system: Clear to auscultation. Respiratory effort normal. Cardiovascular system: S1 & S2 heard, RRR. No JVD, murmurs, rubs, gallops or clicks. No pedal edema. Gastrointestinal system: Abdomen is nondistended, soft and nontender. No organomegaly or masses felt. Normal bowel sounds heard. Central nervous system: Alert and oriented. No focal neurological deficits. Extremities: Symmetric 5 x 5 power. Skin: No rashes, lesions or ulcers Psychiatry: Judgement and insight appear normal. Mood & affect appropriate.   Condition at discharge: fair  The results of significant diagnostics from this hospitalization (including imaging, microbiology, ancillary and laboratory) are listed below for reference.   Imaging Studies: DG Pelvis Portable  Result Date: 11/28/2022 CLINICAL DATA:  Post left hip arthroplasty. EXAM: PORTABLE PELVIS 1-2 VIEWS COMPARISON:  11/27/2022 FINDINGS: Interval placement of left hip arthroplasty which is intact and normally located. Skin staples over the lateral soft tissues of the left hip. Remainder  of the exam is unchanged. IMPRESSION: Left hip arthroplasty without acute postoperative complication. Electronically Signed   By: Elberta Fortis M.D.   On: 11/28/2022 15:09   DG HIP PORT UNILAT WITH PELVIS 1V LEFT  Result Date: 11/28/2022 CLINICAL DATA:  Intraoperative radiograph left hip.  Fracture. EXAM: DG HIP (WITH OR WITHOUT PELVIS) 1V PORT LEFT COMPARISON:  Pelvis and left hip radiographs 11/27/2022 FINDINGS: Intraoperative radiograph demonstrating initial placement of left femoral stem prosthesis with proximal aspect aligned with the left acetabulum. The left femoral head and neck have been resected. Intra-articular and subcutaneous air about the left hip. Mild-to-moderate atherosclerotic calcifications. IMPRESSION: Intraoperative radiograph demonstrating placement of left femoral stem prosthesis for planned arthroplasty. Electronically Signed   By: Neita Garnet M.D.   On: 11/28/2022 10:31   DG Chest 1 View  Result Date: 11/27/2022 CLINICAL DATA:  Trauma, fall EXAM: CHEST  1 VIEW COMPARISON:  09/29/2022 FINDINGS: Transverse diameter of heart is increased. There are no signs of pulmonary edema or focal pulmonary consolidation. There is no pleural effusion or pneumothorax. Patient's chin is partially obscuring the apices. Pacemaker battery is seen in the left infraclavicular region. There is linear metallic density overlying the left hilum, possibly intravascular structure from previous vascular intervention or monitoring device. IMPRESSION: There are no signs of pulmonary edema or focal pulmonary consolidation. Electronically Signed   By: Ernie Avena M.D.   On: 11/27/2022 14:19   DG Knee Complete 4 Views Left  Result Date: 11/27/2022 CLINICAL DATA:  Trauma, fall EXAM: LEFT KNEE - COMPLETE 4+ VIEW COMPARISON:  None Available. FINDINGS: No displaced fracture or dislocation is seen. There is no significant effusion. In 2 of the images, there is questionable irregularity in the medial aspect of  medial tibial plateau. Bony spurs are seen in patella. Arterial calcifications are seen in soft tissues. IMPRESSION: No displaced fracture or dislocation is seen. There is questionable minimal cortical irregularity in the medial aspect of medial tibial plateau which may be a normal variation or residual change from previous injury. Less likely possibility would be recent undisplaced fracture. There is no significant effusion in the left knee joint. Degenerative changes are noted in the patella with bony spurs. Arteriosclerosis. Electronically Signed   By: Ernie Avena M.D.   On: 11/27/2022 14:14   DG Elbow Complete Left  Result Date: 11/27/2022 CLINICAL DATA:  Trauma, fall EXAM: LEFT ELBOW - COMPLETE 3+ VIEW COMPARISON:  None Available. FINDINGS: No recent fracture or dislocation is seen. There is 1 mm smoothly marginated calcification adjacent to the olecranon process. There is no  significant displacement of posterior fat pad. Small bony spurs are noted in olecranon and coronoid processes and proximal ulna. IMPRESSION: No recent fracture or dislocation is seen in left elbow. 1 mm smoothly marginated calcification adjacent to the olecranon process may suggest calcific tendinosis. Degenerative changes with bony spurs are noted in proximal ulna. Electronically Signed   By: Ernie Avena M.D.   On: 11/27/2022 14:11   DG Hip Unilat With Pelvis 2-3 Views Left  Result Date: 11/27/2022 CLINICAL DATA:  Trauma, fall EXAM: DG HIP (WITH OR WITHOUT PELVIS) 2-3V LEFT COMPARISON:  None Available. FINDINGS: Fracture is seen in the neck cough left femur. There is superior displacement of greater trochanter in relation to the head. There is overriding of fracture fragments. There is no dislocation. There is possible minimal flattening of right femoral head. There is possible cortical irregularity in the lateral margin of right ischium. IMPRESSION: Displaced fracture is seen in the neck of left femur. There is no  dislocation. There is possible minimal flattening of right femoral head. There is possible cortical irregularity in the lateral margin of right ischium. These findings are not optimally evaluated. If clinically warranted, routine images of right hip may be considered. Electronically Signed   By: Ernie Avena M.D.   On: 11/27/2022 14:09    Microbiology: Results for orders placed or performed during the hospital encounter of 11/27/22  Remove and replace urinary cath (placed > 5 days) then obtain urine culture from new indwelling urinary catheter.     Status: Abnormal   Collection Time: 11/28/22  5:20 AM   Specimen: Urine, Catheterized  Result Value Ref Range Status   Specimen Description   Final    URINE, CATHETERIZED Performed at [facility], [address] [place], [place] [address]    Special Requests   Final    Normal Performed at [facility], [address] [place], [place] [address]    Culture 10,000 COLONIES/mL ESCHERICHIA COLI (A)  Final   Report Status 11/30/2022 FINAL  Final   Organism ID, Bacteria ESCHERICHIA COLI (A)  Final      Susceptibility   Escherichia coli - MIC*    AMPICILLIN <=2 SENSITIVE Sensitive     CEFAZOLIN <=4 SENSITIVE Sensitive     CEFEPIME <=0.12 SENSITIVE Sensitive     CEFTRIAXONE <=0.25 SENSITIVE Sensitive     CIPROFLOXACIN >=4 RESISTANT Resistant     GENTAMICIN <=1 SENSITIVE Sensitive     IMIPENEM <=0.25 SENSITIVE Sensitive     NITROFURANTOIN <=16 SENSITIVE Sensitive     TRIMETH/SULFA >=320 RESISTANT Resistant     AMPICILLIN/SULBACTAM <=2 SENSITIVE Sensitive     PIP/TAZO <=4 SENSITIVE Sensitive     * 10,000 COLONIES/mL ESCHERICHIA COLI    Labs: CBC: Recent Labs  Lab 11/27/22 1304 11/29/22 0441 11/30/22 0335 12/01/22 0535  WBC 6.2 15.2* 12.6* 9.4  NEUTROABS 4.3  --   --   --   HGB 10.2* 8.7* 8.8* 9.0*  HCT 32.8* 27.6* 27.8* 28.1*  MCV 86.1 84.4 83.5 82.2  PLT 170 153 157 171   Basic Metabolic Panel: Recent  Labs  Lab 11/27/22 1304 11/29/22 0441 11/30/22 0335 12/01/22 0535  NA 131* 132* 135 134*  K 4.4 4.4 3.9 3.7  CL 103 101 99 99  CO2 20* 22 25 26   GLUCOSE 125* 149* 109* 96  BUN 25* 24* 30* 29*  CREATININE 1.07 1.21 1.27* 0.95  CALCIUM 8.3* 8.1* 8.5* 8.1*  MG  --  1.7  --  2.0   Liver Function Tests: Recent Labs  Lab 11/27/22 1304  AST 20  ALT 18  ALKPHOS 61  BILITOT 0.6  PROT 6.3*  ALBUMIN 3.2*   CBG: Recent Labs  Lab 11/30/22 2023  GLUCAP 117*    Discharge time spent: greater than 30 minutes.  Signed: Delfino Lovett, MD [facility] 12/04/2022

## 2022-12-04 NOTE — Care Management Important Message (Signed)
Important Message  Patient Details  Name: Adam Rios. MRN: [id number] Date of Birth: 03-26-1941   Medicare Important Message Given:  Other (see comment)  Patient is in an isolation room but keep ringing, I checked with the nursing station to see if the phone was in patient's reach. She said patient was sleeping and no family in the room. She will make sure phone is within his reach and I will try again shortly.   Olegario Messier A Elayna Tobler 12/04/2022, 11:12 AM

## 2022-12-04 NOTE — Plan of Care (Signed)
  Problem: Clinical Measurements: Goal: Diagnostic test results will improve Outcome: Progressing Goal: Respiratory complications will improve Outcome: Progressing Goal: Cardiovascular complication will be avoided Outcome: Progressing   Problem: Nutrition: Goal: Adequate nutrition will be maintained Outcome: Progressing   Problem: Pain Managment: Goal: General experience of comfort will improve Outcome: Progressing   

## 2022-12-04 NOTE — TOC Progression Note (Signed)
Transition of Care (TOC) - Progression Note    Patient Details  Name: Adam Rios. MRN: [id number] Date of Birth: [birth year]  Transition of Care [facility]) CM/SW Contact  Marlowe Sax, RN Phone Number: 12/04/2022, 11:33 AM  Clinical Narrative:  Called daughter [name], left a general VM for a call back, Patient to DC today to room [facility]P at [facility], EMS called for transport   Expected Discharge Plan: Skilled Nursing Facility Barriers to Discharge: Insurance Authorization  Expected Discharge Plan and Services       Living arrangements for the past 2 months: Single Family Home Expected Discharge Date: 12/04/22                                     Social Determinants of Health (SDOH) Interventions SDOH Screenings   Food Insecurity: No Food Insecurity (11/28/2022)  Housing: Low Risk  (11/28/2022)  Transportation Needs: No Transportation Needs (11/28/2022)  Utilities: Not At Risk (11/28/2022)  Tobacco Use: Medium Risk (11/30/2022)    Readmission Risk Interventions    08/11/2022    4:53 PM 06/11/2022    2:19 PM 02/12/2022    2:17 PM  Readmission Risk Prevention Plan  Transportation Screening Complete Complete Complete  PCP or Specialist Appt within 3-5 Days  Complete Complete  HRI or Home Care Consult  Complete Complete  Social Work Consult for Recovery Care Planning/Counseling  Complete Complete  Palliative Care Screening  Not Applicable Not Applicable  Medication Review [employer]) Complete Complete Referral to Pharmacy  [facility] or Home Care Consult Complete    Skilled Nursing Facility Complete

## 2022-12-28 ENCOUNTER — Other Ambulatory Visit: Payer: Self-pay

## 2022-12-28 ENCOUNTER — Encounter: Payer: Self-pay | Admitting: Intensive Care

## 2022-12-28 ENCOUNTER — Emergency Department
Admission: EM | Admit: 2022-12-28 | Discharge: 2022-12-28 | Disposition: A | Discharge status: Home / Self Care | Payer: Medicare Other | Attending: Emergency Medicine | Admitting: Emergency Medicine

## 2022-12-28 DIAGNOSIS — T83021A Displacement of indwelling urethral catheter, initial encounter: Secondary | ICD-10-CM | POA: Diagnosis present

## 2022-12-28 DIAGNOSIS — T839XXA Unspecified complication of genitourinary prosthetic device, implant and graft, initial encounter: Secondary | ICD-10-CM

## 2022-12-28 DIAGNOSIS — Y732 Prosthetic and other implants, materials and accessory gastroenterology and urology devices associated with adverse incidents: Secondary | ICD-10-CM | POA: Diagnosis not present

## 2022-12-28 LAB — CBC WITH DIFFERENTIAL/PLATELET
Abs Immature Granulocytes: 0.05 10*3/uL (ref 0.00–0.07)
Basophils Absolute: 0 10*3/uL (ref 0.0–0.1)
Basophils Relative: 0 %
Eosinophils Absolute: 0.1 10*3/uL (ref 0.0–0.5)
Eosinophils Relative: 1 %
HCT: 37.3 % — ABNORMAL LOW (ref 39.0–52.0)
Hemoglobin: 11.4 g/dL — ABNORMAL LOW (ref 13.0–17.0)
Immature Granulocytes: 1 %
Lymphocytes Relative: 20 %
Lymphs Abs: 1.5 10*3/uL (ref 0.7–4.0)
MCH: 25.9 pg — ABNORMAL LOW (ref 26.0–34.0)
MCHC: 30.6 g/dL (ref 30.0–36.0)
MCV: 84.6 fL (ref 80.0–100.0)
Monocytes Absolute: 0.5 10*3/uL (ref 0.1–1.0)
Monocytes Relative: 7 %
Neutro Abs: 5.3 10*3/uL (ref 1.7–7.7)
Neutrophils Relative %: 71 %
Platelets: 247 10*3/uL (ref 150–400)
RBC: 4.41 MIL/uL (ref 4.22–5.81)
RDW: 15.9 % — ABNORMAL HIGH (ref 11.5–15.5)
WBC: 7.5 10*3/uL (ref 4.0–10.5)
nRBC: 0 % (ref 0.0–0.2)

## 2022-12-28 LAB — COMPREHENSIVE METABOLIC PANEL
ALT: 9 U/L (ref 0–44)
AST: 19 U/L (ref 15–41)
Albumin: 3.6 g/dL (ref 3.5–5.0)
Alkaline Phosphatase: 87 U/L (ref 38–126)
Anion gap: 12 (ref 5–15)
BUN: 13 mg/dL (ref 8–23)
CO2: 22 mmol/L (ref 22–32)
Calcium: 8.9 mg/dL (ref 8.9–10.3)
Chloride: 102 mmol/L (ref 98–111)
Creatinine, Ser: 1.07 mg/dL (ref 0.61–1.24)
GFR, Estimated: >60 mL/min (ref >60)
Glucose, Bld: 133 mg/dL — ABNORMAL HIGH (ref 70–99)
Potassium: 3.4 mmol/L — ABNORMAL LOW (ref 3.5–5.1)
Sodium: 136 mmol/L (ref 135–145)
Total Bilirubin: 0.7 mg/dL (ref 0.3–1.2)
Total Protein: 7.4 g/dL (ref 6.5–8.1)

## 2022-12-28 NOTE — ED Triage Notes (Signed)
Patient presents with blood in foley catheter. Reports he was walking with his walker and the wheel rolled over his catheter tubing and then bloody drainage started.

## 2022-12-28 NOTE — ED Notes (Signed)
First Nurse Note: Patient to ED via [facility] from home after accidentally pulling on urinary catheter with wheelchair. Blood noted in tubing per EMS with clots. Having more pain than normal.

## 2022-12-28 NOTE — ED Notes (Addendum)
No urine returned  Catheter removed. Urology?

## 2022-12-28 NOTE — ED Provider Notes (Signed)
   [facility] Provider Note    Event Date/Time   First MD Initiated Contact with Patient 12/28/22 1050     (approximate)   History   blood in catheter   HPI  Adam Rios. is a 82 y.o. male with chronic indwelling Foley catheter.  Patient reports he stepped on tube today and now having hematuria and discomfort     Physical Exam   Triage Vital Signs: ED Triage Vitals [12/28/22 0953]  Encounter Vitals Group     BP 118/75     Systolic BP Percentile      Diastolic BP Percentile      Pulse Rate (!) 128     Resp 18     Temp 98.2 F (36.8 C)     Temp Source Oral     SpO2 98 %     Weight 90.7 kg (200 lb)     Height 1.854 m (6\' 1" )     Head Circumference      Peak Flow      Pain Score 7     Pain Loc      Pain Education      Exclude from Growth Chart     Most recent vital signs: Vitals:   12/28/22 0953  BP: 118/75  Pulse: (!) 128  Resp: 18  Temp: 98.2 F (36.8 C)  SpO2: 98%     General: Awake, no distress.  CV:  Good peripheral perfusion.  Resp:  Normal effort.  Abd:  No distention.  Other:  Nurse reports Foley catheter balloon primarily in penis, removed   ED Results / Procedures / Treatments   Labs (all labs ordered are listed, but only abnormal results are displayed) Labs Reviewed  CBC WITH DIFFERENTIAL/PLATELET - Abnormal; Notable for the following components:      Result Value   Hemoglobin 11.4 (*)    HCT 37.3 (*)    MCH 25.9 (*)    RDW 15.9 (*)    All other components within normal limits  COMPREHENSIVE METABOLIC PANEL - Abnormal; Notable for the following components:   Potassium 3.4 (*)    Glucose, Bld 133 (*)    All other components within normal limits     EKG     RADIOLOGY     PROCEDURES:  Critical Care performed:   Procedures   MEDICATIONS ORDERED IN ED: Medications - No data to display   IMPRESSION / MDM / ASSESSMENT AND PLAN / ED COURSE  I reviewed the triage vital signs and the  nursing notes. Patient's presentation is most consistent with exacerbation of chronic illness.  Patient presents with Foley catheter displacement as described above.  Likely the cause of hematuria.  He has a chronic indwelling catheter.  This is not consistent with infection.  Will replace catheter  Initial catheter placement without urinary return, this was removed and coud catheter used successfully        FINAL CLINICAL IMPRESSION(S) / ED DIAGNOSES   Final diagnoses:  Foley catheter problem, initial encounter [facility])     Rx / DC Orders   ED Discharge Orders     None        Note:  This document was prepared using Dragon voice recognition software and may include unintentional dictation errors.   Jene Every, MD 12/28/22 1726

## 2023-06-14 ENCOUNTER — Observation Stay
Admission: EM | Admit: 2023-06-14 | Discharge: 2023-06-16 | Disposition: A | Discharge status: Skilled Nursing Facility | Payer: Medicare (Managed Care) | Attending: Internal Medicine | Admitting: Internal Medicine

## 2023-06-14 ENCOUNTER — Encounter: Payer: Self-pay | Admitting: Emergency Medicine

## 2023-06-14 ENCOUNTER — Other Ambulatory Visit: Payer: Self-pay

## 2023-06-14 DIAGNOSIS — N319 Neuromuscular dysfunction of bladder, unspecified: Secondary | ICD-10-CM | POA: Insufficient documentation

## 2023-06-14 DIAGNOSIS — I5032 Chronic diastolic (congestive) heart failure: Secondary | ICD-10-CM | POA: Diagnosis not present

## 2023-06-14 DIAGNOSIS — Z87891 Personal history of nicotine dependence: Secondary | ICD-10-CM | POA: Diagnosis not present

## 2023-06-14 DIAGNOSIS — T83010A Breakdown (mechanical) of cystostomy catheter, initial encounter: Secondary | ICD-10-CM | POA: Diagnosis not present

## 2023-06-14 DIAGNOSIS — Z7902 Long term (current) use of antithrombotics/antiplatelets: Secondary | ICD-10-CM | POA: Insufficient documentation

## 2023-06-14 DIAGNOSIS — Z7982 Long term (current) use of aspirin: Secondary | ICD-10-CM | POA: Diagnosis not present

## 2023-06-14 DIAGNOSIS — Z85520 Personal history of malignant carcinoid tumor of kidney: Secondary | ICD-10-CM | POA: Diagnosis not present

## 2023-06-14 DIAGNOSIS — N138 Other obstructive and reflux uropathy: Secondary | ICD-10-CM | POA: Diagnosis present

## 2023-06-14 DIAGNOSIS — Z79899 Other long term (current) drug therapy: Secondary | ICD-10-CM | POA: Diagnosis not present

## 2023-06-14 DIAGNOSIS — F32A Depression, unspecified: Secondary | ICD-10-CM

## 2023-06-14 DIAGNOSIS — I110 Hypertensive heart disease with heart failure: Secondary | ICD-10-CM | POA: Diagnosis not present

## 2023-06-14 DIAGNOSIS — N401 Enlarged prostate with lower urinary tract symptoms: Secondary | ICD-10-CM | POA: Diagnosis not present

## 2023-06-14 DIAGNOSIS — K219 Gastro-esophageal reflux disease without esophagitis: Secondary | ICD-10-CM | POA: Diagnosis not present

## 2023-06-14 DIAGNOSIS — E785 Hyperlipidemia, unspecified: Secondary | ICD-10-CM | POA: Diagnosis present

## 2023-06-14 DIAGNOSIS — Z8673 Personal history of transient ischemic attack (TIA), and cerebral infarction without residual deficits: Secondary | ICD-10-CM | POA: Diagnosis not present

## 2023-06-14 DIAGNOSIS — I10 Essential (primary) hypertension: Secondary | ICD-10-CM | POA: Diagnosis present

## 2023-06-14 LAB — CBC WITH DIFFERENTIAL/PLATELET
Abs Immature Granulocytes: 0.03 10*3/uL (ref 0.00–0.07)
Basophils Absolute: 0 10*3/uL (ref 0.0–0.1)
Basophils Relative: 0 %
Eosinophils Absolute: 0.2 10*3/uL (ref 0.0–0.5)
Eosinophils Relative: 2 %
HCT: 38.3 % — ABNORMAL LOW (ref 39.0–52.0)
Hemoglobin: 11.6 g/dL — ABNORMAL LOW (ref 13.0–17.0)
Immature Granulocytes: 0 %
Lymphocytes Relative: 16 %
Lymphs Abs: 1.6 10*3/uL (ref 0.7–4.0)
MCH: 23.4 pg — ABNORMAL LOW (ref 26.0–34.0)
MCHC: 30.3 g/dL (ref 30.0–36.0)
MCV: 77.2 fL — ABNORMAL LOW (ref 80.0–100.0)
Monocytes Absolute: 0.9 10*3/uL (ref 0.1–1.0)
Monocytes Relative: 9 %
Neutro Abs: 7.2 10*3/uL (ref 1.7–7.7)
Neutrophils Relative %: 73 %
Platelets: 289 10*3/uL (ref 150–400)
RBC: 4.96 MIL/uL (ref 4.22–5.81)
RDW: 19.4 % — ABNORMAL HIGH (ref 11.5–15.5)
WBC: 10 10*3/uL (ref 4.0–10.5)
nRBC: 0 % (ref 0.0–0.2)

## 2023-06-14 LAB — COMPREHENSIVE METABOLIC PANEL
ALT: 17 U/L (ref 0–44)
AST: 17 U/L (ref 15–41)
Albumin: 3 g/dL — ABNORMAL LOW (ref 3.5–5.0)
Alkaline Phosphatase: 77 U/L (ref 38–126)
Anion gap: 11 (ref 5–15)
BUN: 21 mg/dL (ref 8–23)
CO2: 23 mmol/L (ref 22–32)
Calcium: 8.7 mg/dL — ABNORMAL LOW (ref 8.9–10.3)
Chloride: 101 mmol/L (ref 98–111)
Creatinine, Ser: 1.25 mg/dL — ABNORMAL HIGH (ref 0.61–1.24)
GFR, Estimated: 57 mL/min — ABNORMAL LOW (ref >60)
Glucose, Bld: 110 mg/dL — ABNORMAL HIGH (ref 70–99)
Potassium: 4.6 mmol/L (ref 3.5–5.1)
Sodium: 135 mmol/L (ref 135–145)
Total Bilirubin: 0.7 mg/dL (ref 0.0–1.2)
Total Protein: 6.9 g/dL (ref 6.5–8.1)

## 2023-06-14 MED ORDER — ACETAMINOPHEN 650 MG RE SUPP: 650.0000 mg | Freq: Four times a day (QID) | RECTAL | Status: DC | PRN

## 2023-06-14 MED ORDER — ONDANSETRON HCL 4 MG PO TABS: 4.0000 mg | ORAL_TABLET | Freq: Four times a day (QID) | ORAL | Status: DC | PRN

## 2023-06-14 MED ORDER — MAGNESIUM OXIDE -MG SUPPLEMENT 400 (240 MG) MG PO TABS
800.0000 mg | ORAL_TABLET | Freq: Two times a day (BID) | ORAL | Status: DC
  Administered 2023-06-14 – 2023-06-16 (×4): 800 mg via ORAL
  Filled 2023-06-14 (×3): qty 2

## 2023-06-14 MED ORDER — PSYLLIUM 95 % PO PACK
1.0000 | PACK | Freq: Two times a day (BID) | ORAL | Status: DC
  Administered 2023-06-14 – 2023-06-16 (×4): 1 via ORAL
  Filled 2023-06-14 (×4): qty 1

## 2023-06-14 MED ORDER — FINASTERIDE 5 MG PO TABS
5.0000 mg | ORAL_TABLET | Freq: Every day | ORAL | Status: DC
Start: 2023-06-15 — End: 2023-06-16
  Administered 2023-06-15 – 2023-06-16 (×2): 5 mg via ORAL
  Filled 2023-06-14 (×2): qty 1

## 2023-06-14 MED ORDER — ONDANSETRON HCL 4 MG/2ML IJ SOLN: 4.0000 mg | Freq: Four times a day (QID) | INTRAMUSCULAR | Status: DC | PRN

## 2023-06-14 MED ORDER — VITAMIN B-12 1000 MCG PO TABS
1000.0000 ug | ORAL_TABLET | Freq: Every day | ORAL | Status: DC
  Administered 2023-06-15 – 2023-06-16 (×2): 1000 ug via ORAL
  Filled 2023-06-14: qty 1
  Filled 2023-06-14: qty 2

## 2023-06-14 MED ORDER — SENNOSIDES-DOCUSATE SODIUM 8.6-50 MG PO TABS: 1.0000 | ORAL_TABLET | Freq: Two times a day (BID) | ORAL | Status: DC | PRN

## 2023-06-14 MED ORDER — SPIRONOLACTONE 25 MG PO TABS
25.0000 mg | ORAL_TABLET | Freq: Every day | ORAL | Status: DC
Start: 2023-06-15 — End: 2023-06-16
  Administered 2023-06-15 – 2023-06-16 (×2): 25 mg via ORAL
  Filled 2023-06-14 (×2): qty 1

## 2023-06-14 MED ORDER — MIRABEGRON ER 25 MG PO TB24
25.0000 mg | ORAL_TABLET | Freq: Every day | ORAL | Status: DC
Start: 2023-06-15 — End: 2023-06-16
  Administered 2023-06-15 – 2023-06-16 (×2): 25 mg via ORAL
  Filled 2023-06-14 (×2): qty 1

## 2023-06-14 MED ORDER — ACETAMINOPHEN 325 MG PO TABS: 650.0000 mg | ORAL_TABLET | Freq: Four times a day (QID) | ORAL | Status: DC | PRN

## 2023-06-14 MED ORDER — ENOXAPARIN SODIUM 40 MG/0.4ML IJ SOSY
40.0000 mg | PREFILLED_SYRINGE | INTRAMUSCULAR | Status: DC
  Administered 2023-06-14 – 2023-06-15 (×2): 40 mg via SUBCUTANEOUS
  Filled 2023-06-14: qty 0.4

## 2023-06-14 MED ORDER — SERTRALINE HCL 50 MG PO TABS
50.0000 mg | ORAL_TABLET | Freq: Every day | ORAL | Status: DC
Start: 2023-06-15 — End: 2023-06-16
  Administered 2023-06-15 – 2023-06-16 (×2): 50 mg via ORAL
  Filled 2023-06-14 (×2): qty 1

## 2023-06-14 MED ORDER — TRAZODONE HCL 50 MG PO TABS: 25.0000 mg | ORAL_TABLET | Freq: Every evening | ORAL | Status: DC | PRN

## 2023-06-14 MED ORDER — FE FUM-VIT C-VIT B12-FA 460-60-0.01-1 MG PO CAPS
1.0000 | ORAL_CAPSULE | Freq: Every day | ORAL | Status: DC
  Administered 2023-06-15 – 2023-06-16 (×2): 1 via ORAL
  Filled 2023-06-14 (×2): qty 1

## 2023-06-14 MED ORDER — ATORVASTATIN CALCIUM 20 MG PO TABS
40.0000 mg | ORAL_TABLET | Freq: Every day | ORAL | Status: DC
  Administered 2023-06-14 – 2023-06-16 (×3): 40 mg via ORAL
  Filled 2023-06-14 (×2): qty 2

## 2023-06-14 MED ORDER — PANTOPRAZOLE SODIUM 20 MG PO TBEC
20.0000 mg | DELAYED_RELEASE_TABLET | Freq: Every day | ORAL | Status: DC
  Filled 2023-06-14: qty 1

## 2023-06-14 MED ORDER — GABAPENTIN 300 MG PO CAPS
600.0000 mg | ORAL_CAPSULE | Freq: Two times a day (BID) | ORAL | Status: DC
  Administered 2023-06-15 – 2023-06-16 (×3): 600 mg via ORAL
  Filled 2023-06-14 (×3): qty 2

## 2023-06-14 MED ORDER — TAMSULOSIN HCL 0.4 MG PO CAPS
0.4000 mg | ORAL_CAPSULE | Freq: Every day | ORAL | Status: DC
  Administered 2023-06-15 – 2023-06-16 (×2): 0.4 mg via ORAL
  Filled 2023-06-14 (×2): qty 1

## 2023-06-14 MED ORDER — CLOPIDOGREL BISULFATE 75 MG PO TABS: 75.0000 mg | ORAL_TABLET | Freq: Every day | ORAL | Status: DC

## 2023-06-14 MED ORDER — POLYSACCHARIDE IRON COMPLEX 150 MG PO CAPS: 150.0000 mg | ORAL_CAPSULE | Freq: Every day | ORAL | Status: DC

## 2023-06-14 MED ORDER — MAGNESIUM HYDROXIDE 400 MG/5ML PO SUSP: 30.0000 mL | Freq: Every day | ORAL | Status: DC | PRN

## 2023-06-14 MED ORDER — METOPROLOL SUCCINATE ER 25 MG PO TB24
12.5000 mg | ORAL_TABLET | Freq: Every day | ORAL | Status: DC
  Administered 2023-06-15 – 2023-06-16 (×2): 12.5 mg via ORAL
  Filled 2023-06-14 (×2): qty 1

## 2023-06-14 MED ORDER — FESOTERODINE FUMARATE ER 4 MG PO TB24
4.0000 mg | ORAL_TABLET | Freq: Every day | ORAL | Status: DC
  Administered 2023-06-15 – 2023-06-16 (×2): 4 mg via ORAL
  Filled 2023-06-14 (×2): qty 1

## 2023-06-14 NOTE — Assessment & Plan Note (Signed)
-   We will continue antihypertensive therapy.

## 2023-06-14 NOTE — Assessment & Plan Note (Addendum)
-   We will continue Toprol-XL and Aldactone

## 2023-06-14 NOTE — ED Triage Notes (Signed)
 Suprpubic catheter blocked and pus from penis -- per NP note.

## 2023-06-14 NOTE — Assessment & Plan Note (Signed)
-   We will continue Zoloft 

## 2023-06-14 NOTE — Assessment & Plan Note (Addendum)
-   We will continue antiplatelet therapy as well as statin therapy.

## 2023-06-14 NOTE — ED Provider Triage Note (Addendum)
 Emergency Medicine Provider Triage Evaluation Note  Adam Rios. , a 83 y.o. male  was evaluated in triage.  Pt complains of possible clogged suprapubic catheter, tried to flush at the care facility and were unable to. Patients provider is concerned it may be stuck to the bladder wall. He reports lower abdominal pain. Facility has not noticed much urine production. Patient also reports yellow pus discharge from the penis.   Review of Systems  Positive: Lower abdominal pain Negative:   Physical Exam  There were no vitals taken for this visit. Gen:   Awake, no distress   Resp:  Normal effort  MSK:   Moves extremities without difficulty  Other:    Medical Decision Making  Medically screening exam initiated at 2:20 PM.  Appropriate orders placed.  Adam Rios. was informed that the remainder of the evaluation will be completed by another provider, this initial triage assessment does not replace that evaluation, and the importance of remaining in the ED until their evaluation is complete.     Adam Tinnie LABOR, PA-C 06/14/23 1422    Adam Tinnie LABOR, PA-C 06/14/23 1424

## 2023-06-14 NOTE — Assessment & Plan Note (Addendum)
 IR was consulted to replace suprapubic catheter, UA with concern of UTI -Urine culture ordered and starting on Rocephin - Will consider placing coud catheter if he has significant discomfort and increasing amount of retained urine.

## 2023-06-14 NOTE — Assessment & Plan Note (Signed)
 Continue statin therapy.

## 2023-06-14 NOTE — ED Provider Notes (Signed)
 [facility] Provider Note   Event Date/Time   First MD Initiated Contact with Patient 06/14/23 1707     (approximate) History  No chief complaint on file.  HPI Adam Zea. is a 83 y.o. male with a past medical history of chronic catheter use and recently suprapubic catheter placed 1 month ago at [facility] who presents complaining of his catheter being clogged.  Patient states that he has not had any urine output since yesterday with significant amount of sediment throughout the tubing.  Patient states that multiple nurses have tried to flush this line with no improvement and he was told to come to the emergency department to possibly have it replaced. ROS: Patient currently denies any vision changes, tinnitus, difficulty speaking, facial droop, sore throat, chest pain, shortness of breath, abdominal pain, nausea/vomiting/diarrhea, dysuria, or weakness/numbness/paresthesias in any extremity   Physical Exam  Triage Vital Signs: ED Triage Vitals  Encounter Vitals Group     BP 06/14/23 1422 112/74     Systolic BP Percentile --      Diastolic BP Percentile --      Pulse Rate 06/14/23 1422 80     Resp 06/14/23 1422 16     Temp 06/14/23 1422 98.1 F (36.7 C)     Temp src --      SpO2 06/14/23 1422 95 %     Weight 06/14/23 1422 199 lb 15.3 oz (90.7 kg)     Height 06/14/23 1726 6' (1.829 m)     Head Circumference --      Peak Flow --      Pain Score 06/14/23 1422 0     Pain Loc --      Pain Education --      Exclude from Growth Chart --    Most recent vital signs: Vitals:   06/14/23 1829 06/14/23 2215  BP: 110/70   Pulse: 78   Resp: 16   Temp: 98 F (36.7 C) 98 F (36.7 C)  SpO2: 96%    General: Awake, oriented x4. CV:  Good peripheral perfusion.  Resp:  Normal effort.  Abd:  No distention.  Suprapubic catheter in place Other:   Resting comfortably in no acute distress ED Results / Procedures / Treatments  Labs (all labs ordered are listed, but  only abnormal results are displayed) Labs Reviewed  COMPREHENSIVE METABOLIC PANEL - Abnormal; Notable for the following components:      Result Value   Glucose, Bld 110 (*)    Creatinine, Ser 1.25 (*)    Calcium  8.7 (*)    Albumin 3.0 (*)    GFR, Estimated 57 (*)    All other components within normal limits  CBC WITH DIFFERENTIAL/PLATELET - Abnormal; Notable for the following components:   Hemoglobin 11.6 (*)    HCT 38.3 (*)    MCV 77.2 (*)    MCH 23.4 (*)    RDW 19.4 (*)    All other components within normal limits  URINALYSIS, ROUTINE W REFLEX MICROSCOPIC  BASIC METABOLIC PANEL  CBC   PROCEDURES: Critical Care performed: No Procedures MEDICATIONS ORDERED IN ED: Medications  enoxaparin  (LOVENOX ) injection 40 mg (has no administration in time range)  acetaminophen  (TYLENOL ) tablet 650 mg (has no administration in time range)    Or  acetaminophen  (TYLENOL ) suppository 650 mg (has no administration in time range)  traZODone  (DESYREL ) tablet 25 mg (has no administration in time range)  magnesium  hydroxide (MILK OF MAGNESIA) suspension 30 mL (  has no administration in time range)  ondansetron  (ZOFRAN ) tablet 4 mg (has no administration in time range)    Or  ondansetron  (ZOFRAN ) injection 4 mg (has no administration in time range)  atorvastatin  (LIPITOR) tablet 40 mg (has no administration in time range)  metoprolol  succinate (TOPROL -XL) 24 hr tablet 12.5 mg (has no administration in time range)  spironolactone  (ALDACTONE ) tablet 25 mg (has no administration in time range)  sertraline  (ZOLOFT ) tablet 50 mg (has no administration in time range)  pantoprazole  (PROTONIX ) EC tablet 20 mg (has no administration in time range)  psyllium (HYDROCIL/METAMUCIL) 1 packet (has no administration in time range)  senna-docusate (Senokot-S) tablet 1 tablet (has no administration in time range)  mirabegron  ER (MYRBETRIQ ) tablet 25 mg (has no administration in time range)  finasteride  (PROSCAR )  tablet 5 mg (has no administration in time range)  fesoterodine  (TOVIAZ ) tablet 4 mg (has no administration in time range)  tamsulosin  (FLOMAX ) capsule 0.4 mg (has no administration in time range)  Fe Fum-Vit C-Vit B12-FA (TRIGELS-F FORTE) capsule 1 capsule (has no administration in time range)  cyanocobalamin  (VITAMIN B12) tablet 1,000 mcg (has no administration in time range)  gabapentin  (NEURONTIN ) capsule 600 mg (has no administration in time range)  magnesium  oxide (MAG-OX) tablet 800 mg (has no administration in time range)   IMPRESSION / MDM / ASSESSMENT AND PLAN / ED COURSE  I reviewed the triage vital signs and the nursing notes.                             The patient is on the cardiac monitor to evaluate for evidence of arrhythmia and/or significant heart rate changes. Patient's presentation is most consistent with acute presentation with potential threat to life or bodily function. 82 year old male with one day of acute urinary retention in the setting of suprapubic catheter. Well appearing/nonseptic. Tolerating PO.  ED Workup: UA ED Interventions: Catheter replacement  Patient exam and history not consistent with cauda equina, infectious etiology, constipation based retention/intraabdominal mass/AAA, trauma, nephro/urolithiasis, drug reaction, cancer.  I spoke to Dr. Karalee in interventional radiology who agrees to replace his catheter tomorrow.  I also spoke with Dr. Lawence and internal medicine agrees to set this patient onto the internal medicine service for further management until replacement tomorrow Dispo: Admit to medicine   FINAL CLINICAL IMPRESSION(S) / ED DIAGNOSES   Final diagnoses:  Suprapubic catheter dysfunction, initial encounter [facility])   Rx / DC Orders   ED Discharge Orders     None      Note:  This document was prepared using Dragon voice recognition software and may include unintentional dictation errors.   Citlalic Norlander K, MD 06/14/23  [phone]

## 2023-06-14 NOTE — Assessment & Plan Note (Signed)
 Continue PPI therapy.

## 2023-06-14 NOTE — Assessment & Plan Note (Addendum)
-   We will continue Flomax and Proscar. 

## 2023-06-14 NOTE — ED Notes (Addendum)
 See triage note  Presents with suprapubic  cath  States the cath is not draining   States this was just changed 2 weeks ago

## 2023-06-14 NOTE — H&P (Signed)
 [place]   PATIENT NAME: Adam Rios    MR#:  [id number]  DATE OF BIRTH:  May 13, 1941  DATE OF ADMISSION:  06/14/2023  PRIMARY CARE PHYSICIAN: Wray Raring, MD   Patient is coming from: Home  REQUESTING/REFERRING PHYSICIAN: Jossie Birmingham, MD  CHIEF COMPLAINT:  Urinary retention  HISTORY OF PRESENT ILLNESS:  Rick Carruthers. is a 83 y.o. Caucasian male with medical history significant for CVA, hypertension, neurogenic bladder status post suprapubic catheter placement and cognitive impairment, who presented to the emergency room with acute onset of urinary retention.  The patient stated that he can urinate occasionally but is suprapubic catheter that is what he depends on and it has not been draining.  He denies any fever or chills.  No nausea or vomiting or abdominal pain.  No chest pain or palpitations.  No cough or wheezing or dyspnea.  No bleeding diathesis.  ED Course: When he came to the ER, vital signs were within normal.  Labs revealed a creatinine of 1.25 compared to 1.07 on 12/28/2022 with a BUN of 21 and albumin 3 with otherwise unremarkable CMP.  CBC showed anemia with hemoglobin 11.6 and hematocrit 38.3 comparable to previous levels.  UA is currently pending. EKG as reviewed by me : None Imaging: None.  Contact was made with IR who recommended observation for replacement of his catheter in AM.  Dr. Penne was also notified she recommended placing coud catheter if the patient is fairly uncomfortable during the night before replacement of his SP catheter.  The patient will be mated to a medical observation bed for further evaluation and management. PAST MEDICAL HISTORY:   Past Medical History:  Diagnosis Date  . Cognitive impairment   . Complication of anesthesia    Fentanyl  causes nausea  . Hemorrhagic cerebrovascular accident (CVA) (HCC)   . Hypertension   . Neurogenic bladder   . Renal cell carcinoma (HCC) 2005  . Renal disorder   . Stroke (HCC)      01/17/18, 12/21   Some weakness and cognitive decline    PAST SURGICAL HISTORY:   Past Surgical History:  Procedure Laterality Date  . APPENDECTOMY    . CATARACT EXTRACTION W/PHACO Right 07/22/2020   Procedure: CATARACT EXTRACTION PHACO AND INTRAOCULAR LENS PLACEMENT (IOC) RIGHT;  Surgeon: Myrna Adine Anes, MD;  Location: [facility];  Service: Ophthalmology;  Laterality: Right;  4.77 0:44.2  . CATARACT EXTRACTION W/PHACO Left 08/05/2020   Procedure: CATARACT EXTRACTION PHACO AND INTRAOCULAR LENS PLACEMENT (IOC) LEFT 10.73 01:13.8;  Surgeon: Myrna Adine Anes, MD;  Location: [facility];  Service: Ophthalmology;  Laterality: Left;  Use eye stretcher not chair  . HERNIA REPAIR    . HIP ARTHROPLASTY Left 11/28/2022   Procedure: ARTHROPLASTY BIPOLAR HIP (HEMIARTHROPLASTY);  Surgeon: Tobie Priest, MD;  Location: [facility] ORS;  Service: Orthopedics;  Laterality: Left;  . LUMBAR FUSION    . NEPHRECTOMY Left 2005  . TEMPORARY PACEMAKER N/A 05/03/2022   Procedure: TEMPORARY PACEMAKER;  Surgeon: Florencio Cara BIRCH, MD;  Location: [facility] IN[facility]AS[facility];  Service: Cardiovascular;  Laterality: N/A;    SOCIAL HISTORY:   Social History   Tobacco Use  . Smoking status: Former    Current packs/day: 0.00    Average packs/day: 0.3 packs/day for 10.0 years (2.5 ttl pk-yrs)    Types: Cigarettes    Start date: 10    Quit date: 2005    Years since quitting: 20.0  . Smokeless tobacco: Never  Substance Use Topics  . Alcohol  use: Not Currently    FAMILY HISTORY:   Family History  Problem Relation Age of Onset  . Cerebral aneurysm Father   . Cerebral aneurysm Sister     DRUG ALLERGIES:   Allergies  Allergen Reactions  . Empagliflozin     Recurrent UTI with sepsis on jardiance.  . Fentanyl  Nausea Only  . Oxycodone  Nausea And Vomiting    REVIEW OF SYSTEMS:   ROS As per history of present illness. All pertinent systems were reviewed above. Constitutional, HEENT,  cardiovascular, respiratory, GI, GU, musculoskeletal, neuro, psychiatric, endocrine, integumentary and hematologic systems were reviewed and are otherwise negative/unremarkable except for positive findings mentioned above in the HPI.   MEDICATIONS AT HOME:   Prior to Admission medications   Medication Sig Start Date End Date Taking? Authorizing Provider  Acetaminophen  Extra Strength 500 MG TABS Take 1,000 mg by mouth 3 (three) times daily as needed (PAIN).   Yes [provider]  atorvastatin  (LIPITOR) 40 MG tablet Take 40 mg by mouth daily.   Yes [provider]  CALPROTECT 0.44-20.6 % OINT Apply 1 Application topically 2 (two) times daily. 06/03/23  Yes [provider]  docusate sodium  (COLACE) 100 MG capsule Take 1 capsule (100 mg total) by mouth 2 (two) times daily. 12/04/22  Yes Maree Hue, MD  finasteride  (PROSCAR ) 5 MG tablet Take 5 mg by mouth daily. 04/27/22  Yes [provider]  gabapentin  (NEURONTIN ) 300 MG capsule Take 1 capsule (300 mg total) by mouth at bedtime. Patient taking differently: Take 600 mg by mouth 2 (two) times daily. 06/14/22  Yes Vasireddy, Padmaja, MD  hydrocortisone  cream ([employer] CORTISONE MAXIMUM STRENGTH) 1 % Apply 1 Application topically as needed for itching.   Yes [provider]  magnesium  oxide (MAG-OX) 400 (240 Mg) MG tablet Take 2 tablets by mouth 2 (two) times daily. 06/02/23  Yes [provider]  metoCLOPramide  (REGLAN ) 5 MG tablet Take 1-2 tablets (5-10 mg total) by mouth every 8 (eight) hours as needed for nausea (if ondansetron  (ZOFRAN ) ineffective.). 12/04/22  Yes Maree Hue, MD  metoprolol  succinate (TOPROL -XL) 25 MG 24 hr tablet Take 1 tablet (25 mg total) by mouth daily. Patient taking differently: Take 12.5 mg by mouth daily. 12/04/22  Yes Maree Hue, MD  MYRBETRIQ  25 MG TB24 tablet Take 25 mg by mouth daily. 06/02/23  Yes [provider]  ondansetron  (ZOFRAN ) 4 MG tablet Take 1 tablet (4 mg total)  by mouth every 6 (six) hours as needed for nausea. 12/04/22  Yes Maree Hue, MD  pantoprazole  (PROTONIX ) 20 MG tablet Take 1 tablet (20 mg total) by mouth daily. 10/14/22 06/14/23 Yes Von Bellis, MD  POLY-IRON  150 FORTE 150-25-1 MG-MCG-MG CAPS Take 1 capsule by mouth daily. 06/02/23  Yes [provider]  Psyllium Fiber 0.52 g CAPS Take 1 capsule by mouth 2 (two) times daily. 06/10/23  Yes [provider]  senna-docusate (SENOKOT-S) 8.6-50 MG tablet Take 1 tablet by mouth 2 (two) times daily as needed. 08/15/22  Yes Wieting, Richard, MD  sertraline  (ZOLOFT ) 50 MG tablet Take 50 mg by mouth daily. 06/02/23  Yes [provider]  solifenacin (VESICARE) 5 MG tablet Take 5 mg by mouth daily.   Yes [provider]  spironolactone  (ALDACTONE ) 25 MG tablet Take 1 tablet by mouth daily. 11/05/22 11/05/23 Yes [provider]  tamsulosin  (FLOMAX ) 0.4 MG CAPS capsule Take 1 capsule by mouth daily. 10/30/22  Yes [provider]  torsemide  40 MG TABS Take 40 mg by mouth daily. 12/05/22  Yes Maree Hue, MD  traMADol  (ULTRAM ) 50 MG tablet Take 1 tablet (50 mg total) by mouth every 6 (six) hours as needed for moderate pain. 11/29/22  Yes Verlinda Boas, PA-C  vitamin B-12 (CYANOCOBALAMIN ) 1000 MCG tablet Take 1,000 mcg by mouth daily.   Yes [provider]  Vitamin D , Ergocalciferol , (DRISDOL ) 1.25 MG (50000 UNIT) CAPS capsule Take 50,000 Units by mouth every 7 (seven) days. 06/02/23  Yes [provider]  aspirin  81 MG EC tablet Take 81 mg by mouth daily as needed. Patient not taking: Reported on 06/14/2023    [provider]  clopidogrel  (PLAVIX ) 75 MG tablet Take 75 mg by mouth daily. Patient not taking: Reported on 06/14/2023 09/08/22   [provider]  donepezil  (ARICEPT ) 10 MG tablet Take 1 tablet (10 mg total) by mouth at bedtime. Patient not taking: Reported on 06/14/2023 12/04/22   Maree Hue, MD  iron  polysaccharides (NIFEREX) 150 MG capsule  Take 1 capsule (150 mg total) by mouth daily. 10/14/22 01/12/23  Von Bellis, MD  oxyCODONE  (OXY IR/ROXICODONE ) 5 MG immediate release tablet Take 0.5-1 tablets (2.5-5 mg total) by mouth every 4 (four) hours as needed for moderate pain (pain score 4-6). Patient not taking: Reported on 06/14/2023 11/29/22   Verlinda Boas, PA-C      VITAL SIGNS:  Blood pressure 110/70, pulse 78, temperature 98 F (36.7 C), temperature source Oral, resp. rate 16, height 6' (1.829 m), weight 90.7 kg, SpO2 96%.  PHYSICAL EXAMINATION:  Physical Exam  GENERAL:  83 y.o.-year-old patient lying in the bed with no acute distress.  EYES: Pupils equal, round, reactive to light and accommodation. No scleral icterus. Extraocular muscles intact.  HEENT: Head atraumatic, normocephalic. Oropharynx and nasopharynx clear.  NECK:  Supple, no jugular venous distention. No thyroid  enlargement, no tenderness.  LUNGS: Normal breath sounds bilaterally, no wheezing, rales,rhonchi or crepitation. No use of accessory muscles of respiration.  CARDIOVASCULAR: Regular rate and rhythm, S1, S2 normal. No murmurs, rubs, or gallops.  ABDOMEN: Soft, nondistended, nontender. Bowel sounds present. No organomegaly or mass.  Suprapubic catheter in place without drainage.  No erythema or purulence around it. EXTREMITIES: No pedal edema, cyanosis, or clubbing.  NEUROLOGIC: Cranial nerves II through XII are intact. Muscle strength 5/5 in all extremities. Sensation intact. Gait not checked.  PSYCHIATRIC: The patient is alert and oriented x 3.  Normal affect and good eye contact. SKIN: No obvious rash, lesion, or ulcer.   LABORATORY PANEL:   CBC Recent Labs  Lab 06/14/23 1425  WBC 10.0  HGB 11.6*  HCT 38.3*  PLT 289   ------------------------------------------------------------------------------------------------------------------  Chemistries  Recent Labs  Lab 06/14/23 1425  NA 135  K 4.6  CL 101  CO2 23  GLUCOSE 110*  BUN 21   CREATININE 1.25*  CALCIUM  8.7*  AST 17  ALT 17  ALKPHOS 77  BILITOT 0.7   ------------------------------------------------------------------------------------------------------------------  Cardiac Enzymes No results for input(s): TROPONINI in the last 168 hours. ------------------------------------------------------------------------------------------------------------------  RADIOLOGY:  No results found.    IMPRESSION AND PLAN:  Assessment and Plan: * Suprapubic catheter dysfunction, initial encounter [facility]) - The patient will be admitted to an observation medical bed. - IR consult to be obtained for management/replacement of a suprapubic catheter. - We will monitor urine output. - His bladder scan is currently showing 424 mL. - Will consider placing coud catheter if he has significant discomfort and increasing amount of retained  urine. - Will await urinalysis.  He does not seem to have symptoms of UTI.  Chronic diastolic CHF (congestive heart failure) (HCC) - We will continue Toprol -XL and hold off Aldactone  and torsemide  for the night.  BPH with obstruction/lower urinary tract symptoms - We will continue Flomax  and Proscar . - Will hold off diuretic therapy for now.  Dyslipidemia - Continue statin therapy.  Essential hypertension - We will continue antihypertensive therapy.  History of CVA (cerebrovascular accident) - We will continue antiplatelet therapy as well as statin therapy.  Depression - We will continue Zoloft .  GERD without esophagitis - Continue PPI therapy.       DVT prophylaxis: Lovenox . Advanced Care Planning:  Code Status: full code. Family Communication:  The plan of care was discussed in details with the patient (and family). I answered all questions. The patient agreed to proceed with the above mentioned plan. Further management will depend upon hospital course. Disposition Plan: Back to previous home environment Consults called:  IR. All the records are reviewed and case discussed with ED provider.  Status is: Observation  I certify that at the time of admission, it is my clinical judgment that the patient will require  hospital care extending less than 2 midnights.                            Dispo: The patient is from: Home              Anticipated d/c is to: Home              Patient currently is not medically stable to d/c.              Difficult to place patient: No  Madison DELENA Peaches M.D on 06/14/2023 at 10:42 PM  [facility]   From 7 PM-7 AM, contact night-coverage [email or web address]  CC: Primary care physician; Wray Raring, MD

## 2023-06-15 ENCOUNTER — Observation Stay: Payer: Medicare (Managed Care) | Admitting: Radiology

## 2023-06-15 DIAGNOSIS — I5032 Chronic diastolic (congestive) heart failure: Secondary | ICD-10-CM | POA: Diagnosis not present

## 2023-06-15 DIAGNOSIS — F32A Depression, unspecified: Secondary | ICD-10-CM

## 2023-06-15 DIAGNOSIS — I10 Essential (primary) hypertension: Secondary | ICD-10-CM | POA: Diagnosis not present

## 2023-06-15 DIAGNOSIS — Z8673 Personal history of transient ischemic attack (TIA), and cerebral infarction without residual deficits: Secondary | ICD-10-CM

## 2023-06-15 DIAGNOSIS — N401 Enlarged prostate with lower urinary tract symptoms: Secondary | ICD-10-CM | POA: Diagnosis not present

## 2023-06-15 DIAGNOSIS — T83010A Breakdown (mechanical) of cystostomy catheter, initial encounter: Secondary | ICD-10-CM | POA: Diagnosis not present

## 2023-06-15 HISTORY — PX: IR CATHETER TUBE CHANGE: IMG717

## 2023-06-15 LAB — URINALYSIS, ROUTINE W REFLEX MICROSCOPIC
Bilirubin Urine: NEGATIVE
Glucose, UA: NEGATIVE mg/dL
Ketones, ur: NEGATIVE mg/dL
Nitrite: NEGATIVE
Protein, ur: 100 mg/dL — AB
Specific Gravity, Urine: 1.012 (ref 1.005–1.030)
pH: 9 — ABNORMAL HIGH (ref 5.0–8.0)

## 2023-06-15 LAB — BASIC METABOLIC PANEL
Anion gap: 8 (ref 5–15)
BUN: 21 mg/dL (ref 8–23)
CO2: 23 mmol/L (ref 22–32)
Calcium: 8.5 mg/dL — ABNORMAL LOW (ref 8.9–10.3)
Chloride: 104 mmol/L (ref 98–111)
Creatinine, Ser: 1 mg/dL (ref 0.61–1.24)
GFR, Estimated: >60 mL/min (ref >60)
Glucose, Bld: 91 mg/dL (ref 70–99)
Potassium: 4 mmol/L (ref 3.5–5.1)
Sodium: 135 mmol/L (ref 135–145)

## 2023-06-15 LAB — OCCULT BLOOD X 1 CARD TO LAB, STOOL: Fecal Occult Bld: NEGATIVE

## 2023-06-15 LAB — CBC
HCT: 33.8 % — ABNORMAL LOW (ref 39.0–52.0)
Hemoglobin: 10.5 g/dL — ABNORMAL LOW (ref 13.0–17.0)
MCH: 23.5 pg — ABNORMAL LOW (ref 26.0–34.0)
MCHC: 31.1 g/dL (ref 30.0–36.0)
MCV: 75.8 fL — ABNORMAL LOW (ref 80.0–100.0)
Platelets: 243 10*3/uL (ref 150–400)
RBC: 4.46 MIL/uL (ref 4.22–5.81)
RDW: 19.1 % — ABNORMAL HIGH (ref 11.5–15.5)
WBC: 7.6 10*3/uL (ref 4.0–10.5)
nRBC: 0 % (ref 0.0–0.2)

## 2023-06-15 MED ORDER — MIDAZOLAM HCL 2 MG/2ML IJ SOLN
INTRAMUSCULAR | Status: AC
  Filled 2023-06-15: qty 2

## 2023-06-15 MED ORDER — IOHEXOL 300 MG/ML  SOLN
8.0000 mL | Freq: Once | INTRAMUSCULAR | Status: AC | PRN
  Administered 2023-06-15: 8 mL

## 2023-06-15 MED ORDER — LIDOCAINE HCL 1 % IJ SOLN
INTRAMUSCULAR | Status: AC
  Filled 2023-06-15: qty 20

## 2023-06-15 MED ORDER — MIDAZOLAM HCL 2 MG/2ML IJ SOLN
INTRAMUSCULAR | Status: AC | PRN
  Administered 2023-06-15: .5 mg via INTRAVENOUS

## 2023-06-15 MED ORDER — PANTOPRAZOLE SODIUM 40 MG IV SOLR
40.0000 mg | Freq: Two times a day (BID) | INTRAVENOUS | Status: DC
  Administered 2023-06-15: 40 mg via INTRAVENOUS
  Filled 2023-06-15: qty 10

## 2023-06-15 MED ORDER — PANTOPRAZOLE SODIUM 40 MG PO TBEC
40.0000 mg | DELAYED_RELEASE_TABLET | Freq: Every day | ORAL | Status: DC
  Administered 2023-06-16: 40 mg via ORAL
  Filled 2023-06-15 (×2): qty 1

## 2023-06-15 MED ORDER — SODIUM CHLORIDE 0.9 % IV SOLN
2.0000 g | INTRAVENOUS | Status: DC
  Administered 2023-06-15: 2 g via INTRAVENOUS
  Filled 2023-06-15: qty 20

## 2023-06-15 NOTE — ED Notes (Signed)
 Pt in IR. Called to confirm.

## 2023-06-15 NOTE — Consult Note (Signed)
 Chief Complaint: Malfunctioning suprapubic. Request is for suprapubic catheter exchange - 16 Fr.   Referring Physician(s): Dr. Amaryllis Dare  Supervising Physician: Alona Corners  Patient Status: [facility] - ED  History of Present Illness: Adam Klinger. is a 82 y.o. male outpatient (in the ED).  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 Per Care everywhere the suprapubic catheter was pulled out and [facility] replaced the tube on 12.24.24. Patient presented to the ED at [facility] on 1.13.24 with a malfunctioning / clogged suprapubic catheter. Team is requesting a suprapubic catheter exchange with moderate sedation. .  Currently without any significant complaints. Patient alert and laying in bed,calm. Sleepy. Oriented to person  only. Not oriented to time or place.   All labs are within acceptable parameters. ASA and Plavix  are listed as home medications. Allergies include oxycodone  and fentanyl . Patient has been NPO since midnight.   Return precautions and treatment recommendations and follow-up discussed with the patient 's daughter [name] who is agreeable with the plan.   Past Medical History:  Diagnosis Date   Cognitive impairment    Complication of anesthesia    Fentanyl  causes nausea   Hemorrhagic cerebrovascular accident (CVA) (HCC)    Hypertension    Neurogenic bladder    Renal cell carcinoma (HCC) 2005   Renal disorder    Stroke (HCC)     01/17/18, 12/21   Some weakness and cognitive decline    Past Surgical History:  Procedure Laterality Date   APPENDECTOMY     CATARACT EXTRACTION W/PHACO Right 07/22/2020   Procedure: CATARACT EXTRACTION PHACO AND INTRAOCULAR LENS PLACEMENT (IOC) RIGHT;  Surgeon: Myrna Adine Anes, MD;  Location: [facility];  Service: Ophthalmology;  Laterality: Right;  4.77 0:44.2   CATARACT EXTRACTION W/PHACO Left 08/05/2020    Procedure: CATARACT EXTRACTION PHACO AND INTRAOCULAR LENS PLACEMENT (IOC) LEFT 10.73 01:13.8;  Surgeon: Myrna Adine Anes, MD;  Location: [facility];  Service: Ophthalmology;  Laterality: Left;  Use eye stretcher not chair   HERNIA REPAIR     HIP ARTHROPLASTY Left 11/28/2022   Procedure: ARTHROPLASTY BIPOLAR HIP (HEMIARTHROPLASTY);  Surgeon: Tobie Priest, MD;  Location: [facility] OR[facility];  Service: Orthopedics;  Laterality: Left;   LUMBAR FUSION     NEPHRECTOMY Left 2005   TEMPORARY PACEMAKER N/A 05/03/2022   Procedure: TEMPORARY PACEMAKER;  Surgeon: Florencio Cara BIRCH, MD;  Location: [facility]AS[facility];  Service: Cardiovascular;  Laterality: N/A;    Allergies: Empagliflozin, Fentanyl , and Oxycodone   Medications: Prior to Admission medications   Medication Sig Start Date End Date Taking? Authorizing Provider  Acetaminophen  Extra Strength 500 MG TABS Take 1,000 mg by mouth 3 (three) times daily as needed (PAIN).   Yes [provider]  atorvastatin  (LIPITOR) 40 MG tablet Take 40 mg by mouth daily.   Yes [provider]  CALPROTECT 0.44-20.6 % OINT Apply 1 Application topically 2 (two) times daily. 06/03/23  Yes [provider]  docusate sodium  (COLACE) 100 MG capsule Take 1 capsule (100 mg total) by mouth 2 (two) times daily. 12/04/22  Yes Maree Hue, MD  finasteride  (PROSCAR ) 5 MG tablet Take 5 mg by mouth daily. 04/27/22  Yes [provider]  gabapentin  (NEURONTIN ) 300 MG capsule Take 1 capsule (300 mg total) by mouth at bedtime. Patient taking differently: Take 600 mg by mouth 2 (two) times daily. 06/14/22  Yes Vasireddy,  Padmaja, MD  hydrocortisone  cream ([employer] CORTISONE MAXIMUM STRENGTH) 1 % Apply 1 Application topically as needed for itching.   Yes [provider]  magnesium  oxide (MAG-OX) 400 (240 Mg) MG tablet Take 2 tablets by mouth 2 (two) times daily. 06/02/23  Yes [provider]  metoCLOPramide  (REGLAN ) 5 MG tablet Take 1-2 tablets  (5-10 mg total) by mouth every 8 (eight) hours as needed for nausea (if ondansetron  (ZOFRAN ) ineffective.). 12/04/22  Yes Maree Hue, MD  metoprolol  succinate (TOPROL -XL) 25 MG 24 hr tablet Take 1 tablet (25 mg total) by mouth daily. Patient taking differently: Take 12.5 mg by mouth daily. 12/04/22  Yes Maree Hue, MD  MYRBETRIQ  25 MG TB24 tablet Take 25 mg by mouth daily. 06/02/23  Yes [provider]  ondansetron  (ZOFRAN ) 4 MG tablet Take 1 tablet (4 mg total) by mouth every 6 (six) hours as needed for nausea. 12/04/22  Yes Maree Hue, MD  pantoprazole  (PROTONIX ) 20 MG tablet Take 1 tablet (20 mg total) by mouth daily. 10/14/22 06/14/23 Yes Von Bellis, MD  POLY-IRON  150 FORTE 150-25-1 MG-MCG-MG CAPS Take 1 capsule by mouth daily. 06/02/23  Yes [provider]  Psyllium Fiber 0.52 g CAPS Take 1 capsule by mouth 2 (two) times daily. 06/10/23  Yes [provider]  senna-docusate (SENOKOT-S) 8.6-50 MG tablet Take 1 tablet by mouth 2 (two) times daily as needed. 08/15/22  Yes Wieting, Richard, MD  sertraline  (ZOLOFT ) 50 MG tablet Take 50 mg by mouth daily. 06/02/23  Yes [provider]  solifenacin (VESICARE) 5 MG tablet Take 5 mg by mouth daily.   Yes [provider]  spironolactone  (ALDACTONE ) 25 MG tablet Take 1 tablet by mouth daily. 11/05/22 11/05/23 Yes [provider]  tamsulosin  (FLOMAX ) 0.4 MG CAPS capsule Take 1 capsule by mouth daily. 10/30/22  Yes [provider]  torsemide  40 MG TABS Take 40 mg by mouth daily. 12/05/22  Yes Maree Hue, MD  traMADol  (ULTRAM ) 50 MG tablet Take 1 tablet (50 mg total) by mouth every 6 (six) hours as needed for moderate pain. 11/29/22  Yes Verlinda Boas, PA-C  vitamin B-12 (CYANOCOBALAMIN ) 1000 MCG tablet Take 1,000 mcg by mouth daily.   Yes [provider]  Vitamin D , Ergocalciferol , (DRISDOL ) 1.25 MG (50000 UNIT) CAPS capsule Take 50,000 Units by mouth every 7 (seven) days. 06/02/23  Yes [provider]  aspirin  81 MG EC tablet Take 81 mg by mouth daily as needed. Patient not taking: Reported on 06/14/2023    [provider]  clopidogrel  (PLAVIX ) 75 MG tablet Take 75 mg by mouth daily. Patient not taking: Reported on 06/14/2023 09/08/22   [provider]  donepezil  (ARICEPT ) 10 MG tablet Take 1 tablet (10 mg total) by mouth at bedtime. Patient not taking: Reported on 06/14/2023 12/04/22   Maree Hue, MD  iron  polysaccharides (NIFEREX) 150 MG capsule Take 1 capsule (150 mg total) by mouth daily. 10/14/22 01/12/23  Von Bellis, MD  oxyCODONE  (OXY IR/ROXICODONE ) 5 MG immediate release tablet Take 0.5-1 tablets (2.5-5 mg total) by mouth every 4 (four) hours as needed for moderate pain (pain score 4-6). Patient not taking: Reported on 06/14/2023 11/29/22   Verlinda Boas, PA-C     Family History  Problem Relation Age of Onset   Cerebral aneurysm Father    Cerebral aneurysm Sister     Social History   Socioeconomic History   Marital status: Divorced    Spouse name: Not on file   Number  of children: Not on file   Years of education: Not on file   Highest education level: Not on file  Occupational History   Not on file  Tobacco Use   Smoking status: Former    Current packs/day: 0.00    Average packs/day: 0.3 packs/day for 10.0 years (2.5 ttl pk-yrs)    Types: Cigarettes    Start date: 66    Quit date: 2005    Years since quitting: 20.0   Smokeless tobacco: Never  Vaping Use   Vaping status: Never Used  Substance and Sexual Activity   Alcohol  use: Not Currently   Drug use: Never   Sexual activity: Not Currently  Other Topics Concern   Not on file  Social History Narrative   Not on file   Social Drivers of Health   Financial Resource Strain: Low Risk  (05/04/2022)   Received from [facility], [facility]   Overall Financial Resource Strain (CARDIA)    Difficulty of Paying Living Expenses: Not very hard  Food Insecurity: No Food Insecurity (11/28/2022)    Hunger Vital Sign    Worried About Running Out of Food in the Last Year: Never true    Ran Out of Food in the Last Year: Never true  Transportation Needs: No Transportation Needs (11/28/2022)   PRAPARE - Administrator, Civil Service (Medical): No    Lack of Transportation (Non-Medical): No  Physical Activity: Inactive (03/29/2018)   Received from [facility]   Exercise Vital Sign    Days of Exercise per Week: 0 days    Minutes of Exercise per Session: 0 min  Stress: No Stress Concern Present (03/29/2018)   Received from [facility], [facility] - Occupational Stress Questionnaire    Feeling of Stress : Not at all  Social Connections: Moderately Isolated (03/29/2018)   Received from [facility], [facility]   Social Connection and Isolation Panel [NHANES]    Frequency of Communication with Friends and Family: More than three times a week    Frequency of Social Gatherings with Friends and Family: More than three times a week    Attends Religious Services: 1 to 4 times per year    Active Member of [employer] or Organizations: No    Attends Banker Meetings: Never    Marital Status: Divorced    Review of Systems: A 12 point ROS discussed and pertinent positives are indicated in the HPI above.  All other systems are negative.  Review of Systems  Unable to perform ROS: Mental status change    Vital Signs: BP (!) 140/84   Pulse (!) 59   Temp 98.1 F (36.7 C) (Oral)   Resp 20   Ht 6' (1.829 m)   Wt 199 lb 15.3 oz (90.7 kg)   SpO2 95%   BMI 27.12 kg/m   Advance Care Plan: The advanced care plan/surrogate decision maker was discussed at the time of visit and the patient did not wish to discuss or was not able to name a surrogate decision maker or provide an advance care plan.    Physical Exam Vitals and nursing note reviewed.  Constitutional:      Appearance: He is  well-developed.  HENT:     Head: Normocephalic.     Mouth/Throat:     Mouth: Mucous membranes are dry.  Cardiovascular:     Rate and Rhythm:  Regular rhythm. Bradycardia present.  Pulmonary:     Effort: Pulmonary effort is normal.  Genitourinary:    Comments: 16 Fr [place] Suprapubic catheter present no retention suture present Musculoskeletal:        General: Normal range of motion.     Cervical back: Normal range of motion.  Skin:    General: Skin is warm and dry.  Neurological:     Mental Status: He is alert. He is disoriented.     Imaging: No results found.  Labs:  CBC: Recent Labs    12/01/22 0535 12/28/22 0954 06/14/23 1425 06/15/23 0550  WBC 9.4 7.5 10.0 7.6  HGB 9.0* 11.4* 11.6* 10.5*  HCT 28.1* 37.3* 38.3* 33.8*  PLT 171 247 289 243    COAGS: Recent Labs    11/27/22 1632  INR 1.1  APTT 33    BMP: Recent Labs    12/01/22 0535 12/28/22 0954 06/14/23 1425 06/15/23 0550  NA 134* 136 135 135  K 3.7 3.4* 4.6 4.0  CL 99 102 101 104  CO2 26 22 23 23   GLUCOSE 96 133* 110* 91  BUN 29* 13 21 21   CALCIUM  8.1* 8.9 8.7* 8.5*  CREATININE 0.95 1.07 1.25* 1.00  GFRNONAA >60 >60 57* >60    LIVER FUNCTION TESTS: Recent Labs    11/01/22 1143 11/27/22 1304 12/28/22 0954 06/14/23 1425  BILITOT 1.1 0.6 0.7 0.7  AST 42* 20 19 17   ALT 22 18 9 17   ALKPHOS 72 61 87 77  PROT 6.5 6.3* 7.4 6.9  ALBUMIN 3.0* 3.2* 3.6 3.0*    Assessment and Plan:  83 y.o. male outpatient (in the ED).  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 Per Care everywhere the suprapubic catheter was pulled out and [facility] replaced the tube on 12.24.24. Patient presented to the ED at [facility] on 1.13.24 with a malfunctioning / clogged suprapubic catheter. Team is requesting a suprapubic catheter exchange with moderate sedation.   PLAN: IR Image guided suprapubic  catheter exchange  Risks and benefits discussed with the patient including bleeding, infection, damage to adjacent structures, bowel perforation/fistula connection, and sepsis.  All of the patient's questions were answered, patient is agreeable to proceed. Consent signed and in chart.   Thank you for this interesting consult.  I greatly enjoyed meeting Adam Rios. and look forward to participating in their care.  A copy of this report was sent to the requesting provider on this date.  Electronically Signed: Delon JAYSON Beagle, NP 06/15/2023, 9:13 AM   I spent a total of 40 Minutes    in face to face in clinical consultation, greater than 50% of which was counseling/coordinating care for suprapubic catheter exchange

## 2023-06-15 NOTE — ED Notes (Signed)
 Patient noted to have large dark tarry stool

## 2023-06-15 NOTE — ED Notes (Addendum)
 ED TO INPATIENT HANDOFF REPORT  ED Nurse Name and Phone #: Cat [phone]  S Name/Age/Gender Adam Rios 83 y.o. male Room/Bed: ED18HA/ED18HA  Code Status   Code Status: Full Code  Home/SNF/Other From: home Patient oriented to: self Is this baseline? Yes   Triage Complete: Triage complete  Chief Complaint Suprapubic catheter dysfunction, initial encounter [facility]) [T83.010A]  Triage Note Suprpubic catheter blocked and pus from penis -- per NP note.   Allergies Allergies  Allergen Reactions   Empagliflozin     Recurrent UTI with sepsis on jardiance.   Fentanyl  Nausea Only   Oxycodone  Nausea And Vomiting    Level of Care/Admitting Diagnosis ED Disposition     ED Disposition  Admit   Condition  --   Comment  Hospital Area: [facility] [[id number]]  Level of Care: Med-Surg [16]  Covid Evaluation: Asymptomatic - no recent exposure (last 10 days) testing not required  Diagnosis: Suprapubic catheter dysfunction, initial encounter [facility]) [[id number]]  Admitting Physician: LAWENCE MADISON LABOR [[id number]]  Attending Physician: LAWENCE MADISON LABOR [[id number]]          B Medical/Surgery History Past Medical History:  Diagnosis Date   Cognitive impairment    Complication of anesthesia    Fentanyl  causes nausea   Hemorrhagic cerebrovascular accident (CVA) (HCC)    Hypertension    Neurogenic bladder    Renal cell carcinoma (HCC) 2005   Renal disorder    Stroke (HCC)     01/17/18, 12/21   Some weakness and cognitive decline   Past Surgical History:  Procedure Laterality Date   APPENDECTOMY     CATARACT EXTRACTION W/PHACO Right 07/22/2020   Procedure: CATARACT EXTRACTION PHACO AND INTRAOCULAR LENS PLACEMENT (IOC) RIGHT;  Surgeon: Myrna Adine Anes, MD;  Location: [facility];  Service: Ophthalmology;  Laterality: Right;  4.77 0:44.2   CATARACT EXTRACTION W/PHACO Left 08/05/2020   Procedure: CATARACT EXTRACTION PHACO AND INTRAOCULAR LENS PLACEMENT (IOC)  LEFT 10.73 01:13.8;  Surgeon: Myrna Adine Anes, MD;  Location: [facility];  Service: Ophthalmology;  Laterality: Left;  Use eye stretcher not chair   HERNIA REPAIR     HIP ARTHROPLASTY Left 11/28/2022   Procedure: ARTHROPLASTY BIPOLAR HIP (HEMIARTHROPLASTY);  Surgeon: Tobie Priest, MD;  Location: [facility] OR[facility];  Service: Orthopedics;  Laterality: Left;   IR CATHETER TUBE CHANGE  06/15/2023   LUMBAR FUSION     NEPHRECTOMY Left 2005   TEMPORARY PACEMAKER N/A 05/03/2022   Procedure: TEMPORARY PACEMAKER;  Surgeon: Florencio Cara BIRCH, MD;  Location: [facility] IN[facility]AS[facility] LAB;  Service: Cardiovascular;  Laterality: N/A;     A IV Location/Drains/Wounds Patient Lines/Drains/Airways Status     Active Line/Drains/Airways     Name Placement date Placement time Site Days   Peripheral IV 06/14/23 20 G Anterior;Distal;Right;Upper Arm 06/14/23  --  Arm  1   Suprapubic Catheter Non-latex 16 Fr. 06/15/23  1615  Non-latex  less than 1   Wound / Incision (Open or Dehisced) 08/24/22 Irritant Dermatitis (Moisture Associated Skin Damage) Buttocks Right 08/24/22  --  Buttocks  295   Wound / Incision (Open or Dehisced) 08/24/22 Irritant Dermatitis (Moisture Associated Skin Damage) Buttocks Left 08/24/22  --  Buttocks  295   Wound / Incision (Open or Dehisced) 09/29/22 Other (Comment) Buttocks Bilateral Stage 1 pressure ulcer 09/29/22  0200  Buttocks  259   Wound / Incision (Open or Dehisced) 09/29/22 Other (Comment) Toe (Comment  which one) Anterior;Left Abrasion area scabbed third toe left foot 09/29/22  0200  Toe (Comment  which one)  259   Wound / Incision (Open or Dehisced) 11/28/22 Elbow Left 11/28/22  0000  Elbow  199   Wound / Incision (Open or Dehisced) 11/28/22 Elbow Right;Lateral 11/28/22  0000  Elbow  199            Intake/Output Last 24 hours No intake or output data in the 24 hours ending 06/15/23 2226  Labs/Imaging Results for orders placed or performed during the hospital encounter of  06/14/23 (from the past 48 hours)  Comprehensive metabolic panel     Status: Abnormal   Collection Time: 06/14/23  2:25 PM  Result Value Ref Range   Sodium 135 135 - 145 mmol/L   Potassium 4.6 3.5 - 5.1 mmol/L   Chloride 101 98 - 111 mmol/L   CO2 23 22 - 32 mmol/L   Glucose, Bld 110 (H) 70 - 99 mg/dL    Comment: Glucose reference range applies only to samples taken after fasting for at least 8 hours.   BUN 21 8 - 23 mg/dL   Creatinine, Ser 8.74 (H) 0.61 - 1.24 mg/dL   Calcium  8.7 (L) 8.9 - 10.3 mg/dL   Total Protein 6.9 6.5 - 8.1 g/dL   Albumin 3.0 (L) 3.5 - 5.0 g/dL   AST 17 15 - 41 U/L   ALT 17 0 - 44 U/L   Alkaline Phosphatase 77 38 - 126 U/L   Total Bilirubin 0.7 0.0 - 1.2 mg/dL   GFR, Estimated 57 (L) >60 mL/min    Comment: (NOTE) Calculated using the CKD-EPI Creatinine Equation (2021)    Anion gap 11 5 - 15    Comment: Performed at [facility], [address] [place] [address] [place], [place] [address]  CBC with Differential     Status: Abnormal   Collection Time: 06/14/23  2:25 PM  Result Value Ref Range   WBC 10.0 4.0 - 10.5 K/uL   RBC 4.96 4.22 - 5.81 MIL/uL   Hemoglobin 11.6 (L) 13.0 - 17.0 g/dL   HCT 61.6 (L) 60.9 - 47.9 %   MCV 77.2 (L) 80.0 - 100.0 fL   MCH 23.4 (L) 26.0 - 34.0 pg   MCHC 30.3 30.0 - 36.0 g/dL   RDW 80.5 (H) 88.4 - 84.4 %   Platelets 289 150 - 400 K/uL   nRBC 0.0 0.0 - 0.2 %   Neutrophils Relative % 73 %   Neutro Abs 7.2 1.7 - 7.7 K/uL   Lymphocytes Relative 16 %   Lymphs Abs 1.6 0.7 - 4.0 K/uL   Monocytes Relative 9 %   Monocytes Absolute 0.9 0.1 - 1.0 K/uL   Eosinophils Relative 2 %   Eosinophils Absolute 0.2 0.0 - 0.5 K/uL   Basophils Relative 0 %   Basophils Absolute 0.0 0.0 - 0.1 K/uL   Immature Granulocytes 0 %   Abs Immature Granulocytes 0.03 0.00 - 0.07 K/uL    Comment: Performed at [facility], [address] [place], [place] [address]  Basic metabolic panel     Status: Abnormal   Collection Time: 06/15/23  5:50 AM   Result Value Ref Range   Sodium 135 135 - 145 mmol/L   Potassium 4.0 3.5 - 5.1 mmol/L   Chloride 104 98 - 111 mmol/L   CO2 23 22 - 32 mmol/L   Glucose, Bld 91 70 - 99 mg/dL    Comment: Glucose reference range applies only to samples taken after fasting for at least 8 hours.  BUN 21 8 - 23 mg/dL   Creatinine, Ser 8.99 0.61 - 1.24 mg/dL   Calcium  8.5 (L) 8.9 - 10.3 mg/dL   GFR, Estimated >39 >39 mL/min    Comment: (NOTE) Calculated using the CKD-EPI Creatinine Equation (2021)    Anion gap 8 5 - 15    Comment: Performed at [facility], [address] [place], [place] [address]  CBC     Status: Abnormal   Collection Time: 06/15/23  5:50 AM  Result Value Ref Range   WBC 7.6 4.0 - 10.5 K/uL   RBC 4.46 4.22 - 5.81 MIL/uL   Hemoglobin 10.5 (L) 13.0 - 17.0 g/dL   HCT 66.1 (L) 60.9 - 47.9 %   MCV 75.8 (L) 80.0 - 100.0 fL   MCH 23.5 (L) 26.0 - 34.0 pg   MCHC 31.1 30.0 - 36.0 g/dL   RDW 80.8 (H) 88.4 - 84.4 %   Platelets 243 150 - 400 K/uL   nRBC 0.0 0.0 - 0.2 %    Comment: Performed at [facility], [address] [place], [place] [address]  Urinalysis, Routine w reflex microscopic -Urine, Suprapubic     Status: Abnormal   Collection Time: 06/15/23  8:30 AM  Result Value Ref Range   Color, Urine YELLOW (A) YELLOW   APPearance TURBID (A) CLEAR   Specific Gravity, Urine 1.012 1.005 - 1.030   pH 9.0 (H) 5.0 - 8.0   Glucose, UA NEGATIVE NEGATIVE mg/dL   Hgb urine dipstick SMALL (A) NEGATIVE   Bilirubin Urine NEGATIVE NEGATIVE   Ketones, ur NEGATIVE NEGATIVE mg/dL   Protein, ur 899 (A) NEGATIVE mg/dL   Nitrite NEGATIVE NEGATIVE   Leukocytes,Ua MODERATE (A) NEGATIVE   RBC / HPF 0-5 0 - 5 RBC/hpf   WBC, UA 0-5 0 - 5 WBC/hpf   Bacteria, UA FEW (A) NONE SEEN   Squamous Epithelial / HPF 0-5 0 - 5 /HPF   Mucus PRESENT    Triple Phosphate Crystal PRESENT     Comment: Performed at [facility], [address] [place], [place] [address]  Occult blood  card to lab, stool     Status: None   Collection Time: 06/15/23  8:30 AM  Result Value Ref Range   Fecal Occult Bld NEGATIVE NEGATIVE    Comment: Performed at [facility], [address] [place], [place] [address]   IR Catheter Tube Change Result Date: 06/15/2023 INDICATION: 83 year old male with prior suprapubic catheter placed at outside hospital. Catheter has become blocked and he presents for exchange EXAM: IMAGE GUIDED EXCHANGE/PLACEMENT OF SUPRAPUBIC CATHETER COMPARISON:  None MEDICATIONS: None ANESTHESIA/SEDATION: Moderate (conscious) sedation was not employed during this procedure. A total of Versed  0.5 mg and Fentanyl  0 mcg was administered intravenously by the radiology nurse. Total intra-service moderate Sedation Time: 0 minutes. The patient's level of consciousness and vital signs were monitored continuously by radiology nursing throughout the procedure under my direct supervision. CONTRAST:  10 cc-administered into the collecting system(s) FLUOROSCOPY: Radiation Exposure Index (as provided by the fluoroscopic device): 0.9 mGy Kerma COMPLICATIONS: None PROCEDURE: Informed written consent was obtained from the patient after a thorough discussion of the procedural risks, benefits and alternatives. All questions were addressed. Maximal Sterile Barrier Technique was utilized including caps, mask, sterile gowns, sterile gloves, sterile drape, hand hygiene and skin antiseptic. A timeout was performed prior to the initiation of the procedure. Patient was positioned supine under the image intensifier and the suprapubic region and indwelling catheter were  prepped and draped in the usual sterile fashion. Scout images were acquired. We confirmed that the catheter was occluded. The balloon was deflated and the catheter was removed. A Kumpe the catheter was then navigated through the soft tissue tract into the urinary bladder. Contrast injected confirmed location. Amplatz wire was placed into the  urinary bladder and the catheter was removed. A new 16 French Council tip catheter was placed on the Amplatz wire into the urinary bladder. The wire was removed. Contrast was injected confirming location and 9 cc of saline used to inflate the balloon. Attached to gravity drainage. Final image was stored. Patient tolerated the procedure well and remained hemodynamically stable throughout. No complications were encountered and no significant blood loss. IMPRESSION: Status post image guided exchange/placement of a new suprapubic catheter. Signed, Ami RAMAN. Alona ROSALEA GRAVER, RPVI [facility] Electronically Signed   By: Ami Alona D.O.   On: 06/15/2023 16:56    Pending Labs Unresulted Labs (From admission, onward)     Start     Ordered   06/16/23 0500  CBC  Tomorrow morning,   R        06/15/23 1519   06/16/23 0500  Basic metabolic panel  Tomorrow morning,   R        06/15/23 1519   06/15/23 1509  Culture, Urine (Do not remove urinary catheter, catheter placed by urology or difficult to place)  (Urine Culture)  Add-on,   AD       Question:  Indication  Answer:  Altered mental status (if no other cause identified)   06/15/23 1508   Unscheduled  Occult blood card to lab, stool  As needed,   TIMED      06/15/23 0824            Vitals/Pain Today's Vitals   06/15/23 1610 06/15/23 1630 06/15/23 1800 06/15/23 2100  BP:  112/74 (!) 146/84 124/78  Pulse:  71 70 73  Resp:  14 18 18   Temp:    97.7 F (36.5 C)  TempSrc:    Oral  SpO2:  91% 95% 95%  Weight:      Height:      PainSc: 0-No pain 0-No pain      Isolation Precautions No active isolations  Medications Medications  enoxaparin  (LOVENOX ) injection 40 mg (40 mg Subcutaneous Given 06/15/23 2127)  acetaminophen  (TYLENOL ) tablet 650 mg (has no administration in time range)    Or  acetaminophen  (TYLENOL ) suppository 650 mg (has no administration in time range)  traZODone   (DESYREL ) tablet 25 mg (has no administration in time range)  magnesium  hydroxide (MILK OF MAGNESIA) suspension 30 mL (has no administration in time range)  ondansetron  (ZOFRAN ) tablet 4 mg (has no administration in time range)    Or  ondansetron  (ZOFRAN ) injection 4 mg (has no administration in time range)  atorvastatin  (LIPITOR) tablet 40 mg (40 mg Oral Given 06/15/23 0954)  metoprolol  succinate (TOPROL -XL) 24 hr tablet 12.5 mg (12.5 mg Oral Given 06/15/23 0953)  spironolactone  (ALDACTONE ) tablet 25 mg (25 mg Oral Given 06/15/23 0955)  sertraline  (ZOLOFT ) tablet 50 mg (50 mg Oral Given 06/15/23 0954)  psyllium (HYDROCIL/METAMUCIL) 1 packet (1 packet Oral Given 06/15/23 2127)  senna-docusate (Senokot-S) tablet 1 tablet (has no administration in time range)  mirabegron  ER (MYRBETRIQ ) tablet 25 mg (25 mg Oral Given 06/15/23 0955)  finasteride  (PROSCAR ) tablet 5 mg (5 mg Oral Given 06/15/23 0957)  fesoterodine  (TOVIAZ ) tablet 4 mg (4  mg Oral Given 06/15/23 0956)  tamsulosin  (FLOMAX ) capsule 0.4 mg (0.4 mg Oral Given 06/15/23 0953)  Fe Fum-Vit C-Vit B12-FA (TRIGELS-F FORTE) capsule 1 capsule (1 capsule Oral Given 06/15/23 0956)  cyanocobalamin  (VITAMIN B12) tablet 1,000 mcg (1,000 mcg Oral Given 06/15/23 0953)  gabapentin  (NEURONTIN ) capsule 600 mg (600 mg Oral Given 06/15/23 2127)  magnesium  oxide (MAG-OX) tablet 800 mg (800 mg Oral Given 06/15/23 2127)  pantoprazole  (PROTONIX ) EC tablet 40 mg (40 mg Oral Not Given 06/15/23 1652)  cefTRIAXone  (ROCEPHIN ) 2 g in sodium chloride  0.9 % 100 mL IVPB (0 g Intravenous Stopped 06/15/23 1839)  midazolam  (VERSED ) 2 MG/2ML injection (has no administration in time range)  midazolam  (VERSED ) injection (0.5 mg Intravenous Given 06/15/23 1607)  iohexol  (OMNIPAQUE ) 300 MG/ML solution 8 mL (8 mLs Per Tube Contrast Given 06/15/23 1619)    Mobility non-ambulatory     Focused Assessments    R Recommendations: See Admitting Provider Note  Report given to:   Additional  Notes:

## 2023-06-15 NOTE — ED Notes (Signed)
 Patient's depends changed, new chux applied.

## 2023-06-15 NOTE — Hospital Course (Addendum)
 Taken from H&P.  Adam Rios. is a 83 y.o. Caucasian male with medical history significant for CVA, hypertension, neurogenic bladder status post suprapubic catheter placement and cognitive impairment, who presented to the emergency room with acute onset of urinary retention.  His suprapubic catheter was not working.  On presentation hemodynamically stable, labs with creatinine of 1.25.  Hemoglobin 11.6 which is comparable to previous levels.  IR and urology was consulted.  IR will replace his catheter in the morning.  1/14: Vital stable.  Nursing concern of 1 large dark tarry colored stool earlier in the morning.  Hemoglobin with 1 point dropped to 10.5, all cell line decreased.  Creatinine improved to 1.  FOBT negative, likely due to iron  supplement. IR replaced his suprapubic catheter. 1/15.  Ua looked negative. Hold off on abx and follow urine culture.

## 2023-06-15 NOTE — Progress Notes (Signed)
  Progress Note   Patient: Adam Rios. FMW:[id number] DOB: 10/28/1940 DOA: 06/14/2023     0 DOS: the patient was seen and examined on 06/15/2023   Brief hospital course: Taken from H&P.  Adam Rios. is a 83 y.o. Caucasian male with medical history significant for CVA, hypertension, neurogenic bladder status post suprapubic catheter placement and cognitive impairment, who presented to the emergency room with acute onset of urinary retention.  His suprapubic catheter was not working.  On presentation hemodynamically stable, labs with creatinine of 1.25.  Hemoglobin 11.6 which is comparable to previous levels.  IR and urology was consulted.  IR will replace his catheter in the morning.  1/14: Vital stable.  Nursing concern of 1 large dark tarry colored stool earlier in the morning.  Hemoglobin with 1 point dropped to 10.5, all cell line decreased.  Creatinine improved to 1.  FOBT negative, likely due to iron  supplement. UA with leukocytosis and many bacteria-urine culture ordered and starting on Rocephin .  IR to replace his suprapubic catheter.      Assessment and Plan: * Suprapubic catheter dysfunction, initial encounter (HCC) IR was consulted to replace suprapubic catheter, UA with concern of UTI -Urine culture ordered and starting on Rocephin  - Will consider placing coud catheter if he has significant discomfort and increasing amount of retained urine.   Chronic diastolic CHF (congestive heart failure) (HCC) - We will continue Toprol -XL and Aldactone     BPH with obstruction/lower urinary tract symptoms - We will continue Flomax  and Proscar .   Essential hypertension - We will continue antihypertensive therapy.  Dyslipidemia - Continue statin therapy.  History of CVA (cerebrovascular accident) - We will continue antiplatelet therapy as well as statin therapy.  Depression - We will continue Zoloft .  GERD without esophagitis - Continue PPI  therapy.   Subjective: Patient was seen and examined today.  Seems like oriented to name only.  Having some lower abdominal discomfort.  Physical Exam: Vitals:   06/15/23 0830 06/15/23 1003 06/15/23 1100 06/15/23 1454  BP: (!) 140/84   127/72  Pulse: (!) 59  (!) 58 71  Resp: 20   15  Temp:  97.6 F (36.4 C)    TempSrc:  Oral    SpO2: 95%  98% 98%  Weight:      Height:       General.  Frail elderly man, in no acute distress. Pulmonary.  Lungs clear bilaterally, normal respiratory effort. CV.  Regular rate and rhythm, no JVD, rub or murmur. Abdomen.  Soft, mild lower abdominal tenderness, nondistended, BS positive. CNS.  Alert and oriented to name only.  No focal neurologic deficit. Extremities.  No edema, no cyanosis, pulses intact and symmetrical.  Data Reviewed: Prior data reviewed  Family Communication: Talked with [facility]. Called daughter with no response  Disposition: Status is: Observation The patient will require care spanning > 2 midnights and should be moved to inpatient because: Needs suprapubic catheter exchange.   Planned Discharge Destination: Skilled nursing facility   Time spent: 50 minutes  This record has been created using Conservation officer, historic buildings. Errors have been sought and corrected,but may not always be located. Such creation errors do not reflect on the standard of care.   Author: Amaryllis Dare, MD 06/15/2023 3:14 PM  For on call review [email or web address].

## 2023-06-15 NOTE — Procedures (Signed)
 Interventional Radiology Procedure Note  Procedure: Image guided replacement of non-functioning supra-pubic catheter. 43F council tip placed to gravity  Complications: None  EBL: None    Recommendations: - Routine supra-pubic care - follow up on schedule with care team at [facility] - routine wound care - ok to remove urethral catheter  Signed,  Ami RAMAN. Alona, DO

## 2023-06-16 DIAGNOSIS — F3289 Other specified depressive episodes: Secondary | ICD-10-CM

## 2023-06-16 DIAGNOSIS — N401 Enlarged prostate with lower urinary tract symptoms: Secondary | ICD-10-CM | POA: Diagnosis not present

## 2023-06-16 DIAGNOSIS — K219 Gastro-esophageal reflux disease without esophagitis: Secondary | ICD-10-CM

## 2023-06-16 DIAGNOSIS — T83010A Breakdown (mechanical) of cystostomy catheter, initial encounter: Secondary | ICD-10-CM | POA: Diagnosis not present

## 2023-06-16 DIAGNOSIS — I10 Essential (primary) hypertension: Secondary | ICD-10-CM | POA: Diagnosis not present

## 2023-06-16 DIAGNOSIS — I5032 Chronic diastolic (congestive) heart failure: Secondary | ICD-10-CM | POA: Diagnosis not present

## 2023-06-16 LAB — CBC
HCT: 36.7 % — ABNORMAL LOW (ref 39.0–52.0)
Hemoglobin: 11.2 g/dL — ABNORMAL LOW (ref 13.0–17.0)
MCH: 23.5 pg — ABNORMAL LOW (ref 26.0–34.0)
MCHC: 30.5 g/dL (ref 30.0–36.0)
MCV: 77.1 fL — ABNORMAL LOW (ref 80.0–100.0)
Platelets: 260 10*3/uL (ref 150–400)
RBC: 4.76 MIL/uL (ref 4.22–5.81)
RDW: 19 % — ABNORMAL HIGH (ref 11.5–15.5)
WBC: 8.1 10*3/uL (ref 4.0–10.5)
nRBC: 0 % (ref 0.0–0.2)

## 2023-06-16 LAB — BASIC METABOLIC PANEL
Anion gap: 11 (ref 5–15)
BUN: 18 mg/dL (ref 8–23)
CO2: 22 mmol/L (ref 22–32)
Calcium: 8.5 mg/dL — ABNORMAL LOW (ref 8.9–10.3)
Chloride: 100 mmol/L (ref 98–111)
Creatinine, Ser: 0.97 mg/dL (ref 0.61–1.24)
GFR, Estimated: >60 mL/min (ref >60)
Glucose, Bld: 73 mg/dL (ref 70–99)
Potassium: 4.2 mmol/L (ref 3.5–5.1)
Sodium: 133 mmol/L — ABNORMAL LOW (ref 135–145)

## 2023-06-16 MED ORDER — TRAMADOL HCL 50 MG PO TABS: 50.0000 mg | ORAL_TABLET | Freq: Four times a day (QID) | ORAL | 0 refills | Status: DC | PRN

## 2023-06-16 MED ORDER — METOPROLOL SUCCINATE ER 25 MG PO TB24: 12.5000 mg | ORAL_TABLET | Freq: Every day | ORAL | Status: DC

## 2023-06-16 MED ORDER — CHLORHEXIDINE GLUCONATE CLOTH 2 % EX PADS
6.0000 | MEDICATED_PAD | Freq: Every day | CUTANEOUS | Status: DC
  Administered 2023-06-16: 6 via TOPICAL

## 2023-06-16 MED ORDER — GABAPENTIN 300 MG PO CAPS: 600.0000 mg | ORAL_CAPSULE | Freq: Two times a day (BID) | ORAL | 0 refills | Status: DC

## 2023-06-16 NOTE — Assessment & Plan Note (Signed)
 Continue Toprol -XL and Aldactone 

## 2023-06-16 NOTE — TOC Transition Note (Signed)
 Transition of Care [facility]) - Discharge Note   Patient Details  Name: Adam Rios. MRN: [id number] Date of Birth: July 17, 1940  Transition of Care [facility]) CM/SW Contact:  Aleene Amor, LCSW Phone Number: 06/16/2023, 9:46 AM   Clinical Narrative:   Pt to discharge back to [facility] for respite. [facility] notified. [facility] reports [facility] will [facility] patient. Deborah notified. DC Summary sent.    Final next level of care: Skilled Nursing Facility Barriers to Discharge: Barriers Resolved   Patient Goals and CMS Choice            Discharge Placement              Patient [facility] bed at: [facility] Patient to be transferred to facility by: [facility]   Patient and family notified of of transfer: 06/16/23  Discharge Plan and Services Additional resources added to the After Visit Summary for                                       Social Drivers of Health (SDOH) Interventions SDOH Screenings   Food Insecurity: No Food Insecurity (06/15/2023)  Housing: Low Risk  (06/15/2023)  Transportation Needs: No Transportation Needs (06/15/2023)  Utilities: Not At Risk (06/15/2023)  Financial Resource Strain: Low Risk  (05/04/2022)   Received from [facility]  Physical Activity: Inactive (03/29/2018)   Received from [facility], [facility]  Social Connections: Unknown (06/15/2023)  Stress: No Stress Concern Present (03/29/2018)   Received from [facility], [facility]  Tobacco Use: Medium Risk (06/14/2023)  Health Literacy: Low Risk  (05/24/2023)   Received from [facility]     Readmission Risk Interventions    08/11/2022    4:53 PM 06/11/2022    2:19 PM 02/12/2022    2:17 PM  Readmission Risk Prevention Plan  Transportation Screening Complete Complete Complete  PCP or Specialist Appt within 3-5 Days  Complete Complete  HRI or Home Care Consult  Complete Complete  Social Work Consult for Recovery Care  Planning/Counseling  Complete Complete  Palliative Care Screening  Not Applicable Not Applicable  Medication Review Oceanographer) Complete Complete Referral to Pharmacy  [facility] or Home Care Consult Complete    Skilled Nursing Facility Complete

## 2023-06-16 NOTE — Assessment & Plan Note (Signed)
 IR replaced suprapubic catheter on 1/14.  Please continue suprapubic catheter care at facility.

## 2023-06-16 NOTE — Assessment & Plan Note (Signed)
 Continue PPI therapy.

## 2023-06-16 NOTE — Plan of Care (Signed)

## 2023-06-16 NOTE — Assessment & Plan Note (Signed)
 Continue Zoloft

## 2023-06-16 NOTE — Assessment & Plan Note (Signed)
 Does not look like he is on asa and plavix  at this time.  Can go back on if taking these at home

## 2023-06-16 NOTE — Assessment & Plan Note (Signed)
 Continue toprol  and aldactone 

## 2023-06-16 NOTE — Discharge Summary (Signed)
 Physician Discharge Summary   Patient: Adam Ha. MRN: [id number] DOB: 03/17/1941  Admit date:     06/14/2023  Discharge date: 06/16/23  Discharge Physician: Verla Glaze   PCP: Neda Balk, MD   Recommendations at discharge:    Follow up team at rehab Follow up [facility] program  Discharge Diagnoses: Principal Problem:   Suprapubic catheter dysfunction, initial encounter [facility]) Active Problems:   BPH with obstruction/lower urinary tract symptoms   Chronic diastolic CHF (congestive heart failure) (HCC)   Essential hypertension   Dyslipidemia   History of CVA (cerebrovascular accident)   Depression   GERD without esophagitis   Hospital Course: Taken from H&P.  Adam Pile. is a 83 y.o. Caucasian male with medical history significant for CVA, hypertension, neurogenic bladder status post suprapubic catheter placement and cognitive impairment, who presented to the emergency room with acute onset of urinary retention.  His suprapubic catheter was not working.  On presentation hemodynamically stable, labs with creatinine of 1.25.  Hemoglobin 11.6 which is comparable to previous levels.  IR and urology was consulted.  IR will replace his catheter in the morning.  1/14: Vital stable.  Nursing concern of 1 large dark tarry colored stool earlier in the morning.  Hemoglobin with 1 point dropped to 10.5, all cell line decreased.  Creatinine improved to 1.  FOBT negative, likely due to iron  supplement. IR replaced his suprapubic catheter. 1/15.  Ua looked negative. Hold off on abx and follow urine culture.      Assessment and Plan: * Suprapubic catheter dysfunction, initial encounter (HCC) IR replaced suprapubic catheter on 1/14.  Please continue suprapubic catheter care at facility.   Chronic diastolic CHF (congestive heart failure) (HCC) Continue Toprol -XL and Aldactone     BPH with obstruction/lower urinary tract symptoms Continue Flomax  and  Proscar .   Essential hypertension Continue toprol  and aldactone   Dyslipidemia - Continue statin therapy.  History of CVA (cerebrovascular accident) Does not look like he is on asa and plavix  at this time.  Can go back on if taking these at home  Depression Continue Zoloft .  GERD without esophagitis - Continue PPI therapy.         Consultants: Interventional radiology Procedures performed: suprapubic catheter replacement Disposition: Rehabilitation facility Diet recommendation:  Cardiac diet DISCHARGE MEDICATION: Allergies as of 06/16/2023       Reactions   Empagliflozin    Recurrent UTI with sepsis on jardiance.   Fentanyl  Nausea Only   Oxycodone  Nausea And Vomiting        Medication List     STOP taking these medications    aspirin  EC 81 MG tablet   clopidogrel  75 MG tablet Commonly known as: PLAVIX    donepezil  10 MG tablet Commonly known as: ARICEPT    iron  polysaccharides 150 MG capsule Commonly known as: NIFEREX   oxyCODONE  5 MG immediate release tablet Commonly known as: Oxy IR/ROXICODONE        TAKE these medications    Acetaminophen  Extra Strength 500 MG Tabs Take 1,000 mg by mouth 3 (three) times daily as needed (PAIN).   atorvastatin  40 MG tablet Commonly known as: LIPITOR Take 40 mg by mouth daily.   CalProtect 0.44-20.6 % Oint Generic drug: Menthol -Zinc Oxide Apply 1 Application topically 2 (two) times daily.   [employer] Cortisone Maximum Strength 1 % Generic drug: hydrocortisone  cream Apply 1 Application topically as needed for itching.   cyanocobalamin  1000 MCG tablet Commonly known as: VITAMIN B12 Take 1,000 mcg by mouth daily.  docusate sodium  100 MG capsule Commonly known as: COLACE Take 1 capsule (100 mg total) by mouth 2 (two) times daily.   finasteride  5 MG tablet Commonly known as: PROSCAR  Take 5 mg by mouth daily.   gabapentin  300 MG capsule Commonly known as: NEURONTIN  Take 2 capsules (600 mg total) by mouth 2  (two) times daily.   magnesium  oxide 400 (240 Mg) MG tablet Commonly known as: MAG-OX Take 2 tablets by mouth 2 (two) times daily.   metoCLOPramide  5 MG tablet Commonly known as: REGLAN  Take 1-2 tablets (5-10 mg total) by mouth every 8 (eight) hours as needed for nausea (if ondansetron  (ZOFRAN ) ineffective.).   metoprolol  succinate 25 MG 24 hr tablet Commonly known as: TOPROL -XL Take 0.5 tablets (12.5 mg total) by mouth daily.   Myrbetriq  25 MG Tb24 tablet Generic drug: mirabegron  ER Take 25 mg by mouth daily.   ondansetron  4 MG tablet Commonly known as: ZOFRAN  Take 1 tablet (4 mg total) by mouth every 6 (six) hours as needed for nausea.   pantoprazole  20 MG tablet Commonly known as: PROTONIX  Take 1 tablet (20 mg total) by mouth daily.   Poly-Iron  150 Forte 150-25-1 MG-MCG-MG Caps Generic drug: Iron  Polysacch Cmplx-B12-FA Take 1 capsule by mouth daily.   Psyllium Fiber 0.52 g Caps Take 1 capsule by mouth 2 (two) times daily.   senna-docusate 8.6-50 MG tablet Commonly known as: Senokot-S Take 1 tablet by mouth 2 (two) times daily as needed.   sertraline  50 MG tablet Commonly known as: ZOLOFT  Take 50 mg by mouth daily.   solifenacin 5 MG tablet Commonly known as: VESICARE Take 5 mg by mouth daily.   spironolactone  25 MG tablet Commonly known as: ALDACTONE  Take 1 tablet by mouth daily.   tamsulosin  0.4 MG Caps capsule Commonly known as: FLOMAX  Take 1 capsule by mouth daily.   Torsemide  40 MG Tabs Take 40 mg by mouth daily.   traMADol  50 MG tablet Commonly known as: ULTRAM  Take 1 tablet (50 mg total) by mouth every 6 (six) hours as needed for moderate pain (pain score 4-6).   Vitamin D  (Ergocalciferol ) 1.25 MG (50000 UNIT) Caps capsule Commonly known as: DRISDOL  Take 50,000 Units by mouth every 7 (seven) days.        Follow-up Information     [facility] program Follow up.          [facility] Follow up.                 Discharge  Exam: Filed Weights   06/14/23 1422 06/14/23 1726 06/15/23 2256  Weight: 90.7 kg 90.7 kg 85.3 kg   Physical Exam HENT:     Head: Normocephalic.     Mouth/Throat:     Pharynx: No oropharyngeal exudate.  Eyes:     General: Lids are normal.     Conjunctiva/sclera: Conjunctivae normal.  Cardiovascular:     Rate and Rhythm: Normal rate and regular rhythm.     Heart sounds: Normal heart sounds, S1 normal and S2 normal.  Pulmonary:     Breath sounds: No decreased breath sounds, wheezing, rhonchi or rales.  Abdominal:     Palpations: Abdomen is soft.     Tenderness: There is no abdominal tenderness.  Musculoskeletal:     Right lower leg: No swelling.     Left lower leg: No swelling.  Skin:    General: Skin is warm.     Findings: No rash.  Neurological:     Mental Status:  He is alert.      Condition at discharge: stable  The results of significant diagnostics from this hospitalization (including imaging, microbiology, ancillary and laboratory) are listed below for reference.   Imaging Studies: IR Catheter Tube Change Result Date: 06/15/2023 INDICATION: 83 year old male with prior suprapubic catheter placed at outside hospital. Catheter has become blocked and he presents for exchange EXAM: IMAGE GUIDED EXCHANGE/PLACEMENT OF SUPRAPUBIC CATHETER COMPARISON:  None MEDICATIONS: None ANESTHESIA/SEDATION: Moderate (conscious) sedation was not employed during this procedure. A total of Versed  0.5 mg and Fentanyl  0 mcg was administered intravenously by the radiology nurse. Total intra-service moderate Sedation Time: 0 minutes. The patient's level of consciousness and vital signs were monitored continuously by radiology nursing throughout the procedure under my direct supervision. CONTRAST:  10 cc-administered into the collecting system(s) FLUOROSCOPY: Radiation Exposure Index (as provided by the fluoroscopic device): 0.9 mGy Kerma COMPLICATIONS: None PROCEDURE: Informed written consent was  obtained from the patient after a thorough discussion of the procedural risks, benefits and alternatives. All questions were addressed. Maximal Sterile Barrier Technique was utilized including caps, mask, sterile gowns, sterile gloves, sterile drape, hand hygiene and skin antiseptic. A timeout was performed prior to the initiation of the procedure. Patient was positioned supine under the image intensifier and the suprapubic region and indwelling catheter were prepped and draped in the usual sterile fashion. Scout images were acquired. We confirmed that the catheter was occluded. The balloon was deflated and the catheter was removed. A Kumpe the catheter was then navigated through the soft tissue tract into the urinary bladder. Contrast injected confirmed location. Amplatz wire was placed into the urinary bladder and the catheter was removed. A new 16 [place] tip catheter was placed on the Amplatz wire into the urinary bladder. The wire was removed. Contrast was injected confirming location and 9 cc of saline used to inflate the balloon. Attached to gravity drainage. Final image was stored. Patient tolerated the procedure well and remained hemodynamically stable throughout. No complications were encountered and no significant blood loss. IMPRESSION: Status post image guided exchange/placement of a new suprapubic catheter. Signed, Adam Rios. Adam Rios, RPVI [facility] Radiology Electronically Signed   By: Myrlene Asper D.O.   On: 06/15/2023 16:56    Microbiology: Follow up urine culture  Labs: CBC: Recent Labs  Lab 06/14/23 1425 06/15/23 0550 06/16/23 0436  WBC 10.0 7.6 8.1  NEUTROABS 7.2  --   --   HGB 11.6* 10.5* 11.2*  HCT 38.3* 33.8* 36.7*  MCV 77.2* 75.8* 77.1*  PLT 289 243 260   Basic Metabolic Panel: Recent Labs  Lab 06/14/23 1425 06/15/23 0550 06/16/23 0436  NA 135 135 133*  K 4.6 4.0 4.2  CL 101 104 100  CO2 23 23 22    GLUCOSE 110* 91 73  BUN 21 21 18   CREATININE 1.25* 1.00 0.97  CALCIUM  8.7* 8.5* 8.5*   Liver Function Tests: Recent Labs  Lab 06/14/23 1425  AST 17  ALT 17  ALKPHOS 77  BILITOT 0.7  PROT 6.9  ALBUMIN 3.0*   CBG: No results for input(s): "GLUCAP" in the last 168 hours.  Discharge time spent: greater than 30 minutes.  Signed: Verla Glaze, MD [facility] 06/16/2023

## 2023-06-16 NOTE — Assessment & Plan Note (Signed)
 Continue Flomax and Proscar

## 2023-06-17 LAB — URINE CULTURE: Culture: >=100000 — AB

## 2023-06-17 NOTE — Progress Notes (Signed)
Called Dr Arta Silence at [place].  Urine culture growing greater than 100,000 gm negative rods.  They will decide on starting antibiotics.  Likely will not have the full culture and sensitivity still tomorrow.  Dr. Alford Highland

## 2023-08-03 ENCOUNTER — Emergency Department
Admission: EM | Admit: 2023-08-03 | Discharge: 2023-08-03 | Disposition: A | Discharge status: Home / Self Care | Payer: Medicare (Managed Care) | Attending: Emergency Medicine | Admitting: Emergency Medicine

## 2023-08-03 ENCOUNTER — Other Ambulatory Visit: Payer: Self-pay

## 2023-08-03 ENCOUNTER — Emergency Department: Payer: Medicare (Managed Care)

## 2023-08-03 DIAGNOSIS — F0390 Unspecified dementia without behavioral disturbance: Secondary | ICD-10-CM | POA: Diagnosis not present

## 2023-08-03 DIAGNOSIS — I10 Essential (primary) hypertension: Secondary | ICD-10-CM | POA: Diagnosis not present

## 2023-08-03 DIAGNOSIS — R519 Headache, unspecified: Secondary | ICD-10-CM | POA: Diagnosis present

## 2023-08-03 DIAGNOSIS — Z8673 Personal history of transient ischemic attack (TIA), and cerebral infarction without residual deficits: Secondary | ICD-10-CM | POA: Diagnosis not present

## 2023-08-03 LAB — CBC WITH DIFFERENTIAL/PLATELET
Abs Immature Granulocytes: 0.03 10*3/uL (ref 0.00–0.07)
Basophils Absolute: 0 10*3/uL (ref 0.0–0.1)
Basophils Relative: 0 %
Eosinophils Absolute: 0.2 10*3/uL (ref 0.0–0.5)
Eosinophils Relative: 2 %
HCT: 35.1 % — ABNORMAL LOW (ref 39.0–52.0)
Hemoglobin: 10.8 g/dL — ABNORMAL LOW (ref 13.0–17.0)
Immature Granulocytes: 0 %
Lymphocytes Relative: 19 %
Lymphs Abs: 1.7 10*3/uL (ref 0.7–4.0)
MCH: 24.7 pg — ABNORMAL LOW (ref 26.0–34.0)
MCHC: 30.8 g/dL (ref 30.0–36.0)
MCV: 80.3 fL (ref 80.0–100.0)
Monocytes Absolute: 0.6 10*3/uL (ref 0.1–1.0)
Monocytes Relative: 7 %
Neutro Abs: 6.7 10*3/uL (ref 1.7–7.7)
Neutrophils Relative %: 72 %
Platelets: 219 10*3/uL (ref 150–400)
RBC: 4.37 MIL/uL (ref 4.22–5.81)
RDW: 18.5 % — ABNORMAL HIGH (ref 11.5–15.5)
WBC: 9.3 10*3/uL (ref 4.0–10.5)
nRBC: 0 % (ref 0.0–0.2)

## 2023-08-03 LAB — BASIC METABOLIC PANEL
Anion gap: 11 (ref 5–15)
BUN: 14 mg/dL (ref 8–23)
CO2: 22 mmol/L (ref 22–32)
Calcium: 8.5 mg/dL — ABNORMAL LOW (ref 8.9–10.3)
Chloride: 101 mmol/L (ref 98–111)
Creatinine, Ser: 1.21 mg/dL (ref 0.61–1.24)
GFR, Estimated: 60 mL/min — ABNORMAL LOW (ref >60)
Glucose, Bld: 98 mg/dL (ref 70–99)
Potassium: 3.9 mmol/L (ref 3.5–5.1)
Sodium: 134 mmol/L — ABNORMAL LOW (ref 135–145)

## 2023-08-03 MED ORDER — ACETAMINOPHEN 500 MG PO TABS
1000.0000 mg | ORAL_TABLET | Freq: Once | ORAL | Status: AC
  Administered 2023-08-03: 1000 mg via ORAL
  Filled 2023-08-03: qty 2

## 2023-08-03 NOTE — ED Triage Notes (Signed)
 Facility Report: Per [facility] provider, pt had unwitnessed fall while getting out of bed @ facility [facility]) at approx. 1900. Per staff, pt was at baseline until later checked on and staff noted pt to be more confused and agitated based from his baseline. Pt currently being treated for UTI and has active C-Diff isolation precaution. Per provider, pt with recent change from suprapubic catheter to foley catheter.

## 2023-08-03 NOTE — ED Provider Notes (Signed)
 [facility] Provider Note    Event Date/Time   First MD Initiated Contact with Patient 08/03/23 0033     (approximate)   History   Chief Complaint: Fall   HPI  Adam Volkov. is a 83 y.o. male with a history of hypertension, vascular dementia, prior stroke, renal cancer status post nephrectomy who was sent to the ED due to an unwitnessed fall at his facility [facility].  Patient denies falling, states that he noticed that he was losing his balance while wearing socks on the floor, so he lowered himself to the ground.  He denies any injury, denies any pain.  Patient does report head and neck pain          Physical Exam   Triage Vital Signs: ED Triage Vitals  Encounter Vitals Group     BP 08/03/23 0039 112/63     Systolic BP Percentile --      Diastolic BP Percentile --      Pulse Rate 08/03/23 0039 70     Resp 08/03/23 0039 20     Temp 08/03/23 0039 97.8 F (36.6 C)     Temp Source 08/03/23 0039 Oral     SpO2 08/03/23 0039 97 %     Weight --      Height --      Head Circumference --      Peak Flow --      Pain Score 08/03/23 0041 0     Pain Loc --      Pain Education --      Exclude from Growth Chart --     Most recent vital signs: Vitals:   08/03/23 0039  BP: 112/63  Pulse: 70  Resp: 20  Temp: 97.8 F (36.6 C)  SpO2: 97%    General: Awake, no distress.  Alert, oriented to person and place. CV:  Good peripheral perfusion.  Regular rate.  Normal distal pulses.  Implanted pacemaker device present Resp:  Normal effort.  Clear to auscultation bilaterally Abd:  No distention.  Nontender Other:  Full range of motion all extremities, no focal bony tenderness.  No wounds, no contusions or lacerations.  No signs of head trauma.  Urinary catheter in place with clear urine in the tubing.   ED Results / Procedures / Treatments   Labs (all labs ordered are listed, but only abnormal results are displayed) Labs Reviewed  BASIC  METABOLIC PANEL - Abnormal; Notable for the following components:      Result Value   Sodium 134 (*)    Calcium 8.5 (*)    GFR, Estimated 60 (*)    All other components within normal limits  CBC WITH DIFFERENTIAL/PLATELET - Abnormal; Notable for the following components:   Hemoglobin 10.8 (*)    HCT 35.1 (*)    MCH 24.7 (*)    RDW 18.5 (*)    All other components within normal limits     EKG Interpreted by me Sinus rhythm rate of 81.  Left axis, prolonged QTc, chronic compared to prior EKG December 03, 2022. No ischemic changes   RADIOLOGY    PROCEDURES:  Procedures   MEDICATIONS ORDERED IN ED: Medications  acetaminophen (TYLENOL) tablet 1,000 mg (1,000 mg Oral Given 08/03/23 0058)     IMPRESSION / MDM / ASSESSMENT AND PLAN / ED COURSE  I reviewed the triage vital signs and the nursing notes.  DDx: Dehydration, electrolyte derangement, AKI, anemia, intracranial hemorrhage, C-spine fracture  Patient's presentation is most consistent with acute presentation with potential threat to life or bodily function.  Patient sent to the ED due to a suspected fall which was unwitnessed.  Patient denies falling and denies any complaints, has a reassuring exam without any signs of trauma.  Vital signs are unremarkable and he is very calm and comfortable.  He is oriented x 2, which I suspect is baseline for this patient with known dementia.  Per report he is currently being treated for UTI with chronic urinary catheter.  Abdomen is nontender, he is afebrile, urine is clear.  Urinalysis would not be fruitful in the current circumstances.  Will check labs and CT head/C-spine    Clinical Course as of 08/03/23 0329  Tue Aug 03, 2023  0328 Labs and CT unremarkable. Stable for DC.  [PS]    Clinical Course User Index [PS] Sharman Cheek, MD     FINAL CLINICAL IMPRESSION(S) / ED DIAGNOSES   Final diagnoses:  Chronic dementia (HCC)     Rx / DC Orders   ED Discharge Orders     None         Note:  This document was prepared using Dragon voice recognition software and may include unintentional dictation errors.   Sharman Cheek, MD 08/03/23 [phone]

## 2023-08-03 NOTE — ED Notes (Addendum)
 [facility] notified of approximate ETA for transfer/pt departure from this facility.

## 2023-08-03 NOTE — ED Notes (Addendum)
 Upon settling patient, patient endorses 10/10 splitting HA, pt did not endorse pain during triage, MD notified.

## 2023-08-03 NOTE — Discharge Instructions (Signed)
Your lab tests and CT scan of the head were all okay today.

## 2023-08-03 NOTE — ED Notes (Signed)
 Called [facility] @ [phone] to arrange transport back to [facility] spoke to Pam/ Patient put on list for 0930 pickup.

## 2023-08-03 NOTE — ED Triage Notes (Signed)
 Here for unwitnessed fall, unknown head strike/LOC, no thinners. Per facility concerned patient is more confused. Pt A+Ox2 at this time, disoriented to time and situation, per EMS baseline A+Ox3. Pt denies pain at this time.

## 2023-08-31 ENCOUNTER — Emergency Department: Payer: Medicare (Managed Care)

## 2023-08-31 ENCOUNTER — Inpatient Hospital Stay
Admission: EM | Admit: 2023-08-31 | Discharge: 2023-09-06 | DRG: 389 | Disposition: A | Discharge status: Other Acute Inpatient Hospital | Payer: Medicare (Managed Care) | Source: Skilled Nursing Facility | Attending: Family Medicine | Admitting: Family Medicine

## 2023-08-31 ENCOUNTER — Encounter: Payer: Self-pay | Admitting: Emergency Medicine

## 2023-08-31 ENCOUNTER — Other Ambulatory Visit: Payer: Self-pay

## 2023-08-31 DIAGNOSIS — F0394 Unspecified dementia, unspecified severity, with anxiety: Secondary | ICD-10-CM | POA: Diagnosis present

## 2023-08-31 DIAGNOSIS — I10 Essential (primary) hypertension: Secondary | ICD-10-CM | POA: Diagnosis not present

## 2023-08-31 DIAGNOSIS — Z9841 Cataract extraction status, right eye: Secondary | ICD-10-CM

## 2023-08-31 DIAGNOSIS — N390 Urinary tract infection, site not specified: Secondary | ICD-10-CM | POA: Diagnosis not present

## 2023-08-31 DIAGNOSIS — Z85528 Personal history of other malignant neoplasm of kidney: Secondary | ICD-10-CM

## 2023-08-31 DIAGNOSIS — N401 Enlarged prostate with lower urinary tract symptoms: Secondary | ICD-10-CM | POA: Diagnosis present

## 2023-08-31 DIAGNOSIS — Z6826 Body mass index (BMI) 26.0-26.9, adult: Secondary | ICD-10-CM

## 2023-08-31 DIAGNOSIS — Z8673 Personal history of transient ischemic attack (TIA), and cerebral infarction without residual deficits: Secondary | ICD-10-CM

## 2023-08-31 DIAGNOSIS — N319 Neuromuscular dysfunction of bladder, unspecified: Secondary | ICD-10-CM

## 2023-08-31 DIAGNOSIS — K5981 Ogilvie syndrome: Secondary | ICD-10-CM | POA: Diagnosis present

## 2023-08-31 DIAGNOSIS — E663 Overweight: Secondary | ICD-10-CM

## 2023-08-31 DIAGNOSIS — K567 Ileus, unspecified: Secondary | ICD-10-CM | POA: Diagnosis not present

## 2023-08-31 DIAGNOSIS — Z781 Physical restraint status: Secondary | ICD-10-CM

## 2023-08-31 DIAGNOSIS — Z885 Allergy status to narcotic agent status: Secondary | ICD-10-CM

## 2023-08-31 DIAGNOSIS — Y732 Prosthetic and other implants, materials and accessory gastroenterology and urology devices associated with adverse incidents: Secondary | ICD-10-CM | POA: Diagnosis present

## 2023-08-31 DIAGNOSIS — I4891 Unspecified atrial fibrillation: Secondary | ICD-10-CM | POA: Diagnosis present

## 2023-08-31 DIAGNOSIS — F0393 Unspecified dementia, unspecified severity, with mood disturbance: Secondary | ICD-10-CM | POA: Diagnosis present

## 2023-08-31 DIAGNOSIS — E785 Hyperlipidemia, unspecified: Secondary | ICD-10-CM

## 2023-08-31 DIAGNOSIS — Z96642 Presence of left artificial hip joint: Secondary | ICD-10-CM | POA: Diagnosis present

## 2023-08-31 DIAGNOSIS — K529 Noninfective gastroenteritis and colitis, unspecified: Principal | ICD-10-CM

## 2023-08-31 DIAGNOSIS — Z87891 Personal history of nicotine dependence: Secondary | ICD-10-CM

## 2023-08-31 DIAGNOSIS — Z981 Arthrodesis status: Secondary | ICD-10-CM

## 2023-08-31 DIAGNOSIS — Q438 Other specified congenital malformations of intestine: Secondary | ICD-10-CM

## 2023-08-31 DIAGNOSIS — D500 Iron deficiency anemia secondary to blood loss (chronic): Secondary | ICD-10-CM | POA: Diagnosis not present

## 2023-08-31 DIAGNOSIS — Z905 Acquired absence of kidney: Secondary | ICD-10-CM

## 2023-08-31 DIAGNOSIS — B964 Proteus (mirabilis) (morganii) as the cause of diseases classified elsewhere: Secondary | ICD-10-CM | POA: Diagnosis not present

## 2023-08-31 DIAGNOSIS — K219 Gastro-esophageal reflux disease without esophagitis: Secondary | ICD-10-CM | POA: Diagnosis present

## 2023-08-31 DIAGNOSIS — E876 Hypokalemia: Secondary | ICD-10-CM

## 2023-08-31 DIAGNOSIS — Z888 Allergy status to other drugs, medicaments and biological substances status: Secondary | ICD-10-CM

## 2023-08-31 DIAGNOSIS — I5032 Chronic diastolic (congestive) heart failure: Secondary | ICD-10-CM | POA: Diagnosis not present

## 2023-08-31 DIAGNOSIS — T83010A Breakdown (mechanical) of cystostomy catheter, initial encounter: Secondary | ICD-10-CM | POA: Diagnosis present

## 2023-08-31 DIAGNOSIS — Z961 Presence of intraocular lens: Secondary | ICD-10-CM | POA: Diagnosis present

## 2023-08-31 DIAGNOSIS — F32A Depression, unspecified: Secondary | ICD-10-CM

## 2023-08-31 DIAGNOSIS — Z79899 Other long term (current) drug therapy: Secondary | ICD-10-CM

## 2023-08-31 DIAGNOSIS — I700 Atherosclerosis of aorta: Secondary | ICD-10-CM | POA: Diagnosis present

## 2023-08-31 DIAGNOSIS — I110 Hypertensive heart disease with heart failure: Secondary | ICD-10-CM | POA: Diagnosis present

## 2023-08-31 DIAGNOSIS — F03911 Unspecified dementia, unspecified severity, with agitation: Secondary | ICD-10-CM | POA: Diagnosis present

## 2023-08-31 DIAGNOSIS — Z9842 Cataract extraction status, left eye: Secondary | ICD-10-CM

## 2023-08-31 LAB — URINALYSIS, ROUTINE W REFLEX MICROSCOPIC
Bacteria, UA: NONE SEEN
Bilirubin Urine: NEGATIVE
Glucose, UA: NEGATIVE mg/dL
Hgb urine dipstick: NEGATIVE
Ketones, ur: NEGATIVE mg/dL
Nitrite: NEGATIVE
Protein, ur: 30 mg/dL — AB
Specific Gravity, Urine: >1.046 — ABNORMAL HIGH (ref 1.005–1.030)
pH: 7 (ref 5.0–8.0)

## 2023-08-31 LAB — COMPREHENSIVE METABOLIC PANEL WITH GFR
ALT: 12 U/L (ref 0–44)
AST: 17 U/L (ref 15–41)
Albumin: 3.2 g/dL — ABNORMAL LOW (ref 3.5–5.0)
Alkaline Phosphatase: 79 U/L (ref 38–126)
Anion gap: 10 (ref 5–15)
BUN: 21 mg/dL (ref 8–23)
CO2: 24 mmol/L (ref 22–32)
Calcium: 8.7 mg/dL — ABNORMAL LOW (ref 8.9–10.3)
Chloride: 104 mmol/L (ref 98–111)
Creatinine, Ser: 0.89 mg/dL (ref 0.61–1.24)
GFR, Estimated: >60 mL/min (ref >60)
Glucose, Bld: 122 mg/dL — ABNORMAL HIGH (ref 70–99)
Potassium: 3.3 mmol/L — ABNORMAL LOW (ref 3.5–5.1)
Sodium: 138 mmol/L (ref 135–145)
Total Bilirubin: 0.6 mg/dL (ref 0.0–1.2)
Total Protein: 6.7 g/dL (ref 6.5–8.1)

## 2023-08-31 LAB — LIPASE, BLOOD: Lipase: 26 U/L (ref 11–51)

## 2023-08-31 LAB — CBC
HCT: 38.9 % — ABNORMAL LOW (ref 39.0–52.0)
Hemoglobin: 11.9 g/dL — ABNORMAL LOW (ref 13.0–17.0)
MCH: 24.8 pg — ABNORMAL LOW (ref 26.0–34.0)
MCHC: 30.6 g/dL (ref 30.0–36.0)
MCV: 81.2 fL (ref 80.0–100.0)
Platelets: 214 10*3/uL (ref 150–400)
RBC: 4.79 MIL/uL (ref 4.22–5.81)
RDW: 18.3 % — ABNORMAL HIGH (ref 11.5–15.5)
WBC: 8.9 10*3/uL (ref 4.0–10.5)
nRBC: 0 % (ref 0.0–0.2)

## 2023-08-31 LAB — MAGNESIUM: Magnesium: 2.3 mg/dL (ref 1.7–2.4)

## 2023-08-31 LAB — BRAIN NATRIURETIC PEPTIDE: B Natriuretic Peptide: 235.4 pg/mL — ABNORMAL HIGH (ref 0.0–100.0)

## 2023-08-31 LAB — LACTIC ACID, PLASMA
Lactic Acid, Venous: 2.5 mmol/L (ref 0.5–1.9)
Lactic Acid, Venous: 1.7 mmol/L (ref 0.5–1.9)

## 2023-08-31 MED ORDER — SODIUM CHLORIDE 0.9 % IV BOLUS
1000.0000 mL | Freq: Once | INTRAVENOUS | Status: AC
  Administered 2023-08-31: 1000 mL via INTRAVENOUS

## 2023-08-31 MED ORDER — MORPHINE SULFATE (PF) 2 MG/ML IV SOLN
1.0000 mg | INTRAVENOUS | Status: DC | PRN
  Administered 2023-09-01: 1 mg via INTRAVENOUS
  Filled 2023-08-31: qty 1

## 2023-08-31 MED ORDER — IOHEXOL 300 MG/ML  SOLN
100.0000 mL | Freq: Once | INTRAMUSCULAR | Status: AC | PRN
  Administered 2023-08-31: 100 mL via INTRAVENOUS

## 2023-08-31 MED ORDER — PIPERACILLIN-TAZOBACTAM 3.375 G IVPB
3.3750 g | Freq: Once | INTRAVENOUS | Status: AC
  Administered 2023-08-31: 3.375 g via INTRAVENOUS
  Filled 2023-08-31: qty 50

## 2023-08-31 MED ORDER — ONDANSETRON HCL 4 MG/2ML IJ SOLN
4.0000 mg | Freq: Three times a day (TID) | INTRAMUSCULAR | Status: DC | PRN
Start: 2023-08-31 — End: 2023-09-06

## 2023-08-31 MED ORDER — ACETAMINOPHEN 325 MG PO TABS
650.0000 mg | ORAL_TABLET | Freq: Four times a day (QID) | ORAL | Status: DC | PRN
  Filled 2023-08-31: qty 2

## 2023-08-31 MED ORDER — POTASSIUM CHLORIDE CRYS ER 20 MEQ PO TBCR
40.0000 meq | EXTENDED_RELEASE_TABLET | Freq: Once | ORAL | Status: DC
  Filled 2023-08-31: qty 2

## 2023-08-31 MED ORDER — HEPARIN SODIUM (PORCINE) 5000 UNIT/ML IJ SOLN
5000.0000 [IU] | Freq: Three times a day (TID) | INTRAMUSCULAR | Status: DC
  Administered 2023-08-31 – 2023-09-06 (×16): 5000 [IU] via SUBCUTANEOUS
  Filled 2023-08-31 (×18): qty 1

## 2023-08-31 MED ORDER — SODIUM CHLORIDE 0.9 % IV SOLN: INTRAVENOUS | Status: AC

## 2023-08-31 MED ORDER — HYDRALAZINE HCL 20 MG/ML IJ SOLN: 5.0000 mg | INTRAMUSCULAR | Status: DC | PRN

## 2023-08-31 NOTE — ED Provider Notes (Signed)
 [facility] Provider Note    Event Date/Time   First MD Initiated Contact with Patient 08/31/23 1631     (approximate)   History   Abdominal Pain   HPI  Adam Rios. is a 83 y.o. male with a history of CHF, BPH, hypertension, dyslipidemia, CVA, and GERD who presents with abdominal pain since last night, persistent course, periumbilical location, and associated with nausea but no vomiting or diarrhea.  He states that his abdomen seems somewhat swollen to him.  He denies any acute urinary symptoms.  I reviewed the past medical records per the patient was most recent admitted to the hospitalist service in January with a suprapubic catheter dysfunction and acute urinary retention.  The catheter was replaced by IR without complication.   Physical Exam   Triage Vital Signs: ED Triage Vitals  Encounter Vitals Group     BP 08/31/23 1507 116/75     Systolic BP Percentile --      Diastolic BP Percentile --      Pulse Rate 08/31/23 1507 71     Resp 08/31/23 1507 17     Temp 08/31/23 1507 98.1 F (36.7 C)     Temp Source 08/31/23 1507 Oral     SpO2 08/31/23 1507 95 %     Weight 08/31/23 1508 200 lb (90.7 kg)     Height 08/31/23 1508 6\' 1"  (1.854 m)     Head Circumference --      Peak Flow --      Pain Score 08/31/23 1508 8     Pain Loc --      Pain Education --      Exclude from Growth Chart --     Most recent vital signs: Vitals:   08/31/23 2100 08/31/23 2328  BP: 118/65   Pulse: 78 (!) 59  Resp: 13 14  Temp:  97.7 F (36.5 C)  SpO2: 94% 96%     General: Awake, no distress.  CV:  Good peripheral perfusion.  Resp:  Normal effort.  Abd:  Abdomen appears slightly distended.  There is mild diffuse tenderness, slightly worse around the umbilicus. Other:  No jaundice or scleral icterus.   ED Results / Procedures / Treatments   Labs (all labs ordered are listed, but only abnormal results are displayed) Labs Reviewed  COMPREHENSIVE  METABOLIC PANEL WITH GFR - Abnormal; Notable for the following components:      Result Value   Potassium 3.3 (*)    Glucose, Bld 122 (*)    Calcium 8.7 (*)    Albumin 3.2 (*)    All other components within normal limits  CBC - Abnormal; Notable for the following components:   Hemoglobin 11.9 (*)    HCT 38.9 (*)    MCH 24.8 (*)    RDW 18.3 (*)    All other components within normal limits  URINALYSIS, ROUTINE W REFLEX MICROSCOPIC - Abnormal; Notable for the following components:   Color, Urine YELLOW (*)    APPearance TURBID (*)    Specific Gravity, Urine >1.046 (*)    Protein, ur 30 (*)    Leukocytes,Ua SMALL (*)    All other components within normal limits  LACTIC ACID, PLASMA - Abnormal; Notable for the following components:   Lactic Acid, Venous 2.5 (*)    All other components within normal limits  BRAIN NATRIURETIC PEPTIDE - Abnormal; Notable for the following components:   B Natriuretic Peptide 235.4 (*)  All other components within normal limits  CULTURE, BLOOD (ROUTINE X 2)  CULTURE, BLOOD (ROUTINE X 2)  URINE CULTURE  LIPASE, BLOOD  LACTIC ACID, PLASMA  MAGNESIUM  BASIC METABOLIC PANEL WITH GFR  CBC     EKG     RADIOLOGY  CT abdomen/pelvis: I independently viewed and interpreted the images; there are no dilated bowel loops or any free air or free fluid.  Radiology report indicates the following:  IMPRESSION:  1. Gaseous dilation of the redundant sigmoid colon measuring up to  7.8 cm in diameter. There is tapering to the sigmoid colon where  there is mild wall thickening. Findings favor ileus secondary to  proctocolitis.  2. Suprapubic catheter in the nondistended bladder.  3. Aortic Atherosclerosis (ICD10-I70.0).    PROCEDURES:  Critical Care performed: No  Procedures   MEDICATIONS ORDERED IN ED: Medications  0.9 %  sodium chloride infusion (has no administration in time range)  potassium chloride SA (KLOR-CON M) CR tablet 40 mEq (40 mEq Oral  Not Given 08/31/23 2334)  ondansetron (ZOFRAN) injection 4 mg (has no administration in time range)  hydrALAZINE (APRESOLINE) injection 5 mg (has no administration in time range)  acetaminophen (TYLENOL) tablet 650 mg (has no administration in time range)  heparin injection 5,000 Units (5,000 Units Subcutaneous Given 08/31/23 2336)  morphine (PF) 2 MG/ML injection 1 mg (has no administration in time range)  iohexol (OMNIPAQUE) 300 MG/ML solution 100 mL (100 mLs Intravenous Contrast Given 08/31/23 1810)  sodium chloride 0.9 % bolus 1,000 mL (0 mLs Intravenous Stopped 08/31/23 2004)  piperacillin-tazobactam (ZOSYN) IVPB 3.375 g (0 g Intravenous Stopped 08/31/23 2249)     IMPRESSION / MDM / ASSESSMENT AND PLAN / ED COURSE  I reviewed the triage vital signs and the nursing notes.  83 year old male with PMH as noted above presents with periumbilical abdominal pain associated with nausea but no vomiting or diarrhea.  On exam the abdomen appears mildly distended with some tenderness throughout.  Differential diagnosis includes, but is not limited to, colitis, diverticulitis, gastroenteritis, gastritis, PUD, pancreatitis, other hepatobiliary cause, SBO, volvulus.  CBC shows no leukocytosis.  CMP shows normal LFTs.  Lipase is normal.  We will obtain a CT for further evaluation.  Patient's presentation is most consistent with acute presentation with potential threat to life or bodily function.  The patient is on the cardiac monitor to evaluate for evidence of arrhythmia and/or significant heart rate changes.  ----------------------------------------- 10:04 PM on 08/31/2023 -----------------------------------------  CT shows evidence of ileus secondary to proctocolitis.  I have ordered IV Zosyn.  Given his age, comorbidities, and the severity of his symptoms, the patient will need admission for further management.  I consulted Dr. Clyde Lundborg from the hospitalist service; based on our discussion he agrees to evaluate  the patient for admission.  FINAL CLINICAL IMPRESSION(S) / ED DIAGNOSES   Final diagnoses:  Proctocolitis     Rx / DC Orders   ED Discharge Orders     None        Note:  This document was prepared using Dragon voice recognition software and may include unintentional dictation errors.    Dionne Bucy, MD 08/31/23 [phone]

## 2023-08-31 NOTE — ED Triage Notes (Addendum)
 Patient to ED via [facility] from [facility] services for abd pain. States having nausea- denies V/D. Pain ongoing x2 days. Unsure of last BM.

## 2023-08-31 NOTE — ED Notes (Signed)
 Foley bag changed for clean urine sample

## 2023-08-31 NOTE — ED Triage Notes (Signed)
 First nurse note: Arrived by [facility] from [facility]. C/o distention in abdomen. MD would like scan to check for ileus.   Denies N/V/D

## 2023-08-31 NOTE — ED Notes (Signed)
 Pt is conscious and alert to his normal with a hx of dementia. Pt yells out "don't touch me" when touched to move from bed to bed. Pt successfully transferred to inpatient bed and made comfortable. Pt refused to talk to answer any questions for assessment.

## 2023-08-31 NOTE — ED Notes (Signed)
 CCMD notified of pt move

## 2023-08-31 NOTE — ED Notes (Signed)
 Cleaned patient of clear mucus stool

## 2023-08-31 NOTE — ED Notes (Addendum)
 Pt refused to open his mouth for oral medications. When trying to administer heparin subcutaneus, pt swatted at me and yelled "stop the movie". He did eventually let me administer the medication, however was resistant.

## 2023-08-31 NOTE — ED Provider Triage Note (Signed)
 Emergency Medicine Provider Triage Evaluation Note  Adam Bala. , a 83 y.o. male  was evaluated in triage.  Pt complains of abdominal pain. Sent over for concern of an ileus.   Review of Systems  Positive: Nausea, vomiting and abdominal pain Negative: fever  Physical Exam  BP 116/75   Pulse 71   Temp 98.1 F (36.7 C) (Oral)   Resp 17   Ht 6\' 1"  (1.854 m)   Wt 90.7 kg   SpO2 95%   BMI 26.39 kg/m  Gen:   Awake, no distress   Resp:  Normal effort  MSK:   Moves extremities without difficulty  Other:    Medical Decision Making  Medically screening exam initiated at 3:13 PM.  Appropriate orders placed.  Adam Rios. was informed that the remainder of the evaluation will be completed by another provider, this initial triage assessment does not replace that evaluation, and the importance of remaining in the ED until their evaluation is complete.    Cameron Ali, PA-C 08/31/23 1514

## 2023-08-31 NOTE — H&P (Signed)
 History and Physical    Adam Rios. YQM:[id number] DOB: 11-Mar-1941 DOA: 08/31/2023  Referring MD/NP/PA:   PCP: Laqueta Due, MD   Patient coming from:  The patient is coming from home.     Chief Complaint: Abdominal pain  HPI: Adam Rios. is a 83 y.o. male with medical history significant of      Data reviewed independently and ED Course: pt was found to have     ***       EKG: I have personally reviewed.  Not done in ED, will get one.   ***   Review of Systems:   General: no fevers, chills, no body weight gain, has poor appetite, has fatigue HEENT: no blurry vision, hearing changes or sore throat Respiratory: no dyspnea, coughing, wheezing CV: no chest pain, no palpitations GI: no nausea, vomiting, abdominal pain, diarrhea, constipation GU: no dysuria, burning on urination, increased urinary frequency, hematuria  Ext: no leg edema Neuro: no unilateral weakness, numbness, or tingling, no vision change or hearing loss Skin: no rash, no skin tear. MSK: No muscle spasm, no deformity, no limitation of range of movement in spin Heme: No easy bruising.  Travel history: No recent long distant travel.   Allergy:  Allergies  Allergen Reactions   Empagliflozin     Recurrent UTI with sepsis on jardiance.   Fentanyl Nausea Only   Oxycodone Nausea And Vomiting    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HCC)    Hypertension    Neurogenic bladder    Renal cell carcinoma (HCC) 2005   Renal disorder    Stroke (HCC)     01/17/18, 12/21   Some weakness and cognitive decline    Past Surgical History:  Procedure Laterality Date   APPENDECTOMY     CATARACT EXTRACTION W/PHACO Right 07/22/2020   Procedure: CATARACT EXTRACTION PHACO AND INTRAOCULAR LENS PLACEMENT (IOC) RIGHT;  Surgeon: Nevada Crane, MD;  Location: [facility];   Service: Ophthalmology;  Laterality: Right;  4.77 0:44.2   CATARACT EXTRACTION W/PHACO Left 08/05/2020   Procedure: CATARACT EXTRACTION PHACO AND INTRAOCULAR LENS PLACEMENT (IOC) LEFT 10.73 01:13.8;  Surgeon: Nevada Crane, MD;  Location: [facility];  Service: Ophthalmology;  Laterality: Left;  Use eye stretcher not chair   HERNIA REPAIR     HIP ARTHROPLASTY Left 11/28/2022   Procedure: ARTHROPLASTY BIPOLAR HIP (HEMIARTHROPLASTY);  Surgeon: Signa Kell, MD;  Location: [facility] ORS;  Service: Orthopedics;  Laterality: Left;   IR CATHETER TUBE CHANGE  06/15/2023   LUMBAR FUSION     NEPHRECTOMY Left 2005   TEMPORARY PACEMAKER N/A 05/03/2022   Procedure: TEMPORARY PACEMAKER;  Surgeon: Alwyn Pea, MD;  Location: [facility] INVASIVE CV LAB;  Service: Cardiovascular;  Laterality: N/A;    Social History:  reports that he quit smoking about 20 years ago. His smoking use included cigarettes. He started smoking about 30 years ago. He has a 2.5 pack-year smoking history. He has never used smokeless tobacco. He reports that he does not currently use alcohol. He reports that he does not use drugs.  Family History:  Family History  Problem Relation Age of Onset   Cerebral aneurysm Father    Cerebral aneurysm Sister      Prior to Admission medications   Medication Sig Start Date End Date Taking? Authorizing Provider  Acetaminophen Extra Strength 500  MG TABS Take 1,000 mg by mouth 3 (three) times daily as needed (PAIN).    [provider]  atorvastatin (LIPITOR) 40 MG tablet Take 40 mg by mouth daily.    [provider]  CALPROTECT 0.44-20.6 % OINT Apply 1 Application topically 2 (two) times daily. 06/03/23   [provider]  docusate sodium (COLACE) 100 MG capsule Take 1 capsule (100 mg total) by mouth 2 (two) times daily. 12/04/22   Delfino Lovett, MD  finasteride (PROSCAR) 5 MG tablet Take 5 mg by mouth daily. 04/27/22   [provider]  gabapentin (NEURONTIN)  300 MG capsule Take 2 capsules (600 mg total) by mouth 2 (two) times daily. 06/16/23   Alford Highland, MD  hydrocortisone cream ([employer] CORTISONE MAXIMUM STRENGTH) 1 % Apply 1 Application topically as needed for itching.    [provider]  magnesium oxide (MAG-OX) 400 (240 Mg) MG tablet Take 2 tablets by mouth 2 (two) times daily. 06/02/23   [provider]  metoCLOPramide (REGLAN) 5 MG tablet Take 1-2 tablets (5-10 mg total) by mouth every 8 (eight) hours as needed for nausea (if ondansetron (ZOFRAN) ineffective.). 12/04/22   Delfino Lovett, MD  metoprolol succinate (TOPROL-XL) 25 MG 24 hr tablet Take 0.5 tablets (12.5 mg total) by mouth daily. 06/16/23   Alford Highland, MD  MYRBETRIQ 25 MG TB24 tablet Take 25 mg by mouth daily. 06/02/23   [provider]  ondansetron (ZOFRAN) 4 MG tablet Take 1 tablet (4 mg total) by mouth every 6 (six) hours as needed for nausea. 12/04/22   Delfino Lovett, MD  pantoprazole (PROTONIX) 20 MG tablet Take 1 tablet (20 mg total) by mouth daily. 10/14/22 06/14/23  Gillis Santa, MD  POLY-IRON 150 FORTE 150-25-1 MG-MCG-MG CAPS Take 1 capsule by mouth daily. 06/02/23   [provider]  Psyllium Fiber 0.52 g CAPS Take 1 capsule by mouth 2 (two) times daily. 06/10/23   [provider]  senna-docusate (SENOKOT-S) 8.6-50 MG tablet Take 1 tablet by mouth 2 (two) times daily as needed. 08/15/22   Alford Highland, MD  sertraline (ZOLOFT) 50 MG tablet Take 50 mg by mouth daily. 06/02/23   [provider]  solifenacin (VESICARE) 5 MG tablet Take 5 mg by mouth daily.    [provider]  spironolactone (ALDACTONE) 25 MG tablet Take 1 tablet by mouth daily. 11/05/22 11/05/23  [provider]  tamsulosin (FLOMAX) 0.4 MG CAPS capsule Take 1 capsule by mouth daily. 10/30/22   [provider]  torsemide 40 MG TABS Take 40 mg by mouth daily. 12/05/22   Delfino Lovett, MD  traMADol (ULTRAM) 50 MG tablet Take 1 tablet (50 mg total) by mouth  every 6 (six) hours as needed for moderate pain (pain score 4-6). 06/16/23   Alford Highland, MD  vitamin B-12 (CYANOCOBALAMIN) 1000 MCG tablet Take 1,000 mcg by mouth daily.    [provider]  Vitamin D, Ergocalciferol, (DRISDOL) 1.25 MG (50000 UNIT) CAPS capsule Take 50,000 Units by mouth every 7 (seven) days. 06/02/23   [provider]    Physical Exam: Vitals:   08/31/23 1900 08/31/23 2000 08/31/23 2030 08/31/23 2100  BP: 126/73 124/74 123/88 118/65  Pulse: 66 78 83 78  Resp: 14 14 12 13   Temp:      TempSrc:      SpO2: 97% 94% 95% 94%  Weight:      Height:       General: Not in acute distress HEENT:  Eyes: PERRL, EOMI, no jaundice       ENT: No discharge from the ears and nose, no pharynx injection, no tonsillar enlargement.        Neck: No JVD, no bruit, no mass felt. Heme: No neck lymph node enlargement. Cardiac: S1/S2, RRR, No murmurs, No gallops or rubs. Respiratory: No rales, wheezing, rhonchi or rubs. GI: Soft, nondistended, nontender, no rebound pain, no organomegaly, BS present. GU: No hematuria Ext: No pitting leg edema bilaterally. 1+DP/PT pulse bilaterally. Musculoskeletal: No joint deformities, No joint redness or warmth, no limitation of ROM in spin. Skin: No rashes.  Neuro: Alert, oriented X3, cranial nerves II-XII grossly intact, moves all extremities normally. Muscle strength 5/5 in all extremities, sensation to light touch intact. Brachial reflex 2+ bilaterally. Knee reflex 1+ bilaterally. Negative Babinski's sign. Normal finger to nose test. Psych: Patient is not psychotic, no suicidal or hemocidal ideation.  Labs on Admission: I have personally reviewed following labs and imaging studies  CBC: Recent Labs  Lab 08/31/23 1510  WBC 8.9  HGB 11.9*  HCT 38.9*  MCV 81.2  PLT 214   Basic Metabolic Panel: Recent Labs  Lab 08/31/23 1510  NA 138  K 3.3*  CL 104  CO2 24  GLUCOSE 122*  BUN 21  CREATININE 0.89  CALCIUM 8.7*    GFR: Estimated Creatinine Clearance: 71.1 mL/min (by C-G formula based on SCr of 0.89 mg/dL). Liver Function Tests: Recent Labs  Lab 08/31/23 1510  AST 17  ALT 12  ALKPHOS 79  BILITOT 0.6  PROT 6.7  ALBUMIN 3.2*   Recent Labs  Lab 08/31/23 1510  LIPASE 26   No results for input(s): "AMMONIA" in the last 168 hours. Coagulation Profile: No results for input(s): "INR", "PROTIME" in the last 168 hours. Cardiac Enzymes: No results for input(s): "CKTOTAL", "CKMB", "CKMBINDEX", "TROPONINI" in the last 168 hours. BNP (last 3 results) No results for input(s): "PROBNP" in the last 8760 hours. HbA1C: No results for input(s): "HGBA1C" in the last 72 hours. CBG: No results for input(s): "GLUCAP" in the last 168 hours. Lipid Profile: No results for input(s): "CHOL", "HDL", "LDLCALC", "TRIG", "CHOLHDL", "LDLDIRECT" in the last 72 hours. Thyroid Function Tests: No results for input(s): "TSH", "T4TOTAL", "FREET4", "T3FREE", "THYROIDAB" in the last 72 hours. Anemia Panel: No results for input(s): "VITAMINB12", "FOLATE", "FERRITIN", "TIBC", "IRON", "RETICCTPCT" in the last 72 hours. Urine analysis:    Component Value Date/Time   COLORURINE YELLOW (A) 08/31/2023 1510   APPEARANCEUR TURBID (A) 08/31/2023 1510   LABSPEC >1.046 (H) 08/31/2023 1510   PHURINE 7.0 08/31/2023 1510   GLUCOSEU NEGATIVE 08/31/2023 1510   HGBUR NEGATIVE 08/31/2023 1510   BILIRUBINUR NEGATIVE 08/31/2023 1510   KETONESUR NEGATIVE 08/31/2023 1510   PROTEINUR 30 (A) 08/31/2023 1510   NITRITE NEGATIVE 08/31/2023 1510   LEUKOCYTESUR SMALL (A) 08/31/2023 1510   Sepsis Labs: @LABRCNTIP (procalcitonin:4,lacticidven:4) )No results found for this or any previous visit (from the past 240 hours).   Radiological Exams on Admission:   Assessment/Plan Active Problems:   * No active hospital problems. *   Assessment and Plan: No notes have been filed under this hospital service. Service:  Hospitalist      Active Problems:   * No active hospital problems. *    DVT ppx: SQ Heparin         SQ Lovenox  Code Status: Full code   ***  Family Communication:     not done, no family member is at bed side.  Yes, patient's    at bed side.       by phone   ***  Disposition Plan:  Anticipate discharge back to previous environment  Consults called:    Admission status and Level of care: :    for obs as inpt        Dispo: The patient is from: {From:23814}              Anticipated d/c is to: {To:23815}              Anticipated d/c date is: {Days:23816}              Patient currently {Medically stable:23817}    Severity of Illness:  {Observation/Inpatient:21159}       Date of Service 08/31/2023    Lorretta Harp [facility]   If 7PM-7AM, please contact night-coverage [email or web address] 08/31/2023, 9:51 PM

## 2023-08-31 NOTE — ED Notes (Signed)
 Blood cultures obtained. Only able to get a blue top for each set.

## 2023-09-01 ENCOUNTER — Inpatient Hospital Stay: Payer: Medicare (Managed Care)

## 2023-09-01 ENCOUNTER — Observation Stay: Payer: Medicare (Managed Care)

## 2023-09-01 DIAGNOSIS — Z96642 Presence of left artificial hip joint: Secondary | ICD-10-CM | POA: Diagnosis present

## 2023-09-01 DIAGNOSIS — Z8673 Personal history of transient ischemic attack (TIA), and cerebral infarction without residual deficits: Secondary | ICD-10-CM | POA: Diagnosis not present

## 2023-09-01 DIAGNOSIS — N390 Urinary tract infection, site not specified: Secondary | ICD-10-CM | POA: Diagnosis not present

## 2023-09-01 DIAGNOSIS — N401 Enlarged prostate with lower urinary tract symptoms: Secondary | ICD-10-CM | POA: Diagnosis present

## 2023-09-01 DIAGNOSIS — K567 Ileus, unspecified: Secondary | ICD-10-CM | POA: Diagnosis present

## 2023-09-01 DIAGNOSIS — E876 Hypokalemia: Secondary | ICD-10-CM | POA: Diagnosis present

## 2023-09-01 DIAGNOSIS — F03911 Unspecified dementia, unspecified severity, with agitation: Secondary | ICD-10-CM | POA: Diagnosis present

## 2023-09-01 DIAGNOSIS — T83010A Breakdown (mechanical) of cystostomy catheter, initial encounter: Secondary | ICD-10-CM | POA: Diagnosis present

## 2023-09-01 DIAGNOSIS — Z781 Physical restraint status: Secondary | ICD-10-CM | POA: Diagnosis not present

## 2023-09-01 DIAGNOSIS — I4891 Unspecified atrial fibrillation: Secondary | ICD-10-CM | POA: Diagnosis present

## 2023-09-01 DIAGNOSIS — Q438 Other specified congenital malformations of intestine: Secondary | ICD-10-CM | POA: Diagnosis not present

## 2023-09-01 DIAGNOSIS — E785 Hyperlipidemia, unspecified: Secondary | ICD-10-CM | POA: Diagnosis present

## 2023-09-01 DIAGNOSIS — F32A Depression, unspecified: Secondary | ICD-10-CM | POA: Diagnosis present

## 2023-09-01 DIAGNOSIS — Y732 Prosthetic and other implants, materials and accessory gastroenterology and urology devices associated with adverse incidents: Secondary | ICD-10-CM | POA: Diagnosis present

## 2023-09-01 DIAGNOSIS — I110 Hypertensive heart disease with heart failure: Secondary | ICD-10-CM | POA: Diagnosis present

## 2023-09-01 DIAGNOSIS — F0394 Unspecified dementia, unspecified severity, with anxiety: Secondary | ICD-10-CM | POA: Diagnosis present

## 2023-09-01 DIAGNOSIS — I5032 Chronic diastolic (congestive) heart failure: Secondary | ICD-10-CM | POA: Diagnosis present

## 2023-09-01 DIAGNOSIS — E663 Overweight: Secondary | ICD-10-CM | POA: Diagnosis present

## 2023-09-01 DIAGNOSIS — K529 Noninfective gastroenteritis and colitis, unspecified: Secondary | ICD-10-CM | POA: Diagnosis present

## 2023-09-01 DIAGNOSIS — F0393 Unspecified dementia, unspecified severity, with mood disturbance: Secondary | ICD-10-CM | POA: Diagnosis present

## 2023-09-01 DIAGNOSIS — I700 Atherosclerosis of aorta: Secondary | ICD-10-CM | POA: Diagnosis present

## 2023-09-01 DIAGNOSIS — T83098A Other mechanical complication of other indwelling urethral catheter, initial encounter: Secondary | ICD-10-CM | POA: Diagnosis not present

## 2023-09-01 DIAGNOSIS — K5939 Other megacolon: Secondary | ICD-10-CM | POA: Diagnosis not present

## 2023-09-01 DIAGNOSIS — Z905 Acquired absence of kidney: Secondary | ICD-10-CM | POA: Diagnosis not present

## 2023-09-01 DIAGNOSIS — N319 Neuromuscular dysfunction of bladder, unspecified: Secondary | ICD-10-CM | POA: Diagnosis present

## 2023-09-01 DIAGNOSIS — Z85528 Personal history of other malignant neoplasm of kidney: Secondary | ICD-10-CM | POA: Diagnosis not present

## 2023-09-01 DIAGNOSIS — R933 Abnormal findings on diagnostic imaging of other parts of digestive tract: Secondary | ICD-10-CM | POA: Diagnosis not present

## 2023-09-01 DIAGNOSIS — B964 Proteus (mirabilis) (morganii) as the cause of diseases classified elsewhere: Secondary | ICD-10-CM | POA: Diagnosis not present

## 2023-09-01 DIAGNOSIS — K5981 Ogilvie syndrome: Secondary | ICD-10-CM | POA: Diagnosis present

## 2023-09-01 DIAGNOSIS — D500 Iron deficiency anemia secondary to blood loss (chronic): Secondary | ICD-10-CM | POA: Diagnosis present

## 2023-09-01 DIAGNOSIS — Z87891 Personal history of nicotine dependence: Secondary | ICD-10-CM | POA: Diagnosis not present

## 2023-09-01 DIAGNOSIS — R339 Retention of urine, unspecified: Secondary | ICD-10-CM | POA: Diagnosis not present

## 2023-09-01 LAB — CBC
HCT: 33.7 % — ABNORMAL LOW (ref 39.0–52.0)
Hemoglobin: 10.5 g/dL — ABNORMAL LOW (ref 13.0–17.0)
MCH: 25.1 pg — ABNORMAL LOW (ref 26.0–34.0)
MCHC: 31.2 g/dL (ref 30.0–36.0)
MCV: 80.6 fL (ref 80.0–100.0)
Platelets: 184 10*3/uL (ref 150–400)
RBC: 4.18 MIL/uL — ABNORMAL LOW (ref 4.22–5.81)
RDW: 18.2 % — ABNORMAL HIGH (ref 11.5–15.5)
WBC: 7.4 10*3/uL (ref 4.0–10.5)
nRBC: 0 % (ref 0.0–0.2)

## 2023-09-01 LAB — BASIC METABOLIC PANEL WITH GFR
Anion gap: 7 (ref 5–15)
BUN: 19 mg/dL (ref 8–23)
CO2: 27 mmol/L (ref 22–32)
Calcium: 8.4 mg/dL — ABNORMAL LOW (ref 8.9–10.3)
Chloride: 106 mmol/L (ref 98–111)
Creatinine, Ser: 0.96 mg/dL (ref 0.61–1.24)
GFR, Estimated: >60 mL/min (ref >60)
Glucose, Bld: 95 mg/dL (ref 70–99)
Potassium: 3.1 mmol/L — ABNORMAL LOW (ref 3.5–5.1)
Sodium: 140 mmol/L (ref 135–145)

## 2023-09-01 LAB — C DIFFICILE QUICK SCREEN W PCR REFLEX
C Diff antigen: NEGATIVE
C Diff interpretation: NOT DETECTED
C Diff toxin: NEGATIVE

## 2023-09-01 MED ORDER — METOPROLOL SUCCINATE ER 25 MG PO TB24
12.5000 mg | ORAL_TABLET | Freq: Every day | ORAL | Status: DC
  Administered 2023-09-01 – 2023-09-06 (×6): 12.5 mg via ORAL
  Filled 2023-09-01 (×3): qty 1
  Filled 2023-09-01: qty 0.5
  Filled 2023-09-01 (×2): qty 1

## 2023-09-01 MED ORDER — MIRABEGRON ER 25 MG PO TB24
25.0000 mg | ORAL_TABLET | Freq: Every day | ORAL | Status: DC
  Administered 2023-09-01 – 2023-09-06 (×6): 25 mg via ORAL
  Filled 2023-09-01 (×6): qty 1

## 2023-09-01 MED ORDER — SERTRALINE HCL 50 MG PO TABS
50.0000 mg | ORAL_TABLET | Freq: Every day | ORAL | Status: DC
  Administered 2023-09-01 – 2023-09-06 (×6): 50 mg via ORAL
  Filled 2023-09-01 (×6): qty 1

## 2023-09-01 MED ORDER — PSYLLIUM 95 % PO PACK
1.0000 | PACK | Freq: Every day | ORAL | Status: DC
  Filled 2023-09-01: qty 1

## 2023-09-01 MED ORDER — VITAMIN B-12 1000 MCG PO TABS
1000.0000 ug | ORAL_TABLET | Freq: Every day | ORAL | Status: DC
  Administered 2023-09-01 – 2023-09-06 (×6): 1000 ug via ORAL
  Filled 2023-09-01 (×6): qty 1

## 2023-09-01 MED ORDER — POLYSACCHARIDE IRON COMPLEX 150 MG PO CAPS
150.0000 mg | ORAL_CAPSULE | Freq: Every day | ORAL | Status: DC
  Administered 2023-09-01 – 2023-09-06 (×6): 150 mg via ORAL
  Filled 2023-09-01 (×6): qty 1

## 2023-09-01 MED ORDER — TAMSULOSIN HCL 0.4 MG PO CAPS
0.4000 mg | ORAL_CAPSULE | Freq: Every day | ORAL | Status: DC
  Administered 2023-09-01 – 2023-09-06 (×6): 0.4 mg via ORAL
  Filled 2023-09-01 (×6): qty 1

## 2023-09-01 MED ORDER — PANTOPRAZOLE SODIUM 20 MG PO TBEC
20.0000 mg | DELAYED_RELEASE_TABLET | Freq: Every day | ORAL | Status: DC
  Administered 2023-09-01 – 2023-09-06 (×6): 20 mg via ORAL
  Filled 2023-09-01 (×6): qty 1

## 2023-09-01 MED ORDER — PIPERACILLIN-TAZOBACTAM 3.375 G IVPB
3.3750 g | Freq: Three times a day (TID) | INTRAVENOUS | Status: DC
  Administered 2023-09-01 – 2023-09-03 (×7): 3.375 g via INTRAVENOUS
  Filled 2023-09-01 (×7): qty 50

## 2023-09-01 MED ORDER — GABAPENTIN 300 MG PO CAPS
600.0000 mg | ORAL_CAPSULE | Freq: Two times a day (BID) | ORAL | Status: DC
  Administered 2023-09-01 – 2023-09-06 (×10): 600 mg via ORAL
  Filled 2023-09-01 (×11): qty 2

## 2023-09-01 MED ORDER — ATORVASTATIN CALCIUM 20 MG PO TABS
40.0000 mg | ORAL_TABLET | Freq: Every day | ORAL | Status: DC
  Administered 2023-09-01 – 2023-09-06 (×6): 40 mg via ORAL
  Filled 2023-09-01 (×6): qty 2

## 2023-09-01 MED ORDER — MORPHINE SULFATE (PF) 2 MG/ML IV SOLN
2.0000 mg | INTRAVENOUS | Status: DC | PRN
  Administered 2023-09-01: 1 mg via INTRAVENOUS

## 2023-09-01 MED ORDER — TRAMADOL HCL 50 MG PO TABS: 50.0000 mg | ORAL_TABLET | Freq: Four times a day (QID) | ORAL | Status: DC | PRN

## 2023-09-01 MED ORDER — FESOTERODINE FUMARATE ER 4 MG PO TB24
4.0000 mg | ORAL_TABLET | Freq: Every day | ORAL | Status: DC
  Administered 2023-09-01 – 2023-09-06 (×6): 4 mg via ORAL
  Filled 2023-09-01 (×6): qty 1

## 2023-09-01 NOTE — Plan of Care (Signed)
 Received report. Awake when checked, upset due to NG tube. Noted R nare waiting on portable xray to verify placement. Secured with tape. Air entry into tube and unable to hear air entry into stomach. States will pull it out, re-assurance given as to why it is needed, why he is here, re-orientated to situation. Remains upset. Reports hurts to nose and throat from NG tube. Screaming "pull it out." 2000 Assessment completed. Suprapubic catheter in place, draining amber urine. Given morphine as ordered. Vital signs noted.  2026 On call MD notified, of  continued pain and morphine has worked as yet.  2025 portable xray present, unable to see NB tube, repositioned to L nare @ 29. Repeat xray done and NG visible, air entry into stomach auscultated.  2029 Added morphine order received.  2030 received call from tele, re: run of VT. Checked and attempting to remove NG tube. Denies chest pain, no difficulty breathing, diaphoresis or nausea/vomiting. Settled with distractive conversation.  2048 Given second 1mg  morphine for nose/throat discomfort. MD aware of need for soft restraints, and anxiolytic, continues to attempt to pull out NG tube.  2130 reports pain has eased to nose and throat.  2232 Received order, waiting for restraints from supply.  2300 Soft restraints to both wrists with good color, sensation and movement. Secured to bottom of bed.  Attempting to remove NG from L nare. Reminded why NG was placed. Reports "I don't care" 2325 Self removed NG tube. Re-inserted to R nare, Secured at 32, waiting for xray.  0026 Second xray done, waiting report. 0100 Good color, sensation and movement to both hands, HOB elevated 30 degrees, NG clamped. Waiting on radiology. 0105 Given haldol as ordered for irritation, increased agitation, informed med may help with sleeping.  [id number] Radiology called and result over phone, epic is down. 0120 NG pulled back to 27, air entry into stomach heard on auscultation. Connected to  wall suction. Stomach contents seen.  Re-taped, upset at tape on nose. Denies pain at this time.  0400 Turned q2hrs, Circulation to bilateral wrist Q2hrs. Monitored. Slept on and off all night.

## 2023-09-01 NOTE — Progress Notes (Signed)
 Pharmacy Antibiotic Note  Adam Rios. is a 83 y.o. male admitted on 08/31/2023 with proctocolitis.  Pharmacy has been consulted for Zosyn dosing.  Plan: Zosyn 3.375g IV q8h (4 hour infusion).  Pharmacy will continue to follow and will adjust abx dosing whenever warranted.  Temp (24hrs), Avg:97.9 F (36.6 C), Min:97.7 F (36.5 C), Max:98.1 F (36.7 C)   Recent Labs  Lab 08/31/23 1510 08/31/23 1725 08/31/23 2003  WBC 8.9  --   --   CREATININE 0.89  --   --   LATICACIDVEN  --  2.5* 1.7    Estimated Creatinine Clearance: 71.1 mL/min (by C-G formula based on SCr of 0.89 mg/dL).    Allergies  Allergen Reactions   Empagliflozin     Recurrent UTI with sepsis on jardiance.   Fentanyl Nausea Only   Oxycodone Nausea And Vomiting    Antimicrobials this admission: 04/01 Zosyn >>   Microbiology results: 04/01 BCx: Pending 04/01 UCx: Pending  04/01 C Diff: Sent  Thank you for allowing pharmacy to be a part of this patient's care.  Otelia Sergeant, PharmD, [facility] 09/01/2023 1:38 AM

## 2023-09-01 NOTE — Progress Notes (Signed)
 PROGRESS NOTE    Adam Rios.  JXB:[id number] DOB: 1941-02-10 DOA: 08/31/2023 PCP: Laqueta Due, MD  Chief Complaint  Patient presents with   Abdominal Pain    Hospital Course:  Adam Rios. is 83 y.o. male with dementia, hypertension, hyperlipidemia, diastolic CHF, stroke, dementia, BPH, depression with anxiety, iron deficiency anemia, renal cell carcinoma, neurogenic bladder, suprapubic catheter, A-fib not on anticoagulation, who presents with abdominal pain.  Patient has had abdominal pain and distention for the last 2 days with some nausea though no vomiting.  He has had clear mucus in his stool.  In the ED labs and vitals are mostly unremarkable.  CT abdomen pelvis reveals gaseous dilation of the redundant sigmoid colon measuring 7.8 cm in diameter with tapering to the sigmoid colon where there is mild wall thickening.  Findings favor ileus secondary to proctocolitis.  Subjective: This morning patient denies any gas or bowel movements.  Believes his abdominal pain is somewhat improved.  Denies an appetite.  Objective: Vitals:   09/01/23 1300 09/01/23 1439 09/01/23 1600 09/01/23 1732  BP: 116/74  117/72 124/73  Pulse: 62  62 60  Resp: 18  14 16   Temp:  98.2 F (36.8 C)  98.3 F (36.8 C)  TempSrc:  Oral    SpO2: 94%  93% 98%  Weight:      Height:        Intake/Output Summary (Last 24 hours) at 09/01/2023 1800 Last data filed at 08/31/2023 2249 Gross per 24 hour  Intake 1050 ml  Output --  Net 1050 ml   Filed Weights   08/31/23 1508  Weight: 90.7 kg    Examination: General exam: Appears calm and comfortable, NAD  Respiratory system: No work of breathing, symmetric chest wall expansion Cardiovascular system: S1 & S2 heard, RRR.  Gastrointestinal system: Abdomen is distended but soft, no bowel sounds. Neuro: Alert and oriented. No focal neurological deficits. Extremities: Symmetric, expected ROM Skin: No rashes, lesions Psychiatry: Demonstrates appropriate  judgement and insight. Mood & affect appropriate for situation.   Assessment & Plan:  Principal Problem:   Proctocolitis Active Problems:   Chronic diastolic CHF (congestive heart failure) (HCC)   Iron deficiency anemia due to chronic blood loss   Essential hypertension   Dyslipidemia   Hypokalemia   History of stroke   Neurogenic bladder   Depression    Ileus - Sigmoid colon measuring 7.8 cm in diameter on CT. Appears to have Ileus on KUB today, though pending read at this time. - Appears this may be secondary to proctocolitis - Continue with Zosyn for now - Have ordered for NG tube placement, start LIS - If without resolution will consult for GI in the morning, may require colonic decompression - Keep n.p.o. for now - Avoid opioids if possible - Continue normal saline  Chronic diastolic CHF - Echo May 2024: EF 55 to 60% - Clinically euvolemic at this time - Hold torsemide and spironolactone given n.p.o. - Monitor volume status closely given IV fluids  Iron deficiency anemia - Hemoglobin stable continue to trend  Hypertension - As needed metoprolol and hydralazine  Dyslipidemia - Continue statin when no longer n.p.o.  Hypokalemia - Replace as needed  History of CVA - Resume home meds when able  Neurogenic bladder Suprapubic catheter - Urinalysis with turbid appearance, leukocytes, bacteria - Follow urinary culture - Zosyn as above  Depression - Continue home meds  DVT prophylaxis: Heparin   Code Status: Full Code Family Communication:  Discussed directly with patient and daughter, [name] on the phone.  Disposition:  Inpatient still hospitalized for work up, NGT today  Consultants:    Procedures:    Antimicrobials:  Anti-infectives (From admission, onward)    Start     Dose/Rate Route Frequency Ordered Stop   09/01/23 0500  piperacillin-tazobactam (ZOSYN) IVPB 3.375 g        3.375 g 12.5 mL/hr over 240 Minutes Intravenous Every 8 hours  09/01/23 0137     08/31/23 2145  piperacillin-tazobactam (ZOSYN) IVPB 3.375 g        3.375 g 100 mL/hr over 30 Minutes Intravenous  Once 08/31/23 2134 08/31/23 2249       Data Reviewed: I have personally reviewed following labs and imaging studies CBC: Recent Labs  Lab 08/31/23 1510 09/01/23 0443  WBC 8.9 7.4  HGB 11.9* 10.5*  HCT 38.9* 33.7*  MCV 81.2 80.6  PLT 214 184   Basic Metabolic Panel: Recent Labs  Lab 08/31/23 1510 09/01/23 0443  NA 138 140  K 3.3* 3.1*  CL 104 106  CO2 24 27  GLUCOSE 122* 95  BUN 21 19  CREATININE 0.89 0.96  CALCIUM 8.7* 8.4*  MG 2.3  --    GFR: Estimated Creatinine Clearance: 65.9 mL/min (by C-G formula based on SCr of 0.96 mg/dL). Liver Function Tests: Recent Labs  Lab 08/31/23 1510  AST 17  ALT 12  ALKPHOS 79  BILITOT 0.6  PROT 6.7  ALBUMIN 3.2*   CBG: No results for input(s): "GLUCAP" in the last 168 hours.  Recent Results (from the past 240 hours)  Urine Culture (for pregnant, neutropenic or urologic patients or patients with an indwelling urinary catheter)     Status: Abnormal (Preliminary result)   Collection Time: 08/31/23  3:10 PM   Specimen: Urine, Random  Result Value Ref Range Status   Specimen Description   Final    URINE, RANDOM Performed at [facility], [address] [place], [place] [address]    Special Requests   Final    NONE Performed at [facility], [address], [place], [place] [address]    Culture (A)  Final    >=100,000 COLONIES/mL GRAM NEGATIVE RODS IDENTIFICATION AND SUSCEPTIBILITIES TO FOLLOW Performed at [facility], [address] [place], [place] [address]    Report Status PENDING  Incomplete  Culture, blood (Routine X 2) w Reflex to ID Panel     Status: None (Preliminary result)   Collection Time: 08/31/23 10:50 PM   Specimen: BLOOD  Result Value Ref Range Status   Specimen Description BLOOD LEFT FOREARM  Final   Special Requests   Final    BOTTLES  DRAWN AEROBIC ONLY Blood Culture results may not be optimal due to an inadequate volume of blood received in culture bottles   Culture   Final    NO GROWTH < 12 HOURS Performed at [facility] [place], [address] [place], [place] [address]    Report Status PENDING  Incomplete  Culture, blood (Routine X 2) w Reflex to ID Panel     Status: None (Preliminary result)   Collection Time: 08/31/23 10:50 PM   Specimen: BLOOD  Result Value Ref Range Status   Specimen Description BLOOD LEFT ASSIST CONTROL  Final   Special Requests   Final    BOTTLES DRAWN AEROBIC ONLY Blood Culture results may not be optimal due to an inadequate volume of blood received in culture bottles   Culture  Final    NO GROWTH < 12 HOURS Performed at [facility], [address] [place], [place] [address]    Report Status PENDING  Incomplete  C Difficile Quick Screen w PCR reflex     Status: None   Collection Time: 09/01/23  1:51 PM   Specimen: STOOL  Result Value Ref Range Status   C Diff antigen NEGATIVE NEGATIVE Final   C Diff toxin NEGATIVE NEGATIVE Final   C Diff interpretation No C. difficile detected.  Final    Comment: Performed at [facility], [address], [place], [place] [address]     Radiology Studies: CT ABDOMEN PELVIS W CONTRAST Result Date: 08/31/2023 CLINICAL DATA:  Acute nonlocalized abdominal pain. Abdominal distension EXAM: CT ABDOMEN AND PELVIS WITH CONTRAST TECHNIQUE: Multidetector CT imaging of the abdomen and pelvis was performed using the standard protocol following bolus administration of intravenous contrast. RADIATION DOSE REDUCTION: This exam was performed according to the departmental dose-optimization program which includes automated exposure control, adjustment of the mA and/or kV according to patient size and/or use of iterative reconstruction technique. CONTRAST:  OMNIPAQUE IOHEXOL 300 MG/ML  SOLN COMPARISON:  11/01/2022 FINDINGS: Lower chest: No acute  abnormality. Hepatobiliary: Small left hepatic lobe cyst is unchanged. Unremarkable gallbladder and biliary tree. Pancreas: Unremarkable. Spleen: Unremarkable. Adrenals/Urinary Tract: Normal adrenal glands. Suprapubic catheter in the nondistended bladder. Left nephrectomy. No urinary calculi or hydronephrosis in the right kidney or ureter. Stomach/Bowel: Gaseous dilation of the redundant sigmoid colon measuring up to 7.8 cm in diameter. No evidence of volvulus. There is tapering to the distal sigmoid colon where there is mild wall thickening (series 5/image 43). Additional wall thickening in the distal sigmoid colon and rectum. Moderate stool in the colon upstream from the sigmoid colon. Stomach is within normal limits. Appendix is not visualized. Vascular/Lymphatic: Aortic atherosclerosis. No enlarged abdominal or pelvic lymph nodes. Reproductive: No acute abnormality. Other: No free intraperitoneal fluid or air.  No abscess. Musculoskeletal: Left THA.  No acute fracture. IMPRESSION: 1. Gaseous dilation of the redundant sigmoid colon measuring up to 7.8 cm in diameter. There is tapering to the sigmoid colon where there is mild wall thickening. Findings favor ileus secondary to proctocolitis. 2. Suprapubic catheter in the nondistended bladder. 3. Aortic Atherosclerosis (ICD10-I70.0). Electronically Signed   By: Minerva Fester M.D.   On: 08/31/2023 19:30    Scheduled Meds:  atorvastatin  40 mg Oral Daily   cyanocobalamin  1,000 mcg Oral Daily   fesoterodine  4 mg Oral Daily   gabapentin  600 mg Oral BID   heparin  5,000 Units Subcutaneous Q8H   iron polysaccharides  150 mg Oral Daily   metoprolol succinate  12.5 mg Oral Daily   mirabegron ER  25 mg Oral Daily   pantoprazole  20 mg Oral Daily   potassium chloride  40 mEq Oral Once   psyllium  1 packet Oral Daily   sertraline  50 mg Oral Daily   tamsulosin  0.4 mg Oral Daily   Continuous Infusions:  sodium chloride 75 mL/hr at 08/31/23 2340    piperacillin-tazobactam (ZOSYN)  IV 3.375 g (09/01/23 1340)     LOS: 0 days  MDM: Patient is high risk for one or more organ failure.  They necessitate ongoing hospitalization for continued IV therapies and subsequent lab monitoring. Total time spent interpreting labs and vitals, coordinating care amongst consultants and care team members, directly assessing and discussing care with the patient and/or family: 55 min  Debarah Crape, DO [facility]  To contact the attending physician between 7A-7P please use Epic Chat. To contact the covering physician during after hours 7P-7A, please review Amion.   09/01/2023, 6:00 PM   *This document has been created with the assistance of dictation software. Please excuse typographical errors. *

## 2023-09-02 ENCOUNTER — Inpatient Hospital Stay: Payer: Medicare (Managed Care)

## 2023-09-02 ENCOUNTER — Inpatient Hospital Stay: Payer: Medicare (Managed Care) | Admitting: Anesthesiology

## 2023-09-02 ENCOUNTER — Encounter
Admission: EM | Disposition: A | Discharge status: Nursing Home (Includes ICF) | Payer: Self-pay | Source: Skilled Nursing Facility | Attending: Family Medicine

## 2023-09-02 ENCOUNTER — Encounter: Payer: Self-pay | Admitting: Family Medicine

## 2023-09-02 DIAGNOSIS — K529 Noninfective gastroenteritis and colitis, unspecified: Secondary | ICD-10-CM | POA: Diagnosis not present

## 2023-09-02 DIAGNOSIS — R933 Abnormal findings on diagnostic imaging of other parts of digestive tract: Secondary | ICD-10-CM | POA: Diagnosis not present

## 2023-09-02 DIAGNOSIS — K5939 Other megacolon: Secondary | ICD-10-CM | POA: Diagnosis not present

## 2023-09-02 DIAGNOSIS — D500 Iron deficiency anemia secondary to blood loss (chronic): Secondary | ICD-10-CM | POA: Diagnosis not present

## 2023-09-02 DIAGNOSIS — I5032 Chronic diastolic (congestive) heart failure: Secondary | ICD-10-CM | POA: Diagnosis not present

## 2023-09-02 DIAGNOSIS — F32A Depression, unspecified: Secondary | ICD-10-CM | POA: Diagnosis not present

## 2023-09-02 HISTORY — PX: FLEXIBLE SIGMOIDOSCOPY: SHX5431

## 2023-09-02 LAB — COMPREHENSIVE METABOLIC PANEL WITH GFR
ALT: 10 U/L (ref 0–44)
AST: 14 U/L — ABNORMAL LOW (ref 15–41)
Albumin: 2.7 g/dL — ABNORMAL LOW (ref 3.5–5.0)
Alkaline Phosphatase: 60 U/L (ref 38–126)
Anion gap: 7 (ref 5–15)
BUN: 17 mg/dL (ref 8–23)
CO2: 25 mmol/L (ref 22–32)
Calcium: 8.2 mg/dL — ABNORMAL LOW (ref 8.9–10.3)
Chloride: 108 mmol/L (ref 98–111)
Creatinine, Ser: 0.83 mg/dL (ref 0.61–1.24)
GFR, Estimated: >60 mL/min (ref >60)
Glucose, Bld: 71 mg/dL (ref 70–99)
Potassium: 2.9 mmol/L — ABNORMAL LOW (ref 3.5–5.1)
Sodium: 140 mmol/L (ref 135–145)
Total Bilirubin: 0.7 mg/dL (ref 0.0–1.2)
Total Protein: 5.9 g/dL — ABNORMAL LOW (ref 6.5–8.1)

## 2023-09-02 LAB — CBC
HCT: 33.5 % — ABNORMAL LOW (ref 39.0–52.0)
Hemoglobin: 10.4 g/dL — ABNORMAL LOW (ref 13.0–17.0)
MCH: 25.1 pg — ABNORMAL LOW (ref 26.0–34.0)
MCHC: 31 g/dL (ref 30.0–36.0)
MCV: 80.9 fL (ref 80.0–100.0)
Platelets: 189 10*3/uL (ref 150–400)
RBC: 4.14 MIL/uL — ABNORMAL LOW (ref 4.22–5.81)
RDW: 18.1 % — ABNORMAL HIGH (ref 11.5–15.5)
WBC: 7.2 10*3/uL (ref 4.0–10.5)
nRBC: 0 % (ref 0.0–0.2)

## 2023-09-02 LAB — URINE CULTURE: Culture: >=100000 — AB

## 2023-09-02 LAB — GLUCOSE, CAPILLARY: Glucose-Capillary: 71 mg/dL (ref 70–99)

## 2023-09-02 SURGERY — SIGMOIDOSCOPY, FLEXIBLE
Anesthesia: General

## 2023-09-02 MED ORDER — ONDANSETRON HCL 4 MG/2ML IJ SOLN
INTRAMUSCULAR | Status: DC | PRN
Start: 2023-09-02 — End: 2023-09-02
  Administered 2023-09-02: 4 mg via INTRAVENOUS

## 2023-09-02 MED ORDER — SUCCINYLCHOLINE CHLORIDE 200 MG/10ML IV SOSY
PREFILLED_SYRINGE | INTRAVENOUS | Status: DC | PRN
  Administered 2023-09-02: 100 mg via INTRAVENOUS

## 2023-09-02 MED ORDER — POTASSIUM CHLORIDE 10 MEQ/100ML IV SOLN
10.0000 meq | INTRAVENOUS | Status: AC
  Administered 2023-09-02 (×5): 10 meq via INTRAVENOUS
  Filled 2023-09-02 (×5): qty 100

## 2023-09-02 MED ORDER — LORAZEPAM 2 MG/ML IJ SOLN
0.5000 mg | INTRAMUSCULAR | Status: DC | PRN
  Administered 2023-09-02: 0.5 mg via INTRAVENOUS
  Filled 2023-09-02: qty 1

## 2023-09-02 MED ORDER — SEVOFLURANE IN SOLN
RESPIRATORY_TRACT | Status: AC
  Filled 2023-09-02: qty 250

## 2023-09-02 MED ORDER — LIDOCAINE HCL (CARDIAC) PF 100 MG/5ML IV SOSY
PREFILLED_SYRINGE | INTRAVENOUS | Status: DC | PRN
  Administered 2023-09-02: 100 mg via INTRAVENOUS

## 2023-09-02 MED ORDER — TRAZODONE HCL 50 MG PO TABS: 25.0000 mg | ORAL_TABLET | Freq: Every evening | ORAL | Status: DC | PRN

## 2023-09-02 MED ORDER — DEXAMETHASONE SODIUM PHOSPHATE 10 MG/ML IJ SOLN
INTRAMUSCULAR | Status: DC | PRN
  Administered 2023-09-02: 10 mg via INTRAVENOUS

## 2023-09-02 MED ORDER — HALOPERIDOL LACTATE 5 MG/ML IJ SOLN
1.0000 mg | Freq: Four times a day (QID) | INTRAMUSCULAR | Status: DC | PRN
  Filled 2023-09-02: qty 1

## 2023-09-02 MED ORDER — SODIUM CHLORIDE 0.9 % IV SOLN: INTRAVENOUS | Status: DC | PRN

## 2023-09-02 MED ORDER — SODIUM CHLORIDE 0.9 % IV SOLN: INTRAVENOUS | Status: DC

## 2023-09-02 MED ORDER — EPHEDRINE SULFATE-NACL 50-0.9 MG/10ML-% IV SOSY
PREFILLED_SYRINGE | INTRAVENOUS | Status: DC | PRN
  Administered 2023-09-02 (×2): 10 mg via INTRAVENOUS

## 2023-09-02 MED ORDER — PROPOFOL 10 MG/ML IV BOLUS
INTRAVENOUS | Status: DC | PRN
  Administered 2023-09-02: 120 mg via INTRAVENOUS

## 2023-09-02 NOTE — Anesthesia Preprocedure Evaluation (Addendum)
 Anesthesia Evaluation  Patient identified by MRN, date of birth, ID band Patient confused    Reviewed: Allergy & Precautions, H&P , NPO status , Patient's Chart, lab work & pertinent test results  History of Anesthesia Complications (+) PONV and history of anesthetic complications  Airway Mallampati: IV  TM Distance: >3 FB Neck ROM: full    Dental  (+) Poor Dentition, Chipped   Pulmonary neg shortness of breath, neg sleep apnea, neg COPD, neg recent URI, Patient abstained from smoking.Not current smoker, former smoker   Pulmonary exam normal breath sounds clear to auscultation       Cardiovascular Exercise Tolerance: Good METShypertension, Pt. on medications (-) angina + Past MI, + Peripheral Vascular Disease and +CHF  (-) CAD and (-) Cardiac Stents + dysrhythmias Atrial Fibrillation (-) Valvular Problems/Murmurs Rhythm:regular Rate:Tachycardia  TTE 09/2022: 1. Consider infiltrative process ie..Amyloid/Sarcoid.   2. Left ventricular ejection fraction, by estimation, is 55 to 60%. The  left ventricle has normal function. The left ventricle has no regional  wall motion abnormalities. There is moderate concentric left ventricular  hypertrophy. Left ventricular  diastolic parameters were normal.   3. Right ventricular systolic function is normal. The right ventricular  size is normal.   4. Left atrial size was moderately dilated.   5. Right atrial size was mild to moderately dilated.   6. The mitral valve is normal in structure. Trivial mitral valve  regurgitation.   7. The aortic valve is grossly normal. Aortic valve regurgitation is  trivial. Aortic valve sclerosis is present, with no evidence of aortic  valve stenosis.      Neuro/Psych neg Seizures PSYCHIATRIC DISORDERS  Depression   Dementia CVA, No Residual Symptoms negative neurological ROS     GI/Hepatic ,GERD  Medicated and Controlled,,(+)     (-) substance abuse     Endo/Other  neg diabetes    Renal/GU Renal disease     Musculoskeletal   Abdominal   Peds  Hematology   Anesthesia Other Findings Past Medical History: No date: Cognitive impairment No date: Complication of anesthesia     Comment:  Fentanyl causes nausea No date: Hemorrhagic cerebrovascular accident (CVA) (HCC) No date: Hypertension No date: Neurogenic bladder 2005: Renal cell carcinoma (HCC) No date: Renal disorder No date: Stroke [facility])     Comment:   01/17/18, 12/21   Some weakness and cognitive decline  Reproductive/Obstetrics                             Anesthesia Physical Anesthesia Plan  ASA: 3  Anesthesia Plan: General   Post-op Pain Management: Ofirmev IV (intra-op)* and Gabapentin PO (pre-op)*   Induction: Intravenous, Rapid sequence and Cricoid pressure planned  PONV Risk Score and Plan: 1 and 3 and Treatment may vary due to age or medical condition, Dexamethasone and Ondansetron  Airway Management Planned: Oral ETT  Additional Equipment: None  Intra-op Plan:   Post-operative Plan: Extubation in OR  Informed Consent: I have reviewed the patients History and Physical, chart, labs and discussed the procedure including the risks, benefits and alternatives for the proposed anesthesia with the patient or authorized representative who has indicated his/her understanding and acceptance.     Dental Advisory Given  Plan Discussed with: CRNA  Anesthesia Plan Comments: (Patient is confused and has history of vascular dementia, but has been having abdominal pain and maybe some nausea symptoms per daughter.  Discussed risks of anesthesia with patient's daughter (POA),  including PONV, sore throat, lip/dental/eye damage. Rare risks discussed as well, such as cardiorespiratory and neurological sequelae, and allergic reactions. Discussed the role of CRNA in patient's perioperative care. Daughter  understands and consents at this time.)         Anesthesia Quick Evaluation

## 2023-09-02 NOTE — Anesthesia Postprocedure Evaluation (Signed)
 Anesthesia Post Note  Patient: Adam Rios.  Procedure(s) Performed: SIGMOIDOSCOPY, FLEXIBLE  Patient location during evaluation: Endoscopy Anesthesia Type: General Level of consciousness: awake and alert Pain management: pain level controlled Vital Signs Assessment: post-procedure vital signs reviewed and stable Respiratory status: spontaneous breathing, nonlabored ventilation, respiratory function stable and patient connected to nasal cannula oxygen Cardiovascular status: blood pressure returned to baseline and stable Postop Assessment: no apparent nausea or vomiting Anesthetic complications: no   No notable events documented.   Last Vitals:  Vitals:   09/02/23 1758 09/02/23 1801  BP: 118/69 128/78  Pulse: 85 90  Resp: 16 17  Temp:    SpO2: 96% 95%    Last Pain:  Vitals:   09/02/23 1758  TempSrc:   PainSc: Asleep                 Lenard Simmer

## 2023-09-02 NOTE — Progress Notes (Signed)
 PROGRESS NOTE    Adam Rios.  NWG:[id number] DOB: [birth year] DOA: 08/31/2023 PCP: Laqueta Due, MD  Chief Complaint  Patient presents with   Abdominal Pain    Hospital Course:  Adam Rios. is 83 y.o. male with dementia, hypertension, hyperlipidemia, diastolic CHF, stroke, dementia, BPH, depression with anxiety, iron deficiency anemia, renal cell carcinoma, neurogenic bladder, suprapubic catheter, A-fib not on anticoagulation, who presents with abdominal pain.  Patient has had abdominal pain and distention for the last 2 days with some nausea though no vomiting.  He has had clear mucus in his stool.  In the ED labs and vitals are mostly unremarkable.  CT abdomen pelvis reveals gaseous dilation of the redundant sigmoid colon measuring 7.8 cm in diameter with tapering to the sigmoid colon where there is mild wall thickening.  Findings favor ileus secondary to proctocolitis.  Subjective: KUB with continued worsening distention of the sigmoid colon. Pt reports gas, no Bms. He is agitated and more confused today.   Objective: Vitals:   09/01/23 2004 09/02/23 0100 09/02/23 0321 09/02/23 0427  BP: 130/83 110/78 118/81   Pulse: 64 62 66   Resp: 18 18 16    Temp: 97.7 F (36.5 C) 98 F (36.7 C) 98.1 F (36.7 C)   TempSrc:      SpO2: 98% 98% 95%   Weight:    86.6 kg  Height:        Intake/Output Summary (Last 24 hours) at 09/02/2023 0831 Last data filed at 09/01/2023 2057 Gross per 24 hour  Intake --  Output 275 ml  Net -275 ml   Filed Weights   08/31/23 1508 09/02/23 0427  Weight: 90.7 kg 86.6 kg    Examination: General exam: Appears calm and comfortable, NAD  Respiratory system: No work of breathing, symmetric chest wall expansion Cardiovascular system: S1 & S2 heard, RRR.  Gastrointestinal system: Abdomen is distended but soft, no bowel sounds. Neuro: Alert and oriented. No focal neurological deficits. Extremities: Symmetric, expected ROM Skin: No rashes,  lesions Psychiatry: Demonstrates appropriate judgement and insight. Mood & affect appropriate for situation.   Assessment & Plan:  Principal Problem:   Proctocolitis Active Problems:   Chronic diastolic CHF (congestive heart failure) (HCC)   Iron deficiency anemia due to chronic blood loss   Essential hypertension   Dyslipidemia   Hypokalemia   History of stroke   Neurogenic bladder   Depression   Ileus (HCC)    Ileus/sigmoid colon distention possible obstruction - Sigmoid colon measuring 7.8 cm in diameter on CT. has been getting progressively worse on KUBs. - NG tube placed on 4/2 with no improvement - Initial CT concerning for proctocolitis.  Has been on Zosyn.  Will continue for now. - Have consulted gastroenterology.  Planning for for sigmoidoscopy today. -Keep n.p.o. - Avoid opioids if possible - Cont MIVF  Chronic diastolic CHF - Echo May 2024: EF 55 to 60% - Euvolemic at this time.  Continue to monitor volume status closely considering his IV fluids. - Continue to hold furosemide and spironolactone as he is NPO.  IV Lasix as needed  Iron deficiency anemia - Hemoglobin stable - Continue to trend CBC   Hypertension - Continue closely - As needed metoprolol and hydralazine  Dyslipidemia - Continue statin when no longer n.p.o.  Hypokalemia - Replace as needed  History of CVA - Resume home meds when able  Neurogenic bladder Suprapubic catheter Proteus UTI -- Culture sensitive to zosyn which pt is on for suspected  colitis as above  Depression - Continue home meds  DVT prophylaxis: Heparin   Code Status: Full Code Family Communication:  Discussed directly with patient Disposition: Worsening colonic distention, gastroenterology consulted.  With NG tube. Flex sig today. Consultants:    Procedures:    Antimicrobials:  Anti-infectives (From admission, onward)    Start     Dose/Rate Route Frequency Ordered Stop   09/01/23 0500   piperacillin-tazobactam (ZOSYN) IVPB 3.375 g        3.375 g 12.5 mL/hr over 240 Minutes Intravenous Every 8 hours 09/01/23 0137     08/31/23 2145  piperacillin-tazobactam (ZOSYN) IVPB 3.375 g        3.375 g 100 mL/hr over 30 Minutes Intravenous  Once 08/31/23 2134 08/31/23 2249       Data Reviewed: I have personally reviewed following labs and imaging studies CBC: Recent Labs  Lab 08/31/23 1510 09/01/23 0443  WBC 8.9 7.4  HGB 11.9* 10.5*  HCT 38.9* 33.7*  MCV 81.2 80.6  PLT 214 184   Basic Metabolic Panel: Recent Labs  Lab 08/31/23 1510 09/01/23 0443  NA 138 140  K 3.3* 3.1*  CL 104 106  CO2 24 27  GLUCOSE 122* 95  BUN 21 19  CREATININE 0.89 0.96  CALCIUM 8.7* 8.4*  MG 2.3  --    GFR: Estimated Creatinine Clearance: 65.9 mL/min (by C-G formula based on SCr of 0.96 mg/dL). Liver Function Tests: Recent Labs  Lab 08/31/23 1510  AST 17  ALT 12  ALKPHOS 79  BILITOT 0.6  PROT 6.7  ALBUMIN 3.2*   CBG: Recent Labs  Lab 09/02/23 0737  GLUCAP 71    Recent Results (from the past 240 hours)  Urine Culture (for pregnant, neutropenic or urologic patients or patients with an indwelling urinary catheter)     Status: Abnormal (Preliminary result)   Collection Time: 08/31/23  3:10 PM   Specimen: Urine, Random  Result Value Ref Range Status   Specimen Description   Final    URINE, RANDOM Performed at [facility], [address] [place], [place] [address]    Special Requests   Final    NONE Performed at [facility], [address], [place], [place] [address]    Culture (A)  Final    >=100,000 COLONIES/mL GRAM NEGATIVE RODS IDENTIFICATION AND SUSCEPTIBILITIES TO FOLLOW Performed at [facility], [address], [place], [place] [address]    Report Status PENDING  Incomplete  Culture, blood (Routine X 2) w Reflex to ID Panel     Status: None (Preliminary result)   Collection Time: 08/31/23 10:50 PM   Specimen: BLOOD  Result Value  Ref Range Status   Specimen Description BLOOD LEFT FOREARM  Final   Special Requests   Final    BOTTLES DRAWN AEROBIC ONLY Blood Culture results may not be optimal due to an inadequate volume of blood received in culture bottles   Culture   Final    NO GROWTH < 12 HOURS Performed at [facility], [address], [place], [place] [address]    Report Status PENDING  Incomplete  Culture, blood (Routine X 2) w Reflex to ID Panel     Status: None (Preliminary result)   Collection Time: 08/31/23 10:50 PM   Specimen: BLOOD  Result Value Ref Range Status   Specimen Description BLOOD LEFT ASSIST CONTROL  Final   Special Requests   Final    BOTTLES DRAWN AEROBIC ONLY Blood Culture results  may not be optimal due to an inadequate volume of blood received in culture bottles   Culture   Final    NO GROWTH < 12 HOURS Performed at [facility], [address] [place], [place] [address]    Report Status PENDING  Incomplete  C Difficile Quick Screen w PCR reflex     Status: None   Collection Time: 09/01/23  1:51 PM   Specimen: STOOL  Result Value Ref Range Status   C Diff antigen NEGATIVE NEGATIVE Final   C Diff toxin NEGATIVE NEGATIVE Final   C Diff interpretation No C. difficile detected.  Final    Comment: Performed at [facility], [address], [place], [place] [address]     Radiology Studies: DG Abd 1 View Result Date: 09/02/2023 CLINICAL DATA:  [id number].  Ileus. EXAM: ABDOMEN - 1 VIEW COMPARISON:  CT 08/31/2023, abdomen film yesterday at 12:21 a.m. FINDINGS: There is worsening gas dilatation of the redundant sigmoid colon. Maximum distension is 11 cm, previously 9.2 cm. Correlate clinically to exclude worsening sigmoid obstruction. CT may be helpful. There is no appreciable small bowel dilatation. Moderate retained stool occupies the ascending, transverse and descending colon. There is no supine evidence of free air. Heavy vascular calcifications with no other  significant calcifications. The bladder is catheterized and contains faint contrast. There is osteopenia, degenerative changes of the spine and a left hip arthroplasty. IMPRESSION: 1. Worsening gas dilatation of the redundant sigmoid colon. Correlate clinically to exclude worsening sigmoid obstruction. CT may be helpful. 2. Moderate retained stool in the ascending, transverse and descending colon. 3. Heavy vascular calcifications. 4. These results will be called to the ordering clinician or representative by the Radiologist Assistant, and communication documented in the PACS or [employer]. Electronically Signed   By: Almira Bar M.D.   On: 09/02/2023 07:52   DG Abd Portable 1V Result Date: 09/02/2023 CLINICAL DATA:  [id number].  Check NGT positioning. EXAM: PORTABLE ABDOMEN - 1 VIEW COMPARISON:  Portable abdomen yesterday at 8:20 p.m., CT with IV contrast 08/31/2023. FINDINGS: 12:21 a.m. The tip of the NGT is in the midthoracic esophagus just above the level of the carina and needs to be advanced back into the stomach. Dilated gas distention in the redundant sigmoid colon is again present in the upper abdomen, appears similar. Maximum caliber 7.4 cm. No dilated small bowel segments are seen. In the chest, there is a left chest dual lead pacing system with stable wire insertions, cardiomegaly and mild central vessel prominence, elevated right diaphragm and linear atelectasis in both bases. Calcified lymph node clusters are again noted in the mediastinum in the azygous region and AP window. The aorta is tortuous and calcified with stable visualized mediastinum. No new osseous findings. IMPRESSION: 1. The tip of the NGT is in the midthoracic esophagus just above the level of the carina and needs to be advanced back into the stomach. 2. Dilated gas distention in the redundant sigmoid colon appears similar. 3. Cardiomegaly and mild central vessel prominence. 4. Aortic atherosclerosis. 5. Stable calcified  mediastinal lymph nodes. Electronically Signed   By: Almira Bar M.D.   On: 09/02/2023 00:46   DG Abd Portable 1V Result Date: 09/01/2023 CLINICAL DATA:  NG tube placement EXAM: PORTABLE ABDOMEN - 1 VIEW COMPARISON:  09/01/2023 FINDINGS: NG tube tip is in the fundus of the stomach. Elevation of the right hemidiaphragm. Gaseous distention of the visualized colon in the upper abdomen. IMPRESSION: NG tube tip in the stomach.  Electronically Signed   By: Charlett Nose M.D.   On: 09/01/2023 21:01   DG Abd 1 View Result Date: 09/01/2023 CLINICAL DATA:  Ileus, abdominal pain EXAM: ABDOMEN - 1 VIEW COMPARISON:  08/31/2023 FINDINGS: Three supine frontal views of the abdomen and pelvis are obtained. There is persistent gaseous distension of the large and small bowel, with the colon measuring up to 11 cm in diameter. Moderate retained stool throughout the colon again noted peer in there are no masses or abnormal calcifications. No acute bony abnormalities. IMPRESSION: 1. Continued gaseous distension of the large and small bowel, with large amount of retained stool within the colon. Findings are compatible with ileus versus distal obstruction. Electronically Signed   By: Sharlet Salina M.D.   On: 09/01/2023 19:01   CT ABDOMEN PELVIS W CONTRAST Result Date: 08/31/2023 CLINICAL DATA:  Acute nonlocalized abdominal pain. Abdominal distension EXAM: CT ABDOMEN AND PELVIS WITH CONTRAST TECHNIQUE: Multidetector CT imaging of the abdomen and pelvis was performed using the standard protocol following bolus administration of intravenous contrast. RADIATION DOSE REDUCTION: This exam was performed according to the departmental dose-optimization program which includes automated exposure control, adjustment of the mA and/or kV according to patient size and/or use of iterative reconstruction technique. CONTRAST:  OMNIPAQUE IOHEXOL 300 MG/ML  SOLN COMPARISON:  11/01/2022 FINDINGS: Lower chest: No acute abnormality. Hepatobiliary:  Small left hepatic lobe cyst is unchanged. Unremarkable gallbladder and biliary tree. Pancreas: Unremarkable. Spleen: Unremarkable. Adrenals/Urinary Tract: Normal adrenal glands. Suprapubic catheter in the nondistended bladder. Left nephrectomy. No urinary calculi or hydronephrosis in the right kidney or ureter. Stomach/Bowel: Gaseous dilation of the redundant sigmoid colon measuring up to 7.8 cm in diameter. No evidence of volvulus. There is tapering to the distal sigmoid colon where there is mild wall thickening (series 5/image 43). Additional wall thickening in the distal sigmoid colon and rectum. Moderate stool in the colon upstream from the sigmoid colon. Stomach is within normal limits. Appendix is not visualized. Vascular/Lymphatic: Aortic atherosclerosis. No enlarged abdominal or pelvic lymph nodes. Reproductive: No acute abnormality. Other: No free intraperitoneal fluid or air.  No abscess. Musculoskeletal: Left THA.  No acute fracture. IMPRESSION: 1. Gaseous dilation of the redundant sigmoid colon measuring up to 7.8 cm in diameter. There is tapering to the sigmoid colon where there is mild wall thickening. Findings favor ileus secondary to proctocolitis. 2. Suprapubic catheter in the nondistended bladder. 3. Aortic Atherosclerosis (ICD10-I70.0). Electronically Signed   By: Minerva Fester M.D.   On: 08/31/2023 19:30    Scheduled Meds:  atorvastatin  40 mg Oral Daily   cyanocobalamin  1,000 mcg Oral Daily   fesoterodine  4 mg Oral Daily   gabapentin  600 mg Oral BID   heparin  5,000 Units Subcutaneous Q8H   iron polysaccharides  150 mg Oral Daily   metoprolol succinate  12.5 mg Oral Daily   mirabegron ER  25 mg Oral Daily   pantoprazole  20 mg Oral Daily   potassium chloride  40 mEq Oral Once   sertraline  50 mg Oral Daily   tamsulosin  0.4 mg Oral Daily   Continuous Infusions:  piperacillin-tazobactam (ZOSYN)  IV 3.375 g (09/02/23 0601)     LOS: 1 day  MDM: Patient is high risk for  one or more organ failure.  They necessitate ongoing hospitalization for continued IV therapies and subsequent lab monitoring. Total time spent interpreting labs and vitals, coordinating care amongst consultants and care team members, directly assessing and discussing care  with the patient and/or family: 37 min   Debarah Crape, DO [facility]  To contact the attending physician between 7A-7P please use Epic Chat. To contact the covering physician during after hours 7P-7A, please review Amion.   09/02/2023, 8:31 AM   *This document has been created with the assistance of dictation software. Please excuse typographical errors. *

## 2023-09-02 NOTE — Anesthesia Procedure Notes (Signed)
 Procedure Name: Intubation Date/Time: 09/02/2023 5:14 PM  Performed by: Stormy Fabian, CRNAPre-anesthesia Checklist: Patient identified, Emergency Drugs available, Suction available and Patient being monitored Patient Re-evaluated:Patient Re-evaluated prior to induction Oxygen Delivery Method: Circle system utilized Preoxygenation: Pre-oxygenation with 100% oxygen Induction Type: IV induction, Cricoid Pressure applied and Rapid sequence Laryngoscope Size: Mac and 4 Grade View: Grade II Tube type: Oral Tube size: 7.0 mm Number of attempts: 1 Airway Equipment and Method: Stylet Placement Confirmation: ETT inserted through vocal cords under direct vision, positive ETCO2 and breath sounds checked- equal and bilateral Secured at: 21 cm Tube secured with: Tape Dental Injury: Teeth and Oropharynx as per pre-operative assessment

## 2023-09-02 NOTE — Transfer of Care (Signed)
 Immediate Anesthesia Transfer of Care Note  Patient: Maverick Dieudonne.  Procedure(s) Performed: Procedure(s): SIGMOIDOSCOPY, FLEXIBLE (N/A)  Patient Location: PACU and Endoscopy Unit  Anesthesia Type:General  Level of Consciousness: sedated  Airway & Oxygen Therapy: Patient Spontanous Breathing and Patient connected to nasal cannula oxygen  Post-op Assessment: Report given to RN and Post -op Vital signs reviewed and stable  Post vital signs: Reviewed and stable  Last Vitals:  Vitals:   09/02/23 1646 09/02/23 1740  BP: 138/88 116/64  Pulse: 83 90  Resp: 18 19  Temp: (!) 36.1 C (!) 35.5 C  SpO2: 97% 95%    Complications: No apparent anesthesia complications

## 2023-09-02 NOTE — OR Nursing (Signed)
 Pt incontinence clean up, suprapubic cath not working, linen change, pt with sacral pressure injury

## 2023-09-02 NOTE — Op Note (Signed)
 [facility] Patient Name: Adam Rios Procedure Date: 09/02/2023 4:48 PM MRN: [id number] Account #: [id number] Date of Birth: Feb 25, 1941 Admit Type: Inpatient Age: 83 Room: [facility] ENDO ROOM 4 Gender: Male Note Status: Finalized Instrument Name: Peds Colonoscope [id number] Procedure:             Flexible Sigmoidoscopy Indications:           Abnormal CT of the GI tract Providers:             Midge Minium MD, MD Referring MD:          Jacqulynn Cadet. Aylward MD, MD (Referring MD) Medicines:             Propofol per Anesthesia Complications:         No immediate complications. Procedure:             Pre-Anesthesia Assessment:                        - Prior to the procedure, a History and Physical was                         performed, and patient medications and allergies were                         reviewed. The patient's tolerance of previous                         anesthesia was also reviewed. The risks and benefits                         of the procedure and the sedation options and risks                         were discussed with the patient. All questions were                         answered, and informed consent was obtained. Prior                         Anticoagulants: The patient has taken no anticoagulant                         or antiplatelet agents. ASA Grade Assessment: III - A                         patient with severe systemic disease. After reviewing                         the risks and benefits, the patient was deemed in                         satisfactory condition to undergo the procedure.                        After obtaining informed consent, the scope was passed                         under direct vision. The Colonoscope was introduced  through the anus and advanced to the the descending                         colon. The flexible sigmoidoscopy was accomplished                         without difficulty.  The patient tolerated the                         procedure well. The quality of the bowel preparation                         was poor. Findings:      The perianal and digital rectal examinations were normal.      The colon (entire examined portion) appeared normal. Impression:            - Preparation of the colon was poor.                        - The entire examined colon is normal.                        - No specimens collected. Recommendation:        - Return patient to hospital ward for ongoing care.                        - Resume previous diet. Procedure Code(s):     --- Professional ---                        [phone], Sigmoidoscopy, flexible; diagnostic, including                         collection of specimen(s) by brushing or washing, when                         performed (separate procedure) Diagnosis Code(s):     --- Professional ---                        R93.3, Abnormal findings on diagnostic imaging of                         other parts of digestive tract CPT copyright 2022 American Medical Association. All rights reserved. The codes documented in this report are preliminary and upon coder review may  be revised to meet current compliance requirements. Midge Minium MD, MD 09/02/2023 5:31:17 PM This report has been signed electronically. Number of Addenda: 0 Note Initiated On: 09/02/2023 4:48 PM Total Procedure Duration: 0 hours 6 minutes 22 seconds  Estimated Blood Loss:  Estimated blood loss: none.      [facility]

## 2023-09-02 NOTE — Consult Note (Signed)
 Midge Minium, MD [facility]  [address] [place], [place] [address] Phone: [phone] Fax : [phone]  Consultation  Referring Provider:     Dr. Rennis Chris Primary Care Physician:  Laqueta Due, MD Primary Gastroenterologist: Gentry Fitz         Reason for Consultation:     Dilated sigmoid    Date of Admission:  08/31/2023 Date of Consultation:  09/02/2023         HPI:   Adam Rios. is a 83 y.o. male who was admitted on the first of the month for abdominal pain.  The patient has a past medical history significant for hypertension hyperlipidemia CHF stroke dementia and BPH with iron deficient anemia and renal cell carcinoma.  The patient also has a history of A-fib but is not taking anticoagulation.  It was reported that the patient had come in with abdominal pain and abdominal distention for 2 days.  The patient had a CT scan of the abdomen on admission that showed:  IMPRESSION: 1. Gaseous dilation of the redundant sigmoid colon measuring up to 7.8 cm in diameter. There is tapering to the sigmoid colon where there is mild wall thickening. Findings favor ileus secondary to proctocolitis. 2. Suprapubic catheter in the nondistended bladder. 3. Aortic Atherosclerosis   Subsequent to that the patient had a KUB today that showed:  IMPRESSION: 1. Worsening gas dilatation of the redundant sigmoid colon. Correlate clinically to exclude worsening sigmoid obstruction. CT may be helpful. 2. Moderate retained stool in the ascending, transverse and descending colon. 3. Heavy vascular calcifications. 4. These results will be called to the ordering clinician or representative by the Radiologist Assistant, and communication documented in the PACS or [employer].  Due to these new findings a GI consult was called.  The patient is not able to give much of a history and is not sure of the date although he does know where he is that he does know what month it is.  Past Medical  History:  Diagnosis Date   Cognitive impairment    Complication of anesthesia    Fentanyl causes nausea   Hemorrhagic cerebrovascular accident (CVA) (HCC)    Hypertension    Neurogenic bladder    Renal cell carcinoma (HCC) 2005   Renal disorder    Stroke (HCC)     01/17/18, 12/21   Some weakness and cognitive decline    Past Surgical History:  Procedure Laterality Date   APPENDECTOMY     CATARACT EXTRACTION W/PHACO Right 07/22/2020   Procedure: CATARACT EXTRACTION PHACO AND INTRAOCULAR LENS PLACEMENT (IOC) RIGHT;  Surgeon: Nevada Crane, MD;  Location: [facility];  Service: Ophthalmology;  Laterality: Right;  4.77 0:44.2   CATARACT EXTRACTION W/PHACO Left 08/05/2020   Procedure: CATARACT EXTRACTION PHACO AND INTRAOCULAR LENS PLACEMENT (IOC) LEFT 10.73 01:13.8;  Surgeon: Nevada Crane, MD;  Location: [facility];  Service: Ophthalmology;  Laterality: Left;  Use eye stretcher not chair   HERNIA REPAIR     HIP ARTHROPLASTY Left 11/28/2022   Procedure: ARTHROPLASTY BIPOLAR HIP (HEMIARTHROPLASTY);  Surgeon: Signa Kell, MD;  Location: [facility] ORS;  Service: Orthopedics;  Laterality: Left;   IR CATHETER TUBE CHANGE  06/15/2023   LUMBAR FUSION     NEPHRECTOMY Left 2005   TEMPORARY PACEMAKER N/A 05/03/2022   Procedure: TEMPORARY PACEMAKER;  Surgeon: Alwyn Pea, MD;  Location: [facility] INVASIVE CV LAB;  Service: Cardiovascular;  Laterality: N/A;    Prior to Admission  medications   Medication Sig Start Date End Date Taking? Authorizing Provider  Acetaminophen Extra Strength 500 MG TABS Take 1,000 mg by mouth 3 (three) times daily as needed (PAIN).   Yes [provider]  acetic acid 0.25 % irrigation Apply 1 Application topically as needed. 08/18/23  Yes [provider]  atorvastatin (LIPITOR) 40 MG tablet Take 40 mg by mouth daily.   Yes [provider]  CALPROTECT 0.44-20.6 % OINT Apply 1 Application topically 2 (two) times daily. 06/03/23   Yes [provider]  docusate sodium (COLACE) 100 MG capsule Take 1 capsule (100 mg total) by mouth 2 (two) times daily. 12/04/22  Yes Delfino Lovett, MD  finasteride (PROSCAR) 5 MG tablet Take 5 mg by mouth daily. 04/27/22  Yes [provider]  gabapentin (NEURONTIN) 300 MG capsule Take 2 capsules (600 mg total) by mouth 2 (two) times daily. 06/16/23  Yes Wieting, Richard, MD  hydrocortisone cream ([employer] CORTISONE MAXIMUM STRENGTH) 1 % Apply 1 Application topically as needed for itching.   Yes [provider]  magnesium oxide (MAG-OX) 400 (240 Mg) MG tablet Take 2 tablets by mouth 2 (two) times daily. 06/02/23  Yes [provider]  metoCLOPramide (REGLAN) 5 MG tablet Take 1-2 tablets (5-10 mg total) by mouth every 8 (eight) hours as needed for nausea (if ondansetron (ZOFRAN) ineffective.). 12/04/22  Yes Delfino Lovett, MD  metoprolol succinate (TOPROL-XL) 25 MG 24 hr tablet Take 0.5 tablets (12.5 mg total) by mouth daily. 06/16/23  Yes Wieting, Richard, MD  MYRBETRIQ 25 MG TB24 tablet Take 25 mg by mouth daily. 06/02/23  Yes [provider]  ondansetron (ZOFRAN) 4 MG tablet Take 1 tablet (4 mg total) by mouth every 6 (six) hours as needed for nausea. 12/04/22  Yes Delfino Lovett, MD  pantoprazole (PROTONIX) 20 MG tablet Take 1 tablet (20 mg total) by mouth daily. 10/14/22 09/01/23 Yes Gillis Santa, MD  POLY-IRON 150 FORTE 150-25-1 MG-MCG-MG CAPS Take 1 capsule by mouth daily. 06/02/23  Yes [provider]  Psyllium Fiber 0.52 g CAPS Take 1 capsule by mouth 2 (two) times daily. 06/10/23  Yes [provider]  senna-docusate (SENOKOT-S) 8.6-50 MG tablet Take 1 tablet by mouth 2 (two) times daily as needed. 08/15/22  Yes Wieting, Richard, MD  sertraline (ZOLOFT) 50 MG tablet Take 50 mg by mouth daily. 06/02/23  Yes [provider]  solifenacin (VESICARE) 5 MG tablet Take 5 mg by mouth daily.   Yes [provider]  spironolactone (ALDACTONE) 25 MG tablet  Take 25 mg by mouth daily. 11/05/22 11/05/23 Yes [provider]  tamsulosin (FLOMAX) 0.4 MG CAPS capsule Take 0.4 mg by mouth daily. 10/30/22  Yes [provider]  torsemide 40 MG TABS Take 40 mg by mouth daily. 12/05/22  Yes Delfino Lovett, MD  traMADol (ULTRAM) 50 MG tablet Take 1 tablet (50 mg total) by mouth every 6 (six) hours as needed for moderate pain (pain score 4-6). 06/16/23  Yes Wieting, Richard, MD  vitamin B-12 (CYANOCOBALAMIN) 1000 MCG tablet Take 1,000 mcg by mouth daily.   Yes [provider]  Vitamin D, Ergocalciferol, (DRISDOL) 1.25 MG (50000 UNIT) CAPS capsule Take 50,000 Units by mouth every 7 (seven) days. 06/02/23  Yes [provider]    Family History  Problem Relation Age of Onset   Cerebral aneurysm Father    Cerebral aneurysm Sister      Social History   Tobacco Use   Smoking status: Former  Current packs/day: 0.00    Average packs/day: 0.3 packs/day for 10.0 years (2.5 ttl pk-yrs)    Types: Cigarettes    Start date: 27    Quit date: 2005    Years since quitting: 20.2   Smokeless tobacco: Never  Vaping Use   Vaping status: Never Used  Substance Use Topics   Alcohol use: Not Currently   Drug use: Never    Allergies as of 08/31/2023 - Review Complete 08/31/2023  Allergen Reaction Noted   Empagliflozin  07/07/2022   Fentanyl Nausea Only 07/29/2016   Oxycodone Nausea And Vomiting 02/24/2020    Review of Systems:    All systems reviewed and negative except where noted in HPI.   Physical Exam:  Vital signs in last 24 hours: Temp:  [97.7 F (36.5 C)-98.5 F (36.9 C)] 98.5 F (36.9 C) (04/03 0845) Pulse Rate:  [60-77] 77 (04/03 0845) Resp:  [13-18] 18 (04/03 0845) BP: (110-149)/(72-83) 149/74 (04/03 0845) SpO2:  [93 %-98 %] 97 % (04/03 0845) Weight:  [86.6 kg] 86.6 kg (04/03 0427)   General:   Pleasant, cooperative in NAD Head:  Normocephalic and atraumatic. Eyes:   No icterus.   Conjunctiva pink. PERRLA. Ears:   Normal auditory acuity. Neck:  Supple; no masses or thyroidomegaly Lungs: Respirations even and unlabored. Lungs clear to auscultation bilaterally.   No wheezes, crackles, or rhonchi.  Heart:  Regular rate and rhythm;  Without murmur, clicks, rubs or gallops Abdomen:  Soft, nondistended, nontender. Normal bowel sounds. No appreciable masses or hepatomegaly.  No rebound or guarding.  Rectal:  Not performed. Msk:  Symmetrical without gross deformities.    Extremities:  Without edema, cyanosis or clubbing. Neurologic:  Alert and confused;  grossly normal neurologically. Skin:  Intact without significant lesions or rashes. Cervical Nodes:  No significant cervical adenopathy. Psych:  Alert and cooperative. Normal affect.  LAB RESULTS: Recent Labs    08/31/23 1510 09/01/23 0443 09/02/23 0845  WBC 8.9 7.4 7.2  HGB 11.9* 10.5* 10.4*  HCT 38.9* 33.7* 33.5*  PLT 214 184 189   BMET Recent Labs    08/31/23 1510 09/01/23 0443 09/02/23 0845  NA 138 140 140  K 3.3* 3.1* 2.9*  CL 104 106 108  CO2 24 27 25   GLUCOSE 122* 95 71  BUN 21 19 17   CREATININE 0.89 0.96 0.83  CALCIUM 8.7* 8.4* 8.2*   LFT Recent Labs    09/02/23 0845  PROT 5.9*  ALBUMIN 2.7*  AST 14*  ALT 10  ALKPHOS 60  BILITOT 0.7   PT/INR No results for input(s): "LABPROT", "INR" in the last 72 hours.  STUDIES: DG Abd 1 View Result Date: 09/02/2023 CLINICAL DATA:  [id number].  Ileus. EXAM: ABDOMEN - 1 VIEW COMPARISON:  CT 08/31/2023, abdomen film yesterday at 12:21 a.m. FINDINGS: There is worsening gas dilatation of the redundant sigmoid colon. Maximum distension is 11 cm, previously 9.2 cm. Correlate clinically to exclude worsening sigmoid obstruction. CT may be helpful. There is no appreciable small bowel dilatation. Moderate retained stool occupies the ascending, transverse and descending colon. There is no supine evidence of free air. Heavy vascular calcifications with no other significant calcifications. The bladder is  catheterized and contains faint contrast. There is osteopenia, degenerative changes of the spine and a left hip arthroplasty. IMPRESSION: 1. Worsening gas dilatation of the redundant sigmoid colon. Correlate clinically to exclude worsening sigmoid obstruction. CT may be helpful. 2. Moderate retained stool in the ascending, transverse and descending colon. 3. Heavy vascular  calcifications. 4. These results will be called to the ordering clinician or representative by the Radiologist Assistant, and communication documented in the PACS or [employer]. Electronically Signed   By: Almira Bar M.D.   On: 09/02/2023 07:52   DG Abd Portable 1V Result Date: 09/02/2023 CLINICAL DATA:  [id number].  Check NGT positioning. EXAM: PORTABLE ABDOMEN - 1 VIEW COMPARISON:  Portable abdomen yesterday at 8:20 p.m., CT with IV contrast 08/31/2023. FINDINGS: 12:21 a.m. The tip of the NGT is in the midthoracic esophagus just above the level of the carina and needs to be advanced back into the stomach. Dilated gas distention in the redundant sigmoid colon is again present in the upper abdomen, appears similar. Maximum caliber 7.4 cm. No dilated small bowel segments are seen. In the chest, there is a left chest dual lead pacing system with stable wire insertions, cardiomegaly and mild central vessel prominence, elevated right diaphragm and linear atelectasis in both bases. Calcified lymph node clusters are again noted in the mediastinum in the azygous region and AP window. The aorta is tortuous and calcified with stable visualized mediastinum. No new osseous findings. IMPRESSION: 1. The tip of the NGT is in the midthoracic esophagus just above the level of the carina and needs to be advanced back into the stomach. 2. Dilated gas distention in the redundant sigmoid colon appears similar. 3. Cardiomegaly and mild central vessel prominence. 4. Aortic atherosclerosis. 5. Stable calcified mediastinal lymph nodes. Electronically Signed   By:  Almira Bar M.D.   On: 09/02/2023 00:46   DG Abd Portable 1V Result Date: 09/01/2023 CLINICAL DATA:  NG tube placement EXAM: PORTABLE ABDOMEN - 1 VIEW COMPARISON:  09/01/2023 FINDINGS: NG tube tip is in the fundus of the stomach. Elevation of the right hemidiaphragm. Gaseous distention of the visualized colon in the upper abdomen. IMPRESSION: NG tube tip in the stomach. Electronically Signed   By: Charlett Nose M.D.   On: 09/01/2023 21:01   DG Abd 1 View Result Date: 09/01/2023 CLINICAL DATA:  Ileus, abdominal pain EXAM: ABDOMEN - 1 VIEW COMPARISON:  08/31/2023 FINDINGS: Three supine frontal views of the abdomen and pelvis are obtained. There is persistent gaseous distension of the large and small bowel, with the colon measuring up to 11 cm in diameter. Moderate retained stool throughout the colon again noted peer in there are no masses or abnormal calcifications. No acute bony abnormalities. IMPRESSION: 1. Continued gaseous distension of the large and small bowel, with large amount of retained stool within the colon. Findings are compatible with ileus versus distal obstruction. Electronically Signed   By: Sharlet Salina M.D.   On: 09/01/2023 19:01   CT ABDOMEN PELVIS W CONTRAST Result Date: 08/31/2023 CLINICAL DATA:  Acute nonlocalized abdominal pain. Abdominal distension EXAM: CT ABDOMEN AND PELVIS WITH CONTRAST TECHNIQUE: Multidetector CT imaging of the abdomen and pelvis was performed using the standard protocol following bolus administration of intravenous contrast. RADIATION DOSE REDUCTION: This exam was performed according to the departmental dose-optimization program which includes automated exposure control, adjustment of the mA and/or kV according to patient size and/or use of iterative reconstruction technique. CONTRAST:  OMNIPAQUE IOHEXOL 300 MG/ML  SOLN COMPARISON:  11/01/2022 FINDINGS: Lower chest: No acute abnormality. Hepatobiliary: Small left hepatic lobe cyst is unchanged.  Unremarkable gallbladder and biliary tree. Pancreas: Unremarkable. Spleen: Unremarkable. Adrenals/Urinary Tract: Normal adrenal glands. Suprapubic catheter in the nondistended bladder. Left nephrectomy. No urinary calculi or hydronephrosis in the right kidney or ureter. Stomach/Bowel: Gaseous dilation of  the redundant sigmoid colon measuring up to 7.8 cm in diameter. No evidence of volvulus. There is tapering to the distal sigmoid colon where there is mild wall thickening (series 5/image 43). Additional wall thickening in the distal sigmoid colon and rectum. Moderate stool in the colon upstream from the sigmoid colon. Stomach is within normal limits. Appendix is not visualized. Vascular/Lymphatic: Aortic atherosclerosis. No enlarged abdominal or pelvic lymph nodes. Reproductive: No acute abnormality. Other: No free intraperitoneal fluid or air.  No abscess. Musculoskeletal: Left THA.  No acute fracture. IMPRESSION: 1. Gaseous dilation of the redundant sigmoid colon measuring up to 7.8 cm in diameter. There is tapering to the sigmoid colon where there is mild wall thickening. Findings favor ileus secondary to proctocolitis. 2. Suprapubic catheter in the nondistended bladder. 3. Aortic Atherosclerosis (ICD10-I70.0). Electronically Signed   By: Minerva Fester M.D.   On: 08/31/2023 19:30      Impression / Plan:   Assessment: Principal Problem:   Proctocolitis Active Problems:   Essential hypertension   Neurogenic bladder   Chronic diastolic CHF (congestive heart failure) (HCC)   Iron deficiency anemia due to chronic blood loss   Dyslipidemia   Hypokalemia   History of stroke   Depression   Ileus (HCC)   Crewe Heathman. is a 83 y.o. y/o male with gaseous dilation of redundant sigmoid colon that has increased in size over the last few days.  Radiology suggested this was ileus secondary to proctocolitis.  Plan:  I spoke to the patient's daughter and the patient about the procedure.  The patient  will be brought to the endoscopy unit today for a flexible sigmoidoscopy to look at the area of inflammation and assess for possible narrowing causing the sigmoid dilatation.  The patient's daughter has been explained the procedure and plan and agrees with proceeding with the procedure.  Thank you for involving me in the care of this patient.      LOS: 1 day   Midge Minium, MD, MD. Clementeen Graham 09/02/2023, 11:51 AM,  Pager [phone] 7am-5pm  Check AMION for 5pm -7am coverage and on weekends   Note: This dictation was prepared with Dragon dictation along with smaller phrase technology. Any transcriptional errors that result from this process are unintentional.   Right

## 2023-09-03 ENCOUNTER — Inpatient Hospital Stay: Payer: Medicare (Managed Care)

## 2023-09-03 ENCOUNTER — Encounter: Payer: Self-pay | Admitting: Gastroenterology

## 2023-09-03 DIAGNOSIS — K5939 Other megacolon: Secondary | ICD-10-CM | POA: Diagnosis not present

## 2023-09-03 DIAGNOSIS — K529 Noninfective gastroenteritis and colitis, unspecified: Secondary | ICD-10-CM | POA: Diagnosis not present

## 2023-09-03 DIAGNOSIS — T83098A Other mechanical complication of other indwelling urethral catheter, initial encounter: Secondary | ICD-10-CM

## 2023-09-03 DIAGNOSIS — R339 Retention of urine, unspecified: Secondary | ICD-10-CM | POA: Diagnosis not present

## 2023-09-03 LAB — COMPREHENSIVE METABOLIC PANEL WITH GFR
ALT: 11 U/L (ref 0–44)
AST: 13 U/L — ABNORMAL LOW (ref 15–41)
Albumin: 2.7 g/dL — ABNORMAL LOW (ref 3.5–5.0)
Alkaline Phosphatase: 57 U/L (ref 38–126)
Anion gap: 10 (ref 5–15)
BUN: 15 mg/dL (ref 8–23)
CO2: 20 mmol/L — ABNORMAL LOW (ref 22–32)
Calcium: 8.3 mg/dL — ABNORMAL LOW (ref 8.9–10.3)
Chloride: 108 mmol/L (ref 98–111)
Creatinine, Ser: 0.85 mg/dL (ref 0.61–1.24)
GFR, Estimated: >60 mL/min (ref >60)
Glucose, Bld: 90 mg/dL (ref 70–99)
Potassium: 4 mmol/L (ref 3.5–5.1)
Sodium: 138 mmol/L (ref 135–145)
Total Bilirubin: 1.2 mg/dL (ref 0.0–1.2)
Total Protein: 5.7 g/dL — ABNORMAL LOW (ref 6.5–8.1)

## 2023-09-03 LAB — CBC WITH DIFFERENTIAL/PLATELET
Abs Immature Granulocytes: 0.03 10*3/uL (ref 0.00–0.07)
Basophils Absolute: 0 10*3/uL (ref 0.0–0.1)
Basophils Relative: 0 %
Eosinophils Absolute: 0 10*3/uL (ref 0.0–0.5)
Eosinophils Relative: 0 %
HCT: 33.4 % — ABNORMAL LOW (ref 39.0–52.0)
Hemoglobin: 10.6 g/dL — ABNORMAL LOW (ref 13.0–17.0)
Immature Granulocytes: 1 %
Lymphocytes Relative: 17 %
Lymphs Abs: 1 10*3/uL (ref 0.7–4.0)
MCH: 25.1 pg — ABNORMAL LOW (ref 26.0–34.0)
MCHC: 31.7 g/dL (ref 30.0–36.0)
MCV: 79 fL — ABNORMAL LOW (ref 80.0–100.0)
Monocytes Absolute: 0.2 10*3/uL (ref 0.1–1.0)
Monocytes Relative: 3 %
Neutro Abs: 4.6 10*3/uL (ref 1.7–7.7)
Neutrophils Relative %: 79 %
Platelets: 196 10*3/uL (ref 150–400)
RBC: 4.23 MIL/uL (ref 4.22–5.81)
RDW: 17.7 % — ABNORMAL HIGH (ref 11.5–15.5)
WBC: 5.9 10*3/uL (ref 4.0–10.5)
nRBC: 0 % (ref 0.0–0.2)

## 2023-09-03 LAB — PHOSPHORUS: Phosphorus: 4.2 mg/dL (ref 2.5–4.6)

## 2023-09-03 LAB — MAGNESIUM: Magnesium: 2.1 mg/dL (ref 1.7–2.4)

## 2023-09-03 LAB — GLUCOSE, CAPILLARY: Glucose-Capillary: 90 mg/dL (ref 70–99)

## 2023-09-03 MED ORDER — LACTULOSE 10 GM/15ML PO SOLN
20.0000 g | Freq: Three times a day (TID) | ORAL | Status: DC
  Administered 2023-09-03 – 2023-09-04 (×5): 20 g
  Filled 2023-09-03 (×5): qty 30

## 2023-09-03 MED ORDER — POLYETHYLENE GLYCOL 3350 17 GM/SCOOP PO POWD: 17.0000 g | Freq: Two times a day (BID) | ORAL | Status: DC

## 2023-09-03 MED ORDER — CEFAZOLIN SODIUM-DEXTROSE 1-4 GM/50ML-% IV SOLN
1.0000 g | Freq: Three times a day (TID) | INTRAVENOUS | Status: DC
  Administered 2023-09-03 – 2023-09-06 (×10): 1 g via INTRAVENOUS
  Filled 2023-09-03 (×11): qty 50

## 2023-09-03 NOTE — Progress Notes (Addendum)
 PROGRESS NOTE    Adam Rios.  ZHY:[id number] DOB: 1940-12-22 DOA: 08/31/2023 PCP: Laqueta Due, MD  Chief Complaint  Patient presents with   Abdominal Pain    Hospital Course:  Adam Rios. is 83 y.o. male with dementia, hypertension, hyperlipidemia, diastolic CHF, stroke, dementia, BPH, depression with anxiety, iron deficiency anemia, renal cell carcinoma, neurogenic bladder, suprapubic catheter, A-fib not on anticoagulation, who presents with abdominal pain.  Patient has had abdominal pain and distention for the last 2 days with some nausea though no vomiting.  He has had clear mucus in his stool.  In the ED labs and vitals are mostly unremarkable.  CT abdomen pelvis reveals gaseous dilation of the redundant sigmoid colon measuring 7.8 cm in diameter with tapering to the sigmoid colon where there is mild wall thickening.  Findings favor ileus secondary to proctocolitis.  Subjective: Patient still with lower abdominal pain today.  Bedside RN also reporting that his suprapubic catheter is no longer having output despite over 400 cc on bladder scan.  She is able to flush the tube but is not getting any return.  She also endorses some concern that there appears to be urinary drainage from his rectum.  The patient is uncomfortable.  He has pulled out his NG tube twice.  He is currently in wrist restraints due to this.  Objective: Vitals:   09/02/23 1801 09/02/23 1815 09/02/23 1926 09/03/23 0742  BP: 128/78 120/74 (!) 146/88 117/67  Pulse: 90 88 67 72  Resp: 17 18 16 18   Temp:  98.6 F (37 C) 98.1 F (36.7 C) 98 F (36.7 C)  TempSrc:  Oral    SpO2: 95% 96% 95% 94%  Weight:      Height:       No intake or output data in the 24 hours ending 09/03/23 0942  Filed Weights   08/31/23 1508 09/02/23 0427 09/02/23 1646  Weight: 90.7 kg 86.6 kg 86.6 kg    Examination: General exam: Appears calm and comfortable, NAD  Respiratory system: No work of breathing, symmetric chest  wall expansion Cardiovascular system: S1 & S2 heard, RRR.  Gastrointestinal system: Abdomen is distended but soft, bowel sounds present. Neuro: Alert, intermittently disoriented today.  Aware that he is in the hospital but confused as to the events prior to this. Extremities: Symmetric, expected ROM Skin: No rashes, lesions Psychiatry: Calm and redirectable.  Assessment & Plan:  Principal Problem:   Proctocolitis Active Problems:   Chronic diastolic CHF (congestive heart failure) (HCC)   Iron deficiency anemia due to chronic blood loss   Essential hypertension   Dyslipidemia   Hypokalemia   History of stroke   Neurogenic bladder   Depression   Ileus (HCC)    Ileus/sigmoid colon distention possible obstruction - Sigmoid colon measuring 7.8 cm in diameter on CT. progressively worsening on KUB. - NG tube placed on 4/2 with no improvement, patient has pulled out NG tube multiple times.  Currently with wrist restraints and on intermittent suction.  He is tolerating meds p.o. - Consulted gastroenterology.  Patient underwent flex sigmoidoscopy which was unremarkable on 4/4. -- GI has signed off - Distention is improving on repeat KUB this morning.  Will continue with aggressive bowel regimen.  Patient is consistently having gas and bowel movements we will remove NG tube.  Hopefully tomorrow. - Initial CT concerning for proctocolitis.  Was initiated on Zosyn for this, no colitis seen on sigmoidoscopy with GI.  Will de-escalate antibiotics for UTI  coverage only -Keep n.p.o. for now given concerns of suprapubic catheter dysfunction and possible urologic intervention. - Avoid opioids if possible - Cont MIVF  Chronic diastolic CHF - Echo May 2024: EF 55 to 60% - Euvolemic at this time.  Continue to monitor volume status closely considering his IV fluids. - Continue to hold furosemide and spironolactone as he is NPO.  IV Lasix as needed  Suprapubic catheter dysfunction - Long-term  catheter secondary to neurogenic bladder. - Catheter no longer having output.  Bedside RN endorsing concerns for urinary drainage the rectum.  No evidence of fistula on sigmoidoscopy or CT. - Have consulted urology for their review.  Appreciate recommendations.  Iron deficiency anemia - Hemoglobin stable - Continue to trend CBC   Hypertension - Continue closely - As needed metoprolol and hydralazine  Dyslipidemia - Continue statin   Hypokalemia - Replace as needed  History of CVA Dementia - Resume home meds when able - Continue frequent reorientation, delirium precautions.  Neurogenic bladder Suprapubic catheter Proteus UTI -- De-escalate to cefazolin for 5 more days.  Depression - Continue home meds  DVT prophylaxis: Heparin   Code Status: Full Code Family Communication:  Discussed directly with patient, attempted to reach daughter, Adam Rios, by phone. No answer. Disposition: Still with colonic distention, NGT and now with cath dysfunction  Consultants:    Procedures:    Antimicrobials:  Anti-infectives (From admission, onward)    Start     Dose/Rate Route Frequency Ordered Stop   09/01/23 0500  piperacillin-tazobactam (ZOSYN) IVPB 3.375 g        3.375 g 12.5 mL/hr over 240 Minutes Intravenous Every 8 hours 09/01/23 0137     08/31/23 2145  piperacillin-tazobactam (ZOSYN) IVPB 3.375 g        3.375 g 100 mL/hr over 30 Minutes Intravenous  Once 08/31/23 2134 08/31/23 2249       Data Reviewed: I have personally reviewed following labs and imaging studies CBC: Recent Labs  Lab 08/31/23 1510 09/01/23 0443 09/02/23 0845 09/03/23 0517  WBC 8.9 7.4 7.2 5.9  NEUTROABS  --   --   --  4.6  HGB 11.9* 10.5* 10.4* 10.6*  HCT 38.9* 33.7* 33.5* 33.4*  MCV 81.2 80.6 80.9 79.0*  PLT 214 184 189 196   Basic Metabolic Panel: Recent Labs  Lab 08/31/23 1510 09/01/23 0443 09/02/23 0845 09/03/23 0517  NA 138 140 140 138  K 3.3* 3.1* 2.9* 4.0  CL 104 106 108 108   CO2 24 27 25  20*  GLUCOSE 122* 95 71 90  BUN 21 19 17 15   CREATININE 0.89 0.96 0.83 0.85  CALCIUM 8.7* 8.4* 8.2* 8.3*  MG 2.3  --   --  2.1  PHOS  --   --   --  4.2   GFR: Estimated Creatinine Clearance: 74.4 mL/min (by C-G formula based on SCr of 0.85 mg/dL). Liver Function Tests: Recent Labs  Lab 08/31/23 1510 09/02/23 0845 09/03/23 0517  AST 17 14* 13*  ALT 12 10 11   ALKPHOS 79 60 57  BILITOT 0.6 0.7 1.2  PROT 6.7 5.9* 5.7*  ALBUMIN 3.2* 2.7* 2.7*   CBG: Recent Labs  Lab 09/02/23 0737  GLUCAP 71    Recent Results (from the past 240 hours)  Urine Culture (for pregnant, neutropenic or urologic patients or patients with an indwelling urinary catheter)     Status: Abnormal   Collection Time: 08/31/23  3:10 PM   Specimen: Urine, Random  Result Value Ref Range Status  Specimen Description   Final    URINE, RANDOM Performed at [facility], [address] [place], [place] [address]    Special Requests   Final    NONE Performed at [facility], [address] [place], [place] [address]    Culture >=100,000 COLONIES/mL PROTEUS MIRABILIS (A)  Final   Report Status 09/02/2023 FINAL  Final   Organism ID, Bacteria PROTEUS MIRABILIS (A)  Final      Susceptibility   Proteus mirabilis - MIC*    AMPICILLIN <=2 SENSITIVE Sensitive     CEFAZOLIN 8 SENSITIVE Sensitive     CEFEPIME <=0.12 SENSITIVE Sensitive     CEFTRIAXONE <=0.25 SENSITIVE Sensitive     CIPROFLOXACIN >=4 RESISTANT Resistant     GENTAMICIN <=1 SENSITIVE Sensitive     IMIPENEM 2 SENSITIVE Sensitive     NITROFURANTOIN 128 RESISTANT Resistant     TRIMETH/SULFA <=20 SENSITIVE Sensitive     AMPICILLIN/SULBACTAM <=2 SENSITIVE Sensitive     PIP/TAZO <=4 SENSITIVE Sensitive ug/mL    * >=100,000 COLONIES/mL PROTEUS MIRABILIS  Culture, blood (Routine X 2) w Reflex to ID Panel     Status: None (Preliminary result)   Collection Time: 08/31/23 10:50 PM   Specimen: BLOOD  Result Value Ref Range  Status   Specimen Description BLOOD LEFT FOREARM  Final   Special Requests   Final    BOTTLES DRAWN AEROBIC ONLY Blood Culture results may not be optimal due to an inadequate volume of blood received in culture bottles   Culture   Final    NO GROWTH 3 DAYS Performed at [facility], [address], [place], [place] [address]    Report Status PENDING  Incomplete  Culture, blood (Routine X 2) w Reflex to ID Panel     Status: None (Preliminary result)   Collection Time: 08/31/23 10:50 PM   Specimen: BLOOD  Result Value Ref Range Status   Specimen Description BLOOD LEFT ASSIST CONTROL  Final   Special Requests   Final    BOTTLES DRAWN AEROBIC ONLY Blood Culture results may not be optimal due to an inadequate volume of blood received in culture bottles   Culture   Final    NO GROWTH 3 DAYS Performed at [facility], [address] [place], [place] [address]    Report Status PENDING  Incomplete  C Difficile Quick Screen w PCR reflex     Status: None   Collection Time: 09/01/23  1:51 PM   Specimen: STOOL  Result Value Ref Range Status   C Diff antigen NEGATIVE NEGATIVE Final   C Diff toxin NEGATIVE NEGATIVE Final   C Diff interpretation No C. difficile detected.  Final    Comment: Performed at [facility], [address], [place], [place] [address]     Radiology Studies: DG Abd 1 View Result Date: 09/03/2023 CLINICAL DATA:  Colonic obstruction. EXAM: ABDOMEN - 1 VIEW COMPARISON:  September 02, 2023. FINDINGS: Phleboliths are noted in the pelvis. Sigmoid colon dilatation is decreased compared to prior exam, now with maximum measured diameter of 8.6 cm. No other bowel dilatation is noted. Moderate amount of stool is seen in right and left colon and rectum. IMPRESSION: Moderate stool burden. Decreased dilatation of sigmoid colon compared to prior exam. No other bowel dilatation is noted. Electronically Signed   By: Lupita Raider M.D.   On: 09/03/2023 09:02   DG  Abd 1 View Result Date: 09/02/2023 CLINICAL DATA:  [id number].  Ileus. EXAM: ABDOMEN - 1 VIEW COMPARISON:  CT 08/31/2023, abdomen film yesterday at 12:21 a.m. FINDINGS: There is worsening gas dilatation of the redundant sigmoid colon. Maximum distension is 11 cm, previously 9.2 cm. Correlate clinically to exclude worsening sigmoid obstruction. CT may be helpful. There is no appreciable small bowel dilatation. Moderate retained stool occupies the ascending, transverse and descending colon. There is no supine evidence of free air. Heavy vascular calcifications with no other significant calcifications. The bladder is catheterized and contains faint contrast. There is osteopenia, degenerative changes of the spine and a left hip arthroplasty. IMPRESSION: 1. Worsening gas dilatation of the redundant sigmoid colon. Correlate clinically to exclude worsening sigmoid obstruction. CT may be helpful. 2. Moderate retained stool in the ascending, transverse and descending colon. 3. Heavy vascular calcifications. 4. These results will be called to the ordering clinician or representative by the Radiologist Assistant, and communication documented in the PACS or [employer]. Electronically Signed   By: Almira Bar M.D.   On: 09/02/2023 07:52   DG Abd Portable 1V Result Date: 09/02/2023 CLINICAL DATA:  [id number].  Check NGT positioning. EXAM: PORTABLE ABDOMEN - 1 VIEW COMPARISON:  Portable abdomen yesterday at 8:20 p.m., CT with IV contrast 08/31/2023. FINDINGS: 12:21 a.m. The tip of the NGT is in the midthoracic esophagus just above the level of the carina and needs to be advanced back into the stomach. Dilated gas distention in the redundant sigmoid colon is again present in the upper abdomen, appears similar. Maximum caliber 7.4 cm. No dilated small bowel segments are seen. In the chest, there is a left chest dual lead pacing system with stable wire insertions, cardiomegaly and mild central vessel prominence, elevated right  diaphragm and linear atelectasis in both bases. Calcified lymph node clusters are again noted in the mediastinum in the azygous region and AP window. The aorta is tortuous and calcified with stable visualized mediastinum. No new osseous findings. IMPRESSION: 1. The tip of the NGT is in the midthoracic esophagus just above the level of the carina and needs to be advanced back into the stomach. 2. Dilated gas distention in the redundant sigmoid colon appears similar. 3. Cardiomegaly and mild central vessel prominence. 4. Aortic atherosclerosis. 5. Stable calcified mediastinal lymph nodes. Electronically Signed   By: Almira Bar M.D.   On: 09/02/2023 00:46   DG Abd Portable 1V Result Date: 09/01/2023 CLINICAL DATA:  NG tube placement EXAM: PORTABLE ABDOMEN - 1 VIEW COMPARISON:  09/01/2023 FINDINGS: NG tube tip is in the fundus of the stomach. Elevation of the right hemidiaphragm. Gaseous distention of the visualized colon in the upper abdomen. IMPRESSION: NG tube tip in the stomach. Electronically Signed   By: Charlett Nose M.D.   On: 09/01/2023 21:01   DG Abd 1 View Result Date: 09/01/2023 CLINICAL DATA:  Ileus, abdominal pain EXAM: ABDOMEN - 1 VIEW COMPARISON:  08/31/2023 FINDINGS: Three supine frontal views of the abdomen and pelvis are obtained. There is persistent gaseous distension of the large and small bowel, with the colon measuring up to 11 cm in diameter. Moderate retained stool throughout the colon again noted peer in there are no masses or abnormal calcifications. No acute bony abnormalities. IMPRESSION: 1. Continued gaseous distension of the large and small bowel, with large amount of retained stool within the colon. Findings are compatible with ileus versus distal obstruction. Electronically Signed   By: Sharlet Salina M.D.   On: 09/01/2023 19:01    Scheduled Meds:  atorvastatin  40 mg  Oral Daily   cyanocobalamin  1,000 mcg Oral Daily   fesoterodine  4 mg Oral Daily   gabapentin  600 mg Oral  BID   heparin  5,000 Units Subcutaneous Q8H   iron polysaccharides  150 mg Oral Daily   metoprolol succinate  12.5 mg Oral Daily   mirabegron ER  25 mg Oral Daily   pantoprazole  20 mg Oral Daily   sertraline  50 mg Oral Daily   tamsulosin  0.4 mg Oral Daily   Continuous Infusions:  piperacillin-tazobactam (ZOSYN)  IV 3.375 g (09/03/23 0844)     LOS: 2 days  MDM: Patient is high risk for one or more organ failure.  They necessitate ongoing hospitalization for continued IV therapies and subsequent lab monitoring. Total time spent interpreting labs and vitals, coordinating care amongst consultants and care team members, directly assessing and discussing care with the patient and/or family: 55 min   Debarah Crape, DO [facility]  To contact the attending physician between 7A-7P please use Epic Chat. To contact the covering physician during after hours 7P-7A, please review Amion.   09/03/2023, 9:42 AM   *This document has been created with the assistance of dictation software. Please excuse typographical errors. *

## 2023-09-03 NOTE — [place] FL2 (Signed)
 [place] MEDICAID FL2 LEVEL OF CARE FORM     IDENTIFICATION  Patient Name: Adam Rios. Birthdate: 1940-09-02 Sex: male Admission Date (Current Location): 08/31/2023  [facility] and [place] Number:  Chiropodist and Address:  [facility], [address], [place], [place] [address]      Provider Number: [id number]  Attending Physician Name and Address:  Debarah Crape, DO  Relative Name and Phone Number:  XZAVIAR, MALOOF (Daughter)  [phone] (Mobile)    Current Level of Care: Hospital Recommended Level of Care: Skilled Nursing Facility Prior Approval Number:    Date Approved/Denied:   PASRR Number: [id number]  Discharge Plan:      Current Diagnoses: Patient Active Problem List   Diagnosis Date Noted   Ileus (HCC) 09/01/2023   Proctocolitis 08/31/2023   Suprapubic catheter dysfunction, initial encounter (HCC) 06/14/2023   Depression 06/14/2023   Acute postoperative anemia due to expected blood loss 11/29/2022   Overweight (BMI 25.0-29.9) 11/28/2022   Hyponatremia 11/28/2022   Metabolic acidosis 11/28/2022   Closed left hip fracture (HCC) 11/27/2022   Known medical problems 11/27/2022   Acute metabolic encephalopathy 09/29/2022   Atrial fibrillation with RVR (HCC) 09/28/2022   PAD (peripheral artery disease) (HCC) 09/28/2022   History of stroke 09/28/2022   Sepsis secondary to UTI (HCC) 09/28/2022   Generalized weakness 08/23/2022   Hypokalemia 08/14/2022   AKI (acute kidney injury) (HCC) 08/09/2022   Leukocytosis 08/09/2022   Severe sepsis (HCC) 08/09/2022   Heart failure with preserved ejection fraction (HCC) 08/09/2022   Acute hemorrhagic cystitis 06/11/2022   Iron deficiency anemia due to chronic blood loss 06/11/2022   Dyslipidemia 06/11/2022   GERD without esophagitis 06/11/2022   Lower extremity cellulitis 06/11/2022   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   Second degree heart block 05/02/2022   Acute on  chronic congestive heart failure (HCC) 04/30/2022   Chronic diastolic CHF (congestive heart failure) (HCC) 04/30/2022   Obesity (BMI 30-39.9) 04/30/2022   Chronic indwelling Foley catheter 04/30/2022   Septic shock (HCC) 02/11/2022   Pressure injury of skin 02/11/2022   Bacteremia 11/22/2021   Hematuria 11/21/2021   Renal cell carcinoma s/p nephrectomy, left 11/21/2021   History of recurrent UTI and ESBL UTI  11/21/2021   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   UTI (urinary tract infection) 09/09/2021   Neuropathy 09/09/2021   History of CVA (cerebrovascular accident) 09/09/2021   Fall 05/14/2021   Frailty 03/04/2018   Acute encephalopathy 01/30/2018   GERD (gastroesophageal reflux disease) 04/05/2017   Pain 04/05/2017   Weakness 04/05/2017   Recurrent UTI 01/20/2016   Essential hypertension 07/31/2014   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 04/10/2014   Ventral hernia without obstruction or gangrene 04/10/2014   Acute low back pain without sciatica 12/26/2013   Fusion of spine of lumbar region 12/26/2013   Anasarca 09/26/2013   Hemorrhoid 09/26/2013   Neurogenic bladder 09/26/2013   Renal cell cancer (HCC) 09/26/2013   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   Lower urinary tract infectious disease 10/28/2012    Orientation RESPIRATION BLADDER Height & Weight     Self  Normal Incontinent, External catheter Weight: 190 lb 14.7 oz (86.6 kg) Height:  6\' 1"  (185.4 cm)  BEHAVIORAL SYMPTOMS/MOOD NEUROLOGICAL BOWEL NUTRITION STATUS      Incontinent Diet, NG/panda  AMBULATORY STATUS COMMUNICATION OF NEEDS Skin   Limited Assist Verbally Skin abrasions  Personal Care Assistance Level of Assistance  Bathing, Feeding, Dressing Bathing Assistance: Limited assistance Feeding assistance: Limited assistance Dressing Assistance: Limited assistance     Functional Limitations Info             SPECIAL CARE FACTORS FREQUENCY                        Contractures      Additional Factors Info  Code Status, Allergies Code Status Info: full Allergies Info: Empagliflozin, Fentanyl, Oxycodone           Current Medications (09/03/2023):  This is the current hospital active medication list Current Facility-Administered Medications  Medication Dose Route Frequency Provider Last Rate Last Admin   acetaminophen (TYLENOL) tablet 650 mg  650 mg Oral Q6H PRN Lorretta Harp, MD       atorvastatin (LIPITOR) tablet 40 mg  40 mg Oral Daily Lorretta Harp, MD   40 mg at 09/02/23 1610   ceFAZolin (ANCEF) IVPB 1 g/50 mL premix  1 g Intravenous Q8H Dezii, Alexandra, DO       cyanocobalamin (VITAMIN B12) tablet 1,000 mcg  1,000 mcg Oral Daily Lorretta Harp, MD   1,000 mcg at 09/02/23 9604   fesoterodine (TOVIAZ) tablet 4 mg  4 mg Oral Daily Lorretta Harp, MD   4 mg at 09/02/23 5409   gabapentin (NEURONTIN) capsule 600 mg  600 mg Oral BID Lorretta Harp, MD   600 mg at 09/02/23 2143   haloperidol lactate (HALDOL) injection 1-2 mg  1-2 mg Intramuscular Q6H PRN Mansy, Jan A, MD       heparin injection 5,000 Units  5,000 Units Subcutaneous Q8H Lorretta Harp, MD   5,000 Units at 09/03/23 8119   hydrALAZINE (APRESOLINE) injection 5 mg  5 mg Intravenous Q2H PRN Lorretta Harp, MD       iron polysaccharides (NIFEREX) capsule 150 mg  150 mg Oral Daily Lorretta Harp, MD   150 mg at 09/02/23 0925   lactulose (CHRONULAC) 10 GM/15ML solution 20 g  20 g Per Tube TID Debarah Crape, DO       LORazepam (ATIVAN) injection 0.5 mg  0.5 mg Intravenous Q4H PRN Mansy, Jan A, MD   0.5 mg at 09/02/23 1478   metoprolol succinate (TOPROL-XL) 24 hr tablet 12.5 mg  12.5 mg Oral Daily Lorretta Harp, MD   12.5 mg at 09/02/23 0925   mirabegron ER (MYRBETRIQ) tablet 25 mg  25 mg Oral Daily Lorretta Harp, MD   25 mg at 09/02/23 0926   morphine (PF) 2 MG/ML injection 2 mg  2 mg Intravenous Q4H PRN Mansy, Jan A, MD   1 mg at 09/01/23 2048   ondansetron (ZOFRAN) injection 4 mg  4 mg Intravenous Q8H PRN Lorretta Harp, MD       pantoprazole (PROTONIX) EC tablet 20 mg  20 mg Oral Daily Lorretta Harp, MD   20 mg at 09/02/23 2956   sertraline (ZOLOFT) tablet 50 mg  50 mg Oral Daily Lorretta Harp, MD   50 mg at 09/02/23 2130   tamsulosin (FLOMAX) capsule 0.4 mg  0.4 mg Oral Daily Lorretta Harp, MD   0.4 mg at 09/02/23 8657   traZODone (DESYREL) tablet 25 mg  25 mg Oral QHS PRN Mansy, Vernetta Honey, MD         Discharge Medications: Please see discharge summary for a list of discharge medications.  Relevant Imaging Results:  Relevant Lab Results:   Additional Information SS #:  [id number]  Rickard Kennerly E Nashawn Hillock, LCSW

## 2023-09-03 NOTE — Consult Note (Signed)
 Urology Consult  I have been asked to see the patient by Dr. Rennis Chris, for evaluation and management of suprapubic catheter malfunction.  Chief Complaint: Abdominal pain  History of Present Illness: Adam Rios. is a 83 y.o. year old male with dementia and a complex urologic history including BPH with urinary retention s/p HOLEP at [facility] in January 2024, now admitted with proctocolitis.  Since we last saw him, a suprapubic catheter was placed.  He is on home Myrbetriq, Vesicare, and Flomax for unclear reasons.  CTAP with contrast dated 08/31/2023 showed a decompressed urinary bladder with suprapubic catheter in appropriate position.  Foley catheter has since stopped draining and is not able to be flushed by nursing.  Nursing reports passage of clear liquid from rectum earlier today, possibly reflecting urine. No evidence of fistula on admission CT, however there is some streak artifact from the hip obscuring view.  He reports malaise today.  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HCC)    Hypertension    Neurogenic bladder    Renal cell carcinoma (HCC) 2005   Renal disorder    Stroke (HCC)     01/17/18, 12/21   Some weakness and cognitive decline    Past Surgical History:  Procedure Laterality Date   APPENDECTOMY     CATARACT EXTRACTION W/PHACO Right 07/22/2020   Procedure: CATARACT EXTRACTION PHACO AND INTRAOCULAR LENS PLACEMENT (IOC) RIGHT;  Surgeon: Nevada Crane, MD;  Location: [facility];  Service: Ophthalmology;  Laterality: Right;  4.77 0:44.2   CATARACT EXTRACTION W/PHACO Left 08/05/2020   Procedure: CATARACT EXTRACTION PHACO AND INTRAOCULAR LENS PLACEMENT (IOC) LEFT 10.73 01:13.8;  Surgeon: Nevada Crane, MD;  Location: [facility];  Service: Ophthalmology;  Laterality: Left;  Use eye stretcher not chair   FLEXIBLE  SIGMOIDOSCOPY N/A 09/02/2023   Procedure: Arnell Sieving;  Surgeon: Midge Minium, MD;  Location: [facility];  Service: Endoscopy;  Laterality: N/A;   HERNIA REPAIR     HIP ARTHROPLASTY Left 11/28/2022   Procedure: ARTHROPLASTY BIPOLAR HIP (HEMIARTHROPLASTY);  Surgeon: Signa Kell, MD;  Location: [facility] ORS;  Service: Orthopedics;  Laterality: Left;   IR CATHETER TUBE CHANGE  06/15/2023   LUMBAR FUSION     NEPHRECTOMY Left 2005   TEMPORARY PACEMAKER N/A 05/03/2022   Procedure: TEMPORARY PACEMAKER;  Surgeon: Alwyn Pea, MD;  Location: [facility] INVASIVE CV LAB;  Service: Cardiovascular;  Laterality: N/A;    Home Medications:  Current Meds  Medication Sig   Acetaminophen Extra Strength 500 MG TABS Take 1,000 mg by mouth 3 (three) times daily as needed (PAIN).   acetic acid 0.25 % irrigation Apply 1 Application topically as needed.   atorvastatin (LIPITOR) 40 MG tablet Take 40 mg by mouth daily.   CALPROTECT 0.44-20.6 % OINT Apply 1 Application topically 2 (two) times daily.   docusate sodium (COLACE) 100 MG capsule Take 1 capsule (100 mg total) by mouth 2 (two) times daily.   finasteride (PROSCAR) 5 MG tablet Take 5 mg by mouth daily.   gabapentin (NEURONTIN) 300 MG capsule Take 2 capsules (600 mg total) by mouth 2 (two) times daily.   hydrocortisone cream ([employer] CORTISONE MAXIMUM STRENGTH) 1 % Apply 1 Application topically as needed for itching.   magnesium oxide (MAG-OX) 400 (240 Mg) MG tablet Take 2 tablets by mouth 2 (two) times daily.   metoCLOPramide (REGLAN)  5 MG tablet Take 1-2 tablets (5-10 mg total) by mouth every 8 (eight) hours as needed for nausea (if ondansetron (ZOFRAN) ineffective.).   metoprolol succinate (TOPROL-XL) 25 MG 24 hr tablet Take 0.5 tablets (12.5 mg total) by mouth daily.   MYRBETRIQ 25 MG TB24 tablet Take 25 mg by mouth daily.   ondansetron (ZOFRAN) 4 MG tablet Take 1 tablet (4 mg total) by mouth every 6 (six) hours as needed for nausea.   pantoprazole  (PROTONIX) 20 MG tablet Take 1 tablet (20 mg total) by mouth daily.   POLY-IRON 150 FORTE 150-25-1 MG-MCG-MG CAPS Take 1 capsule by mouth daily.   Psyllium Fiber 0.52 g CAPS Take 1 capsule by mouth 2 (two) times daily.   senna-docusate (SENOKOT-S) 8.6-50 MG tablet Take 1 tablet by mouth 2 (two) times daily as needed.   sertraline (ZOLOFT) 50 MG tablet Take 50 mg by mouth daily.   solifenacin (VESICARE) 5 MG tablet Take 5 mg by mouth daily.   spironolactone (ALDACTONE) 25 MG tablet Take 25 mg by mouth daily.   tamsulosin (FLOMAX) 0.4 MG CAPS capsule Take 0.4 mg by mouth daily.   torsemide 40 MG TABS Take 40 mg by mouth daily.   traMADol (ULTRAM) 50 MG tablet Take 1 tablet (50 mg total) by mouth every 6 (six) hours as needed for moderate pain (pain score 4-6).   vitamin B-12 (CYANOCOBALAMIN) 1000 MCG tablet Take 1,000 mcg by mouth daily.   Vitamin D, Ergocalciferol, (DRISDOL) 1.25 MG (50000 UNIT) CAPS capsule Take 50,000 Units by mouth every 7 (seven) days.    Allergies:  Allergies  Allergen Reactions   Empagliflozin     Recurrent UTI with sepsis on jardiance.   Fentanyl Nausea Only   Oxycodone Nausea And Vomiting    Family History  Problem Relation Age of Onset   Cerebral aneurysm Father    Cerebral aneurysm Sister     Social History:  reports that he quit smoking about 20 years ago. His smoking use included cigarettes. He started smoking about 30 years ago. He has a 2.5 pack-year smoking history. He has never used smokeless tobacco. He reports that he does not currently use alcohol. He reports that he does not use drugs.  ROS: A complete review of systems was performed.  All systems are negative except for pertinent findings as noted.  Physical Exam:  Vital signs in last 24 hours: Temp:  [95.9 F (35.5 C)-98.6 F (37 C)] 98.2 F (36.8 C) (04/04 1558) Pulse Rate:  [67-90] 82 (04/04 1558) Resp:  [16-19] 18 (04/04 1558) BP: (116-146)/(64-88) 140/83 (04/04 1558) SpO2:  [92 %-96  %] 92 % (04/04 1558) Constitutional:  Alert, no acute distress HEENT:  AT, moist mucus membranes Cardiovascular: No clubbing, cyanosis, or edema Respiratory: Normal respiratory effort GI: Abdomen is distended.  SPT in place with scant drainage and night bag. Skin: No rashes, bruises or suspicious lesions Neurologic: Grossly intact, no focal deficits, moving all 4 extremities Psychiatric: Normal mood and affect  Laboratory Data:  Recent Labs    09/01/23 0443 09/02/23 0845 09/03/23 0517  WBC 7.4 7.2 5.9  HGB 10.5* 10.4* 10.6*  HCT 33.7* 33.5* 33.4*   Recent Labs    09/01/23 0443 09/02/23 0845 09/03/23 0517  NA 140 140 138  K 3.1* 2.9* 4.0  CL 106 108 108  CO2 27 25 20*  GLUCOSE 95 71 90  BUN 19 17 15   CREATININE 0.96 0.83 0.85  CALCIUM 8.4* 8.2* 8.3*   Urinalysis  Component Value Date/Time   COLORURINE YELLOW (A) 08/31/2023 1510   APPEARANCEUR TURBID (A) 08/31/2023 1510   LABSPEC >1.046 (H) 08/31/2023 1510   PHURINE 7.0 08/31/2023 1510   GLUCOSEU NEGATIVE 08/31/2023 1510   HGBUR NEGATIVE 08/31/2023 1510   BILIRUBINUR NEGATIVE 08/31/2023 1510   KETONESUR NEGATIVE 08/31/2023 1510   PROTEINUR 30 (A) 08/31/2023 1510   NITRITE NEGATIVE 08/31/2023 1510   LEUKOCYTESUR SMALL (A) 08/31/2023 1510   Results for orders placed or performed during the hospital encounter of 08/31/23  Urine Culture (for pregnant, neutropenic or urologic patients or patients with an indwelling urinary catheter)     Status: Abnormal   Collection Time: 08/31/23  3:10 PM   Specimen: Urine, Random  Result Value Ref Range Status   Specimen Description   Final    URINE, RANDOM Performed at [facility], [address], [place], [place] [address]    Special Requests   Final    NONE Performed at [facility], [address] [place], [place] [address]    Culture >=100,000 COLONIES/mL PROTEUS MIRABILIS (A)  Final   Report Status 09/02/2023 FINAL  Final   Organism ID,  Bacteria PROTEUS MIRABILIS (A)  Final      Susceptibility   Proteus mirabilis - MIC*    AMPICILLIN <=2 SENSITIVE Sensitive     CEFAZOLIN 8 SENSITIVE Sensitive     CEFEPIME <=0.12 SENSITIVE Sensitive     CEFTRIAXONE <=0.25 SENSITIVE Sensitive     CIPROFLOXACIN >=4 RESISTANT Resistant     GENTAMICIN <=1 SENSITIVE Sensitive     IMIPENEM 2 SENSITIVE Sensitive     NITROFURANTOIN 128 RESISTANT Resistant     TRIMETH/SULFA <=20 SENSITIVE Sensitive     AMPICILLIN/SULBACTAM <=2 SENSITIVE Sensitive     PIP/TAZO <=4 SENSITIVE Sensitive ug/mL    * >=100,000 COLONIES/mL PROTEUS MIRABILIS  Culture, blood (Routine X 2) w Reflex to ID Panel     Status: None (Preliminary result)   Collection Time: 08/31/23 10:50 PM   Specimen: BLOOD  Result Value Ref Range Status   Specimen Description BLOOD LEFT FOREARM  Final   Special Requests   Final    BOTTLES DRAWN AEROBIC ONLY Blood Culture results may not be optimal due to an inadequate volume of blood received in culture bottles   Culture   Final    NO GROWTH 3 DAYS Performed at [facility], [address] [place], [place] [address]    Report Status PENDING  Incomplete  Culture, blood (Routine X 2) w Reflex to ID Panel     Status: None (Preliminary result)   Collection Time: 08/31/23 10:50 PM   Specimen: BLOOD  Result Value Ref Range Status   Specimen Description BLOOD LEFT ASSIST CONTROL  Final   Special Requests   Final    BOTTLES DRAWN AEROBIC ONLY Blood Culture results may not be optimal due to an inadequate volume of blood received in culture bottles   Culture   Final    NO GROWTH 3 DAYS Performed at [facility], [address] [place], [place] [address]    Report Status PENDING  Incomplete  C Difficile Quick Screen w PCR reflex     Status: None   Collection Time: 09/01/23  1:51 PM   Specimen: STOOL  Result Value Ref Range Status   C Diff antigen NEGATIVE NEGATIVE Final   C Diff toxin NEGATIVE NEGATIVE Final   C Diff  interpretation No C. difficile detected.  Final    Comment:  Performed at [facility], [address], [place], [place] [address]    Radiologic Imaging: DG Abd 1 View Result Date: 09/03/2023 CLINICAL DATA:  Colonic obstruction. EXAM: ABDOMEN - 1 VIEW COMPARISON:  September 02, 2023. FINDINGS: Phleboliths are noted in the pelvis. Sigmoid colon dilatation is decreased compared to prior exam, now with maximum measured diameter of 8.6 cm. No other bowel dilatation is noted. Moderate amount of stool is seen in right and left colon and rectum. IMPRESSION: Moderate stool burden. Decreased dilatation of sigmoid colon compared to prior exam. No other bowel dilatation is noted. Electronically Signed   By: Lupita Raider M.D.   On: 09/03/2023 09:02   DG Abd 1 View Result Date: 09/02/2023 CLINICAL DATA:  [id number].  Ileus. EXAM: ABDOMEN - 1 VIEW COMPARISON:  CT 08/31/2023, abdomen film yesterday at 12:21 a.m. FINDINGS: There is worsening gas dilatation of the redundant sigmoid colon. Maximum distension is 11 cm, previously 9.2 cm. Correlate clinically to exclude worsening sigmoid obstruction. CT may be helpful. There is no appreciable small bowel dilatation. Moderate retained stool occupies the ascending, transverse and descending colon. There is no supine evidence of free air. Heavy vascular calcifications with no other significant calcifications. The bladder is catheterized and contains faint contrast. There is osteopenia, degenerative changes of the spine and a left hip arthroplasty. IMPRESSION: 1. Worsening gas dilatation of the redundant sigmoid colon. Correlate clinically to exclude worsening sigmoid obstruction. CT may be helpful. 2. Moderate retained stool in the ascending, transverse and descending colon. 3. Heavy vascular calcifications. 4. These results will be called to the ordering clinician or representative by the Radiologist Assistant, and communication documented in the PACS or [employer].  Electronically Signed   By: Almira Bar M.D.   On: 09/02/2023 07:52   DG Abd Portable 1V Result Date: 09/02/2023 CLINICAL DATA:  [id number].  Check NGT positioning. EXAM: PORTABLE ABDOMEN - 1 VIEW COMPARISON:  Portable abdomen yesterday at 8:20 p.m., CT with IV contrast 08/31/2023. FINDINGS: 12:21 a.m. The tip of the NGT is in the midthoracic esophagus just above the level of the carina and needs to be advanced back into the stomach. Dilated gas distention in the redundant sigmoid colon is again present in the upper abdomen, appears similar. Maximum caliber 7.4 cm. No dilated small bowel segments are seen. In the chest, there is a left chest dual lead pacing system with stable wire insertions, cardiomegaly and mild central vessel prominence, elevated right diaphragm and linear atelectasis in both bases. Calcified lymph node clusters are again noted in the mediastinum in the azygous region and AP window. The aorta is tortuous and calcified with stable visualized mediastinum. No new osseous findings. IMPRESSION: 1. The tip of the NGT is in the midthoracic esophagus just above the level of the carina and needs to be advanced back into the stomach. 2. Dilated gas distention in the redundant sigmoid colon appears similar. 3. Cardiomegaly and mild central vessel prominence. 4. Aortic atherosclerosis. 5. Stable calcified mediastinal lymph nodes. Electronically Signed   By: Almira Bar M.D.   On: 09/02/2023 00:46   DG Abd Portable 1V Result Date: 09/01/2023 CLINICAL DATA:  NG tube placement EXAM: PORTABLE ABDOMEN - 1 VIEW COMPARISON:  09/01/2023 FINDINGS: NG tube tip is in the fundus of the stomach. Elevation of the right hemidiaphragm. Gaseous distention of the visualized colon in the upper abdomen. IMPRESSION: NG tube tip in the stomach. Electronically Signed   By: Charlett Nose M.D.   On: 09/01/2023  21:01   DG Abd 1 View Result Date: 09/01/2023 CLINICAL DATA:  Ileus, abdominal pain EXAM: ABDOMEN - 1 VIEW  COMPARISON:  08/31/2023 FINDINGS: Three supine frontal views of the abdomen and pelvis are obtained. There is persistent gaseous distension of the large and small bowel, with the colon measuring up to 11 cm in diameter. Moderate retained stool throughout the colon again noted peer in there are no masses or abnormal calcifications. No acute bony abnormalities. IMPRESSION: 1. Continued gaseous distension of the large and small bowel, with large amount of retained stool within the colon. Findings are compatible with ileus versus distal obstruction. Electronically Signed   By: Sharlet Salina M.D.   On: 09/01/2023 19:01   Suprapubic Cath Change  Patient is present today for a suprapubic catheter change due to urinary retention.  8ml of water was drained from the balloon, a 18FR foley cath was removed from the tract with some difficulty.  Site was cleaned and prepped in a sterile fashion with betadine.  A 16FR Silastic foley cath was replaced into the tract no complications were noted. Urine return was noted, 10 ml of sterile water was inflated into the balloon and a night bag was attached for drainage.  Patient tolerated well.   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  I exchanged his suprapubic catheter at the bedside, see above.  There is immediate drainage of 500+ cc of clear, yellow urine.  No fecaluria or pneumaturia noted.  Overall, low suspicion for colovesical fistula.  With streak artifact on admission CT, I do not think additional contrast-enhanced imaging will provide further information.  If he continues to have leakage of indeterminate fluid per rectum, would recommend outpatient cystoscopy for further evaluation.  Thank you for involving me in this patient's care, please page with  any further questions or concerns.  Carman Ching, PA-C 09/03/2023 5:09 PM

## 2023-09-03 NOTE — Progress Notes (Signed)
 Midge Minium, MD [facility]   [address] [place], [place] [address] Phone: [phone] Fax : [phone]   Subjective: The patient abdominal distention is much improved today with his KUB showing a moderate stool burden with decreased dilation sigmoid colon compared to the previous exam.  The patient had a flexible sigmoidoscopy with decompression yesterday.   Objective: Vital signs in last 24 hours: Vitals:   09/02/23 1801 09/02/23 1815 09/02/23 1926 09/03/23 0742  BP: 128/78 120/74 (!) 146/88 117/67  Pulse: 90 88 67 72  Resp: 17 18 16 18   Temp:  98.6 F (37 C) 98.1 F (36.7 C) 98 F (36.7 C)  TempSrc:  Oral    SpO2: 95% 96% 95% 94%  Weight:      Height:       Weight change: 0 kg No intake or output data in the 24 hours ending 09/03/23 0911   Exam: Heart:: Regular rate and rhythm or without murmur or extra heart sounds Lungs: normal and clear to auscultation and percussion Abdomen: soft, nontender, normal bowel sounds   Lab Results: @LABTEST2 @ Micro Results: Recent Results (from the past 240 hours)  Urine Culture (for pregnant, neutropenic or urologic patients or patients with an indwelling urinary catheter)     Status: Abnormal   Collection Time: 08/31/23  3:10 PM   Specimen: Urine, Random  Result Value Ref Range Status   Specimen Description   Final    URINE, RANDOM Performed at [facility], [address], [place], [place] [address]    Special Requests   Final    NONE Performed at [facility], [address] [place], [place] [address]    Culture >=100,000 COLONIES/mL PROTEUS MIRABILIS (A)  Final   Report Status 09/02/2023 FINAL  Final   Organism ID, Bacteria PROTEUS MIRABILIS (A)  Final      Susceptibility   Proteus mirabilis - MIC*    AMPICILLIN <=2 SENSITIVE Sensitive     CEFAZOLIN 8 SENSITIVE Sensitive     CEFEPIME <=0.12 SENSITIVE Sensitive     CEFTRIAXONE <=0.25 SENSITIVE Sensitive     CIPROFLOXACIN >=4  RESISTANT Resistant     GENTAMICIN <=1 SENSITIVE Sensitive     IMIPENEM 2 SENSITIVE Sensitive     NITROFURANTOIN 128 RESISTANT Resistant     TRIMETH/SULFA <=20 SENSITIVE Sensitive     AMPICILLIN/SULBACTAM <=2 SENSITIVE Sensitive     PIP/TAZO <=4 SENSITIVE Sensitive ug/mL    * >=100,000 COLONIES/mL PROTEUS MIRABILIS  Culture, blood (Routine X 2) w Reflex to ID Panel     Status: None (Preliminary result)   Collection Time: 08/31/23 10:50 PM   Specimen: BLOOD  Result Value Ref Range Status   Specimen Description BLOOD LEFT FOREARM  Final   Special Requests   Final    BOTTLES DRAWN AEROBIC ONLY Blood Culture results may not be optimal due to an inadequate volume of blood received in culture bottles   Culture   Final    NO GROWTH 3 DAYS Performed at [facility], [address], [place], [place] [address]    Report Status PENDING  Incomplete  Culture, blood (Routine X 2) w Reflex to ID Panel     Status: None (Preliminary result)   Collection Time: 08/31/23 10:50 PM   Specimen: BLOOD  Result Value Ref Range Status   Specimen Description BLOOD LEFT ASSIST CONTROL  Final   Special Requests   Final    BOTTLES DRAWN AEROBIC ONLY Blood Culture results may  not be optimal due to an inadequate volume of blood received in culture bottles   Culture   Final    NO GROWTH 3 DAYS Performed at [facility], [address] [place], [place] [address]    Report Status PENDING  Incomplete  C Difficile Quick Screen w PCR reflex     Status: None   Collection Time: 09/01/23  1:51 PM   Specimen: STOOL  Result Value Ref Range Status   C Diff antigen NEGATIVE NEGATIVE Final   C Diff toxin NEGATIVE NEGATIVE Final   C Diff interpretation No C. difficile detected.  Final    Comment: Performed at [facility], [address] [place], [place] [address]   Studies/Results: [place] Abd 1 View Result Date: 09/03/2023 CLINICAL DATA:  Colonic obstruction. EXAM: ABDOMEN - 1 VIEW COMPARISON:   September 02, 2023. FINDINGS: Phleboliths are noted in the pelvis. Sigmoid colon dilatation is decreased compared to prior exam, now with maximum measured diameter of 8.6 cm. No other bowel dilatation is noted. Moderate amount of stool is seen in right and left colon and rectum. IMPRESSION: Moderate stool burden. Decreased dilatation of sigmoid colon compared to prior exam. No other bowel dilatation is noted. Electronically Signed   By: Lupita Raider M.D.   On: 09/03/2023 09:02   DG Abd 1 View Result Date: 09/02/2023 CLINICAL DATA:  [id number].  Ileus. EXAM: ABDOMEN - 1 VIEW COMPARISON:  CT 08/31/2023, abdomen film yesterday at 12:21 a.m. FINDINGS: There is worsening gas dilatation of the redundant sigmoid colon. Maximum distension is 11 cm, previously 9.2 cm. Correlate clinically to exclude worsening sigmoid obstruction. CT may be helpful. There is no appreciable small bowel dilatation. Moderate retained stool occupies the ascending, transverse and descending colon. There is no supine evidence of free air. Heavy vascular calcifications with no other significant calcifications. The bladder is catheterized and contains faint contrast. There is osteopenia, degenerative changes of the spine and a left hip arthroplasty. IMPRESSION: 1. Worsening gas dilatation of the redundant sigmoid colon. Correlate clinically to exclude worsening sigmoid obstruction. CT may be helpful. 2. Moderate retained stool in the ascending, transverse and descending colon. 3. Heavy vascular calcifications. 4. These results will be called to the ordering clinician or representative by the Radiologist Assistant, and communication documented in the PACS or [employer]. Electronically Signed   By: Almira Bar M.D.   On: 09/02/2023 07:52   DG Abd Portable 1V Result Date: 09/02/2023 CLINICAL DATA:  [id number].  Check NGT positioning. EXAM: PORTABLE ABDOMEN - 1 VIEW COMPARISON:  Portable abdomen yesterday at 8:20 p.m., CT with IV contrast 08/31/2023.  FINDINGS: 12:21 a.m. The tip of the NGT is in the midthoracic esophagus just above the level of the carina and needs to be advanced back into the stomach. Dilated gas distention in the redundant sigmoid colon is again present in the upper abdomen, appears similar. Maximum caliber 7.4 cm. No dilated small bowel segments are seen. In the chest, there is a left chest dual lead pacing system with stable wire insertions, cardiomegaly and mild central vessel prominence, elevated right diaphragm and linear atelectasis in both bases. Calcified lymph node clusters are again noted in the mediastinum in the azygous region and AP window. The aorta is tortuous and calcified with stable visualized mediastinum. No new osseous findings. IMPRESSION: 1. The tip of the NGT is in the midthoracic esophagus just above the level of the carina and needs to be advanced back into the stomach. 2. Dilated  gas distention in the redundant sigmoid colon appears similar. 3. Cardiomegaly and mild central vessel prominence. 4. Aortic atherosclerosis. 5. Stable calcified mediastinal lymph nodes. Electronically Signed   By: Almira Bar M.D.   On: 09/02/2023 00:46   DG Abd Portable 1V Result Date: 09/01/2023 CLINICAL DATA:  NG tube placement EXAM: PORTABLE ABDOMEN - 1 VIEW COMPARISON:  09/01/2023 FINDINGS: NG tube tip is in the fundus of the stomach. Elevation of the right hemidiaphragm. Gaseous distention of the visualized colon in the upper abdomen. IMPRESSION: NG tube tip in the stomach. Electronically Signed   By: Charlett Nose M.D.   On: 09/01/2023 21:01   DG Abd 1 View Result Date: 09/01/2023 CLINICAL DATA:  Ileus, abdominal pain EXAM: ABDOMEN - 1 VIEW COMPARISON:  08/31/2023 FINDINGS: Three supine frontal views of the abdomen and pelvis are obtained. There is persistent gaseous distension of the large and small bowel, with the colon measuring up to 11 cm in diameter. Moderate retained stool throughout the colon again noted peer in there  are no masses or abnormal calcifications. No acute bony abnormalities. IMPRESSION: 1. Continued gaseous distension of the large and small bowel, with large amount of retained stool within the colon. Findings are compatible with ileus versus distal obstruction. Electronically Signed   By: Sharlet Salina M.D.   On: 09/01/2023 19:01   Medications: I have reviewed the patient's current medications. Scheduled Meds:  atorvastatin  40 mg Oral Daily   cyanocobalamin  1,000 mcg Oral Daily   fesoterodine  4 mg Oral Daily   gabapentin  600 mg Oral BID   heparin  5,000 Units Subcutaneous Q8H   iron polysaccharides  150 mg Oral Daily   metoprolol succinate  12.5 mg Oral Daily   mirabegron ER  25 mg Oral Daily   pantoprazole  20 mg Oral Daily   sertraline  50 mg Oral Daily   tamsulosin  0.4 mg Oral Daily   Continuous Infusions:  piperacillin-tazobactam (ZOSYN)  IV 3.375 g (09/03/23 0844)   PRN Meds:.acetaminophen, haloperidol lactate, hydrALAZINE, LORazepam, morphine injection, ondansetron (ZOFRAN) IV, traZODone   Assessment: Principal Problem:   Proctocolitis Active Problems:   Essential hypertension   Neurogenic bladder   Chronic diastolic CHF (congestive heart failure) (HCC)   Iron deficiency anemia due to chronic blood loss   Dyslipidemia   Hypokalemia   History of stroke   Depression   Ileus (HCC)    Plan: The patient appears to be doing better from a sigmoid colon distention point of view.  I would recommend continued laxative use and help discharge his moderate stool burden.  Nothing further to do from a GI point of view.  I will sign off.  Please call if any further GI concerns or questions.  We would like to thank you for the opportunity to participate in the care of Adam Rios..    LOS: 2 days   Midge Minium, MD.FACG 09/03/2023, 9:11 AM Pager [phone] 7am-5pm  Check AMION for 5pm -7am coverage and on weekends

## 2023-09-03 NOTE — TOC Initial Note (Addendum)
 Transition of Care (TOC) - Initial/Assessment Note    Patient Details  Name: Adam Rios. MRN: [id number] Date of Birth: 1940/07/22  Transition of [facility]) CM/SW Contact:    Liliana Cline, LCSW Phone Number: 09/03/2023, 10:06 AM  Clinical Narrative:                 CSW spoke with patient's daughter [name]) and PACE Nurse Zella Ball). Both report patient is from [facility] and the plan is for him to return there when medically ready. Patient currently has a NG tube and has restraints due to pulling it out twice per RN.  Expected Discharge Plan: Skilled Nursing Facility Barriers to Discharge: Continued Medical Work up   Patient Goals and CMS Choice   CMS Medicare.gov Compare Post Acute Care list provided to:: Patient Represenative (must comment) Choice offered to / list presented to : Adult Children      Expected Discharge Plan and Services       Living arrangements for the past 2 months: Skilled Nursing Facility                                      Prior Living Arrangements/Services Living arrangements for the past 2 months: Skilled Nursing Facility Lives with:: Facility Resident Patient language and need for interpreter reviewed:: Yes Do you feel safe going back to the place where you live?: Yes      Need for Family Participation in Patient Care: Yes (Comment) Care giver support system in place?: Yes (comment)   Criminal Activity/Legal Involvement Pertinent to Current Situation/Hospitalization: No - Comment as needed  Activities of Daily Living   ADL Screening (condition at time of admission) Independently performs ADLs?: No Does the patient have a NEW difficulty with bathing/dressing/toileting/self-feeding that is expected to last >3 days?: No Does the patient have a NEW difficulty with getting in/out of bed, walking, or climbing stairs that is expected to last >3 days?: No Does the patient have a NEW difficulty with communication that is expected  to last >3 days?: No Is the patient deaf or have difficulty hearing?: No Does the patient have difficulty seeing, even when wearing glasses/contacts?: No Does the patient have difficulty concentrating, remembering, or making decisions?: No  Permission Sought/Granted Permission sought to share information with : Facility Industrial/product designer granted to share information with : Yes, Verbal Permission Granted (by daughter [name])              Emotional Assessment       Orientation: : Fluctuating Orientation (Suspected and/or reported Sundowners) Alcohol / Substance Use: Not Applicable Psych Involvement: No (comment)  Admission diagnosis:  Proctocolitis [K52.9] Ileus (HCC) [K56.7] Patient Active Problem List   Diagnosis Date Noted   Ileus (HCC) 09/01/2023   Proctocolitis 08/31/2023   Suprapubic catheter dysfunction, initial encounter (HCC) 06/14/2023   Depression 06/14/2023   Acute postoperative anemia due to expected blood loss 11/29/2022   Overweight (BMI 25.0-29.9) 11/28/2022   Hyponatremia 11/28/2022   Metabolic acidosis 11/28/2022   Closed left hip fracture (HCC) 11/27/2022   Known medical problems 11/27/2022   Acute metabolic encephalopathy 09/29/2022   Atrial fibrillation with RVR (HCC) 09/28/2022   PAD (peripheral artery disease) (HCC) 09/28/2022   History of stroke 09/28/2022   Sepsis secondary to UTI (HCC) 09/28/2022   Generalized weakness 08/23/2022   Hypokalemia 08/14/2022   AKI (acute kidney injury) (HCC) 08/09/2022  Leukocytosis 08/09/2022   Severe sepsis (HCC) 08/09/2022   Heart failure with preserved ejection fraction (HCC) 08/09/2022   Acute hemorrhagic cystitis 06/11/2022   Iron deficiency anemia due to chronic blood loss 06/11/2022   Dyslipidemia 06/11/2022   GERD without esophagitis 06/11/2022   Lower extremity cellulitis 06/11/2022   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   Second degree heart block 05/02/2022   Acute on chronic  congestive heart failure (HCC) 04/30/2022   Chronic diastolic CHF (congestive heart failure) (HCC) 04/30/2022   Obesity (BMI 30-39.9) 04/30/2022   Chronic indwelling Foley catheter 04/30/2022   Septic shock (HCC) 02/11/2022   Pressure injury of skin 02/11/2022   Bacteremia 11/22/2021   Hematuria 11/21/2021   Renal cell carcinoma s/p nephrectomy, left 11/21/2021   History of recurrent UTI and ESBL UTI  11/21/2021   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   UTI (urinary tract infection) 09/09/2021   Neuropathy 09/09/2021   History of CVA (cerebrovascular accident) 09/09/2021   Fall 05/14/2021   Frailty 03/04/2018   Acute encephalopathy 01/30/2018   GERD (gastroesophageal reflux disease) 04/05/2017   Pain 04/05/2017   Weakness 04/05/2017   Recurrent UTI 01/20/2016   Essential hypertension 07/31/2014   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 04/10/2014   Ventral hernia without obstruction or gangrene 04/10/2014   Acute low back pain without sciatica 12/26/2013   Fusion of spine of lumbar region 12/26/2013   Anasarca 09/26/2013   Hemorrhoid 09/26/2013   Neurogenic bladder 09/26/2013   Renal cell cancer (HCC) 09/26/2013   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   Lower urinary tract infectious disease 10/28/2012   PCP:  Laqueta Due, MD Pharmacy:   [facility] [phone] - [place], [place] - [address] AT [address] [place] [address] [place] [address] Phone: [phone] Fax: [phone]     Social Drivers of Health (SDOH) Social History: SDOH Screenings   Food Insecurity: No Food Insecurity (09/01/2023)  Housing: Low Risk  (09/01/2023)  Transportation Needs: No Transportation Needs (09/01/2023)  Utilities: Not At Risk (09/01/2023)  Financial Resource Strain: Low Risk  (05/04/2022)   Received from [facility] [facility]  Physical Activity: Inactive (03/29/2018)   Received from [facility]  Social Connections:  Socially Isolated (09/01/2023)  Stress: No Stress Concern Present (03/29/2018)   [facility] from [facility], [facility]  Tobacco Use: Medium Risk (09/02/2023)  Health Literacy: Low Risk  (05/24/2023)   Received from [facility]   SDOH Interventions:     Readmission Risk Interventions    08/11/2022    4:53 PM 06/11/2022    2:19 PM 02/12/2022    2:17 PM  Readmission Risk Prevention Plan  Transportation Screening Complete Complete Complete  PCP or Specialist Appt within 3-5 Days  Complete Complete  HRI or Home Care Consult  Complete Complete  Social Work Consult for Recovery Care Planning/Counseling  Complete Complete  Palliative Care Screening  Not Applicable Not Applicable  Medication Review Oceanographer) Complete Complete Referral to Pharmacy  [facility] or Home Care Consult Complete    Skilled Nursing Facility Complete

## 2023-09-03 NOTE — Plan of Care (Signed)

## 2023-09-04 DIAGNOSIS — D500 Iron deficiency anemia secondary to blood loss (chronic): Secondary | ICD-10-CM | POA: Diagnosis not present

## 2023-09-04 DIAGNOSIS — I5032 Chronic diastolic (congestive) heart failure: Secondary | ICD-10-CM | POA: Diagnosis not present

## 2023-09-04 DIAGNOSIS — K529 Noninfective gastroenteritis and colitis, unspecified: Secondary | ICD-10-CM | POA: Diagnosis not present

## 2023-09-04 DIAGNOSIS — F32A Depression, unspecified: Secondary | ICD-10-CM | POA: Diagnosis not present

## 2023-09-04 LAB — CBC WITH DIFFERENTIAL/PLATELET
Abs Immature Granulocytes: 0.03 10*3/uL (ref 0.00–0.07)
Basophils Absolute: 0 10*3/uL (ref 0.0–0.1)
Basophils Relative: 0 %
Eosinophils Absolute: 0.1 10*3/uL (ref 0.0–0.5)
Eosinophils Relative: 1 %
HCT: 32.2 % — ABNORMAL LOW (ref 39.0–52.0)
Hemoglobin: 10.2 g/dL — ABNORMAL LOW (ref 13.0–17.0)
Immature Granulocytes: 0 %
Lymphocytes Relative: 19 %
Lymphs Abs: 1.6 10*3/uL (ref 0.7–4.0)
MCH: 24.9 pg — ABNORMAL LOW (ref 26.0–34.0)
MCHC: 31.7 g/dL (ref 30.0–36.0)
MCV: 78.5 fL — ABNORMAL LOW (ref 80.0–100.0)
Monocytes Absolute: 0.6 10*3/uL (ref 0.1–1.0)
Monocytes Relative: 7 %
Neutro Abs: 6.2 10*3/uL (ref 1.7–7.7)
Neutrophils Relative %: 73 %
Platelets: 191 10*3/uL (ref 150–400)
RBC: 4.1 MIL/uL — ABNORMAL LOW (ref 4.22–5.81)
RDW: 18 % — ABNORMAL HIGH (ref 11.5–15.5)
WBC: 8.6 10*3/uL (ref 4.0–10.5)
nRBC: 0 % (ref 0.0–0.2)

## 2023-09-04 LAB — COMPREHENSIVE METABOLIC PANEL WITH GFR
ALT: 11 U/L (ref 0–44)
AST: 14 U/L — ABNORMAL LOW (ref 15–41)
Albumin: 2.8 g/dL — ABNORMAL LOW (ref 3.5–5.0)
Alkaline Phosphatase: 51 U/L (ref 38–126)
Anion gap: 7 (ref 5–15)
BUN: 19 mg/dL (ref 8–23)
CO2: 22 mmol/L (ref 22–32)
Calcium: 8.2 mg/dL — ABNORMAL LOW (ref 8.9–10.3)
Chloride: 107 mmol/L (ref 98–111)
Creatinine, Ser: 0.77 mg/dL (ref 0.61–1.24)
GFR, Estimated: >60 mL/min (ref >60)
Glucose, Bld: 68 mg/dL — ABNORMAL LOW (ref 70–99)
Potassium: 3.2 mmol/L — ABNORMAL LOW (ref 3.5–5.1)
Sodium: 136 mmol/L (ref 135–145)
Total Bilirubin: 0.6 mg/dL (ref 0.0–1.2)
Total Protein: 5.7 g/dL — ABNORMAL LOW (ref 6.5–8.1)

## 2023-09-04 LAB — MAGNESIUM: Magnesium: 2.1 mg/dL (ref 1.7–2.4)

## 2023-09-04 LAB — PHOSPHORUS: Phosphorus: 2.2 mg/dL — ABNORMAL LOW (ref 2.5–4.6)

## 2023-09-04 MED ORDER — POTASSIUM & SODIUM PHOSPHATES 280-160-250 MG PO PACK
2.0000 | PACK | ORAL | Status: AC
  Administered 2023-09-04: 2 via ORAL
  Filled 2023-09-04: qty 2

## 2023-09-04 MED ORDER — POTASSIUM & SODIUM PHOSPHATES 280-160-250 MG PO PACK
2.0000 | PACK | ORAL | Status: DC
  Administered 2023-09-04 (×2): 2
  Filled 2023-09-04 (×2): qty 2

## 2023-09-04 MED ORDER — LACTULOSE 10 GM/15ML PO SOLN
20.0000 g | Freq: Three times a day (TID) | ORAL | Status: DC
  Administered 2023-09-04 – 2023-09-05 (×2): 20 g via ORAL
  Filled 2023-09-04 (×3): qty 30

## 2023-09-04 NOTE — Progress Notes (Signed)
 PROGRESS NOTE    Adam Rios.  MWU:[id number] DOB: March 14, 1941 DOA: 08/31/2023 PCP: Laqueta Due, MD  Chief Complaint  Patient presents with   Abdominal Pain    Hospital Course:  Adam Rios. is 83 y.o. male with dementia, hypertension, hyperlipidemia, diastolic CHF, stroke, dementia, BPH, depression with anxiety, iron deficiency anemia, renal cell carcinoma, neurogenic bladder, suprapubic catheter, A-fib not on anticoagulation, who presents with abdominal pain.  Patient has had abdominal pain and distention for the last 2 days with some nausea though no vomiting.  He has had clear mucus in his stool.  In the ED labs and vitals are mostly unremarkable.  CT abdomen pelvis reveals gaseous dilation of the redundant sigmoid colon measuring 7.8 cm in diameter with tapering to the sigmoid colon where there is mild wall thickening.  Findings favor ileus secondary to proctocolitis.  Subjective: No acute events overnight. Had large BM night.  Denies any further bowel movements or flatus today.  Denies any abdominal pain.  Objective: Vitals:   09/03/23 1900 09/03/23 2053 09/04/23 0335 09/04/23 0749  BP:  123/77  (!) 149/76  Pulse:  65  66  Resp:  16  16  Temp:  98.4 F (36.9 C)  98.1 F (36.7 C)  TempSrc: Oral     SpO2:  94%  96%  Weight:   86.6 kg   Height:        Intake/Output Summary (Last 24 hours) at 09/04/2023 0845 Last data filed at 09/04/2023 0355 Gross per 24 hour  Intake 100 ml  Output 925 ml  Net -825 ml    Filed Weights   09/02/23 0427 09/02/23 1646 09/04/23 0335  Weight: 86.6 kg 86.6 kg 86.6 kg    Examination: General exam: Appears calm and comfortable, NAD  Respiratory system: No work of breathing, symmetric chest wall expansion Cardiovascular system: S1 & S2 heard, RRR.  Gastrointestinal system: Abdomen is distended but soft, bowel sounds present. Neuro: Alert, intermittently disoriented today.  Aware that he is in the hospital but confused as to the  events prior to this. Extremities: Symmetric, expected ROM Skin: No rashes, lesions Psychiatry: Calm and redirectable.  Assessment & Plan:  Principal Problem:   Proctocolitis Active Problems:   Chronic diastolic CHF (congestive heart failure) (HCC)   Iron deficiency anemia due to chronic blood loss   Essential hypertension   Dyslipidemia   Hypokalemia   History of stroke   Neurogenic bladder   Depression   Ileus (HCC)    Ileus/sigmoid colon distention possible obstruction - Sigmoid colon initially measured 7.8 cm in diameter on CT.  Progressively worsened on KUB up to over 11 cm - Patient underwent flex sigmoidoscopy with gastroenterology which was unremarkable on 4/4 - Gastroenterology has now signed off - NG tube placed 4/2.  Minimal improvement.  Has pulled NG tube multiple times.  Currently on wrist restraints. - This AM NG tube clamped and initiated       clear liquid diet.  Patient appears to be doing well we will remove NG - Initial CT concerning for proctocolitis.  Was initiated on Zosyn for this, no colitis seen on sigmoidoscopy with GI.  Discontinued Zosyn. - Avoid opioids if possible  Chronic diastolic CHF - Echo May 2024: EF 55 to 60% - Euvolemic at this time.  Continue to monitor volume status closely considering his IV fluids. - Continue to hold furosemide and spironolactone  Suprapubic catheter dysfunction - Long-term catheter secondary to neurogenic bladder. - Catheter with  no output yesterday.  Urology consulted and replaced SPT.  Functioning appropriately now.  No evidence of fistula  Iron deficiency anemia - Hemoglobin stable - Continue to trend CBC  Hypertension - Cont close monitoring.  As needed metoprolol and hydralazine.  Resuming home meds  Dyslipidemia - Continue statin   Hypokalemia - Replace as needed  History of CVA Dementia - Resume home meds  - Contine frequent reorientation, delirium precautions.  Neurogenic bladder Suprapubic  catheter Proteus UTI --On cefazolin to end 4/9  Depression - Continue home meds  DVT prophylaxis: Heparin   Code Status: Full Code Family Communication:  Discussed directly with patient, and daughter, [name], by phone. No answer. Disposition: Inpatient, pending PO tolerance. NGT removal today. Consultants:  Treatment Team:  Consulting Physician: Riki Altes, MD  Procedures:    Antimicrobials:  Anti-infectives (From admission, onward)    Start     Dose/Rate Route Frequency Ordered Stop   09/03/23 1400  ceFAZolin (ANCEF) IVPB 1 g/50 mL premix        1 g 100 mL/hr over 30 Minutes Intravenous Every 8 hours 09/03/23 0944 09/08/23 1359   09/01/23 0500  piperacillin-tazobactam (ZOSYN) IVPB 3.375 g  Status:  Discontinued        3.375 g 12.5 mL/hr over 240 Minutes Intravenous Every 8 hours 09/01/23 0137 09/03/23 0944   08/31/23 2145  piperacillin-tazobactam (ZOSYN) IVPB 3.375 g        3.375 g 100 mL/hr over 30 Minutes Intravenous  Once 08/31/23 2134 08/31/23 2249       Data Reviewed: I have personally reviewed following labs and imaging studies CBC: Recent Labs  Lab 08/31/23 1510 09/01/23 0443 09/02/23 0845 09/03/23 0517 09/04/23 0517  WBC 8.9 7.4 7.2 5.9 8.6  NEUTROABS  --   --   --  4.6 6.2  HGB 11.9* 10.5* 10.4* 10.6* 10.2*  HCT 38.9* 33.7* 33.5* 33.4* 32.2*  MCV 81.2 80.6 80.9 79.0* 78.5*  PLT 214 184 189 196 191   Basic Metabolic Panel: Recent Labs  Lab 08/31/23 1510 09/01/23 0443 09/02/23 0845 09/03/23 0517 09/04/23 0517  NA 138 140 140 138 136  K 3.3* 3.1* 2.9* 4.0 3.2*  CL 104 106 108 108 107  CO2 24 27 25  20* 22  GLUCOSE 122* 95 71 90 68*  BUN 21 19 17 15 19   CREATININE 0.89 0.96 0.83 0.85 0.77  CALCIUM 8.7* 8.4* 8.2* 8.3* 8.2*  MG 2.3  --   --  2.1 2.1  PHOS  --   --   --  4.2 2.2*   GFR: Estimated Creatinine Clearance: 79.1 mL/min (by C-G formula based on SCr of 0.77 mg/dL). Liver Function Tests: Recent Labs  Lab 08/31/23 1510  09/02/23 0845 09/03/23 0517 09/04/23 0517  AST 17 14* 13* 14*  ALT 12 10 11 11   ALKPHOS 79 60 57 51  BILITOT 0.6 0.7 1.2 0.6  PROT 6.7 5.9* 5.7* 5.7*  ALBUMIN 3.2* 2.7* 2.7* 2.8*   CBG: Recent Labs  Lab 09/02/23 0737 09/03/23 0819  GLUCAP 71 90    Recent Results (from the past 240 hours)  Urine Culture (for pregnant, neutropenic or urologic patients or patients with an indwelling urinary catheter)     Status: Abnormal   Collection Time: 08/31/23  3:10 PM   Specimen: Urine, Random  Result Value Ref Range Status   Specimen Description   Final    URINE, RANDOM Performed at [facility], [address], [place], [place] [address]  Special Requests   Final    NONE Performed at [facility], [address] [place], [place] [address]    Culture >=100,000 COLONIES/mL PROTEUS MIRABILIS (A)  Final   Report Status 09/02/2023 FINAL  Final   Organism ID, Bacteria PROTEUS MIRABILIS (A)  Final      Susceptibility   Proteus mirabilis - MIC*    AMPICILLIN <=2 SENSITIVE Sensitive     CEFAZOLIN 8 SENSITIVE Sensitive     CEFEPIME <=0.12 SENSITIVE Sensitive     CEFTRIAXONE <=0.25 SENSITIVE Sensitive     CIPROFLOXACIN >=4 RESISTANT Resistant     GENTAMICIN <=1 SENSITIVE Sensitive     IMIPENEM 2 SENSITIVE Sensitive     NITROFURANTOIN 128 RESISTANT Resistant     TRIMETH/SULFA <=20 SENSITIVE Sensitive     AMPICILLIN/SULBACTAM <=2 SENSITIVE Sensitive     PIP/TAZO <=4 SENSITIVE Sensitive ug/mL    * >=100,000 COLONIES/mL PROTEUS MIRABILIS  Culture, blood (Routine X 2) w Reflex to ID Panel     Status: None (Preliminary result)   Collection Time: 08/31/23 10:50 PM   Specimen: BLOOD  Result Value Ref Range Status   Specimen Description BLOOD LEFT FOREARM  Final   Special Requests   Final    BOTTLES DRAWN AEROBIC ONLY Blood Culture results may not be optimal due to an inadequate volume of blood received in culture bottles   Culture   Final    NO GROWTH 4  DAYS Performed at [facility], [address] [place], [place] [address]    Report Status PENDING  Incomplete  Culture, blood (Routine X 2) w Reflex to ID Panel     Status: None (Preliminary result)   Collection Time: 08/31/23 10:50 PM   Specimen: BLOOD  Result Value Ref Range Status   Specimen Description BLOOD LEFT ASSIST CONTROL  Final   Special Requests   Final    BOTTLES DRAWN AEROBIC ONLY Blood Culture results may not be optimal due to an inadequate volume of blood received in culture bottles   Culture   Final    NO GROWTH 4 DAYS Performed at [facility], [address] [place], [place] [address]    Report Status PENDING  Incomplete  C Difficile Quick Screen w PCR reflex     Status: None   Collection Time: 09/01/23  1:51 PM   Specimen: STOOL  Result Value Ref Range Status   C Diff antigen NEGATIVE NEGATIVE Final   C Diff toxin NEGATIVE NEGATIVE Final   C Diff interpretation No C. difficile detected.  Final    Comment: Performed at [facility], [address] [place], [place] [address]     Radiology Studies: DG Abd 1 View Result Date: 09/03/2023 CLINICAL DATA:  NG tube EXAM: ABDOMEN - 1 VIEW COMPARISON:  Abdominal x-ray 09/03/2023 FINDINGS: Findings nasogastric tube tip is in the proximal body of the stomach. No dilated bowel loops are seen today Left-sided pacemaker and cardiomegaly are stable. There is atelectasis in the right mid lung. IMPRESSION: 1. Nasogastric tube tip is in the proximal body of the stomach. Electronically Signed   By: Darliss Cheney M.D.   On: 09/03/2023 22:14   DG Abd 1 View Result Date: 09/03/2023 CLINICAL DATA:  Colonic obstruction. EXAM: ABDOMEN - 1 VIEW COMPARISON:  September 02, 2023. FINDINGS: Phleboliths are noted in the pelvis. Sigmoid colon dilatation is decreased compared to prior exam, now with maximum measured diameter of 8.6 cm. No other bowel dilatation is noted. Moderate amount  of stool is seen in right and left  colon and rectum. IMPRESSION: Moderate stool burden. Decreased dilatation of sigmoid colon compared to prior exam. No other bowel dilatation is noted. Electronically Signed   By: Lupita Raider M.D.   On: 09/03/2023 09:02    Scheduled Meds:  atorvastatin  40 mg Oral Daily   cyanocobalamin  1,000 mcg Oral Daily   fesoterodine  4 mg Oral Daily   gabapentin  600 mg Oral BID   heparin  5,000 Units Subcutaneous Q8H   iron polysaccharides  150 mg Oral Daily   lactulose  20 g Per Tube TID   metoprolol succinate  12.5 mg Oral Daily   mirabegron ER  25 mg Oral Daily   pantoprazole  20 mg Oral Daily   sertraline  50 mg Oral Daily   tamsulosin  0.4 mg Oral Daily   Continuous Infusions:   ceFAZolin (ANCEF) IV 1 g (09/04/23 0532)     LOS: 3 days  MDM: Patient is high risk for one or more organ failure.  They necessitate ongoing hospitalization for continued IV therapies and subsequent lab monitoring. Total time spent interpreting labs and vitals, coordinating care amongst consultants and care team members, directly assessing and discussing care with the patient and/or family: 55 min   Debarah Crape, DO [facility]  To contact the attending physician between 7A-7P please use Epic Chat. To contact the covering physician during after hours 7P-7A, please review Amion.   09/04/2023, 8:45 AM   *This document has been created with the assistance of dictation software. Please excuse typographical errors. *

## 2023-09-04 NOTE — Progress Notes (Signed)
 NG tube removed. Patient had a large BM and passed gas. Patient able to eat clear liquid diet. Denies pain and nausea.  Sitter and restrains discontinued since no longer has NG tube. MD Dezzi made aware

## 2023-09-05 DIAGNOSIS — I5032 Chronic diastolic (congestive) heart failure: Secondary | ICD-10-CM | POA: Diagnosis not present

## 2023-09-05 DIAGNOSIS — F32A Depression, unspecified: Secondary | ICD-10-CM | POA: Diagnosis not present

## 2023-09-05 DIAGNOSIS — K529 Noninfective gastroenteritis and colitis, unspecified: Secondary | ICD-10-CM | POA: Diagnosis not present

## 2023-09-05 DIAGNOSIS — D500 Iron deficiency anemia secondary to blood loss (chronic): Secondary | ICD-10-CM | POA: Diagnosis not present

## 2023-09-05 LAB — COMPREHENSIVE METABOLIC PANEL WITH GFR
ALT: 8 U/L (ref 0–44)
AST: 14 U/L — ABNORMAL LOW (ref 15–41)
Albumin: 2.7 g/dL — ABNORMAL LOW (ref 3.5–5.0)
Alkaline Phosphatase: 52 U/L (ref 38–126)
Anion gap: 7 (ref 5–15)
BUN: 11 mg/dL (ref 8–23)
CO2: 24 mmol/L (ref 22–32)
Calcium: 8 mg/dL — ABNORMAL LOW (ref 8.9–10.3)
Chloride: 106 mmol/L (ref 98–111)
Creatinine, Ser: 0.72 mg/dL (ref 0.61–1.24)
GFR, Estimated: >60 mL/min (ref >60)
Glucose, Bld: 74 mg/dL (ref 70–99)
Potassium: 3.1 mmol/L — ABNORMAL LOW (ref 3.5–5.1)
Sodium: 137 mmol/L (ref 135–145)
Total Bilirubin: 1 mg/dL (ref 0.0–1.2)
Total Protein: 5.4 g/dL — ABNORMAL LOW (ref 6.5–8.1)

## 2023-09-05 LAB — MAGNESIUM: Magnesium: 2 mg/dL (ref 1.7–2.4)

## 2023-09-05 LAB — GLUCOSE, CAPILLARY
Glucose-Capillary: 78 mg/dL (ref 70–99)
Glucose-Capillary: 109 mg/dL — ABNORMAL HIGH (ref 70–99)

## 2023-09-05 LAB — CBC WITH DIFFERENTIAL/PLATELET
Abs Immature Granulocytes: 0.03 10*3/uL (ref 0.00–0.07)
Basophils Absolute: 0 10*3/uL (ref 0.0–0.1)
Basophils Relative: 0 %
Eosinophils Absolute: 0.1 10*3/uL (ref 0.0–0.5)
Eosinophils Relative: 2 %
HCT: 33.3 % — ABNORMAL LOW (ref 39.0–52.0)
Hemoglobin: 10.6 g/dL — ABNORMAL LOW (ref 13.0–17.0)
Immature Granulocytes: 0 %
Lymphocytes Relative: 21 %
Lymphs Abs: 1.5 10*3/uL (ref 0.7–4.0)
MCH: 25.5 pg — ABNORMAL LOW (ref 26.0–34.0)
MCHC: 31.8 g/dL (ref 30.0–36.0)
MCV: 80.2 fL (ref 80.0–100.0)
Monocytes Absolute: 0.6 10*3/uL (ref 0.1–1.0)
Monocytes Relative: 8 %
Neutro Abs: 4.9 10*3/uL (ref 1.7–7.7)
Neutrophils Relative %: 69 %
Platelets: 162 10*3/uL (ref 150–400)
RBC: 4.15 MIL/uL — ABNORMAL LOW (ref 4.22–5.81)
RDW: 18.1 % — ABNORMAL HIGH (ref 11.5–15.5)
WBC: 7.2 10*3/uL (ref 4.0–10.5)
nRBC: 0 % (ref 0.0–0.2)

## 2023-09-05 LAB — PHOSPHORUS: Phosphorus: 2.6 mg/dL (ref 2.5–4.6)

## 2023-09-05 LAB — CULTURE, BLOOD (ROUTINE X 2)
Culture: NO GROWTH
Culture: NO GROWTH

## 2023-09-05 MED ORDER — LACTULOSE 10 GM/15ML PO SOLN
10.0000 g | Freq: Two times a day (BID) | ORAL | Status: DC
  Administered 2023-09-05 – 2023-09-06 (×2): 10 g via ORAL
  Filled 2023-09-05 (×2): qty 30

## 2023-09-05 MED ORDER — CHLORHEXIDINE GLUCONATE CLOTH 2 % EX PADS
6.0000 | MEDICATED_PAD | Freq: Every day | CUTANEOUS | Status: DC
  Administered 2023-09-05: 6 via TOPICAL

## 2023-09-05 MED ORDER — POTASSIUM CHLORIDE CRYS ER 20 MEQ PO TBCR
40.0000 meq | EXTENDED_RELEASE_TABLET | Freq: Once | ORAL | Status: AC
  Administered 2023-09-05: 40 meq via ORAL
  Filled 2023-09-05: qty 2

## 2023-09-05 NOTE — Plan of Care (Signed)

## 2023-09-05 NOTE — Progress Notes (Signed)
 PROGRESS NOTE    Adam Rios.  ZOX:[id number] DOB: 02-15-1941 DOA: 08/31/2023 PCP: Laqueta Due, MD  Chief Complaint  Patient presents with   Abdominal Pain    Hospital Course:  Adam Rios. is 83 y.o. male with dementia, hypertension, hyperlipidemia, diastolic CHF, stroke, dementia, BPH, depression with anxiety, iron deficiency anemia, renal cell carcinoma, neurogenic bladder, suprapubic catheter, A-fib not on anticoagulation, who presents with abdominal pain.  Patient has had abdominal pain and distention for the last 2 days with some nausea though no vomiting.  He has had clear mucus in his stool.  In the ED labs and vitals are mostly unremarkable.  CT abdomen pelvis reveals gaseous dilation of the redundant sigmoid colon measuring 7.8 cm in diameter with tapering to the sigmoid colon where there is mild wall thickening.  Findings favor ileus secondary to proctocolitis.  Subjective: No acute events overnight.  Patient is tolerating clear liquid diet and is ready to advance his diet.  Bedside RN reports he is having consistent bowel movements now.  She does endorse small amount of blood and in one bowel movement.  Objective: Vitals:   09/04/23 2053 09/05/23 0324 09/05/23 0424 09/05/23 0842  BP: 136/84 133/78  117/73  Pulse: 63 60  (!) 59  Resp: 16 20  17   Temp: 98.5 F (36.9 C) 97.6 F (36.4 C)  98 F (36.7 C)  TempSrc: Oral Oral    SpO2: 93% 93%  94%  Weight:   83.4 kg   Height:        Intake/Output Summary (Last 24 hours) at 09/05/2023 0909 Last data filed at 09/04/2023 1547 Gross per 24 hour  Intake 240 ml  Output 400 ml  Net -160 ml    Filed Weights   09/02/23 1646 09/04/23 0335 09/05/23 0424  Weight: 86.6 kg 86.6 kg 83.4 kg    Examination: General exam: Appears calm and comfortable, NAD  Respiratory system: No work of breathing, symmetric chest wall expansion Cardiovascular system: S1 & S2 heard, RRR.  Gastrointestinal system: Abdomen is distended but  soft, bowel sounds present. Neuro: Alert, intermittently disoriented today.  Aware that he is in the hospital but confused as to the events prior to this. Extremities: Symmetric, expected ROM Skin: No rashes, lesions Psychiatry: Calm and redirectable.  Assessment & Plan:  Principal Problem:   Proctocolitis Active Problems:   Chronic diastolic CHF (congestive heart failure) (HCC)   Iron deficiency anemia due to chronic blood loss   Essential hypertension   Dyslipidemia   Hypokalemia   History of stroke   Neurogenic bladder   Depression   Ileus (HCC)    Ileus/sigmoid colon distention possible obstruction - Sigmoid colon initially measured 7.8 cm in diameter on CT.  Progressively worsened on KUB up to over 11 cm - Patient underwent flex sigmoidoscopy with gastroenterology which was unremarkable on 4/4 - Gastroenterology has now signed off - NG tube placed 4/2, removed 4/5.  Patient is tolerating clear liquid diet.  Advance to full diet today -- Cont bowel regimen, having consistent bowel movements.  Will back down lactulose - Initial CT concerning for proctocolitis.  Was initiated on Zosyn for this, no colitis seen on sigmoidoscopy with GI.  Discontinued Zosyn. - Avoid opioids if possible  Chronic diastolic CHF - Echo May 2024: EF 55 to 60% - Euvolemic at this time. - Continue to monitor volume status closely - Hold diuretics for now  Suprapubic catheter dysfunction, resolved - Long-term catheter secondary to neurogenic bladder. -  Urology consulted and replaced SPT 4/4.  Functioning appropriately now.  No evidence of fistula  Iron deficiency anemia - Hemoglobin stable - Sigmoidoscopy unremarkable.  Small amount of blood reported by bedside RN. -Continue to trend CBC  Hypertension - Continue daily monitoring, titrate meds as needed  Dyslipidemia - Continue statin   Hypokalemia - Replace as needed  History of CVA Dementia - Resume home meds - Mental status waxes  and wanes - Continue frequent reorientation and delirium precautions  Neurogenic bladder Suprapubic catheter Proteus UTI --On cefazolin to end 4/9  Depression - Continue home meds  DVT prophylaxis: Heparin   Code Status: Full Code Family Communication:  Discussed directly with patient.  Attempted to reach daughter by phone, no answer. Disposition: Medically stable to discharge.  Have discussed with case management.  Returning to [facility] Consultants:  Treatment Team:  Consulting Physician: Riki Altes, MD  Procedures:    Antimicrobials:  Anti-infectives (From admission, onward)    Start     Dose/Rate Route Frequency Ordered Stop   09/03/23 1400  ceFAZolin (ANCEF) IVPB 1 g/50 mL premix        1 g 100 mL/hr over 30 Minutes Intravenous Every 8 hours 09/03/23 0944 09/08/23 1359   09/01/23 0500  piperacillin-tazobactam (ZOSYN) IVPB 3.375 g  Status:  Discontinued        3.375 g 12.5 mL/hr over 240 Minutes Intravenous Every 8 hours 09/01/23 0137 09/03/23 0944   08/31/23 2145  piperacillin-tazobactam (ZOSYN) IVPB 3.375 g        3.375 g 100 mL/hr over 30 Minutes Intravenous  Once 08/31/23 2134 08/31/23 2249       Data Reviewed: I have personally reviewed following labs and imaging studies CBC: Recent Labs  Lab 09/01/23 0443 09/02/23 0845 09/03/23 0517 09/04/23 0517 09/05/23 0513  WBC 7.4 7.2 5.9 8.6 7.2  NEUTROABS  --   --  4.6 6.2 4.9  HGB 10.5* 10.4* 10.6* 10.2* 10.6*  HCT 33.7* 33.5* 33.4* 32.2* 33.3*  MCV 80.6 80.9 79.0* 78.5* 80.2  PLT 184 189 196 191 162   Basic Metabolic Panel: Recent Labs  Lab 08/31/23 1510 09/01/23 0443 09/02/23 0845 09/03/23 0517 09/04/23 0517 09/05/23 0513  NA 138 140 140 138 136 137  K 3.3* 3.1* 2.9* 4.0 3.2* 3.1*  CL 104 106 108 108 107 106  CO2 24 27 25  20* 22 24  GLUCOSE 122* 95 71 90 68* 74  BUN 21 19 17 15 19 11   CREATININE 0.89 0.96 0.83 0.85 0.77 0.72  CALCIUM 8.7* 8.4* 8.2* 8.3* 8.2* 8.0*  MG 2.3  --    --  2.1 2.1 2.0  PHOS  --   --   --  4.2 2.2* 2.6   GFR: Estimated Creatinine Clearance: 79.1 mL/min (by C-G formula based on SCr of 0.72 mg/dL). Liver Function Tests: Recent Labs  Lab 08/31/23 1510 09/02/23 0845 09/03/23 0517 09/04/23 0517 09/05/23 0513  AST 17 14* 13* 14* 14*  ALT 12 10 11 11 8   ALKPHOS 79 60 57 51 52  BILITOT 0.6 0.7 1.2 0.6 1.0  PROT 6.7 5.9* 5.7* 5.7* 5.4*  ALBUMIN 3.2* 2.7* 2.7* 2.8* 2.7*   CBG: Recent Labs  Lab 09/02/23 0737 09/03/23 0819 09/05/23 0846  GLUCAP 71 90 78    Recent Results (from the past 240 hours)  Urine Culture (for pregnant, neutropenic or urologic patients or patients with an indwelling urinary catheter)     Status: Abnormal   Collection Time:  08/31/23  3:10 PM   Specimen: Urine, Random  Result Value Ref Range Status   Specimen Description   Final    URINE, RANDOM Performed at [facility], [address] [place], [place] [address]    Special Requests   Final    NONE Performed at [facility], [address] [place], [place] [address]    Culture >=100,000 COLONIES/mL PROTEUS MIRABILIS (A)  Final   Report Status 09/02/2023 FINAL  Final   Organism ID, Bacteria PROTEUS MIRABILIS (A)  Final      Susceptibility   Proteus mirabilis - MIC*    AMPICILLIN <=2 SENSITIVE Sensitive     CEFAZOLIN 8 SENSITIVE Sensitive     CEFEPIME <=0.12 SENSITIVE Sensitive     CEFTRIAXONE <=0.25 SENSITIVE Sensitive     CIPROFLOXACIN >=4 RESISTANT Resistant     GENTAMICIN <=1 SENSITIVE Sensitive     IMIPENEM 2 SENSITIVE Sensitive     NITROFURANTOIN 128 RESISTANT Resistant     TRIMETH/SULFA <=20 SENSITIVE Sensitive     AMPICILLIN/SULBACTAM <=2 SENSITIVE Sensitive     PIP/TAZO <=4 SENSITIVE Sensitive ug/mL    * >=100,000 COLONIES/mL PROTEUS MIRABILIS  Culture, blood (Routine X 2) w Reflex to ID Panel     Status: None   Collection Time: 08/31/23 10:50 PM   Specimen: BLOOD  Result Value Ref Range Status   Specimen  Description BLOOD LEFT FOREARM  Final   Special Requests   Final    BOTTLES DRAWN AEROBIC ONLY Blood Culture results may not be optimal due to an inadequate volume of blood received in culture bottles   Culture   Final    NO GROWTH 5 DAYS Performed at [facility], [address] [place], [place] [address]    Report Status 09/05/2023 FINAL  Final  Culture, blood (Routine X 2) w Reflex to ID Panel     Status: None   Collection Time: 08/31/23 10:50 PM   Specimen: BLOOD  Result Value Ref Range Status   Specimen Description BLOOD LEFT ASSIST CONTROL  Final   Special Requests   Final    BOTTLES DRAWN AEROBIC ONLY Blood Culture results may not be optimal due to an inadequate volume of blood received in culture bottles   Culture   Final    NO GROWTH 5 DAYS Performed at [facility], [address] [place], [place] [address]    Report Status 09/05/2023 FINAL  Final  C Difficile Quick Screen w PCR reflex     Status: None   Collection Time: 09/01/23  1:51 PM   Specimen: STOOL  Result Value Ref Range Status   C Diff antigen NEGATIVE NEGATIVE Final   C Diff toxin NEGATIVE NEGATIVE Final   C Diff interpretation No C. difficile detected.  Final    Comment: Performed at [facility], [address] [place], [place] [address]     Radiology Studies: DG Abd 1 View Result Date: 09/03/2023 CLINICAL DATA:  NG tube EXAM: ABDOMEN - 1 VIEW COMPARISON:  Abdominal x-ray 09/03/2023 FINDINGS: Findings nasogastric tube tip is in the proximal body of the stomach. No dilated bowel loops are seen today Left-sided pacemaker and cardiomegaly are stable. There is atelectasis in the right mid lung. IMPRESSION: 1. Nasogastric tube tip is in the proximal body of the stomach. Electronically Signed   By: Darliss Cheney M.D.   On: 09/03/2023 22:14    Scheduled Meds:  atorvastatin  40 mg Oral Daily   cyanocobalamin  1,000 mcg Oral Daily   fesoterodine  4 mg Oral Daily   gabapentin  600 mg  Oral BID   heparin  5,000 Units Subcutaneous Q8H   iron polysaccharides  150 mg Oral Daily   lactulose  20 g Oral TID   metoprolol succinate  12.5 mg Oral Daily   mirabegron ER  25 mg Oral Daily   pantoprazole  20 mg Oral Daily   potassium chloride  40 mEq Oral Once   sertraline  50 mg Oral Daily   tamsulosin  0.4 mg Oral Daily   Continuous Infusions:   ceFAZolin (ANCEF) IV 1 g (09/05/23 0528)     LOS: 4 days  MDM: Patient is high risk for one or more organ failure.  They necessitate ongoing hospitalization for continued IV therapies and subsequent lab monitoring. Total time spent interpreting labs and vitals, coordinating care amongst consultants and care team members, directly assessing and discussing care with the patient and/or family: 55 min   Debarah Crape, DO [facility]  To contact the attending physician between 7A-7P please use Epic Chat. To contact the covering physician during after hours 7P-7A, please review Amion.   09/05/2023, 9:09 AM   *This document has been created with the assistance of dictation software. Please excuse typographical errors. *

## 2023-09-05 NOTE — TOC Progression Note (Signed)
 Transition of Care (TOC) - Progression Note    Patient Details  Name: Adam Rios. MRN: [id number] Date of Birth: 1940/06/05  Transition of Care [facility]) CM/SW Contact  Liliana Cline, LCSW Phone Number: 09/05/2023, 12:51 PM  Clinical Narrative:    Notified by DO - patient is medically ready for DC. CSW called Gavin Pound at [facility], she states the earliest patient can come back is tomorrow.   Expected Discharge Plan: Skilled Nursing Facility Barriers to Discharge: Continued Medical Work up  Expected Discharge Plan and Services       Living arrangements for the past 2 months: Skilled Nursing Facility                                       Social Determinants of Health (SDOH) Interventions SDOH Screenings   Food Insecurity: No Food Insecurity (09/01/2023)  Housing: Low Risk  (09/01/2023)  Transportation Needs: No Transportation Needs (09/01/2023)  Utilities: Not At Risk (09/01/2023)  Financial Resource Strain: Low Risk  (05/04/2022)   Received from [facility], [facility]  Physical Activity: Inactive (03/29/2018)   Received from [facility], [facility]  Social Connections: Socially Isolated (09/01/2023)  Stress: No Stress Concern Present (03/29/2018)   Received from [facility], [facility]  Tobacco Use: Medium Risk (09/02/2023)  Health Literacy: Low Risk  (05/24/2023)   Received from [facility]    Readmission Risk Interventions    08/11/2022    4:53 PM 06/11/2022    2:19 PM 02/12/2022    2:17 PM  Readmission Risk Prevention Plan  Transportation Screening Complete Complete Complete  PCP or Specialist Appt within 3-5 Days  Complete Complete  HRI or Home Care Consult  Complete Complete  Social Work Consult for Recovery Care Planning/Counseling  Complete Complete  Palliative Care Screening  Not Applicable Not Applicable  Medication Review Oceanographer) Complete Complete Referral to Pharmacy  [facility] or Home Care Consult Complete     Skilled Nursing Facility Complete

## 2023-09-06 DIAGNOSIS — K529 Noninfective gastroenteritis and colitis, unspecified: Secondary | ICD-10-CM | POA: Diagnosis not present

## 2023-09-06 DIAGNOSIS — Z8673 Personal history of transient ischemic attack (TIA), and cerebral infarction without residual deficits: Secondary | ICD-10-CM | POA: Diagnosis not present

## 2023-09-06 DIAGNOSIS — I5032 Chronic diastolic (congestive) heart failure: Secondary | ICD-10-CM | POA: Diagnosis not present

## 2023-09-06 DIAGNOSIS — K567 Ileus, unspecified: Principal | ICD-10-CM

## 2023-09-06 DIAGNOSIS — E876 Hypokalemia: Secondary | ICD-10-CM | POA: Diagnosis not present

## 2023-09-06 LAB — CBC WITH DIFFERENTIAL/PLATELET
Abs Immature Granulocytes: 0.02 10*3/uL (ref 0.00–0.07)
Basophils Absolute: 0 10*3/uL (ref 0.0–0.1)
Basophils Relative: 1 %
Eosinophils Absolute: 0.2 10*3/uL (ref 0.0–0.5)
Eosinophils Relative: 2 %
HCT: 35.5 % — ABNORMAL LOW (ref 39.0–52.0)
Hemoglobin: 11 g/dL — ABNORMAL LOW (ref 13.0–17.0)
Immature Granulocytes: 0 %
Lymphocytes Relative: 22 %
Lymphs Abs: 1.5 10*3/uL (ref 0.7–4.0)
MCH: 25.2 pg — ABNORMAL LOW (ref 26.0–34.0)
MCHC: 31 g/dL (ref 30.0–36.0)
MCV: 81.2 fL (ref 80.0–100.0)
Monocytes Absolute: 0.6 10*3/uL (ref 0.1–1.0)
Monocytes Relative: 8 %
Neutro Abs: 4.4 10*3/uL (ref 1.7–7.7)
Neutrophils Relative %: 67 %
Platelets: 161 10*3/uL (ref 150–400)
RBC: 4.37 MIL/uL (ref 4.22–5.81)
RDW: 18.2 % — ABNORMAL HIGH (ref 11.5–15.5)
WBC: 6.6 10*3/uL (ref 4.0–10.5)
nRBC: 0 % (ref 0.0–0.2)

## 2023-09-06 LAB — COMPREHENSIVE METABOLIC PANEL WITH GFR
ALT: 7 U/L (ref 0–44)
AST: 14 U/L — ABNORMAL LOW (ref 15–41)
Albumin: 2.7 g/dL — ABNORMAL LOW (ref 3.5–5.0)
Alkaline Phosphatase: 52 U/L (ref 38–126)
Anion gap: 10 (ref 5–15)
BUN: 9 mg/dL (ref 8–23)
CO2: 22 mmol/L (ref 22–32)
Calcium: 8.3 mg/dL — ABNORMAL LOW (ref 8.9–10.3)
Chloride: 106 mmol/L (ref 98–111)
Creatinine, Ser: 0.72 mg/dL (ref 0.61–1.24)
GFR, Estimated: >60 mL/min (ref >60)
Glucose, Bld: 93 mg/dL (ref 70–99)
Potassium: 3.5 mmol/L (ref 3.5–5.1)
Sodium: 138 mmol/L (ref 135–145)
Total Bilirubin: 0.7 mg/dL (ref 0.0–1.2)
Total Protein: 5.6 g/dL — ABNORMAL LOW (ref 6.5–8.1)

## 2023-09-06 LAB — PHOSPHORUS: Phosphorus: 2.4 mg/dL — ABNORMAL LOW (ref 2.5–4.6)

## 2023-09-06 LAB — GLUCOSE, CAPILLARY: Glucose-Capillary: 99 mg/dL (ref 70–99)

## 2023-09-06 LAB — MAGNESIUM: Magnesium: 2 mg/dL (ref 1.7–2.4)

## 2023-09-06 MED ORDER — CEPHALEXIN 500 MG PO CAPS
500.0000 mg | ORAL_CAPSULE | Freq: Two times a day (BID) | ORAL | Status: AC
Start: 2023-09-06 — End: 2023-09-08

## 2023-09-06 MED ORDER — LACTULOSE 10 GM/15ML PO SOLN: 10.0000 g | Freq: Two times a day (BID) | ORAL | 0 refills | Status: DC

## 2023-09-06 NOTE — TOC Transition Note (Signed)
 Transition of Care [facility]) - Discharge Note   Patient Details  Name: Adam Rios. MRN: [id number] Date of Birth: October 31, 1940  Transition of Care [facility]) CM/SW Contact:  Chapman Fitch, RN Phone Number: 09/06/2023, 12:17 PM   Clinical Narrative:     Patient will DC to: [facility] Anticipated DC date:09/06/23  Family notified:Daughter Charity fundraiser NW:GNFA  Per MD patient ready for DC to . RN, , patient's family, and facility notified of DC. Discharge Summary sent to facility. RN given number for report. DC packet on chart. Ambulance transport requested for patient.  TOC signing off.     Barriers to Discharge: Continued Medical Work up   Patient Goals and CMS Choice   CMS Medicare.gov Compare Post Acute Care list provided to:: Patient Represenative (must comment) Choice offered to / list presented to : Adult Children      Discharge Placement                       Discharge Plan and Services Additional resources added to the After Visit Summary for                                       Social Drivers of Health (SDOH) Interventions SDOH Screenings   Food Insecurity: No Food Insecurity (09/01/2023)  Housing: Low Risk  (09/01/2023)  Transportation Needs: No Transportation Needs (09/01/2023)  Utilities: Not At Risk (09/01/2023)  Financial Resource Strain: Low Risk  (05/04/2022)   Received from [facility], [facility]  Physical Activity: Inactive (03/29/2018)   Received from [facility], [facility]  Social Connections: Socially Isolated (09/01/2023)  Stress: No Stress Concern Present (03/29/2018)   Received from [facility], [facility]  Tobacco Use: Medium Risk (09/02/2023)  Health Literacy: Low Risk  (05/24/2023)   Received from [facility]     Readmission Risk Interventions    08/11/2022    4:53 PM 06/11/2022    2:19 PM 02/12/2022    2:17 PM  Readmission Risk Prevention Plan  Transportation Screening Complete  Complete Complete  PCP or Specialist Appt within 3-5 Days  Complete Complete  HRI or Home Care Consult  Complete Complete  Social Work Consult for Recovery Care Planning/Counseling  Complete Complete  Palliative Care Screening  Not Applicable Not Applicable  Medication Review Oceanographer) Complete Complete Referral to Pharmacy  [facility] or Home Care Consult Complete    Skilled Nursing Facility Complete

## 2023-09-06 NOTE — Discharge Summary (Addendum)
 Physician Discharge Summary   Patient: Adam Rios. MRN: [id number] DOB: April 18, 1941  Admit date:     08/31/2023  Discharge date: 09/06/23  Discharge Physician: Debarah Crape   PCP: Laqueta Due, MD   Recommendations at discharge:   Follow-up with primary care for chronic medication management Complete final 2 days of antibiotics for urinary tract infection. Take lactulose to ensure you do not become constipated  Discharge Diagnoses: Principal Problem:   Proctocolitis Active Problems:   Chronic diastolic CHF (congestive heart failure) (HCC)   Iron deficiency anemia due to chronic blood loss   Essential hypertension   Dyslipidemia   Hypokalemia   History of stroke   Neurogenic bladder   Depression   Ileus (HCC)  Resolved Problems:   * No resolved hospital problems. *  Hospital Course: Adam Rios. is 83 y.o. male with dementia, hypertension, hyperlipidemia, diastolic CHF, stroke, dementia, BPH, depression with anxiety, iron deficiency anemia, renal cell carcinoma, neurogenic bladder, suprapubic catheter, A-fib not on anticoagulation, presented with abdominal pain and distention.  CT abdomen pelvis revealed sigmoid colon distention favoring ileus secondary to proctocolitis.  NG tube was placed but distention progressively worsened on KUB.  Gastroenterology was consulted.  Patient underwent flex sigmoidoscopy on 4/4 which was unremarkable for stricture or acute findings.  Patient was started on aggressive bowel regimen and began having bowel movements.  Ultimately NG tube was removed on 4/5 and patient was tolerating full diet.  Stay was then further delayed pending return to his facility.  Today he is discharging directly to [facility].  He will need more aggressive bowel regimen moving forward to avoid constipation. Stay was briefly complicated by suprapubic catheter dysfunction.  Urology was consulted and replaced SPT on 4/4.  It is functioning appropriately now.       Ileus/sigmoid colon distention possible obstruction - Sigmoid colon initially measured 7.8 cm in diameter on CT.  Progressively worsened on KUB up to over 11 cm - Patient underwent flex sigmoidoscopy with gastroenterology which was unremarkable on 4/4 - NG tube placed 4/2, removed 4/5.  -- Cont bowel regimen - Initial CT concerning for proctocolitis.  Was initiated on Zosyn for this, no colitis seen on sigmoidoscopy with GI.  Discontinued Zosyn. - Avoid opioids if possible   Chronic diastolic CHF - Echo May 2024: EF 55 to 60% - Euvolemic at this time.  Suprapubic catheter dysfunction, resolved - Long-term catheter secondary to neurogenic bladder. -  Urology consulted and replaced SPT 4/4.  Functioning appropriately now.  No evidence of fistula   Iron deficiency anemia - Hemoglobin stable - Sigmoidoscopy unremarkable.  Small amount of blood reported by bedside RN.  Hypertension - Continue daily monitoring, titrate meds as needed   Dyslipidemia - Continue statin    Hypokalemia - Replace as needed   History of CVA Dementia - Resume home meds - Mental status waxes and wanes - Continue frequent reorientation and delirium precautions   Neurogenic bladder Suprapubic catheter Proteus UTI --Sensitive to cefazolin. Sent will need to finish antibiotic course 4/8  Depression - Continue home meds  Consultants: Gastroenterology, urology Procedures performed: Flexible sigmoidoscopy,, SPT exchange Disposition: Nursing home Diet: Regular, low sodium  DISCHARGE MEDICATION: Allergies as of 09/06/2023       Reactions   Empagliflozin    Recurrent UTI with sepsis on jardiance.   Fentanyl Nausea Only   Oxycodone Nausea And Vomiting        Medication List     PAUSE taking these medications  spironolactone 25 MG tablet Wait to take this until your doctor or other care provider tells you to start again. Commonly known as: ALDACTONE Take 25 mg by mouth daily.        STOP taking these medications    magnesium oxide 400 (240 Mg) MG tablet Commonly known as: MAG-OX   pantoprazole 20 MG tablet Commonly known as: PROTONIX   Psyllium Fiber 0.52 g Caps   traMADol 50 MG tablet Commonly known as: ULTRAM       TAKE these medications    Acetaminophen Extra Strength 500 MG Tabs Take 1,000 mg by mouth 3 (three) times daily as needed (PAIN).   acetic acid 0.25 % irrigation Apply 1 Application topically as needed.   atorvastatin 40 MG tablet Commonly known as: LIPITOR Take 40 mg by mouth daily.   CalProtect 0.44-20.6 % Oint Generic drug: Menthol-Zinc Oxide Apply 1 Application topically 2 (two) times daily.   cephALEXin 500 MG capsule Commonly known as: KEFLEX Take 1 capsule (500 mg total) by mouth in the morning and at bedtime for 2 days.   [employer] Cortisone Maximum Strength 1 % Generic drug: hydrocortisone cream Apply 1 Application topically as needed for itching.   cyanocobalamin 1000 MCG tablet Commonly known as: VITAMIN B12 Take 1,000 mcg by mouth daily.   docusate sodium 100 MG capsule Commonly known as: COLACE Take 1 capsule (100 mg total) by mouth 2 (two) times daily.   finasteride 5 MG tablet Commonly known as: PROSCAR Take 5 mg by mouth daily.   gabapentin 300 MG capsule Commonly known as: NEURONTIN Take 2 capsules (600 mg total) by mouth 2 (two) times daily.   lactulose 10 GM/15ML solution Commonly known as: CHRONULAC Take 15 mLs (10 g total) by mouth 2 (two) times daily. Titrate to have at least one bowel movement daily   metoCLOPramide 5 MG tablet Commonly known as: REGLAN Take 1-2 tablets (5-10 mg total) by mouth every 8 (eight) hours as needed for nausea (if ondansetron (ZOFRAN) ineffective.).   metoprolol succinate 25 MG 24 hr tablet Commonly known as: TOPROL-XL Take 0.5 tablets (12.5 mg total) by mouth daily.   Myrbetriq 25 MG Tb24 tablet Generic drug: mirabegron ER Take 25 mg by mouth daily.   ondansetron  4 MG tablet Commonly known as: ZOFRAN Take 1 tablet (4 mg total) by mouth every 6 (six) hours as needed for nausea.   Poly-Iron 150 Forte 150-25-1 MG-MCG-MG Caps Generic drug: Iron Polysacch Cmplx-B12-FA Take 1 capsule by mouth daily.   senna-docusate 8.6-50 MG tablet Commonly known as: Senokot-S Take 1 tablet by mouth 2 (two) times daily as needed.   sertraline 50 MG tablet Commonly known as: ZOLOFT Take 50 mg by mouth daily.   solifenacin 5 MG tablet Commonly known as: VESICARE Take 5 mg by mouth daily.   tamsulosin 0.4 MG Caps capsule Commonly known as: FLOMAX Take 0.4 mg by mouth daily.   Torsemide 40 MG Tabs Take 40 mg by mouth daily.   Vitamin D (Ergocalciferol) 1.25 MG (50000 UNIT) Caps capsule Commonly known as: DRISDOL Take 50,000 Units by mouth every 7 (seven) days.        Contact information for after-discharge care     Destination     [facility] .   Service: Skilled Nursing Contact information: [address] [place] [address] [phone]                    Discharge Exam: Ceasar Mons Weights  09/04/23 0335 09/05/23 0424 09/06/23 0500  Weight: 86.6 kg 83.4 kg 78.3 kg   Constitutional:  Normal appearance. Non toxic-appearing.  HENT: Head Normocephalic and atraumatic.  Mucous membranes are moist.  Eyes:  Extraocular intact. Conjunctivae normal. Pupils are equal, round, and reactive to light.  Cardiovascular: Rate and Rhythm: Normal rate and regular rhythm. Abdomen: Soft, nondistended, bowel sounds present, nontender Pulmonary: Non labored, symmetric rise of chest wall.  Musculoskeletal:  Normal range of motion.  Skin: warm and dry. not jaundiced.  Neurological: Alert. Oriented to self and situation Psychiatric: Mood and Affect congruent.    Condition at discharge: stable  The results of significant diagnostics from this hospitalization (including imaging, microbiology, ancillary and laboratory) are  listed below for reference.   Imaging Studies: DG Abd 1 View Result Date: 09/03/2023 CLINICAL DATA:  NG tube EXAM: ABDOMEN - 1 VIEW COMPARISON:  Abdominal x-ray 09/03/2023 FINDINGS: Findings nasogastric tube tip is in the proximal body of the stomach. No dilated bowel loops are seen today Left-sided pacemaker and cardiomegaly are stable. There is atelectasis in the right mid lung. IMPRESSION: 1. Nasogastric tube tip is in the proximal body of the stomach. Electronically Signed   By: Darliss Cheney M.D.   On: 09/03/2023 22:14   DG Abd 1 View Result Date: 09/03/2023 CLINICAL DATA:  Colonic obstruction. EXAM: ABDOMEN - 1 VIEW COMPARISON:  September 02, 2023. FINDINGS: Phleboliths are noted in the pelvis. Sigmoid colon dilatation is decreased compared to prior exam, now with maximum measured diameter of 8.6 cm. No other bowel dilatation is noted. Moderate amount of stool is seen in right and left colon and rectum. IMPRESSION: Moderate stool burden. Decreased dilatation of sigmoid colon compared to prior exam. No other bowel dilatation is noted. Electronically Signed   By: Lupita Raider M.D.   On: 09/03/2023 09:02   DG Abd 1 View Result Date: 09/02/2023 CLINICAL DATA:  [id number].  Ileus. EXAM: ABDOMEN - 1 VIEW COMPARISON:  CT 08/31/2023, abdomen film yesterday at 12:21 a.m. FINDINGS: There is worsening gas dilatation of the redundant sigmoid colon. Maximum distension is 11 cm, previously 9.2 cm. Correlate clinically to exclude worsening sigmoid obstruction. CT may be helpful. There is no appreciable small bowel dilatation. Moderate retained stool occupies the ascending, transverse and descending colon. There is no supine evidence of free air. Heavy vascular calcifications with no other significant calcifications. The bladder is catheterized and contains faint contrast. There is osteopenia, degenerative changes of the spine and a left hip arthroplasty. IMPRESSION: 1. Worsening gas dilatation of the redundant sigmoid colon.  Correlate clinically to exclude worsening sigmoid obstruction. CT may be helpful. 2. Moderate retained stool in the ascending, transverse and descending colon. 3. Heavy vascular calcifications. 4. These results will be called to the ordering clinician or representative by the Radiologist Assistant, and communication documented in the PACS or [employer]. Electronically Signed   By: Almira Bar M.D.   On: 09/02/2023 07:52   DG Abd Portable 1V Result Date: 09/02/2023 CLINICAL DATA:  [id number].  Check NGT positioning. EXAM: PORTABLE ABDOMEN - 1 VIEW COMPARISON:  Portable abdomen yesterday at 8:20 p.m., CT with IV contrast 08/31/2023. FINDINGS: 12:21 a.m. The tip of the NGT is in the midthoracic esophagus just above the level of the carina and needs to be advanced back into the stomach. Dilated gas distention in the redundant sigmoid colon is again present in the upper abdomen, appears similar. Maximum caliber 7.4 cm. No dilated small bowel segments are seen. In  the chest, there is a left chest dual lead pacing system with stable wire insertions, cardiomegaly and mild central vessel prominence, elevated right diaphragm and linear atelectasis in both bases. Calcified lymph node clusters are again noted in the mediastinum in the azygous region and AP window. The aorta is tortuous and calcified with stable visualized mediastinum. No new osseous findings. IMPRESSION: 1. The tip of the NGT is in the midthoracic esophagus just above the level of the carina and needs to be advanced back into the stomach. 2. Dilated gas distention in the redundant sigmoid colon appears similar. 3. Cardiomegaly and mild central vessel prominence. 4. Aortic atherosclerosis. 5. Stable calcified mediastinal lymph nodes. Electronically Signed   By: Almira Bar M.D.   On: 09/02/2023 00:46   DG Abd Portable 1V Result Date: 09/01/2023 CLINICAL DATA:  NG tube placement EXAM: PORTABLE ABDOMEN - 1 VIEW COMPARISON:  09/01/2023 FINDINGS: NG tube  tip is in the fundus of the stomach. Elevation of the right hemidiaphragm. Gaseous distention of the visualized colon in the upper abdomen. IMPRESSION: NG tube tip in the stomach. Electronically Signed   By: Charlett Nose M.D.   On: 09/01/2023 21:01   DG Abd 1 View Result Date: 09/01/2023 CLINICAL DATA:  Ileus, abdominal pain EXAM: ABDOMEN - 1 VIEW COMPARISON:  08/31/2023 FINDINGS: Three supine frontal views of the abdomen and pelvis are obtained. There is persistent gaseous distension of the large and small bowel, with the colon measuring up to 11 cm in diameter. Moderate retained stool throughout the colon again noted peer in there are no masses or abnormal calcifications. No acute bony abnormalities. IMPRESSION: 1. Continued gaseous distension of the large and small bowel, with large amount of retained stool within the colon. Findings are compatible with ileus versus distal obstruction. Electronically Signed   By: Sharlet Salina M.D.   On: 09/01/2023 19:01   CT ABDOMEN PELVIS W CONTRAST Result Date: 08/31/2023 CLINICAL DATA:  Acute nonlocalized abdominal pain. Abdominal distension EXAM: CT ABDOMEN AND PELVIS WITH CONTRAST TECHNIQUE: Multidetector CT imaging of the abdomen and pelvis was performed using the standard protocol following bolus administration of intravenous contrast. RADIATION DOSE REDUCTION: This exam was performed according to the departmental dose-optimization program which includes automated exposure control, adjustment of the mA and/or kV according to patient size and/or use of iterative reconstruction technique. CONTRAST:  OMNIPAQUE IOHEXOL 300 MG/ML  SOLN COMPARISON:  11/01/2022 FINDINGS: Lower chest: No acute abnormality. Hepatobiliary: Small left hepatic lobe cyst is unchanged. Unremarkable gallbladder and biliary tree. Pancreas: Unremarkable. Spleen: Unremarkable. Adrenals/Urinary Tract: Normal adrenal glands. Suprapubic catheter in the nondistended bladder. Left nephrectomy. No  urinary calculi or hydronephrosis in the right kidney or ureter. Stomach/Bowel: Gaseous dilation of the redundant sigmoid colon measuring up to 7.8 cm in diameter. No evidence of volvulus. There is tapering to the distal sigmoid colon where there is mild wall thickening (series 5/image 43). Additional wall thickening in the distal sigmoid colon and rectum. Moderate stool in the colon upstream from the sigmoid colon. Stomach is within normal limits. Appendix is not visualized. Vascular/Lymphatic: Aortic atherosclerosis. No enlarged abdominal or pelvic lymph nodes. Reproductive: No acute abnormality. Other: No free intraperitoneal fluid or air.  No abscess. Musculoskeletal: Left THA.  No acute fracture. IMPRESSION: 1. Gaseous dilation of the redundant sigmoid colon measuring up to 7.8 cm in diameter. There is tapering to the sigmoid colon where there is mild wall thickening. Findings favor ileus secondary to proctocolitis. 2. Suprapubic catheter in the nondistended bladder.  3. Aortic Atherosclerosis (ICD10-I70.0). Electronically Signed   By: Minerva Fester M.D.   On: 08/31/2023 19:30    Microbiology: Results for orders placed or performed during the hospital encounter of 08/31/23  Urine Culture (for pregnant, neutropenic or urologic patients or patients with an indwelling urinary catheter)     Status: Abnormal   Collection Time: 08/31/23  3:10 PM   Specimen: Urine, Random  Result Value Ref Range Status   Specimen Description   Final    URINE, RANDOM Performed at [facility], [address] [place], [place] [address]    Special Requests   Final    NONE Performed at [facility], [address], [place], [place] [address]    Culture >=100,000 COLONIES/mL PROTEUS MIRABILIS (A)  Final   Report Status 09/02/2023 FINAL  Final   Organism ID, Bacteria PROTEUS MIRABILIS (A)  Final      Susceptibility   Proteus mirabilis - MIC*    AMPICILLIN <=2 SENSITIVE Sensitive     CEFAZOLIN 8  SENSITIVE Sensitive     CEFEPIME <=0.12 SENSITIVE Sensitive     CEFTRIAXONE <=0.25 SENSITIVE Sensitive     CIPROFLOXACIN >=4 RESISTANT Resistant     GENTAMICIN <=1 SENSITIVE Sensitive     IMIPENEM 2 SENSITIVE Sensitive     NITROFURANTOIN 128 RESISTANT Resistant     TRIMETH/SULFA <=20 SENSITIVE Sensitive     AMPICILLIN/SULBACTAM <=2 SENSITIVE Sensitive     PIP/TAZO <=4 SENSITIVE Sensitive ug/mL    * >=100,000 COLONIES/mL PROTEUS MIRABILIS  Culture, blood (Routine X 2) w Reflex to ID Panel     Status: None   Collection Time: 08/31/23 10:50 PM   Specimen: BLOOD  Result Value Ref Range Status   Specimen Description BLOOD LEFT FOREARM  Final   Special Requests   Final    BOTTLES DRAWN AEROBIC ONLY Blood Culture results may not be optimal due to an inadequate volume of blood received in culture bottles   Culture   Final    NO GROWTH 5 DAYS Performed at [facility], [address] [place], [place] [address]    Report Status 09/05/2023 FINAL  Final  Culture, blood (Routine X 2) w Reflex to ID Panel     Status: None   Collection Time: 08/31/23 10:50 PM   Specimen: BLOOD  Result Value Ref Range Status   Specimen Description BLOOD LEFT ASSIST CONTROL  Final   Special Requests   Final    BOTTLES DRAWN AEROBIC ONLY Blood Culture results may not be optimal due to an inadequate volume of blood received in culture bottles   Culture   Final    NO GROWTH 5 DAYS Performed at [facility], [address] [place], [place] [address]    Report Status 09/05/2023 FINAL  Final  C Difficile Quick Screen w PCR reflex     Status: None   Collection Time: 09/01/23  1:51 PM   Specimen: STOOL  Result Value Ref Range Status   C Diff antigen NEGATIVE NEGATIVE Final   C Diff toxin NEGATIVE NEGATIVE Final   C Diff interpretation No C. difficile detected.  Final    Comment: Performed at [facility], [address] [place], [place] [address]    Labs: CBC: Recent Labs  Lab  09/02/23 0845 09/03/23 0517 09/04/23 0517 09/05/23 0513 09/06/23 0552  WBC 7.2 5.9 8.6 7.2 6.6  NEUTROABS  --  4.6 6.2 4.9 4.4  HGB 10.4* 10.6* 10.2* 10.6* 11.0*  HCT  33.5* 33.4* 32.2* 33.3* 35.5*  MCV 80.9 79.0* 78.5* 80.2 81.2  PLT 189 196 191 162 161   Basic Metabolic Panel: Recent Labs  Lab 08/31/23 1510 09/01/23 0443 09/02/23 0845 09/03/23 0517 09/04/23 0517 09/05/23 0513 09/06/23 0552  NA 138   < > 140 138 136 137 138  K 3.3*   < > 2.9* 4.0 3.2* 3.1* 3.5  CL 104   < > 108 108 107 106 106  CO2 24   < > 25 20* 22 24 22   GLUCOSE 122*   < > 71 90 68* 74 93  BUN 21   < > 17 15 19 11 9   CREATININE 0.89   < > 0.83 0.85 0.77 0.72 0.72  CALCIUM 8.7*   < > 8.2* 8.3* 8.2* 8.0* 8.3*  MG 2.3  --   --  2.1 2.1 2.0 2.0  PHOS  --   --   --  4.2 2.2* 2.6 2.4*   < > = values in this interval not displayed.   Liver Function Tests: Recent Labs  Lab 09/02/23 0845 09/03/23 0517 09/04/23 0517 09/05/23 0513 09/06/23 0552  AST 14* 13* 14* 14* 14*  ALT 10 11 11 8 7   ALKPHOS 60 57 51 52 52  BILITOT 0.7 1.2 0.6 1.0 0.7  PROT 5.9* 5.7* 5.7* 5.4* 5.6*  ALBUMIN 2.7* 2.7* 2.8* 2.7* 2.7*   CBG: Recent Labs  Lab 09/02/23 0737 09/03/23 0819 09/05/23 0846 09/05/23 1230 09/06/23 0829  GLUCAP 71 90 78 109* 99    Discharge time spent: 31 minutes.  Signed: Debarah Crape, DO [facility] 09/06/2023

## 2023-09-06 NOTE — Hospital Course (Addendum)
 Adam Rios. is 83 y.o. male with dementia, hypertension, hyperlipidemia, diastolic CHF, stroke, dementia, BPH, depression with anxiety, iron deficiency anemia, renal cell carcinoma, neurogenic bladder, suprapubic catheter, A-fib not on anticoagulation, presented with abdominal pain and distention.  CT abdomen pelvis revealed sigmoid colon distention favoring ileus secondary to proctocolitis.  NG tube was placed but distention progressively worsened on KUB.  Gastroenterology was consulted.  Patient underwent flex sigmoidoscopy on 4/4 which was unremarkable for stricture or acute findings.  Patient was started on aggressive bowel regimen and began having bowel movements.  Ultimately NG tube was removed on 4/5 and patient was tolerating full diet.  Stay was then further delayed pending return to his facility.  Today he is discharging directly to [facility].  He will need more aggressive bowel regimen moving forward to avoid constipation. Stay was briefly complicated by suprapubic catheter dysfunction.  Urology was consulted and replaced SPT on 4/4.  It is functioning appropriately now.      Ileus/sigmoid colon distention possible obstruction - Sigmoid colon initially measured 7.8 cm in diameter on CT.  Progressively worsened on KUB up to over 11 cm - Patient underwent flex sigmoidoscopy with gastroenterology which was unremarkable on 4/4 - NG tube placed 4/2, removed 4/5.  -- Cont bowel regimen - Initial CT concerning for proctocolitis.  Was initiated on Zosyn for this, no colitis seen on sigmoidoscopy with GI.  Discontinued Zosyn. - Avoid opioids if possible   Chronic diastolic CHF - Echo May 2024: EF 55 to 60% - Euvolemic at this time.  Suprapubic catheter dysfunction, resolved - Long-term catheter secondary to neurogenic bladder. -  Urology consulted and replaced SPT 4/4.  Functioning appropriately now.  No evidence of fistula   Iron deficiency anemia - Hemoglobin stable - Sigmoidoscopy  unremarkable.  Small amount of blood reported by bedside RN.  Hypertension - Continue daily monitoring, titrate meds as needed   Dyslipidemia - Continue statin    Hypokalemia - Replace as needed   History of CVA Dementia - Resume home meds - Mental status waxes and wanes - Continue frequent reorientation and delirium precautions   Neurogenic bladder Suprapubic catheter Proteus UTI --Sensitive to cefazolin. Sent will need to finish antibiotic course 4/8  Depression - Continue home meds

## 2023-09-15 ENCOUNTER — Other Ambulatory Visit: Payer: Self-pay

## 2023-09-15 ENCOUNTER — Emergency Department
Admission: EM | Admit: 2023-09-15 | Discharge: 2023-09-15 | Disposition: A | Discharge status: Home / Self Care | Payer: Medicare (Managed Care) | Attending: Emergency Medicine | Admitting: Emergency Medicine

## 2023-09-15 ENCOUNTER — Emergency Department: Payer: Medicare (Managed Care)

## 2023-09-15 DIAGNOSIS — W19XXXA Unspecified fall, initial encounter: Secondary | ICD-10-CM

## 2023-09-15 DIAGNOSIS — M542 Cervicalgia: Secondary | ICD-10-CM | POA: Diagnosis present

## 2023-09-15 DIAGNOSIS — I10 Essential (primary) hypertension: Secondary | ICD-10-CM | POA: Insufficient documentation

## 2023-09-15 DIAGNOSIS — M546 Pain in thoracic spine: Secondary | ICD-10-CM | POA: Diagnosis not present

## 2023-09-15 DIAGNOSIS — F0390 Unspecified dementia without behavioral disturbance: Secondary | ICD-10-CM | POA: Diagnosis not present

## 2023-09-15 DIAGNOSIS — W050XXA Fall from non-moving wheelchair, initial encounter: Secondary | ICD-10-CM | POA: Diagnosis not present

## 2023-09-15 DIAGNOSIS — N390 Urinary tract infection, site not specified: Secondary | ICD-10-CM | POA: Diagnosis not present

## 2023-09-15 LAB — URINALYSIS, W/ REFLEX TO CULTURE (INFECTION SUSPECTED)
Bilirubin Urine: NEGATIVE
Glucose, UA: NEGATIVE mg/dL
Ketones, ur: NEGATIVE mg/dL
Nitrite: POSITIVE — AB
Protein, ur: 100 mg/dL — AB
Specific Gravity, Urine: 1.026 (ref 1.005–1.030)
Squamous Epithelial / HPF: 0 /HPF (ref 0–5)
pH: 5 (ref 5.0–8.0)

## 2023-09-15 LAB — CBC WITH DIFFERENTIAL/PLATELET
Abs Immature Granulocytes: 0.03 10*3/uL (ref 0.00–0.07)
Basophils Absolute: 0 10*3/uL (ref 0.0–0.1)
Basophils Relative: 0 %
Eosinophils Absolute: 0.2 10*3/uL (ref 0.0–0.5)
Eosinophils Relative: 2 %
HCT: 38.5 % — ABNORMAL LOW (ref 39.0–52.0)
Hemoglobin: 11.9 g/dL — ABNORMAL LOW (ref 13.0–17.0)
Immature Granulocytes: 0 %
Lymphocytes Relative: 17 %
Lymphs Abs: 1.6 10*3/uL (ref 0.7–4.0)
MCH: 25.4 pg — ABNORMAL LOW (ref 26.0–34.0)
MCHC: 30.9 g/dL (ref 30.0–36.0)
MCV: 82.1 fL (ref 80.0–100.0)
Monocytes Absolute: 0.7 10*3/uL (ref 0.1–1.0)
Monocytes Relative: 7 %
Neutro Abs: 7.1 10*3/uL (ref 1.7–7.7)
Neutrophils Relative %: 74 %
Platelets: 250 10*3/uL (ref 150–400)
RBC: 4.69 MIL/uL (ref 4.22–5.81)
RDW: 18 % — ABNORMAL HIGH (ref 11.5–15.5)
WBC: 9.6 10*3/uL (ref 4.0–10.5)
nRBC: 0 % (ref 0.0–0.2)

## 2023-09-15 LAB — COMPREHENSIVE METABOLIC PANEL WITH GFR
ALT: 10 U/L (ref 0–44)
AST: 14 U/L — ABNORMAL LOW (ref 15–41)
Albumin: 3.3 g/dL — ABNORMAL LOW (ref 3.5–5.0)
Alkaline Phosphatase: 73 U/L (ref 38–126)
Anion gap: 7 (ref 5–15)
BUN: 13 mg/dL (ref 8–23)
CO2: 25 mmol/L (ref 22–32)
Calcium: 9.1 mg/dL (ref 8.9–10.3)
Chloride: 107 mmol/L (ref 98–111)
Creatinine, Ser: 0.82 mg/dL (ref 0.61–1.24)
GFR, Estimated: >60 mL/min (ref >60)
Glucose, Bld: 112 mg/dL — ABNORMAL HIGH (ref 70–99)
Potassium: 3.5 mmol/L (ref 3.5–5.1)
Sodium: 139 mmol/L (ref 135–145)
Total Bilirubin: 0.6 mg/dL (ref 0.0–1.2)
Total Protein: 7.1 g/dL (ref 6.5–8.1)

## 2023-09-15 LAB — TROPONIN I (HIGH SENSITIVITY)
Troponin I (High Sensitivity): 37 ng/L — ABNORMAL HIGH (ref <18)
Troponin I (High Sensitivity): 37 ng/L — ABNORMAL HIGH (ref <18)

## 2023-09-15 MED ORDER — CEFPODOXIME PROXETIL 100 MG/5ML PO SUSR
200.0000 mg | Freq: Once | ORAL | Status: AC
  Administered 2023-09-15: 200 mg via ORAL
  Filled 2023-09-15: qty 10

## 2023-09-15 MED ORDER — ACETAMINOPHEN 325 MG PO TABS
650.0000 mg | ORAL_TABLET | Freq: Once | ORAL | Status: AC
  Administered 2023-09-15: 650 mg via ORAL
  Filled 2023-09-15: qty 2

## 2023-09-15 MED ORDER — LIDOCAINE 5 % EX PTCH
1.0000 | MEDICATED_PATCH | CUTANEOUS | Status: DC
  Administered 2023-09-15: 1 via TRANSDERMAL
  Filled 2023-09-15: qty 1

## 2023-09-15 MED ORDER — LIDOCAINE 5 % EX PTCH: 1.0000 | MEDICATED_PATCH | CUTANEOUS | 0 refills | Status: AC

## 2023-09-15 MED ORDER — CEFPODOXIME PROXETIL 200 MG PO TABS: 200.0000 mg | ORAL_TABLET | Freq: Two times a day (BID) | ORAL | 0 refills | Status: AC

## 2023-09-15 NOTE — ED Notes (Signed)
 Tried calling [facility] x4 times. The first time, I spoke with [facility]. She advised she would transfer me to pt's unit. I was transferred and then hung up on. I called back x3 times and no one would answer the phone. Report given to transport.

## 2023-09-15 NOTE — ED Provider Notes (Signed)
 [facility] Provider Note    Event Date/Time   First MD Initiated Contact with Patient 09/15/23 1806     (approximate)   History   Fall   HPI  Adam Rios. is a 83 y.o. male history of hypertension, dementia, neurogenic bladder with an indwelling Foley, presenting after fall.  Patient is coming from a facility, had an unwitnessed fall.  Staff said that he fell out of his wheelchair, patient states that he fell out of bed.  Per EMS he was complaining about neck pain and upper back pain but on arrival to the ED, he was not complaining about pain to them anymore.  She denies any chest pain, shortness of breath, lightheadedness prior to the fall.  No pain anywhere else.  EMS did put a c-collar on him, the front piece is riding up his chin because patient is unable to tolerate it.  He denies any weakness or numbness.  Independent history obtained from EMS as above.  On my independent chart review, he was admitted in early April for proctocolitis, presented at that time with abdominal pain and distention, CT scan favored ileus secondary to proctocolitis.  Had a sigmoidoscopy that was done without evidence of colitis seen.  He denies any abdominal pain at this time.     Physical Exam   Triage Vital Signs: ED Triage Vitals  Encounter Vitals Group     BP      Systolic BP Percentile      Diastolic BP Percentile      Pulse      Resp      Temp      Temp src      SpO2      Weight      Height      Head Circumference      Peak Flow      Pain Score      Pain Loc      Pain Education      Exclude from Growth Chart     Most recent vital signs: Vitals:   09/15/23 1812 09/15/23 1814  BP:  129/82  Pulse:  80  Resp:  16  Temp:  98.2 F (36.8 C)  SpO2: 96% 97%     General: Awake, no distress.  Alert and oriented x 2 CV:  Good peripheral perfusion.  No thoracic cage tenderness Resp:  Normal effort.  No respiratory distress or increased work of  breathing Abd:  No distention.  Soft nontender Other:  No palpable skull deformities or tenderness, he is bilateral paracervical tenderness to palpation without midline spinal tenderness, able to range his neck, full range of motion of all extremities are intact without palpable deformities or tenderness, he has equal radial as well as DP pulses bilaterally.  Bilateral lower extremity edema that appears symmetrical.  No saddle anesthesia, his grip strength bilaterally is intact, no sensory deficits, able to dorsi and plantarflex bilaterally without focal weakness.   ED Results / Procedures / Treatments   Labs (all labs ordered are listed, but only abnormal results are displayed) Labs Reviewed  COMPREHENSIVE METABOLIC PANEL WITH GFR - Abnormal; Notable for the following components:      Result Value   Glucose, Bld 112 (*)    Albumin 3.3 (*)    AST 14 (*)    All other components within normal limits  CBC WITH DIFFERENTIAL/PLATELET - Abnormal; Notable for the following components:   Hemoglobin 11.9 (*)  HCT 38.5 (*)    MCH 25.4 (*)    RDW 18.0 (*)    All other components within normal limits  URINALYSIS, W/ REFLEX TO CULTURE (INFECTION SUSPECTED) - Abnormal; Notable for the following components:   Color, Urine YELLOW (*)    APPearance CLOUDY (*)    Hgb urine dipstick SMALL (*)    Protein, ur 100 (*)    Nitrite POSITIVE (*)    Leukocytes,Ua LARGE (*)    Bacteria, UA RARE (*)    All other components within normal limits  TROPONIN I (HIGH SENSITIVITY) - Abnormal; Notable for the following components:   Troponin I (High Sensitivity) 37 (*)    All other components within normal limits  TROPONIN I (HIGH SENSITIVITY) - Abnormal; Notable for the following components:   Troponin I (High Sensitivity) 37 (*)    All other components within normal limits  URINE CULTURE     EKG  EKG shows sinus rhythm, rate 77, normal QRS, normal QTc, no ischemic ST elevation, T wave flattening in 3, aVF,  V3, not significantly compared to prior   RADIOLOGY CT head on my independent interpretation without obvious intracranial hemorrhage   PROCEDURES:  Critical Care performed: No  Procedures   MEDICATIONS ORDERED IN ED: Medications  lidocaine (LIDODERM) 5 % 1 patch (1 patch Transdermal Patch Applied 09/15/23 1928)  acetaminophen (TYLENOL) tablet 650 mg (650 mg Oral Given 09/15/23 1927)  cefpodoxime (VANTIN) 100 MG/5ML suspension 200 mg (200 mg Oral Given 09/15/23 2001)     IMPRESSION / MDM / ASSESSMENT AND PLAN / ED COURSE  I reviewed the triage vital signs and the nursing notes.                              Differential diagnosis includes, but is not limited to, unwitnessed fall, will evaluate for fracture, intracranial hemorrhage, also evaluate for atypical ACS, UTI, electrolyte derangements.  Will get labs, EKG, troponin, chest x-ray, CT head, CT cervical spine.  Given that patient had no focal weakness or numbness, no saddle anesthesia, no mid spinal tenderness, range of motion of the neck is intact, c-collar was cleared.  Patient's presentation is most consistent with acute presentation with potential threat to life or bodily function.  Independent review of labs imaging below.  Patient was given a dose of cefotaxime here.  Will discharge him with p.o. antibiotics for his UTI.  Considered but no indication for inpatient admission at this time, he is well-appearing, vitals are stable.  Will also give him a prescription for Lidoderm patches.  He can take Tylenol every 6 hours as needed for his neck aching.  Will also have him follow-up with his primary care doctor for further management and follow-up.  Strict precautions given.  Discharged.  Clinical Course as of 09/15/23 2042  Wed Sep 15, 2023  1924 Urinalysis, w/ Reflex to Culture (Infection Suspected) -Urine, Catheterized(!) Consistent with UTI, on independent chart review, patient had prior positive urine cultures as susceptible  to ceftriaxone and cephalosporins.  Will give him a dose of cefpodox here. [TT]  1927 Independent review of labs, no leukocytosis, H&H stable, electrolytes are severely deranged, troponins mildly elevated but he is no chest pain at this time.  Will get a repeat troponin. [TT]  1941 CT Head Wo Contrast IMPRESSION: 1. No acute intracranial abnormality. 2. No acute displaced fracture or traumatic listhesis of the cervical spine. 3. Prominence of the lateral ventricles may  be related to central predominant atrophy, although a component of normal pressure/communicating hydrocephalus cannot be excluded.   [TT]  1941 DG Chest 2 View IMPRESSION: 1. Low lung volumes with no active cardiopulmonary disease. 2.  Aortic Atherosclerosis (ICD10-I70.0).   [TT]  2041 Troponin I (High Sensitivity)(!) Troponin x 2 stable. [TT]    Clinical Course User Index [TT] Drenda Gentle Richard Champion, MD     FINAL CLINICAL IMPRESSION(S) / ED DIAGNOSES   Final diagnoses:  Fall, initial encounter  Urinary tract infection without hematuria, site unspecified  Neck pain     Rx / DC Orders   ED Discharge Orders          Ordered    lidocaine (LIDODERM) 5 %  Every 24 hours        09/15/23 1945    cefpodoxime (VANTIN) 200 MG tablet  2 times daily        09/15/23 1945             Note:  This document was prepared using Dragon voice recognition software and may include unintentional dictation errors.    Shane Darling, MD 09/15/23 [phone]

## 2023-09-15 NOTE — ED Notes (Signed)
 Pt to xray

## 2023-09-15 NOTE — ED Notes (Signed)
Pt cleaned and brief changed

## 2023-09-15 NOTE — ED Triage Notes (Addendum)
 Pt arrives from [facility] via [facility] with c/o an unwitnessed fall from his wheelchair around 1700 today. Pt is followed by [facility] and they wanted him sent out for evaluation. Pt has a Hx of dementia and is an unreliable historian. Per EMS pt is not on thinners, did not hit his head and there was no LOC.

## 2023-09-15 NOTE — ED Notes (Signed)
 PT waiting on transport back to [facility]

## 2023-09-15 NOTE — Discharge Instructions (Addendum)
 Please take the antibiotics as prescribed for your urinary tract infection.  For your neck pain, you can use the Lidoderm patches that I prescribed, you can also take 500 mg of Tylenol every 6 hours as needed for the aching.  Please follow-up with your primary care doctor for further management of her symptoms.

## 2023-09-20 LAB — URINE CULTURE: Culture: >=100000 — AB

## 2023-10-23 ENCOUNTER — Emergency Department: Payer: Medicare (Managed Care)

## 2023-10-23 ENCOUNTER — Emergency Department
Admission: EM | Admit: 2023-10-23 | Discharge: 2023-10-24 | Disposition: A | Discharge status: Home / Self Care | Payer: Medicare (Managed Care) | Attending: Emergency Medicine | Admitting: Emergency Medicine

## 2023-10-23 ENCOUNTER — Encounter: Payer: Self-pay | Admitting: Pharmacy Technician

## 2023-10-23 ENCOUNTER — Other Ambulatory Visit: Payer: Self-pay

## 2023-10-23 DIAGNOSIS — I110 Hypertensive heart disease with heart failure: Secondary | ICD-10-CM | POA: Insufficient documentation

## 2023-10-23 DIAGNOSIS — W010XXA Fall on same level from slipping, tripping and stumbling without subsequent striking against object, initial encounter: Secondary | ICD-10-CM | POA: Insufficient documentation

## 2023-10-23 DIAGNOSIS — F0390 Unspecified dementia without behavioral disturbance: Secondary | ICD-10-CM | POA: Diagnosis not present

## 2023-10-23 DIAGNOSIS — M25562 Pain in left knee: Secondary | ICD-10-CM

## 2023-10-23 DIAGNOSIS — I509 Heart failure, unspecified: Secondary | ICD-10-CM | POA: Diagnosis not present

## 2023-10-23 DIAGNOSIS — W19XXXA Unspecified fall, initial encounter: Secondary | ICD-10-CM

## 2023-10-23 MED ORDER — LIDOCAINE 5 % EX PTCH
1.0000 | MEDICATED_PATCH | CUTANEOUS | Status: DC
  Administered 2023-10-23: 1 via TRANSDERMAL
  Filled 2023-10-23: qty 1

## 2023-10-23 MED ORDER — ACETAMINOPHEN 500 MG PO TABS
1000.0000 mg | ORAL_TABLET | Freq: Once | ORAL | Status: AC
  Administered 2023-10-23: 1000 mg via ORAL
  Filled 2023-10-23: qty 2

## 2023-10-23 NOTE — ED Triage Notes (Signed)
 Pt here via ems from [facility] after fall when trying to stand up out of wheelchair. Pt complaining of L leg pain. Did not hit head, not on anticags. Pt alert to self and situation. Staff reports this is baseline. VSS with ems.

## 2023-10-23 NOTE — ED Provider Notes (Signed)
 [name] Provider Note    Event Date/Time   First MD Initiated Contact with Patient 10/23/23 1742     (approximate)   History   Fall   HPI  Adam Rios. is a 83 y.o. male with history of dementia, hypertension, CHF, neurogenic bladder status post suprapubic cath, presenting after fall.  Patient states that he tried to get out of his wheelchair, tripped and fell onto his left knee.  States he was not lightheaded or had any chest pain shortness of breath prior to the fall.  He denies any weakness or numbness.  States that he did not hit his head.  Per EMS he is coming from [facility] after fall, was complaining about left leg pain, is not on any anticoagulation, he is alert to self and situation which staff had reported his baseline.  Independent history obtained from EMS as above.  EMS's history as well as patient history is consistent.  On independent chart review, he was admitted in April of this year for abdominal pain and distention, found to have ileus secondary to proctocolitis.  Had a flex sigmoidoscopy by GI that was unremarkable.  He denies any nausea, vomiting, diarrhea, abdominal pain at this time.     Physical Exam   Triage Vital Signs: ED Triage Vitals  Encounter Vitals Group     BP      Systolic BP Percentile      Diastolic BP Percentile      Pulse      Resp      Temp      Temp src      SpO2      Weight      Height      Head Circumference      Peak Flow      Pain Score      Pain Loc      Pain Education      Exclude from Growth Chart     Most recent vital signs: Vitals:   10/23/23 1815 10/23/23 1900  BP:  137/82  Pulse: 89 74  Resp:    Temp:    SpO2: 100% 98%     General: Awake, no distress.  CV:  Good peripheral perfusion.  Resp:  Normal effort.  No thoracic cage tenderness Abd:  No distention.  Soft nontender Other:  He has tenderness to his left knee, no tenderness to his left hip, femur, leg, ankle, feet.   No tenderness to palpation to his bilateral upper extremities or his right lower extremity, range of motion of his left knee is limited by pain.  No focal weakness, able to dorsi and plantarflex against resistance bilaterally, has equal strength to his upper extremities bilaterally.  No palpable skull deformities or tenderness, no midline spinal tenderness.   ED Results / Procedures / Treatments   Labs (all labs ordered are listed, but only abnormal results are displayed) Labs Reviewed - No data to display   RADIOLOGY On my independent interpretation, CT head without obvious intracranial hemorrhage   PROCEDURES:  Critical Care performed: No  Procedures   MEDICATIONS ORDERED IN ED: Medications  acetaminophen  (TYLENOL ) tablet 1,000 mg (1,000 mg Oral Given 10/23/23 1803)     IMPRESSION / MDM / ASSESSMENT AND PLAN / ED COURSE  I reviewed the triage vital signs and the nursing notes.  Differential diagnosis includes, but is not limited to, strain, sprain, contusion, fracture.  Will get x-ray of the hip as well as CT head and neck.  Tylenol .  Reassess.  Patient's presentation is most consistent with acute presentation with potential threat to life or bodily function.  Independent interpretation of imaging below.  Considered but no indication for inpatient admission at this time, he is safe for outpatient management.  Will discharge back to facility with strict return precautions.  Instructed to follow-up with his primary care doctor for further management of his symptoms.  Discharged.   Clinical Course as of 10/23/23 1924  Sat Oct 23, 2023  1833 DG Knee Complete 4 Views Left No acute displaced fracture or dislocation.  [TT]  1923 CT Head Wo Contrast IMPRESSION: 1. No acute intracranial abnormality. 2. No acute displaced fracture or traumatic listhesis of the cervical spine.   [TT]    Clinical Course User Index [TT] Drenda Gentle Richard Champion, MD      FINAL CLINICAL IMPRESSION(S) / ED DIAGNOSES   Final diagnoses:  Fall, initial encounter  Acute pain of left knee     Rx / DC Orders   ED Discharge Orders     None        Note:  This document was prepared using Dragon voice recognition software and may include unintentional dictation errors.    Shane Darling, MD 10/23/23 [phone]

## 2023-10-23 NOTE — Discharge Instructions (Signed)
 He can take 650 mg of Tylenol  every 6 hours as needed for pain.

## 2023-10-23 NOTE — ED Notes (Signed)
 Called to [facility] @ 735pm per RN Destiny/Transport to [facility].

## 2023-10-24 NOTE — ED Notes (Signed)
 PT stated that he doesn't want to go home due to not being able to move without having Left leg pain

## 2023-11-21 ENCOUNTER — Emergency Department

## 2023-11-21 ENCOUNTER — Other Ambulatory Visit: Payer: Self-pay

## 2023-11-21 ENCOUNTER — Encounter: Payer: Self-pay | Admitting: Emergency Medicine

## 2023-11-21 ENCOUNTER — Inpatient Hospital Stay
Admission: EM | Admit: 2023-11-21 | Discharge: 2023-11-30 | DRG: 389 | Disposition: A | Discharge status: Hospice | Source: Skilled Nursing Facility | Attending: Osteopathic Medicine | Admitting: Osteopathic Medicine

## 2023-11-21 DIAGNOSIS — Z85528 Personal history of other malignant neoplasm of kidney: Secondary | ICD-10-CM | POA: Diagnosis not present

## 2023-11-21 DIAGNOSIS — Y738 Miscellaneous gastroenterology and urology devices associated with adverse incidents, not elsewhere classified: Secondary | ICD-10-CM | POA: Diagnosis not present

## 2023-11-21 DIAGNOSIS — R140 Abdominal distension (gaseous): Secondary | ICD-10-CM

## 2023-11-21 DIAGNOSIS — Z515 Encounter for palliative care: Secondary | ICD-10-CM

## 2023-11-21 DIAGNOSIS — Z981 Arthrodesis status: Secondary | ICD-10-CM

## 2023-11-21 DIAGNOSIS — Z66 Do not resuscitate: Secondary | ICD-10-CM | POA: Diagnosis present

## 2023-11-21 DIAGNOSIS — E785 Hyperlipidemia, unspecified: Secondary | ICD-10-CM | POA: Diagnosis present

## 2023-11-21 DIAGNOSIS — E162 Hypoglycemia, unspecified: Secondary | ICD-10-CM | POA: Diagnosis not present

## 2023-11-21 DIAGNOSIS — T83090A Other mechanical complication of cystostomy catheter, initial encounter: Secondary | ICD-10-CM | POA: Diagnosis not present

## 2023-11-21 DIAGNOSIS — Z87891 Personal history of nicotine dependence: Secondary | ICD-10-CM | POA: Diagnosis not present

## 2023-11-21 DIAGNOSIS — K5981 Ogilvie syndrome: Principal | ICD-10-CM | POA: Diagnosis present

## 2023-11-21 DIAGNOSIS — D72829 Elevated white blood cell count, unspecified: Secondary | ICD-10-CM | POA: Diagnosis present

## 2023-11-21 DIAGNOSIS — Z87440 Personal history of urinary (tract) infections: Secondary | ICD-10-CM

## 2023-11-21 DIAGNOSIS — Z96642 Presence of left artificial hip joint: Secondary | ICD-10-CM | POA: Diagnosis present

## 2023-11-21 DIAGNOSIS — Z7401 Bed confinement status: Secondary | ICD-10-CM

## 2023-11-21 DIAGNOSIS — Z905 Acquired absence of kidney: Secondary | ICD-10-CM

## 2023-11-21 DIAGNOSIS — I10 Essential (primary) hypertension: Secondary | ICD-10-CM | POA: Diagnosis not present

## 2023-11-21 DIAGNOSIS — R000 Tachycardia, unspecified: Secondary | ICD-10-CM | POA: Diagnosis not present

## 2023-11-21 DIAGNOSIS — Z978 Presence of other specified devices: Secondary | ICD-10-CM | POA: Diagnosis not present

## 2023-11-21 DIAGNOSIS — N319 Neuromuscular dysfunction of bladder, unspecified: Secondary | ICD-10-CM | POA: Diagnosis present

## 2023-11-21 DIAGNOSIS — K567 Ileus, unspecified: Principal | ICD-10-CM | POA: Diagnosis present

## 2023-11-21 DIAGNOSIS — I441 Atrioventricular block, second degree: Secondary | ICD-10-CM | POA: Diagnosis present

## 2023-11-21 DIAGNOSIS — Z79899 Other long term (current) drug therapy: Secondary | ICD-10-CM

## 2023-11-21 DIAGNOSIS — F03911 Unspecified dementia, unspecified severity, with agitation: Secondary | ICD-10-CM | POA: Diagnosis not present

## 2023-11-21 DIAGNOSIS — E876 Hypokalemia: Secondary | ICD-10-CM | POA: Diagnosis present

## 2023-11-21 DIAGNOSIS — I5032 Chronic diastolic (congestive) heart failure: Secondary | ICD-10-CM | POA: Diagnosis present

## 2023-11-21 DIAGNOSIS — Z885 Allergy status to narcotic agent status: Secondary | ICD-10-CM

## 2023-11-21 DIAGNOSIS — F0390 Unspecified dementia without behavioral disturbance: Secondary | ICD-10-CM | POA: Insufficient documentation

## 2023-11-21 DIAGNOSIS — E860 Dehydration: Secondary | ICD-10-CM | POA: Diagnosis present

## 2023-11-21 DIAGNOSIS — Q438 Other specified congenital malformations of intestine: Secondary | ICD-10-CM

## 2023-11-21 DIAGNOSIS — K592 Neurogenic bowel, not elsewhere classified: Secondary | ICD-10-CM | POA: Diagnosis present

## 2023-11-21 DIAGNOSIS — R9431 Abnormal electrocardiogram [ECG] [EKG]: Secondary | ICD-10-CM | POA: Insufficient documentation

## 2023-11-21 DIAGNOSIS — D500 Iron deficiency anemia secondary to blood loss (chronic): Secondary | ICD-10-CM | POA: Diagnosis present

## 2023-11-21 DIAGNOSIS — R109 Unspecified abdominal pain: Secondary | ICD-10-CM | POA: Diagnosis present

## 2023-11-21 DIAGNOSIS — I110 Hypertensive heart disease with heart failure: Secondary | ICD-10-CM | POA: Diagnosis present

## 2023-11-21 DIAGNOSIS — Z950 Presence of cardiac pacemaker: Secondary | ICD-10-CM | POA: Diagnosis not present

## 2023-11-21 DIAGNOSIS — I4891 Unspecified atrial fibrillation: Secondary | ICD-10-CM | POA: Diagnosis present

## 2023-11-21 DIAGNOSIS — Z8673 Personal history of transient ischemic attack (TIA), and cerebral infarction without residual deficits: Secondary | ICD-10-CM | POA: Diagnosis not present

## 2023-11-21 DIAGNOSIS — Z888 Allergy status to other drugs, medicaments and biological substances status: Secondary | ICD-10-CM

## 2023-11-21 LAB — COMPREHENSIVE METABOLIC PANEL WITH GFR
ALT: 11 U/L (ref 0–44)
AST: 13 U/L — ABNORMAL LOW (ref 15–41)
Albumin: 2.8 g/dL — ABNORMAL LOW (ref 3.5–5.0)
Alkaline Phosphatase: 122 U/L (ref 38–126)
Anion gap: 9 (ref 5–15)
BUN: 14 mg/dL (ref 8–23)
CO2: 21 mmol/L — ABNORMAL LOW (ref 22–32)
Calcium: 8.3 mg/dL — ABNORMAL LOW (ref 8.9–10.3)
Chloride: 106 mmol/L (ref 98–111)
Creatinine, Ser: 0.7 mg/dL (ref 0.61–1.24)
GFR, Estimated: >60 mL/min (ref >60)
Glucose, Bld: 102 mg/dL — ABNORMAL HIGH (ref 70–99)
Potassium: 2.7 mmol/L — CL (ref 3.5–5.1)
Sodium: 136 mmol/L (ref 135–145)
Total Bilirubin: 0.8 mg/dL (ref 0.0–1.2)
Total Protein: 6.4 g/dL — ABNORMAL LOW (ref 6.5–8.1)

## 2023-11-21 LAB — CBC WITH DIFFERENTIAL/PLATELET
Abs Immature Granulocytes: 0.04 10*3/uL (ref 0.00–0.07)
Basophils Absolute: 0 10*3/uL (ref 0.0–0.1)
Basophils Relative: 0 %
Eosinophils Absolute: 0.2 10*3/uL (ref 0.0–0.5)
Eosinophils Relative: 2 %
HCT: 37.1 % — ABNORMAL LOW (ref 39.0–52.0)
Hemoglobin: 11.7 g/dL — ABNORMAL LOW (ref 13.0–17.0)
Immature Granulocytes: 0 %
Lymphocytes Relative: 22 %
Lymphs Abs: 2.1 10*3/uL (ref 0.7–4.0)
MCH: 25.6 pg — ABNORMAL LOW (ref 26.0–34.0)
MCHC: 31.5 g/dL (ref 30.0–36.0)
MCV: 81.2 fL (ref 80.0–100.0)
Monocytes Absolute: 0.7 10*3/uL (ref 0.1–1.0)
Monocytes Relative: 7 %
Neutro Abs: 6.8 10*3/uL (ref 1.7–7.7)
Neutrophils Relative %: 69 %
Platelets: 268 10*3/uL (ref 150–400)
RBC: 4.57 MIL/uL (ref 4.22–5.81)
RDW: 16.4 % — ABNORMAL HIGH (ref 11.5–15.5)
WBC: 9.9 10*3/uL (ref 4.0–10.5)
nRBC: 0 % (ref 0.0–0.2)

## 2023-11-21 LAB — MAGNESIUM: Magnesium: 2.1 mg/dL (ref 1.7–2.4)

## 2023-11-21 LAB — LIPASE, BLOOD: Lipase: 22 U/L (ref 11–51)

## 2023-11-21 MED ORDER — MORPHINE SULFATE (PF) 4 MG/ML IV SOLN
4.0000 mg | Freq: Once | INTRAVENOUS | Status: AC
  Administered 2023-11-21: 4 mg via INTRAVENOUS
  Filled 2023-11-21: qty 1

## 2023-11-21 MED ORDER — KCL IN DEXTROSE-NACL 40-5-0.9 MEQ/L-%-% IV SOLN
INTRAVENOUS | Status: AC
  Filled 2023-11-21 (×4): qty 1000

## 2023-11-21 MED ORDER — POTASSIUM CHLORIDE 10 MEQ/100ML IV SOLN
10.0000 meq | INTRAVENOUS | Status: AC
  Administered 2023-11-21 – 2023-11-22 (×4): 10 meq via INTRAVENOUS
  Filled 2023-11-21: qty 100

## 2023-11-21 MED ORDER — ACETAMINOPHEN 325 MG RE SUPP: 650.0000 mg | Freq: Four times a day (QID) | RECTAL | Status: DC | PRN

## 2023-11-21 MED ORDER — ACETAMINOPHEN 325 MG PO TABS
650.0000 mg | ORAL_TABLET | Freq: Four times a day (QID) | ORAL | Status: DC | PRN
  Administered 2023-11-23: 650 mg via ORAL
  Filled 2023-11-21: qty 2

## 2023-11-21 MED ORDER — FESOTERODINE FUMARATE ER 4 MG PO TB24
4.0000 mg | ORAL_TABLET | Freq: Every day | ORAL | Status: DC
  Administered 2023-11-21 – 2023-11-25 (×5): 4 mg via ORAL
  Filled 2023-11-21 (×5): qty 1

## 2023-11-21 MED ORDER — IOHEXOL 300 MG/ML  SOLN
100.0000 mL | Freq: Once | INTRAMUSCULAR | Status: AC | PRN
  Administered 2023-11-21: 100 mL via INTRAVENOUS

## 2023-11-21 MED ORDER — MAGNESIUM SULFATE 2 GM/50ML IV SOLN: 2.0000 g | Freq: Once | INTRAVENOUS | Status: DC

## 2023-11-21 MED ORDER — ONDANSETRON HCL 4 MG/2ML IJ SOLN
4.0000 mg | Freq: Four times a day (QID) | INTRAMUSCULAR | Status: DC | PRN
Start: 2023-11-21 — End: 2023-11-26

## 2023-11-21 MED ORDER — ONDANSETRON HCL 4 MG PO TABS: 4.0000 mg | ORAL_TABLET | Freq: Four times a day (QID) | ORAL | Status: DC | PRN

## 2023-11-21 MED ORDER — POTASSIUM CHLORIDE 10 MEQ/100ML IV SOLN
10.0000 meq | Freq: Once | INTRAVENOUS | Status: AC
  Administered 2023-11-21: 10 meq via INTRAVENOUS
  Filled 2023-11-21: qty 100

## 2023-11-21 MED ORDER — ONDANSETRON HCL 4 MG/2ML IJ SOLN
4.0000 mg | Freq: Once | INTRAMUSCULAR | Status: AC
  Administered 2023-11-21: 4 mg via INTRAVENOUS
  Filled 2023-11-21: qty 2

## 2023-11-21 MED ORDER — FINASTERIDE 5 MG PO TABS
5.0000 mg | ORAL_TABLET | Freq: Every day | ORAL | Status: DC
  Administered 2023-11-22 – 2023-11-26 (×5): 5 mg via ORAL
  Filled 2023-11-21 (×5): qty 1

## 2023-11-21 MED ORDER — ENOXAPARIN SODIUM 40 MG/0.4ML IJ SOSY
40.0000 mg | PREFILLED_SYRINGE | INTRAMUSCULAR | Status: DC
  Administered 2023-11-22 – 2023-11-29 (×8): 40 mg via SUBCUTANEOUS
  Filled 2023-11-21 (×8): qty 0.4

## 2023-11-21 NOTE — Assessment & Plan Note (Signed)
-   Hold sertraline

## 2023-11-21 NOTE — Assessment & Plan Note (Signed)
 Blood pressure normal - Hold torsemide , spironolactone , metoprolol  for now

## 2023-11-21 NOTE — Assessment & Plan Note (Signed)
 Indwelling SP catheter in place.  No fever or leukocytosis - Continue Vesicare, finasteride  - Hold Flomax , Myrbetriq  for now

## 2023-11-21 NOTE — ED Triage Notes (Addendum)
 Pt coming from [facility] via [facility] with reports of abdominal pain. Per EMS pt had imaging done on Friday that showed volvulus. Facility reports pt has been having jelly like stools.

## 2023-11-21 NOTE — H&P (Signed)
 History and Physical    Patient: Adam Rios. FMW:[id number] DOB: 06-Sep-1940 DOA: 11/21/2023 DOS: the patient was seen and examined on 11/21/2023 PCP: [facility]  Patient coming from: SNF  Chief Complaint:  Chief Complaint  Patient presents with   Abdominal Pain       HPI:  83 y.o. 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  Exam limited by patient dementia, history collected via EMS/EDP.  Evidently developed constipation again several weeks ago.  Then in the last few days, his belly became distended, painful.  An x-ray at his facility two days ago showed volvulus reportedly.  He was having jelly like stools.  So he was sent to the ER.  In the ER, K 2.7 other electrolyes and renal function normal.    CT showed ileus.  EDP spoke with Dr. Marinda General Surgery and Dr. Therisa GI.  The consensus is that it may be ileus or (given degree of distension on CT) mild colonic pseudoobstruction, will attempt to manage conservatively.           Review of Systems  Constitutional:  Negative for fever.  Gastrointestinal:  Positive for abdominal pain and constipation. Negative for vomiting.  All other systems reviewed and are negative. ROS unreliable due to dementia      Past Medical History:  Diagnosis Date   Cognitive impairment    Complication of anesthesia    Fentanyl  causes nausea   Hemorrhagic cerebrovascular accident (CVA) (HCC)    Hypertension    Neurogenic bladder    Renal cell carcinoma (HCC) 2005   Renal disorder    Stroke (HCC)     01/17/18, 12/21   Some weakness and cognitive decline   Past Surgical History:  Procedure Laterality Date   APPENDECTOMY     CATARACT EXTRACTION W/PHACO Right 07/22/2020   Procedure: CATARACT EXTRACTION PHACO AND INTRAOCULAR LENS PLACEMENT (IOC) RIGHT;  Surgeon: Myrna Adine Anes, MD;  Location: [facility];  Service: Ophthalmology;  Laterality: Right;  4.77 0:44.2   CATARACT EXTRACTION W/PHACO Left 08/05/2020   Procedure: CATARACT EXTRACTION PHACO AND INTRAOCULAR LENS PLACEMENT (IOC) LEFT 10.73 01:13.8;  Surgeon: Myrna Adine Anes, MD;  Location: [facility];  Service: Ophthalmology;  Laterality: Left;  Use eye stretcher not chair   FLEXIBLE SIGMOIDOSCOPY N/A 09/02/2023   Procedure: KINGSTON SIDE;  Surgeon: Jinny Carmine, MD;  Location: [facility];  Service: Endoscopy;  Laterality: N/A;   HERNIA REPAIR     HIP ARTHROPLASTY Left 11/28/2022   Procedure: ARTHROPLASTY BIPOLAR HIP (HEMIARTHROPLASTY);  Surgeon: Tobie Priest, MD;  Location: [facility];  Service: Orthopedics;  Laterality: Left;   IR CATHETER TUBE CHANGE  06/15/2023   LUMBAR FUSION     NEPHRECTOMY Left 2005   TEMPORARY PACEMAKER N/A 05/03/2022   Procedure: TEMPORARY PACEMAKER;  Surgeon: Florencio Cara BIRCH, MD;  Location: [facility];  Service: Cardiovascular;  Laterality: N/A;   Social History:  reports that he quit smoking about 20 years ago. His smoking use included cigarettes. He started smoking about 30 years ago. He has a 2.5 pack-year smoking history. He has never used smokeless tobacco. He reports that he does not currently use alcohol . He reports that he does not use drugs.  Allergies  Allergen Reactions   Empagliflozin     Recurrent UTI with sepsis on jardiance.  Fentanyl  Nausea Only   Oxycodone  Nausea And Vomiting    Family History  Problem Relation Age of Onset   Cerebral aneurysm Father    Cerebral aneurysm Sister     Prior to Admission medications   Medication Sig Start Date End Date Taking? Authorizing Provider  Acetaminophen  Extra Strength 500 MG TABS Take 1,000 mg by mouth 3 (three) times daily as needed (PAIN).   Yes [provider]  acetic acid 0.25 % irrigation Apply 1 Application topically as needed. 08/18/23  Yes [provider]  atorvastatin  (LIPITOR) 40 MG tablet Take 40 mg by mouth daily.   Yes [provider]  bisacodyl  (DULCOLAX) 10 MG suppository Place 10 mg rectally daily as needed. 11/19/23  Yes [provider]  CALPROTECT 0.44-20.6 % OINT Apply 1 Application topically 2 (two) times daily. 06/03/23  Yes [provider]  cyclobenzaprine  (FLEXERIL ) 5 MG tablet Take 5 mg by mouth 2 (two) times daily as needed. 11/19/23  Yes [provider]  docusate sodium  (COLACE) 100 MG capsule Take 1 capsule (100 mg total) by mouth 2 (two) times daily. 12/04/22  Yes Maree Hue, MD  finasteride  (PROSCAR ) 5 MG tablet Take 5 mg by mouth daily. 04/27/22  Yes [provider]  gabapentin  (NEURONTIN ) 300 MG capsule Take 2 capsules (600 mg total) by mouth 2 (two) times daily. 06/16/23  Yes Wieting, Richard, MD  hydrocortisone  cream ([employer] CORTISONE MAXIMUM STRENGTH) 1 % Apply 1 Application topically as needed for itching.   Yes [provider]  lactulose  (CHRONULAC ) 10 GM/15ML solution Take 15 mLs (10 g total) by mouth 2 (two) times daily. Titrate to have at least one bowel movement daily 09/06/23  Yes Dezii, Alexandra, DO  metoprolol  succinate (TOPROL -XL) 25 MG 24 hr tablet Take 0.5 tablets (12.5 mg total) by mouth daily. 06/16/23  Yes Wieting, Richard, MD  MYRBETRIQ  25 MG TB24 tablet Take 25 mg by mouth daily. 06/02/23  Yes [provider]  pantoprazole  (PROTONIX ) 20 MG tablet Take 20 mg by mouth daily. 11/19/23  Yes [provider]  POLY-IRON  150 FORTE 150-25-1 MG-MCG-MG CAPS Take 1 capsule by mouth daily. 06/02/23  Yes [provider]  senna-docusate (SENOKOT-S) 8.6-50 MG tablet Take 1 tablet by mouth 2 (two) times daily as needed. 08/15/22  Yes Wieting, Richard, MD  sertraline  (ZOLOFT ) 50 MG tablet Take 50 mg by mouth daily. 06/02/23  Yes [provider]  solifenacin (VESICARE) 5 MG tablet Take 5 mg by mouth daily.   Yes [provider]   spironolactone  (ALDACTONE ) 25 MG tablet Take 25 mg by mouth daily. 11/19/23  Yes [provider]  tamsulosin  (FLOMAX ) 0.4 MG CAPS capsule Take 0.4 mg by mouth daily. 10/30/22  Yes [provider]  vitamin B-12 (CYANOCOBALAMIN ) 1000 MCG tablet Take 1,000 mcg by mouth daily.   Yes [provider]  Vitamin D , Ergocalciferol , (DRISDOL ) 1.25 MG (50000 UNIT) CAPS capsule Take 50,000 Units by mouth every 7 (seven) days. 06/02/23  Yes [provider]  Zinc Oxide 13 % CREA Apply topically.   Yes [provider]  metoCLOPramide  (REGLAN ) 5 MG tablet Take 1-2 tablets (5-10 mg total) by mouth every 8 (eight) hours as needed for nausea (if ondansetron  (ZOFRAN ) ineffective.). Patient not taking: Reported on 11/21/2023 12/04/22   Maree Hue, MD  ondansetron  (ZOFRAN ) 4 MG tablet Take 1 tablet (4 mg total) by mouth every 6 (six) hours as needed for nausea. 12/04/22   Maree Hue, MD  torsemide  40  MG TABS Take 40 mg by mouth daily. 12/05/22   Maree Hue, MD    Physical Exam: Vitals:   11/21/23 1732 11/21/23 1919  BP: (!) 130/91 125/82  Pulse: 98 73  Resp: 20 12  Temp: 97.8 F (36.6 C) 98 F (36.7 C)  TempSrc: Oral Oral  SpO2: 94% 97%   Elderly adult male, lying in bed, appears weak and tired Sclera anicteric, conjunctiva pink, lids and lashes normal.  No nasal deformity, discharge, or epistaxis.  Oropharynx very dry, no oral lesions, edentulous, lips red and cracked RRR, no murmurs, no peripheral pitting edema, JVP not visible Respiratory rate seems normal, lung sounds diminished but no rales or wheezes appreciated Abdomen very taut and distended, mild tenderness throughout, no guarding, no rebound Makes eye contact, responds very slowly to questions, oriented to self only, able to be reoriented to hospital, but cannot volunteer the place, city, state, or month.  Generalized weakness, severe, speech fluent but psychomotor slowing present.    Data  Reviewed: Discussed with gastroenterology, Dr. Therisa CT abdomen and pelvis report reviewed, dilated colon, personally reviewed, shows colon up to around 9-10 cm EKG, personally reviewed, shows what is probably a paced rhythm, RBBB pattern Basic metabolic panel reviewed, shows hypokalemia, normal renal function CBC reviewed shows mild anemia, no leukocytosis    Assessment and Plan: * Acute pseudo-obstruction of colon CT shows colon dilated to 8-10cm, no clear obstruction, scant stool.  Suspect from hypokalemia, Myrbetric in setting of neurogenic bladder/colon.  No evidence of infection. 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  No fever or leukocytosis - Continue Vesicare, finasteride  - Hold Flomax , Myrbetriq  for now  Essential hypertension Blood pressure normal - Hold torsemide , spironolactone , metoprolol  for now         Advance Care Planning: DNR, limited interventions number desired, MOST form reviewed, confirmed with daughter  Consults: GI, Dr. Therisa will see patient tomorrow General Surgery, Dr. Marinda  discussed case with ER, no surgery required emergently, if this is Ogilvie's, no clear role for surgical management  Family Communication: Daughter by phone  Severity of Illness: The appropriate patient status for this patient is INPATIENT. Inpatient status is judged to be reasonable and necessary in order to provide the required intensity of service to ensure the patient's safety. The patient's presenting symptoms, physical exam findings, and initial radiographic and laboratory data in the context of their chronic comorbidities is felt to place them at high risk for further clinical deterioration. Furthermore, it is not anticipated that the patient will be medically stable for discharge from the hospital within 2 midnights of admission.   * I certify that  at the point of admission it is my clinical judgment that the patient will require inpatient hospital care spanning beyond 2 midnights from the point of admission due to high intensity of service, high risk for further deterioration and high frequency of surveillance required.*  Author: Lonni SHAUNNA Dalton, MD 11/21/2023 7:58 PM  For on call review [email or web address].

## 2023-11-21 NOTE — Assessment & Plan Note (Signed)
-   Hold Lipitor for now 

## 2023-11-21 NOTE — Assessment & Plan Note (Signed)
-   Add on Mag - Aggressively supplement K

## 2023-11-21 NOTE — Assessment & Plan Note (Signed)
 Appears dehydrated - Hold spironolactone , torsemide , metoprolol 

## 2023-11-21 NOTE — Hospital Course (Addendum)
 Hospital course / significant events:   HPI: 83 y.o. 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 Evidently developed constipation several weeks ago. Then in the last few days, his belly became distended, painful. An x-ray at his facility two days prior to ED showed volvulus reportedly. He was having jelly like stools. So he was sent to the ER.   06/22: to ED, CT showed ileus. EDP spoke with Dr. Marinda General Surgery and Dr. Therisa GI. The consensus is that it may be ileus or (given degree of distension on CT) mild colonic pseudoobstruction, will attempt to manage conservatively. Admitted to hospiatlist  06/23: Had BM Overnight per RN. Per gen surg - history and exam consistent with Ogilvie syndrome, recs rectal tube placement to decompress, question whether candidate for atropine, would defer to GI. Per GI - keep K over 4 and Mg over 2, agree rectal tube, consider decompression colonoscopy but defer for now given likelihood of recurrence will try to maintatin conservative tx 06/24: not much improvement, plan KUB tomorrow am 06/25: KUB showing minimal improvement, some bowel function 06/26: pt reports feeling improved, not eating much, gradually more somnolent and confused over past couple days, SLP to see. Suprapubic cath exchanged (clogged and unable to flush). D/w daughter - will monitor for now, if not eating through the weekend will need to consider hospice 06/27: Palliative to see. Pt sleepy this AM, but rouses to voice and answers some yes/no questions. Sinus tach, will trial IV fluids, pending palliative d/w family today  06/28: a bit more alert today, still not eating much,      Consultants:  General Surgery Gastroenterology Palliative Care   Procedures/Surgeries: none      ASSESSMENT & PLAN:   Ileus /  Pseudoobstruction  Expect neostigmine will be contraindicated in this case given his cardiac and urinary history, and carry significant risk regardless given his age and functional status.  no evidence of bowel compromise on imaging nor examination at this time. At this point, he would require subtotal colectomy and end ileostomy creation which would carry significant morbidity and mortality in his situation  Advance diet as tolerated / as able but pt is reluctant to eat  Rectal tube per GI  Optimize electrolytes - keep K over 4 and Mg over 2 Ok for up w/ PT/OT Palliative consult - see GOC below   Hypoglycemia While NPO D5NS fluids D50 amp prn  Monitor CLG as needed / Glc w/ am labs  Goals of care: Discussed today w/ his daughter Adam Rios over the phone 11/25/23 4:47 PM. Discussed that he is still not really moving bowels much, he is uncomfortable, not eating well, more confused/agitated. She shares that he has declined in his cognitive and functional status over the past few months.  Discussed that plan for now would be to continue treatment as we are doing and hopefully he will improve but even if he does this issue will likely recur and he will continue to decline especially if he is not meeting his nutritional needs. If he declines further while here and/or if he is not eating much through the weekend would strongly consider comfort measures vs full scope treatment. She is agreeable to this plan for now.  Palliative consult to discuss further, daughter is open to discussions about possibly avoiding hospital, consideration for  hospice, etc    Hypokalemia Replace as needed Monitor BMP Pt has been refusing K po supplementation   Dementia without behavioral disturbance  hold sertraline    Sinus tach Long QT VSS otherwise Trial fluids   Iron  deficiency anemia due to chronic blood loss Hemoglobin stable relative to baseline, no bleeding reported Monitor CBC   Chronic indwelling  suprapubic catheter secondary to chronic neurogenic bladder Exchanged 11/25/23 (clogged and unable to flush) Continue Vesicare, finasteride  Hold Flomax , Myrbetriq  for now in light of reduced colonic transit   Leukocytosis: resolved likely secondary to lieus, reactive will trend   Chronic diastolic CHF (congestive heart failure)  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 Significantly low or high BMI is associated with higher medical risk.  Underweight - under 18  overweight - 25 to 29 obese - 30 or more Class 1 obesity: BMI of 30.0 to 34 Class 2 obesity: BMI of 35.0 to 39 Class 3 obesity: BMI of 40.0 to 49 Super Morbid Obesity: BMI 50-59 Super-super Morbid Obesity: BMI 60+ Healthy nutrition and physical activity advised as adjunct to other disease management and risk reduction treatments    DVT prophylaxis: lovenox  IV fluids: NS 1L today  Nutrition: clears advance per gen surg / GI  Central lines / other devices: rectal tube, chronic suprapubic catheter   Code Status: DNR ACP documentation reviewed: none on file in [facility]  TOC needs: TBD Medical barriers to dispo: still not demonstrating adequate bowel function / po tolerance. Expected medical readiness for discharge next 2 days .

## 2023-11-21 NOTE — Assessment & Plan Note (Signed)
 CT shows colon dilated to 8-10cm, no clear obstruction, scant stool.  Suspect from hypokalemia, Myrbetric in setting of neurogenic bladder/colon.  No evidence of infection. - IV fluids - Aggressive supplement K - Empiric magnesium  sulfate while Mag level pending - NPO except ice chips tonight - Hold diuretics - Would like to avoid opiates - Repeat x-ray in AM - If x-ray improving, or bowel function resumes, may be able to advance to clears tomorrow, will confer with GI - GI consulted, will see in AM

## 2023-11-21 NOTE — ED Provider Notes (Signed)
 [facility] Provider Note    Event Date/Time   First MD Initiated Contact with Patient 11/21/23 1720     (approximate)   History   Abdominal Pain   HPI Adam Rios. is a 83 y.o. male with history of HTN, HLD, neurogenic bladder, recurrent UTIs, HFpEF, A-fib presenting today for abdominal pain.  Patient presents from living facility for worsening abdominal distention and pain.  He reports he has had abdominal pain for several weeks but is worsened in the past couple of days.  He has intermittent nausea but no vomiting.  Notes distention is also getting worse.  He does not believe he has had a bowel movement in about 2 weeks.  His facility reported he had imaging that questionably showed a volvulus.  He otherwise denies cough, congestion, shortness of breath, chest pain.     Physical Exam   Triage Vital Signs: ED Triage Vitals  Encounter Vitals Group     BP      Girls Systolic BP Percentile      Girls Diastolic BP Percentile      Boys Systolic BP Percentile      Boys Diastolic BP Percentile      Pulse      Resp      Temp      Temp src      SpO2      Weight      Height      Head Circumference      Peak Flow      Pain Score      Pain Loc      Pain Education      Exclude from Growth Chart     Most recent vital signs: Vitals:   11/21/23 1732  BP: (!) 130/91  Pulse: 98  Resp: 20  Temp: 97.8 F (36.6 C)  SpO2: 94%    Physical Exam: I have reviewed the vital signs and nursing notes. General: Awake, alert, no acute distress.  Nontoxic appearing. Head:  Atraumatic, normocephalic.   ENT:  EOM intact, PERRL. Oral mucosa is pink and moist with no lesions. Neck: Neck is supple with full range of motion, No meningeal signs. Cardiovascular:  RRR, No murmurs. Peripheral pulses palpable and equal bilaterally. Respiratory:  Symmetrical chest wall expansion.  No rhonchi, rales, or wheezes.  Good air movement throughout.  No use of accessory  muscles.   Musculoskeletal:  No cyanosis or edema. Moving extremities with full ROM Abdomen: Abdominal distention which is slightly hard and tender throughout Neuro:  GCS 15, moving all four extremities, interacting appropriately. Speech clear. Psych:  Calm, appropriate.   Skin:  Warm, dry, no rash.    ED Results / Procedures / Treatments   Labs (all labs ordered are listed, but only abnormal results are displayed) Labs Reviewed  COMPREHENSIVE METABOLIC PANEL WITH GFR - Abnormal; Notable for the following components:      Result Value   Potassium 2.7 (*)    CO2 21 (*)    Glucose, Bld 102 (*)    Calcium  8.3 (*)    Total Protein 6.4 (*)    Albumin 2.8 (*)    AST 13 (*)    All other components within normal limits  CBC WITH DIFFERENTIAL/PLATELET - Abnormal; Notable for the following components:   Hemoglobin 11.7 (*)    HCT 37.1 (*)    MCH 25.6 (*)    RDW 16.4 (*)    All other components within  normal limits  LIPASE, BLOOD     EKG My EKG interpretation: Rate of 101, sinus tachycardia.  Normal axis, normal intervals.  No acute ST elevations or pression's   RADIOLOGY Independently interpreted x-ray with significant gaseous distention throughout the colon suspecting ileus and most consistent with Ogilvie syndrome without obvious obstruction or volvulus   PROCEDURES:  Critical Care performed: No  Procedures   MEDICATIONS ORDERED IN ED: Medications  potassium chloride  10 mEq in 100 mL IVPB (10 mEq Intravenous New Bag/Given 11/21/23 1835)  ondansetron  (ZOFRAN ) injection 4 mg (4 mg Intravenous Given 11/21/23 1755)  morphine  (PF) 4 MG/ML injection 4 mg (4 mg Intravenous Given 11/21/23 1755)  iohexol  (OMNIPAQUE ) 300 MG/ML solution 100 mL (100 mLs Intravenous Contrast Given 11/21/23 1820)     IMPRESSION / MDM / ASSESSMENT AND PLAN / ED COURSE  I reviewed the triage vital signs and the nursing notes.                              Differential diagnosis includes, but is not  limited to, volvulus, intussusception, SBO, appendicitis, diverticulitis  Patient's presentation is most consistent with acute complicated illness / injury requiring diagnostic workup.  Patient is an 83 year old male presenting today for abdominal pain and distention worsening over the past several days.  Reportedly had an x-ray which showed a volvulus several days ago with worsening symptoms.  Will give Zofran  and morphine  for pain symptoms at this time.  Laboratory workup and CT abdomen/pelvis ordered for further evaluation.  Laboratory workup with mild hypokalemia for which she was given electrolyte repletion.  Otherwise rest of CMP, lipase, CBC largely unremarkable.  CT abdomen/pelvis shows significant colonic distention most indicative of ileus.  No evidence of obstruction or volvulus.  Given significant distention, did discuss the case with general surgery, Dr. 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  Recommends keeping him n.p.o. overnight for repeat x-ray in the morning and if not resolving, may need GI consultation for colonoscopy.  No other medications or NG tube recommended at this time.  Patient mated to hospitalist for further care.  The patient is on the cardiac monitor to evaluate for evidence of arrhythmia and/or significant heart rate changes. Clinical Course as of 11/21/23 [name] Nov 21, 2023  1852 General surgery - Ogilvie's syndrome. Keep NPO. Get XR in the morning. [DW]  1856 Attempted to call daughter with no answer [DW]  1907 Surgery looked at imaging again and recommends talking with GI although no likely immediate intervention at this time. [DW]    Clinical Course User Index [DW] Malvina Alm DASEN, MD     FINAL CLINICAL IMPRESSION(S) / ED DIAGNOSES   Final diagnoses:  Ogilvie's syndrome  Abdominal distension  Hypokalemia     Rx / DC Orders   ED Discharge Orders     None        Note:  This document was prepared using  Dragon voice recognition software and may include unintentional dictation errors.   Malvina Alm DASEN, MD 11/21/23 1900

## 2023-11-21 NOTE — Assessment & Plan Note (Signed)
 Hemoglobin stable relative to baseline, no bleeding reported

## 2023-11-22 ENCOUNTER — Inpatient Hospital Stay

## 2023-11-22 DIAGNOSIS — K5981 Ogilvie syndrome: Principal | ICD-10-CM

## 2023-11-22 LAB — BASIC METABOLIC PANEL WITH GFR
Anion gap: 8 (ref 5–15)
BUN: 14 mg/dL (ref 8–23)
CO2: 23 mmol/L (ref 22–32)
Calcium: 8.4 mg/dL — ABNORMAL LOW (ref 8.9–10.3)
Chloride: 106 mmol/L (ref 98–111)
Creatinine, Ser: 0.75 mg/dL (ref 0.61–1.24)
GFR, Estimated: >60 mL/min (ref >60)
Glucose, Bld: 119 mg/dL — ABNORMAL HIGH (ref 70–99)
Potassium: 3.1 mmol/L — ABNORMAL LOW (ref 3.5–5.1)
Sodium: 137 mmol/L (ref 135–145)

## 2023-11-22 LAB — CBC
HCT: 40.1 % (ref 39.0–52.0)
Hemoglobin: 12.2 g/dL — ABNORMAL LOW (ref 13.0–17.0)
MCH: 25.1 pg — ABNORMAL LOW (ref 26.0–34.0)
MCHC: 30.4 g/dL (ref 30.0–36.0)
MCV: 82.5 fL (ref 80.0–100.0)
Platelets: 275 10*3/uL (ref 150–400)
RBC: 4.86 MIL/uL (ref 4.22–5.81)
RDW: 16.3 % — ABNORMAL HIGH (ref 11.5–15.5)
WBC: 14.6 10*3/uL — ABNORMAL HIGH (ref 4.0–10.5)
nRBC: 0 % (ref 0.0–0.2)

## 2023-11-22 LAB — MAGNESIUM: Magnesium: 2 mg/dL (ref 1.7–2.4)

## 2023-11-22 MED ORDER — CHLORHEXIDINE GLUCONATE CLOTH 2 % EX PADS
6.0000 | MEDICATED_PAD | Freq: Every day | CUTANEOUS | Status: DC
  Administered 2023-11-22 – 2023-11-30 (×9): 6 via TOPICAL

## 2023-11-22 MED ORDER — POTASSIUM CHLORIDE CRYS ER 20 MEQ PO TBCR
40.0000 meq | EXTENDED_RELEASE_TABLET | Freq: Once | ORAL | Status: AC
  Administered 2023-11-22: 40 meq via ORAL
  Filled 2023-11-22: qty 2

## 2023-11-22 MED ORDER — POTASSIUM CHLORIDE 10 MEQ/100ML IV SOLN
10.0000 meq | INTRAVENOUS | Status: AC
  Administered 2023-11-22 (×4): 10 meq via INTRAVENOUS
  Filled 2023-11-22 (×3): qty 100

## 2023-11-22 NOTE — [place] FL2 (Signed)
 [place]  MEDICAID FL2 LEVEL OF CARE FORM     IDENTIFICATION  Patient Name: Adam Rios. Birthdate: Sep 27, 1940 Sex: male Admission Date (Current Location): 11/21/2023  [place] and [place] Number:  Chiropodist and Address:  [facility], [address] [place], [place] [address]      Provider Number: [id number]  Attending Physician Name and Address:  Elpidio Reyes DEL, MD  Relative Name and Phone Number:  Daughter: Jeremaine Maraj: [phone]    Current Level of Care: SNF Recommended Level of Care: Nursing Facility, Skilled Nursing Facility Prior Approval Number:    Date Approved/Denied:   PASRR Number: [id number]  Discharge Plan: SNF    Current Diagnoses: Patient Active Problem List   Diagnosis Date Noted   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   Dementia without behavioral disturbance (HCC) 11/21/2023   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 11/21/2023   Acute pseudo-obstruction of colon 09/01/2023   Proctocolitis 08/31/2023   Suprapubic catheter dysfunction, initial encounter (HCC) 06/14/2023   Depression 06/14/2023   Acute postoperative anemia due to expected blood loss 11/29/2022   Overweight (BMI 25.0-29.9) 11/28/2022   Hyponatremia 11/28/2022   Metabolic acidosis 11/28/2022   Closed left hip fracture (HCC) 11/27/2022   Known medical problems 11/27/2022   Acute metabolic encephalopathy 09/29/2022   Atrial fibrillation with RVR (HCC) 09/28/2022   PAD (peripheral artery disease) (HCC) 09/28/2022   History of stroke 09/28/2022   Sepsis secondary to UTI (HCC) 09/28/2022   Generalized weakness 08/23/2022   Hypokalemia 08/14/2022   AKI (acute kidney injury) (HCC) 08/09/2022   Leukocytosis 08/09/2022   Severe sepsis (HCC) 08/09/2022   Heart failure with preserved ejection fraction (HCC) 08/09/2022   Acute hemorrhagic cystitis 06/11/2022   Iron  deficiency anemia due to chronic blood loss 06/11/2022   Dyslipidemia  06/11/2022   GERD without esophagitis 06/11/2022   Lower extremity cellulitis 06/11/2022   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   Second degree heart block 05/02/2022   Acute on chronic congestive heart failure (HCC) 04/30/2022   Chronic diastolic CHF (congestive heart failure) (HCC) 04/30/2022   Obesity (BMI 30-39.9) 04/30/2022   Chronic indwelling Foley catheter 04/30/2022   Septic shock (HCC) 02/11/2022   Pressure injury of skin 02/11/2022   Bacteremia 11/22/2021   Hematuria 11/21/2021   Renal cell carcinoma s/p nephrectomy, left 11/21/2021   History of recurrent UTI and ESBL UTI  11/21/2021   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   UTI (urinary tract infection) 09/09/2021   Neuropathy 09/09/2021   History of CVA (cerebrovascular accident) 09/09/2021   Fall 05/14/2021   Frailty 03/04/2018   Acute encephalopathy 01/30/2018   GERD (gastroesophageal reflux disease) 04/05/2017   Pain 04/05/2017   Weakness 04/05/2017   Recurrent UTI 01/20/2016   Essential hypertension 07/31/2014   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 04/10/2014   Ventral hernia without obstruction or gangrene 04/10/2014   Acute low back pain without sciatica 12/26/2013   Fusion of spine of lumbar region 12/26/2013   Anasarca 09/26/2013   Hemorrhoid 09/26/2013   Neurogenic bladder 09/26/2013   Renal cell cancer (HCC) 09/26/2013   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   Lower urinary tract infectious disease 10/28/2012    Orientation RESPIRATION BLADDER Height & Weight     Self  Normal Incontinent Weight:   Height:     BEHAVIORAL SYMPTOMS/MOOD NEUROLOGICAL BOWEL NUTRITION STATUS      Incontinent Diet  AMBULATORY STATUS COMMUNICATION OF NEEDS Skin   Extensive Assist  Verbally Normal                       Personal Care Assistance Level of Assistance  Bathing, Feeding, Dressing Bathing Assistance: Maximum assistance Feeding assistance: Limited assistance Dressing Assistance: Maximum  assistance     Functional Limitations Info             SPECIAL CARE FACTORS FREQUENCY                       Contractures Contractures Info: Not present    Additional Factors Info  Code Status Code Status Info: DNR-Limited             Current Medications (11/22/2023):  This is the current hospital active medication list Current Facility-Administered Medications  Medication Dose Route Frequency Provider Last Rate Last Admin   acetaminophen  (TYLENOL ) tablet 650 mg  650 mg Oral Q6H PRN Danford, Lonni SQUIBB, MD       Or   acetaminophen  (TYLENOL ) suppository 650 mg  650 mg Rectal Q6H PRN Danford, Lonni SQUIBB, MD       Chlorhexidine  Gluconate Cloth 2 % PADS 6 each  6 each Topical Daily Danford, Lonni SQUIBB, MD   6 each at 11/22/23 1305   dextrose  5 % and 0.9 % NaCl with KCl 40 mEq/L infusion   Intravenous Continuous Jonel Lonni SQUIBB, MD 125 mL/hr at 11/22/23 0858 New Bag at 11/22/23 0858   enoxaparin  (LOVENOX ) injection 40 mg  40 mg Subcutaneous Q24H Danford, Lonni SQUIBB, MD   40 mg at 11/22/23 0900   fesoterodine  (TOVIAZ ) tablet 4 mg  4 mg Oral Daily Jonel Lonni SQUIBB, MD   4 mg at 11/21/23 2143   finasteride  (PROSCAR ) tablet 5 mg  5 mg Oral Daily Danford, Lonni SQUIBB, MD   5 mg at 11/22/23 0901   ondansetron  (ZOFRAN ) tablet 4 mg  4 mg Oral Q6H PRN Danford, Lonni SQUIBB, MD       Or   ondansetron  (ZOFRAN ) injection 4 mg  4 mg Intravenous Q6H PRN Danford, Lonni SQUIBB, MD         Discharge Medications: Please see discharge summary for a list of discharge medications.  Relevant Imaging Results:  Relevant Lab Results:   Additional Information SS #: [id number]  Quintella Suzen Jansky, RN

## 2023-11-22 NOTE — Consult Note (Signed)
 Rogelia Copping, MD [facility]  [address] [place], [place] [address] Phone: [phone] Fax : [phone]  Consultation  Referring Provider:     Dr. Malvina Primary Care Physician:  [facility] Primary Gastroenterologist: Sam[place]         Reason for Consultation:     Colonic pseudoobstruction  Date of Admission:  11/21/2023 Date of Consultation:  11/22/2023         HPI:   Adam Rosengren. is a 83 y.o. male who is being seen by me back in April of this year for sigmoid distention.  The patient had reported a month of abdominal pain.  At that time the patient had a CT scan showing gaseous distention of the redundant sigmoid colon measuring up to 7.8 cm.  The patient was admitted with a history of hypertension a CVA CHF neurogenic bladder with a catheter in place and abdominal pain with distention.  The patient had a repeat CT scan of the abdomen yesterday that showed:  IMPRESSION: 1. The colon is gas distended throughout the entirety from the cecum to the rectum. The sigmoid colon is redundant and tortuous but no evidence of twisting to suggest volvulus. 2. No evidence of small-bowel obstruction. 3. Findings most suggestive of colonic ileus. 4. Suprapubic catheter in decompressed bladder.  A repeat KUB today did not show any significant improvement.  Due to the colonic distention a surgical consult was ordered and they recommended GI involvement.  The patient had a consult called to Dr. Wells yesterday with the recommendations of keeping the potassium over 4 with a magnesium  over 2 and adequate hydration.  There is also recommended to avoid narcotics and to do serial abdominal exams  The patient is not able to give much history when I speak to him today.  The patient sigmoidoscopy in April showed a poor prep and the scope was advanced to the descending colon without any obstruction seen.  Past Medical History:  Diagnosis Date   Cognitive impairment     Complication of anesthesia    Fentanyl  causes nausea   Hemorrhagic cerebrovascular accident (CVA) (HCC)    Hypertension    Neurogenic bladder    Renal cell carcinoma (HCC) 2005   Renal disorder    Stroke (HCC)     01/17/18, 12/21   Some weakness and cognitive decline    Past Surgical History:  Procedure Laterality Date   APPENDECTOMY     CATARACT EXTRACTION W/PHACO Right 07/22/2020   Procedure: CATARACT EXTRACTION PHACO AND INTRAOCULAR LENS PLACEMENT (IOC) RIGHT;  Surgeon: Myrna Adine Anes, MD;  Location: [facility];  Service: Ophthalmology;  Laterality: Right;  4.77 0:44.2   CATARACT EXTRACTION W/PHACO Left 08/05/2020   Procedure: CATARACT EXTRACTION PHACO AND INTRAOCULAR LENS PLACEMENT (IOC) LEFT 10.73 01:13.8;  Surgeon: Myrna Adine Anes, MD;  Location: [facility];  Service: Ophthalmology;  Laterality: Left;  Use eye stretcher not chair   FLEXIBLE SIGMOIDOSCOPY N/A 09/02/2023   Procedure: KINGSTON SIDE;  Surgeon: Copping Rogelia, MD;  Location: [facility];  Service: Endoscopy;  Laterality: N/A;   HERNIA REPAIR     HIP ARTHROPLASTY Left 11/28/2022   Procedure: ARTHROPLASTY BIPOLAR HIP (HEMIARTHROPLASTY);  Surgeon: Tobie Priest, MD;  Location: [facility] ORS;  Service: Orthopedics;  Laterality: Left;   IR CATHETER TUBE CHANGE  06/15/2023   LUMBAR FUSION     NEPHRECTOMY Left 2005   TEMPORARY PACEMAKER N/A 05/03/2022   Procedure: TEMPORARY PACEMAKER;  Surgeon: Florencio Cara BIRCH, MD;  Location: [facility] INVASIVE CV LAB;  Service: Cardiovascular;  Laterality: N/A;    Prior to Admission medications   Medication Sig Start Date End Date Taking? Authorizing Provider  Acetaminophen  Extra Strength 500 MG TABS Take 1,000 mg by mouth 3 (three) times daily as needed (PAIN).   Yes [provider]  acetic acid 0.25 % irrigation Apply 1 Application topically as needed. 08/18/23  Yes [provider]  atorvastatin  (LIPITOR) 40 MG tablet Take 40 mg by mouth daily.    Yes [provider]  bisacodyl  (DULCOLAX) 10 MG suppository Place 10 mg rectally daily as needed. 11/19/23  Yes [provider]  CALPROTECT 0.44-20.6 % OINT Apply 1 Application topically 2 (two) times daily. 06/03/23  Yes [provider]  cyclobenzaprine  (FLEXERIL ) 5 MG tablet Take 5 mg by mouth 2 (two) times daily as needed. 11/19/23  Yes [provider]  docusate sodium  (COLACE) 100 MG capsule Take 1 capsule (100 mg total) by mouth 2 (two) times daily. 12/04/22  Yes Maree Hue, MD  finasteride  (PROSCAR ) 5 MG tablet Take 5 mg by mouth daily. 04/27/22  Yes [provider]  gabapentin  (NEURONTIN ) 300 MG capsule Take 2 capsules (600 mg total) by mouth 2 (two) times daily. 06/16/23  Yes Wieting, Richard, MD  hydrocortisone  cream ([employer] CORTISONE MAXIMUM STRENGTH) 1 % Apply 1 Application topically as needed for itching.   Yes [provider]  lactulose  (CHRONULAC ) 10 GM/15ML solution Take 15 mLs (10 g total) by mouth 2 (two) times daily. Titrate to have at least one bowel movement daily 09/06/23  Yes Dezii, Alexandra, DO  metoprolol  succinate (TOPROL -XL) 25 MG 24 hr tablet Take 0.5 tablets (12.5 mg total) by mouth daily. 06/16/23  Yes Wieting, Richard, MD  MYRBETRIQ  25 MG TB24 tablet Take 25 mg by mouth daily. 06/02/23  Yes [provider]  pantoprazole  (PROTONIX ) 20 MG tablet Take 20 mg by mouth daily. 11/19/23  Yes [provider]  POLY-IRON  150 FORTE 150-25-1 MG-MCG-MG CAPS Take 1 capsule by mouth daily. 06/02/23  Yes [provider]  senna-docusate (SENOKOT-S) 8.6-50 MG tablet Take 1 tablet by mouth 2 (two) times daily as needed. 08/15/22  Yes Wieting, Richard, MD  sertraline  (ZOLOFT ) 50 MG tablet Take 50 mg by mouth daily. 06/02/23  Yes [provider]  solifenacin (VESICARE) 5 MG tablet Take 5 mg by mouth daily.   Yes [provider]  spironolactone  (ALDACTONE ) 25 MG tablet Take 25 mg by mouth daily. 11/19/23  Yes  [provider]  tamsulosin  (FLOMAX ) 0.4 MG CAPS capsule Take 0.4 mg by mouth daily. 10/30/22  Yes [provider]  vitamin B-12 (CYANOCOBALAMIN ) 1000 MCG tablet Take 1,000 mcg by mouth daily.   Yes [provider]  Vitamin D , Ergocalciferol , (DRISDOL ) 1.25 MG (50000 UNIT) CAPS capsule Take 50,000 Units by mouth every 7 (seven) days. 06/02/23  Yes [provider]  Zinc Oxide 13 % CREA Apply topically.   Yes [provider]  metoCLOPramide  (REGLAN ) 5 MG tablet Take 1-2 tablets (5-10 mg total) by mouth every 8 (eight) hours as needed for nausea (if ondansetron  (ZOFRAN ) ineffective.). Patient not taking: Reported on 11/21/2023 12/04/22   Maree Hue, MD  ondansetron  (ZOFRAN ) 4 MG tablet Take 1 tablet (4 mg total) by mouth every 6 (six) hours as needed for nausea. 12/04/22   Maree Hue, MD  torsemide  40 MG TABS Take 40 mg by mouth daily. 12/05/22   Maree Hue, MD    Family History  Problem  Relation Age of Onset   Cerebral aneurysm Father    Cerebral aneurysm Sister      Social History   Tobacco Use   Smoking status: Former    Current packs/day: 0.00    Average packs/day: 0.3 packs/day for 10.0 years (2.5 ttl pk-yrs)    Types: Cigarettes    Start date: 73    Quit date: 2005    Years since quitting: 20.4   Smokeless tobacco: Never  Vaping Use   Vaping status: Never Used  Substance Use Topics   Alcohol  use: Not Currently   Drug use: Never    Allergies as of 11/21/2023 - Review Complete 11/21/2023  Allergen Reaction Noted   Empagliflozin  07/07/2022   Fentanyl  Nausea Only 07/29/2016   Oxycodone  Nausea And Vomiting 02/24/2020    Review of Systems:    All systems reviewed and negative except where noted in HPI.   Physical Exam:  Vital signs in last 24 hours: Temp:  [97.6 F (36.4 C)-98.5 F (36.9 C)] 97.6 F (36.4 C) (06/23 0726) Pulse Rate:  [70-105] 100 (06/23 0726) Resp:  [12-20] 18 (06/23 0726) BP: (110-130)/(72-91) 110/72 (06/23  0726) SpO2:  [94 %-100 %] 100 % (06/23 0726) Last BM Date : 11/21/23 General:   Pleasant, cooperative in NAD Head:  Normocephalic and atraumatic. Eyes:   No icterus.   Conjunctiva pink. PERRLA. Ears:  Normal auditory acuity. Neck:  Supple; no masses or thyroidomegaly Lungs: Respirations even and unlabored. Lungs clear to auscultation bilaterally.   No wheezes, crackles, or rhonchi.  Heart:  Regular rate and rhythm;  Without murmur, clicks, rubs or gallops Abdomen:  Soft, nondistended, nontender. Normal bowel sounds. No appreciable masses or hepatomegaly.  No rebound or guarding.  Rectal:  Not performed. Msk:  Symmetrical without gross deformities.    Extremities:  Without edema, cyanosis or clubbing. Neurologic:  Alert and oriented x3;  grossly normal neurologically. Skin:  Intact without significant lesions or rashes. Cervical Nodes:  No significant cervical adenopathy. Psych:  Alert and cooperative. Normal affect.  LAB RESULTS: Recent Labs    11/21/23 1730 11/22/23 0131  WBC 9.9 14.6*  HGB 11.7* 12.2*  HCT 37.1* 40.1  PLT 268 275   BMET Recent Labs    11/21/23 1730 11/22/23 0131  NA 136 137  K 2.7* 3.1*  CL 106 106  CO2 21* 23  GLUCOSE 102* 119*  BUN 14 14  CREATININE 0.70 0.75  CALCIUM  8.3* 8.4*   LFT Recent Labs    11/21/23 1730  PROT 6.4*  ALBUMIN 2.8*  AST 13*  ALT 11  ALKPHOS 122  BILITOT 0.8   PT/INR No results for input(s): LABPROT, INR in the last 72 hours.  STUDIES: DG Abd Portable 1V Result Date: 11/22/2023 EXAM: 1 VIEW XRAY OF THE ABDOMEN SUPINE 11/22/2023 06:26:39 AM COMPARISON: CT of the abdomen and pelvis with contrast 11/21/2023. CLINICAL HISTORY: Acute pseudo-obstruction of colon FINDINGS: BOWEL: Diffuse colonic distention remains. No free air is present. PERITONEUM AND SOFT TISSUES: No abnormal calcifications. BONES: Left hip hemiarthroplasty is noted. Atherosclerotic changes are present. No acute osseous abnormality. IMPRESSION: 1.  Diffuse colonic distention remains. 2. No free air. Electronically signed by: Lonni Necessary MD 11/22/2023 06:49 AM EDT RP Workstation: [id number]   CT ABDOMEN PELVIS W CONTRAST Result Date: 11/21/2023 CLINICAL DATA:  Abdominal distension. Abdominal pain. No bowel movement several weeks. EXAM: CT ABDOMEN AND PELVIS WITH CONTRAST TECHNIQUE: Multidetector CT imaging of the abdomen and pelvis was performed using the standard protocol  following bolus administration of intravenous contrast. RADIATION DOSE REDUCTION: This exam was performed according to the departmental dose-optimization program which includes automated exposure control, adjustment of the mA and/or kV according to patient size and/or use of iterative reconstruction technique. CONTRAST:  OMNIPAQUE  IOHEXOL  300 MG/ML  SOLN COMPARISON:  CT 08/31/2023 insert FINDINGS: Lower chest: Lung bases are clear. Hepatobiliary: No focal hepatic lesion. Normal gallbladder. No biliary duct dilatation. Common bile duct is normal. Pancreas: Pancreas is normal. No ductal dilatation. No pancreatic inflammation. Spleen: Normal spleen Adrenals/urinary tract: Adrenal glands are normal. RIGHT kidney is normal. LEFT kidney is absent. Suprapubic catheter within the bladder. The bladder is decompressed. Stomach/Bowel: Stomach is normal. Duodenum and small-bowel appear normal. Small bowel is decompressed. No small bowel dilatation evident. The terminal ileum is normal. Post appendectomy. There is gas within the cecum and transverse colon. The descending colon is stool filled. The sigmoid colon is redundant and gas filled. There is no evidence of twisting of the sigmoid colon to suggest volvulus pathophysiology there is frothy stool within the rectum. No intraperitoneal free air.  Pneumatosis intestinalis. Vascular/Lymphatic: Abdominal aorta is normal caliber. No periportal or retroperitoneal adenopathy. No pelvic adenopathy. Reproductive: Unremarkable Other: No free air  or intraperitoneal free fluid Musculoskeletal: No aggressive osseous lesion. IMPRESSION: 1. The colon is gas distended throughout the entirety from the cecum to the rectum. The sigmoid colon is redundant and tortuous but no evidence of twisting to suggest volvulus. 2. No evidence of small-bowel obstruction. 3. Findings most suggestive of colonic ileus. 4. Suprapubic catheter in decompressed bladder. Electronically Signed   By: Jackquline Boxer M.D.   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 is a 83 y.o. y/o male with colonic pseudoobstruction and a history of neurogenic bladder with dementia who is now admitted with worsening colonic distention and abdominal discomfort.  Plan:  I suggest to get the electrolytes back to normal. Keep the potassium over 4.0 magnesium  over 2.0 adequate hydration. Avoid narcotics serial abdominal exams if there is worsening abdominal symptoms that get repeat x-ray.  In addition I have ordered the patient have a rectal tube to help with decompression.  Although a decompression colonoscopy can be attempted it is likely to recur since the patient has been having dilation of his colon since April.  The patient has been explained the plan about having rectal tube placed and he agrees with it.  Thank you for involving me in the care of this patient.      LOS: 1 day   Rogelia Copping, MD, MD. [place] 11/22/2023, 2:53 PM,  Pager [phone] 7am-5pm  Check AMION for 5pm -7am coverage and on  weekends   Note: This dictation was prepared with Dragon dictation along with smaller phrase technology. Any transcriptional errors that result from this process are unintentional.

## 2023-11-22 NOTE — Plan of Care (Signed)

## 2023-11-22 NOTE — Progress Notes (Signed)
 PROGRESS NOTE  Adam Rios.  FMW:[id number] DOB: 19-Jun-1940 DOA: 11/21/2023 PCP: [facility]  Brief Narrative: 82 y.o. 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 History per EMS/EDP -- evidently developed constipation again several weeks ago.  Then in the last few days, his belly became distended, painful.  An x-ray at his facility two days ago showed volvulus reportedly.  He was having jelly like stools.  So he was sent to the ER.   In the ER, K 2.7 other electrolyes and renal function normal.     CT showed ileus.  EDP spoke with Dr. Marinda General Surgery and Dr. Therisa GI.  The consensus is that it may be ileus or (given degree of distension on CT) mild colonic pseudoobstruction, will attempt to manage conservatively.     Assessment & Plan: Acute pseudo-obstruction of colon CT shows colon dilated to 8-10cm, no clear obstruction, scant stool.  Suspect from hypokalemia, Myrbetric in setting of neurogenic bladder/colon.  No evidence of infection. 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 - EKG good here, read as possible dextrocardia, not confirmed on CXR   Dementia without behavioral disturbance (HCC) - Hold sertraline    Iron  deficiency anemia due to chronic blood loss Hemoglobin stable relative to baseline, no bleeding reported   Chronic indwelling Foley catheter  - secondary to chronic neurogenic bladder  Leukocytosis:  - likely secondary to #1 above, reactive -  will trend   Chronic diastolic CHF (congestive heart failure) (HCC) Appears dehydrated - Hold spironolactone , torsemide , metoprolol    History of CVA (cerebrovascular accident) - Hold Lipitor for now   Neurogenic bladder Indwelling SP catheter in place.  No fever or leukocytosis - Continue Vesicare, finasteride  - Hold Flomax , Myrbetriq  for now in light of #1 above   Essential hypertension Blood pressure normal - Hold torsemide , spironolactone , metoprolol  for now     DVT prophylaxis:  enoxaparin  (LOVENOX ) injection 40 mg Start: 11/22/23 0900  Code Status:   Code Status: Limited: Do not attempt resuscitation (DNR) -DNR-LIMITED -Do Not Intubate/DNI  Level of care: Telemetry Medical Status is: Inpatient Dispo:  pending resolution of pseudo-obstruction   Consults called: GI, Surgery   Subjective: Patient sleeping but easily arousable.  Complains of abd pain, but reports better than we he came in.  No BM  Objective: Vitals:   11/21/23 1919 11/21/23 2015 11/22/23 0444 11/22/23 0726  BP: 125/82 111/73 118/78 110/72  Pulse: 73 70 (!) 105 100  Resp: 12 17  18   Temp: 98 F (36.7 C) 98.3 F (36.8 C) 98.5 F (36.9 C) 97.6 F (36.4 C)  TempSrc: Oral   Oral  SpO2: 97% 98% 100% 100%    Intake/Output Summary (Last 24 hours) at 11/22/2023 1359 Last data filed at 11/22/2023 9096 Gross per 24 hour  Intake 150.2 ml  Output 250 ml  Net -99.8 ml   There were no vitals filed for this visit. There is no height or weight on file to calculate BMI.  Gen: 83 y.o. male in no apparent distress.  Nontoxic Pulm: Non-labored breathing.  Clear to auscultation bilaterally.  CV: Regular rate and rhythm. No murmur, rub, or gallop. No JVD GI: Abdomen soft, mild distension, pain BL lower quadrants Ext: Warm, no deformities Skin: No rashes, lesions  Neuro: Alert and oriented to person and hospital.  Moving all limbs symmetrically.  Psych: Calm  Judgement and insight appear normal. Mood & affect  appropriate.     I have personally reviewed the following labs and images: CBC: Recent Labs  Lab 11/21/23 1730 11/22/23 0131  WBC 9.9 14.6*  NEUTROABS 6.8  --   HGB 11.7* 12.2*  HCT 37.1* 40.1  MCV 81.2 82.5  PLT 268 275   BMP &GFR Recent Labs  Lab 11/21/23 1730 11/22/23 0131  NA 136 137  K 2.7* 3.1*  CL 106 106  CO2 21* 23  GLUCOSE 102* 119*  BUN 14 14  CREATININE 0.70 0.75  CALCIUM  8.3* 8.4*  MG 2.1 2.0   CrCl cannot be calculated (Unknown ideal weight.). Liver & Pancreas: Recent Labs  Lab 11/21/23 1730  AST 13*  ALT 11  ALKPHOS 122  BILITOT 0.8  PROT 6.4*  ALBUMIN 2.8*   Recent Labs  Lab 11/21/23 1730  LIPASE 22   No results for input(s): AMMONIA in the last 168 hours. Diabetic: No results for input(s): HGBA1C in the last 72 hours. No results for input(s): GLUCAP in the last 168 hours. Cardiac Enzymes: No results for input(s): CKTOTAL, CKMB, CKMBINDEX, TROPONINI in the last 168 hours. No results for input(s): PROBNP in the last 8760 hours. Coagulation Profile: No results for input(s): INR, PROTIME in the last 168 hours. Thyroid  Function Tests: No results for input(s): TSH, T4TOTAL, FREET4, T3FREE, THYROIDAB in the last 72 hours. Lipid Profile: No results for input(s): CHOL, HDL, LDLCALC, TRIG, CHOLHDL, LDLDIRECT in the last 72 hours. Anemia Panel: No results for input(s): VITAMINB12, FOLATE, FERRITIN, TIBC, IRON , RETICCTPCT in the last 72 hours. Urine analysis:    Component Value Date/Time   COLORURINE YELLOW (A) 09/15/2023 1821   APPEARANCEUR CLOUDY (A) 09/15/2023 1821   LABSPEC 1.026 09/15/2023 1821   PHURINE 5.0 09/15/2023 1821   GLUCOSEU NEGATIVE 09/15/2023 1821   HGBUR SMALL (A) 09/15/2023 1821   BILIRUBINUR NEGATIVE 09/15/2023 1821   KETONESUR NEGATIVE 09/15/2023 1821   PROTEINUR 100 (A) 09/15/2023 1821   NITRITE POSITIVE (A) 09/15/2023 1821   LEUKOCYTESUR LARGE (A) 09/15/2023  1821   Sepsis Labs: Invalid input(s): PROCALCITONIN, LACTICIDVEN  Microbiology: No results found for this or any previous visit (from the past 240 hours).  Radiology Studies: DG Abd Portable 1V Result Date: 11/22/2023 EXAM: 1 VIEW XRAY OF THE ABDOMEN SUPINE 11/22/2023 06:26:39 AM COMPARISON: CT of the abdomen and pelvis with contrast 11/21/2023. CLINICAL HISTORY: Acute pseudo-obstruction of colon FINDINGS: BOWEL: Diffuse colonic distention remains. No free air is present. PERITONEUM AND SOFT TISSUES: No abnormal calcifications. BONES: Left hip hemiarthroplasty is noted. Atherosclerotic changes are present. No acute osseous abnormality. IMPRESSION: 1. Diffuse colonic distention remains. 2. No free air. Electronically signed by: Lonni Necessary MD 11/22/2023 06:49 AM EDT RP Workstation: [id number]   CT ABDOMEN PELVIS W CONTRAST Result Date: 11/21/2023 CLINICAL DATA:  Abdominal distension. Abdominal pain. No bowel movement several weeks. EXAM: CT ABDOMEN AND PELVIS WITH CONTRAST TECHNIQUE: Multidetector CT imaging of the abdomen and pelvis was performed using the standard protocol following bolus administration of intravenous contrast. RADIATION DOSE REDUCTION: This exam was performed according to the departmental dose-optimization  program which includes automated exposure control, adjustment of the mA and/or kV according to patient size and/or use of iterative reconstruction technique. CONTRAST:  OMNIPAQUE  IOHEXOL  300 MG/ML  SOLN COMPARISON:  CT 08/31/2023 insert FINDINGS: Lower chest: Lung bases are clear. Hepatobiliary: No focal hepatic lesion. Normal gallbladder. No biliary duct dilatation. Common bile duct is normal. Pancreas: Pancreas is normal. No ductal dilatation. No pancreatic inflammation. Spleen: Normal spleen Adrenals/urinary tract: Adrenal glands are normal. RIGHT kidney is normal. LEFT kidney is absent. Suprapubic catheter within the bladder. The bladder is decompressed.  Stomach/Bowel: Stomach is normal. Duodenum and small-bowel appear normal. Small bowel is decompressed. No small bowel dilatation evident. The terminal ileum is normal. Post appendectomy. There is gas within the cecum and transverse colon. The descending colon is stool filled. The sigmoid colon is redundant and gas filled. There is no evidence of twisting of the sigmoid colon to suggest volvulus pathophysiology there is frothy stool within the rectum. No intraperitoneal free air.  Pneumatosis intestinalis. Vascular/Lymphatic: Abdominal aorta is normal caliber. No periportal or retroperitoneal adenopathy. No pelvic adenopathy. Reproductive: Unremarkable Other: No free air or intraperitoneal free fluid Musculoskeletal: No aggressive osseous lesion. IMPRESSION: 1. The colon is gas distended throughout the entirety from the cecum to the rectum. The sigmoid colon is redundant and tortuous but no evidence of twisting to suggest volvulus. 2. No evidence of small-bowel obstruction. 3. Findings most suggestive of colonic ileus. 4. Suprapubic catheter in decompressed bladder. Electronically Signed   By: Jackquline Boxer M.D.   On: 11/21/2023 18:44    Scheduled Meds:  Chlorhexidine  Gluconate Cloth  6 each Topical Daily   enoxaparin  (LOVENOX ) injection  40 mg Subcutaneous Q24H   fesoterodine   4 mg Oral Daily   finasteride   5 mg Oral Daily   Continuous Infusions:  dextrose  5 % and 0.9 % NaCl with KCl 40 mEq/L 125 mL/hr at 11/22/23 0858     LOS: 1 day   35 minutes with more than 50% spent in reviewing records, counseling patient/family and coordinating care.  Adam VEAR Gaw, MD [facility] [email or web address] 11/22/2023, 1:59 PM

## 2023-11-22 NOTE — Progress Notes (Signed)
 Patient is alert and oriented x2 to person and place. Answers questions but reliability questionable due to inconsistent memory. Admission screening incomplete due to mental status. From [facility] forms in chart. He has a pacemaker that is V-paced and is a level 1-bed bound x2 assist. He is currently having clear mucous stools with dark green specks within it-sample collected and at bedside in case provider orders stool panel. Patient has redness to his groin and buttocks-barrier oint administered. Has area of excoriation to his sacrum-foam placed. Has chronic suprapubic catheter due to neurogenic bladder-catheter opening oozes small amount of purulent drainage-Foley care done x2. Patient's potassium has improved from 2.7 to 3.1 after multiple IV replacements. Magnesium  IV was discontinued last night. Patient denied pain and additional needs.

## 2023-11-22 NOTE — Consult Note (Signed)
 [facility] SURGICAL CONSULTATION NOTE (initial) - cpt: [phone]   HISTORY OF PRESENT ILLNESS (HPI):  History limited by dementia. History obtained primarily through chart review and discussion with members of the medical team.   83 y.o. male presented to [facility] ED yesterday for evaluation of abdominal pain. Patient presents from ALF [facility]) secondary to abdominal distension and constipation. Reportedly had an XR at the facility which showed volvulus. He had reportedly been having stools at the facility. Further pre-hospital history limited. He has a history of HTN, HLD, neurogenic bladder with indwelling suprapubic catheter, recurrent UTIs, HFpEF (Echo 2024 - EF 55-60%), A-fib not on anticoagulation. He did have a presentation in April of this year and underwent CT abdomen/pelvis at that time which had dilation of the proximal colon and there were concerns for proctocolitis. He did undergo flexible sigmoidoscopy at that time with Dr Jinny and there was no evidence of obstructive process. He does not appear to be on chronic narcotics. Per notes, he is bed bound. Work up in the ED revealed a normal WBC to 9.9 (Now 14.6K this AM), Hgb to 11.7 (now 12.2), sCr - 0.70, hypokalemia to 2.7 (now 3.1). He also underwent CT Abdomen/Pelvis concerning for colonic dilation and likely pseudo-obstruction. He was admitted to the medicine service.   Surgery is consulted by emergency medicine physician Dr. 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   PAST MEDICAL HISTORY (PMH):  Past Medical History:  Diagnosis Date   Cognitive impairment    Complication of anesthesia    Fentanyl  causes nausea   Hemorrhagic cerebrovascular accident (CVA) (HCC)    Hypertension    Neurogenic bladder    Renal cell carcinoma (HCC) 2005   Renal disorder    Stroke (HCC)     01/17/18, 12/21   Some weakness and cognitive decline     PAST SURGICAL HISTORY  (PSH):  Past Surgical History:  Procedure Laterality Date   APPENDECTOMY     CATARACT EXTRACTION W/PHACO Right 07/22/2020   Procedure: CATARACT EXTRACTION PHACO AND INTRAOCULAR LENS PLACEMENT (IOC) RIGHT;  Surgeon: Myrna Adine Anes, MD;  Location: [facility];  Service: Ophthalmology;  Laterality: Right;  4.77 0:44.2   CATARACT EXTRACTION W/PHACO Left 08/05/2020   Procedure: CATARACT EXTRACTION PHACO AND INTRAOCULAR LENS PLACEMENT (IOC) LEFT 10.73 01:13.8;  Surgeon: Myrna Adine Anes, MD;  Location: [facility];  Service: Ophthalmology;  Laterality: Left;  Use eye stretcher not chair   FLEXIBLE SIGMOIDOSCOPY N/A 09/02/2023   Procedure: KINGSTON SIDE;  Surgeon: Jinny Carmine, MD;  Location: [facility];  Service: Endoscopy;  Laterality: N/A;   HERNIA REPAIR     HIP ARTHROPLASTY Left 11/28/2022   Procedure: ARTHROPLASTY BIPOLAR HIP (HEMIARTHROPLASTY);  Surgeon: Tobie Priest, MD;  Location: [facility] ORS;  Service: Orthopedics;  Laterality: Left;   IR CATHETER TUBE CHANGE  06/15/2023   LUMBAR FUSION     NEPHRECTOMY Left 2005   TEMPORARY PACEMAKER N/A 05/03/2022   Procedure: TEMPORARY PACEMAKER;  Surgeon: Florencio Cara BIRCH, MD;  Location: [facility] INVASIVE CV LAB;  Service: Cardiovascular;  Laterality: N/A;     MEDICATIONS:  Prior to Admission medications   Medication Sig Start Date End Date Taking? Authorizing Provider  Acetaminophen  Extra Strength 500 MG TABS Take 1,000 mg by mouth 3 (three) times daily as needed (PAIN).   Yes [provider]  acetic acid 0.25 % irrigation Apply 1 Application topically as needed. 08/18/23  Yes [provider]  atorvastatin  (  LIPITOR) 40 MG tablet Take 40 mg by mouth daily.   Yes [provider]  bisacodyl  (DULCOLAX) 10 MG suppository Place 10 mg rectally daily as needed. 11/19/23  Yes [provider]  CALPROTECT 0.44-20.6 % OINT Apply 1 Application topically 2 (two) times daily. 06/03/23  Yes [provider]  cyclobenzaprine  (FLEXERIL ) 5 MG tablet Take 5 mg by mouth 2 (two) times daily as needed. 11/19/23  Yes [provider]  docusate sodium  (COLACE) 100 MG capsule Take 1 capsule (100 mg total) by mouth 2 (two) times daily. 12/04/22  Yes Maree Hue, MD  finasteride  (PROSCAR ) 5 MG tablet Take 5 mg by mouth daily. 04/27/22  Yes [provider]  gabapentin  (NEURONTIN ) 300 MG capsule Take 2 capsules (600 mg total) by mouth 2 (two) times daily. 06/16/23  Yes Wieting, Richard, MD  hydrocortisone  cream ([employer] CORTISONE MAXIMUM STRENGTH) 1 % Apply 1 Application topically as needed for itching.   Yes [provider]  lactulose  (CHRONULAC ) 10 GM/15ML solution Take 15 mLs (10 g total) by mouth 2 (two) times daily. Titrate to have at least one bowel movement daily 09/06/23  Yes Dezii, Alexandra, DO  metoprolol  succinate (TOPROL -XL) 25 MG 24 hr tablet Take 0.5 tablets (12.5 mg total) by mouth daily. 06/16/23  Yes Wieting, Richard, MD  MYRBETRIQ  25 MG TB24 tablet Take 25 mg by mouth daily. 06/02/23  Yes [provider]  pantoprazole  (PROTONIX ) 20 MG tablet Take 20 mg by mouth daily. 11/19/23  Yes [provider]  POLY-IRON  150 FORTE 150-25-1 MG-MCG-MG CAPS Take 1 capsule by mouth daily. 06/02/23  Yes [provider]  senna-docusate (SENOKOT-S) 8.6-50 MG tablet Take 1 tablet by mouth 2 (two) times daily as needed. 08/15/22  Yes Wieting, Richard, MD  sertraline  (ZOLOFT ) 50 MG tablet Take 50 mg by mouth daily. 06/02/23  Yes [provider]  solifenacin (VESICARE) 5 MG tablet Take 5 mg by mouth daily.   Yes [provider]  spironolactone  (ALDACTONE ) 25 MG tablet Take 25 mg by mouth daily. 11/19/23  Yes [provider]  tamsulosin  (FLOMAX ) 0.4 MG CAPS capsule Take 0.4 mg by mouth daily. 10/30/22  Yes [provider]  vitamin B-12 (CYANOCOBALAMIN ) 1000 MCG tablet Take 1,000 mcg by mouth daily.   Yes [provider]  Vitamin D ,  Ergocalciferol , (DRISDOL ) 1.25 MG (50000 UNIT) CAPS capsule Take 50,000 Units by mouth every 7 (seven) days. 06/02/23  Yes [provider]  Zinc Oxide 13 % CREA Apply topically.   Yes [provider]  metoCLOPramide  (REGLAN ) 5 MG tablet Take 1-2 tablets (5-10 mg total) by mouth every 8 (eight) hours as needed for nausea (if ondansetron  (ZOFRAN ) ineffective.). Patient not taking: Reported on 11/21/2023 12/04/22   Maree Hue, MD  ondansetron  (ZOFRAN ) 4 MG tablet Take 1 tablet (4 mg total) by mouth every 6 (six) hours as needed for nausea. 12/04/22   Maree Hue, MD  torsemide  40 MG TABS Take 40 mg by mouth daily. 12/05/22   Maree Hue, MD     ALLERGIES:  Allergies  Allergen Reactions   Empagliflozin     Recurrent UTI with sepsis on jardiance.   Fentanyl  Nausea Only   Oxycodone  Nausea And Vomiting     SOCIAL HISTORY:  Social History   Socioeconomic History   Marital status: Divorced    Spouse name: Not on file   Number of children: Not on file   Years of education: Not on file   Highest education level:  Not on file  Occupational History   Not on file  Tobacco Use   Smoking status: Former    Current packs/day: 0.00    Average packs/day: 0.3 packs/day for 10.0 years (2.5 ttl pk-yrs)    Types: Cigarettes    Start date: 84    Quit date: 2005    Years since quitting: 20.4   Smokeless tobacco: Never  Vaping Use   Vaping status: Never Used  Substance and Sexual Activity   Alcohol  use: Not Currently   Drug use: Never   Sexual activity: Not Currently  Other Topics Concern   Not on file  Social History Narrative   Not on file   Social Drivers of Health   Financial Resource Strain: Low Risk  (05/04/2022)   Received from [facility]   Overall Financial Resource Strain (CARDIA)    Difficulty of Paying Living Expenses: Not very hard  Food Insecurity: No Food Insecurity (11/21/2023)   Hunger Vital Sign    Worried About Running Out of Food in the Last Year: Never  true    Ran Out of Food in the Last Year: Never true  Transportation Needs: No Transportation Needs (11/21/2023)   PRAPARE - Transportation    Lack of Transportation (Medical): No    Lack of Transportation (Non-Medical): No  Physical Activity: Inactive (03/29/2018)   Received from [facility]   Exercise Vital Sign    Days of Exercise per Week: 0 days    Minutes of Exercise per Session: 0 min  Stress: No Stress Concern Present (03/29/2018)   Received from [facility] - Occupational Stress Questionnaire    Feeling of Stress : Not at all  Social Connections: Socially Isolated (09/01/2023)   Social Connection and Isolation Panel    Frequency of Communication with Friends and Family: More than three times a week    Frequency of Social Gatherings with Friends and Family: More than three times a week    Attends Religious Services: Never    Database administrator or Organizations: No    Attends Banker Meetings: Never    Marital Status: Divorced  Catering manager Violence: Not At Risk (09/01/2023)   Humiliation, Afraid, Rape, and Kick questionnaire    Fear of Current or Ex-Partner: No    Emotionally Abused: No    Physically Abused: No    Sexually Abused: No     FAMILY HISTORY:  Family History  Problem Relation Age of Onset   Cerebral aneurysm Father    Cerebral aneurysm Sister       REVIEW OF SYSTEMS:  Review of Systems  Unable to perform ROS: Dementia  Gastrointestinal:  Positive for abdominal pain.    VITAL SIGNS:  Temp:  [97.8 F (36.6 C)-98.5 F (36.9 C)] 98.5 F (36.9 C) (06/23 0444) Pulse Rate:  [70-105] 105 (06/23 0444) Resp:  [12-20] 17 (06/22 2015) BP: (111-130)/(73-91) 118/78 (06/23 0444) SpO2:  [94 %-100 %] 100 % (06/23 0444)             INTAKE/OUTPUT:  06/22 0701 - 06/23 0700 In: 150.2 [P.O.:50; IV Piggyback:100.2] Out: -   PHYSICAL EXAM:  Physical Exam Vitals and nursing note reviewed. Exam  conducted with a chaperone present.  Constitutional:      General: He is not in acute distress.    Comments: Patient is alert, he is oriented to person, NAD  HENT:     Head: Normocephalic and atraumatic.  Eyes:     General: No scleral icterus.    Extraocular Movements: Extraocular movements intact.    Cardiovascular:     Rate and Rhythm: Tachycardia present.     Heart sounds: Normal heart sounds.  Pulmonary:     Effort: Pulmonary effort is normal. No respiratory distress.     Comments: On [place] Abdominal:     General: There is distension.     Palpations: Abdomen is soft.     Tenderness: There is no abdominal tenderness. There is no guarding or rebound.     Comments: Abdomen is soft, he is certainly distended and tympanic, although demented he does not appear overtly tender. He does not appear peritonitic at this time.   Genitourinary:    Comments: Indwelling suprapubic catheter   Neurological:     Mental Status: He is alert.     Comments: Oriented to person, confused  Psychiatric:     Comments: Unable to reliably assess       Labs:     Latest Ref Rng & Units 11/22/2023    1:31 AM 11/21/2023    5:30 PM 09/15/2023    6:21 PM  CBC  WBC 4.0 - 10.5 K/uL 14.6  9.9  9.6   Hemoglobin 13.0 - 17.0 g/dL 87.7  88.2  88.0   Hematocrit 39.0 - 52.0 % 40.1  37.1  38.5   Platelets 150 - 400 K/uL 275  268  250       Latest Ref Rng & Units 11/22/2023    1:31 AM 11/21/2023    5:30 PM 09/15/2023    6:21 PM  CMP  Glucose 70 - 99 mg/dL 880  897  887   BUN 8 - 23 mg/dL 14  14  13    Creatinine 0.61 - 1.24 mg/dL 9.24  9.29  9.17   Sodium 135 - 145 mmol/L 137  136  139   Potassium 3.5 - 5.1 mmol/L 3.1  2.7  3.5   Chloride 98 - 111 mmol/L 106  106  107   CO2 22 - 32 mmol/L 23  21  25    Calcium  8.9 - 10.3 mg/dL 8.4  8.3  9.1   Total Protein 6.5 - 8.1 g/dL  6.4  7.1   Total Bilirubin 0.0 - 1.2 mg/dL  0.8  0.6   Alkaline Phos 38 - 126 U/L  122  73   AST 15 - 41 U/L  13  14   ALT 0 - 44 U/L   11  10      Imaging studies:   CT Abdomen/Pelvis (11/21/2023) personally reviewed with significant dilation of the entire colon, no evidence of distal obstruction, no free air, no pneumatosis, and radiologist report reviewed below:  IMPRESSION: 1. The colon is gas distended throughout the entirety from the cecum to the rectum. The sigmoid colon is redundant and tortuous but no evidence of twisting to suggest volvulus. 2. No evidence of small-bowel obstruction. 3. Findings most suggestive of colonic ileus. 4. Suprapubic catheter in decompressed bladder.   Assessment/Plan:  83 y.o. 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   - He certainly has more progressive dilation of his colon since examination in April of this year. He continues to have bowel function, no evidence of complete  obstruction. 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 He is fortunately without overt peritonitis nor bowel compromise at this time. This is a quite challenging situation with likely very limited options.   - I agree with GI consultation. Likely benefit from colonoscopy and decompression if deemed appropriate. I suspect neostigmine will be contraindicated in this case given his cardiac and urinary history, and carry significant risk regardless given his age and functional status.   - From a surgical perspective. I do not think he needs emergent surgical intervention. There is no evidence of bowel compromise on imaging nor examination at this time. At this point, he would require subtotal colectomy and end ileostomy creation which would carry significant morbidity  and mortality in his situation  - I do think involving palliative care and establishing goals of care would be appropriate in this setting   - Would continue NPO; okay sips with medications - Monitor abdominal examination; on-going bowel function    - Okay to work with therapies  - Limit narcotics as feasible  - Further management per primary service  All of the above findings and recommendations were discussed with the medical team.   Thank you for the opportunity to participate in this patient's care.   -- Arthea Platt, PA-C [facility] 11/22/2023, 7:20 AM M-F: 7am - 4pm

## 2023-11-23 DIAGNOSIS — K5981 Ogilvie syndrome: Secondary | ICD-10-CM | POA: Diagnosis not present

## 2023-11-23 LAB — CBC
HCT: 36.1 % — ABNORMAL LOW (ref 39.0–52.0)
Hemoglobin: 10.9 g/dL — ABNORMAL LOW (ref 13.0–17.0)
MCH: 25.2 pg — ABNORMAL LOW (ref 26.0–34.0)
MCHC: 30.2 g/dL (ref 30.0–36.0)
MCV: 83.6 fL (ref 80.0–100.0)
Platelets: 236 10*3/uL (ref 150–400)
RBC: 4.32 MIL/uL (ref 4.22–5.81)
RDW: 16.1 % — ABNORMAL HIGH (ref 11.5–15.5)
WBC: 10 10*3/uL (ref 4.0–10.5)
nRBC: 0 % (ref 0.0–0.2)

## 2023-11-23 LAB — BASIC METABOLIC PANEL WITH GFR
Anion gap: 5 (ref 5–15)
BUN: 11 mg/dL (ref 8–23)
CO2: 24 mmol/L (ref 22–32)
Calcium: 8.2 mg/dL — ABNORMAL LOW (ref 8.9–10.3)
Chloride: 111 mmol/L (ref 98–111)
Creatinine, Ser: 0.66 mg/dL (ref 0.61–1.24)
GFR, Estimated: >60 mL/min (ref >60)
Glucose, Bld: 96 mg/dL (ref 70–99)
Potassium: 3.6 mmol/L (ref 3.5–5.1)
Sodium: 140 mmol/L (ref 135–145)

## 2023-11-23 LAB — MAGNESIUM: Magnesium: 1.9 mg/dL (ref 1.7–2.4)

## 2023-11-23 MED ORDER — POTASSIUM CHLORIDE CRYS ER 20 MEQ PO TBCR
40.0000 meq | EXTENDED_RELEASE_TABLET | Freq: Every day | ORAL | Status: AC
  Administered 2023-11-23: 40 meq via ORAL
  Administered 2023-11-24: 10 meq via ORAL
  Administered 2023-11-25: 40 meq via ORAL
  Filled 2023-11-23 (×3): qty 2

## 2023-11-23 MED ORDER — SODIUM CHLORIDE 0.9 % IV SOLN: INTRAVENOUS | Status: DC

## 2023-11-23 NOTE — Progress Notes (Signed)
 PROGRESS NOTE  Adam Rios.  FMW:[id number] DOB: 06-17-1940 DOA: 11/21/2023 PCP: [facility]  Brief Narrative: 83 y.o. 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 History per EMS/EDP -- evidently developed constipation again several weeks ago.  Then in the last few days, his belly became distended, painful.  An x-ray at his facility two days ago showed volvulus reportedly.  He was having jelly like stools.  So he was sent to the ER.  CT showed ileus.  Per surgery and GI, diagnosis made of mild colonic pseudoobstruction, will attempt to manage conservatively.     Assessment & Plan: Acute pseudo-obstruction of colon - Rectal tube in place.  Very clean so far, no output - continue IVF -- he pulled out IV last night, replaced today as NPO.    - follow electrolytes.  - Keep rectal tube in place.  Appreciate Gi and surgery input  Goals of care: - will place palliative consult and touch base with patient's PCP at [facility] today.     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 - EKG good here, read as possible dextrocardia, not confirmed on CXR   Dementia without behavioral disturbance (HCC) - Hold sertraline    Iron  deficiency anemia due to chronic blood loss Hemoglobin stable relative to baseline, no bleeding reported   Chronic indwelling Foley catheter  - secondary to chronic neurogenic bladder  Leukocytosis:  - likely secondary to #1 above, reactive - will trend   Chronic diastolic CHF (congestive heart failure) (HCC) Appears dehydrated - Hold spironolactone , torsemide , metoprolol    History of CVA (cerebrovascular accident) - Hold Lipitor for now    Neurogenic bladder Indwelling SP catheter in place.  No fever or leukocytosis - Continue Vesicare, finasteride  - Hold Flomax , Myrbetriq  for now in light of #1 above   Essential hypertension Blood pressure normal - Hold torsemide , spironolactone , metoprolol  for now     DVT prophylaxis:  enoxaparin  (LOVENOX ) injection 40 mg Start: 11/22/23 0900  Code Status:   Code Status: Limited: Do not attempt resuscitation (DNR) -DNR-LIMITED -Do Not Intubate/DNI  Level of care: Telemetry Medical Status is: Inpatient Dispo:  pending resolution of pseudo-obstruction   Consults called: GI, Surgery   Subjective: Patient awake and alert today.  Reports abd distension/bloating today.  Rectal tube in place.  NPO  Objective: Vitals:   11/22/23 2029 11/23/23 0245 11/23/23 0706 11/23/23 0732  BP: 123/73 (!) 156/95 (!) 146/88 122/82  Pulse: 73 95 74   Resp: 19 18 18    Temp: 98.3 F (36.8 C)  97.6 F (36.4 C)   TempSrc:   Oral   SpO2: 100%  100%     Intake/Output Summary (Last 24 hours) at 11/23/2023 1359 Last data filed at 11/23/2023 1026 Gross per 24 hour  Intake 1849.44 ml  Output 1450 ml  Net 399.44 ml   There were no vitals filed for this visit. There is no height or weight on file to calculate BMI.  Gen: 83 y.o. male in no apparent distress.  Nontoxic Pulm: Non-labored breathing.  Clear to auscultation bilaterally.  CV: Regular rate and rhythm. No murmur, rub, or gallop. No JVD GI: Abdomen soft, distended, pain BL lower  quadrants with deep palpation Ext: Warm, no deformities Skin: No rashes, lesions  Neuro: Alert and oriented to person and hospital.  Moving all limbs symmetrically.  Psych: Calm     I have personally reviewed the following labs and images: CBC: Recent Labs  Lab 11/21/23 1730 11/22/23 0131 11/23/23 0451  WBC 9.9 14.6* 10.0  NEUTROABS 6.8  --   --   HGB 11.7* 12.2* 10.9*  HCT 37.1* 40.1 36.1*  MCV 81.2 82.5 83.6  PLT 268 275 236   BMP &GFR Recent Labs   Lab 11/21/23 1730 11/22/23 0131 11/23/23 0451  NA 136 137 140  K 2.7* 3.1* 3.6  CL 106 106 111  CO2 21* 23 24  GLUCOSE 102* 119* 96  BUN 14 14 11   CREATININE 0.70 0.75 0.66  CALCIUM  8.3* 8.4* 8.2*  MG 2.1 2.0 1.9   CrCl cannot be calculated (Unknown ideal weight.). Liver & Pancreas: Recent Labs  Lab 11/21/23 1730  AST 13*  ALT 11  ALKPHOS 122  BILITOT 0.8  PROT 6.4*  ALBUMIN 2.8*   Recent Labs  Lab 11/21/23 1730  LIPASE 22   No results for input(s): AMMONIA in the last 168 hours. Diabetic: No results for input(s): HGBA1C in the last 72 hours. No results for input(s): GLUCAP in the last 168 hours. Cardiac Enzymes: No results for input(s): CKTOTAL, CKMB, CKMBINDEX, TROPONINI in the last 168 hours. No results for input(s): PROBNP in the last 8760 hours. Coagulation Profile: No results for input(s): INR, PROTIME in the last 168 hours. Thyroid  Function Tests: No results for input(s): TSH, T4TOTAL, FREET4, T3FREE, THYROIDAB in the last 72 hours. Lipid Profile: No results for input(s): CHOL, HDL, LDLCALC, TRIG, CHOLHDL, LDLDIRECT in the last 72 hours. Anemia Panel: No results for input(s): VITAMINB12, FOLATE, FERRITIN, TIBC, IRON , RETICCTPCT in the last 72 hours. Urine analysis:    Component Value Date/Time   COLORURINE YELLOW (A) 09/15/2023 1821   APPEARANCEUR CLOUDY (A) 09/15/2023 1821   LABSPEC 1.026 09/15/2023 1821   PHURINE 5.0 09/15/2023 1821   GLUCOSEU NEGATIVE 09/15/2023 1821   HGBUR SMALL (A) 09/15/2023 1821   BILIRUBINUR NEGATIVE 09/15/2023 1821   KETONESUR NEGATIVE 09/15/2023 1821   PROTEINUR 100 (A) 09/15/2023 1821   NITRITE POSITIVE (A) 09/15/2023 1821   LEUKOCYTESUR LARGE (A) 09/15/2023 1821   Sepsis Labs: Invalid input(s): PROCALCITONIN, LACTICIDVEN  Microbiology: No results found for this or any previous visit (from the past 240 hours).  Radiology Studies: No results  found.   Scheduled Meds:  Chlorhexidine  Gluconate Cloth  6 each Topical Daily   enoxaparin  (LOVENOX ) injection  40 mg Subcutaneous Q24H   fesoterodine   4 mg Oral Daily   finasteride   5 mg Oral Daily   potassium chloride   40 mEq Oral Daily   Continuous Infusions:  sodium chloride  100 mL/hr at 11/23/23 1216     LOS: 2 days   35 minutes with more than 50% spent in reviewing records, counseling patient/family and coordinating care.  Reyes VEAR Gaw, MD [facility] [email or web address] 11/23/2023, 1:59 PM

## 2023-11-23 NOTE — Progress Notes (Addendum)
 [facility] SURGICAL PROGRESS NOTE (cpt [phone])  Hospital Day(s): 2.   Interval History: Patient seen and examined, no acute events or new complaints overnight. Patient sleeping this morning; arouses briefly. History lmited secondary to dementia. He is without leukocytosis this morning; WBC 10.0K. Hgb to 10.9 (from 12.2). Hyperkalemia resolved; now 3.6. Magnesium  normal at 1.9. Renal function normal; sCr - 0.66; UO - 950 ccs. No new imaging studies this morning. Rectal tube in place; no significant output  Review of Systems:  Unable to reliably perform secondary to dementia   Vital signs in last 24 hours: [min-max] current  Temp:  [97.4 F (36.3 C)-98.3 F (36.8 C)] 98.3 F (36.8 C) (06/23 2029) Pulse Rate:  [73-100] 95 (06/24 0245) Resp:  [18-19] 18 (06/24 0245) BP: (110-156)/(72-95) 156/95 (06/24 0245) SpO2:  [96 %-100 %] 100 % (06/23 2029)             Intake/Output last 2 shifts:  06/23 0701 - 06/24 0700 In: 1849.4 [I.V.:1449.4; IV Piggyback:400] Out: 950 [Urine:950]   Physical Exam:  Constitutional: sleeping; arouses briefly, NAD HENT: normocephalic without obvious abnormality  Eyes: PERRL, EOM's grossly intact and symmetric  Respiratory: breathing non-labored at rest  Cardiovascular: regular rate and sinus rhythm  Gastrointestinal: Abdomen is soft, he is distended; stable, although his dementia limits reliability of exam he does not appear overtly tender, no rebound/guarding. Rectal tube in place; no significant output Genitourinary: Foley in place; good UO    Labs:     Latest Ref Rng & Units 11/23/2023    4:51 AM 11/22/2023    1:31 AM 11/21/2023    5:30 PM  CBC  WBC 4.0 - 10.5 K/uL 10.0  14.6  9.9   Hemoglobin 13.0 - 17.0 g/dL 89.0  87.7  88.2   Hematocrit 39.0 - 52.0 % 36.1  40.1  37.1   Platelets 150 - 400 K/uL 236  275  268       Latest Ref Rng & Units 11/23/2023    4:51 AM 11/22/2023    1:31 AM 11/21/2023    5:30 PM  CMP  Glucose 70 - 99 mg/dL  96  880  897   BUN 8 - 23 mg/dL 11  14  14    Creatinine 0.61 - 1.24 mg/dL 9.33  9.24  9.29   Sodium 135 - 145 mmol/L 140  137  136   Potassium 3.5 - 5.1 mmol/L 3.6  3.1  2.7   Chloride 98 - 111 mmol/L 111  106  106   CO2 22 - 32 mmol/L 24  23  21    Calcium  8.9 - 10.3 mg/dL 8.2  8.4  8.3   Total Protein 6.5 - 8.1 g/dL   6.4   Total Bilirubin 0.0 - 1.2 mg/dL   0.8   Alkaline Phos 38 - 126 U/L   122   AST 15 - 41 U/L   13   ALT 0 - 44 U/L   11      Imaging studies: No new pertinent imaging studies   Assessment/Plan:  83 y.o. 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 He  is fortunately without overt peritonitis nor bowel compromise at this time. This is a quite challenging situation with likely very limited options.              - Appreciate GI evaluation and recommendations, agree with rectal tube. Noted role for decompressive colonoscopy with concern of recurrence given history of same in April. I suspect neostigmine will be contraindicated in this case given his cardiac and urinary history, and carry significant risk regardless given his age and functional status.              - From a surgical perspective. I do not think he needs emergent surgical intervention. There is no evidence of bowel compromise on imaging nor examination at this time. At this point, he would require subtotal colectomy and end ileostomy creation which would carry significant morbidity and mortality in his situation             - I do think involving palliative care and establishing goals of care  would be appropriate in this setting   - Correct electrolytes  - Can consider serial abdominal XRs - Monitor abdominal examination; on-going bowel function               - Okay to work with therapies             - Limit narcotics as feasible             - Further management per primary service  All of the above findings and recommendations were discussed with the medical team.   -- Arthea Platt, PA-C [facility] 11/23/2023, 7:09 AM M-F: 7am - 4pm

## 2023-11-23 NOTE — Progress Notes (Signed)
 Adam Copping, MD [facility]   [address], [address] [place], [place] [address] Phone: [phone] Fax : [phone]   Subjective: The patient has no complaints today.  He continues to have a distended abdomen and there is very little coming from his rectal tube.  The patient is being treated symptomatically with correction of his electrolytes.   Objective: Vital signs in last 24 hours: Vitals:   11/22/23 2029 11/23/23 0245 11/23/23 0706 11/23/23 0732  BP: 123/73 (!) 156/95 (!) 146/88 122/82  Pulse: 73 95 74   Resp: 19 18 18    Temp: 98.3 F (36.8 C)  97.6 F (36.4 C)   TempSrc:   Oral   SpO2: 100%  100%    Weight change:   Intake/Output Summary (Last 24 hours) at 11/23/2023 1524 Last data filed at 11/23/2023 1026 Gross per 24 hour  Intake 1849.44 ml  Output 1450 ml  Net 399.44 ml     Exam: Heart:: Regular rate and rhythm or without murmur or extra heart sounds Lungs: normal and clear to auscultation and percussion Abdomen: Soft distended and tympanic with decreased bowel sounds.   Lab Results: @LABTEST2 @ Micro Results: No results found for this or any previous visit (from the past 240 hours). Studies/Results: DG Abd Portable 1V Result Date: 11/22/2023 EXAM: 1 VIEW XRAY OF THE ABDOMEN SUPINE 11/22/2023 06:26:39 AM COMPARISON: CT of the abdomen and pelvis with contrast 11/21/2023. CLINICAL HISTORY: Acute pseudo-obstruction of colon FINDINGS: BOWEL: Diffuse colonic distention remains. No free air is present. PERITONEUM AND SOFT TISSUES: No abnormal calcifications. BONES: Left hip hemiarthroplasty is noted. Atherosclerotic changes are present. No acute osseous abnormality. IMPRESSION: 1. Diffuse colonic distention remains. 2. No free air. Electronically signed by: Lonni Necessary MD 11/22/2023 06:49 AM EDT RP Workstation: [id number]   CT ABDOMEN PELVIS W CONTRAST Result Date: 11/21/2023 CLINICAL DATA:  Abdominal distension. Abdominal pain. No bowel movement several  weeks. EXAM: CT ABDOMEN AND PELVIS WITH CONTRAST TECHNIQUE: Multidetector CT imaging of the abdomen and pelvis was performed using the standard protocol following bolus administration of intravenous contrast. RADIATION DOSE REDUCTION: This exam was performed according to the departmental dose-optimization program which includes automated exposure control, adjustment of the mA and/or kV according to patient size and/or use of iterative reconstruction technique. CONTRAST:  OMNIPAQUE  IOHEXOL  300 MG/ML  SOLN COMPARISON:  CT 08/31/2023 insert FINDINGS: Lower chest: Lung bases are clear. Hepatobiliary: No focal hepatic lesion. Normal gallbladder. No biliary duct dilatation. Common bile duct is normal. Pancreas: Pancreas is normal. No ductal dilatation. No pancreatic inflammation. Spleen: Normal spleen Adrenals/urinary tract: Adrenal glands are normal. RIGHT kidney is normal. LEFT kidney is absent. Suprapubic catheter within the bladder. The bladder is decompressed. Stomach/Bowel: Stomach is normal. Duodenum and small-bowel appear normal. Small bowel is decompressed. No small bowel dilatation evident. The terminal ileum is normal. Post appendectomy. There is gas within the cecum and transverse colon. The descending colon is stool filled. The sigmoid colon is redundant and gas filled. There is no evidence of twisting of the sigmoid colon to suggest volvulus pathophysiology there is frothy stool within the rectum. No intraperitoneal free air.  Pneumatosis intestinalis. Vascular/Lymphatic: Abdominal aorta is normal caliber. No periportal or retroperitoneal adenopathy. No pelvic adenopathy. Reproductive: Unremarkable Other: No free air or intraperitoneal free fluid Musculoskeletal: No aggressive osseous lesion. IMPRESSION: 1. The colon is gas distended throughout the entirety from the cecum to the rectum. The sigmoid colon is redundant and tortuous but no evidence of twisting to suggest volvulus. 2.  No evidence of  small-bowel obstruction. 3. Findings most suggestive of colonic ileus. 4. Suprapubic catheter in decompressed bladder. Electronically Signed   By: Jackquline Boxer M.D.   On: 11/21/2023 18:44   Medications: I have reviewed the patient's current medications. Scheduled Meds:  Chlorhexidine  Gluconate Cloth  6 each Topical Daily   enoxaparin  (LOVENOX ) injection  40 mg Subcutaneous Q24H   fesoterodine   4 mg Oral Daily   finasteride   5 mg Oral Daily   potassium chloride   40 mEq Oral Daily   Continuous Infusions:  sodium chloride  100 mL/hr at 11/23/23 1216   PRN Meds:.acetaminophen  **OR** acetaminophen , ondansetron  **OR** ondansetron  (ZOFRAN ) IV   Assessment: Principal Problem:   Acute pseudo-obstruction of colon Active Problems:   Essential hypertension   Neurogenic bladder   History of CVA (cerebrovascular accident)   Chronic diastolic CHF (congestive heart failure) (HCC)   Chronic indwelling Foley catheter   Iron  deficiency anemia due to chronic blood loss   Hypokalemia   Dementia without behavioral disturbance (HCC)   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  I would recommend continued symptomatic treatment and repeat KUB tomorrow to assess whether or not there is resolution of his abdominal distention or at least improvement.   LOS: 2 days   Adam Copping, MD.FACG 11/23/2023, 3:24 PM Pager [phone] 7am-5pm  Check AMION for 5pm -7am coverage and on weekends

## 2023-11-24 ENCOUNTER — Inpatient Hospital Stay

## 2023-11-24 DIAGNOSIS — K5981 Ogilvie syndrome: Secondary | ICD-10-CM | POA: Diagnosis not present

## 2023-11-24 LAB — BASIC METABOLIC PANEL WITH GFR
Anion gap: 6 (ref 5–15)
BUN: 9 mg/dL (ref 8–23)
CO2: 22 mmol/L (ref 22–32)
Calcium: 8.1 mg/dL — ABNORMAL LOW (ref 8.9–10.3)
Chloride: 108 mmol/L (ref 98–111)
Creatinine, Ser: 0.52 mg/dL — ABNORMAL LOW (ref 0.61–1.24)
GFR, Estimated: >60 mL/min (ref >60)
Glucose, Bld: 69 mg/dL — ABNORMAL LOW (ref 70–99)
Potassium: 3.7 mmol/L (ref 3.5–5.1)
Sodium: 136 mmol/L (ref 135–145)

## 2023-11-24 LAB — CBC
HCT: 34.8 % — ABNORMAL LOW (ref 39.0–52.0)
Hemoglobin: 10.7 g/dL — ABNORMAL LOW (ref 13.0–17.0)
MCH: 25.3 pg — ABNORMAL LOW (ref 26.0–34.0)
MCHC: 30.7 g/dL (ref 30.0–36.0)
MCV: 82.3 fL (ref 80.0–100.0)
Platelets: 230 10*3/uL (ref 150–400)
RBC: 4.23 MIL/uL (ref 4.22–5.81)
RDW: 16.3 % — ABNORMAL HIGH (ref 11.5–15.5)
WBC: 9.4 10*3/uL (ref 4.0–10.5)
nRBC: 0 % (ref 0.0–0.2)

## 2023-11-24 LAB — GLUCOSE, CAPILLARY
Glucose-Capillary: 67 mg/dL — ABNORMAL LOW (ref 70–99)
Glucose-Capillary: 75 mg/dL (ref 70–99)

## 2023-11-24 LAB — MAGNESIUM: Magnesium: 1.7 mg/dL (ref 1.7–2.4)

## 2023-11-24 MED ORDER — DEXTROSE-SODIUM CHLORIDE 5-0.9 % IV SOLN: INTRAVENOUS | Status: AC

## 2023-11-24 MED ORDER — DEXTROSE 50 % IV SOLN
1.0000 | INTRAVENOUS | Status: DC | PRN
  Filled 2023-11-24: qty 50

## 2023-11-24 NOTE — Progress Notes (Signed)
  [facility] (cpt [phone])  Hospital Day(s): 3.   Interval History: Patient seen and examined, no acute events or new complaints overnight. Patient most alert I have seen this admission. He denied any abdominal pain. Remains distended. History lmited secondary to dementia. He is without leukocytosis this morning; WBC 9.4K. Hgb to 10.7; stable. Hyperkalemia resolved; now 3.7. Magnesium  normal at 1.7. Renal function normal; sCr - 0.52; UO - 1350 ccs. No new imaging studies this morning. Rectal tube in place; no significant output but does have BM recorded x3.   Review of Systems:  Unable to reliably perform secondary to dementia   Vital signs in last 24 hours: [min-max] current  Temp:  [98.4 F (36.9 C)-99.1 F (37.3 C)] 98.4 F (36.9 C) (06/25 0334) Pulse Rate:  [79-92] 79 (06/25 0334) Resp:  [14-20] 20 (06/25 0334) BP: (131-142)/(81-87) 136/84 (06/25 0334) SpO2:  [96 %-98 %] 97 % (06/25 0334)             Intake/Output last 2 shifts:  06/24 0701 - 06/25 0700 In: 1039 [I.V.:1039] Out: 1350 [Urine:1350]   Physical Exam:  Constitutional: Alert, sitting up in bed; NAD. Oriented to person and place HENT: normocephalic without obvious abnormality  Eyes: PERRL, EOM's grossly intact and symmetric  Respiratory: breathing non-labored at rest  Cardiovascular: regular rate and sinus rhythm  Gastrointestinal: Abdomen is soft, he does not appear overtly tender, remains distended but this is improved some today, no rebound/guarding. Rectal tube in place; having stool out Genitourinary: Suprapubic catheter in place; good UO    Labs:     Latest Ref Rng & Units 11/24/2023    4:10 AM 11/23/2023    4:51 AM 11/22/2023    1:31 AM  CBC  WBC 4.0 - 10.5 K/uL 9.4  10.0  14.6   Hemoglobin 13.0 - 17.0 g/dL 89.2  89.0  87.7   Hematocrit 39.0 - 52.0 % 34.8  36.1  40.1   Platelets 150 - 400 K/uL 230  236  275       Latest Ref Rng & Units 11/24/2023    4:10 AM  11/23/2023    4:51 AM 11/22/2023    1:31 AM  CMP  Glucose 70 - 99 mg/dL 69  96  880   BUN 8 - 23 mg/dL 9  11  14    Creatinine 0.61 - 1.24 mg/dL 9.47  9.33  9.24   Sodium 135 - 145 mmol/L 136  140  137   Potassium 3.5 - 5.1 mmol/L 3.7  3.6  3.1   Chloride 98 - 111 mmol/L 108  111  106   CO2 22 - 32 mmol/L 22  24  23    Calcium  8.9 - 10.3 mg/dL 8.1  8.2  8.4      Imaging studies:   KUB (11/24/2023) personally reviewed with peristent dilation of large bowel, question slightly improved, no gross free air, no appreciable pneumatosis, and radiologist report pending   Assessment/Plan:  83 y.o. 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 He is fortunately without overt peritonitis nor bowel compromise at this time. This is a  quite challenging situation with likely very limited options. His exam and KUB are slightly improved today which is promising, but certainly not out of the woods.              - Appreciate GI evaluation and recommendations, agree with rectal tube; now seems to be having output, still with some issue leaking around this. Noted role for decompressive colonoscopy with concern of recurrence given history of same in April. I suspect neostigmine will be contraindicated in this case given his cardiac and urinary history, and carry significant risk regardless given his age and functional status.              - From a surgical perspective. I do not think he needs emergent surgical intervention. There is no evidence of bowel compromise on imaging nor examination at this  time. At this point, he would require subtotal colectomy and end ileostomy creation which would carry significant morbidity and mortality in his situation             - I do think involving palliative care and establishing goals of care would be appropriate in this setting   - I do think we can trial sips of CLD with caution; low threshold for SLP evaluation if needed.   - Correct electrolytes; stable  - Can consider serial abdominal XRs - Monitor abdominal examination; on-going bowel function               - Okay to work with therapies             - Limit narcotics as feasible             - Further management per primary service  All of the above findings and recommendations were discussed with the medical team.   -- Arthea Platt, PA-C [facility] 11/24/2023, 7:32 AM M-F: 7am - 4pm

## 2023-11-24 NOTE — Progress Notes (Signed)
 Patient noted to have urine leaking from urethra despite suprapubic catheter in place. This RN received order to flush suprapubic catheter however this RN and Charge RN attempted to flush and met resistance multiple times and catheter could not be flushed or aspirated. Bladder scan showed no more than urine upon multiple bladder scans. Dr. Marsa notified; MD advised having suprapubic catheter exchanged when trained/appropriate staff available.

## 2023-11-24 NOTE — Progress Notes (Signed)
 PROGRESS NOTE    Adam Rios.   FMW:[id number] DOB: 02/06/1941  DOA: 11/21/2023 Date of Service: 11/24/23 which is hospital day 3  PCP: [facility], [facility] course / significant events:   HPI: 83 y.o. 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 Evidently developed constipation several weeks ago. Then in the last few days, his belly became distended, painful. An x-ray at his facility two days prior to ED showed volvulus reportedly. He was having jelly like stools. So he was sent to the ER.   06/22: to ED, CT showed ileus. EDP spoke with Dr. Marinda General Surgery and Dr. Therisa GI. The consensus is that it may be ileus or (given degree of distension on CT) mild colonic pseudoobstruction, will attempt to manage conservatively. Admitted to hospiatlist  06/23: Had BM Overnight per RN. Per gen surg - history and exam consistent with Ogilvie syndrome, recs rectal tube placement to decompress, question whether candidate for atropine, would defer to GI. Per GI - keep K over 4 and Mg over 2, agree rectal tube, consider decompression colonoscopy but defer for now given likelihood of recurrence will try to maintatin conservative tx 06/24: not much improvement, plan KUB tomorrow am 06/25: KUB showing minimal improvement      Consultants:  General Surgery Gastroenterology Palliative Care   Procedures/Surgeries: none      ASSESSMENT & PLAN:   Ileus / Pseudoobstruction  Expect neostigmine will be contraindicated in this case given his cardiac and urinary history, and carry significant risk regardless given his age and functional status.  no evidence of bowel compromise on imaging nor examination at this time. At this point, he would require subtotal colectomy and end ileostomy creation which would  carry significant morbidity and mortality in his situation  Trial CLD sips  Rectal tube per GI  Optimize electrolytes - keep K over 4 and Mg over 2 Ok for up w/ PT/OT  Hypoglycemia While NPO D5NS fluids D50 amp prn  Monitor CLG as needed / Glc w/ am labs  Goals of care: Palliative consult    Hypokalemia Replace as needed Monitor BMP Pt has been refusing K po supplementation   Dementia without behavioral disturbance  hold sertraline    Iron  deficiency anemia due to chronic blood loss Hemoglobin stable relative to baseline, no bleeding reported Monitor CBC   Chronic indwelling Foley catheter secondary to chronic neurogenic bladder Flush as needed Continue Vesicare, finasteride  Hold Flomax , Myrbetriq  for now in light of #1 above   Leukocytosis: resolved likely secondary to lieus, reactive will trend   Chronic diastolic CHF (congestive heart failure)  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 Significantly low or high BMI is associated with higher medical risk.  Underweight - under 18  overweight - 25 to 29 obese - 30 or more Class 1 obesity: BMI of 30.0 to 34 Class 2 obesity: BMI of 35.0 to 39 Class 3 obesity: BMI of 40.0 to 49 Super Morbid Obesity: BMI 50-59 Super-super Morbid Obesity: BMI 60+ Healthy  nutrition and physical activity advised as adjunct to other disease management and risk reduction treatments    DVT prophylaxis: lovenox  IV fluids: D5NS continuous IV fluids  Nutrition: clears advance per gen surg / GI  Central lines / other devices: rectal tube, chronic suprapubic catheter   Code Status: DNR ACP documentation reviewed: none on file in [facility]  TOC needs: TBD Medical  barriers to dispo: still not demonstrating adequate bowel function / po tolerance. Expected medical readiness for discharge next 2-3 days .              Subjective / Brief ROS:  Patient reports abdomen feels a  bit better today Denies CP/SOB.  Pain controlled.  Denies new weakness.    Family Communication: none at this time     Objective Findings:  Vitals:   11/23/23 2037 11/24/23 0334 11/24/23 0748 11/24/23 1545  BP: 131/81 136/84 (!) 143/83 (!) 140/86  Pulse: 86 79 88 100  Resp: 20 20 16 15   Temp: 98.8 F (37.1 C) 98.4 F (36.9 C) 98.3 F (36.8 C) 98.2 F (36.8 C)  TempSrc: Oral Oral Oral   SpO2: 96% 97% 97% 96%    Intake/Output Summary (Last 24 hours) at 11/24/2023 1619 Last data filed at 11/24/2023 1446 Gross per 24 hour  Intake 803.94 ml  Output 1500 ml  Net -696.06 ml   There were no vitals filed for this visit.  Examination:  Physical Exam Constitutional:      General: He is not in acute distress.  Cardiovascular:     Rate and Rhythm: Normal rate and regular rhythm.  Pulmonary:     Effort: Pulmonary effort is normal.     Breath sounds: Normal breath sounds.  Abdominal:     General: Bowel sounds are increased.     Palpations: Abdomen is soft. There is no fluid wave.     Tenderness: There is generalized abdominal tenderness. There is no guarding or rebound.   Skin:    General: Skin is warm and dry.   Neurological:     Mental Status: He is alert.   Psychiatric:        Mood and Affect: Mood normal.        Behavior: Behavior normal.          Scheduled Medications:   Chlorhexidine  Gluconate Cloth  6 each Topical Daily   enoxaparin  (LOVENOX ) injection  40 mg Subcutaneous Q24H   fesoterodine   4 mg Oral Daily   finasteride   5 mg Oral Daily   potassium chloride   40 mEq Oral Daily    Continuous Infusions:  dextrose  5 % and 0.9 % NaCl 75 mL/hr at 11/24/23 1541    PRN Medications:  acetaminophen  **OR** acetaminophen , dextrose ,  ondansetron  **OR** ondansetron  (ZOFRAN ) IV  Antimicrobials from admission:  Anti-infectives (From admission, onward)    None           Data Reviewed:  I have personally reviewed the following...  CBC: Recent Labs  Lab 11/21/23 1730 11/22/23 0131 11/23/23 0451 11/24/23 0410  WBC 9.9 14.6* 10.0 9.4  NEUTROABS 6.8  --   --   --   HGB 11.7* 12.2* 10.9* 10.7*  HCT 37.1* 40.1 36.1* 34.8*  MCV 81.2 82.5 83.6 82.3  PLT 268 275 236 230   Basic Metabolic Panel: Recent Labs  Lab 11/21/23 1730 11/22/23 0131 11/23/23 0451 11/24/23 0410  NA 136 137 140 136  K 2.7* 3.1* 3.6 3.7  CL 106 106 111 108  CO2 21*  23 24 22   GLUCOSE 102* 119* 96 69*  BUN 14 14 11 9   CREATININE 0.70 0.75 0.66 0.52*  CALCIUM  8.3* 8.4* 8.2* 8.1*  MG 2.1 2.0 1.9 1.7   GFR: CrCl cannot be calculated (Unknown ideal weight.). Liver Function Tests: Recent Labs  Lab 11/21/23 1730  AST 13*  ALT 11  ALKPHOS 122  BILITOT 0.8  PROT 6.4*  ALBUMIN 2.8*   Recent Labs  Lab 11/21/23 1730  LIPASE 22   No results for input(s): AMMONIA in the last 168 hours. Coagulation Profile: No results for input(s): INR, PROTIME in the last 168 hours. Cardiac Enzymes: No results for input(s): CKTOTAL, CKMB, CKMBINDEX, TROPONINI in the last 168 hours. BNP (last 3 results) No results for input(s): PROBNP in the last 8760 hours. HbA1C: No results for input(s): HGBA1C in the last 72 hours. CBG: Recent Labs  Lab 11/24/23 0744 11/24/23 0917  GLUCAP 67* 75   Lipid Profile: No results for input(s): CHOL, HDL, LDLCALC, TRIG, CHOLHDL, LDLDIRECT in the last 72 hours. Thyroid  Function Tests: No results for input(s): TSH, T4TOTAL, FREET4, T3FREE, THYROIDAB in the last 72 hours. Anemia Panel: No results for input(s): VITAMINB12, FOLATE, FERRITIN, TIBC, IRON , RETICCTPCT in the last 72 hours. Most Recent Urinalysis On File:     Component Value Date/Time    COLORURINE YELLOW (A) 09/15/2023 1821   APPEARANCEUR CLOUDY (A) 09/15/2023 1821   LABSPEC 1.026 09/15/2023 1821   PHURINE 5.0 09/15/2023 1821   GLUCOSEU NEGATIVE 09/15/2023 1821   HGBUR SMALL (A) 09/15/2023 1821   BILIRUBINUR NEGATIVE 09/15/2023 1821   KETONESUR NEGATIVE 09/15/2023 1821   PROTEINUR 100 (A) 09/15/2023 1821   NITRITE POSITIVE (A) 09/15/2023 1821   LEUKOCYTESUR LARGE (A) 09/15/2023 1821   Sepsis Labs: @LABRCNTIP (procalcitonin:4,lacticidven:4) Microbiology: No results found for this or any previous visit (from the past 240 hours).    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 No pneumoperitoneum. No organomegaly or radiopaque calculi. Diffuse aortoiliac atherosclerosis. Left hip arthroplasty appears anatomically aligned. Diffuse osteopenia.Multilevel degenerative disc disease of the spine. IMPRESSION: Persistent gaseous distension of the colon, overall slightly decreased in the interim. Electronically Signed   By: Rogelia Myers M.D.   On: 11/24/2023 11:55   DG Abd Portable 1V Result Date: 11/22/2023 EXAM: 1 VIEW XRAY OF THE ABDOMEN SUPINE 11/22/2023 06:26:39 AM COMPARISON: CT of the abdomen and pelvis with contrast 11/21/2023. CLINICAL HISTORY: Acute pseudo-obstruction of colon FINDINGS: BOWEL: Diffuse colonic distention remains. No free air is present. PERITONEUM AND SOFT TISSUES: No abnormal calcifications. BONES: Left hip hemiarthroplasty is noted. Atherosclerotic changes are present. No acute osseous abnormality. IMPRESSION: 1. Diffuse colonic distention remains. 2. No free air. Electronically signed by: Lonni Necessary MD 11/22/2023 06:49 AM EDT RP Workstation: [id number]   CT ABDOMEN PELVIS W CONTRAST Result Date:  11/21/2023 CLINICAL DATA:  Abdominal distension. Abdominal pain. No bowel movement several weeks. EXAM: CT ABDOMEN AND PELVIS WITH CONTRAST TECHNIQUE: Multidetector CT imaging of the abdomen and pelvis was performed using the standard protocol following bolus administration of intravenous contrast. RADIATION DOSE REDUCTION: This exam was performed according to the departmental dose-optimization program which includes automated exposure control, adjustment of the mA and/or kV according to patient size and/or use of iterative reconstruction technique. CONTRAST:  OMNIPAQUE  IOHEXOL  300 MG/ML  SOLN COMPARISON:  CT 08/31/2023 insert FINDINGS: Lower chest: Lung bases are clear. Hepatobiliary: No focal hepatic lesion. Normal gallbladder. No biliary duct dilatation. Common bile duct is normal. Pancreas: Pancreas is normal. No ductal dilatation. No pancreatic inflammation. Spleen: Normal spleen Adrenals/urinary tract: Adrenal glands are normal. RIGHT kidney is normal. LEFT kidney is absent. Suprapubic catheter within the bladder. The bladder is decompressed. Stomach/Bowel: Stomach is normal. Duodenum and small-bowel appear normal. Small bowel is decompressed. No small bowel dilatation evident. The terminal ileum is normal. Post appendectomy. There is gas within the cecum and transverse colon. The descending colon is stool filled. The sigmoid colon is redundant and gas filled. There is no evidence of twisting of the sigmoid colon to suggest volvulus pathophysiology there is frothy stool within the rectum. No intraperitoneal free air.  Pneumatosis intestinalis. Vascular/Lymphatic: Abdominal aorta is normal caliber. No periportal or retroperitoneal adenopathy. No pelvic adenopathy. Reproductive: Unremarkable Other: No free air or intraperitoneal free fluid Musculoskeletal: No aggressive osseous lesion. IMPRESSION: 1. The colon is gas distended throughout the entirety from the cecum to the rectum. The sigmoid colon is  redundant and tortuous but no evidence of twisting to suggest volvulus. 2. No evidence of small-bowel obstruction. 3. Findings most suggestive of colonic ileus. 4. Suprapubic catheter in decompressed bladder. Electronically Signed   By: Jackquline Boxer M.D.   On: 11/21/2023 18:44         Teondra Newburg, DO [facility] 11/24/2023, 4:19 PM    Dictation software may have been used to generate the above note. Typos may occur and escape review in typed/dictated notes. Please contact Dr Marsa directly for clarity if needed.  Staff may message me via secure chat in Epic  but this may not receive an immediate response,  please page me for urgent matters!  If 7PM-7AM, please contact night coverage [email or web address]

## 2023-11-25 DIAGNOSIS — K5981 Ogilvie syndrome: Secondary | ICD-10-CM | POA: Diagnosis not present

## 2023-11-25 MED ORDER — POLYETHYLENE GLYCOL 3350 17 G PO PACK
17.0000 g | PACK | Freq: Every day | ORAL | Status: DC
  Administered 2023-11-25 – 2023-11-27 (×3): 17 g via ORAL
  Filled 2023-11-25 (×3): qty 1

## 2023-11-25 MED ORDER — SENNOSIDES-DOCUSATE SODIUM 8.6-50 MG PO TABS
2.0000 | ORAL_TABLET | Freq: Two times a day (BID) | ORAL | Status: AC
  Administered 2023-11-25 – 2023-11-27 (×4): 2 via ORAL
  Filled 2023-11-25 (×4): qty 2

## 2023-11-25 MED ORDER — LORAZEPAM 2 MG/ML IJ SOLN
2.0000 mg | Freq: Four times a day (QID) | INTRAMUSCULAR | Status: DC | PRN
  Administered 2023-11-25: 2 mg via INTRAVENOUS
  Filled 2023-11-25: qty 1

## 2023-11-25 NOTE — Progress Notes (Signed)
 PROGRESS NOTE    Adam Rios.   FMW:[id number] DOB: 1940-10-11  DOA: 11/21/2023 Date of Service: 11/25/23 which is hospital day 4  PCP: [facility] course / significant events:   HPI: 83 y.o. 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 Evidently developed constipation several weeks ago. Then in the last few days, his belly became distended, painful. An x-ray at his facility two days prior to ED showed volvulus reportedly. He was having jelly like stools. So he was sent to the ER.   06/22: to ED, CT showed ileus. EDP spoke with Dr. Marinda General Surgery and Dr. Therisa GI. The consensus is that it may be ileus or (given degree of distension on CT) mild colonic pseudoobstruction, will attempt to manage conservatively. Admitted to hospiatlist  06/23: Had BM Overnight per RN. Per gen surg - history and exam consistent with Ogilvie syndrome, recs rectal tube placement to decompress, question whether candidate for atropine, would defer to GI. Per GI - keep K over 4 and Mg over 2, agree rectal tube, consider decompression colonoscopy but defer for now given likelihood of recurrence will try to maintatin conservative tx 06/24: not much improvement, plan KUB tomorrow am 06/25: KUB showing minimal improvement  06/26: pt reports feeling improved, not eating much, SLP to see. Suprapubic cath exchanged (clogged and unable to flush).      Consultants:  General Surgery Gastroenterology Palliative Care   Procedures/Surgeries: none      ASSESSMENT & PLAN:   Ileus / Pseudoobstruction  Expect neostigmine will be contraindicated in this case given his cardiac and urinary history, and carry significant risk regardless given his age and functional status.  no evidence of bowel compromise on  imaging nor examination at this time. At this point, he would require subtotal colectomy and end ileostomy creation which would carry significant morbidity and mortality in his situation  Trial CLD sips  Rectal tube per GI  Optimize electrolytes - keep K over 4 and Mg over 2 Ok for up w/ PT/OT  Hypoglycemia While NPO D5NS fluids D50 amp prn  Monitor CLG as needed / Glc w/ am labs  Goals of care: Discussed today w/ his daughter Adam Rios over the phone 11/25/23 4:47 PM. Discussed that he is still not really moving bowels much, he is uncomfortable, not eating well, more confused/agitated. She shares that he has declined in his cognitive and functional status over the past few months.  Discussed that plan for now would be to continue treatment as we are doing and hopefully he will improve but even if he does this issue will likely recur and he will continue to decline especially if he is not meeting his nutritional needs. If he declines further while here and/or if he is not eating much through the weekend would strongly consider comfort measures vs full scope treatment. She is agreeable to this plan for now.  Palliative consult to discuss further, daughter is open to discussions about possibly avoiding hospital, consideration for hospice, etc    Hypokalemia Replace as needed Monitor BMP Pt has been refusing K po supplementation   Dementia without behavioral disturbance  hold sertraline    Iron  deficiency anemia due to chronic blood loss Hemoglobin stable relative to baseline, no bleeding reported Monitor CBC   Chronic  indwelling suprapubic catheter secondary to chronic neurogenic bladder Exchanged 11/25/23 (clogged and unable to flush) Continue Vesicare, finasteride  Hold Flomax , Myrbetriq  for now in light of reduced colonic transit   Leukocytosis: resolved likely secondary to lieus, reactive will trend   Chronic diastolic CHF (congestive heart failure)  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 Significantly low or high BMI is associated with higher medical risk.  Underweight - under 18  overweight - 25 to 29 obese - 30 or more Class 1 obesity: BMI of 30.0 to 34 Class 2 obesity: BMI of 35.0 to 39 Class 3 obesity: BMI of 40.0 to 49 Super Morbid Obesity: BMI 50-59 Super-super Morbid Obesity: BMI 60+ Healthy nutrition and physical activity advised as adjunct to other disease management and risk reduction treatments    DVT prophylaxis: lovenox  IV fluids: D5NS continuous IV fluids  Nutrition: clears advance per gen surg / GI  Central lines / other devices: rectal tube, chronic suprapubic catheter   Code Status: DNR ACP documentation reviewed: none on file in [facility]  TOC needs: TBD Medical barriers to dispo: still not demonstrating adequate bowel function / po tolerance. Expected medical readiness for discharge next 2-3 days .              Subjective / Brief ROS:  Patient reports pain in lower abdomen (prior to cath exchange) Denies CP/SOB.  No answering questions otherwise, stares at the TV   Family Communication: see above re: conversation w/ daughter     Objective Findings:  Vitals:   11/24/23 2004 11/25/23 0521 11/25/23 0804 11/25/23 1514  BP: 131/78 129/79 138/88 (!) 141/81  Pulse: 72 (!) 40 (!) 103 81  Resp: 18 16 15 20   Temp: 98.1 F (36.7 C) 97.9 F (36.6 C) 97.8 F (36.6 C) 98.1 F (36.7 C)  TempSrc: Oral  Oral Oral  SpO2: 95% 97% 95% 97%    Intake/Output Summary (Last 24 hours) at 11/25/2023 1650 Last data filed at 11/25/2023 1515 Gross per 24 hour  Intake 865.5 ml  Output 1600 ml  Net -734.5 ml   There were no vitals  filed for this visit.  Examination:  Physical Exam Constitutional:      General: He is not in acute distress.  Cardiovascular:     Rate and Rhythm: Normal rate and regular rhythm.  Pulmonary:     Effort: Pulmonary effort is normal.     Breath sounds: Normal breath sounds.  Abdominal:     General: Bowel sounds are increased.     Palpations: Abdomen is soft. There is no fluid wave.     Tenderness: There is generalized abdominal tenderness. There is no guarding or rebound.   Skin:    General: Skin is warm and dry.   Neurological:     Mental Status: He is alert.   Psychiatric:        Mood and Affect: Mood normal.        Behavior: Behavior normal.          Scheduled Medications:   Chlorhexidine  Gluconate Cloth  6 each Topical Daily   enoxaparin  (LOVENOX ) injection  40 mg Subcutaneous Q24H   fesoterodine   4 mg Oral  Daily   finasteride   5 mg Oral Daily    Continuous Infusions:    PRN Medications:  acetaminophen  **OR** acetaminophen , dextrose , LORazepam , ondansetron  **OR** ondansetron  (ZOFRAN ) IV  Antimicrobials from admission:  Anti-infectives (From admission, onward)    None           Data Reviewed:  I have personally reviewed the following...  CBC: Recent Labs  Lab 11/21/23 1730 11/22/23 0131 11/23/23 0451 11/24/23 0410  WBC 9.9 14.6* 10.0 9.4  NEUTROABS 6.8  --   --   --   HGB 11.7* 12.2* 10.9* 10.7*  HCT 37.1* 40.1 36.1* 34.8*  MCV 81.2 82.5 83.6 82.3  PLT 268 275 236 230   Basic Metabolic Panel: Recent Labs  Lab 11/21/23 1730 11/22/23 0131 11/23/23 0451 11/24/23 0410  NA 136 137 140 136  K 2.7* 3.1* 3.6 3.7  CL 106 106 111 108  CO2 21* 23 24 22   GLUCOSE 102* 119* 96 69*  BUN 14 14 11 9   CREATININE 0.70 0.75 0.66 0.52*  CALCIUM  8.3* 8.4* 8.2* 8.1*  MG 2.1 2.0 1.9 1.7   GFR: CrCl cannot be calculated (Unknown ideal weight.). Liver Function Tests: Recent Labs  Lab 11/21/23 1730  AST 13*  ALT 11  ALKPHOS 122  BILITOT 0.8   PROT 6.4*  ALBUMIN 2.8*   Recent Labs  Lab 11/21/23 1730  LIPASE 22   No results for input(s): AMMONIA in the last 168 hours. Coagulation Profile: No results for input(s): INR, PROTIME in the last 168 hours. Cardiac Enzymes: No results for input(s): CKTOTAL, CKMB, CKMBINDEX, TROPONINI in the last 168 hours. BNP (last 3 results) No results for input(s): PROBNP in the last 8760 hours. HbA1C: No results for input(s): HGBA1C in the last 72 hours. CBG: Recent Labs  Lab 11/24/23 0744 11/24/23 0917  GLUCAP 67* 75   Lipid Profile: No results for input(s): CHOL, HDL, LDLCALC, TRIG, CHOLHDL, LDLDIRECT in the last 72 hours. Thyroid  Function Tests: No results for input(s): TSH, T4TOTAL, FREET4, T3FREE, THYROIDAB in the last 72 hours. Anemia Panel: No results for input(s): VITAMINB12, FOLATE, FERRITIN, TIBC, IRON , RETICCTPCT in the last 72 hours. Most Recent Urinalysis On File:     Component Value Date/Time   COLORURINE YELLOW (A) 09/15/2023 1821   APPEARANCEUR CLOUDY (A) 09/15/2023 1821   LABSPEC 1.026 09/15/2023 1821   PHURINE 5.0 09/15/2023 1821   GLUCOSEU NEGATIVE 09/15/2023 1821   HGBUR SMALL (A) 09/15/2023 1821   BILIRUBINUR NEGATIVE 09/15/2023 1821   KETONESUR NEGATIVE 09/15/2023 1821   PROTEINUR 100 (A) 09/15/2023 1821   NITRITE POSITIVE (A) 09/15/2023 1821   LEUKOCYTESUR LARGE (A) 09/15/2023 1821   Sepsis Labs: @LABRCNTIP (procalcitonin:4,lacticidven:4) Microbiology: No results found for this or any previous visit (from the past 240 hours).    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 No pneumoperitoneum. No organomegaly or radiopaque calculi. Diffuse aortoiliac atherosclerosis.  Left hip arthroplasty appears anatomically aligned. Diffuse osteopenia.Multilevel degenerative disc disease of the spine. IMPRESSION: Persistent gaseous distension of the colon, overall slightly decreased in the interim. Electronically Signed   By: Rogelia Myers M.D.   On: 11/24/2023 11:55   DG Abd Portable 1V Result Date: 11/22/2023 EXAM: 1 VIEW XRAY OF THE ABDOMEN SUPINE 11/22/2023 06:26:39 AM COMPARISON: CT of the abdomen and pelvis  with contrast 11/21/2023. CLINICAL HISTORY: Acute pseudo-obstruction of colon FINDINGS: BOWEL: Diffuse colonic distention remains. No free air is present. PERITONEUM AND SOFT TISSUES: No abnormal calcifications. BONES: Left hip hemiarthroplasty is noted. Atherosclerotic changes are present. No acute osseous abnormality. IMPRESSION: 1. Diffuse colonic distention remains. 2. No free air. Electronically signed by: Lonni Necessary MD 11/22/2023 06:49 AM EDT RP Workstation: [id number]   CT ABDOMEN PELVIS W CONTRAST Result Date: 11/21/2023 CLINICAL DATA:  Abdominal distension. Abdominal pain. No bowel movement several weeks. EXAM: CT ABDOMEN AND PELVIS WITH CONTRAST TECHNIQUE: Multidetector CT imaging of the abdomen and pelvis was performed using the standard protocol following bolus administration of intravenous contrast. RADIATION DOSE REDUCTION: This exam was performed according to the departmental dose-optimization program which includes automated exposure control, adjustment of the mA and/or kV according to patient size and/or use of iterative reconstruction technique. CONTRAST:  OMNIPAQUE  IOHEXOL  300 MG/ML  SOLN COMPARISON:  CT 08/31/2023 insert FINDINGS: Lower chest: Lung bases are clear. Hepatobiliary: No focal hepatic lesion. Normal gallbladder. No biliary duct dilatation. Common bile duct is normal. Pancreas: Pancreas is normal. No ductal dilatation. No pancreatic inflammation. Spleen: Normal spleen Adrenals/urinary tract: Adrenal glands are normal. RIGHT kidney  is normal. LEFT kidney is absent. Suprapubic catheter within the bladder. The bladder is decompressed. Stomach/Bowel: Stomach is normal. Duodenum and small-bowel appear normal. Small bowel is decompressed. No small bowel dilatation evident. The terminal ileum is normal. Post appendectomy. There is gas within the cecum and transverse colon. The descending colon is stool filled. The sigmoid colon is redundant and gas filled. There is no evidence of twisting of the sigmoid colon to suggest volvulus pathophysiology there is frothy stool within the rectum. No intraperitoneal free air.  Pneumatosis intestinalis. Vascular/Lymphatic: Abdominal aorta is normal caliber. No periportal or retroperitoneal adenopathy. No pelvic adenopathy. Reproductive: Unremarkable Other: No free air or intraperitoneal free fluid Musculoskeletal: No aggressive osseous lesion. IMPRESSION: 1. The colon is gas distended throughout the entirety from the cecum to the rectum. The sigmoid colon is redundant and tortuous but no evidence of twisting to suggest volvulus. 2. No evidence of small-bowel obstruction. 3. Findings most suggestive of colonic ileus. 4. Suprapubic catheter in decompressed bladder. Electronically Signed   By: Jackquline Boxer M.D.   On: 11/21/2023 18:44         Adam Kronberg, DO [facility] 11/25/2023, 4:50 PM    Dictation software may have been used to generate the above note. Typos may occur and escape review in typed/dictated notes. Please contact Dr Marsa directly for clarity if needed.  Staff may message me via secure chat in Epic  but this may not receive an immediate response,  please page me for urgent matters!  If 7PM-7AM, please contact night coverage [email or web address]

## 2023-11-25 NOTE — Progress Notes (Signed)
 Suprapubic catheter exchanged with no issues. Immediate clear yellow urine flow. New catheter 16 fr with 10 mL balloon. After removal, old catheter end appeared open but old catheter very hard and unable to compress just after end.

## 2023-11-25 NOTE — Evaluation (Addendum)
 Clinical/Bedside Swallow Evaluation Patient Details  Name: Adam Rios. MRN: [id number] Date of Birth: 01/22/1941  Today's Date: 11/25/2023 Time: SLP Start Time (ACUTE ONLY): 1345 SLP Stop Time (ACUTE ONLY): 1445 SLP Time Calculation (min) (ACUTE ONLY): 60 min  Past Medical History:  Past Medical History:  Diagnosis Date   Cognitive impairment    Complication of anesthesia    Fentanyl  causes nausea   Hemorrhagic cerebrovascular accident (CVA) (HCC)    Hypertension    Neurogenic bladder    Renal cell carcinoma (HCC) 2005   Renal disorder    Stroke (HCC)     01/17/18, 12/21   Some weakness and cognitive decline   Past Surgical History:  Past Surgical History:  Procedure Laterality Date   APPENDECTOMY     CATARACT EXTRACTION W/PHACO Right 07/22/2020   Procedure: CATARACT EXTRACTION PHACO AND INTRAOCULAR LENS PLACEMENT (IOC) RIGHT;  Surgeon: Myrna Adine Anes, MD;  Location: [facility];  Service: Ophthalmology;  Laterality: Right;  4.77 0:44.2   CATARACT EXTRACTION W/PHACO Left 08/05/2020   Procedure: CATARACT EXTRACTION PHACO AND INTRAOCULAR LENS PLACEMENT (IOC) LEFT 10.73 01:13.8;  Surgeon: Myrna Adine Anes, MD;  Location: [facility];  Service: Ophthalmology;  Laterality: Left;  Use eye stretcher not chair   FLEXIBLE SIGMOIDOSCOPY N/A 09/02/2023   Procedure: KINGSTON SIDE;  Surgeon: Jinny Carmine, MD;  Location: [facility];  Service: Endoscopy;  Laterality: N/A;   HERNIA REPAIR     HIP ARTHROPLASTY Left 11/28/2022   Procedure: ARTHROPLASTY BIPOLAR HIP (HEMIARTHROPLASTY);  Surgeon: Tobie Priest, MD;  Location: [facility];  Service: Orthopedics;  Laterality: Left;   IR CATHETER TUBE CHANGE  06/15/2023   LUMBAR FUSION     NEPHRECTOMY Left 2005   TEMPORARY PACEMAKER N/A 05/03/2022   Procedure: TEMPORARY PACEMAKER;  Surgeon: Florencio Cara BIRCH, MD;  Location: [facility];  Service: Cardiovascular;  Laterality: N/A;   HPI:  Pt is an 83 y.o. 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  Evidently developed constipation again several weeks ago.  Then in the last few days, his belly became distended, painful.  An x-ray at his facility two days ago showed volvulus reportedly.  He was having jelly like stools.  So he was sent to the ER.  Surgery has been following for Colonic Pseudoobstruciton; patient has Ogilvie syndrome. Per Surgery, pt has responded to rectal tube decompression; will f/u w/ GI per note.   Per CT Abd, Lower chest: Lung bases are clear.    Assessment / Plan / Recommendation  Clinical Impression   Pt seen for BSE. Pt was awake, verbal w/ some confabulations of speech. Pt has a Baseline of Dementia. He followed 1step instructions w/ heavy cue. Slow motor movements noted during holding cup to drink. Easily distracted.  On RA, afebrile. WBC WNL.   Pt appears to present w/ suspected min pharyngeal phase dysphagia in setting of declined Cognitive status, Baseline Dementia and now acute illness/hospitalization. Oral phase appeared [facility] w/ limited po consistencies this session. ANY Cognitive decline can impact overall awareness/timing of swallow and safety during po tasks which increases risk for aspiration, choking. Pt's risk for aspiration can be reduced when following general aspiration precautions and using a modified diet consistency (missing Dentition baseline). He required min-mod verbal/visual cues and support w/ feeding during po tasks.  Pt consumed several trials of ice chips, thin liquids via cup/straw, purees. Mild throat clearing noted x2 w/ thin liquids -- suspect pt taking larger sips. W/ strict instruction for SMALL, SINGLE SIPS SLOWLY, no further overt throat clearing/coughing noted during intake. No overt clinical s/s of aspiration noted w/ purees.  Overall, no decline in vocal quality, no cough, and no decline in respiratory status during/post trials. Oral phase was adequate for bolus management and oral clearing of the boluses given. No solids assessed this session. Pt attempted self-feeding but was only able to hold Cup effectively to drink. Noted ease of distractions requiring verbal cue to attend to task necessary-- suspect impact from the Cognitive decline.  OM Exam appeared grossly [facility] w/ No unilateral weakness noted during speech, oral clearing. Some confusion of OM tasks and oral care noted.          In setting of baseline Dementia and Dentition status, recommend initiation of the dysphagia level 1(pureed foods) moistened for ease of oral phase/flavor; thin liquids- pt to Hold Cup to drink. Recommend general aspiration precautions; SINGLE SMALL SIPS SLOWLY; monitor straw use; reduce Distractions during meals and engage pt during meals for self-feeding. Tray setup and support/Supervision. Pills Crushed in Puree for safer swallowing as needed. Supervision at meals. MD/NSG updated.  ST services will f/u w/ toleration of diet and trials to upgrade as appropriate/safe for pt. ST recommends follow w/ Palliative Care for GOC and education re: impact of Cognitive decline/Dementia on swallowing and general oral intake overall. Precautions posted in room, chart. SLP Visit Diagnosis: Dysphagia, pharyngeal phase (R13.13) (baseline Dementia)    Aspiration Risk  Mild aspiration risk;Risk for inadequate nutrition/hydration    Diet Recommendation   Thin;Dysphagia 1 (puree) = the dysphagia level 1(pureed foods) moistened for ease of oral phase/flavor; thin liquids- pt to Hold Cup to drink. Recommend general aspiration precautions including SINGLE SMALL SIPS SLOWLY; monitor straw use; reduce Distractions during meals and engage pt during meals for self-feeding. Tray setup and support/Supervision at meals.  Medication Administration: Crushed with puree     Other  Recommendations Recommended Consults:  (Dietician; Palliative Care) Oral Care Recommendations: Oral care BID;Oral care before and after PO;Staff/trained caregiver to provide oral care     Assistance Recommended at Discharge  FULL  Functional Status Assessment Patient has had a recent decline in their functional status and/or demonstrates limited ability to make significant improvements in function in a reasonable and predictable amount of time  Frequency and Duration min 2x/week  2 weeks       Prognosis Prognosis for improved oropharyngeal function: Fair Barriers to Reach Goals: Cognitive deficits;Language deficits;Time post onset;Severity of deficits Barriers/Prognosis Comment: baseline Dementia      Swallow Study   General Date of Onset: 11/21/23 HPI: Pt is an 83 y.o. 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  Evidently developed constipation again several weeks ago.  Then in the last few days, his belly became distended, painful.  An x-ray at his facility two days ago showed volvulus reportedly.  He was having jelly like stools.  So he was sent to the ER.  Surgery has been following for Colonic Pseudoobstruciton; patient has Ogilvie syndrome. Per Surgery, pt has responded to rectal tube decompression; will f/u w/ GI per note.   Per CT Abd, Lower chest: Lung bases are clear. Type  of Study: Bedside Swallow Evaluation Previous Swallow Assessment: 2022- mech soft, thins Diet Prior to this Study: NPO Temperature Spikes Noted: No (wbc 9.4) Respiratory Status: Room air History of Recent Intubation: No Behavior/Cognition: Alert;Cooperative;Pleasant mood;Confused;Distractible;Requires cueing (baseline Dementia) Oral Cavity Assessment: Dry Oral Care Completed by SLP: Yes Oral Cavity - Dentition: Missing  dentition Vision: Functional for self-feeding Self-Feeding Abilities: Able to feed self;Needs assist;Needs set up;Total assist Patient Positioning: Upright in bed (full support) Baseline Vocal Quality: Normal Volitional Cough: Strong    Oral/Motor/Sensory Function Overall Oral Motor/Sensory Function: Within functional limits (no unilateral oral weakness)   Ice Chips Ice chips: Within functional limits Presentation: Spoon (fed; 3 trials)   Thin Liquid Thin Liquid: Impaired Presentation: Cup;Self Fed;Straw (5 trials; 8 trials via straw) Oral Phase Impairments: Poor awareness of bolus Oral Phase Functional Implications:  (anterior leakage 1x during feeding) Pharyngeal  Phase Impairments: Suspected delayed Swallow;Throat Clearing - Immediate (x2) Other Comments: ssupect pt had taken larger sips    Nectar Thick Nectar Thick Liquid: Not tested   Honey Thick Honey Thick Liquid: Not tested   Puree Puree: Within functional limits Presentation: Spoon (fed; 12 trials)   Solid     Solid: Not tested        Comer Portugal, MS, CCC-SLP Speech Language Pathologist Rehab Services; [facility] [phone] (ascom) Nilam Quakenbush 11/25/2023,3:51 PM

## 2023-11-25 NOTE — Plan of Care (Signed)
  Problem: Clinical Measurements: Goal: Diagnostic test results will improve Outcome: Progressing   Problem: Coping: Goal: Level of anxiety will decrease Outcome: Progressing   Problem: Safety: Goal: Ability to remain free from injury will improve Outcome: Progressing   Problem: Safety: Goal: Ability to remain free from injury will improve Outcome: Progressing

## 2023-11-25 NOTE — Progress Notes (Signed)
 CC: Colonic Pseudoobstruciton  Subjective: Patient reports feeling better today.  He continues to have stool out of his rectal tubes.  Objective: Vital signs in last 24 hours: Temp:  [97.8 F (36.6 C)-98.2 F (36.8 C)] 97.8 F (36.6 C) (06/26 0804) Pulse Rate:  [40-103] 103 (06/26 0804) Resp:  [15-18] 15 (06/26 0804) BP: (129-140)/(78-88) 138/88 (06/26 0804) SpO2:  [95 %-97 %] 95 % (06/26 0804) Last BM Date : 11/24/23  Intake/Output from previous day: 06/25 0701 - 06/26 0700 In: 1392.6 [I.V.:1392.6] Out: 1100 [Urine:800; Stool:300] Intake/Output this shift: No intake/output data recorded.  Physical exam: Abdomen much less distended.  Soft, nontympanic, no rebound tenderness.  Midline surgical scar well-healed.  Rectal tube in place with stool in it.  Lab Results: CBC  Recent Labs    11/23/23 0451 11/24/23 0410  WBC 10.0 9.4  HGB 10.9* 10.7*  HCT 36.1* 34.8*  PLT 236 230   BMET Recent Labs    11/23/23 0451 11/24/23 0410  NA 140 136  K 3.6 3.7  CL 111 108  CO2 24 22  GLUCOSE 96 69*  BUN 11 9  CREATININE 0.66 0.52*  CALCIUM  8.2* 8.1*   PT/INR No results for input(s): LABPROT, INR in the last 72 hours. ABG No results for input(s): PHART, HCO3 in the last 72 hours.  Invalid input(s): PCO2, PO2  Studies/Results: DG Abd 2 Views Result Date: 11/24/2023 CLINICAL DATA:  [id number] Larkin syndrome [id number] EXAM: ABDOMEN - 2 VIEW COMPARISON:  November 22, 2023, November 21, 2023 FINDINGS: Persistent gaseous distention of the colon, overall slightly decreased in the interim. No pneumoperitoneum. No organomegaly or radiopaque calculi. Diffuse aortoiliac atherosclerosis. Left hip arthroplasty appears anatomically aligned. Diffuse osteopenia.Multilevel degenerative disc disease of the spine. IMPRESSION: Persistent gaseous distension of the colon, overall slightly decreased in the interim. Electronically Signed   By: Rogelia Myers M.D.   On: 11/24/2023 11:55     Anti-infectives: Anti-infectives (From admission, onward)    None       Assessment/Plan:  Patient with Ogilvie syndrome.  He has responded to rectal tube decompression.  His abdomen is less distended.  No x-ray today but do not think he needs one as his clinical course has improved.  Will defer to GI for final management as no surgical intervention needed at this time.  Please continue to ensure that his electrolytes are corrected to appropriate levels.  Surgical team to sign off please call with any questions or concerns.  Jayson Endow, M.D. [facility]

## 2023-11-25 NOTE — TOC Progression Note (Signed)
 Transition of Care (TOC) - Progression Note    Patient Details  Name: Adam Rios. MRN: [id number] Date of Birth: Sep 12, 1940  Transition of Care [facility]) CM/SW Contact  Quintella Suzen Jansky, RN Phone Number: 11/25/2023, 7:47 AM  Clinical Narrative:     Rectal tube in place for ileus. Palliative consult. Patient LTC at [facility]. He has PACE CM, Robin. TOC will continue to follow for discharge needs.       Expected Discharge Plan and Services                                               Social Determinants of Health (SDOH) Interventions SDOH Screenings   Food Insecurity: No Food Insecurity (11/21/2023)  Housing: Low Risk  (11/21/2023)  Transportation Needs: No Transportation Needs (11/21/2023)  Utilities: Not At Risk (11/21/2023)  Financial Resource Strain: Low Risk  (05/04/2022)   Received from [facility]  Physical Activity: Inactive (03/29/2018)   Received from [facility]  Social Connections: Unknown (11/22/2023)  Recent Concern: Social Connections - Socially Isolated (09/01/2023)  Stress: No Stress Concern Present (03/29/2018)   Received from [facility]  Tobacco Use: Medium Risk (11/21/2023)  Health Literacy: Low Risk  (05/24/2023)   Received from [facility]    Readmission Risk Interventions    08/11/2022    4:53 PM 06/11/2022    2:19 PM 02/12/2022    2:17 PM  Readmission Risk Prevention Plan  Transportation Screening Complete Complete Complete  PCP or Specialist Appt within 3-5 Days  Complete Complete  HRI or Home Care Consult  Complete Complete  Social Work Consult for Recovery Care Planning/Counseling  Complete Complete  Palliative Care Screening  Not Applicable Not Applicable  Medication Review Oceanographer) Complete Complete Referral to Pharmacy  [facility] or Home Care Consult Complete    Skilled Nursing Facility Complete

## 2023-11-26 DIAGNOSIS — K5981 Ogilvie syndrome: Secondary | ICD-10-CM | POA: Diagnosis not present

## 2023-11-26 DIAGNOSIS — R9431 Abnormal electrocardiogram [ECG] [EKG]: Secondary | ICD-10-CM | POA: Insufficient documentation

## 2023-11-26 DIAGNOSIS — E876 Hypokalemia: Secondary | ICD-10-CM | POA: Diagnosis not present

## 2023-11-26 DIAGNOSIS — Z515 Encounter for palliative care: Secondary | ICD-10-CM | POA: Diagnosis not present

## 2023-11-26 DIAGNOSIS — F0390 Unspecified dementia without behavioral disturbance: Secondary | ICD-10-CM | POA: Diagnosis not present

## 2023-11-26 LAB — MRSA NEXT GEN BY PCR, NASAL: MRSA by PCR Next Gen: NOT DETECTED

## 2023-11-26 LAB — BASIC METABOLIC PANEL WITH GFR
Anion gap: 8 (ref 5–15)
BUN: 8 mg/dL (ref 8–23)
CO2: 24 mmol/L (ref 22–32)
Calcium: 8.4 mg/dL — ABNORMAL LOW (ref 8.9–10.3)
Chloride: 108 mmol/L (ref 98–111)
Creatinine, Ser: 0.56 mg/dL — ABNORMAL LOW (ref 0.61–1.24)
GFR, Estimated: >60 mL/min (ref >60)
Glucose, Bld: 94 mg/dL (ref 70–99)
Potassium: 3.1 mmol/L — ABNORMAL LOW (ref 3.5–5.1)
Sodium: 140 mmol/L (ref 135–145)

## 2023-11-26 LAB — CBC
HCT: 34.7 % — ABNORMAL LOW (ref 39.0–52.0)
Hemoglobin: 11 g/dL — ABNORMAL LOW (ref 13.0–17.0)
MCH: 25.6 pg — ABNORMAL LOW (ref 26.0–34.0)
MCHC: 31.7 g/dL (ref 30.0–36.0)
MCV: 80.9 fL (ref 80.0–100.0)
Platelets: 220 10*3/uL (ref 150–400)
RBC: 4.29 MIL/uL (ref 4.22–5.81)
RDW: 16.4 % — ABNORMAL HIGH (ref 11.5–15.5)
WBC: 9 10*3/uL (ref 4.0–10.5)
nRBC: 0 % (ref 0.0–0.2)

## 2023-11-26 LAB — GLUCOSE, CAPILLARY: Glucose-Capillary: 104 mg/dL — ABNORMAL HIGH (ref 70–99)

## 2023-11-26 MED ORDER — SODIUM CHLORIDE 0.9 % IV SOLN: INTRAVENOUS | Status: AC

## 2023-11-26 MED ORDER — POTASSIUM CHLORIDE 20 MEQ PO PACK
40.0000 meq | PACK | Freq: Two times a day (BID) | ORAL | Status: DC
  Administered 2023-11-26: 40 meq via ORAL
  Filled 2023-11-26 (×2): qty 2

## 2023-11-26 NOTE — Plan of Care (Signed)
  Problem: Clinical Measurements: Goal: Diagnostic test results will improve Outcome: Progressing   Problem: Nutrition: Goal: Adequate nutrition will be maintained Outcome: Progressing   Problem: Elimination: Goal: Will not experience complications related to bowel motility Outcome: Progressing   Problem: Pain Managment: Goal: General experience of comfort will improve and/or be controlled Outcome: Progressing   Problem: Activity: Goal: Risk for activity intolerance will decrease Outcome: Progressing   Problem: Pain Managment: Goal: General experience of comfort will improve and/or be controlled Outcome: Progressing

## 2023-11-26 NOTE — Consult Note (Signed)
 Consultation Note Date: 11/26/2023   Patient Name: Adam Rios.  DOB: 12-25-1940  MRN: [id number]  Age / Sex: 83 y.o., male  PCP: [employer] Referring Physician: Marsa Edelman, DO  Reason for Consultation: Establishing goals of care   HPI/Brief Hospital Course: 83 y.o. male  with past medical history of dementia, CHF, hypertension, history of remote CVA, renal cell carcinoma status post left nephrectomy in 2005, neurogenic bladder with chronic indwelling catheter, history of second-degree AV block status post PPM and history of ileus in April admitted from [facility] on 11/21/2023 with abdominal pain and distention.  CT abdomen obtained on arrival to ED which revealed ileus-GI and general surgery consulted, suspected mild colonic pseudoobstruction with recommendations to manage conservatively-rectal tube placed for decompression, possibly due to electrolyte imbalances Per review of record over the last several days [name] has become more somnolent and confused with minimal p.o. intake  Conversations had earlier this a.m. with primary team and nursing staff regarding [name] being less responsive with mild tachycardia  Palliative medicine was consulted for assisting with goals of care conversations.  Subjective:  Extensive chart review has been completed prior to meeting patient including labs, vital signs, imaging, progress notes, orders, and available advanced directive documents from current and previous encounters.  Visited with Mr. Falwell at his bedside.  He is resting in bed with eyes closed.  He attempts to open eyes to calling of his name but quickly drifts back to sleep without constant redirection.  He is able to answer simple yes or no questions appropriately.  He denies acute pain or discomfort for distress.  No family or visitors at bedside during time of visit.  In discussions with primary team, concern for  progression of underlying dementia.  Conversations had with daughter and primary team yesterday for which daughter agreed to continue with current plan of care but made aware of the possibility of continued decline and difficulty for him to meet his nutritional needs, consideration of comfort care to be had over the next few days based on his progression  Have returned to bedside several times throughout shift, no visitors present.  Attempted to call daughter-[name] without success, HIPAA compliant voicemail left requesting return call.  Received return call from daughter-[name].  Introduced myself as a Publishing rights manager as a member of the palliative care team. Explained palliative medicine is specialized medical care for people living with serious illness. It focuses on providing relief from the symptoms and stress of a serious illness. The goal is to improve quality of life for both the patient and the family.   [name] shares her understanding of current medical condition and conversations had since admission with primary team.  [name] shares she is aware of her father's prognosis remains poor and his intake has declined and remain minimal.  She is also aware current condition to likely recur.  [name] also shares that she has noticed a significant cognitive and functional decline in her father over the last several months.  We discussed patient's current illness and what it means in the larger context of patient's on-going co-morbidities. Natural disease trajectory and expectations at EOL were discussed.   The difference between aggressive medical intervention and comfort care was discussed.  [name] shares she and her son are out of town through the weekend and wished to continue with current plan of care until they are able to visit bedside likely early Monday morning prior to consideration of transitioning to comfort  care.  [name] inquires about prognosis if decision made to transition to  comfort/hospice care, shared generalized prognosis of 6 to 8 weeks but could be shortened if complications continue or if decline progresses more rapid.  Shared with [name] in the event of a significant decline or [name] exhibiting signs or symptoms of discomfort or distress would strongly recommend allowing management of symptoms and consideration of transition to CMO at that time for which she verbalized understanding and agreement.  All questions/concerns addressed. PMT will continue to follow and support patient as needed.  Objective: Primary Diagnoses: Present on Admission:  Acute pseudo-obstruction of colon  Chronic diastolic CHF (congestive heart failure) (HCC)  Iron  deficiency anemia due to chronic blood loss  Essential hypertension  Hypokalemia  Neurogenic bladder   Physical Exam Constitutional:      General: He is not in acute distress.    Appearance: He is ill-appearing.  Pulmonary:     Effort: Pulmonary effort is normal. No respiratory distress.  Abdominal:     General: There is distension.     Tenderness: There is no abdominal tenderness.   Skin:    General: Skin is warm and dry.   Neurological:     Mental Status: He is lethargic.     Motor: Weakness present.     Comments: Does not follow commands, oriented to person    Vital Signs: BP (!) 138/91 (BP Location: Right Arm) Comment: Notified nurse  Pulse (!) 105 Comment: notified nurse  Temp 98.1 F (36.7 C) (Oral)   Resp 16   SpO2 94%  Pain Scale: Faces POSS *See Group Information*: 1-Acceptable,Awake and alert Pain Score: 0-No pain   IO: Intake/output summary:  Intake/Output Summary (Last 24 hours) at 11/26/2023 1429 Last data filed at 11/26/2023 0600 Gross per 24 hour  Intake --  Output 850 ml  Net -850 ml    LBM: Last BM Date : 11/25/23 Baseline Weight:   Most recent weight:        Assessment and Plan  SUMMARY OF RECOMMENDATIONS   Continue with current plan of care Consideration of  transition to CMO if significant decline for signs/symptoms of acute discomfort or distress PMT to continue to follow through the weekend and meet with family at bedside Monday a.m. when they return from out of town  Palliative Prophylaxis:   Bowel Regimen, Delirium Protocol and Frequent Pain Assessment  Discussed With: Primary team and nursing staff   Thank you for this consult and allowing Palliative Medicine to participate in the care of Sherwood PHEBE Fermin Mickey. Gene. Palliative medicine will continue to follow and assist as needed.   Time Total: 75 minutes  Time spent includes: Detailed review of medical records (labs, imaging, vital signs), medically appropriate exam (mental status, respiratory, cardiac, skin), discussed with treatment team, counseling and educating patient, family and staff, documenting clinical information, medication management and coordination of care.   Signed by: Waddell Lesches, DNP, AGNP-C Palliative Medicine    Please contact Palliative Medicine Team phone at [phone] for questions and concerns.  For individual provider: See Tracey

## 2023-11-26 NOTE — Plan of Care (Signed)

## 2023-11-26 NOTE — Progress Notes (Signed)
 SLP Cancellation Note  Patient Details Name: Terik Haughey. MRN: [id number] DOB: 01/22/1941   Cancelled treatment:       Reason Eval/Treat Not Completed: Fatigue/lethargy limiting ability to participate;Patient declined, no reason specified (chart reviewed; consulted NSG and attempted tx session w/ pt.)  Per chart and NSG, no overt s/s of aspiration have been noted w/ oral intake. Pt is currently on a Dys level 1 diet (puree) w/ thin liquids. Per MD note, pt has become gradually more somnolent and confused over past couple days; not eating much po's prior to admit/during this admit.  When seeing pt today, pt appeared drowsy and only opened eyes to MOD verbal/tactile stim; MD noted same in note. Pt was asked/offered different po's including thin liquids, pt declined stating no often and turned head away.  Per MD note, Palliative Care is ordered/following and if not eating through the weekend will need to consider Hospice.   ST services recommends continuing w/ current diet consistency as least restrictive and safe diet consistency at this time. ST will monitor chart notes and GOC as determined by pt/Family and Palliative Care; will f/u on Monday.  Recommend aspiration precautions and feeding Supervision at all meals. Give po's only when awake/alert and engaged.      Comer Portugal, MS, CCC-SLP Speech Language Pathologist Rehab Services; [facility] [phone] (ascom) Waylin Dorko 11/26/2023, 4:12 PM

## 2023-11-26 NOTE — TOC Progression Note (Signed)
 Transition of Care (TOC) - Progression Note    Patient Details  Name: Adam Rios. MRN: [id number] Date of Birth: November 12, 1940  Transition of Care [facility]) CM/SW Contact  Quintella Suzen Jansky, RN Phone Number: 11/26/2023, 3:22 PM  Clinical Narrative:     Patient pending Palliative consult. Patient from [facility].        Expected Discharge Plan and Services                                               Social Determinants of Health (SDOH) Interventions SDOH Screenings   Food Insecurity: No Food Insecurity (11/21/2023)  Housing: Low Risk  (11/21/2023)  Transportation Needs: No Transportation Needs (11/21/2023)  Utilities: Not At Risk (11/21/2023)  Financial Resource Strain: Low Risk  (05/04/2022)   Received from [facility]  Physical Activity: Inactive (03/29/2018)   Received from [facility]  Social Connections: Unknown (11/22/2023)  Recent Concern: Social Connections - Socially Isolated (09/01/2023)  Stress: No Stress Concern Present (03/29/2018)   Received from [facility]  Tobacco Use: Medium Risk (11/21/2023)  Health Literacy: Low Risk  (05/24/2023)   Received from [facility]    Readmission Risk Interventions    08/11/2022    4:53 PM 06/11/2022    2:19 PM 02/12/2022    2:17 PM  Readmission Risk Prevention Plan  Transportation Screening Complete Complete Complete  PCP or Specialist Appt within 3-5 Days  Complete Complete  HRI or Home Care Consult  Complete Complete  Social Work Consult for Recovery Care Planning/Counseling  Complete Complete  Palliative Care Screening  Not Applicable Not Applicable  Medication Review Oceanographer) Complete Complete Referral to Pharmacy  [facility] or Home Care Consult Complete    Skilled Nursing Facility Complete

## 2023-11-26 NOTE — Progress Notes (Signed)
 Rogelia Copping, MD [facility]   [address], [address] [place], [place] [address] Phone: [phone] Fax : [phone]   Subjective: The patient was resting in bed today in no apparent distress.  His abdomen is still distended and tympanic but not any worse than it has been in the past.  The patient continues to have some drainage from his rectal tube.  I have seen that there is ongoing discussions with the patient's daughter about future care for the patient and that his pending to see how he does over the weekend.   Objective: Vital signs in last 24 hours: Vitals:   11/25/23 1514 11/25/23 1945 11/26/23 0337 11/26/23 0804  BP: (!) 141/81 138/80 136/76 (!) 138/91  Pulse: 81 79 81 (!) 105  Resp: 20 18 18 16   Temp: 98.1 F (36.7 C) 98.1 F (36.7 C) 98.7 F (37.1 C) 98.1 F (36.7 C)  TempSrc: Oral Oral  Oral  SpO2: 97% 97% 96% 94%   Weight change:   Intake/Output Summary (Last 24 hours) at 11/26/2023 1127 Last data filed at 11/26/2023 0600 Gross per 24 hour  Intake --  Output 850 ml  Net -850 ml     Exam: Heart:: Regular rate and rhythm or without murmur or extra heart sounds Lungs: normal and clear to auscultation and percussion Abdomen: Tympanic distended with positive bowel sounds   Lab Results: @LABTEST2 @ Micro Results: Recent Results (from the past 240 hours)  MRSA Next Gen by PCR, Nasal     Status: None   Collection Time: 11/26/23  7:55 AM   Specimen: Nasal Mucosa; Nasal Swab  Result Value Ref Range Status   MRSA by PCR Next Gen NOT DETECTED NOT DETECTED Final    Comment: (NOTE) The GeneXpert MRSA Assay (FDA approved for NASAL specimens only), is one component of a comprehensive MRSA colonization surveillance program. It is not intended to diagnose MRSA infection nor to guide or monitor treatment for MRSA infections. Test performance is not FDA approved in patients less than 5 years old. Performed at [facility], [address]  [place], [place] [address]    Studies/Results: No results found. Medications: I have reviewed the patient's current medications. Scheduled Meds:  Chlorhexidine  Gluconate Cloth  6 each Topical Daily   enoxaparin  (LOVENOX ) injection  40 mg Subcutaneous Q24H   fesoterodine   4 mg Oral Daily   finasteride   5 mg Oral Daily   polyethylene glycol  17 g Oral Daily   potassium chloride   40 mEq Oral BID   senna-docusate  2 tablet Oral BID   Continuous Infusions:  sodium chloride  125 mL/hr at 11/26/23 1028   PRN Meds:.acetaminophen  **OR** acetaminophen , dextrose , LORazepam , ondansetron  **OR** ondansetron  (ZOFRAN ) IV   Assessment: Principal Problem:   Acute pseudo-obstruction of colon Active Problems:   Essential hypertension   Neurogenic bladder   History of CVA (cerebrovascular accident)   Chronic diastolic CHF (congestive heart failure) (HCC)   Chronic indwelling Foley catheter   Iron  deficiency anemia due to chronic blood loss   Hypokalemia   Dementia without behavioral disturbance (HCC)   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  The patient is having some results with the rectal tube.  There is no role for any GI intervention at this time.  I will sign  off.  Please call if any further GI concerns or questions.  We would like to thank you for the opportunity to participate in the care of Wolfgang Finigan..    LOS: 5 days   Rogelia Copping, MD.FACG 11/26/2023, 11:27 AM Pager [phone] 7am-5pm  Check AMION for 5pm -7am coverage and on weekends

## 2023-11-26 NOTE — Progress Notes (Signed)
 PROGRESS NOTE    Adam Rios.   FMW:[id number] DOB: 01-01-1941  DOA: 11/21/2023 Date of Service: 11/26/23 which is hospital day 5  PCP: [facility], [facility] course / significant events:   HPI: 83 y.o. 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 Evidently developed constipation several weeks ago. Then in the last few days, his belly became distended, painful. An x-ray at his facility two days prior to ED showed volvulus reportedly. He was having jelly like stools. So he was sent to the ER.   06/22: to ED, CT showed ileus. EDP spoke with Dr. Marinda General Surgery and Dr. Therisa GI. The consensus is that it may be ileus or (given degree of distension on CT) mild colonic pseudoobstruction, will attempt to manage conservatively. Admitted to hospiatlist  06/23: Had BM Overnight per RN. Per gen surg - history and exam consistent with Ogilvie syndrome, recs rectal tube placement to decompress, question whether candidate for atropine, would defer to GI. Per GI - keep K over 4 and Mg over 2, agree rectal tube, consider decompression colonoscopy but defer for now given likelihood of recurrence will try to maintatin conservative tx 06/24: not much improvement, plan KUB tomorrow am 06/25: KUB showing minimal improvement  06/26: pt reports feeling improved, not eating much, gradually more somnolent and confused over past couple days, SLP to see. Suprapubic cath exchanged (clogged and unable to flush). D/w daughter - will monitor for now, if not eating through the weekend will need to consider hospice 06/27: Palliative to see. Pt sleepy this AM, but rouses to voice and answers some yes/no questions. Sinus tach, will trial IV fluids, pending palliative d/w family today      Consultants:  General  Surgery Gastroenterology Palliative Care   Procedures/Surgeries: none      ASSESSMENT & PLAN:   Ileus / Pseudoobstruction  Expect neostigmine will be contraindicated in this case given his cardiac and urinary history, and carry significant risk regardless given his age and functional status.  no evidence of bowel compromise on imaging nor examination at this time. At this point, he would require subtotal colectomy and end ileostomy creation which would carry significant morbidity and mortality in his situation  Advance diet as tolerated / as able but pt is reluctant to eat  Rectal tube per GI  Optimize electrolytes - keep K over 4 and Mg over 2 Ok for up w/ PT/OT Palliative consult - see GOC below   Hypoglycemia While NPO D5NS fluids D50 amp prn  Monitor CLG as needed / Glc w/ am labs  Goals of care: Discussed today w/ his daughter Adam Rios over the phone 11/25/23 4:47 PM. Discussed that he is still not really moving bowels much, he is uncomfortable, not eating well, more confused/agitated. She shares that he has declined in his cognitive and functional status over the past few months.  Discussed that plan for now would be to continue treatment as we are doing and hopefully he will improve but even if he does this issue will likely recur and he will continue to decline especially if he is not meeting his nutritional needs. If he declines further while here and/or if he is not eating much through the weekend would strongly consider comfort measures vs full scope treatment. She is agreeable  to this plan for now.  Palliative consult to discuss further, daughter is open to discussions about possibly avoiding hospital, consideration for hospice, etc    Hypokalemia Replace as needed Monitor BMP Pt has been refusing K po supplementation   Dementia without behavioral disturbance  hold sertraline    Sinus tach Long QT VSS otherwise Trial fluids   Iron  deficiency anemia due to  chronic blood loss Hemoglobin stable relative to baseline, no bleeding reported Monitor CBC   Chronic indwelling suprapubic catheter secondary to chronic neurogenic bladder Exchanged 11/25/23 (clogged and unable to flush) Continue Vesicare, finasteride  Hold Flomax , Myrbetriq  for now in light of reduced colonic transit   Leukocytosis: resolved likely secondary to lieus, reactive will trend   Chronic diastolic CHF (congestive heart failure)  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 Significantly low or high BMI is associated with higher medical risk.  Underweight - under 18  overweight - 25 to 29 obese - 30 or more Class 1 obesity: BMI of 30.0 to 34 Class 2 obesity: BMI of 35.0 to 39 Class 3 obesity: BMI of 40.0 to 49 Super Morbid Obesity: BMI 50-59 Super-super Morbid Obesity: BMI 60+ Healthy nutrition and physical activity advised as adjunct to other disease management and risk reduction treatments    DVT prophylaxis: lovenox  IV fluids: NS 1L today  Nutrition: clears advance per gen surg / GI  Central lines / other devices: rectal tube, chronic suprapubic catheter   Code Status: DNR ACP documentation reviewed: none on file in [facility]  TOC needs: TBD Medical barriers to dispo: still not demonstrating adequate bowel function / po tolerance. Expected medical readiness for discharge next 2-3 days .              Subjective / Brief ROS:  Patient sleepy but alerts to voice, not contributing any detailed answers but denies pain at this time    Family Communication: see above re: conversation w/ daughter - will revisit pending d/w palliative     Objective  Findings:  Vitals:   11/25/23 1514 11/25/23 1945 11/26/23 0337 11/26/23 0804  BP: (!) 141/81 138/80 136/76 (!) 138/91  Pulse: 81 79 81 (!) 105  Resp: 20 18 18 16   Temp: 98.1 F (36.7 C) 98.1 F (36.7 C) 98.7 F (37.1 C) 98.1 F (36.7 C)  TempSrc: Oral Oral  Oral  SpO2: 97% 97% 96% 94%    Intake/Output Summary (Last 24 hours) at 11/26/2023 1243 Last data filed at 11/26/2023 0600 Gross per 24 hour  Intake --  Output 850 ml  Net -850 ml   There were no vitals filed for this visit.  Examination:  Physical Exam Constitutional:      General: He is not in acute distress.    Appearance: He is ill-appearing.   Cardiovascular:     Rate and Rhythm: Normal rate and regular rhythm.  Pulmonary:     Effort: Pulmonary effort is normal.     Breath sounds: Normal breath sounds.  Abdominal:     General: Bowel sounds are increased.     Palpations: Abdomen is soft. There is no fluid wave.     Tenderness: There is generalized abdominal tenderness. There is  no guarding or rebound.   Skin:    General: Skin is warm and dry.   Psychiatric:        Mood and Affect: Mood normal.        Behavior: Behavior normal.          Scheduled Medications:   Chlorhexidine  Gluconate Cloth  6 each Topical Daily   enoxaparin  (LOVENOX ) injection  40 mg Subcutaneous Q24H   polyethylene glycol  17 g Oral Daily   potassium chloride   40 mEq Oral BID   senna-docusate  2 tablet Oral BID    Continuous Infusions:  sodium chloride  125 mL/hr at 11/26/23 1028     PRN Medications:  acetaminophen  **OR** acetaminophen , dextrose , LORazepam   Antimicrobials from admission:  Anti-infectives (From admission, onward)    None           Data Reviewed:  I have personally reviewed the following...  CBC: Recent Labs  Lab 11/21/23 1730 11/22/23 0131 11/23/23 0451 11/24/23 0410 11/26/23 0408  WBC 9.9 14.6* 10.0 9.4 9.0  NEUTROABS 6.8  --   --   --   --   HGB 11.7* 12.2* 10.9* 10.7* 11.0*  HCT  37.1* 40.1 36.1* 34.8* 34.7*  MCV 81.2 82.5 83.6 82.3 80.9  PLT 268 275 236 230 220   Basic Metabolic Panel: Recent Labs  Lab 11/21/23 1730 11/22/23 0131 11/23/23 0451 11/24/23 0410 11/26/23 0408  NA 136 137 140 136 140  K 2.7* 3.1* 3.6 3.7 3.1*  CL 106 106 111 108 108  CO2 21* 23 24 22 24   GLUCOSE 102* 119* 96 69* 94  BUN 14 14 11 9 8   CREATININE 0.70 0.75 0.66 0.52* 0.56*  CALCIUM  8.3* 8.4* 8.2* 8.1* 8.4*  MG 2.1 2.0 1.9 1.7  --    GFR: CrCl cannot be calculated (Unknown ideal weight.). Liver Function Tests: Recent Labs  Lab 11/21/23 1730  AST 13*  ALT 11  ALKPHOS 122  BILITOT 0.8  PROT 6.4*  ALBUMIN 2.8*   Recent Labs  Lab 11/21/23 1730  LIPASE 22   No results for input(s): AMMONIA in the last 168 hours. Coagulation Profile: No results for input(s): INR, PROTIME in the last 168 hours. Cardiac Enzymes: No results for input(s): CKTOTAL, CKMB, CKMBINDEX, TROPONINI in the last 168 hours. BNP (last 3 results) No results for input(s): PROBNP in the last 8760 hours. HbA1C: No results for input(s): HGBA1C in the last 72 hours. CBG: Recent Labs  Lab 11/24/23 0744 11/24/23 0917  GLUCAP 67* 75   Lipid Profile: No results for input(s): CHOL, HDL, LDLCALC, TRIG, CHOLHDL, LDLDIRECT in the last 72 hours. Thyroid  Function Tests: No results for input(s): TSH, T4TOTAL, FREET4, T3FREE, THYROIDAB in the last 72 hours. Anemia Panel: No results for input(s): VITAMINB12, FOLATE, FERRITIN, TIBC, IRON , RETICCTPCT in the last 72 hours. Most Recent Urinalysis On File:     Component Value Date/Time   COLORURINE YELLOW (A) 09/15/2023 1821   APPEARANCEUR CLOUDY (A) 09/15/2023 1821   LABSPEC 1.026 09/15/2023 1821   PHURINE 5.0 09/15/2023 1821   GLUCOSEU NEGATIVE 09/15/2023 1821   HGBUR SMALL (A) 09/15/2023 1821   BILIRUBINUR NEGATIVE 09/15/2023 1821   KETONESUR NEGATIVE 09/15/2023 1821   PROTEINUR 100 (A) 09/15/2023 1821    NITRITE POSITIVE (A) 09/15/2023 1821   LEUKOCYTESUR LARGE (A) 09/15/2023 1821   Sepsis Labs: @LABRCNTIP (procalcitonin:4,lacticidven:4) Microbiology: Recent Results (from the past 240 hours)  MRSA Next Gen by PCR, Nasal     Status: None   Collection Time:  11/26/23  7:55 AM   Specimen: Nasal Mucosa; Nasal Swab  Result Value Ref Range Status   MRSA by PCR Next Gen NOT DETECTED NOT DETECTED Final    Comment: (NOTE) The GeneXpert MRSA Assay (FDA approved for NASAL specimens only), is one component of a comprehensive MRSA colonization surveillance program. It is not intended to diagnose MRSA infection nor to guide or monitor treatment for MRSA infections. Test performance is not FDA approved in patients less than 54 years old. 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 No pneumoperitoneum. No organomegaly or radiopaque calculi. Diffuse aortoiliac atherosclerosis. Left hip arthroplasty appears anatomically aligned. Diffuse osteopenia.Multilevel degenerative disc disease of the spine. IMPRESSION: Persistent gaseous distension of the colon, overall slightly decreased in the interim. Electronically Signed   By: Rogelia Myers M.D.   On: 11/24/2023 11:55         Laneta Blunt, DO [facility] 11/26/2023, 12:43 PM    Dictation software may have been used to generate the above note. Typos may occur and escape review in typed/dictated notes. Please contact Dr Blunt directly for clarity if needed.  Staff may message me via secure chat in Epic  but this may not receive an immediate response,  please page me for urgent matters!  If 7PM-7AM, please  contact night coverage [email or web address]

## 2023-11-27 DIAGNOSIS — F0390 Unspecified dementia without behavioral disturbance: Secondary | ICD-10-CM | POA: Diagnosis not present

## 2023-11-27 DIAGNOSIS — Z515 Encounter for palliative care: Secondary | ICD-10-CM | POA: Diagnosis not present

## 2023-11-27 DIAGNOSIS — E876 Hypokalemia: Secondary | ICD-10-CM | POA: Diagnosis not present

## 2023-11-27 DIAGNOSIS — K5981 Ogilvie syndrome: Secondary | ICD-10-CM | POA: Diagnosis not present

## 2023-11-27 LAB — BASIC METABOLIC PANEL WITH GFR
Anion gap: 9 (ref 5–15)
BUN: 10 mg/dL (ref 8–23)
CO2: 21 mmol/L — ABNORMAL LOW (ref 22–32)
Calcium: 8.2 mg/dL — ABNORMAL LOW (ref 8.9–10.3)
Chloride: 109 mmol/L (ref 98–111)
Creatinine, Ser: 0.62 mg/dL (ref 0.61–1.24)
GFR, Estimated: >60 mL/min (ref >60)
Glucose, Bld: 87 mg/dL (ref 70–99)
Potassium: 2.9 mmol/L — ABNORMAL LOW (ref 3.5–5.1)
Sodium: 139 mmol/L (ref 135–145)

## 2023-11-27 LAB — CBC
HCT: 34.6 % — ABNORMAL LOW (ref 39.0–52.0)
Hemoglobin: 10.9 g/dL — ABNORMAL LOW (ref 13.0–17.0)
MCH: 26.1 pg (ref 26.0–34.0)
MCHC: 31.5 g/dL (ref 30.0–36.0)
MCV: 82.8 fL (ref 80.0–100.0)
Platelets: 225 10*3/uL (ref 150–400)
RBC: 4.18 MIL/uL — ABNORMAL LOW (ref 4.22–5.81)
RDW: 16.8 % — ABNORMAL HIGH (ref 11.5–15.5)
WBC: 8.2 10*3/uL (ref 4.0–10.5)
nRBC: 0 % (ref 0.0–0.2)

## 2023-11-27 LAB — MAGNESIUM: Magnesium: 2 mg/dL (ref 1.7–2.4)

## 2023-11-27 MED ORDER — POTASSIUM CHLORIDE 10 MEQ/100ML IV SOLN
10.0000 meq | INTRAVENOUS | Status: AC
  Administered 2023-11-27 (×4): 10 meq via INTRAVENOUS
  Filled 2023-11-27 (×3): qty 100

## 2023-11-27 NOTE — Progress Notes (Signed)
 Daily Progress Note   Patient Name: Adam Rios.       Date: 11/27/2023 DOB: January 13, 1941  Age: 83 y.o. MRN#: [id number] Attending Physician: Marsa Edelman, DO Primary Care Physician: [facility] Admit Date: 11/21/2023  Reason for Consultation/Follow-up: Establishing goals of care  HPI/Brief Hospital Review: 83 y.o. male  with past medical history of dementia, CHF, hypertension, history of remote CVA, renal cell carcinoma status post left nephrectomy in 2005, neurogenic bladder with chronic indwelling catheter, history of second-degree AV block status post PPM and history of ileus in April admitted from [facility] on 11/21/2023 with abdominal pain and distention.   CT abdomen obtained on arrival to ED which revealed ileus-GI and general surgery consulted, suspected mild colonic pseudoobstruction with recommendations to manage conservatively-rectal tube placed for decompression, possibly due to electrolyte imbalances Per review of record over the last several days Mr. Fanning has become more somnolent and confused with minimal p.o. intake   Conversations had earlier this a.m. with primary team and nursing staff regarding Mr. Marian being less responsive with mild tachycardia   Palliative medicine was consulted for assisting with goals of care conversations.  Subjective: Extensive chart review has been completed prior to meeting patient including labs, vital signs, imaging, progress notes, orders, and available advanced directive documents from current and previous encounters.    Visited with Mr. Burdin at his bedside.  He is resting in bed with eyes closed but does open his eyes and respond to calling of his name.  He is able to hold a very simple conversation with  appropriate responses.  He was able to tolerate a few bites of his breakfast but intake remains poor to minimal.  Assessed symptoms.  He denies acute pain or discomfort.  Palpated abdomen without discomfort.  Rectal tube remains in place for decompression.  Attempted call to daughter [name] without success, HIPAA compliant voicemail left.  Per conversation had with [name] yesterday plan remains to meet with family at bedside Monday morning when they return from out of town.  Objective:  Physical Exam Constitutional:      General: He is not in acute distress.    Appearance: He is ill-appearing.  Pulmonary:     Effort: Pulmonary effort is normal. No respiratory distress.  Abdominal:     General:  There is distension.     Tenderness: There is no abdominal tenderness.   Skin:    General: Skin is warm and dry.   Neurological:     Mental Status: He is alert.             Vital Signs: BP (!) 146/82 (BP Location: Left Arm)   Pulse 72   Temp 98 F (36.7 C) (Oral)   Resp 19   SpO2 97%  SpO2: SpO2: 97 % O2 Device: O2 Device: Room Air O2 Flow Rate: O2 Flow Rate (L/min): 3 L/min   Palliative Care Assessment & Plan   Assessment/Recommendation/Plan  Continue with current plan of care Anticipate family meeting Monday morning to discuss with care PMT to continue to follow for ongoing needs and support  Thank you for allowing the Palliative Medicine Team to assist in the care of this patient.  Total time:  25 minutes  Time spent includes: Detailed review of medical records (labs, imaging, vital signs), medically appropriate exam (mental status, respiratory, cardiac, skin), discussed with treatment team, counseling and educating patient, family and staff, documenting clinical information, medication management and coordination of care.  Waddell Lesches, DNP, AGNP-C Palliative Medicine   Please contact Palliative Medicine Team phone at [phone] for questions and concerns.

## 2023-11-27 NOTE — Plan of Care (Signed)
  Problem: Clinical Measurements: Goal: Diagnostic test results will improve Outcome: Progressing   Problem: Coping: Goal: Level of anxiety will decrease Outcome: Progressing   Problem: Pain Managment: Goal: General experience of comfort will improve and/or be controlled Outcome: Progressing

## 2023-11-27 NOTE — Progress Notes (Signed)
 PROGRESS NOTE    Adam Rios.   FMW:[id number] DOB: [birth year]  DOA: 11/21/2023 Date of Service: 11/27/23 which is hospital day 6  PCP: [facility], [facility] course / significant events:   HPI: 83 y.o. 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 Evidently developed constipation several weeks ago. Then in the last few days, his belly became distended, painful. An x-ray at his facility two days prior to ED showed volvulus reportedly. He was having jelly like stools. So he was sent to the ER.   06/22: to ED, CT showed ileus. EDP spoke with Dr. Marinda General Surgery and Dr. Therisa GI. The consensus is that it may be ileus or (given degree of distension on CT) mild colonic pseudoobstruction, will attempt to manage conservatively. Admitted to hospiatlist  06/23: Had BM Overnight per RN. Per gen surg - history and exam consistent with Ogilvie syndrome, recs rectal tube placement to decompress, question whether candidate for atropine, would defer to GI. Per GI - keep K over 4 and Mg over 2, agree rectal tube, consider decompression colonoscopy but defer for now given likelihood of recurrence will try to maintatin conservative tx 06/24: not much improvement, plan KUB tomorrow am 06/25: KUB showing minimal improvement, some bowel function 06/26: pt reports feeling improved, not eating much, gradually more somnolent and confused over past couple days, SLP to see. Suprapubic cath exchanged (clogged and unable to flush). D/w daughter - will monitor for now, if not eating through the weekend will need to consider hospice 06/27: Palliative to see. Pt sleepy this AM, but rouses to voice and answers some yes/no questions. Sinus tach, will trial IV fluids, pending palliative d/w family today  06/28: a bit more alert  today, still not eating much,      Consultants:  General Surgery Gastroenterology Palliative Care   Procedures/Surgeries: none      ASSESSMENT & PLAN:   Ileus / Pseudoobstruction  Expect neostigmine will be contraindicated in this case given his cardiac and urinary history, and carry significant risk regardless given his age and functional status.  no evidence of bowel compromise on imaging nor examination at this time. At this point, he would require subtotal colectomy and end ileostomy creation which would carry significant morbidity and mortality in his situation  Advance diet as tolerated / as able but pt is reluctant to eat  Rectal tube per GI  Optimize electrolytes - keep K over 4 and Mg over 2 Ok for up w/ PT/OT Palliative consult - see GOC below   Hypoglycemia While NPO D5NS fluids D50 amp prn  Monitor CLG as needed / Glc w/ am labs  Goals of care: Discussed today w/ his daughter Adam Rios over the phone 11/25/23 4:47 PM. Discussed that he is still not really moving bowels much, he is uncomfortable, not eating well, more confused/agitated. She shares that he has declined in his cognitive and functional status over the past few months.  Discussed that plan for now would be to continue treatment as we are doing and hopefully he will improve but even if he does this issue will likely recur and he will continue to decline especially if he is not meeting his nutritional needs. If he declines further while here and/or if he is not eating much through the  weekend would strongly consider comfort measures vs full scope treatment. She is agreeable to this plan for now.  Palliative consult to discuss further, daughter is open to discussions about possibly avoiding hospital, consideration for hospice, etc    Hypokalemia Replace as needed Monitor BMP Pt has been refusing K po supplementation   Dementia without behavioral disturbance  hold sertraline    Sinus tach Long  QT VSS otherwise Trial fluids   Iron  deficiency anemia due to chronic blood loss Hemoglobin stable relative to baseline, no bleeding reported Monitor CBC   Chronic indwelling suprapubic catheter secondary to chronic neurogenic bladder Exchanged 11/25/23 (clogged and unable to flush) Continue Vesicare, finasteride  Hold Flomax , Myrbetriq  for now in light of reduced colonic transit   Leukocytosis: resolved likely secondary to lieus, reactive will trend   Chronic diastolic CHF (congestive heart failure)  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 Significantly low or high BMI is associated with higher medical risk.  Underweight - under 18  overweight - 25 to 29 obese - 30 or more Class 1 obesity: BMI of 30.0 to 34 Class 2 obesity: BMI of 35.0 to 39 Class 3 obesity: BMI of 40.0 to 49 Super Morbid Obesity: BMI 50-59 Super-super Morbid Obesity: BMI 60+ Healthy nutrition and physical activity advised as adjunct to other disease management and risk reduction treatments    DVT prophylaxis: lovenox  IV fluids: NS 1L today  Nutrition: clears advance per gen surg / GI  Central lines / other devices: rectal tube, chronic suprapubic catheter   Code Status: DNR ACP documentation reviewed: none on file in [facility]  TOC needs: TBD Medical barriers to dispo: still not demonstrating adequate bowel function / po tolerance. Expected medical readiness for discharge next 2 days .              Subjective / Brief ROS:  Patient ore alert today, answering questions w/ more detail, states had some breakfast w/ few bites yogurt today    Family Communication: none at this time     Objective  Findings:  Vitals:   11/26/23 1547 11/26/23 1917 11/27/23 0427 11/27/23 0714  BP: 133/77 (!) 140/88 139/80 (!) 159/83  Pulse: (!) 103 76 70 70  Resp: 17 16 16 17   Temp: 98.7 F (37.1 C) 98.2 F (36.8 C) 97.8 F (36.6 C) 98 F (36.7 C)  TempSrc: Oral   Oral  SpO2: 95% 95% 96% 97%    Intake/Output Summary (Last 24 hours) at 11/27/2023 1408 Last data filed at 11/27/2023 1054 Gross per 24 hour  Intake 600.87 ml  Output 1125 ml  Net -524.13 ml   There were no vitals filed for this visit.  Examination:  Physical Exam Constitutional:      General: He is not in acute distress.    Appearance: He is ill-appearing.   Cardiovascular:     Rate and Rhythm: Normal rate and regular rhythm.  Pulmonary:     Effort: Pulmonary effort is normal.     Breath sounds: Normal breath sounds.  Abdominal:     General: Bowel sounds are increased.     Palpations: Abdomen is soft. There is no fluid wave.     Tenderness:  There is generalized abdominal tenderness. There is no guarding or rebound.   Skin:    General: Skin is warm and dry.   Neurological:     Mental Status: He is alert.   Psychiatric:        Mood and Affect: Mood normal.        Behavior: Behavior normal.          Scheduled Medications:   Chlorhexidine  Gluconate Cloth  6 each Topical Daily   enoxaparin  (LOVENOX ) injection  40 mg Subcutaneous Q24H   polyethylene glycol  17 g Oral Daily    Continuous Infusions:     PRN Medications:  acetaminophen  **OR** acetaminophen , dextrose , LORazepam   Antimicrobials from admission:  Anti-infectives (From admission, onward)    None           Data Reviewed:  I have personally reviewed the following...  CBC: Recent Labs  Lab 11/21/23 1730 11/22/23 0131 11/23/23 0451 11/24/23 0410 11/26/23 0408 11/27/23 0525  WBC 9.9 14.6* 10.0 9.4 9.0 8.2  NEUTROABS 6.8  --   --   --   --   --   HGB 11.7* 12.2* 10.9* 10.7* 11.0* 10.9*  HCT 37.1* 40.1 36.1* 34.8* 34.7* 34.6*   MCV 81.2 82.5 83.6 82.3 80.9 82.8  PLT 268 275 236 230 220 225   Basic Metabolic Panel: Recent Labs  Lab 11/21/23 1730 11/22/23 0131 11/23/23 0451 11/24/23 0410 11/26/23 0408 11/27/23 0525  NA 136 137 140 136 140 139  K 2.7* 3.1* 3.6 3.7 3.1* 2.9*  CL 106 106 111 108 108 109  CO2 21* 23 24 22 24  21*  GLUCOSE 102* 119* 96 69* 94 87  BUN 14 14 11 9 8 10   CREATININE 0.70 0.75 0.66 0.52* 0.56* 0.62  CALCIUM  8.3* 8.4* 8.2* 8.1* 8.4* 8.2*  MG 2.1 2.0 1.9 1.7  --  2.0   GFR: CrCl cannot be calculated (Unknown ideal weight.). Liver Function Tests: Recent Labs  Lab 11/21/23 1730  AST 13*  ALT 11  ALKPHOS 122  BILITOT 0.8  PROT 6.4*  ALBUMIN 2.8*   Recent Labs  Lab 11/21/23 1730  LIPASE 22   No results for input(s): AMMONIA in the last 168 hours. Coagulation Profile: No results for input(s): INR, PROTIME in the last 168 hours. Cardiac Enzymes: No results for input(s): CKTOTAL, CKMB, CKMBINDEX, TROPONINI in the last 168 hours. BNP (last 3 results) No results for input(s): PROBNP in the last 8760 hours. HbA1C: No results for input(s): HGBA1C in the last 72 hours. CBG: Recent Labs  Lab 11/24/23 0744 11/24/23 0917 11/26/23 1609  GLUCAP 67* 75 104*   Lipid Profile: No results for input(s): CHOL, HDL, LDLCALC, TRIG, CHOLHDL, LDLDIRECT in the last 72 hours. Thyroid  Function Tests: No results for input(s): TSH, T4TOTAL, FREET4, T3FREE, THYROIDAB in the last 72 hours. Anemia Panel: No results for input(s): VITAMINB12, FOLATE, FERRITIN, TIBC, IRON , RETICCTPCT in the last 72 hours. Most Recent Urinalysis On File:     Component Value Date/Time   COLORURINE YELLOW (A) 09/15/2023 1821   APPEARANCEUR CLOUDY (A) 09/15/2023 1821   LABSPEC 1.026 09/15/2023 1821   PHURINE 5.0 09/15/2023 1821   GLUCOSEU NEGATIVE 09/15/2023 1821   HGBUR SMALL (A) 09/15/2023 1821   BILIRUBINUR NEGATIVE 09/15/2023 1821   KETONESUR NEGATIVE  09/15/2023 1821   PROTEINUR 100 (A) 09/15/2023 1821   NITRITE POSITIVE (A) 09/15/2023 1821   LEUKOCYTESUR LARGE (A) 09/15/2023 1821   Sepsis Labs: @LABRCNTIP (procalcitonin:4,lacticidven:4) Microbiology: Recent Results (from the past 240  hours)  MRSA Next Gen by PCR, Nasal     Status: None   Collection Time: 11/26/23  7:55 AM   Specimen: Nasal Mucosa; Nasal Swab  Result Value Ref Range Status   MRSA by PCR Next Gen NOT DETECTED NOT DETECTED Final    Comment: (NOTE) The GeneXpert MRSA Assay (FDA approved for NASAL specimens only), is one component of a comprehensive MRSA colonization surveillance program. It is not intended to diagnose MRSA infection nor to guide or monitor treatment for MRSA infections. Test performance is not FDA approved in patients less than 61 years old. 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 No pneumoperitoneum. No organomegaly or radiopaque calculi. Diffuse aortoiliac atherosclerosis. Left hip arthroplasty appears anatomically aligned. Diffuse osteopenia.Multilevel degenerative disc disease of the spine. IMPRESSION: Persistent gaseous distension of the colon, overall slightly decreased in the interim. Electronically Signed   By: Rogelia Myers M.D.   On: 11/24/2023 11:55         Laneta Blunt, DO [facility] 11/27/2023, 2:08 PM    Dictation software may have been used to generate the above note. Typos may occur and escape review in typed/dictated notes. Please contact Dr Blunt directly for clarity if needed.  Staff may message me via secure chat in Epic  but this may not receive an immediate response,  please  page me for urgent matters!  If 7PM-7AM, please contact night coverage [email or web address]

## 2023-11-28 DIAGNOSIS — K5981 Ogilvie syndrome: Secondary | ICD-10-CM | POA: Diagnosis not present

## 2023-11-28 DIAGNOSIS — E876 Hypokalemia: Secondary | ICD-10-CM | POA: Diagnosis not present

## 2023-11-28 DIAGNOSIS — F0390 Unspecified dementia without behavioral disturbance: Secondary | ICD-10-CM | POA: Diagnosis not present

## 2023-11-28 DIAGNOSIS — Z515 Encounter for palliative care: Secondary | ICD-10-CM | POA: Diagnosis not present

## 2023-11-28 LAB — BASIC METABOLIC PANEL WITH GFR
Anion gap: 7 (ref 5–15)
BUN: 10 mg/dL (ref 8–23)
CO2: 25 mmol/L (ref 22–32)
Calcium: 8.2 mg/dL — ABNORMAL LOW (ref 8.9–10.3)
Chloride: 105 mmol/L (ref 98–111)
Creatinine, Ser: 0.64 mg/dL (ref 0.61–1.24)
GFR, Estimated: >60 mL/min (ref >60)
Glucose, Bld: 90 mg/dL (ref 70–99)
Potassium: 3.1 mmol/L — ABNORMAL LOW (ref 3.5–5.1)
Sodium: 137 mmol/L (ref 135–145)

## 2023-11-28 LAB — MAGNESIUM: Magnesium: 1.9 mg/dL (ref 1.7–2.4)

## 2023-11-28 MED ORDER — POTASSIUM CHLORIDE 10 MEQ/100ML IV SOLN
10.0000 meq | INTRAVENOUS | Status: AC
  Administered 2023-11-28 (×4): 10 meq via INTRAVENOUS
  Filled 2023-11-28: qty 100

## 2023-11-28 NOTE — Plan of Care (Signed)
   Problem: Education: Goal: Knowledge of General Education information will improve Description: Including pain rating scale, medication(s)/side effects and non-pharmacologic comfort measures Outcome: Progressing   Problem: Nutrition: Goal: Adequate nutrition will be maintained Outcome: Progressing   Problem: Coping: Goal: Level of anxiety will decrease Outcome: Progressing

## 2023-11-28 NOTE — Progress Notes (Signed)
 PROGRESS NOTE    Adam Rios.   FMW:[id number] DOB: 01/31/1941  DOA: 11/21/2023 Date of Service: 11/28/23 which is hospital day 7  PCP: [facility], [facility] course / significant events:   HPI: 83 y.o. 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 Evidently developed constipation several weeks ago. Then in the last few days, his belly became distended, painful. An x-ray at his facility two days prior to ED showed volvulus reportedly. He was having jelly like stools. So he was sent to the ER.   06/22: to ED, CT showed ileus. EDP spoke with Dr. Marinda General Surgery and Dr. Therisa GI. The consensus is that it may be ileus or (given degree of distension on CT) mild colonic pseudoobstruction, will attempt to manage conservatively. Admitted to hospiatlist  06/23: Had BM Overnight per RN. Per gen surg - history and exam consistent with Ogilvie syndrome, recs rectal tube placement to decompress, question whether candidate for atropine, would defer to GI. Per GI - keep K over 4 and Mg over 2, agree rectal tube, consider decompression colonoscopy but defer for now given likelihood of recurrence will try to maintatin conservative tx 06/24: not much improvement, plan KUB tomorrow am 06/25: KUB showing minimal improvement, some bowel function 06/26: pt reports feeling improved, not eating much, gradually more somnolent and confused over past couple days, SLP to see. Suprapubic cath exchanged (clogged and unable to flush). D/w daughter - will monitor for now, if not eating through the weekend will need to consider hospice 06/27: Palliative to see. Pt sleepy this AM, but rouses to voice and answers some yes/no questions. Sinus tach, will trial IV fluids, pending palliative d/w family today  06/28-06/29: a bit more  alert, still not eating much, per nursing staff he will hold food in his mouth but then not swallow it      Consultants:  General Surgery Gastroenterology Palliative Care   Procedures/Surgeries: none      ASSESSMENT & PLAN:   Ileus / Pseudoobstruction - resolved  Expect neostigmine will be contraindicated in this case given his cardiac and urinary history, and carry significant risk regardless given his age and functional status.  no evidence of bowel compromise on imaging nor examination at this time. At this point, he would require subtotal colectomy and end ileostomy creation which would carry significant morbidity and mortality in his situation  Advance diet as tolerated / as able but pt is reluctant to eat  Rectal tube  - trial dc today  Optimize electrolytes - keep K over 4 and Mg over 2 Ok for up w/ PT/OT Palliative consult - see GOC below, daughter to visit Mon 06/30 and further discussion at that time   Hypoglycemia - resolved  D50 amp prn  Monitor CBG as needed / Glc w/ am labs  Goals of care: Discussed w/ his daughter Osborn Pullin over the phone 11/25/23 4:47 PM. Discussed that he is still not really moving bowels much, he is uncomfortable, not eating well, more confused/agitated. She shares that he has declined in his cognitive and functional status over the past few months.  Discussed that plan for now would be to continue treatment as we are doing and hopefully he will improve but even if he does this issue will likely recur and he  will continue to decline especially if he is not meeting his nutritional needs. If he declines further while here and/or if he is not eating much through the weekend would strongly consider comfort measures vs full scope treatment. She is agreeable to this plan for now.  Palliative consult to discuss further, daughter is open to discussions about possibly avoiding hospital, consideration for hospice, etc  daughter to visit Mon 06/30 and  further discussion at that time    Hypokalemia Replace as needed Monitor BMP Pt has been refusing K po supplementation   Dementia without behavioral disturbance  hold sertraline    Sinus tach Long QT VSS otherwise Trial fluids   Iron  deficiency anemia due to chronic blood loss Hemoglobin stable relative to baseline, no bleeding reported Monitor CBC   Chronic indwelling suprapubic catheter secondary to chronic neurogenic bladder Exchanged 11/25/23 (clogged and unable to flush) Continue Vesicare, finasteride  Hold Flomax , Myrbetriq  for now in light of reduced colonic transit   Leukocytosis: resolved likely secondary to lieus, reactive will trend   Chronic diastolic CHF (congestive heart failure)  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 Significantly low or high BMI is associated with higher medical risk.  Underweight - under 18  overweight - 25 to 29 obese - 30 or more Class 1 obesity: BMI of 30.0 to 34 Class 2 obesity: BMI of 35.0 to 39 Class 3 obesity: BMI of 40.0 to 49 Super Morbid Obesity: BMI 50-59 Super-super Morbid Obesity: BMI 60+ Healthy nutrition and physical activity advised as adjunct to other disease management and risk reduction treatments    DVT prophylaxis: lovenox  IV fluids: NS 1L today  Nutrition: clears advance per gen surg / GI  Central lines / other devices: rectal tube, chronic suprapubic catheter   Code Status: DNR ACP documentation reviewed: none on file in [facility]  TOC needs: placmene,t possibly hospice, pending family meeting tomorrow Mon 06/30 Medical barriers to dispo: borderline adequate bowel function / po tolerance but likely has reached  maximal medical benefit. Expected medical readiness for discharge tomorrow .              Subjective / Brief ROS:  Patient ore alert today, states he'd like his breakfast and is able to feed himself, it is within reach. Per LPN he is not eating, he will hold food in his mouth but not swallow.    Family Communication: none at this time     Objective Findings:  Vitals:   11/27/23 1517 11/27/23 2026 11/28/23 0445 11/28/23 0712  BP: (!) 146/82 (!) 141/85 135/77 137/79  Pulse: 72 73 63 65  Resp: 19 18 18 17   Temp:  98.4 F (36.9 C) 98.4 F (36.9 C) 97.8 F (36.6 C)  TempSrc:  Oral    SpO2: 97% 95% 99% 98%    Intake/Output Summary (Last 24 hours) at 11/28/2023 1206 Last data filed at 11/28/2023 0801 Gross per 24 hour  Intake 332.03 ml  Output 600 ml  Net -267.97 ml   There were no vitals filed for this visit.  Examination:  Physical Exam Constitutional:      General: He is not in acute distress.    Appearance: He is ill-appearing.  Cardiovascular:     Rate and Rhythm: Normal rate and regular rhythm.  Pulmonary:     Effort: Pulmonary effort is normal.     Breath sounds: Normal breath sounds.  Abdominal:     General: Bowel sounds are increased.     Palpations: Abdomen is soft. There is no fluid wave.     Tenderness: There is no abdominal tenderness. There is no guarding or rebound.   Skin:    General: Skin is warm and dry.   Neurological:     Mental Status: He is alert. He is disoriented.   Psychiatric:        Mood and Affect: Mood normal.        Behavior: Behavior normal.          Scheduled Medications:   Chlorhexidine  Gluconate Cloth  6 each Topical Daily   enoxaparin  (LOVENOX ) injection  40 mg Subcutaneous Q24H   polyethylene glycol  17 g Oral Daily    Continuous Infusions:  potassium chloride  10 mEq (11/28/23 1138)      PRN Medications:  acetaminophen  **OR** acetaminophen , dextrose , LORazepam   Antimicrobials from admission:   Anti-infectives (From admission, onward)    None           Data Reviewed:  I have personally reviewed the following...  CBC: Recent Labs  Lab 11/21/23 1730 11/22/23 0131 11/23/23 0451 11/24/23 0410 11/26/23 0408 11/27/23 0525  WBC 9.9 14.6* 10.0 9.4 9.0 8.2  NEUTROABS 6.8  --   --   --   --   --   HGB 11.7* 12.2* 10.9* 10.7* 11.0* 10.9*  HCT 37.1* 40.1 36.1* 34.8* 34.7* 34.6*  MCV 81.2 82.5 83.6 82.3 80.9 82.8  PLT 268 275 236 230 220 225   Basic Metabolic Panel: Recent Labs  Lab 11/22/23 0131 11/23/23 0451 11/24/23 0410 11/26/23 0408 11/27/23 0525 11/28/23 0740  NA 137 140 136 140 139 137  K 3.1* 3.6 3.7 3.1* 2.9* 3.1*  CL 106 111 108 108 109 105  CO2 23 24 22 24  21* 25  GLUCOSE 119* 96 69* 94 87 90  BUN 14 11 9 8 10 10   CREATININE 0.75 0.66 0.52* 0.56* 0.62 0.64  CALCIUM  8.4* 8.2* 8.1* 8.4* 8.2* 8.2*  MG 2.0 1.9 1.7  --  2.0 1.9   GFR: CrCl cannot be calculated (Unknown ideal weight.). Liver Function Tests: Recent Labs  Lab 11/21/23 1730  AST 13*  ALT 11  ALKPHOS 122  BILITOT 0.8  PROT 6.4*  ALBUMIN 2.8*   Recent Labs  Lab 11/21/23 1730  LIPASE 22   No results for input(s): AMMONIA in the last 168 hours. Coagulation Profile: No results for input(s): INR, PROTIME in the last 168 hours. Cardiac Enzymes: No results for input(s): CKTOTAL, CKMB, CKMBINDEX, TROPONINI in the last 168 hours. BNP (last 3 results) No results for input(s): PROBNP in the last 8760 hours. HbA1C: No results for input(s): HGBA1C in the last 72 hours. CBG: Recent Labs  Lab 11/24/23 0744 11/24/23 0917 11/26/23 1609  GLUCAP 67* 75 104*   Lipid Profile: No results for input(s): CHOL, HDL, LDLCALC, TRIG, CHOLHDL, LDLDIRECT in the last 72 hours. Thyroid  Function Tests: No results for input(s): TSH, T4TOTAL, FREET4, T3FREE, THYROIDAB in the last 72 hours. Anemia Panel: No results for input(s): VITAMINB12, FOLATE,  FERRITIN, TIBC, IRON , RETICCTPCT in the last 72 hours. Most Recent Urinalysis On File:     Component Value Date/Time   COLORURINE YELLOW (A) 09/15/2023 1821   APPEARANCEUR CLOUDY (  A) 09/15/2023 1821   LABSPEC 1.026 09/15/2023 1821   PHURINE 5.0 09/15/2023 1821   GLUCOSEU NEGATIVE 09/15/2023 1821   HGBUR SMALL (A) 09/15/2023 1821   BILIRUBINUR NEGATIVE 09/15/2023 1821   KETONESUR NEGATIVE 09/15/2023 1821   PROTEINUR 100 (A) 09/15/2023 1821   NITRITE POSITIVE (A) 09/15/2023 1821   LEUKOCYTESUR LARGE (A) 09/15/2023 1821   Sepsis Labs: @LABRCNTIP (procalcitonin:4,lacticidven:4) Microbiology: Recent Results (from the past 240 hours)  MRSA Next Gen by PCR, Nasal     Status: None   Collection Time: 11/26/23  7:55 AM   Specimen: Nasal Mucosa; Nasal Swab  Result Value Ref Range Status   MRSA by PCR Next Gen NOT DETECTED NOT DETECTED Final    Comment: (NOTE) The GeneXpert MRSA Assay (FDA approved for NASAL specimens only), is one component of a comprehensive MRSA colonization surveillance program. It is not intended to diagnose MRSA infection nor to guide or monitor treatment for MRSA infections. Test performance is not FDA approved in patients less than 43 years old. Performed at [facility], [address], [place], [place] [address]       Radiology Studies last 3 days: No results found.        Malik Ruffino, DO [facility] 11/28/2023, 12:06 PM    Dictation software may have been used to generate the above note. Typos may occur and escape review in typed/dictated notes. Please contact Dr Marsa directly for clarity if needed.  Staff may message me via secure chat in Epic  but this may not receive an immediate response,  please page me for urgent matters!  If 7PM-7AM, please contact night coverage [email or web address]

## 2023-11-28 NOTE — Plan of Care (Signed)
   Problem: Education: Goal: Knowledge of General Education information will improve Description: Including pain rating scale, medication(s)/side effects and non-pharmacologic comfort measures Outcome: Not Progressing

## 2023-11-28 NOTE — Progress Notes (Signed)
 Daily Progress Note   Patient Name: Adam Rios.       Date: 11/28/2023 DOB: 12/07/1940  Age: 83 y.o. MRN#: [id number] Attending Physician: Marsa Edelman, DO Primary Care Physician: [facility] Admit Date: 11/21/2023  Reason for Consultation/Follow-up: Establishing goals of care  HPI/Brief Hospital Review: 83 y.o. male  with past medical history of dementia, CHF, hypertension, history of remote CVA, renal cell carcinoma status post left nephrectomy in 2005, neurogenic bladder with chronic indwelling catheter, history of second-degree AV block status post PPM and history of ileus in April admitted from [facility] on 11/21/2023 with abdominal pain and distention.   CT abdomen obtained on arrival to ED which revealed ileus-GI and general surgery consulted, suspected mild colonic pseudoobstruction with recommendations to manage conservatively-rectal tube placed for decompression, possibly due to electrolyte imbalances Per review of record over the last several days Adam Rios has become more somnolent and confused with minimal p.o. intake   Conversations had earlier this a.m. with primary team and nursing staff regarding Adam Rios being less responsive with mild tachycardia   Palliative medicine was consulted for assisting with goals of care conversations.  Subjective: Extensive chart review has been completed prior to meeting patient including labs, vital signs, imaging, progress notes, orders, and available advanced directive documents from current and previous encounters.    Visited with Adam Rios at his bedside. He is in bed with eyes open, awake and alert. He is able to tell me his name and birth date, does not respond to further orientation questions. Breakfast  try with few bites consumed, he declines further assistance with eating or drinking. No family at bedside during time of visit.  Assessed symptoms, he reports feeling well today. Denies acute pain or discomfort, denies abdominal pain or tenderness on palpation.  Anticipating visit with family tomorrow AM for goals of care discussions.  Thank you for allowing the Palliative Medicine Team to assist in the care of this patient.  Total time:  25 minutes  Time spent includes: Detailed review of medical records (labs, imaging, vital signs), medically appropriate exam (mental status, respiratory, cardiac, skin), discussed with treatment team, counseling and educating patient, family and staff, documenting clinical information, medication management and coordination of care.  Adam Lesches, DNP, AGNP-C Palliative Medicine   Please contact  Palliative Medicine Team phone at [phone] for questions and concerns.

## 2023-11-29 DIAGNOSIS — Z515 Encounter for palliative care: Secondary | ICD-10-CM | POA: Diagnosis not present

## 2023-11-29 DIAGNOSIS — E876 Hypokalemia: Secondary | ICD-10-CM | POA: Diagnosis not present

## 2023-11-29 DIAGNOSIS — F0390 Unspecified dementia without behavioral disturbance: Secondary | ICD-10-CM | POA: Diagnosis not present

## 2023-11-29 DIAGNOSIS — K5981 Ogilvie syndrome: Secondary | ICD-10-CM | POA: Diagnosis not present

## 2023-11-29 LAB — BASIC METABOLIC PANEL WITH GFR
Anion gap: 9 (ref 5–15)
BUN: 11 mg/dL (ref 8–23)
CO2: 21 mmol/L — ABNORMAL LOW (ref 22–32)
Calcium: 8.3 mg/dL — ABNORMAL LOW (ref 8.9–10.3)
Chloride: 103 mmol/L (ref 98–111)
Creatinine, Ser: 0.49 mg/dL — ABNORMAL LOW (ref 0.61–1.24)
GFR, Estimated: >60 mL/min (ref >60)
Glucose, Bld: 84 mg/dL (ref 70–99)
Potassium: 3.4 mmol/L — ABNORMAL LOW (ref 3.5–5.1)
Sodium: 133 mmol/L — ABNORMAL LOW (ref 135–145)

## 2023-11-29 MED ORDER — SERTRALINE HCL 50 MG PO TABS: 25.0000 mg | ORAL_TABLET | Freq: Every day | ORAL | Status: DC

## 2023-11-29 MED ORDER — ACETAMINOPHEN 325 MG PO TABS: 650.0000 mg | ORAL_TABLET | Freq: Four times a day (QID) | ORAL | Status: DC | PRN

## 2023-11-29 MED ORDER — SPIRONOLACTONE 25 MG PO TABS: 25.0000 mg | ORAL_TABLET | Freq: Every day | ORAL | Status: DC

## 2023-11-29 MED ORDER — MORPHINE SULFATE (CONCENTRATE) 10 MG /0.5 ML PO SOLN: 5.0000 mg | ORAL | Status: DC | PRN

## 2023-11-29 MED ORDER — ACETAMINOPHEN 650 MG RE SUPP: 650.0000 mg | Freq: Four times a day (QID) | RECTAL | Status: DC | PRN

## 2023-11-29 MED ORDER — LORAZEPAM 1 MG PO TABS: 1.0000 mg | ORAL_TABLET | ORAL | Status: DC | PRN

## 2023-11-29 MED ORDER — POLYVINYL ALCOHOL 1.4 % OP SOLN: 1.0000 [drp] | Freq: Four times a day (QID) | OPHTHALMIC | Status: DC | PRN

## 2023-11-29 MED ORDER — GLYCOPYRROLATE 0.2 MG/ML IJ SOLN: 0.2000 mg | INTRAMUSCULAR | Status: DC | PRN

## 2023-11-29 MED ORDER — LORAZEPAM 2 MG/ML PO CONC
1.0000 mg | ORAL | Status: DC | PRN
  Filled 2023-11-29: qty 0.5

## 2023-11-29 MED ORDER — LACTULOSE 10 GM/15ML PO SOLN: 10.0000 g | Freq: Two times a day (BID) | ORAL | Status: DC

## 2023-11-29 MED ORDER — LORAZEPAM 2 MG/ML IJ SOLN: 1.0000 mg | INTRAMUSCULAR | Status: DC | PRN

## 2023-11-29 MED ORDER — BIOTENE DRY MOUTH MT LIQD: 15.0000 mL | OROMUCOSAL | Status: DC | PRN

## 2023-11-29 MED ORDER — PANTOPRAZOLE SODIUM 20 MG PO TBEC
20.0000 mg | DELAYED_RELEASE_TABLET | Freq: Every day | ORAL | Status: DC
Start: 2023-11-29 — End: 2023-11-29
  Filled 2023-11-29: qty 1

## 2023-11-29 MED ORDER — BISACODYL 10 MG RE SUPP: 10.0000 mg | Freq: Every day | RECTAL | Status: DC | PRN

## 2023-11-29 MED ORDER — POTASSIUM CHLORIDE 10 MEQ/100ML IV SOLN
10.0000 meq | INTRAVENOUS | Status: DC
  Administered 2023-11-29: 10 meq via INTRAVENOUS
  Filled 2023-11-29: qty 100

## 2023-11-29 MED ORDER — TAMSULOSIN HCL 0.4 MG PO CAPS: 0.4000 mg | ORAL_CAPSULE | Freq: Every day | ORAL | Status: DC

## 2023-11-29 MED ORDER — GLYCOPYRROLATE 1 MG PO TABS: 1.0000 mg | ORAL_TABLET | ORAL | Status: DC | PRN

## 2023-11-29 MED ORDER — POLYETHYLENE GLYCOL 3350 17 G PO PACK: 17.0000 g | PACK | Freq: Every day | ORAL | Status: DC | PRN

## 2023-11-29 NOTE — Progress Notes (Signed)
 PROGRESS NOTE    Adam Rios.   FMW:[id number] DOB: 05-16-1941  DOA: 11/21/2023 Date of Service: 11/29/23 which is hospital day 8  PCP: [facility] course / significant events:   HPI: 83 y.o. 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 Evidently developed constipation several weeks ago. Then in the last few days, his belly became distended, painful. An x-ray at his facility two days prior to ED showed volvulus reportedly. He was having jelly like stools. So he was sent to the ER.   06/22: to ED, CT showed ileus. EDP spoke with Dr. Marinda General Surgery and Dr. Therisa GI. The consensus is that it may be ileus or (given degree of distension on CT) mild colonic pseudoobstruction, will attempt to manage conservatively. Admitted to hospiatlist  06/23: Had BM Overnight per RN. Per gen surg - history and exam consistent with Ogilvie syndrome, recs rectal tube placement to decompress, question whether candidate for atropine, would defer to GI. Per GI - keep K over 4 and Mg over 2, agree rectal tube, consider decompression colonoscopy but defer for now given likelihood of recurrence will try to maintatin conservative tx 06/24: not much improvement, plan KUB tomorrow am 06/25: KUB showing minimal improvement, some bowel function 06/26: pt reports feeling improved, not eating much, gradually more somnolent and confused over past couple days, SLP to see. Suprapubic cath exchanged (clogged and unable to flush). D/w daughter - will monitor for now, if not eating through the weekend will need to consider hospice 06/27: Palliative to see. Pt sleepy this AM, but rouses to voice and answers some yes/no questions. Sinus tach, will trial IV fluids, pending palliative d/w family today  06/28-06/29: a bit more  alert, still not eating much, per nursing staff he will hold food in his mouth but then not swallow it  06/30: remains poor appetite and minimal bowel function. 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 History of CVA (cerebrovascular accident) D/c labs / interventions on comfort measures   Dementia without behavioral disturbance Symptom management as needed  Chronic indwelling suprapubic catheter secondary to chronic neurogenic bladder Bladder scan as needed, exchange cath as needed for comfort / bladder drainage                Subjective / Brief ROS:  Patient not contributory    Family Communication:discussion w/ palliative care today     Objective Findings:  Vitals:   11/28/23 0445 11/28/23 0712 11/29/23 0437 11/29/23 0800  BP: 135/77 137/79 (!) 138/91 134/88  Pulse: 63 65 98 87  Resp: 18 17 16 16   Temp: 98.4 F (36.9 C) 97.8 F (36.6 C) 98.2 F (36.8 C) (!) 97.5 F (36.4 C)  TempSrc:   Oral Oral  SpO2: 99%  98% 97% 99%    Intake/Output Summary (Last 24 hours) at 11/29/2023 1457 Last data filed at 11/29/2023 0106 Gross per 24 hour  Intake 362.21 ml  Output 450 ml  Net -87.79 ml   There were no vitals filed for this visit.  Examination:  Physical Exam Constitutional:      General: He is not in acute distress.    Appearance: He is ill-appearing.   Cardiovascular:     Rate and Rhythm: Normal rate and regular rhythm.   Pulmonary:     Effort: Pulmonary effort is normal.     Breath sounds: Normal breath sounds.  Abdominal:     General: Bowel sounds are increased.     Palpations: Abdomen is soft. There is no fluid wave.     Tenderness: There is no abdominal tenderness. There is no guarding or rebound.   Skin:    General: Skin is warm and dry.   Neurological:     Mental Status: He is alert. He is disoriented.   Psychiatric:        Mood and Affect: Mood normal.        Behavior: Behavior normal.          Scheduled Medications:   Chlorhexidine  Gluconate Cloth  6 each Topical Daily    Continuous Infusions:      PRN Medications:  acetaminophen  **OR** acetaminophen , antiseptic oral rinse, artificial tears, bisacodyl , glycopyrrolate **OR** glycopyrrolate **OR** glycopyrrolate, LORazepam  **OR** LORazepam  **OR** LORazepam , morphine  CONCENTRATE **OR** morphine  CONCENTRATE, polyethylene glycol  Antimicrobials from admission:  Anti-infectives (From admission, onward)    None           Data Reviewed:  I have personally reviewed the following...  CBC: Recent Labs  Lab 11/23/23 0451 11/24/23 0410 11/26/23 0408 11/27/23 0525  WBC 10.0 9.4 9.0 8.2  HGB 10.9* 10.7* 11.0* 10.9*  HCT 36.1* 34.8* 34.7* 34.6*  MCV 83.6 82.3 80.9 82.8  PLT 236 230 220 225   Basic Metabolic Panel: Recent Labs  Lab 11/23/23 0451 11/24/23 0410 11/26/23 0408 11/27/23 0525 11/28/23 0740 11/29/23 0425  NA 140 136 140 139 137 133*  K 3.6 3.7 3.1* 2.9* 3.1* 3.4*  CL 111 108 108 109 105 103  CO2 24 22 24  21* 25 21*  GLUCOSE 96 69* 94 87 90 84  BUN 11 9 8 10 10 11   CREATININE 0.66 0.52* 0.56* 0.62 0.64 0.49*  CALCIUM  8.2* 8.1* 8.4* 8.2* 8.2* 8.3*  MG 1.9 1.7  --  2.0 1.9  --    GFR: CrCl cannot be calculated (Unknown ideal weight.). Liver Function Tests: No results for input(s): AST, ALT, ALKPHOS, BILITOT, PROT, ALBUMIN in the last 168 hours.  No results for input(s): LIPASE,  AMYLASE in the last 168 hours.  No results for input(s): AMMONIA in the last 168 hours. Coagulation Profile: No results for input(s): INR, PROTIME in the last 168 hours. Cardiac Enzymes: No results for input(s): CKTOTAL, CKMB, CKMBINDEX, TROPONINI in the last 168 hours. BNP (last 3 results) No results for input(s): PROBNP in the last 8760 hours. HbA1C: No results for input(s): HGBA1C in the last 72 hours. CBG: Recent Labs  Lab 11/24/23 0744 11/24/23 0917 11/26/23 1609  GLUCAP 67* 75 104*   Lipid Profile: No results for input(s): CHOL, HDL, LDLCALC, TRIG, CHOLHDL, LDLDIRECT in the last 72 hours. Thyroid  Function Tests: No results for input(s): TSH, T4TOTAL, FREET4, T3FREE, THYROIDAB in the last 72 hours. Anemia Panel: No results for input(s): VITAMINB12, FOLATE, FERRITIN, TIBC, IRON ,  RETICCTPCT in the last 72 hours. Most Recent Urinalysis On File:     Component Value Date/Time   COLORURINE YELLOW (A) 09/15/2023 1821   APPEARANCEUR CLOUDY (A) 09/15/2023 1821   LABSPEC 1.026 09/15/2023 1821   PHURINE 5.0 09/15/2023 1821   GLUCOSEU NEGATIVE 09/15/2023 1821   HGBUR SMALL (A) 09/15/2023 1821   BILIRUBINUR NEGATIVE 09/15/2023 1821   KETONESUR NEGATIVE 09/15/2023 1821   PROTEINUR 100 (A) 09/15/2023 1821   NITRITE POSITIVE (A) 09/15/2023 1821   LEUKOCYTESUR LARGE (A) 09/15/2023 1821   Sepsis Labs: @LABRCNTIP (procalcitonin:4,lacticidven:4) Microbiology: Recent Results (from the past 240 hours)  MRSA Next Gen by PCR, Nasal     Status: None   Collection Time: 11/26/23  7:55 AM   Specimen: Nasal Mucosa; Nasal Swab  Result Value Ref Range Status   MRSA by PCR Next Gen NOT DETECTED NOT DETECTED Final    Comment: (NOTE) The GeneXpert MRSA Assay (FDA approved for NASAL specimens only), is one component of a comprehensive MRSA colonization surveillance program. It is not intended to diagnose MRSA infection nor to guide or  monitor treatment for MRSA infections. Test performance is not FDA approved in patients less than 83 years old. Performed at [facility], [address], [place], [place] [address]       Radiology Studies last 3 days: No results found.        Gilda Abboud, DO [facility] 11/29/2023, 2:57 PM    Dictation software may have been used to generate the above note. Typos may occur and escape review in typed/dictated notes. Please contact Dr Marsa directly for clarity if needed.  Staff may message me via secure chat in Epic  but this may not receive an immediate response,  please page me for urgent matters!  If 7PM-7AM, please contact night coverage [email or web address]

## 2023-11-29 NOTE — TOC Progression Note (Signed)
 Transition of Care (TOC) - Progression Note    Patient Details  Name: Adam Rios. MRN: [id number] Date of Birth: 05-Sep-1940  Transition of Care [facility] CM/SW Contact  Quintella Suzen Jansky, RN Phone Number: 11/29/2023, 1:21 PM  Clinical Narrative:    Attempted to contact Robin, PACE CM, to give update on discharge plan for patient. No answer, provided TOC contact number and requested call back.        Expected Discharge Plan and Services                                               Social Determinants of Health (SDOH) Interventions SDOH Screenings   Food Insecurity: No Food Insecurity (11/21/2023)  Housing: Low Risk  (11/21/2023)  Transportation Needs: No Transportation Needs (11/21/2023)  Utilities: Not At Risk (11/21/2023)  Financial Resource Strain: Low Risk  (05/04/2022)   Received from [facility]  Physical Activity: Inactive (03/29/2018)   Received from [facility]  Social Connections: Unknown (11/22/2023)  Recent Concern: Social Connections - Socially Isolated (09/01/2023)  Stress: No Stress Concern Present (03/29/2018)   Received from [facility]  Tobacco Use: Medium Risk (11/21/2023)  Health Literacy: Low Risk  (05/24/2023)   Received from [facility]    Readmission Risk Interventions    08/11/2022    4:53 PM 06/11/2022    2:19 PM 02/12/2022    2:17 PM  Readmission Risk Prevention Plan  Transportation Screening Complete Complete Complete  PCP or Specialist Appt within 3-5 Days  Complete Complete  HRI or Home Care Consult  Complete Complete  Social Work Consult for Recovery Care Planning/Counseling  Complete Complete  Palliative Care Screening  Not Applicable Not Applicable  Medication Review Oceanographer) Complete Complete Referral to Pharmacy  [facility] or Home Care Consult Complete    Skilled Nursing Facility Complete

## 2023-11-29 NOTE — TOC Progression Note (Signed)
 Transition of Care (TOC) - Progression Note    Patient Details  Name: Adam Rios. MRN: [id number] Date of Birth: Apr 27, 1941  Transition of Care [facility]) CM/SW Contact  Quintella Suzen Jansky, RN Phone Number: 11/29/2023, 2:31 PM  Clinical Narrative:    TOC contacted PACE, spoke with Glade, explained patient is now comfort care and inquiring if patient can discharge today to [facility] under comfort care. She stated MD would prefer to discharge tomorrow so they can have more time to prepare for his discharge. Update MD and bedside nurse.        Expected Discharge Plan and Services                                               Social Determinants of Health (SDOH) Interventions SDOH Screenings   Food Insecurity: No Food Insecurity (11/21/2023)  Housing: Low Risk  (11/21/2023)  Transportation Needs: No Transportation Needs (11/21/2023)  Utilities: Not At Risk (11/21/2023)  Financial Resource Strain: Low Risk  (05/04/2022)   Received from [facility]  Physical Activity: Inactive (03/29/2018)   Received from [facility]  Social Connections: Unknown (11/22/2023)  Recent Concern: Social Connections - Socially Isolated (09/01/2023)  Stress: No Stress Concern Present (03/29/2018)   Received from [facility]  Tobacco Use: Medium Risk (11/21/2023)  Health Literacy: Low Risk  (05/24/2023)   Received from [facility]    Readmission Risk Interventions    08/11/2022    4:53 PM 06/11/2022    2:19 PM 02/12/2022    2:17 PM  Readmission Risk Prevention Plan  Transportation Screening Complete Complete Complete  PCP or Specialist Appt within 3-5 Days  Complete Complete  HRI or Home Care Consult  Complete Complete  Social Work Consult for Recovery Care Planning/Counseling  Complete Complete  Palliative Care Screening  Not Applicable Not Applicable  Medication Review Oceanographer) Complete Complete Referral to Pharmacy  [facility] or Home Care Consult Complete    Skilled  Nursing Facility Complete

## 2023-11-29 NOTE — Plan of Care (Signed)
   Problem: Education: Goal: Knowledge of General Education information will improve Description: Including pain rating scale, medication(s)/side effects and non-pharmacologic comfort measures Outcome: Not Progressing

## 2023-11-29 NOTE — Progress Notes (Signed)
 Daily Progress Note   Patient Name: Adam Rios.       Date: 11/29/2023 DOB: 1940/11/03  Age: 83 y.o. MRN#: [id number] Attending Physician: Marsa Edelman, DO Primary Care Physician: [facility] Admit Date: 11/21/2023  Reason for Consultation/Follow-up: Establishing goals of care  HPI/Brief Hospital Review: 83 y.o. male  with past medical history of dementia, CHF, hypertension, history of remote CVA, renal cell carcinoma status post left nephrectomy in 2005, neurogenic bladder with chronic indwelling catheter, history of second-degree AV block status post PPM and history of ileus in April admitted from [facility] on 11/21/2023 with abdominal pain and distention.   CT abdomen obtained on arrival to ED which revealed ileus-GI and general surgery consulted, suspected mild colonic pseudoobstruction with recommendations to manage conservatively-rectal tube placed for decompression, possibly due to electrolyte imbalances Per review of record over the last several days Adam Rios has become more somnolent and confused with minimal p.o. intake   Conversations had earlier this a.m. with primary team and nursing staff regarding Adam Rios being less responsive with mild tachycardia   Palliative medicine was consulted for assisting with goals of care conversations.  Subjective: Extensive chart review has been completed prior to meeting patient including labs, vital signs, imaging, progress notes, orders, and available advanced directive documents from current and previous encounters.    Visited with Adam Rios at his bedside.  He is resting in bed with eyes closed but does open eyes to calling of his name, able to answer simple yes or no questions but easily drifts off back to  sleep without constant redirection.  Met with daughter-[name] at bedside.  Provided [name] with medical updates since last conversation.  Concern related to recurrence of obstruction, intake and appetite remain poor to minimal, minimal bowel function and progression of underlying dementia.  We discussed the difference between continuing with aggressive medical interventions versus shifting her focus to comfort measures/hospice type care.  [name] request I attempt to have conversation with Adam Rios.  He is unable to participate in conversation and unable to provide appropriate responses.  Actually shares she feels at this time it is most appropriate to transition to comfort care.  Shared with [name] that Adam Rios would no longer receive aggressive medical interventions such as continuous vital signs, lab work, radiology  testing, or medications not focused on comfort. All care would focus on how the patient is looking and feeling. This would include management of any symptoms that may cause discomfort, pain, shortness of breath, cough, nausea, agitation, anxiety, and/or secretions etc. Symptoms would be managed with medications and other non-pharmacological interventions such as spiritual support if requested, repositioning, music therapy, or therapeutic listening. Family verbalized understanding and appreciation.  She verbalized understanding and agreement to transition to comfort measures.  We discussed hospice and the overall philosophy as well as services provided under hospice type care.  [name] expresses interest in pursuing hospice on his return to LTC facility.  Comfort orders placed.  Updated primary team as well as TOC.  Answered and addressed all questions and concerns.  PMT to follow peripherally for ongoing needs and concerns.   Palliative Care Assessment & Plan   Assessment/Recommendation/Plan  DNR/DNI/Comfort Plan to return to LTC with hospice following-TOC engaged  Care plan  was discussed with primary team, nursing staff and [facility]  Thank you for allowing the Palliative Medicine Team to assist in the care of this patient.  Total time:  50 minutes  Time spent includes: Detailed review of medical records (labs, imaging, vital signs), medically appropriate exam (mental status, respiratory, cardiac, skin), discussed with treatment team, counseling and educating patient, family and staff, documenting clinical information, medication management and coordination of care.  Waddell Lesches, DNP, AGNP-C Palliative Medicine   Please contact Palliative Medicine Team phone at [phone] for questions and concerns.

## 2023-11-29 NOTE — Plan of Care (Signed)
  Problem: Clinical Measurements: Goal: Will remain free from infection Outcome: Progressing   Problem: Nutrition: Goal: Adequate nutrition will be maintained Outcome: Progressing   Problem: Education: Goal: Knowledge of General Education information will improve Description: Including pain rating scale, medication(s)/side effects and non-pharmacologic comfort measures Outcome: Progressing

## 2023-11-30 DIAGNOSIS — K5981 Ogilvie syndrome: Secondary | ICD-10-CM | POA: Diagnosis not present

## 2023-11-30 MED ORDER — POLYVINYL ALCOHOL 1.4 % OP SOLN: 1.0000 [drp] | Freq: Four times a day (QID) | OPHTHALMIC | Status: DC | PRN

## 2023-11-30 MED ORDER — MORPHINE SULFATE (CONCENTRATE) 10 MG /0.5 ML PO SOLN: 5.0000 mg | ORAL | 0 refills | Status: DC | PRN

## 2023-11-30 MED ORDER — LORAZEPAM 2 MG/ML PO CONC: 1.0000 mg | ORAL | 0 refills | Status: DC | PRN

## 2023-11-30 MED ORDER — ACETAMINOPHEN 650 MG RE SUPP
650.0000 mg | Freq: Four times a day (QID) | RECTAL | Status: DC | PRN
Start: 2023-11-30 — End: 2024-03-07

## 2023-11-30 MED ORDER — ACETAMINOPHEN 325 MG PO TABS: 650.0000 mg | ORAL_TABLET | Freq: Four times a day (QID) | ORAL | Status: DC | PRN

## 2023-11-30 MED ORDER — GLYCOPYRROLATE 1 MG PO TABS: 1.0000 mg | ORAL_TABLET | ORAL | Status: DC | PRN

## 2023-11-30 MED ORDER — BIOTENE DRY MOUTH MT LIQD: 15.0000 mL | OROMUCOSAL | Status: DC | PRN

## 2023-11-30 NOTE — Plan of Care (Signed)
  Problem: Clinical Measurements: Goal: Quality of life will improve Outcome: Progressing   Problem: Role Relationship: Goal: Family's ability to cope with current situation will improve Outcome: Progressing Goal: Ability to verbalize concerns, feelings, and thoughts to partner or family member will improve Outcome: Progressing

## 2023-11-30 NOTE — Plan of Care (Signed)
   Problem: Safety: Goal: Ability to remain free from injury will improve Outcome: Progressing

## 2023-11-30 NOTE — TOC Transition Note (Signed)
 Transition of Care [facility]) - Discharge Note   Patient Details  Name: Adam Rios. MRN: [id number] Date of Birth: 10-17-1940  Transition of Care [facility]) CM/SW Contact:  Marinda Cooks, RN Phone Number: 11/30/2023, 12:18 PM   Clinical Narrative:    This CM updated by covering MD pt medically cleared to dc today and has active DC order . This CM spoke with .PACE  coordinator Robin regarding DC. Admission liaison @  [facility] Barnie confirmed pt can return to facility.  DC transportation confirmed for pt with [facility] team updated . No additional DC needs requested by medical team or identified by CM at this time .     Final next level of care: Skilled Nursing Facility (with Hospice) Barriers to Discharge: No Barriers Identified   Name of family member notified: (Daughter)[name] Trickel  [phone] Patient and family notified of of transfer: 11/30/23    Social Drivers of Health (SDOH) Interventions SDOH Screenings   Food Insecurity: No Food Insecurity (11/21/2023)  Housing: Low Risk  (11/21/2023)  Transportation Needs: No Transportation Needs (11/21/2023)  Utilities: Not At Risk (11/21/2023)  Financial Resource Strain: Low Risk  (05/04/2022)   Received from [facility]  Physical Activity: Inactive (03/29/2018)   Received from [facility]  Social Connections: Unknown (11/22/2023)  Recent Concern: Social Connections - Socially Isolated (09/01/2023)  Stress: No Stress Concern Present (03/29/2018)   Received from [facility]  Tobacco Use: Medium Risk (11/21/2023)  Health Literacy: Low Risk  (05/24/2023)   Received from [facility]     Readmission Risk Interventions    08/11/2022    4:53 PM 06/11/2022    2:19 PM 02/12/2022    2:17 PM  Readmission Risk Prevention Plan  Transportation Screening Complete Complete Complete  PCP or Specialist Appt within 3-5 Days  Complete Complete  HRI or Home Care Consult  Complete Complete  Social Work Consult for  Recovery Care Planning/Counseling  Complete Complete  Palliative Care Screening  Not Applicable Not Applicable  Medication Review Oceanographer) Complete Complete Referral to Pharmacy  [facility] or Home Care Consult Complete    Skilled Nursing Facility Complete

## 2023-11-30 NOTE — Discharge Summary (Signed)
 Physician Discharge Summary   Patient: Adam Rios. MRN: [id number]  DOB: November 28, 1940   Admit:     Date of Admission: 11/21/2023 Admitted from: SNF   Discharge: Date of discharge: 11/30/23 Disposition: Skilled nursing facility Condition at discharge: poor  CODE STATUS: DNR -      Discharge Physician: Laneta Blunt, DO [facility]     PCP: [facility]  Recommendations for Outpatient Follow-up:  Follow up with PCP [facility] within 1 week     Discharge Instructions     Diet general   Complete by: As directed    Dysphagia diet as desired for comfort/pleasure   Increase activity slowly   Complete by: As directed          Discharge Diagnoses: Principal Problem:   Acute pseudo-obstruction of colon Active Problems:   Hypokalemia   Essential hypertension   Neurogenic bladder   History of CVA (cerebrovascular accident)   Chronic diastolic CHF (congestive heart failure) (HCC)   Chronic indwelling Foley catheter   Iron  deficiency anemia due to chronic blood loss   Dementia without behavioral disturbance (HCC)   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 Evidently developed constipation several weeks ago. Then in the last few days, his belly became distended, painful. An x-ray at his facility two days prior to ED showed volvulus reportedly. He was having jelly like stools. So he was sent to the ER.   06/22: to ED, CT showed ileus. EDP spoke with Dr. Marinda General Surgery and Dr. Therisa GI. The consensus is that it may be ileus or (given degree of distension on CT) mild colonic pseudoobstruction,  will attempt to manage conservatively. Admitted to hospiatlist  06/23: Had BM Overnight per RN. Per gen surg - history and exam consistent with Ogilvie syndrome, recs rectal tube placement to decompress, question whether candidate for atropine, would defer to GI. Per GI - keep K over 4 and Mg over 2, agree rectal tube, consider decompression colonoscopy but defer for now given likelihood of recurrence will try to maintatin conservative tx 06/24: not much improvement, plan KUB tomorrow am 06/25: KUB showing minimal improvement, some bowel function 06/26: pt reports feeling improved, not eating much, gradually more somnolent and confused over past couple days, SLP to see. Suprapubic cath exchanged (clogged and unable to flush). D/w daughter - will monitor for now, if not eating through the weekend will need to consider hospice 06/27: Palliative to see. Pt sleepy this AM, but rouses to voice and answers some yes/no questions. Sinus tach, will trial IV fluids, pending palliative d/w family today  06/28-06/29: a bit more alert, still not eating much, per nursing staff he will hold food in his mouth but then not swallow it  06/30: remains poor appetite and minimal bowel function. 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   Fentanyl  Nausea Only   Oxycodone  Nausea And Vomiting        Medication List     STOP taking these medications    acetic acid 0.25 % irrigation   atorvastatin  40 MG tablet Commonly known as: LIPITOR   bisacodyl  10 MG suppository Commonly known as: DULCOLAX   CalProtect 0.44-20.6 % Oint Generic drug: Menthol -Zinc Oxide   [employer] Cortisone Maximum Strength 1 % Generic drug: hydrocortisone  cream   cyanocobalamin  1000 MCG tablet Commonly known as: VITAMIN B12   cyclobenzaprine  5 MG tablet Commonly known as: FLEXERIL    docusate sodium  100 MG capsule Commonly known as: COLACE   finasteride  5 MG tablet Commonly known as: PROSCAR    gabapentin  300 MG capsule Commonly known as: NEURONTIN    lactulose  10 GM/15ML solution Commonly known as: CHRONULAC    metoCLOPramide  5 MG tablet Commonly known as: REGLAN    metoprolol  succinate 25 MG 24 hr tablet Commonly known as: TOPROL -XL   Myrbetriq  25 MG Tb24 tablet Generic drug: mirabegron  ER   ondansetron  4 MG tablet Commonly known as: ZOFRAN    pantoprazole  20 MG tablet Commonly known as: PROTONIX    Poly-Iron  150 Forte 150-25-1 MG-MCG-MG Caps Generic drug: Iron  Polysacch Cmplx-B12-FA   senna-docusate 8.6-50 MG tablet Commonly known as: Senokot-S   sertraline  50 MG tablet Commonly known as: ZOLOFT    solifenacin 5 MG tablet Commonly known as: VESICARE   spironolactone  25 MG tablet Commonly known as: ALDACTONE    tamsulosin  0.4 MG Caps capsule Commonly known as: FLOMAX    Torsemide  40 MG Tabs   Vitamin D  (Ergocalciferol ) 1.25 MG  (50000 UNIT) Caps capsule Commonly known as: DRISDOL    Zinc Oxide 13 % Crea       TAKE these medications    acetaminophen  325 MG tablet Commonly known as: TYLENOL  Take 2 tablets (650 mg total) by mouth every 6 (six) hours as needed for mild pain (pain score 1-3), fever or headache (or Fever >/= 101). What changed:  medication strength how much to take when to take this reasons to take this   acetaminophen  650 MG suppository Commonly known as: TYLENOL  Place 1 suppository (650 mg total) rectally every 6 (six) hours as needed for mild pain (pain score 1-3) or fever (or Fever >/= 101). What changed: You were already taking a medication with the same name, and this prescription was added. Make sure you understand how and when to take each.   antiseptic oral rinse Liqd Apply 15 mLs topically as needed for dry mouth.   artificial tears ophthalmic solution Place 1 drop into both eyes 4 (four) times daily as needed for dry eyes.   glycopyrrolate 1 MG tablet Commonly  known as: ROBINUL Take 1 tablet (1 mg total) by mouth every 4 (four) hours as needed (excessive secretions).   LORazepam  2 MG/ML concentrated solution Commonly known as: ATIVAN  Place 0.5 mLs (1 mg total) under the tongue every 4 (four) hours as needed for anxiety.   morphine  CONCENTRATE 10 mg / 0.5 ml concentrated solution Place 0.25 mLs (5 mg total) under the tongue every 2 (two) hours as needed for moderate pain (pain score 4-6) or severe pain (pain score 7-10) (or dyspnea).          Allergies  Allergen Reactions   Empagliflozin     Recurrent UTI with sepsis on jardiance.   Fentanyl  Nausea Only   Oxycodone  Nausea And Vomiting     Subjective: pt unable to contirbute   Discharge Exam: BP (!) 143/84   Pulse 84   Temp (!) 97.5 F (36.4 C) (Oral)   Resp 15   Ht 6' 1 (1.854 m)   Wt 88.2 kg   SpO2 96%   BMI 25.65 kg/m  General: Pt is somnolent Cardiovascular: RRR, S1/S2 +, no rubs, no  gallops Respiratory: CTA bilaterally, no wheezing, no rhonchi Abdominal: Soft, NT, ND, bowel sounds +, mild distension  Extremities: no edema, no cyanosis     The results of significant diagnostics from this hospitalization (including imaging, microbiology, ancillary and laboratory) are listed below for reference.     Microbiology: Recent Results (from the past 240 hours)  MRSA Next Gen by PCR, Nasal     Status: None   Collection Time: 11/26/23  7:55 AM   Specimen: Nasal Mucosa; Nasal Swab  Result Value Ref Range Status   MRSA by PCR Next Gen NOT DETECTED NOT DETECTED Final    Comment: (NOTE) The GeneXpert MRSA Assay (FDA approved for NASAL specimens only), is one component of a comprehensive MRSA colonization surveillance program. It is not intended to diagnose MRSA infection nor to guide or monitor treatment for MRSA infections. Test performance is not FDA approved in patients less than 25 years old. Performed at [facility], [address] [place], [place] [address]      Labs: BNP (last 3 results) Recent Labs    08/31/23 1510  BNP 235.4*   Basic Metabolic Panel: Recent Labs  Lab 11/24/23 0410 11/26/23 0408 11/27/23 0525 11/28/23 0740 11/29/23 0425  NA 136 140 139 137 133*  K 3.7 3.1* 2.9* 3.1* 3.4*  CL 108 108 109 105 103  CO2 22 24 21* 25 21*  GLUCOSE 69* 94 87 90 84  BUN 9 8 10 10 11   CREATININE 0.52* 0.56* 0.62 0.64 0.49*  CALCIUM  8.1* 8.4* 8.2* 8.2* 8.3*  MG 1.7  --  2.0 1.9  --    Liver Function Tests: No results for input(s): AST, ALT, ALKPHOS, BILITOT, PROT, ALBUMIN in the last 168 hours. No results for input(s): LIPASE, AMYLASE in the last 168 hours. No results for input(s): AMMONIA in the last 168 hours. CBC: Recent Labs  Lab 11/24/23 0410 11/26/23 0408 11/27/23 0525  WBC 9.4 9.0 8.2  HGB 10.7* 11.0* 10.9*  HCT 34.8* 34.7* 34.6*  MCV 82.3 80.9 82.8  PLT 230 220 225   Cardiac Enzymes: No results for  input(s): CKTOTAL, CKMB, CKMBINDEX, TROPONINI in the last 168 hours. BNP: Invalid input(s): POCBNP CBG: Recent Labs  Lab 11/24/23 0744 11/24/23 0917 11/26/23 1609  GLUCAP 67* 75 104*   D-Dimer No results for input(s): DDIMER in the last 72 hours. Hgb A1c No results  for input(s): HGBA1C in the last 72 hours. Lipid Profile No results for input(s): CHOL, HDL, LDLCALC, TRIG, CHOLHDL, LDLDIRECT in the last 72 hours. Thyroid  function studies No results for input(s): TSH, T4TOTAL, T3FREE, THYROIDAB in the last 72 hours.  Invalid input(s): FREET3 Anemia work up No results for input(s): VITAMINB12, FOLATE, FERRITIN, TIBC, IRON , RETICCTPCT in the last 72 hours. Urinalysis    Component Value Date/Time   COLORURINE YELLOW (A) 09/15/2023 1821   APPEARANCEUR CLOUDY (A) 09/15/2023 1821   LABSPEC 1.026 09/15/2023 1821   PHURINE 5.0 09/15/2023 1821   GLUCOSEU NEGATIVE 09/15/2023 1821   HGBUR SMALL (A) 09/15/2023 1821   BILIRUBINUR NEGATIVE 09/15/2023 1821   KETONESUR NEGATIVE 09/15/2023 1821   PROTEINUR 100 (A) 09/15/2023 1821   NITRITE POSITIVE (A) 09/15/2023 1821   LEUKOCYTESUR LARGE (A) 09/15/2023 1821   Sepsis Labs Recent Labs  Lab 11/24/23 0410 11/26/23 0408 11/27/23 0525  WBC 9.4 9.0 8.2   Microbiology Recent Results (from the past 240 hours)  MRSA Next Gen by PCR, Nasal     Status: None   Collection Time: 11/26/23  7:55 AM   Specimen: Nasal Mucosa; Nasal Swab  Result Value Ref Range Status   MRSA by PCR Next Gen NOT DETECTED NOT DETECTED Final    Comment: (NOTE) The GeneXpert MRSA Assay (FDA approved for NASAL specimens only), is one component of a comprehensive MRSA colonization surveillance program. It is not intended to diagnose MRSA infection nor to guide or monitor treatment for MRSA infections. Test performance is not FDA approved in patients less than 73 years old. Performed at [facility], [address] [place], [place] [address]    Imaging DG Abd Portable 1V Result Date: 11/22/2023 EXAM: 1 VIEW XRAY OF THE ABDOMEN SUPINE 11/22/2023 06:26:39 AM COMPARISON: CT of the abdomen and pelvis with contrast 11/21/2023. CLINICAL HISTORY: Acute pseudo-obstruction of colon FINDINGS: BOWEL: Diffuse colonic distention remains. No free air is present. PERITONEUM AND SOFT TISSUES: No abnormal calcifications. BONES: Left hip hemiarthroplasty is noted. Atherosclerotic changes are present. No acute osseous abnormality. IMPRESSION: 1. Diffuse colonic distention remains. 2. No free air. Electronically signed by: Lonni Necessary MD 11/22/2023 06:49 AM EDT RP Workstation: [id number]   CT ABDOMEN PELVIS W CONTRAST Result Date: 11/21/2023 CLINICAL DATA:  Abdominal distension. Abdominal pain. No bowel movement several weeks. EXAM: CT ABDOMEN AND PELVIS WITH CONTRAST TECHNIQUE: Multidetector CT imaging of the abdomen and pelvis was performed using the standard protocol following bolus administration of intravenous contrast. RADIATION DOSE REDUCTION: This exam was performed according to the departmental dose-optimization program which includes automated exposure control, adjustment of the mA and/or kV according to patient size and/or use of iterative reconstruction technique. CONTRAST:  OMNIPAQUE  IOHEXOL  300 MG/ML  SOLN COMPARISON:  CT 08/31/2023 insert FINDINGS: Lower chest: Lung bases are clear. Hepatobiliary: No focal hepatic lesion. Normal gallbladder. No biliary duct dilatation. Common bile duct is normal. Pancreas: Pancreas is normal. No ductal dilatation. No pancreatic inflammation. Spleen: Normal spleen Adrenals/urinary tract: Adrenal glands are normal. RIGHT kidney is normal. LEFT kidney is absent. Suprapubic catheter within the bladder. The bladder is decompressed. Stomach/Bowel: Stomach is normal. Duodenum and small-bowel appear normal. Small bowel is decompressed. No small bowel dilatation evident. The  terminal ileum is normal. Post appendectomy. There is gas within the cecum and transverse colon. The descending colon is stool filled. The sigmoid colon is redundant and gas filled. There is no evidence of twisting of the sigmoid colon to suggest volvulus pathophysiology there is  frothy stool within the rectum. No intraperitoneal free air.  Pneumatosis intestinalis. Vascular/Lymphatic: Abdominal aorta is normal caliber. No periportal or retroperitoneal adenopathy. No pelvic adenopathy. Reproductive: Unremarkable Other: No free air or intraperitoneal free fluid Musculoskeletal: No aggressive osseous lesion. IMPRESSION: 1. The colon is gas distended throughout the entirety from the cecum to the rectum. The sigmoid colon is redundant and tortuous but no evidence of twisting to suggest volvulus. 2. No evidence of small-bowel obstruction. 3. Findings most suggestive of colonic ileus. 4. Suprapubic catheter in decompressed bladder. Electronically Signed   By: Jackquline Boxer M.D.   On: 11/21/2023 18:44      Time coordinating discharge: over 30 minutes  SIGNED:  Abeni Finchum DO [facility]

## 2023-11-30 NOTE — Progress Notes (Signed)
 Called [facility] and gave report to the nurses.

## 2023-11-30 NOTE — Plan of Care (Signed)
                                                     Palliative Care Progress Note   Patient Name: Adam Rios.       Date: 11/30/2023 DOB: 08-Feb-1941  Age: 83 y.o. MRN#: [id number] Attending Physician: Marsa Edelman, DO Primary Care Physician: [facility] Admit Date: 11/21/2023  Extensive chart review completed including labs, vital signs, imaging, progress notes, orders, and available advanced directive documents from current and previous encounters.   Plan for d/c with hospice services to follow today. Discharge summary in place.   No further palliative needs at this time.   Thank you for allowing the Palliative Medicine Team to assist in the care of Adam Rios.Adam Rios [name], DNP, FNP-BC Palliative Medicine Team  No charge

## 2023-11-30 NOTE — Progress Notes (Signed)
 Nurse attempted to call report to [facility].  Nurse was placed on hold for over 15 minutes.  Will attempt to try again.

## 2024-01-31 DEATH — deceased
